# Patient Record
Sex: Male | Born: 1935
Health system: Southern US, Community
[De-identification: ages and names within clinical notes are randomized; demographics above are authoritative.]

## PROBLEM LIST (undated history)

## (undated) DIAGNOSIS — Z950 Presence of cardiac pacemaker: Secondary | ICD-10-CM

## (undated) DIAGNOSIS — E162 Hypoglycemia, unspecified: Secondary | ICD-10-CM

## (undated) DIAGNOSIS — N183 Chronic kidney disease, stage 3 (moderate): Secondary | ICD-10-CM

## (undated) DIAGNOSIS — D649 Anemia, unspecified: Secondary | ICD-10-CM

## (undated) DIAGNOSIS — K922 Gastrointestinal hemorrhage, unspecified: Secondary | ICD-10-CM

## (undated) DIAGNOSIS — I480 Paroxysmal atrial fibrillation: Secondary | ICD-10-CM

## (undated) DIAGNOSIS — C801 Malignant (primary) neoplasm, unspecified: Secondary | ICD-10-CM

## (undated) DIAGNOSIS — K219 Gastro-esophageal reflux disease without esophagitis: Secondary | ICD-10-CM

## (undated) DIAGNOSIS — C4490 Unspecified malignant neoplasm of skin, unspecified: Secondary | ICD-10-CM

## (undated) DIAGNOSIS — E538 Deficiency of other specified B group vitamins: Secondary | ICD-10-CM

## (undated) DIAGNOSIS — R2689 Other abnormalities of gait and mobility: Secondary | ICD-10-CM

## (undated) DIAGNOSIS — E039 Hypothyroidism, unspecified: Secondary | ICD-10-CM

## (undated) DIAGNOSIS — E785 Hyperlipidemia, unspecified: Secondary | ICD-10-CM

## (undated) DIAGNOSIS — Z8546 Personal history of malignant neoplasm of prostate: Secondary | ICD-10-CM

## (undated) DIAGNOSIS — K627 Radiation proctitis: Secondary | ICD-10-CM

## (undated) DIAGNOSIS — I499 Cardiac arrhythmia, unspecified: Secondary | ICD-10-CM

## (undated) DIAGNOSIS — K279 Peptic ulcer, site unspecified, unspecified as acute or chronic, without hemorrhage or perforation: Secondary | ICD-10-CM

## (undated) DIAGNOSIS — K5730 Diverticulosis of large intestine without perforation or abscess without bleeding: Secondary | ICD-10-CM

## (undated) DIAGNOSIS — H409 Unspecified glaucoma: Secondary | ICD-10-CM

## (undated) DIAGNOSIS — M47812 Spondylosis without myelopathy or radiculopathy, cervical region: Secondary | ICD-10-CM

## (undated) DIAGNOSIS — K52 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: Secondary | ICD-10-CM

## (undated) DIAGNOSIS — Z95810 Presence of automatic (implantable) cardiac defibrillator: Secondary | ICD-10-CM

## (undated) DIAGNOSIS — R600 Localized edema: Secondary | ICD-10-CM

## (undated) DIAGNOSIS — I495 Sick sinus syndrome: Secondary | ICD-10-CM

## (undated) DIAGNOSIS — I10 Essential (primary) hypertension: Secondary | ICD-10-CM

## (undated) DIAGNOSIS — I700 Atherosclerosis of aorta: Secondary | ICD-10-CM

## (undated) DIAGNOSIS — S72001A Fracture of unspecified part of neck of right femur, initial encounter for closed fracture: Secondary | ICD-10-CM

## (undated) HISTORY — PX: OTHER SURGICAL HISTORY: SHX169

## (undated) HISTORY — PX: CATARACT EXTRACTION: SUR2

## (undated) HISTORY — DX: Hyperlipidemia, unspecified: E78.5

## (undated) HISTORY — PX: SKIN CANCER DESTRUCTION: SHX778

## (undated) HISTORY — DX: Radiation proctitis: K62.7

## (undated) HISTORY — DX: Gastro-esophageal reflux disease without esophagitis: K21.9

## (undated) HISTORY — PX: CARDIAC CATHETERIZATION: SHX172

## (undated) HISTORY — DX: Deficiency of other specified B group vitamins: E53.8

## (undated) HISTORY — DX: Diverticulosis of large intestine without perforation or abscess without bleeding: K57.30

## (undated) HISTORY — DX: Presence of cardiac pacemaker: Z95.0

## (undated) HISTORY — PX: TOOTH EXTRACTION: SUR596

## (undated) HISTORY — DX: 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: K52.0

## (undated) HISTORY — DX: Hypothyroidism, unspecified: E03.9

## (undated) HISTORY — PX: CARDIAC ELECTROPHYSIOLOGY MAPPING AND ABLATION: SHX1292

## (undated) HISTORY — DX: Chronic kidney disease, stage 3 (moderate): N18.3

## (undated) HISTORY — PX: HERNIA REPAIR: SHX51

## (undated) HISTORY — PX: PROSTATE SURGERY: SHX751

## (undated) HISTORY — DX: Essential (primary) hypertension: I10

## (undated) HISTORY — DX: Spondylosis without myelopathy or radiculopathy, cervical region: M47.812

## (undated) HISTORY — DX: Peptic ulcer, site unspecified, unspecified as acute or chronic, without hemorrhage or perforation: K27.9

## (undated) HISTORY — DX: Gastrointestinal hemorrhage, unspecified: K92.2

## (undated) HISTORY — DX: Personal history of malignant neoplasm of prostate: Z85.46

## (undated) HISTORY — PX: NOSE SURGERY: SHX723

## (undated) HISTORY — DX: Sick sinus syndrome: I49.5

## (undated) HISTORY — DX: Unspecified glaucoma: H40.9

## (undated) HISTORY — DX: Fracture of unspecified part of neck of right femur, initial encounter for closed fracture: S72.001A

## (undated) HISTORY — DX: Paroxysmal atrial fibrillation: I48.0

---

## 1973-03-30 DIAGNOSIS — K279 Peptic ulcer, site unspecified, unspecified as acute or chronic, without hemorrhage or perforation: Secondary | ICD-10-CM

## 1973-03-30 HISTORY — DX: Peptic ulcer, site unspecified, unspecified as acute or chronic, without hemorrhage or perforation: K27.9

## 1999-06-21 ENCOUNTER — Encounter: Payer: Self-pay | Admitting: Emergency Medicine

## 1999-06-21 ENCOUNTER — Inpatient Hospital Stay (HOSPITAL_COMMUNITY): Admission: EM | Admit: 1999-06-21 | Discharge: 1999-06-23 | Payer: Self-pay | Admitting: *Deleted

## 1999-06-26 ENCOUNTER — Ambulatory Visit (HOSPITAL_COMMUNITY): Admission: RE | Admit: 1999-06-26 | Discharge: 1999-06-27 | Payer: Self-pay | Admitting: Psychiatry

## 1999-11-28 ENCOUNTER — Encounter: Admission: RE | Admit: 1999-11-28 | Discharge: 1999-11-28 | Payer: Self-pay | Admitting: Surgery

## 1999-11-28 ENCOUNTER — Encounter: Payer: Self-pay | Admitting: Surgery

## 1999-12-02 ENCOUNTER — Ambulatory Visit (HOSPITAL_BASED_OUTPATIENT_CLINIC_OR_DEPARTMENT_OTHER): Admission: RE | Admit: 1999-12-02 | Discharge: 1999-12-03 | Payer: Self-pay | Admitting: Surgery

## 1999-12-02 ENCOUNTER — Encounter (INDEPENDENT_AMBULATORY_CARE_PROVIDER_SITE_OTHER): Payer: Self-pay | Admitting: Specialist

## 2000-12-28 DIAGNOSIS — C61 Malignant neoplasm of prostate: Secondary | ICD-10-CM

## 2000-12-28 DIAGNOSIS — Z8546 Personal history of malignant neoplasm of prostate: Secondary | ICD-10-CM

## 2000-12-28 HISTORY — DX: Malignant neoplasm of prostate: C61

## 2000-12-28 HISTORY — DX: Personal history of malignant neoplasm of prostate: Z85.46

## 2001-07-06 ENCOUNTER — Ambulatory Visit (HOSPITAL_COMMUNITY): Admission: RE | Admit: 2001-07-06 | Discharge: 2001-07-07 | Payer: Self-pay | Admitting: Internal Medicine

## 2001-08-31 ENCOUNTER — Ambulatory Visit (HOSPITAL_COMMUNITY): Admission: RE | Admit: 2001-08-31 | Discharge: 2001-08-31 | Payer: Self-pay | Admitting: Gastroenterology

## 2001-08-31 ENCOUNTER — Encounter: Payer: Self-pay | Admitting: Gastroenterology

## 2002-04-17 ENCOUNTER — Encounter: Admission: RE | Admit: 2002-04-17 | Discharge: 2002-04-17 | Payer: Self-pay | Admitting: Surgery

## 2002-04-17 ENCOUNTER — Encounter: Payer: Self-pay | Admitting: Surgery

## 2002-04-18 ENCOUNTER — Ambulatory Visit (HOSPITAL_BASED_OUTPATIENT_CLINIC_OR_DEPARTMENT_OTHER): Admission: RE | Admit: 2002-04-18 | Discharge: 2002-04-19 | Payer: Self-pay | Admitting: Surgery

## 2002-05-17 ENCOUNTER — Encounter: Payer: Self-pay | Admitting: Surgery

## 2002-05-17 ENCOUNTER — Encounter: Admission: RE | Admit: 2002-05-17 | Discharge: 2002-05-17 | Payer: Self-pay | Admitting: Surgery

## 2002-06-13 ENCOUNTER — Encounter: Payer: Self-pay | Admitting: Internal Medicine

## 2002-06-13 ENCOUNTER — Inpatient Hospital Stay (HOSPITAL_COMMUNITY): Admission: EM | Admit: 2002-06-13 | Discharge: 2002-06-21 | Payer: Self-pay | Admitting: Emergency Medicine

## 2003-12-29 ENCOUNTER — Ambulatory Visit: Payer: Self-pay | Admitting: Radiation Oncology

## 2004-01-17 ENCOUNTER — Encounter (INDEPENDENT_AMBULATORY_CARE_PROVIDER_SITE_OTHER): Payer: Self-pay | Admitting: Specialist

## 2004-01-17 ENCOUNTER — Ambulatory Visit (HOSPITAL_COMMUNITY): Admission: RE | Admit: 2004-01-17 | Discharge: 2004-01-17 | Payer: Self-pay | Admitting: Surgery

## 2004-01-17 ENCOUNTER — Ambulatory Visit (HOSPITAL_BASED_OUTPATIENT_CLINIC_OR_DEPARTMENT_OTHER): Admission: RE | Admit: 2004-01-17 | Discharge: 2004-01-17 | Payer: Self-pay | Admitting: Surgery

## 2004-01-17 ENCOUNTER — Encounter: Admission: RE | Admit: 2004-01-17 | Discharge: 2004-01-17 | Payer: Self-pay | Admitting: Surgery

## 2004-02-04 ENCOUNTER — Ambulatory Visit: Payer: Self-pay | Admitting: Cardiology

## 2004-03-04 ENCOUNTER — Ambulatory Visit: Payer: Self-pay | Admitting: Cardiology

## 2004-03-25 ENCOUNTER — Ambulatory Visit: Payer: Self-pay | Admitting: Internal Medicine

## 2004-04-08 ENCOUNTER — Ambulatory Visit: Payer: Self-pay | Admitting: Internal Medicine

## 2004-04-22 ENCOUNTER — Ambulatory Visit: Payer: Self-pay | Admitting: Cardiology

## 2004-05-20 ENCOUNTER — Ambulatory Visit: Payer: Self-pay | Admitting: *Deleted

## 2004-06-17 ENCOUNTER — Ambulatory Visit: Payer: Self-pay | Admitting: Internal Medicine

## 2004-07-15 ENCOUNTER — Ambulatory Visit: Payer: Self-pay | Admitting: Cardiology

## 2004-08-05 ENCOUNTER — Encounter: Admission: RE | Admit: 2004-08-05 | Discharge: 2004-08-05 | Payer: Self-pay | Admitting: Surgery

## 2004-08-05 ENCOUNTER — Ambulatory Visit: Payer: Self-pay | Admitting: Cardiology

## 2004-09-02 ENCOUNTER — Ambulatory Visit: Payer: Self-pay | Admitting: *Deleted

## 2004-09-29 ENCOUNTER — Observation Stay (HOSPITAL_COMMUNITY): Admission: RE | Admit: 2004-09-29 | Discharge: 2004-09-30 | Payer: Self-pay | Admitting: Surgery

## 2004-09-29 ENCOUNTER — Encounter (INDEPENDENT_AMBULATORY_CARE_PROVIDER_SITE_OTHER): Payer: Self-pay | Admitting: *Deleted

## 2004-10-10 ENCOUNTER — Ambulatory Visit: Payer: Self-pay | Admitting: Cardiology

## 2004-10-20 ENCOUNTER — Ambulatory Visit: Payer: Self-pay | Admitting: Cardiology

## 2004-11-03 ENCOUNTER — Ambulatory Visit: Payer: Self-pay | Admitting: Cardiology

## 2004-11-17 ENCOUNTER — Ambulatory Visit: Payer: Self-pay | Admitting: Cardiology

## 2004-11-18 ENCOUNTER — Encounter: Admission: RE | Admit: 2004-11-18 | Discharge: 2004-11-18 | Payer: Self-pay | Admitting: Family Medicine

## 2004-12-08 ENCOUNTER — Ambulatory Visit: Payer: Self-pay | Admitting: Internal Medicine

## 2004-12-12 ENCOUNTER — Ambulatory Visit: Payer: Self-pay | Admitting: Radiation Oncology

## 2004-12-28 ENCOUNTER — Ambulatory Visit: Payer: Self-pay | Admitting: Radiation Oncology

## 2004-12-29 ENCOUNTER — Ambulatory Visit: Payer: Self-pay | Admitting: Cardiology

## 2005-01-19 ENCOUNTER — Ambulatory Visit: Payer: Self-pay | Admitting: Internal Medicine

## 2005-02-02 ENCOUNTER — Ambulatory Visit: Payer: Self-pay | Admitting: Internal Medicine

## 2005-02-05 ENCOUNTER — Ambulatory Visit: Payer: Self-pay | Admitting: Gastroenterology

## 2005-02-16 ENCOUNTER — Ambulatory Visit: Payer: Self-pay | Admitting: Cardiology

## 2005-03-02 ENCOUNTER — Ambulatory Visit: Payer: Self-pay | Admitting: Cardiology

## 2005-03-25 ENCOUNTER — Ambulatory Visit: Payer: Self-pay | Admitting: *Deleted

## 2005-04-22 ENCOUNTER — Ambulatory Visit: Payer: Self-pay | Admitting: Cardiology

## 2005-05-06 ENCOUNTER — Ambulatory Visit: Payer: Self-pay | Admitting: Internal Medicine

## 2005-05-20 ENCOUNTER — Ambulatory Visit: Payer: Self-pay | Admitting: Cardiology

## 2005-06-17 ENCOUNTER — Ambulatory Visit: Payer: Self-pay | Admitting: Cardiology

## 2005-07-15 ENCOUNTER — Ambulatory Visit: Payer: Self-pay | Admitting: *Deleted

## 2005-07-28 ENCOUNTER — Encounter: Admission: RE | Admit: 2005-07-28 | Discharge: 2005-07-28 | Payer: Self-pay | Admitting: Surgery

## 2005-07-28 DIAGNOSIS — Z950 Presence of cardiac pacemaker: Secondary | ICD-10-CM

## 2005-07-28 HISTORY — PX: INSERT / REPLACE / REMOVE PACEMAKER: SUR710

## 2005-07-28 HISTORY — DX: Presence of cardiac pacemaker: Z95.0

## 2005-07-29 ENCOUNTER — Encounter (INDEPENDENT_AMBULATORY_CARE_PROVIDER_SITE_OTHER): Payer: Self-pay | Admitting: *Deleted

## 2005-07-29 ENCOUNTER — Ambulatory Visit (HOSPITAL_BASED_OUTPATIENT_CLINIC_OR_DEPARTMENT_OTHER): Admission: RE | Admit: 2005-07-29 | Discharge: 2005-07-29 | Payer: Self-pay | Admitting: Surgery

## 2005-08-05 ENCOUNTER — Ambulatory Visit: Payer: Self-pay | Admitting: Cardiology

## 2005-08-13 ENCOUNTER — Ambulatory Visit: Payer: Self-pay | Admitting: Cardiology

## 2005-08-19 ENCOUNTER — Inpatient Hospital Stay (HOSPITAL_COMMUNITY): Admission: EM | Admit: 2005-08-19 | Discharge: 2005-08-22 | Payer: Self-pay | Admitting: Emergency Medicine

## 2005-08-19 ENCOUNTER — Ambulatory Visit: Payer: Self-pay | Admitting: Cardiology

## 2005-08-20 ENCOUNTER — Encounter: Payer: Self-pay | Admitting: Internal Medicine

## 2005-09-01 ENCOUNTER — Ambulatory Visit: Payer: Self-pay | Admitting: Internal Medicine

## 2005-09-09 ENCOUNTER — Ambulatory Visit: Payer: Self-pay

## 2005-09-09 ENCOUNTER — Ambulatory Visit: Payer: Self-pay | Admitting: Cardiology

## 2005-09-23 ENCOUNTER — Ambulatory Visit: Payer: Self-pay | Admitting: Internal Medicine

## 2005-10-05 ENCOUNTER — Ambulatory Visit: Payer: Self-pay | Admitting: Cardiology

## 2005-10-26 ENCOUNTER — Ambulatory Visit: Payer: Self-pay | Admitting: Cardiovascular Disease

## 2005-11-02 ENCOUNTER — Ambulatory Visit: Payer: Self-pay | Admitting: Cardiology

## 2005-11-23 ENCOUNTER — Ambulatory Visit: Payer: Self-pay | Admitting: Cardiology

## 2005-12-11 ENCOUNTER — Ambulatory Visit: Payer: Self-pay | Admitting: Radiation Oncology

## 2005-12-21 ENCOUNTER — Ambulatory Visit: Payer: Self-pay | Admitting: Cardiology

## 2005-12-28 ENCOUNTER — Ambulatory Visit: Payer: Self-pay | Admitting: Radiation Oncology

## 2006-01-18 ENCOUNTER — Ambulatory Visit: Payer: Self-pay | Admitting: Cardiovascular Disease

## 2006-02-15 ENCOUNTER — Ambulatory Visit: Payer: Self-pay | Admitting: Cardiology

## 2006-03-15 ENCOUNTER — Ambulatory Visit: Payer: Self-pay | Admitting: Cardiology

## 2006-04-12 ENCOUNTER — Ambulatory Visit: Payer: Self-pay | Admitting: Cardiology

## 2006-05-10 ENCOUNTER — Ambulatory Visit: Payer: Self-pay | Admitting: Internal Medicine

## 2006-05-10 ENCOUNTER — Ambulatory Visit: Payer: Self-pay | Admitting: Cardiology

## 2006-06-07 ENCOUNTER — Ambulatory Visit: Payer: Self-pay | Admitting: Cardiology

## 2006-06-08 ENCOUNTER — Ambulatory Visit: Payer: Self-pay | Admitting: Gastroenterology

## 2006-07-05 ENCOUNTER — Ambulatory Visit: Payer: Self-pay | Admitting: Cardiology

## 2006-08-03 ENCOUNTER — Ambulatory Visit: Payer: Self-pay | Admitting: Cardiology

## 2006-08-31 ENCOUNTER — Ambulatory Visit: Payer: Self-pay | Admitting: Cardiology

## 2006-09-30 ENCOUNTER — Ambulatory Visit: Payer: Self-pay | Admitting: Internal Medicine

## 2006-11-04 ENCOUNTER — Ambulatory Visit: Payer: Self-pay | Admitting: Cardiovascular Disease

## 2006-11-04 ENCOUNTER — Ambulatory Visit: Payer: Self-pay | Admitting: Internal Medicine

## 2006-12-02 ENCOUNTER — Ambulatory Visit: Payer: Self-pay | Admitting: Cardiology

## 2006-12-16 ENCOUNTER — Ambulatory Visit: Payer: Self-pay | Admitting: Radiation Oncology

## 2006-12-29 ENCOUNTER — Ambulatory Visit: Payer: Self-pay | Admitting: Radiation Oncology

## 2006-12-30 ENCOUNTER — Ambulatory Visit: Payer: Self-pay | Admitting: Cardiology

## 2007-01-27 ENCOUNTER — Ambulatory Visit: Payer: Self-pay | Admitting: Internal Medicine

## 2007-02-16 ENCOUNTER — Ambulatory Visit: Payer: Self-pay

## 2007-03-16 ENCOUNTER — Ambulatory Visit: Payer: Self-pay | Admitting: Internal Medicine

## 2007-03-29 ENCOUNTER — Ambulatory Visit: Payer: Self-pay | Admitting: Cardiology

## 2007-04-08 ENCOUNTER — Ambulatory Visit: Payer: Self-pay | Admitting: Cardiology

## 2007-04-22 ENCOUNTER — Ambulatory Visit: Payer: Self-pay | Admitting: Internal Medicine

## 2007-05-23 ENCOUNTER — Ambulatory Visit: Payer: Self-pay | Admitting: Internal Medicine

## 2007-06-20 ENCOUNTER — Ambulatory Visit: Payer: Self-pay | Admitting: Cardiology

## 2007-07-07 ENCOUNTER — Ambulatory Visit: Payer: Self-pay | Admitting: Gastroenterology

## 2007-07-07 LAB — CONVERTED CEMR LAB
ALT: 27 units/L (ref 0–53)
AST: 28 units/L (ref 0–37)
Basophils Absolute: 0 10*3/uL (ref 0.0–0.1)
Basophils Relative: 0.1 % (ref 0.0–1.0)
Bilirubin, Direct: 0.2 mg/dL (ref 0.0–0.3)
CO2: 33 meq/L — ABNORMAL HIGH (ref 19–32)
Eosinophils Absolute: 0.2 10*3/uL (ref 0.0–0.7)
Folate: 8.3 ng/mL
Glucose, Bld: 76 mg/dL (ref 70–99)
Lymphocytes Relative: 19.9 % (ref 12.0–46.0)
MCV: 100.5 fL — ABNORMAL HIGH (ref 78.0–100.0)
Monocytes Absolute: 0.6 10*3/uL (ref 0.1–1.0)
Monocytes Relative: 11.1 % (ref 3.0–12.0)
Neutro Abs: 3.8 10*3/uL (ref 1.4–7.7)
Potassium: 4.8 meq/L (ref 3.5–5.1)
RDW: 13.2 % (ref 11.5–14.6)
Saturation Ratios: 25.6 % (ref 20.0–50.0)
Sodium: 142 meq/L (ref 135–145)
Total Protein: 6.5 g/dL (ref 6.0–8.3)
Vitamin B-12: 216 pg/mL (ref 211–911)

## 2007-07-18 ENCOUNTER — Ambulatory Visit: Payer: Self-pay | Admitting: Cardiology

## 2007-08-02 ENCOUNTER — Ambulatory Visit: Payer: Self-pay

## 2007-08-17 ENCOUNTER — Ambulatory Visit: Payer: Self-pay | Admitting: Cardiology

## 2007-09-19 ENCOUNTER — Ambulatory Visit: Payer: Self-pay | Admitting: Cardiology

## 2007-09-29 ENCOUNTER — Emergency Department: Payer: Self-pay | Admitting: Emergency Medicine

## 2007-10-20 ENCOUNTER — Ambulatory Visit: Payer: Self-pay | Admitting: Cardiology

## 2007-10-28 ENCOUNTER — Ambulatory Visit: Payer: Self-pay | Admitting: Internal Medicine

## 2007-10-29 DIAGNOSIS — C44101 Unspecified malignant neoplasm of skin of unspecified eyelid, including canthus: Secondary | ICD-10-CM

## 2007-10-29 HISTORY — DX: Unspecified malignant neoplasm of skin of unspecified eyelid, including canthus: C44.101

## 2007-11-04 ENCOUNTER — Ambulatory Visit: Payer: Self-pay | Admitting: Internal Medicine

## 2007-11-16 ENCOUNTER — Encounter: Payer: Self-pay | Admitting: Family Medicine

## 2007-11-18 ENCOUNTER — Ambulatory Visit: Payer: Self-pay | Admitting: Cardiovascular Disease

## 2007-11-21 ENCOUNTER — Ambulatory Visit: Payer: Self-pay | Admitting: Cardiology

## 2007-12-01 ENCOUNTER — Ambulatory Visit: Payer: Self-pay | Admitting: Family Medicine

## 2007-12-01 DIAGNOSIS — K469 Unspecified abdominal hernia without obstruction or gangrene: Secondary | ICD-10-CM | POA: Insufficient documentation

## 2007-12-01 DIAGNOSIS — E039 Hypothyroidism, unspecified: Secondary | ICD-10-CM | POA: Insufficient documentation

## 2007-12-06 LAB — CONVERTED CEMR LAB
BUN: 20 mg/dL (ref 6–23)
Chloride: 107 meq/L (ref 96–112)
GFR calc Af Amer: 75 mL/min
GFR calc non Af Amer: 62 mL/min
Glucose, Bld: 83 mg/dL (ref 70–99)
Potassium: 5.5 meq/L — ABNORMAL HIGH (ref 3.5–5.1)

## 2007-12-08 ENCOUNTER — Ambulatory Visit: Payer: Self-pay | Admitting: Family Medicine

## 2007-12-08 LAB — CONVERTED CEMR LAB
BUN: 13 mg/dL (ref 6–23)
Chloride: 107 meq/L (ref 96–112)
Creatinine, Ser: 1.2 mg/dL (ref 0.4–1.5)
GFR calc non Af Amer: 62 mL/min
Glucose, Bld: 94 mg/dL (ref 70–99)

## 2007-12-09 ENCOUNTER — Ambulatory Visit: Payer: Self-pay | Admitting: Cardiology

## 2007-12-30 ENCOUNTER — Ambulatory Visit: Payer: Self-pay | Admitting: Cardiovascular Disease

## 2008-01-06 ENCOUNTER — Ambulatory Visit: Payer: Self-pay | Admitting: Cardiology

## 2008-02-03 ENCOUNTER — Ambulatory Visit: Payer: Self-pay | Admitting: Cardiology

## 2008-02-06 ENCOUNTER — Ambulatory Visit: Payer: Self-pay | Admitting: Family Medicine

## 2008-02-06 DIAGNOSIS — I480 Paroxysmal atrial fibrillation: Secondary | ICD-10-CM | POA: Insufficient documentation

## 2008-02-06 DIAGNOSIS — I4891 Unspecified atrial fibrillation: Secondary | ICD-10-CM | POA: Insufficient documentation

## 2008-02-06 DIAGNOSIS — E785 Hyperlipidemia, unspecified: Secondary | ICD-10-CM | POA: Insufficient documentation

## 2008-02-06 LAB — CONVERTED CEMR LAB
LDL Cholesterol: 60 mg/dL (ref 0–99)
Triglycerides: 41 mg/dL (ref 0–149)

## 2008-03-02 ENCOUNTER — Ambulatory Visit: Payer: Self-pay | Admitting: Cardiology

## 2008-03-29 ENCOUNTER — Ambulatory Visit: Payer: Self-pay | Admitting: Cardiology

## 2008-04-02 ENCOUNTER — Ambulatory Visit: Payer: Self-pay | Admitting: Internal Medicine

## 2008-04-12 ENCOUNTER — Ambulatory Visit: Payer: Self-pay | Admitting: Internal Medicine

## 2008-05-09 ENCOUNTER — Ambulatory Visit: Payer: Self-pay | Admitting: Family Medicine

## 2008-05-09 DIAGNOSIS — I10 Essential (primary) hypertension: Secondary | ICD-10-CM | POA: Insufficient documentation

## 2008-05-10 ENCOUNTER — Ambulatory Visit: Payer: Self-pay | Admitting: Internal Medicine

## 2008-05-10 ENCOUNTER — Encounter (INDEPENDENT_AMBULATORY_CARE_PROVIDER_SITE_OTHER): Payer: Self-pay | Admitting: *Deleted

## 2008-05-10 LAB — CONVERTED CEMR LAB
ALT: 25 units/L (ref 0–53)
AST: 31 units/L (ref 0–37)
Alkaline Phosphatase: 103 units/L (ref 39–117)
Basophils Relative: 0.1 % (ref 0.0–3.0)
Bilirubin, Direct: 0.1 mg/dL (ref 0.0–0.3)
Calcium: 8.8 mg/dL (ref 8.4–10.5)
Chloride: 111 meq/L (ref 96–112)
Creatinine, Ser: 1.2 mg/dL (ref 0.4–1.5)
GFR calc Af Amer: 74 mL/min
Glucose, Bld: 98 mg/dL (ref 70–99)
Hemoglobin: 12.8 g/dL — ABNORMAL LOW (ref 13.0–17.0)
Lymphocytes Relative: 19.2 % (ref 12.0–46.0)
Monocytes Absolute: 0.6 10*3/uL (ref 0.1–1.0)
Neutro Abs: 4.1 10*3/uL (ref 1.4–7.7)
Neutrophils Relative %: 68.1 % (ref 43.0–77.0)
Platelets: 209 10*3/uL (ref 150–400)
Potassium: 4.6 meq/L (ref 3.5–5.1)
RBC: 3.77 M/uL — ABNORMAL LOW (ref 4.22–5.81)
Sodium: 143 meq/L (ref 135–145)

## 2008-06-06 ENCOUNTER — Ambulatory Visit: Payer: Self-pay | Admitting: Internal Medicine

## 2008-06-14 ENCOUNTER — Telehealth: Payer: Self-pay | Admitting: Family Medicine

## 2008-06-15 ENCOUNTER — Ambulatory Visit: Payer: Self-pay | Admitting: Cardiology

## 2008-06-18 ENCOUNTER — Encounter: Payer: Self-pay | Admitting: Family Medicine

## 2008-07-13 ENCOUNTER — Ambulatory Visit: Payer: Self-pay | Admitting: Internal Medicine

## 2008-07-13 ENCOUNTER — Encounter (INDEPENDENT_AMBULATORY_CARE_PROVIDER_SITE_OTHER): Payer: Self-pay | Admitting: *Deleted

## 2008-07-18 DIAGNOSIS — K52 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: Secondary | ICD-10-CM | POA: Insufficient documentation

## 2008-07-18 DIAGNOSIS — K5730 Diverticulosis of large intestine without perforation or abscess without bleeding: Secondary | ICD-10-CM | POA: Insufficient documentation

## 2008-07-18 DIAGNOSIS — K279 Peptic ulcer, site unspecified, unspecified as acute or chronic, without hemorrhage or perforation: Secondary | ICD-10-CM | POA: Insufficient documentation

## 2008-07-18 DIAGNOSIS — C61 Malignant neoplasm of prostate: Secondary | ICD-10-CM | POA: Insufficient documentation

## 2008-07-19 ENCOUNTER — Ambulatory Visit: Payer: Self-pay | Admitting: Gastroenterology

## 2008-07-19 DIAGNOSIS — E538 Deficiency of other specified B group vitamins: Secondary | ICD-10-CM | POA: Insufficient documentation

## 2008-07-20 ENCOUNTER — Encounter: Payer: Self-pay | Admitting: Gastroenterology

## 2008-07-20 LAB — CONVERTED CEMR LAB
Glucose, Bld: 92 mg/dL (ref 70–99)
Hgb A1c MFr Bld: 6.2 % (ref 4.6–6.5)

## 2008-07-27 ENCOUNTER — Ambulatory Visit: Payer: Self-pay | Admitting: Gastroenterology

## 2008-08-03 ENCOUNTER — Ambulatory Visit: Payer: Self-pay | Admitting: Gastroenterology

## 2008-08-06 ENCOUNTER — Encounter: Payer: Self-pay | Admitting: Gastroenterology

## 2008-08-07 DIAGNOSIS — Z950 Presence of cardiac pacemaker: Secondary | ICD-10-CM | POA: Insufficient documentation

## 2008-08-07 DIAGNOSIS — K219 Gastro-esophageal reflux disease without esophagitis: Secondary | ICD-10-CM | POA: Insufficient documentation

## 2008-08-07 DIAGNOSIS — H409 Unspecified glaucoma: Secondary | ICD-10-CM | POA: Insufficient documentation

## 2008-08-08 ENCOUNTER — Ambulatory Visit: Payer: Self-pay | Admitting: Cardiology

## 2008-08-30 ENCOUNTER — Ambulatory Visit: Payer: Self-pay | Admitting: Internal Medicine

## 2008-09-10 ENCOUNTER — Ambulatory Visit: Payer: Self-pay | Admitting: Family Medicine

## 2008-09-10 LAB — CONVERTED CEMR LAB
HDL goal, serum: 40 mg/dL
LDL Goal: 160 mg/dL

## 2008-09-11 LAB — CONVERTED CEMR LAB
Albumin: 3.5 g/dL (ref 3.5–5.2)
Basophils Absolute: 0 10*3/uL (ref 0.0–0.1)
CO2: 30 meq/L (ref 19–32)
Calcium: 8.8 mg/dL (ref 8.4–10.5)
Creatinine, Ser: 1.3 mg/dL (ref 0.4–1.5)
Eosinophils Relative: 2.1 % (ref 0.0–5.0)
Folate: 10.6 ng/mL
Free T4: 1.2 ng/dL (ref 0.6–1.6)
GFR calc non Af Amer: 55.97 mL/min (ref >60)
Glucose, Bld: 93 mg/dL (ref 70–99)
Hemoglobin: 12.3 g/dL — ABNORMAL LOW (ref 13.0–17.0)
MCV: 97.4 fL (ref 78.0–100.0)
Potassium: 4.7 meq/L (ref 3.5–5.1)
RBC: 3.66 M/uL — ABNORMAL LOW (ref 4.22–5.81)
RDW: 12.8 % (ref 11.5–14.6)
T4, Total: 8.4 ug/dL (ref 5.0–12.5)
TSH: 1.41 microintl units/mL (ref 0.35–5.50)
Total Bilirubin: 1.3 mg/dL — ABNORMAL HIGH (ref 0.3–1.2)
Total CHOL/HDL Ratio: 2
Total Protein: 6.6 g/dL (ref 6.0–8.3)
Triglycerides: 54 mg/dL (ref 0.0–149.0)
Vitamin B-12: 337 pg/mL (ref 211–911)
WBC: 7.1 10*3/uL (ref 4.5–10.5)

## 2008-09-12 ENCOUNTER — Encounter (INDEPENDENT_AMBULATORY_CARE_PROVIDER_SITE_OTHER): Payer: Self-pay | Admitting: *Deleted

## 2008-09-20 ENCOUNTER — Ambulatory Visit: Payer: Self-pay | Admitting: Cardiovascular Disease

## 2008-10-08 ENCOUNTER — Encounter: Payer: Self-pay | Admitting: Internal Medicine

## 2008-10-08 ENCOUNTER — Ambulatory Visit: Payer: Self-pay | Admitting: Internal Medicine

## 2008-10-11 ENCOUNTER — Ambulatory Visit: Payer: Self-pay | Admitting: Cardiology

## 2008-10-11 ENCOUNTER — Ambulatory Visit: Payer: Self-pay | Admitting: Family Medicine

## 2008-11-08 ENCOUNTER — Ambulatory Visit: Payer: Self-pay | Admitting: Cardiovascular Disease

## 2008-11-12 ENCOUNTER — Ambulatory Visit: Payer: Self-pay | Admitting: Family Medicine

## 2008-11-12 ENCOUNTER — Encounter: Payer: Self-pay | Admitting: *Deleted

## 2008-12-05 ENCOUNTER — Ambulatory Visit: Payer: Self-pay | Admitting: Cardiology

## 2008-12-05 LAB — CONVERTED CEMR LAB: POC INR: 2.7

## 2008-12-07 LAB — CONVERTED CEMR LAB: INR: 2.7 — ABNORMAL HIGH (ref 0.0–1.5)

## 2008-12-13 ENCOUNTER — Ambulatory Visit: Payer: Self-pay | Admitting: Family Medicine

## 2009-01-02 ENCOUNTER — Ambulatory Visit: Payer: Self-pay | Admitting: Cardiovascular Disease

## 2009-01-15 ENCOUNTER — Ambulatory Visit: Payer: Self-pay | Admitting: Family Medicine

## 2009-01-17 ENCOUNTER — Ambulatory Visit: Payer: Self-pay | Admitting: Internal Medicine

## 2009-01-17 LAB — CONVERTED CEMR LAB: POC INR: 2.9

## 2009-02-14 ENCOUNTER — Ambulatory Visit: Payer: Self-pay | Admitting: Cardiology

## 2009-02-15 ENCOUNTER — Ambulatory Visit: Payer: Self-pay | Admitting: Family Medicine

## 2009-03-05 ENCOUNTER — Ambulatory Visit: Payer: Self-pay | Admitting: Family Medicine

## 2009-03-06 LAB — CONVERTED CEMR LAB
CO2: 30 meq/L (ref 19–32)
Calcium: 9.2 mg/dL (ref 8.4–10.5)
Chloride: 110 meq/L (ref 96–112)
Eosinophils Relative: 3 % (ref 0.0–5.0)
Free T4: 1.2 ng/dL (ref 0.6–1.6)
GFR calc non Af Amer: 55.91 mL/min (ref >60)
HCT: 39.2 % (ref 39.0–52.0)
HDL: 74.1 mg/dL (ref >39.00)
Hemoglobin: 13.1 g/dL (ref 13.0–17.0)
LDL Cholesterol: 80 mg/dL (ref 0–99)
Lymphocytes Relative: 22.6 % (ref 12.0–46.0)
Lymphs Abs: 1.2 10*3/uL (ref 0.7–4.0)
MCHC: 33.5 g/dL (ref 30.0–36.0)
Monocytes Relative: 10.2 % (ref 3.0–12.0)
Potassium: 4.5 meq/L (ref 3.5–5.1)
RBC: 3.84 M/uL — ABNORMAL LOW (ref 4.22–5.81)
RDW: 13 % (ref 11.5–14.6)
TSH: 2.33 microintl units/mL (ref 0.35–5.50)
Total Bilirubin: 1 mg/dL (ref 0.3–1.2)
Total CHOL/HDL Ratio: 2
Triglycerides: 50 mg/dL (ref 0.0–149.0)
VLDL: 10 mg/dL (ref 0.0–40.0)
Vitamin B-12: 439 pg/mL (ref 211–911)
WBC: 5.5 10*3/uL (ref 4.5–10.5)

## 2009-03-13 ENCOUNTER — Ambulatory Visit: Payer: Self-pay | Admitting: Family Medicine

## 2009-03-18 ENCOUNTER — Ambulatory Visit: Payer: Self-pay | Admitting: Cardiovascular Disease

## 2009-03-27 ENCOUNTER — Ambulatory Visit: Payer: Self-pay | Admitting: Family Medicine

## 2009-04-08 ENCOUNTER — Ambulatory Visit: Payer: Self-pay | Admitting: Internal Medicine

## 2009-04-30 ENCOUNTER — Ambulatory Visit: Payer: Self-pay | Admitting: Family Medicine

## 2009-05-06 ENCOUNTER — Ambulatory Visit: Payer: Self-pay | Admitting: Internal Medicine

## 2009-05-06 LAB — CONVERTED CEMR LAB: POC INR: 3

## 2009-05-31 ENCOUNTER — Ambulatory Visit: Payer: Self-pay | Admitting: Family Medicine

## 2009-06-03 ENCOUNTER — Ambulatory Visit: Payer: Self-pay | Admitting: Cardiovascular Disease

## 2009-06-18 ENCOUNTER — Encounter: Payer: Self-pay | Admitting: Family Medicine

## 2009-06-18 ENCOUNTER — Telehealth (INDEPENDENT_AMBULATORY_CARE_PROVIDER_SITE_OTHER): Payer: Self-pay | Admitting: *Deleted

## 2009-07-01 ENCOUNTER — Encounter: Payer: Self-pay | Admitting: Cardiovascular Disease

## 2009-07-01 ENCOUNTER — Ambulatory Visit: Payer: Self-pay | Admitting: Cardiovascular Disease

## 2009-07-01 LAB — CONVERTED CEMR LAB: POC INR: 4.2

## 2009-07-02 ENCOUNTER — Ambulatory Visit: Payer: Self-pay | Admitting: Family Medicine

## 2009-07-16 ENCOUNTER — Ambulatory Visit: Payer: Self-pay | Admitting: Internal Medicine

## 2009-08-01 ENCOUNTER — Ambulatory Visit: Payer: Self-pay | Admitting: Family Medicine

## 2009-08-13 ENCOUNTER — Ambulatory Visit: Payer: Self-pay | Admitting: Cardiovascular Disease

## 2009-09-04 ENCOUNTER — Ambulatory Visit: Payer: Self-pay | Admitting: Family Medicine

## 2009-09-05 LAB — CONVERTED CEMR LAB
BUN: 18 mg/dL (ref 6–23)
Basophils Relative: 0.4 % (ref 0.0–3.0)
CO2: 28 meq/L (ref 19–32)
Creatinine, Ser: 1.2 mg/dL (ref 0.4–1.5)
Eosinophils Absolute: 0.2 10*3/uL (ref 0.0–0.7)
Eosinophils Relative: 3.5 % (ref 0.0–5.0)
HCT: 36.8 % — ABNORMAL LOW (ref 39.0–52.0)
Hemoglobin: 12.6 g/dL — ABNORMAL LOW (ref 13.0–17.0)
Lymphocytes Relative: 21.6 % (ref 12.0–46.0)
Lymphs Abs: 1.1 10*3/uL (ref 0.7–4.0)
MCHC: 34.1 g/dL (ref 30.0–36.0)
Monocytes Relative: 9.6 % (ref 3.0–12.0)
Potassium: 4 meq/L (ref 3.5–5.1)
RBC: 3.71 M/uL — ABNORMAL LOW (ref 4.22–5.81)
Sodium: 142 meq/L (ref 135–145)
TSH: 1.47 microintl units/mL (ref 0.35–5.50)

## 2009-09-10 ENCOUNTER — Ambulatory Visit: Payer: Self-pay | Admitting: Cardiovascular Disease

## 2009-09-10 LAB — CONVERTED CEMR LAB: POC INR: 2.1

## 2009-09-16 ENCOUNTER — Ambulatory Visit: Payer: Self-pay | Admitting: Family Medicine

## 2009-09-17 ENCOUNTER — Encounter: Payer: Self-pay | Admitting: Family Medicine

## 2009-09-17 ENCOUNTER — Ambulatory Visit: Payer: Self-pay | Admitting: Family Medicine

## 2009-10-04 ENCOUNTER — Ambulatory Visit: Payer: Self-pay | Admitting: Internal Medicine

## 2009-10-08 ENCOUNTER — Ambulatory Visit: Payer: Self-pay | Admitting: Family Medicine

## 2009-10-08 ENCOUNTER — Ambulatory Visit: Payer: Self-pay | Admitting: Cardiovascular Disease

## 2009-10-16 ENCOUNTER — Ambulatory Visit: Payer: Self-pay | Admitting: Cardiovascular Disease

## 2009-10-30 ENCOUNTER — Ambulatory Visit: Payer: Self-pay | Admitting: Cardiovascular Disease

## 2009-11-08 ENCOUNTER — Ambulatory Visit: Payer: Self-pay | Admitting: Family Medicine

## 2009-12-04 ENCOUNTER — Ambulatory Visit: Payer: Self-pay | Admitting: Cardiovascular Disease

## 2009-12-10 ENCOUNTER — Ambulatory Visit: Payer: Self-pay | Admitting: Family Medicine

## 2009-12-10 ENCOUNTER — Encounter (INDEPENDENT_AMBULATORY_CARE_PROVIDER_SITE_OTHER): Payer: Self-pay | Admitting: *Deleted

## 2010-01-01 ENCOUNTER — Ambulatory Visit: Payer: Self-pay | Admitting: Cardiovascular Disease

## 2010-01-10 ENCOUNTER — Ambulatory Visit: Payer: Self-pay | Admitting: Family Medicine

## 2010-01-16 ENCOUNTER — Encounter: Payer: Self-pay | Admitting: Family Medicine

## 2010-01-22 ENCOUNTER — Ambulatory Visit: Payer: Self-pay | Admitting: Cardiovascular Disease

## 2010-01-22 LAB — CONVERTED CEMR LAB: POC INR: 2.2

## 2010-01-24 ENCOUNTER — Encounter (INDEPENDENT_AMBULATORY_CARE_PROVIDER_SITE_OTHER): Payer: Self-pay | Admitting: *Deleted

## 2010-02-11 ENCOUNTER — Ambulatory Visit: Payer: Self-pay | Admitting: Family Medicine

## 2010-02-19 ENCOUNTER — Ambulatory Visit: Payer: Self-pay | Admitting: Cardiovascular Disease

## 2010-03-18 ENCOUNTER — Ambulatory Visit: Payer: Self-pay | Admitting: Family Medicine

## 2010-03-19 ENCOUNTER — Ambulatory Visit: Payer: Self-pay | Admitting: Cardiovascular Disease

## 2010-03-19 ENCOUNTER — Encounter: Payer: Self-pay | Admitting: Family Medicine

## 2010-03-19 ENCOUNTER — Ambulatory Visit: Payer: Self-pay | Admitting: Family Medicine

## 2010-03-19 LAB — CONVERTED CEMR LAB: POC INR: 2.5

## 2010-03-20 ENCOUNTER — Encounter: Payer: Self-pay | Admitting: Family Medicine

## 2010-03-20 LAB — CONVERTED CEMR LAB
ALT: 22 units/L (ref 0–53)
BUN: 23 mg/dL (ref 6–23)
Bilirubin, Direct: 0.2 mg/dL (ref 0.0–0.3)
CO2: 22 meq/L (ref 19–32)
Chloride: 108 meq/L (ref 96–112)
Eosinophils Absolute: 0.2 10*3/uL (ref 0.0–0.7)
Free T4: 1.29 ng/dL (ref 0.80–1.80)
Glucose, Bld: 94 mg/dL (ref 70–99)
Indirect Bilirubin: 0.6 mg/dL (ref 0.0–0.9)
LDL Cholesterol: 69 mg/dL (ref 0–99)
Lymphocytes Relative: 24 % (ref 12–46)
Lymphs Abs: 1.4 10*3/uL (ref 0.7–4.0)
MCV: 100.5 fL — ABNORMAL HIGH (ref 78.0–100.0)
Monocytes Relative: 10 % (ref 3–12)
Neutro Abs: 3.8 10*3/uL (ref 1.7–7.7)
Neutrophils Relative %: 63 % (ref 43–77)
Platelets: 237 10*3/uL (ref 150–400)
Potassium: 4.5 meq/L (ref 3.5–5.3)
RBC: 3.67 M/uL — ABNORMAL LOW (ref 4.22–5.81)
Sodium: 139 meq/L (ref 135–145)
TSH: 2.531 microintl units/mL (ref 0.350–4.500)
Total CHOL/HDL Ratio: 2.1
VLDL: 10 mg/dL (ref 0–40)
Vitamin B-12: >2000 pg/mL — ABNORMAL HIGH (ref 211–911)
WBC: 6.1 10*3/uL (ref 4.0–10.5)

## 2010-04-04 ENCOUNTER — Ambulatory Visit
Admission: RE | Admit: 2010-04-04 | Discharge: 2010-04-04 | Payer: Self-pay | Source: Home / Self Care | Attending: Internal Medicine | Admitting: Internal Medicine

## 2010-04-09 ENCOUNTER — Encounter: Payer: Self-pay | Admitting: Family Medicine

## 2010-04-16 ENCOUNTER — Ambulatory Visit: Admission: RE | Admit: 2010-04-16 | Discharge: 2010-04-16 | Payer: Self-pay | Source: Home / Self Care

## 2010-04-17 DIAGNOSIS — D509 Iron deficiency anemia, unspecified: Secondary | ICD-10-CM | POA: Insufficient documentation

## 2010-04-17 LAB — CONVERTED CEMR LAB
Ferritin: 128 ng/mL (ref 22–322)
Iron: 67 ug/dL (ref 42–165)
Transferrin: 255 mg/dL (ref 212–360)
UIBC: 231 ug/dL

## 2010-04-18 ENCOUNTER — Ambulatory Visit
Admission: RE | Admit: 2010-04-18 | Discharge: 2010-04-18 | Payer: Self-pay | Source: Home / Self Care | Attending: Family Medicine | Admitting: Family Medicine

## 2010-04-27 LAB — CONVERTED CEMR LAB: POC INR: 2.7

## 2010-04-29 NOTE — Miscellaneous (Signed)
Summary: dx correction   Clinical Lists Changes  Problems: Changed problem from PACEMAKER (ICD-V45.Marland Kitchen01) to PACEMAKER, PERMANENT (ICD-V45.01)  changed the incorrect dx code to correct dx code

## 2010-04-29 NOTE — Assessment & Plan Note (Signed)
Summary: [name] [name] B12/RBH   Nurse Visit   Allergies: 1)  ! Penicillin  Medication Administration  Injection # 1:    Medication: Vit B12 1000 mcg    Diagnosis: B12 DEFICIENCY (ICD-266.2)    Route: IM    Site: L deltoid    Exp Date: 03/30/2011    Lot #: [id number]    Mfr: [place]    Patient tolerated injection without complications    Given by: Christena Deem CMA ([place]) (September 04, 2009 8:53 AM)  Orders Added: 1)  Admin of Therapeutic Inj  intramuscular or subcutaneous [96372] 2)  Vit B12 1000 mcg [J3420]   Medication Administration  Injection # 1:    Medication: Vit B12 1000 mcg    Diagnosis: B12 DEFICIENCY (ICD-266.2)    Route: IM    Site: L deltoid    Exp Date: 03/30/2011    Lot #: [id number]    Mfr: [place]    Patient tolerated injection without complications    Given by: Christena Deem CMA ([place]) (September 04, 2009 8:53 AM)  Orders Added: 1)  Admin of Therapeutic Inj  intramuscular or subcutaneous [96372] 2)  Vit B12 1000 mcg [id number]

## 2010-04-29 NOTE — Medication Information (Signed)
Summary: CCR/AMD   Anticoagulant Therapy  Managed by: Alinda Deem, PharmD, BCPS, CPP Referring MD: Irish Lack MD PCP: Cecilio Asper Supervising MD: Caryl Comes MD, Remo Lipps Indication 1: Atrial Fibrillation (ICD-427.31) Indication 2: Atrial Flutter (ICD-427.32) Lab Used: [facility] Site: [place] INR POC 3.2 INR RANGE 2.0-3.0  Dietary changes: no    Health status changes: no    Bleeding/hemorrhagic complications: no    Recent/future hospitalizations: no    Any changes in medication regimen? no    Recent/future dental: no  Any missed doses?: no       Is patient compliant with meds? yes       Allergies: 1)  ! Penicillin  Anticoagulation Management History:      The patient is taking warfarin and comes in today for a routine follow up visit.  Positive risk factors for bleeding include an age of 75 years or older.  Negative risk factors for bleeding include no history of CVA/TIA.  The bleeding index is 'intermediate risk'.  Positive CHADS2 values include History of HTN and Age > 54 years old.  Negative CHADS2 values include History of Diabetes and Prior Stroke/CVA/TIA.  The start date was 04/12/2003.  His last INR was 2.7.  Anticoagulation responsible provider: Caryl Comes MD, Remo Lipps.  INR POC: 3.2.    Anticoagulation Management Assessment/Plan:      The patient's current anticoagulation dose is Warfarin sodium 4 mg tabs: take 1/2 a tablet every day.  The target INR is 2 - 3.  The next INR is due 07/01/2009.  Anticoagulation instructions were given to patient.  Results were reviewed/authorized by Alinda Deem, PharmD, BCPS, CPP.  He was notified by Gabriel Cirri, RN, BSN.         Prior Anticoagulation Instructions: The patient is to continue with the same dose of coumadin.  This dosage includes: coumadin 2 mg daily  Current Anticoagulation Instructions: no coumadin today then resume coumadin 2 mg daily

## 2010-04-29 NOTE — Medication Information (Signed)
Summary: CCR/NEE  Anticoagulant Therapy  Managed by: Freddrick March, RN, BSN Referring MD: Irish Lack MD PCP: Cecilio Asper Supervising MD: Rockey Situ Indication 1: Atrial Fibrillation (ICD-427.31) Indication 2: Atrial Flutter (ICD-427.32) Lab Used: [facility]  Site:  INR POC 2.2 INR RANGE 2.0-3.0  Dietary changes: no    Health status changes: no    Bleeding/hemorrhagic complications: no    Recent/future hospitalizations: no    Any changes in medication regimen? no    Recent/future dental: no  Any missed doses?: no       Is patient compliant with meds? yes       Allergies: 1)  ! Penicillin  Anticoagulation Management History:      The patient is taking warfarin and comes in today for a routine follow up visit.  Positive risk factors for bleeding include an age of 75 years or older.  Negative risk factors for bleeding include no history of CVA/TIA.  The bleeding index is 'intermediate risk'.  Positive CHADS2 values include History of HTN and Age > 59 years old.  Negative CHADS2 values include History of Diabetes and Prior Stroke/CVA/TIA.  The start date was 04/12/2003.  His last INR was 2.7.  Anticoagulation responsible provider: Gollan.  INR POC: 2.2.  Cuvette Lot#: [id number].  Exp: 11/2010.    Anticoagulation Management Assessment/Plan:      The patient's current anticoagulation dose is Warfarin sodium 4 mg tabs: take 1/2 a tablet every day.  The target INR is 2 - 3.  The next INR is due 10/30/2009.  Anticoagulation instructions were given to patient.  Results were reviewed/authorized by Freddrick March, RN, BSN.  He was notified by Freddrick March RN.         Prior Anticoagulation Instructions: INR 3.7  Do not take coumadin today. Start taking 1 tab daily except for 1/2 tab on Tuesday, Thursday and Saturday. Recheck in 10 days. The patient is to stop coumadin.    Current Anticoagulation Instructions: INR 2.2  Continue on  same dosage 1 tablet daily except 1/2 tablet on Tuesdays, Thursdays, and Saturdays.  Recheck in 2 weeks.

## 2010-04-29 NOTE — Assessment & Plan Note (Signed)
Summary: B/12/COPLAND/JRR   Nurse Visit   Allergies: 1)  ! Penicillin  Medication Administration  Injection # 1:    Medication: Vit B12 1000 mcg    Diagnosis: B12 DEFICIENCY (ICD-266.2)    Route: IM    Site: R deltoid    Exp Date: 08/29/2011    Lot #: [id number]    Mfr: [place]    Patient tolerated injection without complications    Given by: Emelia Salisbury LPN (November 15, 624THL 9:35 AM)  Orders Added: 1)  Vit B12 1000 mcg [J3420] 2)  Admin of Therapeutic Inj  intramuscular or subcutaneous [96372]   Medication Administration  Injection # 1:    Medication: Vit B12 1000 mcg    Diagnosis: B12 DEFICIENCY (ICD-266.2)    Route: IM    Site: R deltoid    Exp Date: 08/29/2011    Lot #: [id number]    Mfr: [employer]    Patient tolerated injection without complications    Given by: Emelia Salisbury LPN (November 15, 624THL 9:35 AM)  Orders Added: 1)  Vit B12 1000 mcg [J3420] 2)  Admin of Therapeutic Inj  intramuscular or subcutaneous [id number]

## 2010-04-29 NOTE — Assessment & Plan Note (Signed)
Summary: B-12/TOWER   Nurse Visit   Allergies: 1)  ! Penicillin  Medication Administration  Injection # 1:    Medication: Vit B12 1000 mcg    Diagnosis: B12 DEFICIENCY (ICD-266.2)    Route: IM    Site: R deltoid    Exp Date: 02/28/2011    Lot #: [id number]    Mfr: [place]    Patient tolerated injection without complications    Given by: Emelia Salisbury LPN (September 13, 624THL 9:17 AM)  Orders Added: 1)  Vit B12 1000 mcg [J3420] 2)  Admin of Therapeutic Inj  intramuscular or subcutaneous [[id number]]   Medication Administration  Injection # 1:    Medication: Vit B12 1000 mcg    Diagnosis: B12 DEFICIENCY (ICD-266.2)    Route: IM    Site: R deltoid    Exp Date: 02/28/2011    Lot #: [id number]    Mfr: [employer]    Patient tolerated injection without complications    Given by: Emelia Salisbury LPN (September 13, 624THL 9:17 AM)  Orders Added: 1)  Vit B12 1000 mcg [J3420] 2)  Admin of Therapeutic Inj  intramuscular or subcutaneous [id number]

## 2010-04-29 NOTE — Assessment & Plan Note (Signed)
Summary: B12 SHOT/DLO   Nurse Visit   Allergies: 1)  ! Penicillin  Medication Administration  Injection # 1:    Medication: Vit B12 1000 mcg    Diagnosis: B12 DEFICIENCY (ICD-266.2)    Route: IM    Site: R deltoid    Exp Date: 12/29/2010    Lot #: [id number]    Mfr: [employer]    Patient tolerated injection without complications    Given by: Sherrian Divers CMA ([place]) (April 30, 2009 10:11 AM)  Orders Added: 1)  Vit B12 1000 mcg [J3420] 2)  Admin of Therapeutic Inj  intramuscular or subcutaneous [id number]

## 2010-04-29 NOTE — Medication Information (Signed)
Summary: [facility]   Anticoagulant Therapy  Managed by: Alinda Deem, PharmD, BCPS, CPP Referring MD: Irish Lack MD PCP: Cecilio Asper Supervising MD: Rockey Situ Indication 1: Atrial Fibrillation (ICD-427.31) Indication 2: Atrial Flutter (ICD-427.32) Lab Used: [facility] Site: [place] INR POC 4.2 INR RANGE 2.0-3.0  Dietary changes: no    Health status changes: no    Bleeding/hemorrhagic complications: no    Recent/future hospitalizations: no    Any changes in medication regimen? no    Recent/future dental: no  Any missed doses?: no       Is patient compliant with meds? yes       Allergies: 1)  ! Penicillin  Anticoagulation Management History:      Positive risk factors for bleeding include an age of 75 years or older.  Negative risk factors for bleeding include no history of CVA/TIA.  The bleeding index is 'intermediate risk'.  Positive CHADS2 values include History of HTN and Age > 75 years old.  Negative CHADS2 values include History of Diabetes and Prior Stroke/CVA/TIA.  The start date was 04/12/2003.  His last INR was 2.7.  Anticoagulation responsible provider: gollan.  INR POC: 4.2.    Anticoagulation Management Assessment/Plan:      The patient's current anticoagulation dose is Warfarin sodium 4 mg tabs: take 1/2 a tablet every day.  The target INR is 2 - 3.  The next INR is due 07/15/2009.  Anticoagulation instructions were given to patient.  Results were reviewed/authorized by Alinda Deem, PharmD, BCPS, CPP.  He was notified by Gabriel Cirri, RN, BSN.         Prior Anticoagulation Instructions: no coumadin today then resume coumadin 2 mg daily  Current Anticoagulation Instructions: no coumadin today then coumadin 2 mg daily with 1 mg on Tues  and Thurs

## 2010-04-29 NOTE — Assessment & Plan Note (Signed)
Summary: F1Y/CCR/AMD      Allergies Added:   Visit Type:  Follow-up Referring Provider:  n/a Primary Provider:  Frederico Hamman Copeland,MD  CC:  edema occasionally, no cp, and no sob.  History of Present Illness: [name] [name] returns today for followup of atrial fibrillation and bradycardia s/p PPM.  The patient has done quite well in the past year.  He denies c/p, sob, or peripheral edema.  He admits to dietary indiscretion with sodium but has not had much trouble with CHF.  No symptomatic atrial fibrillation. No syncope.  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 1 Each Day 30 Minutes Before Meal 2)  Warfarin Sodium 4 Mg Tabs (Warfarin Sodium) .... Take 1/2 A Tablet Every Day 3)  Amiodarone Hcl 200 Mg Tabs (Amiodarone Hcl) .... Take One Tablet By Mouth Every Day Except Sat and Sun A Half Tablet 4)  Synthroid 100 Mcg Tabs (Levothyroxine Sodium) .... Take A Half Tablet Daily 5)  Fosamax 70 Mg Tabs (Alendronate Sodium) .... Take One Tablet Weekly 6)  Demadex 20 Mg Tabs (Torsemide) .... Take One Tablet Daily 7)  Ferrous Sulfate 325 (65 Fe) Mg Tabs (Ferrous  Sulfate) .... Take One Tablet Daily 8)  Dorzolamide-Timolol 2-0.5 % Soln (Dorzolamide-Timolol) .Marland Kitchen.. 1 Gtt in Both Eyes Two Times A Day 9)  Centrum Silver  Tabs (Multiple Vitamins-Minerals) .Marland Kitchen.. 1 Tablet By Mouth Once Daily 10)  Benefiber  Powd (Wheat Dextrin) .Marland Kitchen.. 1 Tbs As Needed 11)  Amlodipine Besylate 2.5 Mg Tabs (Amlodipine Besylate) .Marland Kitchen.. 1 By Mouth Daily 12)  Pravastatin Sodium 40 Mg  Tabs (Pravastatin Sodium) .... One By Mouth At Bedtime  Allergies (verified): 1)  ! 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 Phillip Heal PAROXYSMAL ATRIAL FIBRILLATION (ICD-427.31) PACEMAKER (ICD-V45.Marland Kitchen01) HYPERLIPIDEMIA (ICD-272.4) COUMADIN THERAPY (ICD-V58.61) HYPERTENSION, ESSENTIAL NOS (ICD-401.9) GERD (ICD-530.81) GLAUCOMA (ICD-365.9) B12 DEFICIENCY (ICD-266.2) PEPTIC ULCER DISEASE (ICD-533.90) PROSTATE CANCER, HX OF (ICD-V10.46) -- 5 weeks radiation (10/2000), radiation seeds (12/2000) - Dr. Bernardo Heater DIVERTICULOSIS, COLON (ICD-562.10) [name] ABD CAV W/O MENTION OBST/GANGREN (ICD-553.9) HYPOGLYCEMIA, UNSPECIFIED (ICD-251.2) HYPOTHYROIDISM (ICD-244.9)   Cardiology - Dr. Cristopher Peru, [place] GI - Dr. Verl Blalock, [place] Opthalmology - Dr. Clemetine Marker, [place] [facility], Dr. John Giovanni Dermatology - Dr. Phillip Heal  Dental - Dr. Eugenie Birks General Surgery - Dr. Hassell Done [facility]) [facility]  Past Surgical History: Last updated: 07/19/2008 Pacemaker, 07/2005 - Dr. Cristopher Peru, [place] Inguinal hernia  repair - 11/2001 (req. Wound-Vac), drainage post surgery with mesh, 09/2004, 09/2007 - mesh removal Laparascopic hernia repair - 1994 Sebaceous cyst removal - 10/2007  - Dr.  Clemetine Marker, L eye Cardiac ablation - 2001, 2003 Cataract Surgery - 1998 (left) Unilateral inguinal hernia repair - 11/1999 TEE and cardiac catheter ablation - 05/1999, 06/2001 Colonoscopy and Endoscopy - 08/2001, for rectal bleeding Basel cell carcinomas removed from left side of face   Review of Systems  The patient denies chest pain, syncope, dyspnea on exertion, and peripheral edema.    Vital Signs:  Patient profile:   77 year old male Height:      71.5 inches Weight:      175.50 pounds Pulse rate:   66 / minute Pulse rhythm:   regular BP sitting:   128 / 70  (left arm) Cuff size:   regular  Vitals Entered By: Philemon Kingdom (April 08, 2009 10:32 AM)  Physical Exam  General:  Well developed, well nourished, no acute distress.healthy appearing.   Head:  Normocephalic and atraumatic.no abnormalities observed and no abnormalities palpated.   Eyes:  vision grossly intact.   Mouth:  pharynx pink and moist, no erythema, and no exudates.   Neck:  No deformities, masses, or tenderness noted. Chest Wall:  Well healed PPM incsion. Lungs:  Clear throughout to auscultation.no intercostal retractions, no accessory muscle use, and normal breath sounds.   Heart:  Regular rate and rhythm; no murmurs, rubs,  or bruits. Abdomen:  Bowel sounds positive,abdomen soft and non-tender without masses, organomegaly   there is an external area of drainage in the L LQ with minimal drainage, no redness. no pain   Msk:  Symmetrical with no gross deformities. Normal posture. Extremities:  no c/c/e Neurologic:  alert & oriented X3 and gait normal.     EKG  Procedure date:  04/08/2009  Findings:      Normal sinus rhythm with rate of: 66. Atrium is paced.    PPM Specifications Following MD:  Cristopher Peru, MD     PPM Vendor:  [employer]     PPM Model Number:  [id number]     PPM Serial Number:  [id number] PPM DOI:  08/21/2005      Lead 1    Location: [facility]     DOI: 08/21/2005     Model #: [id number]      Serial #: [id number]     Status: active Lead 2    Location: RV     DOI: 08/21/2005     Model #: [id number]     Serial #: [id number]     Status: active   Indications:  SICK SINUS SYNDROME    PPM Follow Up Remote Check?  No Battery Voltage:  2.99 V     Pacer Dependent:  No       PPM Device Measurements Atrium  Amplitude: 1.2 mV, Impedance: 416 ohms, Threshold: 1.0 V at 0.4 msec Right Ventricle  Amplitude: 4.4 mV, Impedance: 520 ohms, Threshold: 1.0 V at 0.4 msec  Episodes MS Episodes:  63     Percent Mode Switch:  0.8%     Coumadin:  Yes Ventricular High Rate:  0     Atrial Pacing:  99.1%     Ventricular Pacing:  0.5%  Parameters Mode:  DDDR+     Lower Rate Limit:  60     Upper Rate Limit:  130 Paced AV Delay:  300     Sensed AV Delay:  300 Next Cardiology Appt  Due:  09/27/2009 Tech Comments:  No parameter changes.  Device function normal.  V-rates controlled during A-fib, the longes episode was 5 hours, + coumadin.  ROV 6 months in the [place] clinic. Alma Friendly, LPN  January 10, 624THL 10:40 AM  MD Comments:  Agree with above.  Impression & Recommendations:  Problem # 1:  PAROXYSMAL ATRIAL FIBRILLATION (ICD-427.31) He has mostly maintained NSR since his last PPM check.  He will contrinue his current meds. His updated medication list for this problem includes:    Warfarin Sodium 4 Mg Tabs (Warfarin sodium) .Marland Kitchen... Take 1/2 a tablet every day    Amiodarone Hcl 200 Mg Tabs (Amiodarone hcl) .Marland Kitchen... Take one tablet by mouth every day except sat and sun a half tablet  Problem # 2:  PACEMAKER (ICD-V45.Marland Kitchen01) His device is working normally today.  Will followup in several months.  Problem # 3:  COUMADIN THERAPY (ICD-V58.61) He has been therapeutic.  Check level today.

## 2010-04-29 NOTE — Miscellaneous (Signed)
  Medications Added VITAMIN B-12 1000 MCG/15ML LIQD (CYANOCOBALAMIN) one injection monthly       Clinical Lists Changes  Medications: Added new medication of VITAMIN B-12 1000 MCG/15ML LIQD (CYANOCOBALAMIN) one injection monthly     Prior Medications: PROTONIX 40 MG  TBEC (PANTOPRAZOLE SODIUM) 1 each day 30 minutes before meal WARFARIN SODIUM 4 MG TABS (WARFARIN SODIUM) take 1/2 a tablet every day AMIODARONE HCL 200 MG TABS (AMIODARONE HCL) take one tablet by mouth every day except Sat and Sun a half tablet SYNTHROID 100 MCG TABS (LEVOTHYROXINE SODIUM) take a half tablet daily FOSAMAX 70 MG TABS (ALENDRONATE SODIUM) take one tablet weekly DEMADEX 20 MG TABS (TORSEMIDE) take one tablet daily FERROUS SULFATE 325 (65 FE) MG TABS (FERROUS SULFATE) take one tablet daily DORZOLAMIDE-TIMOLOL 2-0.5 % SOLN (DORZOLAMIDE-TIMOLOL) 1 gtt in both eyes two times a day CENTRUM SILVER  TABS (MULTIPLE VITAMINS-MINERALS) 1 tablet by mouth once daily BENEFIBER  POWD (WHEAT DEXTRIN) 1 tbs as needed AMLODIPINE BESYLATE 2.5 MG TABS (AMLODIPINE BESYLATE) 1 by mouth daily PRAVASTATIN SODIUM 40 MG  TABS (PRAVASTATIN SODIUM) one by mouth at bedtime Current Allergies: ! PENICILLIN

## 2010-04-29 NOTE — Medication Information (Signed)
Summary: CCR/AMD  Anticoagulant Therapy  Managed by: Freddrick March, RN, BSN Referring MD: Irish Lack MD PCP: Cecilio Asper Supervising MD: Rockey Situ Indication 1: Atrial Fibrillation (ICD-427.31) Indication 2: Atrial Flutter (ICD-427.32) Lab Used: [facility] Site: [place] INR POC 2.5 INR RANGE 2.0-3.0           Allergies: 1)  ! Penicillin  Anticoagulation Management History:      The patient is taking warfarin and comes in today for a routine follow up visit.  Positive risk factors for bleeding include an age of 75 years or older.  Negative risk factors for bleeding include no history of CVA/TIA.  The bleeding index is 'intermediate risk'.  Positive CHADS2 values include History of HTN and Age > 70 years old.  Negative CHADS2 values include History of Diabetes and Prior Stroke/CVA/TIA.  The start date was 04/12/2003.  His last INR was 2.7.  Anticoagulation responsible provider: Agustine Rossitto.  INR POC: 2.5.  Cuvette Lot#: [id number].  Exp: 10/2010.    Anticoagulation Management Assessment/Plan:      The patient's current anticoagulation dose is Warfarin sodium 4 mg tabs: take 1/2 a tablet every day.  The target INR is 2 - 3.  The next INR is due 09/10/2009.  Anticoagulation instructions were given to patient.  Results were reviewed/authorized by Freddrick March, RN, BSN.  He was notified by Freddrick March RN.         Prior Anticoagulation Instructions: The patient is to continue with the same dose of coumadin.  This dosage includes: coumadin 2 mg daily with 1 mg on Tues and Thurs  Current Anticoagulation Instructions: INR 2.5  Continue on same dosage 2mg  daily except 1mg  on Tuesdays and Thursdays.  Recheck in 4 weeks.

## 2010-04-29 NOTE — Assessment & Plan Note (Signed)
Summary: 65 M F/U DLO R/S FROM 09/11/09   Vital Signs:  Patient profile:   77 year old male Height:      71.5 inches Weight:      168.2 pounds BMI:     23.22 Temp:     97.7 degrees F oral Pulse rate:   66 / minute Pulse rhythm:   regular BP sitting:   140 / 82  (left arm) Cuff size:   regular  Vitals Entered By: Zenda Alpers CMA Deborra Medina) (September 16, 2009 8:10 AM)  History of Present Illness: Chief complaint 6 month follow up - feels well  wanted to look at hernia.  also has some areas under L axillary elevated and palpable  no pain no rash  PAROXYSMAL ATRIAL FIBRILLATION, on amiodarone and coumadin  HYPERTENSION, slightly elev today, usually stable, normal  PROSTATE CANCER, HX OF - sees Dr. Dolores Frame  HYPOTHYROIDISM, f.u labs normal, reviewed TSH    Clinical Review Panels:  Immunizations   Last Tetanus Booster:  given (03/31/2003)   Last Flu Vaccine:  Fluvax 3+ (01/15/2009)   Last H1N1 Vaccine 1:  H1N1 vaccine G code (05/09/2008)   Last Pneumovax:  given (03/30/2005)   Last Zoster Vaccine:  Zostavax (02/06/2008)  CBC   WBC:  5.2 (09/04/2009)   RBC:  3.71 (09/04/2009)   Hgb:  12.6 (09/04/2009)   Hct:  36.8 (09/04/2009)   Platelets:  198.0 (09/04/2009)   MCV  99.4 (09/04/2009)   MCHC  34.1 (09/04/2009)   RDW  14.2 (09/04/2009)   PMN:  64.9 (09/04/2009)   Lymphs:  21.6 (09/04/2009)   Monos:  9.6 (09/04/2009)   Eosinophils:  3.5 (09/04/2009)   Basophil:  0.4 (09/04/2009)  Complete Metabolic Panel   Glucose:  98 (09/04/2009)   Sodium:  142 (09/04/2009)   Potassium:  4.0 (09/04/2009)   Chloride:  111 (09/04/2009)   CO2:  28 (09/04/2009)   BUN:  18 (09/04/2009)   Creatinine:  1.2 (09/04/2009)   Albumin:  3.8 (03/05/2009)   Total Protein:  7.0 (03/05/2009)   Calcium:  8.8 (09/04/2009)   Total Bili:  1.0 (03/05/2009)   Alk Phos:  107 (03/05/2009)   SGPT (ALT):  26 (03/05/2009)   SGOT (AST):  31 (03/05/2009)   Allergies: 1)  ! Penicillin  Past  History:  Past medical, surgical, family and social histories (including risk factors) reviewed, and no changes noted (except as noted below).  Past Medical History: Reviewed history from 09/10/2008 and no changes required. 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 Phillip Heal PAROXYSMAL ATRIAL FIBRILLATION (ICD-427.31) PACEMAKER (ICD-V45.Marland Kitchen01) HYPERLIPIDEMIA (ICD-272.4) COUMADIN THERAPY (ICD-V58.61) HYPERTENSION, ESSENTIAL NOS (ICD-401.9) GERD (ICD-530.81) GLAUCOMA (ICD-365.9) B12 DEFICIENCY (ICD-266.2) PEPTIC ULCER DISEASE (ICD-533.90) PROSTATE CANCER, HX OF (ICD-V10.46) -- 5 weeks radiation (10/2000), radiation seeds (12/2000) - Dr. Bernardo Heater DIVERTICULOSIS, COLON (ICD-562.10) [place] ABD CAV W/O MENTION OBST/GANGREN (ICD-553.9) HYPOGLYCEMIA, UNSPECIFIED (ICD-251.2) HYPOTHYROIDISM (ICD-244.9)   Cardiology - Dr. Cristopher Peru, [place] GI - Dr. Verl Blalock, [place] [facility] - Dr. Clemetine Marker, [place] [facility], Dr. John Giovanni Dermatology - Dr. Phillip Heal  Dental - Dr. Eugenie Birks General Surgery - Dr. Hassell Done [facility]) [facility]  Past Surgical History: Reviewed history from 07/19/2008 and no changes required. Pacemaker, 07/2005 - Dr. Cristopher Peru, [place] Inguinal  hernia repair - 11/2001 (req. Wound-Vac), drainage post surgery with mesh, 09/2004, 09/2007 - mesh removal Laparascopic hernia repair - 1994 Sebaceous cyst removal - 10/2007  - Dr. Clemetine Marker, L eye Cardiac ablation - 2001, 2003 Cataract Surgery - 07-06-96 (left) Unilateral inguinal hernia repair - 11/1999 TEE and cardiac catheter ablation - Jul 07, 1999, 06/2001 Colonoscopy and Endoscopy - 08/2001, for rectal bleeding Basel cell carcinomas removed from left side of face   Family  History: Reviewed history from 07/19/2008 and no changes required. Mother - breast CA Father - bone CA other relatives - DM, alcoholism, CAD, dementia No FH of Colon Cancer:  Social History: Reviewed history from 12/01/2007 and no changes required. Lives alone Retired Widower, wife died in 07-Jul-1998 Never Smoked Alcohol use-no Drug use-no Regular exercise-yes, walker Former Optometrist, Market researcher  Review of Systems  The patient denies anorexia, fever, and abdominal pain.    Physical Exam  General:  Well developed, well nourished, no acute distress.healthy appearing.   Head:  Normocephalic and atraumatic.no abnormalities observed and no abnormalities palpated.   Ears:  no external deformities.   Nose:  no external deformity.   Neck:  No deformities, masses, or tenderness noted. Chest Wall:  L anterior chest wall with prominent vascular and area in L axilla of concern and palpable and compressible Breasts:  No mass Lungs:  Clear throughout to auscultation.no intercostal retractions, no accessory muscle use, and normal breath sounds.   Heart:  Regular rate and rhythm; no murmurs, rubs,  or bruits. Abdomen:  Bowel sounds positive,abdomen soft and non-tender without masses, organomegaly   there is an external area of drainage in the L LQ with minimal drainage, no redness. no pain   Extremities:  no c/c/e Neurologic:  alert & oriented X3 and gait normal.   Psych:  Cognition and judgment appear intact. Alert and cooperative with normal attention span and concentration. No apparent delusions, illusions, hallucinations   Impression & Recommendations:  Problem # 1:  MASS, LEFT AXILLA (ICD-782.2) Assessment New Obtain u/s to check and eval, doppler area to see if vascular vs. lymphatic or other tissue  Orders: Radiology Referral (Radiology)  Problem # 2:  PAROXYSMAL ATRIAL FIBRILLATION (ICD-427.31) Assessment: Unchanged  His updated medication list for this problem  includes:    Warfarin Sodium 4 Mg Tabs (Warfarin sodium) .Marland Kitchen... Take 1/2 a tablet every day    Amiodarone Hcl 200 Mg Tabs (Amiodarone hcl) .Marland Kitchen... Take one tablet by mouth every day except sat and sun a half tablet    Amlodipine Besylate 2.5 Mg Tabs (Amlodipine besylate) .Marland Kitchen... 1 by mouth daily  Problem # 3:  HYPERTENSION, ESSENTIAL NOS (ICD-401.9)  His updated medication list for this problem includes:    Demadex 20 Mg Tabs (Torsemide) .Marland Kitchen... Take one tablet daily    Amlodipine Besylate 2.5 Mg Tabs (Amlodipine besylate) .Marland Kitchen... 1 by mouth daily  Problem # 4:  HYPOTHYROIDISM (ICD-244.9)  His updated medication list for this problem includes:    Synthroid 100 Mcg Tabs (Levothyroxine sodium) .Marland Kitchen... Take a half tablet daily  Complete Medication List: 1)  Protonix 40 Mg Tbec (Pantoprazole sodium) .Marland Kitchen.. 1 each day 30 minutes before meal 2)  Warfarin Sodium 4 Mg Tabs (Warfarin sodium) .... Take 1/2 a tablet every day 3)  Amiodarone Hcl 200 Mg Tabs (Amiodarone hcl) .... Take one tablet by mouth every day except sat and sun a half tablet 4)  Synthroid 100 Mcg Tabs (Levothyroxine sodium) .... Take a half tablet daily 5)  Fosamax 70  Mg Tabs (Alendronate sodium) .... Take one tablet weekly 6)  Demadex 20 Mg Tabs (Torsemide) .... Take one tablet daily 7)  Ferrous Sulfate 325 (65 Fe) Mg Tabs (Ferrous sulfate) .... Take one tablet daily 8)  Dorzolamide-timolol 2-0.5 % Soln (Dorzolamide-timolol) .Marland Kitchen.. 1 gtt in both eyes two times a day 9)  Centrum Silver Tabs (Multiple vitamins-minerals) .Marland Kitchen.. 1 tablet by mouth once daily 10)  Benefiber Powd (Wheat dextrin) .Marland Kitchen.. 1 tbs as needed 11)  Amlodipine Besylate 2.5 Mg Tabs (Amlodipine besylate) .Marland Kitchen.. 1 by mouth daily 12)  Pravastatin Sodium 40 Mg Tabs (Pravastatin sodium) .... One by mouth at bedtime  Patient Instructions: 1)  Referral Appointment Information 2)  Day/Date: 3)  Time: 4)  Place/MD: 5)  Address: 6)  Phone/Fax: 7)  Patient given appointment  information. Information/Orders faxed/mailed.  8)  f/u 6 months  Current Allergies (reviewed today): ! PENICILLIN

## 2010-04-29 NOTE — Assessment & Plan Note (Signed)
Summary: B/12/COPLAND/JRR   Nurse Visit   Allergies: 1)  ! Penicillin  Medication Administration  Injection # 1:    Medication: Vit B12 1000 mcg    Diagnosis: B12 DEFICIENCY (ICD-266.2)    Route: IM    Site: L deltoid    Exp Date: 06/29/2011    Lot #: [id number]    Mfr: American Regent    Patient tolerated injection without complications    Given by: Sherrian Divers CMA Deborra Medina) (January 10, 2010 2:06 PM)  Orders Added: 1)  Vit B12 1000 mcg [J3420] 2)  Admin of Therapeutic Inj  intramuscular or subcutaneous [id number]

## 2010-04-29 NOTE — Assessment & Plan Note (Signed)
Summary: Jenny Omdahl B12/RBH   Nurse Visit   Allergies: 1)  ! Penicillin  Medication Administration  Injection # 1:    Medication: Vit B12 1000 mcg    Diagnosis: B12 DEFICIENCY (ICD-266.2)    Route: IM    Site: L deltoid    Exp Date: 01/29/2011    Lot #: [id number]    Mfr: American Regent    Patient tolerated injection without complications    Given by: Sherrian Divers CMA ([place]) (May 31, 2009 10:01 AM)  Orders Added: 1)  Vit B12 1000 mcg [J3420] 2)  Admin of Therapeutic Inj  intramuscular or subcutaneous [id number]

## 2010-04-29 NOTE — Medication Information (Signed)
Summary: rov/ewj  Anticoagulant Therapy  Managed by: Freddrick March, RN, BSN Referring MD: Irish Lack MD PCP: Cecilio Asper Supervising MD: Rockey Situ Indication 1: Atrial Fibrillation (ICD-427.31) Indication 2: Atrial Flutter (ICD-427.32) Lab Used: [facility] Site: [place] INR POC 2.0 INR RANGE 2.0-3.0  Dietary changes: no    Health status changes: no    Bleeding/hemorrhagic complications: no    Recent/future hospitalizations: no    Any changes in medication regimen? no    Recent/future dental: no  Any missed doses?: no       Is patient compliant with meds? yes       Allergies: 1)  ! Penicillin  Anticoagulation Management History:      The patient is taking warfarin and comes in today for a routine follow up visit.  Positive risk factors for bleeding include an age of 75 years or older.  Negative risk factors for bleeding include no history of CVA/TIA.  The bleeding index is 'intermediate risk'.  Positive CHADS2 values include History of HTN and Age > 36 years old.  Negative CHADS2 values include History of Diabetes and Prior Stroke/CVA/TIA.  The start date was 04/12/2003.  His last INR was 2.7.  Anticoagulation responsible provider: Gollan.  INR POC: 2.0.  Cuvette Lot#: [id number].  Exp: 12/2010.    Anticoagulation Management Assessment/Plan:      The patient's current anticoagulation dose is Warfarin sodium 4 mg tabs: take 1/2 a tablet every day.  The target INR is 2 - 3.  The next INR is due 01/01/2010.  Anticoagulation instructions were given to patient.  Results were reviewed/authorized by Freddrick March, RN, BSN.  He was notified by Freddrick March RN.         Prior Anticoagulation Instructions: INR 2.2  Continue on same dosage 1 tablet daily except 1/2 tablet on Tuesdays, Thursdays, and Saturdays.  Recheck in 4 weeks.    Current Anticoagulation Instructions: INR 2.0  Take 1 tablet today and tomorrow, then resume same  dosage 1 tablet daily except 1/2 tablet on Tuesdays, Thursdays, and Saturdays.  Recheck in 4 weeks.

## 2010-04-29 NOTE — Medication Information (Signed)
Summary: CCR/GLC   Anticoagulant Therapy  Managed by: Freddrick March, RN, BSN Referring MD: Irish Lack MD PCP: Cecilio Asper Supervising MD: Rockey Situ Indication 1: Atrial Fibrillation (ICD-427.31) Indication 2: Atrial Flutter (ICD-427.32) Lab Used: [facility]  Site: [place] INR POC 3.7 INR RANGE 2.0-3.0  Dietary changes: no    Health status changes: no     Recent/future hospitalizations: no    Any changes in medication regimen? no     Any missed doses?: no       Is patient compliant with meds? yes       Allergies: 1)  ! Penicillin  Anticoagulation Management History:      The patient is taking warfarin and comes in today for a routine follow up visit.  Positive risk factors for bleeding include an age of 67 years or older.  Negative risk factors for bleeding include no history of CVA/TIA.  The bleeding index is 'intermediate risk'.  Positive CHADS2 values include History of HTN and Age > 80 years old.  Negative CHADS2 values include History of Diabetes and Prior Stroke/CVA/TIA.  The start date was 04/12/2003.  His last INR was 2.7.  Anticoagulation responsible provider: Mujtaba Bollig.  INR POC: 3.7.  Cuvette Lot#: [id number].  Exp: 11/2010.    Anticoagulation Management Assessment/Plan:      The patient's current anticoagulation dose is Warfarin sodium 4 mg tabs: take 1/2 a tablet every day.  The target INR is 2 - 3.  The next INR is due 10/16/2009.  Anticoagulation instructions were given to patient.  Results were reviewed/authorized by Freddrick March, RN, BSN.  He was notified by Cordelia Pen, RN.         Prior Anticoagulation Instructions: INR 2.1  Continue on same dosage 2mg  daily except 1 mg on Tuesdays and Thursdays.  Recheck in 4 weeks.    Current Anticoagulation Instructions: INR 3.7  Do not take coumadin today. Start taking 1 tab daily except for 1/2 tab on Tuesday, Thursday and Saturday. Recheck in 10 days. The patient is  to stop coumadin.

## 2010-04-29 NOTE — Medication Information (Signed)
Summary: CCR/AMD   Anticoagulant Therapy  Managed by: Alinda Deem, PharmD, BCPS, CPP Referring MD: Irish Lack MD PCP: Cecilio Asper Supervising MD: Haroldine Laws MD, Quillian Quince Indication 1: Atrial Fibrillation (ICD-427.31) Indication 2: Atrial Flutter (ICD-427.32) Lab Used: [facility] Site: [place] INR POC 2.5 INR RANGE 2.0-3.0  Dietary changes: no    Health status changes: no    Bleeding/hemorrhagic complications: no    Recent/future hospitalizations: no    Any changes in medication regimen? no    Recent/future dental: no  Any missed doses?: no       Is patient compliant with meds? yes       Allergies: 1)  ! Penicillin  Anticoagulation Management History:      The patient is taking warfarin and comes in today for a routine follow up visit.  Positive risk factors for bleeding include an age of 75 years or older.  Negative risk factors for bleeding include no history of CVA/TIA.  The bleeding index is 'intermediate risk'.  Positive CHADS2 values include History of HTN and Age > 11 years old.  Negative CHADS2 values include History of Diabetes and Prior Stroke/CVA/TIA.  The start date was 04/12/2003.  His last INR was 2.7.  Anticoagulation responsible provider: Shirrell Solinger MD, Quillian Quince.  INR POC: 2.5.    Anticoagulation Management Assessment/Plan:      The patient's current anticoagulation dose is Warfarin sodium 4 mg tabs: take 1/2 a tablet every day.  The target INR is 2 - 3.  The next INR is due 08/13/2009.  Anticoagulation instructions were given to patient.  Results were reviewed/authorized by Alinda Deem, PharmD, BCPS, CPP.  He was notified by Gabriel Cirri, RN, BSN.         Prior Anticoagulation Instructions: no coumadin today then coumadin 2 mg daily with 1 mg on Tues  and Thurs  Current Anticoagulation Instructions: The patient is to continue with the same dose of coumadin.  This dosage includes: coumadin 2 mg daily with  1 mg on Tues and Thurs

## 2010-04-29 NOTE — Progress Notes (Signed)
Summary: fosamax  Phone Note Refill Request Call back at Home Phone [phone] Message from:  Patient on June 18, 2009 4:24 PM  Refills Requested: Medication #1:  FOSAMAX 70 MG TABS take one tablet weekly Needs 90 day script sent to [facility].   Initial call taken by: Lacretia Nicks,  June 18, 2009 4:24 PM Caller: Patient    Prescriptions: FOSAMAX 70 MG TABS (ALENDRONATE SODIUM) take one tablet weekly  #12 x 3   Entered and Authorized by:   Zenda Alpers CMA ([place])   Signed by:   Zenda Alpers CMA ([place]) on 06/18/2009   Method used:   Faxed to ...       [facility] (mail-order)             ,          Ph: [phone]       Fax: [phone]   [id number] FOSAMAX 70 MG TABS (ALENDRONATE SODIUM) take one tablet weekly  #4 x 11   Entered and Authorized by:   Zenda Alpers CMA ([place])   Signed by:   Zenda Alpers CMA ([place]) on 06/18/2009   Method used:   Faxed to ...       [facility] (mail-order)             ,          Ph: [phone]       Fax: [phone]   RxID:   [id number]

## 2010-04-29 NOTE — Letter (Signed)
Summary: [facility]   Imported By: Edmonia James 06/21/2009 11:02:49  _____________________________________________________________________  External Attachment:    Type:   Image     Comment:   External Document

## 2010-04-29 NOTE — Medication Information (Signed)
Summary: CCR/AMD   Anticoagulant Therapy  Managed by: Alinda Deem, PharmD, BCPS, CPP Referring MD: Irish Lack MD PCP: Cecilio Asper Supervising MD: Lovena Le MD, Carleene Overlie Indication 1: Atrial Fibrillation (ICD-427.31) Indication 2: Atrial Flutter (ICD-427.32) Lab Used: [facility] Site: [place] INR POC 3.0 INR RANGE 2.0-3.0  Dietary changes: no    Health status changes: yes       Details: pt had hypoglycemia event on Sat and fell- hit head.  Skin tears to face.  Bruised eye, bruised wrist  Bleeding/hemorrhagic complications: no    Recent/future hospitalizations: no    Any changes in medication regimen? no    Recent/future dental: no  Any missed doses?: no       Is patient compliant with meds? yes       Allergies: 1)  ! Penicillin  Anticoagulation Management History:      The patient is taking warfarin and comes in today for a routine follow up visit.  Positive risk factors for bleeding include an age of 75 years or older.  Negative risk factors for bleeding include no history of CVA/TIA.  The bleeding index is 'intermediate risk'.  Positive CHADS2 values include History of HTN and Age > 75 years old.  Negative CHADS2 values include History of Diabetes and Prior Stroke/CVA/TIA.  The start date was 04/12/2003.  His last INR was 2.7.  Anticoagulation responsible provider: Lovena Le MD, Carleene Overlie.  INR POC: 3.0.    Anticoagulation Management Assessment/Plan:      The patient's current anticoagulation dose is Warfarin sodium 4 mg tabs: take 1/2 a tablet every day.  The target INR is 2 - 3.  The next INR is due 06/03/2009.  Anticoagulation instructions were given to patient.  Results were reviewed/authorized by Alinda Deem, PharmD, BCPS, CPP.  He was notified by Gabriel Cirri, RN, BSN.         Prior Anticoagulation Instructions: The patient is to continue with the same dose of coumadin.  This dosage includes: 2 mg daily  Current  Anticoagulation Instructions: The patient is to continue with the same dose of coumadin.  This dosage includes: coumadin 2 mg daily

## 2010-04-29 NOTE — Cardiovascular Report (Signed)
Summary: Office Visit   Office Visit   Imported By: Sallee Provencal 10/08/2009 12:17:18  _____________________________________________________________________  External Attachment:    Type:   Image     Comment:   External Document

## 2010-04-29 NOTE — Medication Information (Signed)
Summary: CCR  Anticoagulant Therapy  Managed by: Freddrick March, RN, BSN Referring MD: Irish Lack MD PCP: Cecilio Asper Supervising MD: Rockey Situ Indication 1: Atrial Fibrillation (ICD-427.31) Indication 2: Atrial Flutter (ICD-427.32) Lab Used: [facility] Site: [place] INR POC 2.1 INR RANGE 2.0-3.0  Dietary changes: no     Bleeding/hemorrhagic complications: no     Any changes in medication regimen? no     Any missed doses?: no       Is patient compliant with meds? yes       Allergies: 1)  ! Penicillin  Anticoagulation Management History:      The patient is taking warfarin and comes in today for a routine follow up visit.  Positive risk factors for bleeding include an age of 75 years or older.  Negative risk factors for bleeding include no history of CVA/TIA.  The bleeding index is 'intermediate risk'.  Positive CHADS2 values include History of HTN and Age > 6 years old.  Negative CHADS2 values include History of Diabetes and Prior Stroke/CVA/TIA.  The start date was 04/12/2003.  His last INR was 2.7.  Anticoagulation responsible provider: Goll[facility].  INR POC: 2.1.  Cuvette Lot#: [id number].  Exp: 11/2010.    Anticoagulation Management Assessment/Plan:      The patient's current anticoagulation dose is Warfarin sodium 4 mg tabs: take 1/2 a tablet every day.  The target INR is 2 - 3.  The next INR is due 10/08/2009.  Anticoagulation instructions were given to patient.  Results were reviewed/authorized by Freddrick March, RN, BSN.  He was notified by Freddrick March RN.         Prior Anticoagulation Instructions: INR 2.5  Continue on same dosage 2mg  daily except 1mg  on Tuesdays and Thursdays.  Recheck in 4 weeks.    Current Anticoagulation Instructions: INR 2.1  Continue on same dosage 2mg  daily except 1 mg on Tuesdays and Thursdays.  Recheck in 4 weeks.

## 2010-04-29 NOTE — Assessment & Plan Note (Signed)
Summary: [name] [name] B12/RBH   Nurse Visit   Allergies: 1)  ! Penicillin  Medication Administration  Injection # 1:    Medication: Vit B12 1000 mcg    Diagnosis: B12 DEFICIENCY (ICD-266.2)    Route: IM    Site: R deltoid    Exp Date: 11/29/2010    Lot #: [id number]    Mfr: American Regent    Patient tolerated injection without complications    Given by: Zenda Alpers CMA ([place]) (July 02, 2009 9:57 AM)  Orders Added: 1)  Vit B12 1000 mcg [J3420] 2)  Admin of Therapeutic Inj  intramuscular or subcutaneous [id number]

## 2010-04-29 NOTE — Miscellaneous (Signed)
Summary: flu vaccine   Clinical Lists Changes  Observations: Added new observation of FLU VAX: Historical at [facility] (01/13/2010 16:17)      Immunization History:  Influenza Immunization History:    Influenza:  historical at [facility] (01/13/2010)

## 2010-04-29 NOTE — Medication Information (Signed)
Summary: rov/ewj  Anticoagulant Therapy  Managed by: Freddrick March, RN, BSN Referring MD: Irish Lack MD PCP: Cecilio Asper Supervising MD: Rockey Situ Indication 1: Atrial Fibrillation (ICD-427.31) Indication 2: Atrial Flutter (ICD-427.32) Lab Used: [facility] Site: [place] INR POC 2.2 INR RANGE 2.0-3.0  Dietary changes: no    Health status changes: no    Bleeding/hemorrhagic complications: no    Recent/future hospitalizations: no    Any changes in medication regimen? no    Recent/future dental: no  Any missed doses?: no       Is patient compliant with meds? yes       Allergies: 1)  ! Penicillin  Anticoagulation Management History:      The patient is taking warfarin and comes in today for a routine follow up visit.  Positive risk factors for bleeding include an age of 75 years or older.  Negative risk factors for bleeding include no history of CVA/TIA.  The bleeding index is 'intermediate risk'.  Positive CHADS2 values include History of HTN and Age > 91 years old.  Negative CHADS2 values include History of Diabetes and Prior Stroke/CVA/TIA.  The start date was 04/12/2003.  His last INR was 2.7.  Anticoagulation responsible provider: Gollan.  INR POC: 2.2.  Cuvette Lot#: [id number].  Exp: 01/2011.    Anticoagulation Management Assessment/Plan:      The patient's current anticoagulation dose is Warfarin sodium 4 mg tabs: take 1/2 a tablet every day.  The target INR is 2 - 3.  The next INR is due 02/19/2010.  Anticoagulation instructions were given to patient.  Results were reviewed/authorized by Freddrick March, RN, BSN.  He was notified by Freddrick March RN.         Prior Anticoagulation Instructions: INR 1.7  Take 1.5 tablets today, then start taking 1 tablet daily except 1/2 tablet on Tuesdays and Saturdays.  Recheck in 3 weeks.    Current Anticoagulation Instructions: INR 2.2  Continue on same dosage 1 tablet daily except 1/2  tablet on Tuesdays and Saturdays.  Recheck in 4 weeks.

## 2010-04-29 NOTE — Assessment & Plan Note (Signed)
Summary: B-12   Nurse Visit   Allergies: 1)  ! Penicillin  Medication Administration  Injection # 1:    Medication: Vit B12 1000 mcg    Diagnosis: B12 DEFICIENCY (ICD-266.2)    Route: IM    Site: L deltoid    Exp Date: 11/29/2010    Lot #: [id number]    Mfr: [employer] Regent    Patient tolerated injection without complications    Given by: Zenda Alpers CMA ([place]) (Aug 01, 2009 9:52 AM)  Orders Added: 1)  Vit B12 1000 mcg [J3420] 2)  Admin of Therapeutic Inj  intramuscular or subcutaneous [id number]

## 2010-04-29 NOTE — Medication Information (Signed)
Summary: rov/ewj  Anticoagulant Therapy  Managed by: Freddrick March, RN, BSN Referring MD: Irish Lack MD PCP: Cecilio Asper Supervising MD: Rockey Situ Indication 1: Atrial Fibrillation (ICD-427.31) Indication 2: Atrial Flutter (ICD-427.32) Lab Used: [facility] Site: [place] INR RANGE 2.0-3.0  Dietary changes: yes       Details: Incr intake of turnip greens x 2 days.  Health status changes: no    Bleeding/hemorrhagic complications: no    Recent/future hospitalizations: no    Any changes in medication regimen? no    Recent/future dental: no  Any missed doses?: no       Is patient compliant with meds? yes       Allergies: 1)  ! Penicillin  Anticoagulation Management History:      The patient is taking warfarin and comes in today for a routine follow up visit.  Positive risk factors for bleeding include an age of 75 years or older.  Negative risk factors for bleeding include no history of CVA/TIA.  The bleeding index is 'intermediate risk'.  Positive CHADS2 values include History of HTN and Age > 43 years old.  Negative CHADS2 values include History of Diabetes and Prior Stroke/CVA/TIA.  The start date was 04/12/2003.  His last INR was 2.7.  Anticoagulation responsible provider: Gollan.  Cuvette Lot#: [id number].  Exp: 01/2011.    Anticoagulation Management Assessment/Plan:      The patient's current anticoagulation dose is Warfarin sodium 4 mg tabs: take 1/2 a tablet every day.  The target INR is 2 - 3.  The next INR is due 01/22/2010.  Anticoagulation instructions were given to patient.  Results were reviewed/authorized by Freddrick March, RN, BSN.  He was notified by Freddrick March RN.         Prior Anticoagulation Instructions: INR 2.0  Take 1 tablet today and tomorrow, then resume same dosage 1 tablet daily except 1/2 tablet on Tuesdays, Thursdays, and Saturdays.  Recheck in 4 weeks.    Current Anticoagulation Instructions: INR  1.7  Take 1.5 tablets today, then start taking 1 tablet daily except 1/2 tablet on Tuesdays and Saturdays.  Recheck in 3 weeks.

## 2010-04-29 NOTE — Medication Information (Signed)
Summary: rov/ewj  Anticoagulant Therapy  Managed by: Tula Nakayama, RN, BSN Referring MD: Irish Lack MD PCP: Cecilio Asper Supervising MD: Rockey Situ Indication 1: Atrial Fibrillation (ICD-427.31) Indication 2: Atrial Flutter (ICD-427.32) Lab Used: [facility] Site: [place] INR POC 2.6 INR RANGE 2.0-3.0  Dietary changes: no    Health status changes: no    Bleeding/hemorrhagic complications: no    Recent/future hospitalizations: no    Any changes in medication regimen? no    Recent/future dental: no  Any missed doses?: no       Is patient compliant with meds? yes       Allergies: 1)  ! Penicillin  Anticoagulation Management History:      The patient is taking warfarin and comes in today for a routine follow up visit.  Positive risk factors for bleeding include an age of 54 years or older.  Negative risk factors for bleeding include no history of CVA/TIA.  The bleeding index is 'intermediate risk'.  Positive CHADS2 values include History of HTN and Age > 39 years old.  Negative CHADS2 values include History of Diabetes and Prior Stroke/CVA/TIA.  The start date was 04/12/2003.  His last INR was 2.7.  Anticoagulation responsible provider: Gollan.  INR POC: 2.6.  Cuvette Lot#: [id number].  Exp: 02/2011.    Anticoagulation Management Assessment/Plan:      The patient's current anticoagulation dose is Warfarin sodium 4 mg tabs: take 1/2 a tablet every day.  The target INR is 2 - 3.  The next INR is due 03/19/2010.  Anticoagulation instructions were given to patient.  Results were reviewed/authorized by Tula Nakayama, RN, BSN.  He was notified by Tula Nakayama, RN, BSN.         Prior Anticoagulation Instructions: INR 2.2  Continue on same dosage 1 tablet daily except 1/2 tablet on Tuesdays and Saturdays.  Recheck in 4 weeks.    Current Anticoagulation Instructions: INR 2.6 Continue 2mg  everyday except 1mg s on Tuesdays and Saturdays.  Recheck in 4 weeks.

## 2010-04-29 NOTE — Assessment & Plan Note (Signed)
Summary: B-12   Nurse Visit   Allergies: 1)  ! Penicillin  Medication Administration  Injection # 1:    Medication: Vit B12 1000 mcg    Diagnosis: B12 DEFICIENCY (ICD-266.2)    Route: IM    Site: L deltoid    Exp Date: 02/28/2011    Lot #: [id number]    Mfr: American Regent    Patient tolerated injection without complications    Given by: Ozzie Hoyle LPN (August 12, 624THL 9:09 AM)  Orders Added: 1)  Vit B12 1000 mcg [J3420] 2)  Admin of Therapeutic Inj  intramuscular or subcutaneous [id number]

## 2010-04-29 NOTE — Medication Information (Signed)
Summary: Coumadin Clinic   Anticoagulant Therapy  Managed by: Alinda Deem, PharmD, BCPS, CPP Referring MD: Irish Lack MD PCP: Cecilio Asper Supervising MD: Lovena Le MD, Carleene Overlie Indication 1: Atrial Fibrillation (ICD-427.31) Indication 2: Atrial Flutter (ICD-427.32) Lab Used: [facility]--[place] Site: [place] INR POC 2.7  Dietary changes: no    Health status changes: no    Bleeding/hemorrhagic complications: no    Recent/future hospitalizations: no    Any changes in medication regimen? no    Recent/future dental: no  Any missed doses?: no       Is patient compliant with meds? yes       Allergies: 1)  ! Penicillin  Anticoagulation Management History:      The patient is taking warfarin and comes in today for a routine follow up visit.  Positive risk factors for bleeding include an age of 75 years or older.  Negative risk factors for bleeding include no history of CVA/TIA.  The bleeding index is 'intermediate risk'.  Positive CHADS2 values include History of HTN and Age > 73 years old.  Negative CHADS2 values include History of Diabetes and Prior Stroke/CVA/TIA.  The start date was 04/12/2003.  His last INR was 2.7.  Anticoagulation responsible provider: Lovena Le MD, Carleene Overlie.  INR POC: 2.7.    Anticoagulation Management Assessment/Plan:      The patient's current anticoagulation dose is Warfarin sodium 4 mg tabs: take 1/2 a tablet every day.  The target INR is 2 - 3.  The next INR is due 05/06/2009.  Anticoagulation instructions were given to patient.  Results were reviewed/authorized by Alinda Deem, PharmD, BCPS, CPP.  He was notified by Gabriel Cirri, RN, BSN.         Prior Anticoagulation Instructions: Hold today then resume dosage 2mg  everyday Pay attention to the greens..  Current Anticoagulation Instructions: The patient is to continue with the same dose of coumadin.  This dosage includes: 2 mg daily

## 2010-04-29 NOTE — Assessment & Plan Note (Signed)
Summary: B12 SHOT / [place]   Nurse Visit   Allergies: 1)  ! Penicillin  Medication Administration  Injection # 1:    Medication: Vit B12 1000 mcg    Diagnosis: B12 DEFICIENCY (ICD-266.2)    Route: IM    Site: R deltoid    Exp Date: 01/29/2011    Lot #: [id number]    Mfr: American Regent    Patient tolerated injection without complications    Given by: Zenda Alpers CMA ([place]) (October 08, 2009 8:44 AM)  Orders Added: 1)  Vit B12 1000 mcg [J3420] 2)  Admin of Therapeutic Inj  intramuscular or subcutaneous [id number]

## 2010-04-29 NOTE — Procedures (Signed)
Summary: PACER/AMD      Allergies Added:   Current Medications (verified): 1)  Protonix 40 Mg  Tbec (Pantoprazole Sodium) .Marland Kitchen.. 1 Each Day 30 Minutes Before Meal 2)  Warfarin Sodium 4 Mg Tabs (Warfarin Sodium) .... Take 1/2 A Tablet Every Day 3)  Amiodarone Hcl 200 Mg Tabs (Amiodarone Hcl) .... Take One Tablet By Mouth Every Day Except Sat and Sun A Half Tablet 4)  Synthroid 100 Mcg Tabs (Levothyroxine Sodium) .... Take A Half Tablet Daily 5)  Fosamax 70 Mg Tabs (Alendronate Sodium) .... Take One Tablet Weekly 6)  Demadex 20 Mg Tabs (Torsemide) .... Take One Tablet Daily 7)  Ferrous Sulfate 325 (65 Fe) Mg Tabs (Ferrous Sulfate) .... Take One Tablet Daily 8)  Dorzolamide-Timolol 2-0.5 % Soln (Dorzolamide-Timolol) .Marland Kitchen.. 1 Gtt in Both Eyes Two Times A Day 9)  Centrum Silver  Tabs (Multiple Vitamins-Minerals) .Marland Kitchen.. 1 Tablet By Mouth Once Daily 10)  Benefiber  Powd (Wheat Dextrin) .Marland Kitchen.. 1 Tbs As Needed 11)  Amlodipine Besylate 2.5 Mg Tabs (Amlodipine Besylate) .Marland Kitchen.. 1 By Mouth Daily 12)  Pravastatin Sodium 40 Mg  Tabs (Pravastatin Sodium) .... One By Mouth At Bedtime  Allergies (verified): 1)  ! Penicillin   PPM Specifications Following MD:  Cristopher Peru, MD     PPM Vendor:  Medtronic     PPM Model Number:  [id number]     PPM Serial Number:  [id number] PPM DOI:  08/21/2005      Lead 1    Location: RA     DOI: 08/21/2005     Model #: [id number]     Serial #: [id number]     Status: active Lead 2    Location: RV     DOI: 08/21/2005     Model #: [id number]     Serial #: [id number]     Status: active  Magnet Response Rate:  BOL 85 ERI 65  Indications:  SICK SINUS SYNDROME    PPM Follow Up Remote Check?  No Battery Voltage:  3.0 V     Pacer Dependent:  Yes       PPM Device Measurements Atrium  Amplitude: 1.1 mV, Impedance: 408 ohms, Threshold: 0.5 V at 0.4 msec Right Ventricle  Amplitude: 5.1 mV, Impedance: 640 ohms, Threshold: 1.0 V at 0.4 msec  Episodes Percent Mode Switch:  2.5%     Coumadin:   Yes Ventricular High Rate:  0     Atrial Pacing:  97.6%     Ventricular Pacing:  1.1%  Parameters Mode:  DDDR+     Lower Rate Limit:  60     Upper Rate Limit:  130 Paced AV Delay:  300     Sensed AV Delay:  300 Next Cardiology Appt Due:  03/30/2010 Tech Comments:  Atrial sensitivity reprogrammed 0.41mV.  Longest A-fib 1.15hours, + coumadin, Ventricular rates controlled.  ROV 6 months with Dr. Lovena Le in [place]. Alma Friendly, LPN  July  8, [id number] [id number] AM

## 2010-04-29 NOTE — Medication Information (Signed)
Summary: rov/ewj  Anticoagulant Therapy  Managed by: Freddrick March, RN, BSN Referring MD: Irish Lack MD PCP: Cecilio Asper Supervising MD: Rockey Situ Indication 1: Atrial Fibrillation (ICD-427.31) Indication 2: Atrial Flutter (ICD-427.32) Lab Used: [facility]--[place] Site: [place] INR POC 2.2 INR RANGE 2.0-3.0  Dietary changes: no    Health status changes: no    Bleeding/hemorrhagic complications: no    Recent/future hospitalizations: no    Any changes in medication regimen? no    Recent/future dental: no  Any missed doses?: no       Is patient compliant with meds? yes       Allergies: 1)  ! Penicillin  Anticoagulation Management History:      The patient is taking warfarin and comes in today for a routine follow up visit.  Positive risk factors for bleeding include an age of 75 years or older.  Negative risk factors for bleeding include no history of CVA/TIA.  The bleeding index is 'intermediate risk'.  Positive CHADS2 values include History of HTN and Age > 23 years old.  Negative CHADS2 values include History of Diabetes and Prior Stroke/CVA/TIA.  The start date was 04/12/2003.  His last INR was 2.7.  Anticoagulation responsible provider: Gollan.  INR POC: 2.2.  Cuvette Lot#: [id number].  Exp: 08/2010.    Anticoagulation Management Assessment/Plan:      The patient's current anticoagulation dose is Warfarin sodium 4 mg tabs: take 1/2 a tablet every day.  The target INR is 2 - 3.  The next INR is due 11/27/2009.  Anticoagulation instructions were given to patient.  Results were reviewed/authorized by Freddrick March, RN, BSN.  He was notified by Freddrick March RN.         Prior Anticoagulation Instructions: INR 2.2  Continue on same dosage 1 tablet daily except 1/2 tablet on Tuesdays, Thursdays, and Saturdays.  Recheck in 2 weeks.    Current Anticoagulation Instructions: INR 2.2  Continue on same dosage 1 tablet daily except 1/2  tablet on Tuesdays, Thursdays, and Saturdays.  Recheck in 4 weeks.

## 2010-05-01 NOTE — Assessment & Plan Note (Signed)
Summary: F1Y/AMD      Allergies Added:   Visit Type:  Follow-up Referring Provider:  n/a Primary Provider:  Frederico Hamman Copeland,MD  CC:  c/o blood sugar dropping. denies chest pain, SOB, and and palpitations..  History of Present Illness: Mr. Prinkey returns today for followup of atrial fibrillation and bradycardia s/p PPM.  The patient has done quite well in the past year.  He denies c/p, sob, or peripheral edema.  He admits to dietary indiscretion with sodium but has not had much trouble with CHF.  No symptomatic atrial fibrillation. No syncope. He has had some problems with low blood sugar.  Current Medications (verified): 1)  Protonix 40 Mg  Tbec (Pantoprazole Sodium) .Marland Kitchen.. 1 Each Day 30 Minutes Before Meal 2)  Warfarin Sodium 4 Mg Tabs (Warfarin Sodium) .... Take 1/2 A Tablet Every Day 3)  Amiodarone Hcl 200 Mg Tabs (Amiodarone Hcl) .... Take One Tablet By Mouth Every Day Except Sat and Sun A Half Tablet 4)  Synthroid 100 Mcg Tabs (Levothyroxine Sodium) .... Take A Half Tablet Daily 5)  Fosamax 70 Mg Tabs (Alendronate Sodium) .... Take One Tablet Weekly 6)  Demadex 20 Mg Tabs (Torsemide) .... Take One Tablet Daily 7)  Ferrous Sulfate 325 (65 Fe) Mg Tabs (Ferrous Sulfate) .... Take One Tablet Daily 8)  Dorzolamide-Timolol 2-0.5 % Soln (Dorzolamide-Timolol) .Marland Kitchen.. 1 Gtt in Both Eyes Two Times A Day 9)  Centrum Silver  Tabs (Multiple Vitamins-Minerals) .Marland Kitchen.. 1 Tablet By Mouth Once Daily 10)  Benefiber  Powd (Wheat Dextrin) .Marland Kitchen.. 1 Tbs As Needed 11)  Amlodipine Besylate 2.5 Mg Tabs (Amlodipine Besylate) .Marland Kitchen.. 1 By Mouth Daily 12)  Pravastatin Sodium 40 Mg  Tabs (Pravastatin Sodium) .... One By Mouth At Bedtime 13)  Vitamin B-12 1000 Mcg/17ml Liqd (Cyanocobalamin) .... One Injection Monthly  Allergies (verified): 1)  ! 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 Phillip Heal PAROXYSMAL ATRIAL FIBRILLATION (ICD-427.31) PACEMAKER (ICD-V45.Marland Kitchen01) HYPERLIPIDEMIA (ICD-272.4) COUMADIN THERAPY (ICD-V58.61) HYPERTENSION, ESSENTIAL NOS (ICD-401.9) GERD (ICD-530.81) GLAUCOMA (ICD-365.9) B12 DEFICIENCY (ICD-266.2) PEPTIC ULCER DISEASE (ICD-533.90) PROSTATE CANCER, HX OF (ICD-V10.46) -- 5 weeks radiation (10/2000), radiation seeds (12/2000) - Dr. Bernardo Heater DIVERTICULOSIS, COLON (ICD-562.10) Zeba ABD CAV W/O MENTION OBST/GANGREN (ICD-553.9) HYPOGLYCEMIA, UNSPECIFIED (ICD-251.2) HYPOTHYROIDISM (ICD-244.9)  Past Surgical History: Last updated: 07/19/2008 Pacemaker, 07/2005 - Dr. Cristopher Peru, [place] Inguinal hernia repair - 11/2001 (req. Wound-Vac), drainage post surgery with mesh, 09/2004, 09/2007 - mesh removal Laparascopic hernia repair - 1994 Sebaceous cyst removal - 10/2007  - Dr. Clemetine Marker, L eye Cardiac ablation - 2001, 2003 Cataract Surgery - 1998 (left) Unilateral inguinal hernia repair - 11/1999 TEE and cardiac catheter ablation - 05/1999, 06/2001 Colonoscopy and Endoscopy - 08/2001, for rectal bleeding Basel cell carcinomas removed from left side of face   Risk Factors: Exercise: yes (12/01/2007)  Risk Factors: Smoking Status: never (12/01/2007)  Review of Systems  The patient denies chest pain, syncope, dyspnea on exertion, and peripheral edema.    Vital Signs:  Patient profile:   77 year old male Height:      71.5 inches Weight:      164.50 pounds BMI:     22.71 Pulse rate:   64 / minute BP sitting:  104 / 62  (left arm) Cuff size:   regular  Vitals Entered By: Rodman Comp CMA (April 04, 2010 10:22 AM)   Physical Exam  General:  Well developed, well nourished, no acute distress.healthy appearing.   Head:  Normocephalic and atraumatic.no abnormalities observed and no  abnormalities palpated.   Eyes:  vision grossly intact.   Mouth:  pharynx pink and moist, no erythema, and no exudates.   Neck:  No deformities, masses, or tenderness noted. Chest Wall:  well healed PPM incision. Lungs:  Normal respiratory effort, chest expands symmetrically. Lungs are clear to auscultation, no crackles or wheezes. Heart:  Regular rate and rhythm; no murmurs, rubs,  or bruits. Abdomen:  Bowel sounds positive,abdomen soft and non-tender without masses, organomegaly   there is an external area of drainage in the L LQ with minimal drainage, no redness. no pain   Msk:  Symmetrical with no gross deformities. Normal posture. Extremities:  No clubbing, cyanosis, edema, or deformity noted with normal full range of motion of all joints.   Neurologic:  alert & oriented X3 and gait normal.     PPM Specifications Following MD:  Cristopher Peru, MD     PPM Vendor:  Medtronic     PPM Model Number:  [id number]     PPM Serial Number:  [id number] PPM DOI:  08/21/2005      Lead 1    Location: RA     DOI: 08/21/2005     Model #: [id number]     Serial #: [id number]     Status: active Lead 2    Location: RV     DOI: 08/21/2005     Model #: [id number]     Serial #: [id number]     Status: active  Magnet Response Rate:  BOL 85 ERI 65  Indications:  SICK SINUS SYNDROME    PPM Follow Up Remote Check?  No Battery Voltage:  3.00 V     Pacer Dependent:  Yes       PPM Device Measurements Atrium  Amplitude: 1.2 mV, Impedance: 440 ohms, Threshold: 1.0 V at 0.4 msec Right Ventricle  Amplitude: 5.1 mV, Impedance: 600 ohms, Threshold: 1.0 V at 0.4 msec  Episodes MS Episodes:  389     Percent Mode Switch:  3.1%     Coumadin:  Yes Atrial Pacing:  96.7%     Ventricular Pacing:  1.2%  Parameters Mode:  DDDR+     Lower Rate Limit:  60     Upper Rate Limit:  130 Paced AV Delay:  300     Sensed AV Delay:  300 Next Cardiology Appt Due:  09/28/2010 Tech Comments:  No parameter changes. Device function normal.    Ventricular rates controlled during A-fib, + coumadin.  No Carelink @ this time.  ROV 6 months [place] [facility]. Alma Friendly, LPN  January  6, [id number] 10:37 AM  MD Comments:  agree with above.  Impression & Recommendations:  Problem # 1:  PAROXYSMAL ATRIAL FIBRILLATION (ICD-427.31) His ventricular rate is controlled. Continue meds as below.  He is maintaining NSR.  His updated medication list for this problem includes:    Warfarin Sodium 4 Mg Tabs (Warfarin sodium) .Marland Kitchen... Take 1/2 a tablet every day    Amiodarone Hcl 200 Mg Tabs (Amiodarone hcl) .Marland Kitchen... Take one tablet by mouth every day except sat and sun a half tablet  Problem # 2:  PACEMAKER, PERMANENT (ICD-V45.01) His device is working normally. Will follow.

## 2010-05-01 NOTE — Assessment & Plan Note (Signed)
Summary: B-12 INJ/CLE   Nurse Visit   Allergies: 1)  ! Penicillin  Medication Administration  Injection # 1:    Medication: Vit B12 1000 mcg    Diagnosis: IRON DEFICIENCY (ICD-280.9)    Route: IM    Site: L deltoid    Exp Date: 12/29/2011    Lot #: [id number]    Mfr: [place]    Patient tolerated injection without complications    Given by: Sherrian Divers CMA ([place]) (April 18, 2010 8:25 AM)  Orders Added: 1)  Vit B12 1000 mcg [J3420] 2)  Admin of Therapeutic Inj  intramuscular or subcutaneous [id number]

## 2010-05-01 NOTE — Assessment & Plan Note (Signed)
Summary: [facility] FOLLOW UP / LFW   Vital Signs:  Patient profile:   77 year old male Height:      71.5 inches Weight:      171.8 pounds BMI:     23.71 Temp:     97.6 degrees F oral Pulse rate:   66 / minute Pulse rhythm:   regular BP sitting:   120 / 80  (left arm) Cuff size:   regular  Vitals Entered By: Zenda Alpers CMA Deborra Medina) (March 18, 2010 10:55 AM)  History of Present Illness: Chief complaint 6 month follow up hypertension  f/u, HTN, thyroid, lipids, AF. all stable and doing well.  s/p prostate CA, neglible PSA. Seeing Dr. Bernardo Heater.  Has a girlfriend, eats out a lot. Used to go dancing once a week, but not doing that as much.  When her feet are not bothering her   09/16/2009 OV: wanted to look at hernia. also has some areas under L axillary elevated and palpable  no pain no rash  PAROXYSMAL ATRIAL FIBRILLATION, on amiodarone and coumadin  HYPERTENSION, slightly elev today, usually stable, normal  PROSTATE CANCER, HX OF - sees Dr. Dolores Frame  HYPOTHYROIDISM, f.u labs normal, reviewed TSH    Current Problems (verified): 1)  Paroxysmal Atrial Fibrillation  (ICD-427.31) 2)  Pacemaker, Permanent  (ICD-V45.01) 3)  Hyperlipidemia  (ICD-272.4) 4)  Coumadin Therapy  (ICD-V58.61) 5)  Hypertension, Essential Nos  (ICD-401.9) 6)  Gerd  (ICD-530.81) 7)  Glaucoma  (ICD-365.9) 8)  B12 Deficiency  (ICD-266.2) 9)  Peptic Ulcer Disease  (ICD-533.90) 10)  Prostate Cancer, Hx of  (ICD-V10.46) 11)  Radiation Proctitis  (ICD-558.1) 12)  Diverticulosis, Colon  (ICD-562.10) 13)  Encounter For Long-term Use of Other Medications  (ICD-V58.69) 14)  Hern Uns Site Abd Cav w/o Mention Obst/gangren  (ICD-553.9) 15)  Hypothyroidism  (ICD-244.9)  Allergies: 1)  ! Penicillin  Past History:  Past medical, surgical, family and social histories (including risk factors) reviewed, and no changes noted (except as noted below).  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 Phillip Heal PAROXYSMAL ATRIAL FIBRILLATION (ICD-427.31) PACEMAKER (ICD-V45.Marland Kitchen01) HYPERLIPIDEMIA (ICD-272.4) COUMADIN THERAPY (ICD-V58.61) HYPERTENSION, ESSENTIAL NOS (ICD-401.9) GERD (ICD-530.81) GLAUCOMA (ICD-365.9) B12 DEFICIENCY (ICD-266.2) PEPTIC ULCER DISEASE (ICD-533.90) PROSTATE CANCER, HX OF (ICD-V10.46) -- 5 weeks radiation (10/2000), radiation seeds (12/2000) - Dr. Bernardo Heater DIVERTICULOSIS, COLON (ICD-562.10) [place] ABD CAV W/O MENTION OBST/GANGREN (ICD-553.9) HYPOGLYCEMIA, UNSPECIFIED (ICD-251.2) HYPOTHYROIDISM (ICD-244.9)  Past Surgical History: Reviewed history from 07/19/2008 and no changes required. Pacemaker, 07/2005 - Dr. Cristopher Peru, [place] Inguinal hernia repair - 11/2001 (req. Wound-Vac), drainage post surgery with mesh, 09/2004, 09/2007 - mesh removal Laparascopic hernia repair - 1994 Sebaceous cyst removal - 10/2007  - Dr. Clemetine Marker, L eye Cardiac ablation - 2001, 2003 Cataract Surgery - 1998 (left) Unilateral inguinal hernia repair - 11/1999 TEE and cardiac catheter ablation - 05/1999, 06/2001 Colonoscopy and Endoscopy - 08/2001, for rectal bleeding Basel cell carcinomas removed from left side of face   Past History:  Care Management: Cardiology: Dr. Lovena Le, EP, [place] Gastroenterology: Dr. Sharlett Iles Ophthalmology: Dr. Tobe Sos Urology: Dr. Bernardo Heater Dermatology: Dr. Phillip Heal Dental: Dr. Eugenie Birks Surgery: Dr. Hassell Done ([place])  Family History: Reviewed history from 07/19/2008 and no changes required. Mother - breast CA Father - bone CA  other relatives - DM, alcoholism, CAD, dementia No FH of Colon Cancer:  Social History: Reviewed history from 12/01/2007 and no changes required. Lives alone Retired Widower, wife died in  07-05-98 Never Smoked Alcohol use-no Drug use-no Regular exercise-yes, walker Former Optometrist, Market researcher  Review of Systems       REVIEW OF SYSTEMS GEN: No acute illnesses, no fever, chills, sweats. CV: No chest pain or SOB GI: No noted N or V Otherwise, pertinent positives and negatives are noted in the HPI.  persistent abd drainage s/p hernia repair difficulties.  Physical Exam  General:  Well developed, well nourished, no acute distress.healthy appearing.   Head:  Normocephalic and atraumatic.no abnormalities observed and no abnormalities palpated.   Ears:  no external deformities.   Nose:  no external deformity.   Lungs:  Normal respiratory effort, chest expands symmetrically. Lungs are clear to auscultation, no crackles or wheezes. Heart:  Regular rate and rhythm; no murmurs, rubs,  or bruits. Extremities:  No clubbing, cyanosis, edema, or deformity noted with normal full range of motion of all joints.   Neurologic:  alert & oriented X3 and gait normal.   Cervical Nodes:  No lymphadenopathy noted Psych:  Cognition and judgment appear intact. Alert and cooperative with normal attention span and concentration. No apparent delusions, illusions, hallucinations   Impression & Recommendations:  Problem # 1:  HYPERTENSION, ESSENTIAL NOS (ICD-401.9) Assessment Improved  His updated medication list for this problem includes:    Demadex 20 Mg Tabs (Torsemide) .Marland Kitchen... Take one tablet daily    Amlodipine Besylate 2.5 Mg Tabs (Amlodipine besylate) .Marland Kitchen... 1 by mouth daily  BP today: 120/80 Prior BP: 140/82 (09/16/2009)  Prior 10 Yr Risk Heart Disease: 7 % (09/10/2008)  Labs Reviewed: K+: 4.0 (09/04/2009) Creat: : 1.2 (09/04/2009)   Chol: 164 (03/05/2009)   HDL: 74.10 (03/05/2009)   LDL: 80 (03/05/2009)   TG: 50.0 (03/05/2009)  Problem # 2:  PAROXYSMAL ATRIAL FIBRILLATION (ICD-427.31) Assessment: Unchanged  His updated medication list for this problem includes:     Warfarin Sodium 4 Mg Tabs (Warfarin sodium) .Marland Kitchen... Take 1/2 a tablet every day    Amiodarone Hcl 200 Mg Tabs (Amiodarone hcl) .Marland Kitchen... Take one tablet by mouth every day except sat and sun a half tablet    Amlodipine Besylate 2.5 Mg Tabs (Amlodipine besylate) .Marland Kitchen... 1 by mouth daily  Reviewed the following: PT: 28.3 (12/05/2008)   INR: 2.7 (12/05/2008) Coumadin Dose (weekly): 12 mg (02/19/2010) Prior Coumadin Dose (weekly): 12 mg (02/19/2010) Next Protime: 03/19/2010 (dated on 02/19/2010)  Problem # 3:  HYPERLIPIDEMIA (ICD-272.4) Assessment: Unchanged  His updated medication list for this problem includes:    Pravastatin Sodium 40 Mg Tabs (Pravastatin sodium) ..... One by mouth at bedtime  Labs Reviewed: SGOT: 31 (03/05/2009)   SGPT: 26 (03/05/2009)  Lipid Goals: Chol Goal: 200 (09/10/2008)   HDL Goal: 40 (09/10/2008)   LDL Goal: 160 (09/10/2008)   TG Goal: 150 (09/10/2008)  Prior 10 Yr Risk Heart Disease: 7 % (09/10/2008)   HDL:74.10 (03/05/2009), 56.00 (09/10/2008)  LDL:80 (03/05/2009), 58 (09/10/2008)  Chol:164 (03/05/2009), 125 (09/10/2008)  Trig:50.0 (03/05/2009), 54.0 (09/10/2008)  Orders: Prescription Created Electronically [phone])  Problem # 4:  B12 DEFICIENCY (ICD-266.2) Assessment: Unchanged  Orders: Vit B12 1000 mcg (J3420)  Problem # 5:  PROSTATE CANCER, HX OF (ICD-V10.46)  Problem # 6:  HYPOTHYROIDISM (ICD-244.9) check levels at blood draw in AM at [facility]  His updated medication list for this problem includes:    Synthroid  100 Mcg Tabs (Levothyroxine sodium) .Marland Kitchen... Take a half tablet daily  Complete Medication List: 1)  Protonix 40 Mg Tbec (Pantoprazole sodium) .Marland Kitchen.. 1 each day 30 minutes before meal 2)  Warfarin Sodium 4 Mg Tabs (Warfarin sodium) .... Take 1/2 a tablet every day 3)  Amiodarone Hcl 200 Mg Tabs (Amiodarone hcl) .... Take one tablet by mouth every day except sat and sun a half tablet 4)  Synthroid 100 Mcg Tabs (Levothyroxine sodium) ....  Take a half tablet daily 5)  Fosamax 70 Mg Tabs (Alendronate sodium) .... Take one tablet weekly 6)  Demadex 20 Mg Tabs (Torsemide) .... Take one tablet daily 7)  Ferrous Sulfate 325 (65 Fe) Mg Tabs (Ferrous sulfate) .... Take one tablet daily 8)  Dorzolamide-timolol 2-0.5 % Soln (Dorzolamide-timolol) .Marland Kitchen.. 1 gtt in both eyes two times a day 9)  Centrum Silver Tabs (Multiple vitamins-minerals) .Marland Kitchen.. 1 tablet by mouth once daily 10)  Benefiber Powd (Wheat dextrin) .Marland Kitchen.. 1 tbs as needed 11)  Amlodipine Besylate 2.5 Mg Tabs (Amlodipine besylate) .Marland Kitchen.. 1 by mouth daily 12)  Pravastatin Sodium 40 Mg Tabs (Pravastatin sodium) .... One by mouth at bedtime 13)  Vitamin B-12 1000 Mcg/30ml Liqd (Cyanocobalamin) .... One injection monthly  Other Orders: Admin of Therapeutic Inj  intramuscular or subcutaneous [id number])  Patient Instructions: 1)  f/u for Labs this week: 2)  FLP: 272.4 3)  TSH, Free T4, Free T3: 244.9 4)  BMP, HFP, CBC with diff: v58.69 5)  b12 level: 266.2 6)  Follow-up with me in 6 months (Medicare wellness visit, 30 minutes) Prescriptions: DEMADEX 20 MG TABS (TORSEMIDE) take one tablet daily  #90 x 3   Entered and Authorized by:   Owens Loffler MD   Signed by:   Owens Loffler MD on 03/18/2010   Method used:   Electronically to        [facility] (retail)             ,          Ph: [phone]       Fax: [phone]   RxIDES:[id number] AMLODIPINE BESYLATE 2.5 MG TABS (AMLODIPINE BESYLATE) 1 by mouth daily  #90 x 3   Entered and Authorized by:   Owens Loffler MD   Signed by:   Owens Loffler MD on 03/18/2010   Method used:   Electronically to        [facility] (retail)             ,          Ph: [phone]       Fax: [phone]   RxIDDA:[id number] PRAVASTATIN SODIUM 40 MG  TABS (PRAVASTATIN SODIUM) one by mouth at bedtime  #90 x 3   Entered and Authorized by:   Owens Loffler MD   Signed by:   Owens Loffler MD on 03/18/2010   Method used:    Electronically to        [facility] (retail)             ,          Ph: [phone]       Fax: [phone]   RxIDKU:[id number] WARFARIN SODIUM 4 MG TABS (WARFARIN SODIUM) take 1/2 a tablet every day  #45 x 3   Entered and Authorized by:   Owens Loffler MD   Signed by:   Owens Loffler MD on 03/18/2010   Method used:   Electronically  to        [facility] (retail)             ,          Ph: [phone]       Fax: [phone]   RxID:   [id number]    Medication Administration  Injection # 1:    Medication: Vit B12 1000 mcg    Diagnosis: B12 DEFICIENCY (ICD-266.2)    Route: IM    Site: L deltoid    Exp Date: 09/28/2011    Lot #: [id number]    Mfr: [place]    Patient tolerated injection without complications    Given by: Zenda Alpers CMA Deborra Medina) (March 18, 2010 10:58 AM)  Orders Added: 1)  Vit B12 1000 mcg [J3420] 2)  Admin of Therapeutic Inj  intramuscular or subcutaneous [[id number]] 3)  Prescription Created Electronically [[id number]] 4)  Est. Patient Level IV [id number]    Current Allergies (reviewed today): ! PENICILLIN

## 2010-05-01 NOTE — Medication Information (Signed)
Summary: rov/tm  Anticoagulant Therapy  Managed by: Freddrick March, RN, BSN Referring MD: Irish Lack MD PCP: Cecilio Asper Supervising MD: Rockey Situ Indication 1: Atrial Fibrillation (ICD-427.31) Indication 2: Atrial Flutter (ICD-427.32) Lab Used: [facility] Site: [place] INR POC 2.5 INR RANGE 2.0-3.0  Dietary changes: no    Health status changes: no    Bleeding/hemorrhagic complications: no    Recent/future hospitalizations: no    Any changes in medication regimen? no    Recent/future dental: no  Any missed doses?: no       Is patient compliant with meds? yes       Allergies: 1)  ! Penicillin  Anticoagulation Management History:      The patient is taking warfarin and comes in today for a routine follow up visit.  Positive risk factors for bleeding include an age of 75 years or older.  Negative risk factors for bleeding include no history of CVA/TIA.  The bleeding index is 'intermediate risk'.  Positive CHADS2 values include History of HTN and Age > 75 years old.  Negative CHADS2 values include History of Diabetes and Prior Stroke/CVA/TIA.  The start date was 04/12/2003.  His last INR was 2.7.  Anticoagulation responsible provider: [facility].  INR POC: 2.5.  Cuvette Lot#: [id number].  Exp: 02/2011.    Anticoagulation Management Assessment/Plan:      The patient's current anticoagulation dose is Warfarin sodium 4 mg tabs: take 1/2 a tablet every day.  The target INR is 2 - 3.  The next INR is due 04/16/2010.  Anticoagulation instructions were given to patient.  Results were reviewed/authorized by Freddrick March, RN, BSN.  He was notified by Freddrick March RN.         Prior Anticoagulation Instructions: INR 2.6 Continue 2mg  everyday except 1mg s on Tuesdays and Saturdays. Recheck in 4 weeks.   Current Anticoagulation Instructions: INR 2.5  Continue on same dosage 1 tablet daily except 1/2 tablet on Tuesdays and Saturdays.  Recheck in  4 weeks.

## 2010-05-01 NOTE — Medication Information (Signed)
Summary: rov/ewj  Anticoagulant Therapy  Managed by: Tula Nakayama, RN, BSN Referring MD: Irish Lack MD PCP: Cecilio Asper Supervising MD: Rockey Situ Indication 1: Atrial Fibrillation (ICD-427.31) Indication 2: Atrial Flutter (ICD-427.32) Lab Used: [facility] Site: [place] INR POC 2.6 INR RANGE 2.0-3.0  Dietary changes: no    Health status changes: no    Bleeding/hemorrhagic complications: no    Recent/future hospitalizations: no    Any changes in medication regimen? no    Recent/future dental: no  Any missed doses?: no       Is patient compliant with meds? yes       Allergies: 1)  ! Penicillin  Anticoagulation Management History:      The patient is taking warfarin and comes in today for a routine follow up visit.  Positive risk factors for bleeding include an age of 75 years or older.  Negative risk factors for bleeding include no history of CVA/TIA.  The bleeding index is 'intermediate risk'.  Positive CHADS2 values include History of HTN and Age > 75 years old.  Negative CHADS2 values include History of Diabetes and Prior Stroke/CVA/TIA.  The start date was 04/12/2003.  His last INR was 2.7.  Anticoagulation responsible provider: Gila Lauf.  INR POC: 2.6.  Cuvette Lot#: [id number].  Exp: 05/2011.    Anticoagulation Management Assessment/Plan:      The patient's current anticoagulation dose is Warfarin sodium 4 mg tabs: take 1/2 a tablet every day.  The target INR is 2 - 3.  The next INR is due 05/14/2010.  Anticoagulation instructions were given to patient.  Results were reviewed/authorized by Tula Nakayama, RN, BSN.  He was notified by Tula Nakayama, RN, BSN.         Prior Anticoagulation Instructions: INR 2.5  Continue on same dosage 1 tablet daily except 1/2 tablet on Tuesdays and Saturdays.  Recheck in 4 weeks.   Current Anticoagulation Instructions: INR 2.6 Continue 2mg  everyday except 1mg s on Tuesdays and Saturdays.  Recheck in 4 weeks.

## 2010-05-14 ENCOUNTER — Encounter (INDEPENDENT_AMBULATORY_CARE_PROVIDER_SITE_OTHER): Payer: Medicare Other

## 2010-05-14 ENCOUNTER — Encounter: Payer: Self-pay | Admitting: Cardiovascular Disease

## 2010-05-14 DIAGNOSIS — Z7901 Long term (current) use of anticoagulants: Secondary | ICD-10-CM

## 2010-05-14 DIAGNOSIS — I4892 Unspecified atrial flutter: Secondary | ICD-10-CM

## 2010-05-14 DIAGNOSIS — I4891 Unspecified atrial fibrillation: Secondary | ICD-10-CM

## 2010-05-20 ENCOUNTER — Ambulatory Visit (INDEPENDENT_AMBULATORY_CARE_PROVIDER_SITE_OTHER): Payer: Medicare Other

## 2010-05-20 ENCOUNTER — Encounter: Payer: Self-pay | Admitting: Family Medicine

## 2010-05-20 DIAGNOSIS — E538 Deficiency of other specified B group vitamins: Secondary | ICD-10-CM

## 2010-05-21 NOTE — Medication Information (Signed)
Summary: ROV/TM/AMD  Anticoagulant Therapy  Managed by: Tula Nakayama, RN, BSN Referring MD: Irish Lack MD PCP: Cecilio Asper Supervising MD: Rockey Situ Indication 1: Atrial Fibrillation (ICD-427.31) Indication 2: Atrial Flutter (ICD-427.32) Lab Used: [facility]  Site: [place] INR POC 2.9 INR RANGE 2.0-3.0  Dietary changes: no    Health status changes: no    Bleeding/hemorrhagic complications: no    Recent/future hospitalizations: no    Any changes in medication regimen? no    Recent/future dental: no  Any missed doses?: no       Is patient compliant with meds? yes       Allergies: 1)  ! Penicillin  Anticoagulation Management History:      The patient is taking warfarin and comes in today for a routine follow up visit.  Positive risk factors for bleeding include an age of 75 years or older.  Negative risk factors for bleeding include no history of CVA/TIA.  The bleeding index is 'intermediate risk'.  Positive CHADS2 values include History of HTN and Age > 75 years old.  Negative CHADS2 values include History of Diabetes and Prior Stroke/CVA/TIA.  The start date was 04/12/2003.  His last INR was 2.7.  Anticoagulation responsible provider: [facility].  INR POC: 2.9.  Cuvette Lot#: [id number].  Exp: 03/2011.    Anticoagulation Management Assessment/Plan:      The patient's current anticoagulation dose is Warfarin sodium 4 mg tabs: take 1/2 a tablet every day.  The target INR is 2 - 3.  The next INR is due 06/11/2010.  Anticoagulation instructions were given to patient.  Results were reviewed/authorized by Tula Nakayama, RN, BSN.  He was notified by Tula Nakayama, RN, BSN.         Prior Anticoagulation Instructions: INR 2.6 Continue 2mg  everyday except 1mg s on Tuesdays and Saturdays. Recheck in 4 weeks.   Current Anticoagulation Instructions: INR 2.9 Continuue 2mg s everyday except 1mg s on Tuesdays and Saturdays. Recheck in 4 weeks.

## 2010-05-27 NOTE — Assessment & Plan Note (Signed)
Summary: B-12//[name] [name]//JRT   Nurse Visit   Allergies: 1)  ! Penicillin  Medication Administration  Injection # 1:    Medication: Vit B12 1000 mcg    Diagnosis: B12 DEFICIENCY (ICD-266.2)    Route: IM    Site: R deltoid    Exp Date: 01/28/2012    Lot #: [id number]    Mfr: [place]    Comments: Per Dr. Lorelei Pont    Patient tolerated injection without complications    Given by: Maudie Mercury Dance CMA Deborra Medina) (May 20, 2010 8:39 AM)  Orders Added: 1)  Admin of Therapeutic Inj  intramuscular or subcutaneous [[id number]] 2)  Vit B12 1000 mcg [id number]

## 2010-06-11 ENCOUNTER — Encounter: Payer: Self-pay | Admitting: Cardiovascular Disease

## 2010-06-11 ENCOUNTER — Encounter (INDEPENDENT_AMBULATORY_CARE_PROVIDER_SITE_OTHER): Payer: Medicare Other

## 2010-06-11 DIAGNOSIS — Z7901 Long term (current) use of anticoagulants: Secondary | ICD-10-CM

## 2010-06-11 DIAGNOSIS — I4892 Unspecified atrial flutter: Secondary | ICD-10-CM

## 2010-06-11 DIAGNOSIS — I4891 Unspecified atrial fibrillation: Secondary | ICD-10-CM

## 2010-06-16 ENCOUNTER — Ambulatory Visit (INDEPENDENT_AMBULATORY_CARE_PROVIDER_SITE_OTHER): Payer: Medicare Other

## 2010-06-16 ENCOUNTER — Encounter: Payer: Self-pay | Admitting: Family Medicine

## 2010-06-16 DIAGNOSIS — E538 Deficiency of other specified B group vitamins: Secondary | ICD-10-CM

## 2010-06-17 NOTE — Medication Information (Signed)
Summary: rov/tm  Anticoagulant Therapy  Managed by: Freddrick March, RN, BSN Referring MD: Irish Lack MD PCP: Cecilio Asper Supervising MD: Rockey Situ Indication 1: Atrial Fibrillation (ICD-427.31) Indication 2: Atrial Flutter (ICD-427.32) Lab Used: [facility] Site: [place] INR POC 1.8 INR RANGE 2.0-3.0  Dietary changes: yes       Details: Incr in vit K foods in the past week.    Health status changes: no    Bleeding/hemorrhagic complications: no    Recent/future hospitalizations: no    Any changes in medication regimen? no    Recent/future dental: yes     Details: dental cleaning coming up.  Any missed doses?: no       Is patient compliant with meds? yes       Allergies: 1)  ! Penicillin  Anticoagulation Management History:      The patient is taking warfarin and comes in today for a routine follow up visit.  Positive risk factors for bleeding include an age of 20 years or older.  Negative risk factors for bleeding include no history of CVA/TIA.  The bleeding index is 'intermediate risk'.  Positive CHADS2 values include History of HTN and Age > 40 years old.  Negative CHADS2 values include History of Diabetes and Prior Stroke/CVA/TIA.  The start date was 04/12/2003.  His last INR was 2.7.  Anticoagulation responsible provider: Zennie Ayars.  INR POC: 1.8.  Cuvette Lot#: [id number].  Exp: 03/2011.    Anticoagulation Management Assessment/Plan:      The patient's current anticoagulation dose is Warfarin sodium 4 mg tabs: take 1/2 a tablet every day.  The target INR is 2 - 3.  The next INR is due 07/09/2010.  Anticoagulation instructions were given to patient.  Results were reviewed/authorized by Freddrick March, RN, BSN.  He was notified by Freddrick March RN.         Prior Anticoagulation Instructions: INR 2.9 Continuue 2mg s everyday except 1mg s on Tuesdays and Saturdays. Recheck in 4 weeks.   Current Anticoagulation Instructions: INR  1.8  Take 1.5 tablets today, then resume same dosage 1 tablet daily except 1/2 tablet on Tuesdays and Saturdays.  Recheck in 4 weeks.

## 2010-06-18 ENCOUNTER — Other Ambulatory Visit: Payer: Self-pay | Admitting: *Deleted

## 2010-06-18 MED ORDER — LEVOTHYROXINE SODIUM 100 MCG PO TABS: 100.0000 ug | ORAL_TABLET | Freq: Every day | ORAL | Status: DC

## 2010-06-18 MED ORDER — AMIODARONE HCL 100 MG PO TABS: ORAL_TABLET | ORAL | Status: DC

## 2010-06-18 MED ORDER — PANTOPRAZOLE SODIUM 40 MG PO TBEC: 40.0000 mg | DELAYED_RELEASE_TABLET | Freq: Every day | ORAL | Status: DC

## 2010-06-18 MED ORDER — ALENDRONATE SODIUM 70 MG PO TABS: 70.0000 mg | ORAL_TABLET | ORAL | Status: DC

## 2010-06-20 ENCOUNTER — Ambulatory Visit: Payer: Medicare Other

## 2010-06-20 ENCOUNTER — Telehealth: Payer: Self-pay | Admitting: *Deleted

## 2010-06-20 NOTE — Telephone Encounter (Signed)
I would like to defer this to Dr. Lovena Le. Typically our cardiologists manage amiodarone, etc, and I would always defer to their judgement with antiarrhythmics.  Will send this message to Dr. Lovena Le.

## 2010-06-20 NOTE — Telephone Encounter (Signed)
Received faxed needing clarification on two Rx's.  Patient is either taking both medications or going back and forth between the two.  Paceron 100mg , take one tablet daily except for Saturday and Sunday.  Amiodarone 200mg , take one tablet every day except take 1/2 tablet on Saturday and Sunday.  Please advise.  Fax in your IN box.

## 2010-06-24 ENCOUNTER — Telehealth: Payer: Self-pay | Admitting: Emergency Medicine

## 2010-06-24 ENCOUNTER — Encounter: Payer: Self-pay | Admitting: Internal Medicine

## 2010-06-24 DIAGNOSIS — Z7901 Long term (current) use of anticoagulants: Secondary | ICD-10-CM

## 2010-06-24 DIAGNOSIS — I4891 Unspecified atrial fibrillation: Secondary | ICD-10-CM

## 2010-06-24 NOTE — Telephone Encounter (Signed)
Ok

## 2010-06-24 NOTE — Telephone Encounter (Signed)
[facility] pharmacy called stating that [place] changed the patients Amiodarone to 100mg  1 tablet daily except for Saturday and Sunday. The dosage that we prescribed was for 200mg  1 tablet daily except for 1/2 tablet Saturday and Sunday. [facility] wanted to know if it is ok that the patient's amiodarone dosage has changed. [place] office wanted to [place] to make sure this was okay. Please advise, Thanks Clarise Cruz.

## 2010-06-25 NOTE — Telephone Encounter (Signed)
Called [facility] and verified script/sab

## 2010-06-26 NOTE — Assessment & Plan Note (Signed)
Summary: b-12 copalnd jrt   Nurse Visit   Allergies: 1)  ! Penicillin  Medication Administration  Injection # 1:    Medication: Vit B12 1000 mcg    Diagnosis: B12 DEFICIENCY (ICD-266.2)    Route: IM    Site: L deltoid    Exp Date: 12/29/2011    Lot #: [id number]    Mfr: [place]    Patient tolerated injection without complications    Given by: Ozzie Hoyle LPN (March 19, [id number] [id number] AM)  Orders Added: 1)  Vit B12 1000 mcg [J3420] 2)  Admin of Therapeutic Inj  intramuscular or subcutaneous [id number]

## 2010-07-09 ENCOUNTER — Ambulatory Visit (INDEPENDENT_AMBULATORY_CARE_PROVIDER_SITE_OTHER): Payer: Medicare Other | Admitting: Emergency Medicine

## 2010-07-09 DIAGNOSIS — Z7901 Long term (current) use of anticoagulants: Secondary | ICD-10-CM

## 2010-07-09 DIAGNOSIS — I4891 Unspecified atrial fibrillation: Secondary | ICD-10-CM

## 2010-07-17 ENCOUNTER — Ambulatory Visit (INDEPENDENT_AMBULATORY_CARE_PROVIDER_SITE_OTHER): Payer: Medicare Other | Admitting: Family Medicine

## 2010-07-17 DIAGNOSIS — E538 Deficiency of other specified B group vitamins: Secondary | ICD-10-CM

## 2010-07-17 MED ORDER — CYANOCOBALAMIN 1000 MCG/ML IJ SOLN
1000.0000 ug | Freq: Once | INTRAMUSCULAR | Status: AC
  Administered 2010-07-17: 1000 ug via INTRAMUSCULAR

## 2010-07-17 NOTE — Progress Notes (Signed)
  Subjective:    Patient ID: [name] [name], male    DOB: [birth year], 77 y.o.   MRN: [id number]  HPI b12 shot   Review of Systems     Objective:   Physical Exam        Assessment & Plan:

## 2010-08-06 ENCOUNTER — Ambulatory Visit (INDEPENDENT_AMBULATORY_CARE_PROVIDER_SITE_OTHER): Payer: Medicare Other | Admitting: Emergency Medicine

## 2010-08-06 DIAGNOSIS — Z7901 Long term (current) use of anticoagulants: Secondary | ICD-10-CM

## 2010-08-06 DIAGNOSIS — I4891 Unspecified atrial fibrillation: Secondary | ICD-10-CM

## 2010-08-06 LAB — POCT INR: INR: 1.8

## 2010-08-12 ENCOUNTER — Encounter: Payer: Self-pay | Admitting: Emergency Medicine

## 2010-08-12 NOTE — Assessment & Plan Note (Signed)
[facility]                         ELECTROPHYSIOLOGY OFFICE NOTE   [name] [name]                      MRN:          [id number]  DATE:11/04/2006                            DOB:          [birth year]    [name] returns today for followup.  He is a very pleasant 77 year old  man with a history of symptomatic bradycardia and paroxysmal AFib who is  status post permanent pacemaker implantation.  He returns today for  followup and is doing well.  He denies chest pain or shortness of breath  and has felt no palpitations.   PHYSICAL EXAMINATION:  GENERAL:  He is pleasant, very well-appearing,  elderly man in no distress.  VITAL SIGNS:  His blood pressure was 100/61; the pulse was 72 and  regular; the respirations were 18; the weight was 173 pounds.  NECK:  Revealed no jugular venous distention.  LUNGS:  Clear bilaterally to auscultation.  No wheezes, rales, or  rhonchi were present.  CARDIOVASCULAR:  Revealed a regular rate and rhythm with normal S1 and  S2.  EXTREMITIES:  Demonstrated no edema.   MEDICATIONS:  1. Warfarin as directed.  2. Amiodarone 200 a day.  3. Protonix 40 a day.  4. Synthroid 0.5 mg daily.  5. Caduet.  6. Fosamax.   Interrogation of his defibrillator demonstrates a Medtronic InRhythm  with P waves of 1 and R waves of 6.  The impedance was 512 in the  atrium, 592 in the ventricle.  The threshold of volt 0.4 both in the  atrium and the ventricle.  Battery voltage was 3.02 volts.   IMPRESSION:  1. Symptomatic bradycardia.  2. Paroxysmal atrial fibrillation.  3. Status post pacemaker insertion.  4. Chronic Coumadin therapy.  5. Chronic amiodarone therapy.   DISCUSSION:  Overall, [name] [name] is stable and his pacemaker is working  normally.  We will plan to see him back in our [facility] in 6  months and he will follow up with me in 1 year.     [name] [name]. [name] Le, MD  Electronically Signed    GWT/MedQ  DD:  11/04/2006  DT: 11/04/2006  Job #: [id number]   cc:   [name] [name]

## 2010-08-12 NOTE — Progress Notes (Signed)
[facility] OFFICE [facility]   JAVONNE, BORKE                      MRN:          [id number]  DATE:04/22/2007                            DOB:          1923-01-07    Mr. Graeff returns today for followup.  He is a very pleasant elderly  male with a history of symptomatic bradycardia and paroxysmal AFib, who  is status post pacemaker implantation back in May 2007.  He returns  today for followup.  Overall, in the last year, he has done well.  He  denies hospitalization.  He has no chest pain or shortness of breath.  He denies palpitations.  He has very mild peripheral edema.  He  continues to walk 3 times per week, and is otherwise quite active.   PHYSICAL EXAM:  He is a pleasant well-appearing elderly man in no acute  distress.  The blood pressure today was 100/66, the pulse was 68 and regular,  respirations were 18, the weight was 175 pounds.  NECK:  Revealed no jugular venous distention.  LUNGS:  Clear bilaterally to auscultation.  No wheezes, rales or rhonchi  are present.  CARDIOVASCULAR EXAM:  Revealed a regular rate and rhythm with normal S1  and S2.  There are no murmurs, rubs or gallops that I could appreciate.  EXTREMITIES:  Demonstrated trace peripheral edema in the right leg, 1+  peripheral edema in the left leg.   MEDICATIONS:  1. Warfarin 1 mg daily.  2. Amiodarone 200 mg a day.  3. Protonix 40 a day.  4. Synthroid 0.5 a day.  5. Caduet 2.5/10 a day.  6. Multiple vitamins.   Interrogation of his pacemaker demonstrates a Medtronic in rhythm with P  and R-waves of 2 and 5 respectively.  The impedance 472 in A, 750 in the  B.  Threshold is a volt of 0.4 both in the right atrium and in the right  ventricle.  The battery voltage was 3 volts.  He is 99% A-paced.  There  were no mode-switching episodes indicative that there is no AFib.   IMPRESSION:  1. Paroxysmal atrial fibrillation.  2. Bradycardia.  3. Status post pacemaker insertion.   DISCUSSION:  Overall, Mr. Benezra is stable.  His pacemaker is working  normally.  I have asked that he decrease his amiodarone to 100 mg on the  weekends and 200 mg during the day.  He will follow up with [place] in a  year.     Champ Mungo. Lovena Le, MD  Electronically Signed    GWT/MedQ  DD: 04/22/2007  DT: 04/23/2007  Job #: [id number]   cc:   Lorelee Market

## 2010-08-12 NOTE — Progress Notes (Signed)
[facility]   MARCOANTONIO, GESSLER                      MRN:          [id number]  DATE:04/02/2008                            DOB:          1923-01-23    [name] returns today for followup.  He is a very pleasant 77 year old  male with a history of paroxysmal AFib as well as symptomatic  bradycardia.  He is status post permanent pacemaker insertion.  He  returns today for followup.  The patient denies chest pain or shortness  of breath.  He continues to be quite active walking on a regular basis  and keeping up in his house.   MEDICATIONS:  1. Warfarin 1 mg as directed.  2. Protonix 40 a day.  3. Synthroid 0.5 mg daily.  4. Caduet 2.5/10 daily.  5. Ferrous sulfate.  6. Metamucil.  7. Amiodarone 200 a day, Monday through Friday and 100 a day, Saturday      and Sunday.  8. He takes Lasix p.r.n.   PHYSICAL EXAMINATION:  GENERAL:  He is a pleasant, well-appearing  elderly man in no acute distress.  VITAL SIGNS:  The blood pressure was 126/62, the pulse was 65 and  regular, respirations were 18, weight was 183 pounds.  NECK:  No jugular venous distention.  LUNGS:  Clear bilaterally to auscultation.  No wheezes, rales, or  rhonchi are present.  There is no increased work of breathing.  CARDIOVASCULAR:  Regular rate and rhythm.  Normal S1 and S2.  I did not  appreciate any murmurs today.  ABDOMEN:  Soft, nontender.  EXTREMITIES:  Trace peripheral edema on the left, none on the right.   His EKG demonstrates atrial pacing with ventricular sensing.   Interrogation of his pacemaker demonstrates a Medtronic end rhythm.  P-  waves were 1 mV, the R-waves were almost 5 mV, the impedance 408 in the  atrium, 648 in the ventricle.  The threshold was 1 V at 0.4 in the A ,  0.5 at 0.4 in the V.  He was 99% A-paced.  He had 16-mode switches, the  longest of which was approximately 5 hours.   IMPRESSION:  1. Paroxysmal atrial fibrillation.  2. Symptomatic bradycardia.  3. Status post pacemaker insertion.  4. Chronic Coumadin therapy.   DISCUSSION:  Mr. Kilkenny is stable.  His pacemaker is working normally.  His heart is maintaining sinus rhythm greater than 99.5% of the time.  I  will plan to see the patient back in the office for pacemaker followup  in 1 year or sooner should he have additional problems.     Champ Mungo. Lovena Le, MD  Electronically Signed    GWT/MedQ  DD: 04/02/2008  DT: 04/03/2008  Job #: [id number]   cc:   Lorelee Market

## 2010-08-15 NOTE — Op Note (Signed)
NAME:  [name] [name], [name] [name] NO.:  [id number]   MEDICAL RECORD NO.:  [id number]                   PATIENT TYPE:  INP   LOCATION:  3705                                 FACILITY:  [facility]   PHYSICIAN:  Delfin Edis, M.D. [facility]               DATE OF BIRTH:  12-22-1922   DATE OF PROCEDURE:  DATE OF DISCHARGE:                                 OPERATIVE REPORT   PROCEDURE:  Colonoscopy.   INDICATIONS:  This 77 year old gentleman was admitted with a large volume  lower GI bleed, passing maroon stools. He presented with a syncopal episode,  hypotension and a hemoglobin of 9 gm. He has required 3 units of packed  cells. The bleeding initially stopped for 24 to 48 hours, but he had a  rebleed this morning.   Two months ago the patient underwent repair  of recurrent left inguinal  hernia by Dr. Eden Lathe with findings of infection within the site of the mesh.  The etiology of the infection was not clear. The patient had a history of  radiation previously for prostatic cancer. He also already had 1  diverticular bleed in the past. Because of the bleeding, left lower quadrant  abdominal pain as well as ongoing induration in the left inguinal groin, we  have discussed with Dr. Hassell Done examination of the left colon to rule out  fistulization or ulceration of  the colon.   ENDOSCOPE:  Fujinon single-channel endoscope.   SEDATION:  Versed 4 mg IV, fentanyl 40 micrograms IV.   FINDINGS:  The Fujinon single-channel endoscope was passed into the  patient's rectum to the sigmoid colon. The patient was  monitored by pulse  oximetry and his O2 saturations were normal, but his blood pressure dropped  to 98 systolic. His pulse was up to 100. His prep was poor because his colon  was full of blood.   There was a rectal stricture that was known from previous examination. It  had a small ulceration but did not show any stigmata of bleeding. The rectal  ampulla itself was normal and the  colon appeared normal up to the level of  20 cm from the rectum.   At about 20 cm there were multiple diverticula with tortuous lumen with  multiple diverticula, some of them rather large-mouthed. There was almost  bright red blood or quite fresh blood coating the mucosa of the sigmoid  colon. As the colonoscope was advanced through the descending colon to the  splenic flexure, the blood in the diverticula was darker and darker until in  the transverse colon and the hepatic flexure was almost black. There was no  actively bleeding site noted. There were no polyps or ulcerations,  especially in the vicinity of the inguinal hernia. The bowel showed only  severe diverticular process without obvious inflammatory changes.   The colonoscope reached about mid ascending colon where it was apparent that  the blood was dark and old, probably from reflux from the left colon.  Therefore the colonoscope was then retracted. It was again noted that in the  left colon and sigmoid colon the blood was much brighter, but despite  irrigation of  the lumen we could not identify any actively bleeding  lesions.   IMPRESSION:  1. Severe diverticulosis of the left colon with active bleeding most likely     from the left colon.  2. Rectal stricture, not bleeding.    PLAN:  1. Continue on bowel rest and clear liquids.  2. Transfuse as needed to maintain hemoglobin of 8.5 gm.  3. Check bleeding time.                                               Delfin Edis, M.D. [facility]    DB/MEDQ  D:  06/15/2002  T:  06/16/2002  Job:  [id number]   cc:   Isabel Caprice Hassell Done, M.D.  [id number], [address]  [place] [address]  Fax: [phone]

## 2010-08-15 NOTE — Assessment & Plan Note (Signed)
[facility]                         GASTROENTEROLOGY OFFICE NOTE   HUSAYN, CAPPELLO                      MRN:          [id number]  DATE:06/08/2006                            DOB:          1923-03-11    [name] [name] has chronic diverticulosis with a previous GI hemorrhage.  He  also had radiation proctitis related to treatment for his prostate  cancer and is on chronic iron therapy.  He has a history of previous  peptic ulcer disease and is on chronic Protonix 40 mg a day because of  his need for chronic anticoagulation.  He recently had a pacemaker  placed by Dr. Percival Spanish.   He is on multiple medications, listed and reviewed in his chart.  He  denies any GI complaints at this time except for drainage of a right  inguinal hernia after repair with mesh insertion in September 2003 by  Dr. Hassell Done.  The patient originally had blood work done this past year  which showed a normal CBC and stable iron levels.  His last colonoscopy  and endoscopic exams were in 2003.   PHYSICAL EXAMINATION:  GENERAL:  He is a healthy-appearing white male  appearing his stated age, in no acute distress.  CHEST:  Clear.  CARDIAC:  He appears to be in a regular rhythm without significant  murmurs, gallops or rubs.  ABDOMEN:  I cannot appreciate hepatosplenomegaly, abdominal masses or  tenderness.  Bowel sounds are normal.  EXTREMITIES:  The upper extremities are unremarkable.  NEUROLOGIC:  Mental status is clear.   RECOMMENDATIONS:  I have renewed [name] [name] Protonix and I have asked  him to follow a high-fiber diet as best as possible.  He obviously has  some intermittent blood loss from his radiation proctitis and is  continuing low-dose iron therapy with p.r.n. blood count follow-ups as  needed.  I see no need for repeat endoscopic exams at this time.     Loralee Pacas. Sharlett Iles, MD, [facility], [place]  Electronically Signed    DRP/MedQ  DD: 06/08/2006  DT: 06/10/2006   Job #: [id number]   cc:   Lorelee Market

## 2010-08-15 NOTE — Op Note (Signed)
[name], [name]               ACCOUNT NO.:  [id number]   MEDICAL RECORD NO.:  [id number]          PATIENT TYPE:  INP   LOCATION:  2022                         FACILITY:  [facility]   PHYSICIAN:  Minus Breeding, M.D.   DATE OF BIRTH:  May 04, 1922   DATE OF PROCEDURE:  08/21/2005  DATE OF DISCHARGE:  08/22/2005                                 OPERATIVE REPORT   OPERATIVE PROCEDURE:  Permanent dual-chamber pacemaker.   PREOPERATIVE DIAGNOSES:  Sick sinus syndrome, tachy-brady syndrome, syncope.   POSTOPERATIVE DIAGNOSES:  Sick sinus syndrome, tachy-brady, syncope.   PROCEDURE NOTE:  Appropriate informed consent was obtained.  The patient was  brought to the EP lab.  His left subclavian area was prepped in a sterile  fashion.  Lidocaine was utilized for subcutaneous anesthesia.  Following  this, a subcutaneous incision was made and a pocket was created down to the  prepectoral fascia using blunt trauma.  Appropriate hemostasis was obtained  using electrocautery and compression.  Attention was then turned to venous  access.  Under fluoroscopy guidance and using contrast manography, an  introducer needle was inserted via the modified Seldinger technique using  one puncture.  Guide wire was inserted through the needle and followed under  fluoroscopy.  I repeated this procedure for a second retained wire.  I  utilized safe hemostatic sheets.  Attention was then turned to placing the  ventricular lead in the RV apex under fluoroscopic guidance.  The lead was a  Medtronic 5076 52-cm, serial [id number].  The lead impedance was 1192 ohms.  Threshold was 0.8 volts at 0.5 milliseconds.  The current was 0.9 milliamps.  The R wave was 7.9 millivolts.  There was no diaphragmatic stimulation at 10  volts.  This was an active fixation lead which was screwed into the apex  under fluoroscopy.  The lead was then sutured to the prepectoral fascia  using a sewing sleeve and a silk suture.  Attention was  then turned to the  atrial lead.  This was a Medtronic active fixation 5076 45-cm, serial  [id number].  The impedance was 584 ohms.  Threshold was 0.8 volts at 0.5  milliamps.  The current was 1.6.  The P wave measured 1.9.  Again, there was  no diaphragmatic stimulation at 10 volts.  The lead was then sewn to the  prepectoral fascia with the sewing sheath and a silk popoff suture.  The  pocket was then copiously irrigated with gentamicin solution.  It was  visually inspected to assure hemostasis.  A Medtronic Enrhythm [id number],  serial [id number] was inserted into the pocket after assuring appropriate  secure connection.  The pocket was then again irrigated.  The pocket was  closed using 2-0 Vicryl in a vertical mattress followed by the same suture  in a horizontal mattress.  The third layer of closure utilized 4-0 Vicryl  for subcutaneous running horizontal stitch.   The patient tolerated the procedure well.  There were no apparent  complications.  The device was interrogated again after the procedure and  was found to be in good position with excellent readings.  The patient was  returned to his room.           ______________________________  Minus Breeding, M.D.     JH/MEDQ  D:  08/21/2005  T:  08/23/2005  Job:  [id number]   cc:   Lorelee Market  Fax: [phone]   Champ Mungo. Lovena Le, M.D.  [address] [place]  [place] [address]

## 2010-08-15 NOTE — Discharge Summary (Signed)
[place]  Patient:    DAL, FLAUGH Visit Number: [id number] MRN: [id number]          Service Type: CAT Location: 2000 2036 01 Attending Physician:  Cristopher Peru Dictated by:   Forest Becker, N.P. Admit Date:  07/06/2001 Discharge Date: 07/07/2001   CC:         Champ Mungo. Lovena Le, M.D. [facility]  Wynona Dove, [place]  Dr. Brunetta Genera; [facility]   Discharge Summary  PRIMARY DIAGNOSIS:  Atrial flutter.  HISTORY OF PRESENT ILLNESS:  This is a 77 year old gentleman with past medical history of atrial flutter, hyperlipidemia, and prostate cancer, who was status ablation two years ago and now presents with recurrent palpitations.  He was scheduled for a TEE and radiofrequency catheter ablation of his atrial flutter.  He underwent TEE which showed normal LV function.  No clot was seen. He then underwent radiofrequency catheter ablation of his atrial flutter.  The patient had inducible atrial fibrillation.  He tolerated the procedure well and had no immediate postoperative complications, and was discharged to home the following day in stable condition.  He was discharged to home on the following medications.  DISCHARGE MEDICATIONS: 1. Coumadin 5 mg tonight and tomorrow, then 2.5 mg nightly. 2. Amiodarone 200 mg daily. 3. Synthroid 50 mcg daily. 4. Flomax 0.4 mg nightly. 5. Pravachol 10 mg nightly. 6. Timoptic one drop, both eyes nightly. 7. Coated aspirin 81 mg daily for six weeks. 8. Hydrocortisone suppository 25 mg b.i.d.  INSTRUCTIONS: 1. He was not to do any heavy lifting or strenuous activity for four days    and no driving for two. 2. Low fat, low cholesterol, low salt diet. 3. He was allowed to shower. 4. He is to call if any lumps develop in his groin, or any drainage. 5. A PT-INR was scheduled at the [facility] Coumadin clinic on Monday, April    14 at 9:30 a.m. and at that time, he was also to have PFTs and LFTs    drawn. 6. He was to  follow with myself, Forest Becker, at [facility] in six weeks and    the office will call with that appointment. 7. A pulmonary function test is scheduled for April 15 at 12 noon to    assess the patients baseline with new amiodarone start. 8. Please note that the patient will need a follow-up appointment at some    point with Dr. Lovena Le and that will be scheduled at his follow-up    appointment in six weeks. Dictated by:   Forest Becker, N.P. Attending Physician:  Cristopher Peru DD:  07/07/01 TD:  07/08/01 Job: [id number]

## 2010-08-15 NOTE — Discharge Summary (Signed)
NAMEDEMOND, TAGUCHI               ACCOUNT NO.:  [id number]   MEDICAL RECORD NO.:  [id number]          PATIENT TYPE:  INP   LOCATION:  2022                         FACILITY:  [facility]   PHYSICIAN:  Glori Bickers, M.D. LHCDATE OF BIRTH:  06/13/1922   DATE OF ADMISSION:  08/19/2005  DATE OF DISCHARGE:  08/22/2005                                 DISCHARGE SUMMARY   PROCEDURES:  Insertion of dual-lead Medtronic permanent pacemaker.   PRIMARY DIAGNOSIS:  Syncope.   SECONDARY DIAGNOSES:  1.  Paroxysmal atrial fibrillation with rapid ventricular response.  2.  History of atrial flutter status post ablation in 2001 and 1003  3.  Hyperlipidemia.  4.  Remote history of syncope as well.  5.  Chronic anticoagulation with Coumadin.  6.  Mild mitral regurgitation by echocardiogram.  7.  Prostate cancer status post radiation seed implants.  8.  Remote history of peptic ulcer disease.  9.  History of repeat inguinal hernia repair most recently in Jul 29, 2005,      secondary to chronic infection.   Time at discharge: 27 minutes.   HOSPITAL COURSE:  [name] is an 77 year old male with a history of  paroxysmal atrial fibrillation.  He had a syncopal episode on the day of  admission and came to the emergency room.  He had sinus rhythm as well as  paroxysmal atrial fibrillation.  He was admitted for further evaluation and  treatment.   He had post termination pauses of greater than 3 seconds when he was  converting from atrial fibrillation to sinus rhythm.  Dr. Lovena Le evaluated  him and felt that a permanent pacemaker was indicated.  This was scheduled  for Aug 21, 2005.   The permanent pacemaker was inserted on May 25.  Postprocedure chest x-ray  shows no pneumothorax and no acute disease.  His MVP mode was programmed on  an atrial sensitivity, was reprogrammed to 0.45 to eliminate intermittent  __________ overseeing.  Other values were within normal limits.   On Aug 22, 2005, Mr. Sheeks  was evaluated by Dr. Haroldine Laws.  He was atrial  pacing and stable.  He was discharged with outpatient followup arranged.   DISCHARGE INSTRUCTIONS:  1.  His activity level is to be per the discharge instruction sheet.  2.  He is to call our office for any problems with the wound.  3.  He is to follow up at the pacer clinic on June 13 at 9:00 a.m. and see      Dr. Percival Spanish in 3 months.  4.  He is to follow up with Dr. Lovena Le as needed.  5.  He is to follow up with Dr. Brunetta Genera as scheduled.   DISCHARGE MEDICATIONS:  1.  Caduet daily.  2.  Synthroid 0.05 mg daily.  3.  Protonix 40 mg daily.  4.  Coumadin 2 mg daily except for 4 mg on Tuesday and Saturday.  5.  Doxycycline 100 mg a day.  6.  Iron 325 mg daily.  7.  Timoptic eye drops at bedtime.      Rosaria Ferries, P.A. [facility]  Glori Bickers, M.D. [facility]  Electronically Signed    RB/MEDQ  D:  08/22/2005  T:  08/23/2005  Job:  [id number]   cc:   Dr. Brunetta Genera

## 2010-08-15 NOTE — Op Note (Signed)
NAMEFERDIE, [name]               ACCOUNT NO.:  [id number]   MEDICAL RECORD NO.:  [id number]          PATIENT TYPE:  INP   LOCATION:  NA                           FACILITY:  [facility]   PHYSICIAN:  Isabel Caprice. Hassell Done, MD  DATE OF BIRTH:  January 24, 1923   DATE OF PROCEDURE:  09/29/2004  DATE OF DISCHARGE:                                 OPERATIVE REPORT   PROCEDURE:  Laparoscopy and open removal of mesh from left lower quadrant.   SURGEON:  Isabel Caprice. Hassell Done, MD.   ASSISTANTEdsel Petrin. Dalbert Batman, M.D.   ANESTHESIA:  General tracheal.   DESCRIPTION OF PROCEDURE:  [name] [name] underwent a unilateral left  inguinal hernia in September 2001. In June 2002, he had a prostate  ultrasound and biopsy, followed by 5 weeks of radiation for prostate cancer  in August 2002. He then had seeds placed in October 2002. He underwent a TEE  and a heart catheter ablation for atrial flutter in April 2003, followed by  endoscopy and colonoscopy for rectal bleeding, radiation proctitis and  diverticulosis in June 2003. In September 2003, he was found to have an  abscess of his left lower quadrant and underwent drainage of abscess and  removal of some infected mesh at this point. He had a 4-unit rectal bleeding  from diverticulosis in March 2004 and has had subsequent the removal of more  mesh in October 2005, but despite this, he has continued to have an infected  sinus in his left lower quadrant. He is brought back this time to look in to  make sure he does not have a colocutaneous connection or fistula versus just  an infected draining sinus.   After general anesthesia was administered, access was gained to the abdomen  into the left upper quadrant using the OptiView 0-degree 10-11 mm port. Once  entered, the abdomen was insufflated and then I surveyed the abdomen  carefully. He has had previous gastric surgery in the midline. In his right  lower quadrant, I could see where the hernia defect was and it  appeared to  probably be recurrence of the floor weakness. However, I did not see any  evidence of sigmoid colon stuck up to that area.   I then deflated and then went anteriorly and cut down about a 2-inch opening  removing the external opening of the little sinus. I carried this down deep  and it basically went down to where the mesh was anchored to the pubis below  and medially. Then Dr. Dalbert Batman exposed this nicely and I used curved Mayo  scissors to cut out all the visible mesh  and suture that I could. Nothing seemed to remain. I irrigated it with  saline. I packed this with 1/2-inch Iodoform gauze, did staple the lateral  margin of the skin. Dressings were applied. The patient was taken to the  recovery room in satisfactory condition.       MBM/MEDQ  D:  09/29/2004  T:  09/29/2004  Job:  [id number]   cc:   Lorelee Market  [address]  [employer]  [place] [address]  Fax: [phone]

## 2010-08-15 NOTE — Discharge Summary (Signed)
NAME:  [name] [name], [name] [name]                         ACCOUNT NO.:  [id number]   MEDICAL RECORD NO.:  [id number]                   PATIENT TYPE:  INP   LOCATION:  3705                                 FACILITY:  [facility]   PHYSICIAN:  Malcolm T. Fuller Plan, M.D. [facility]          DATE OF BIRTH:  Aug 13, 1922   DATE OF ADMISSION:  06/13/2002  DATE OF DISCHARGE:  06/21/2002                                 DISCHARGE SUMMARY   ADMISSION DIAGNOSES:  1. Acute painless lower gastrointestinal bleed consistent with diverticular     hemorrhage, rule out bleed secondary to radiation proctitis, although     this is a less likely diagnosis.  Rule out complication of left inguinal     hernia abscess resulting in fistulization, again much less likely.  2. Syncope secondary to hypovolemia associated with gastrointestinal bleed.  3. Acute anemia secondary to gastrointestinal bleed.  4. Know diverticulosis.  5. Remote history of peptic ulcer disease with gastrointestinal bleed in     1975, status post Billroth I.  6. Status post multiple left inguinal hernia repairs.  One done in 1994,     another about a year ago.  7. History of abscessed left inguinal hernia repair drained in February     2004, by Dr. Kaylyn Lim.  8. Status post ablation of atrial fibrillation/flutter in April 2003.  His     cardiologist is Dr. Cristopher Peru.  9. History of cataracts and glaucoma.  10.      Allergy to penicillin which has caused rash.  11.      Hypothyroidism.   DISCHARGE DIAGNOSES:  1. Acute diverticular bleed, resolved.  2. Anemia secondary to gastrointestinal bleed, status post transfusion with     packed red blood cells during this admission.  3. History of rectal stricture, stable.  4. Status post multiple left total inguinal hernia repair with latest in     123456, and complication of abscess drained in February 2004.  5. Remote history of peptic ulcer disease in 1970s.   CONSULTATIONS:  None.   PROCEDURE:  Colonoscopy by  Dr. Delfin Edis on June 15, 2002, revealing  severe diverticulosis of the left colon with active bleeding felt most  likely from the left colon.  There was some rectal stricturing which was not  bleeding.   HISTORY OF PRESENT ILLNESS:  The patient is a 77 year old, white male known  to Dr. Verl Blalock and Dr. Cristopher Peru with primary care physician Dr.  Iven Finn at [facility].  The patient has the remote history of  gastrointestinal bleed as described above.  He has endoscopy and colonoscopy  by Dr. Verl Blalock in approximately June 2003, at which time he was  having a low-grade gastrointestinal bleed that did not require  hospitalization.  The colonoscopy of June 2003, shows extensive diverticular  changes in the transverse to sigmoid colon as well as radiation proctitis in  the rectum.  Upper  endoscopy at the same time showed a normal study with the  exception of a Billroth I anatomy.   The patient presented to the emergency room on March 16, following abrupt  onset of multiple blood bowel movements leading to a probable syncopal  episode.  He denied abdominal pain, nausea, vomiting, chest pain or anginal  symptoms and following the brief syncope was mentating clearly.  He was not  using any NSAIDs, but does take a 325 mg aspirin for cardiovascular  prophylaxis.   The patient was evaluated in the emergency room by Dr. Delfin Edis and  admitted for supportive care and management.   LABORATORY DATA AND X-RAY FINDINGS:  Initial hemoglobin was 9.9, the low was  9.3 and it was 9.6 at discharge, hematocrit low was 28.6 on the day of  discharge, MCV 87 with platelets 164,000.  PT 13.5, INR 1.0, PTT 25.  IV  bleeding time was 3.5 minutes.  Sodium 141, potassium 4.4, BUN 19,  creatinine 1.1, glucose elevated at 161 and 111 on two different specimens.  Total bilirubin 0.9, Alk phos 110, AST 21, ALT 14, albumin low at 2.7.  Urinalysis was negative.   HOSPITAL COURSE:  Problem 1.   GASTROINTESTINAL BLEED:  For the first couple  of days, he continued to have hematochezia.  On hospital day #3, he  underwent colonoscopy with findings noted above.  On hospital day #4 and #5,  he had brown appearing stools.  We advanced the diet from a clear liquid  diet to a low residue diet.  On hospital day #6, he had recurrent bloody  bowel movements of mild amount.  The following three days, he either did not  have bowel movements or he had brown bowel movements.  By the time of his  discharge on March 24, he had not had any rebleeding for over 72 hours.  The  patient was hemodynamically stable during his hospitalization.   Problem 2.  ANEMIA:  Two units of packed red blood cells were ordered to be  transfused on the day he was admitted, March 16.  Receipts for these  transfusions are in the permanent medical record.  Another two units of  blood were ordered to be transfused on March 18, and review of the ICU  nursing flow sheets show that these were transfused.  However, the receipts  for these two units of blood are not in the permanent medical record.  At no  point, did his hemoglobin drop below 9.3.   Problem 3.  RECTAL STRICTURE:  The patient had prior history of radiation  proctitis on colonoscopy.  When Dr. Olevia Perches repeated the colonoscopy this  admission, she did notice a nonbleeding rectal stricture.  The patient has  not complained of constipation related to this.   Problem 4.  HISTORY OF ATRIAL FIBRILLATION AND FLUTTER, STATUS POST  ABLATION:  Cardiovascular wise, the patient remained in normal sinus rhythm  with heart rates in the 50s-70s range.   CONDITION ON DISCHARGE:  Stable and improved.   FOLLOW UP:  He was to call Dr. Sharlett Iles for a follow-up appointment as  needed if he had any GI problems and specifically he was to call if he had  any recurrent bleeding.  The patient preferred not to have a standing  appointment made with Dr. Sharlett Iles.  DISCHARGE  MEDICATIONS:  1. Metamucil 1 tsp daily as needed for constipation.  2. Iron sulfate one p.o. q.d.  3. Aspirin 81 mg, restart this lower  dose on April 7.  4. Synthroid 0.05 mcg p.o. q.d.  5. Protonix 40 mg p.o. q.d.  6.     Timolol eye drops one drop to each eye at bedtime.  7. Pravachol 10 mg p.o. q.d.  8. Flomax 0.4 mg p.o. q.d.     Gerre Couch, P.A. [facility]                    Malcolm T. Fuller Plan, M.D. [facility]    SG/MEDQ  D:  06/30/2002  T:  07/03/2002  Job:  [id number]   cc:   Iven Finn, M.D.  [place], [employer]

## 2010-08-15 NOTE — Assessment & Plan Note (Signed)
[facility]                              CARDIOLOGY OFFICE NOTE   MAJID, BOEHL                      MRN:          [id number]  DATE:11/02/2005                            DOB:          01-18-1923    PRIMARY CARE PHYSICIAN:  Lorelee Market, M.D.   REASON FOR EVALUATION:  Evaluate patient with atrial fibrillation/flutter  and syncope status post pacemaker placement.   HISTORY OF PRESENT ILLNESS:  The patient returns for followup after having a  pacemaker placed in May of this year.  He has done quite well.  He has had  no further syncopal episodes.  He has had a couple of dizzy spells.  He  reports two of these.  They last for about 15 minutes.  He goes to rest and  they go away.  He does not feel his heart racing or skipping at those  moments.  His pacemaker interrogation demonstrates some very short-lived  episodes for atrial fibrillation that would not be lasting long enough to  correlate with these symptoms.  He otherwise has been doing quite well.  He  has  been active.  He denies any chest discomfort, neck discomfort, arm  discomfort, activity-induced nausea, vomiting or excessive diaphoresis.  He  has had no PND or orthopnea.   PAST MEDICAL HISTORY:  1.  Atrial flutter status post ablation.  2.  Atrial fibrillation on chronic Coumadin therapy.  3.  Hypertension.  4.  Peptic ulcer disease status post gastric surgery in 1975.  5.  Hernia repair.  6.  Cataract surgery.  7.  Benign prostatic hypertrophy.  8.  Dyslipidemia.  9.  Treated hypothyroidism.   ALLERGIES:  PENICILLIN.   MEDICATIONS:  1.  Synthroid 0.25 mg a day.  2.  Protonix 40 mg a day.  3.  Coumadin.  4.  Caduet 5 one-half tablet daily.  5.  Iron.  6.  Metamucil.  7.  Multivitamin.  8.  Timolol.  9.  Amiodarone 200 mg b.i.d.  10. Actonel.   REVIEW OF SYSTEMS:  As stated in the HPI, otherwise negative for other  review of systems.   PHYSICAL EXAMINATION:   GENERAL:  The patient is in no distress.  VITAL SIGNS:  Blood pressure 130/78, heart rate 70 and regular, weight 176  pounds.  HEENT:  Eyes are unremarkable, pupils equal, round and reactive to light,  fundi not visualized.  NECK:  No jugular venous distention, wave form within normal limits, carotid  upstroke brisk and symmetrical, no bruits or thyromegaly.  LYMPHATICS:  No cervical, axillary, inguinal adenopathy.  LUNGS:  Clear to auscultation bilaterally.  BACK: No costovertebral angle tenderness.  CHEST:  Well-healed left upper pacemaker pocket.  HEART:  PMI displaced which is sustained, S1 and S2 within normal limits, no  S3, no S4, no murmurs.  ABDOMEN:  Flat.  Positive bowel sounds.  Normal frequency, pitch, no bruits,  no rebound, guarding.  No midline pulsatile masses or hepatomegaly or  splenomegaly.  SKIN:  No rash, no jaundice.  EXTREMITIES:  There are 2+ pulses on the right, no  edema, no cyanosis, no  clubbing.  NEUROLOGIC:  Oriented to person, place and time.  Cranial nerves II through  XII are grossly intact.  Motor is grossly intact.   Status post pacemaker placement.  The patient has a dual chamber end-rhythm  pacemaker.  The atrial amplitude is 1.9 with an impedence of 464 and  threshold of 1 at 0.4 milliseconds.  The right ventricular amplitude is 6.4  with impedence of 616 at a threshold of 0.5 at 0.4 milliseconds.  The output  was adjusted to 2/2.5 per protocol.  He does have a DDDR/AAAR mode.  AV  delay is 300 milliseconds.  He was noted to have very infrequent atrial  fibrillation.  He is atrial pacing over 99% of the time with ventricular  sensing.   ASSESSMENT AND PLAN:  Tachybrady syndrome with syncope.  The patient has had  no further syncope.  He has some mild dizzy spells with the last one being  about a month ago.  These do not correlate with anything that we can pick up  on the pacemaker evaluation.  He has normal pacemaker function.  He is   maintaining therapy with Coumadin in our Coumadin clinic.  At this point no  further cardiographic testing is suggested.  He will see Dr. Lovena Le again in  May for his next followup of fib/flutter.  I will also ask Dr. Lovena Le to  follow the pacemaker at that appointment.                               Minus Breeding, MD, [facility]    JH/MedQ  DD:  11/02/2005  DT:  11/02/2005  Job #:  [id number]   cc:   Lorelee Market

## 2010-08-15 NOTE — Procedures (Signed)
[place]  Patient:    [name] [name], [name] [name]                      MRN: [id number] Proc. Date: 06/26/99 Adm. Date:  [id number] Attending:  Cristopher Peru CC:         Dr. Bernita Buffy, [place], [facility]                           Procedure Report  PROCEDURE PERFORMED:  Electrophysiologic study and RF catheter ablation of atrial flutter.  INDICATION:  Recurrent atrial flutter.  HISTORY OF PRESENT ILLNESS: The patient is a 77 year old man who recently presented to the hospital with increasing dyspnea and was subsequently found to be in atrial flutter.  He was subsequently discharged home with Lovenox and was returned today for electrophysiologic study and RF catheter ablation.  It should be noted that the patient desired not to take life time Coumadin therapy and chronic rate control for his atrial flutter which was typically 2:1.  II:  PROCEDURE:  After informed consent was obtained, the patient was taken to he diagnostic EP lab in a fasting state.  After the usual preparation and raping, intravenous Fentanyl and Midazolam were given for conscious sedation.  A 6 French Hexapolar catheter was inserted percutaneously into the right jugular vein and advanced through coronary sinus. A 5 French quadripolar catheter was inserted percutaneously into the right femoral vein and advanced to the His bundle region. A 7 French 20 halo catheter was inserted percutaneously in the right femoral vein and advanced to the right atrium.  A 7 French quadripolar ablation catheter was  inserted percutaneously in the right femoral vein and advanced to the right atrium. After measurement of the basic intervals, mapping was carried out which demonstrated typical counter clockwise atrial flutter.  The flutter cycle length was 230 msec and it appeared dependent on the tricuspid annulus for  re-entry. he ablation catheter was subsequently maneuvered into the usual atrial flutter isthmus and a total of two RF energy applications were delivered which resultant in termination of the atrial flutter and initial bidirectional block in the usual atrial flutter isthmus.  The patient was observed for approximately ten minutes and additional pacing was carried out from the coronary sinus which demonstrated the return of the atrial flutter isthmus conduction.  A total of four additional RF  energy applications were then delivered during pacing from the coronary sinus and this in fact demonstrated the creation of bidirectional block in the atrial flutter isthmus.  The patient was observed for approximately 35 minutes and continued to have block in the atrial flutter isthmus with coronary sinus pacing.  At this point, the catheters were removed.  Hemostasis was observed and the patient was  returned to the room in good condition.  III: COMPLICATIONS:  None.  IV:  RESULTS: A. BASELINE 12 LEAD EKG:  The baseline 12 lead EKG demonstrates atrial flutter    with variable AV conduction. B. BASELINE INTERVALS:  The atrial flutter cycle length was 230 msec.  The    HV interval was 49 msec.  The QRS duration was 80 msec.  The QT interval    was 330 msec.  Following creation of block and restoration of sinus rhythm  sinus node cycle length was 1060 msec with an AH interval of 83 msec and an    HV interval of 46 msec. C. PROGRAMMED ATRIAL STIMULATION:  Programmed atrial stimulation was carried out    from the coronary sinus at a basic dry cycle length of 500 msec.   The S1 and    S2 interval was step-wise decreased down to 290 msec where there is a marked    AH jump.  Following this at S1-S2 interval of 500-280.  The ERP of the AV    node was observed. D. RAPID ATRIAL PACING:  Rapid atrial pacing was carried out from the coronary    sinus at a pacing cycle length of 490 msec and  step-wise decreased down to    390 msec where AV Wenckebach was observed.  During rapid atrial pacing the    PR interval remained less than the R interval. E. ARRHYTHMIAS OBSERVED:    1. Atrial flutter initiation present at the time of EP study. Duration       sustained.  Termination RF energy application. F. MAPPING:  Mapping of the patients atrial flutter demonstrated typical isthmus    dependent atrial flutter which rotated counter clockwise around the    tricuspid valve isthmus when reviewed in the LAO projection. G. RF ENERGY APPLICATION:  A total of six RF energy applications were delivered    to the usual atrial flutter isthmus.  During the second RF energy application    the patients atrial flutter terminated and normal sinus rhythm was restored.    Initially there was bidirectional block in the atrial flutter isthmus. However,    isthmus conduction recurred and an additional 4 RF energy applications were    delivered which resulted in the creation of bidirectional block in the atrial    flutter isthmus.  V:  CONCLUSION:  This study demonstrates evidence of dual AV physiology but no AVNRT.  In addition, it demonstrates successful RF catheter ablation of typical  counter clockwise atrial flutter with a total of six RF energy applications delivered to the atrial flutter isthmus which resulted in termination of atrial  flutter, restoration of sinus rhythm, and the creation of bidirectional block in the atrial flutter isthmus.  There were no immediate procedure complications. DD:  06/26/99 TD:  06/27/99 Job: [id number]

## 2010-08-15 NOTE — Op Note (Signed)
NAMEJAQUA, MEAR               ACCOUNT NO.:  [id number]   MEDICAL RECORD NO.:  [id number]          PATIENT TYPE:  AMB   LOCATION:  [place]                          FACILITY:  [facility]   PHYSICIAN:  Isabel Caprice. Hassell Done, MD  DATE OF BIRTH:  10-May-1922   DATE OF PROCEDURE:  07/29/2005  DATE OF DISCHARGE:                                 OPERATIVE REPORT   CHIEF COMPLAINT:  Nonhealing left inguinal wound.   HISTORY:  Mr. Stogsdill is an 77 year old gentleman who has had chronic  infection of his left inguinal region from some mesh placed several years  ago.  This was not infected and had healed and then the patient got seed  implants and external beam radiation for prostate cancer and then this area  got infected and festered ever since.  He is brought in today for  debridement.   SURGEON:  Isabel Caprice. Hassell Done, MD   ANESTHESIA:  Local with MAC.   DESCRIPTION OF PROCEDURE:  Mr. Patton Koss was clipped, prepped with  Betadine and draped sterilely, and infiltrated with lidocaine.  I then  excised about a 3-cm ellipse of tissue and carried this down deep, cutting  out some woody-hard granulation tissue and anything that looked infected.  I  did not encounter any overt mesh.  Electrocautery was used to control  bleeding and then was packed with half-inch Iodoform gauze.   The patient will go home with Percocet for pain and arrangements are being  made for [facility] to come out and apply a wound V.A.C. to this  wound.      Isabel Caprice Hassell Done, MD  Electronically Signed     MBM/MEDQ  D:  07/29/2005  T:  07/30/2005  Job:  [id number]

## 2010-08-15 NOTE — Discharge Summary (Signed)
[place]  Patient:    [name] [name], [name] [name]                      MRN: [id number] Adm. Date:  [id number] Disc. Date: [id number] Attending:  Lily Lovings Dictator:   Erlene Quan, P.A.C. [facility] CC:         Leone Brand. Lovena Le, M.D. [facility]                  Referring Physician Discharge [place]  DISCHARGE DIAGNOSES: 1. Atrial flutter with increased ventricular response at admission. 2. Treated hypothyroidism. 3. Hyperlipidemia. 4. Benign prostatic hypertrophy.  HISTORY OF PRESENT ILLNESS:  Patient is a 77 year old male followed by Dr. Bernita Buffy and seen by Dr. Chancy Milroy in [place].  There is no prior history of MI.  Three weeks ago, prior to cataract surgery, he was found to be in rapid atrial flutter, although he was fairly asymptomatic.  He has been seeing Dr. Chancy Milroy off and on. is medications have been adjusted, and he has a tendency towards bradytachycardia.  Apparently, procainamide had been recently started as an outpatient.  He came to our emergency room June 21, 1999, essentially for a second opinion.  He was seen by Dr. Stanford Breed on admission.  The patient says he had an exercise Cardiolite and an echocardiogram in [place], and these were within normal limits.  PAST MEDICAL HISTORY:  Remarkable for peptic ulcer disease, he had surgery in 1975. He has had prior hernia repair, cataract surgery, and has BPH and hyperlipidemia and treated hypothyroidism.  ALLERGIES:  PENICILLIN.  HOSPITAL COURSE:  He was admitted to telemetry and started on heparin and Lanoxin was added to help with rate control.  Beta blocker was added on March 25, and then later discontinued as he did have some slow rates.  He was seen in consult by Dr. Caryl Comes June 23, 1999, and is to have an outpatient EP study and hopefully an ablation later this week.  DISCHARGE MEDICATIONS: 1. Hytrin 5 mg a day. 2. Covera 180 mg a day. 3. Synthroid 0.05 mg a day. 4. Pravachol  10 mg a day. 5. Lanoxin 0.125 mg a day. 6. Lovenox 80 mg subcu q.12h.  LABORATORY:  TSH is 1.55.  White count 6.2, hemoglobin 13.6, hematocrit 41.2, platelet count 234.  INR on admission 1.2.  Sodium 135, potassium 3.9, BUN 16, creatinine 1.4. CK-MB and troponin are negative.  Chest x-ray shows mild cardiomyopathy, no active disease.  At discharge he is in atrial flutter with ventricular rate in the 60s.  DISPOSITION:  Patient is discharged in stable condition and will be readmitted later this week for elective EP study and ablation. DD:  06/23/99 TD:  06/23/99 Job: [id number]

## 2010-08-15 NOTE — Assessment & Plan Note (Signed)
[facility]                         ELECTROPHYSIOLOGY OFFICE NOTE   REVON, SUTTER                      MRN:          [id number]  DATE:05/10/2006                            DOB:          11-20-1922    Mr. Diab returns today for followup.  He is a very pleasant elderly man  with sinus bradycardia who underwent permanent pacemaker insertion back  in 2007.  The patient prior to that had a history of atrial flutter and  underwent ablation.  The patient has no specific complaints today except  that he does occasionally have spells where he gets real dizzy and  lightheaded but without any clear-cut syncope.  These spells are  typically lasting only a few minutes.  He denies palpitations.  He  denies chest pain or shortness of breath.   EXAMINATION:  GENERAL:  He is a pleasant, elderly man in no distress.  VITAL SIGNS:  Blood pressure 118/78, the pulse 67 and regular, the  respirations were 18, the weight was 146 pounds.  NECK:  Revealed no jugular venous distention.  LUNGS:  Clear bilaterally to auscultation.  CARDIOVASCULAR:  Revealed a regular rate and rhythm with normal S1 and  S2.  EXTREMITIES:  Demonstrated no cyanosis, clubbing or edema.   Medications include Synthroid, Protonix, Coumadin, Caduet, iron  supplementation, Metamucil, amiodarone 200 twice daily, and Ensure Plus.   EKG demonstrates atrial pacing with appropriate ventricular sensing.   Interrogation of his pacemaker demonstrates a Medtronic EnRhythm with P  waves of 1 millivolt and R waves of between 5 and 6 millivolts, the  threshold 1 volt at 0.5 in the atrium and 0.5 at 0.5 in the ventricle.  The pacing lead impedance was 528 in the atrium, 608 in the ventricle.  Review of his histograms demonstrated no episodes of atrial fibrillation  since July 2007.  His activity counters were stable.  His histograms  were fairly flat from a perspective of atrial rate but he is  asymptomatic with this.   IMPRESSION:  1. Symptomatic bradycardia.  2. History of paroxysmal atrial fibrillation.  3. Chronic Coumadin therapy.  4. Status post pacemaker.   DISCUSSION:  Overall, Mr. Kuper is stable and his pacemaker is working  normally.  We will have him back to see [place] in 1 year.  At that time, we  will consider decreasing his dose of amiodarone.     Champ Mungo. Lovena Le, MD  Electronically Signed    GWT/MedQ  DD: 05/10/2006  DT: 05/10/2006  Job #: [id number]   cc:   Talmage Nap, MD, [facility]

## 2010-08-15 NOTE — Consult Note (Signed)
[place]. [facility]  Patient:    [name] [name], [name] [name]                      MRN: [id number] Proc. Date: 06/23/99 Adm. Date:  [id number] Disc. Date: [id number] Attending:  Lily Lovings [id number] CC:         Electrophysiology Laboratory             Dr. Brunetta Genera             Dr. Charolotte Capuchin, [place]                          Consultation Report  REASON FOR CONSULTATION: Thank you very much for asking me to see [name] in electrophysiology consultation for recurrent atrial flutter.  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  This initially presented when he went for preoperative evaluation for cataract surgery and he was found to be tachycardic.  He was referred to Dr. Brunetta Genera, who ultimately had him seen by Dr. Humphrey Rolls.  He underwent an echocardiogram which was apparently normal and was treated with a variety of medications including Coumadin, Lanoxin, Verapamil, and subsequently Procanbid.  Notwithstanding these medications he has continued to have clinical recurrences, and with one of these recurrences presented himself to [facility] on Saturday, and he converted back into sinus rhythm yesterday.  He also underwent a stress test, the results of which are unavailable.  The patient denies exercise intolerance but with exercise has a chest discomfort.  His thromboembolic risk factors are his age, negative for hypertension (although he  does Hytrin), diabetes, prior TIA, or heart failure.  His cardiac risk factors re negative for smoking and he does have hypercholesterolemia, in addition to the above.  PAST MEDICAL HISTORY: Hypercholesterolemia and he also has treated hypothyroidism and  BPH.  PAST SURGICAL HISTORY:  Notable for ulcer surgery.  He has had hernia surgery, cataract surgery.  ADMISSION MEDICATIONS:  1. Procan 1 g b.i.d.  2. Synthroid.  3. Pravachol.  4. Hytrin.  5. Covera.  ALLERGIES: PENICILLIN.  SOCIAL HISTORY:  He is a widower.  He does not smoke or drink.  FAMILY HISTORY:  His brother had AVR and his other brother had alcohol disease.  REVIEW OF SYSTEMS: Noncontributory.  PHYSICAL EXAMINATION:  GENERAL:  He is an elderly Caucasian male in no acute distress.  VITAL SIGNS:  His blood pressure is 106/74.  His pulse this morning is 66.  NECK:  His neck veins are flat.  His carotids are brisk and full bilaterally without bruits.  BACK:  His back is without kyphosis or scoliosis.  LUNGS:  His lungs are clear.  HEART:  His heart sounds are irregular and an S4 is present.  There are no murmurs or gallops.  ABDOMEN:  The abdomen was soft with active bowel sounds.  EXTREMITIES:  Femoral pulses were 2+.  Distal pulses were intact.  There was no  clubbing, cyanosis, or edema.  NEUROLOGIC:  Grossly normal.  LABORATORY DATA:  Electrocardiogram obtained in atrial flutter demonstrates typical appearing atrial flutter with an atrial cycle length of 230 msec.  His ventricular rate is 2:1 at 250 to 270 msec.  There is no  evidence of a prior myocardial infarction.  Following conversion to sinus rhythm his heart rate was 56.  IMPRESSION:  1. Paroxysmal atrial flutter.  2. Apparently normal echocardiogram and Cardiolite.  3. History of syncope 2-1/2 years ago.  4. Orthostatic light headedness.  5. Thromboembolic risk factors notable for age (equivocal for at the high end),     negative for hypertension, negative for diabetes, negative for transient     ischemic attack, and cardiac risk factors are positive for cholesterol, plus     see above.  [name] [name] has recurrent atrial flutter which is quite symptomatic.  Treatment options were  reviewed with the patient today including anticoagulation plus the  equivocal nature in a male patient who is age 32, the proarrhythmic issues of procainamide as well as the potential benefits and risks of RF catheter ablation. We discussed the potential one in 1000 risk of death, one in 250 risk of cardiac perforation, one in 100 risk of iatrogenic heart block requiring pacemaker implantation, as well as the possibility of vascular injury.  He understands these risks and notwithstanding would like to proceed with RF catheter ablation.  Based on the above therefore we will:  1. Review the schedule and find a time he can electively submitted for RF     catheter ablation.  2. Will maintain him on anticoagulation, either Coumadin or Lovenox, in the     interim.  3. Use A-V nodal blocking agents.  We will need to obtain the results of his stress test and his ultrasound. DD:  06/23/99 TD:  06/23/99 Job: [id number]

## 2010-08-15 NOTE — H&P (Signed)
NAME:  [name] [name], [name] [name] NO.:  [id number]   MEDICAL RECORD NO.:  [id number]                   PATIENT TYPE:  INP   LOCATION:  [id number]                                 FACILITY:  [facility]   PHYSICIAN:  Delfin Edis, M.D. [facility]               DATE OF BIRTH:  1923/02/23   DATE OF ADMISSION:  06/13/2002  DATE OF DISCHARGE:                                HISTORY & PHYSICAL   CHIEF COMPLAINT:  Bleeding.   HISTORY:  The patient is a very nice 77 year old white male, primary patient  of Dr. Doreene Nest in [place], known to Dr. Verl Blalock and Dr. Cristopher Peru.  He is admitted at this time with large volume hematochezia which  started about 3 o'clock this morning.  He had abrupt onset with four bloody  bowel movements associated with weakness and a probable syncopal episode at  which time he hit his head on a wooden chair.  He said this occurred on his  way to the phone to call his daughter.  He denies any associated abdominal  pain or cramping, no nausea or vomiting, no chest pain or anginal symptoms,  no shortness of breath.  He was transported to the ER.  Hemoglobin with  fingerstick was approximately 9, blood pressure was 89 systolic on arrival,  he is hydrated currently and labs have returned with a hemoglobin of 10,  blood pressure is up to 127/70.  He has had no stools in the emergency room  and is alert and oriented.  He does have a history of a GI bleed remotely in  1975 with an ulcer and had a low-grade GI bleed which did not require  hospitalization in June 2003.  He subsequently underwent upper endoscopy and  colonoscopy by Dr. Verl Blalock, was placed on Protonix and was told that  he also had diverticulosis.  He is currently on aspirin 325 mg per day for  prophylaxis, no NSAIDs.  He says he has been feeling well recently, had  noted no recent melena or hematochezia, appetite has been fine, weight has  been stable.   CURRENT MEDICATIONS:  Synthroid 0.05  once daily, Timolol eye drops one drop  each eye at bedtime, Pravachol 10 mg once daily, Flomax 0.4 mg once daily  and Protonix 40 once daily, aspirin 325 once daily.   ALLERGIES:  PENICILLIN which causes a rash.   PAST MEDICAL HISTORY:  1. Diverticulosis, GERD, remote history of peptic ulcer disease with bleed     in 1975.  2. History of prostate cancer with radiation seed implants in 2002 and he     has had several left inguinal hernia repairs, one done remotely in 1994,     one done approximately a year ago and then had an abscess in the left     inguinal hernia in February 2004 which was drained by Dr. Hassell Done.  3. He also  has a history of atrial fibrillation/flutter and had an ablation     procedure in April 2003.  4. History of cataracts and glaucoma.   SOCIAL HISTORY:  The patient is widowed, self-sufficient, no tobacco, no  ETOH, and lives alone.  His daughter accompanies him today.   FAMILY HISTORY:  Negative for GI disease.  His father is deceased with bone  cancer many years ago.   REVIEW OF SYSTEMS:  Reviewed and negative in its entirety other than listed  above.  He specifically denies any headache since his fall.  He does have an  abrasion on his right forehead, denies any other extremity or joint injury  with his fall at home this morning.   PHYSICAL EXAMINATION:  GENERAL:  Well-developed elderly male in no acute  distress, very pleasant, alert and oriented x3.  VITAL SIGNS:  Blood pressure 127/70, pulse in the 80s, respirations 16.  He  is afebrile.  HEENT:  Nontraumatic, normocephalic.  EOMI.  PERLA.  Sclerae are anicteric.  Neck is supple and without nodes.  There is no JVD.  He does have an  abrasion approximately 6-7 cm on the right forehead.  LUNGS:  Clear to A&P.  CARDIOVASCULAR:  Regular rate and rhythm with S1 and S2.  No murmur, rub, or  gallop.  ABDOMEN:  Soft, has healed incisional scars.  Bowel sounds are active.  He  is nontender.  There is no  palpable mass or hepatosplenomegaly.  RECTAL:  Somewhat stenosed rectum, unable to pass little finger, there is  frank blood on the glove.  EXTREMITIES:  Without cyanosis, clubbing, or edema.  NEUROLOGICAL:  Grossly nonfocal.   LABORATORIES:  Remainder of labs are pending at the time of admission.  Admission hemoglobin 10, hematocrit of 30, coagulopathies within normal  limits.   IMPRESSION:  1. The patient is a 77 year old white male with acute lower gastrointestinal     bleed consistent with diverticular hemorrhage, rule out bleed secondary     to radiation proctitis though feel less likely, rule out complication of     left inguinal hernia abscess with fistulization again most likely.  2. Syncope secondary to #1 with hypovolemia.  3. Anemia acute secondary to #1.  4. History of prostate cancer status post radiation seed implants.  5. History of left inguinal hernia repair with abscess February 2004 status     post drainage.  6. Remote history of peptic ulcer disease 1975 with surgical repair.  7. History of atrial fibrillation/flutter status post ablation 2003.  8. Cataracts and glaucoma.  9. Hypothyroidism.   PLAN:  The patient is admitted to the service of Dr. Delfin Edis for IV  fluid hydration, serial H&H's, will transfuse 2 units today and then as  needed, will be placed at bowel rest, we will hold his aspirin, review his  office records and hopefully will not require repeat colonoscopy this  admission but course will depend on persistence of bleeding.     Nicoletta Ba, P.A.-C. [facility]                Delfin Edis, M.D. [facility]    AE/MEDQ  D:  06/13/2002  T:  06/13/2002  Job:  [id number]   cc:   Dr. Hassell Done   Dr. Meredith Staggers, [place]

## 2010-08-15 NOTE — Op Note (Signed)
[name], [name]               ACCOUNT NO.:  [id number]   MEDICAL RECORD NO.:  [id number]          PATIENT TYPE:  AMB   LOCATION:  [place]                          FACILITY:  [facility]   PHYSICIAN:  Isabel Caprice. Hassell Done, M.D.DATE OF BIRTH:  Nov 06, 1922   DATE OF PROCEDURE:  DATE OF DISCHARGE:                                 OPERATIVE REPORT   PREOPERATIVE DIAGNOSIS:  Infected sinus tract, left inguinal region.   POSTOPERATIVE DIAGNOSIS:  Infected sinus tract, left inguinal region.   PROCEDURE:  Exploration and debridement of left inguinal sinus tract, wound  cultures, and wound packing.   SURGEON:  Isabel Caprice. Hassell Done, M.D.   ANESTHESIA:  General.   DESCRIPTION OF PROCEDURE:  [name] [name] is a 77 year old gentleman who  takes Synthroid, Protonix, Caduet, Warfarin, timolol, for multiple chronic  medical problems but basically is a very active man.  He has a PENICILLIN  allergy.  He underwent hernia repair in 1994 and in 2001.  He presented  about 3 years out with drainage of infection from his left inguinal region,  with a history of prior radiation and seed implant for prostate cancer in  2002.  He has been on Coumadin for atrial fibrillation since the early 2000s  as well.  He also has a history of diverticulosis, diverticulitis, and  radiation proctitis.  He has had this area drained once before, in 2004, but  came back when this thing festered, and we elected to go ahead and re-  explore it.   The area was prepped with Betadine and draped sterilely.  First, I cut out  an ellipse of tissue along the incision line, in the obviously draining  sinus.  I inserted a probe and began cutting this down gradually, excising a  lot of fat necrosis.  I did not cut across any frank mesh, and I went down  and found that a tract was draining some brackish-looking fluid that I  cultured for aerobes and anaerobes, and then I followed that tract down into  a linear area.  It seemed to go medial, and I  pretty much found its lateral  extent.  I debrided that with sharp dissection, both with a knife and with  scissors, and to the best of my knowledge, saw no further tracts where it  was going.  I irrigated with saline.  I tried to debride as gently but as  thoroughly as I could, without trying to get out and jeopardize the obvious  important vascular structures in this region.  I did not come across  anything that looked like colon, but I was  certainly looking for it.  In the end, I did not have any further flashes of  purulent material, and I elected to pack this with 1/4-inch iodoform gauze.  He is going to be kept in the overnight observation unit, and arrangements  will be made for [place] to go out and re-pack this every  day.       MBM/MEDQ  D:  01/17/2004  T:  01/17/2004  Job:  [id number]   cc:  [place]  [address]  [place]  [place] [address]  Fax: [phone]

## 2010-08-15 NOTE — Op Note (Signed)
   NAME:  [name] [name], [name] [name]                         ACCOUNT NO.:  [id number]   MEDICAL RECORD NO.:  [id number]                   PATIENT TYPE:  AMB   LOCATION:  [place]                                  FACILITY:  [facility]   PHYSICIAN:  Isabel Caprice. Hassell Done, M.D.             DATE OF BIRTH:  April 25, 1922   DATE OF PROCEDURE:  04/18/2002  DATE OF DISCHARGE:                                 OPERATIVE REPORT   PREOPERATIVE DIAGNOSIS:  Recurrent left inguinal hernia.   POSTOPERATIVE DIAGNOSIS:  Left inguinal abscess.   PROCEDURE:  1. Drainage of abscess.  2. Removal of mesh and left inguinal hernia repair.   SURGEON:  Isabel Caprice. Hassell Done, M.D.   ANESTHESIA:  General.   DESCRIPTION OF PROCEDURE:  [name] was taken to room 3 at [facility]  and placed asleep.  The inguinal region was prepped with Betadine and draped  sterilely.  We recreated his old oblique incision, went down and dissected  down beneath the fascia and once beneath the fascia, encountered pus.  Culture and Gram-stain were obtained of this.  Gram-stain was delayed but  ultimately revealed a scant number of Gram-negative rods.  This purulence  was non-foul smelling.  I went ahead and opened this and found a  multiloculated collection, mainly laterally.  This, however, did extend down  into where he had had previous mesh placed.  I removed the mesh that seemed  to be exposed to this purulent cavity.  The wound was irrigated with saline.  I then placed some quarter-inch iodophor packing down in this and then  closed the medial aspect with 4-0 Vicryl and with nylon sutures.  The  patient had been given Cipro intravenously because he does have an allergy  to penicillin.  He did get Ancef preoperatively but will be kept on Cipro  pending cultures and sensitivities.  The patient will be admitted for  overnight observation.                                               Isabel Caprice Hassell Done, M.D.    MBM/MEDQ  D:  04/18/2002  T:   04/18/2002  Job:  [id number]   cc:   Dr. Jennings Books   Dr. Nicki Reaper [facility]

## 2010-08-15 NOTE — Op Note (Signed)
[place]. [facility]  Patient:    [name] [name], [name] [name]                      MRN: [id number] Proc. Date: 12/02/99 Adm. Date:  [id number] Attending:  Joya San CC:         Dr. Lorelee Market                           Operative Report  CCS# [phone]  PREOPERATIVE DIAGNOSIS:  Left inguinal hernia.  POSTOPERATIVE DIAGNOSIS:  Left indirect inguinal hernia.  OPERATION PERFORMED:  SURGEON:  Matthew B. Hassell Done, M.D.  ASSISTANT:  ANESTHESIA:  MAC.  DESCRIPTION OF PROCEDURE:  Mr. Gillin was taken to operating room 4 and given intravenous sedation.  The left inguinal region was clipped and then prepped with Betadine and draped sterilely.  I did a regional block with 0.5% Marcaine and lidocaine mixture.  An oblique incision was made and carried down to the external oblique.  These were incised along the fibers and elevated.  The ilioinguinal nerve branch was preserved and unencumbered.  The patient had a rather large hernia that appeared to be a prominent indirect inguinal hernia with a sac coming about two inches down along with the cord structures.  I easily recognized the sac and opened it, put my finger in and could feel the sigmoid colon and reduced that.  I freed the sliding component of this and then when I had the neck, which was about an inch in diameter, isolated, I oversewed this with a horizontal running 2-0 silk and then twisted it and then tied this down and then removed the excess sac and allowed this to return up into the abdomen.  This seemed to reduce things completely.  No other areas were palpable.  I did not see mesh in the inguinal canal from his prior laparoscopic hernia repair.  It was unclear whether this was done transabdominally or preperitoneally, but I did not see any mesh.  With the hernia sac ligated and retracted.  I then cut a piece of mesh to fit the floor and sutured this to the inguinal ligament and above to the  internal oblique, then I sutured it to itself with three horizontal mattress sutures of 2-0 Prolene.  I calibrated the orifice to allow for an adequate amount of room for the cord structures.  The external oblique was then closed with running 2-0 Vicryl.  4-0 Vicryl was used in the subcutaneous tissue.  The incision was closed with 4 and 5-0 Vicryl subcutaneously and subcuticularly with benzoin and Steri-Strips on the skin.  The patient seemed to tolerate the procedure well and was taken to the recovery room in satisfactory condition. DD:  12/02/99 TD:  12/03/99 Job: [id number]

## 2010-08-15 NOTE — H&P (Signed)
[name], [name] NO.:  [id number]   MEDICAL RECORD NO.:  [id number]          PATIENT TYPE:  INP   LOCATION:  6531                         FACILITY:  [facility]   PHYSICIAN:  Jacqulyn Ducking, M.D. LHCDATE OF BIRTH:  1922-04-04   DATE OF ADMISSION:  08/19/2005  DATE OF DISCHARGE:                                HISTORY & PHYSICAL   PRIMARY CARDIOLOGIST:  Dr. Cristopher Peru   REASON FOR ADMISSION:  [name] [name] is an 77 year old male, with history of  recurrent atrial flutter status post two previous radiofrequency catheter  ablation procedures by Dr. Cristopher Peru, who now presents to the emergency  room status post a syncopal episode.  Admission electrocardiogram notable  for paroxysmal atrial fibrillation with rapid ventricular response.   The patient was in his usual state of health since last seen in the office  by Dr. Cristopher Peru in February of this year.  He has occasional  palpitations but has not been experiencing any recent presyncope, nor any  exertional dyspnea or chest discomfort.  Earlier this morning the patient  was attended to by a [place] and had removal of a wound vac  which had been put in place since he underwent a redo repair of a left  inguinal hernia on May2, 2007 by Dr. Johnathan Hausen.  While he was standing  at the kitchen sink about to pour himself a drink, he suddenly felt woozy  but had no other prodromal symptoms of chest pain, tachy palpitations,  dyspnea, nausea or diaphoresis. The last thing he remembers was standing at  the sink and then regained consciousness while lying on the floor.  He  thinks he was only out for a few seconds.  The episode was unwitnessed but  he was attended to shortly afterwards by his close friend and then taken to  the emergency room.   The patient did suffer some injury to the right eye but not to the head.  A  CT scan of the head and skull was negative and with no evidence of fracture.   Initial  electrocardiogram showed both normal sinus rhythm with subsequent  development of paroxysmal atrial fibrillation.  This was then verified by a  second EKG with rates in the 130 range.  The patient was treated with a 20  mg bolus of IV diltiazem and then placed on a 5 mg/hour infusion.   Shortly following our evaluation, the patient was noted to have  spontaneously converted to normal sinus rhythm which was verified by  electrocardiogram.   ALLERGIES:  PENICILLIN.   HOME MEDICATIONS:  1.  Synthroid 0.05 per mg daily.  2.  Protonix 40 mg daily.  3.  Iron sulfate 325 degrees daily.  4.  Coumadin 2 mg daily except 4 mg on Tuesday and Saturday.  5.  Caduet 1/2 tablet daily.  6.  Timoptic eye solution 1 drop OU at h.s.  7.  Doxycycline 100 mg daily.   PAST MEDICAL HISTORY:  1.  Recurrent atrial flutter.      1.  Status post RF ablation 2001 and 2003,  by Dr. Cristopher Peru.      2.  Inducible atrial fibrillation during ablation of procedure in 2003          for which the patient was placed on amiodarone by Dr. Lovena Le.  2.  History of syncope presumably secondary to #1.  3.  Chronic Coumadin.  4.  Normal left ventricular function.      1.  Echocardiogram possibly 2003.  5.  History of mild mitral regurgitation.  6.  Hyperlipidemia.  7.  Prostate cancer, 2003 treated with radiation seed implants.  8.  History of remote bleeding ulcer.  9.  Status post redo left inguinal hernia repair May2, 2007 secondary to      nonhealing chronic infection.   SOCIAL HISTORY:  The patient lives alone in [place].  He has two  children.  He is a retired of Environmental health practitioner.  He has never smoked tobacco  and denies alcohol use.  He reports of being quite active and does not drink  caffeinated beverages.   FAMILY HISTORY:  Both parents deceased, neither of whom have known heart  disease.  Brother age 68, history of prosthetic valve.   REVIEW OF SYSTEMS:  As noted per HPI, no recent fevers, chills,  productive  cough.  No overt bleeding.  Remaining systems negative.   PHYSICAL EXAMINATION:  VITAL SIGNS:  Blood pressure 140/76 on admission,  orthostatic blood pressures were unrevealing. Pulse initially 150, currently  in the 60s.  The patient is afebrile.  SAO2 93% on room air.  GENERAL:  77 year old male, sitting upright, no apparent distress.  HEENT:  Semicircular right suborbital ecchymosis; no evident head trauma.  LUNGS:  Clear to auscultation all fields.  NECK:  Palpable carotid pulses without bruits; no JVD.  LUNGS:  Clear to auscultation all fields.  HEART:  Irregularly irregular (S1, S2)  no murmurs.  ABDOMEN:  Soft, nontender with intact bowel sounds.  EXTREMITIES:  Palpable femoral and distal pulses with no significant pedal  edema.  NEURO: Alert and oriented x3 with no focal deficits.   LABORATORY DATA:  Hemoglobin 15.6, hematocrit 46, WBC 11 thousand, platelets  212,000.  Sodium 136, potassium 3.8, BUN 25, glucose 83, INR 1.9.   IMPRESSION:  1.  Paroxysmal atrial fibrillation with rapid ventricular response.      1.  Treated with IV diltiazem with subsequent conversion to normal sinus          rhythm.  2.  Status post syncope.      1.  Status post a syncopal episode.  3.  History of recurrent atrial flutter.      1.  Status post RF ablation in 2001 and 2003.      2.  History of inducible atrial fibrillation.  4.  Normal ventricular function.  5.  Chronic Coumadin.  6.  Status post redo left inguinal hernia repair.      1.  May2, 2007.   PLAN:  The patient will be admitted for continued close monitoring and  further evaluation.  It is not clear whether the syncopal episode was due to  bradycardia, presumably following conversion from atrial fibrillation, or to  tachycardia.  In either case, elimination of atrial fibrillation would be  desirable.  The patient has previously been treated with amiodarone which was subsequently discontinued for unclear reasons.  We  will be inclined to resume treatments with an antiarrhythmic but will  need to review office records to determine why amiodarone was discontinued  in the  first place.  At the present time, we would defer proceeding with a  permanent pacemaker.  We will, however, request consultation from Dr. Virl Axe for further recommendations.   In the interim, the patient will be continued on current medication regimen,  which includes Coumadin, and we will just add low-dose diltiazem at 30 mg x4  daily for continued rate control.  In addition to checking electrolytes, we  will also cycle cardiac markers and check a TSH level as well.  We will also  schedule a 2D echocardiogram for the morning for reassessment of left  ventricular function.   Of note, we will also notify Dr. Nicholes Stairs office of the patient's  hospitalization.  He was scheduled to see him in the office tomorrow for  post-hospital follow.      Gene Serpe, P.A. [facility]      Jacqulyn Ducking, M.D. [facility]  Electronically Signed    GS/MEDQ  D:  08/19/2005  T:  08/19/2005  Job:  [id number]   cc:   Lorelee Market  Fax: [phone]

## 2010-08-15 NOTE — Discharge Summary (Signed)
[place]. [facility]  Patient:    ESPER, TRUELOCK                      MRN: [id number] Adm. Date:  [id number] Disc. Date: 06/27/99 Attending:  Cristopher Peru Dictator:   Sharyl Nimrod, P.A.C. CC:         Leone Brand. Lovena Le, M.D. [facility]             Dr. Bernita Buffy.                  Referring Physician Discharge [place]:  09-Dec-1922  HISTORY OF PRESENT ILLNESS:  Mr. Thoma was recently admitted with atrial flutter associated with shortness of breath.  He is admitted on this time for TEE and possible ablation.  HOSPITAL COURSE:  TEE was performed on March 29 by Dr. Harrington Challenger.  Did not show any ass or thrombus.  Mild MR, mild TR, trace AI, normal LV and RV function, normal thoracic aorta.  On March 29, Dr. Lovena Le performed EP study and RS ablation of atrial flutter without complications.  He was begun on Coumadin on the afternoon of March 29.  By March 30, catheterization sites were intact and it was felt he could be discharged home.  DISCHARGE DIAGNOSIS:  Atrial flutter, status post ablation.  DISPOSITION:  Discharged home.  MEDICATIONS: 1. Coumadin 5 mg q.d.  Anticipated duration of this drug is six weeks. 2. Lovenox 80 mg subcu q.12h.  He was asked to resume his Hytrin, Synthroid, Pravachol, and Covera.  He was asked not to take his Lanoxin.  ACTIVITY:  Avoid lifting, driving, sexual activity, or heavy exertion for two days.  DIET:  Maintain low salt/fat/cholesterol diet.  FOLLOW-UP:  If he had any difficulty with his catheterization site, he was asked to call immediately.  He will have a PT/INR drawn with Dr. Bernita Buffy next Wednesday or Thursday, and will see Dr. Lovena Le in his office on May 8 at 2:45 p.m. DD:  06/27/99 TD:  06/27/99 Job: [id number]

## 2010-08-15 NOTE — Procedures (Signed)
[place]. [facility]  Patient:    [name] [name], [name] [name] Visit Number: [id number] MRN: [id number]          Service Type: CAT Location: 2000 2036 01 Attending Physician:  Cristopher Peru Dictated by:   Champ Mungo. Lovena Le, M.D. [facility] Proc. Date: 07/06/01 Admit Date:  07/06/2001 Discharge Date: 07/07/2001   CC:         Timmothy Sours, M.D., [facility]   Procedure Report  PROCEDURE PERFORMED: Invasive electrophysiology study with RF catheter ablation of atrial flutter.  I:  INTRODUCTION: The patient is a very pleasant 77 year old man with a history of atrial flutter documented two years ago. He underwent electrophysiology study and catheter ablation at that time without complication. He was in his usual state of health until approximately one month ago when he developed recurrent atrial flutter with associated palpitations. He was referred for additional evaluation. He now presents for TEE followed by catheter ablation.  II:  DESCRIPTION OF PROCEDURE: After informed consent was obtained and the patient was demonstrated to have no evidence of left atrial appendage thrombus, he was taken to the EP lab in a fasting state.  After the usual preparation and draping, intravenous fentanyl and midazolam was given for sedation. A 6 French hexapolar catheter was inserted percutaneously in the right jugular vein and advanced to the coronary sinus. A 7 French 20 polar Halo catheter was inserted percutaneously in the right femoral vein and advanced to the right atrium. A 6 [place] Quadripolar catheter was inserted percutaneously in the right femoral vein and advanced to the His bundle region. After measuring the basic intervals, rapid atrial pacing was carried out from the coronary sinuses and stepwise decreased down to 380 msec where an AV Wenckebach was observed. Next, programed atrial stimulation was carried out a basic dry cycle length to 500 msec. The S1, S2 interval was  stepwise decreased from 440 msec down to 250 msec where atrial refractions were observed. During initial rapid atrial pacing and appropriate atrial stimulation, there was no inducible atrial flutter or SVT. Next, rapid atrial pacing was again carried out at a cycle length of 290 msec and stepwise decreased down to 250 msec where the patient developed typical atrial flutter. It should be noted that prior to initiation of flutter, the patient had evidence of recurrent atrial flutter, isthmus conduction. With the initiation of atrial flutter, the cycle length of the flutter was 240 msec. It was sustained but terminated spontaneously. Additional attempts to re-induce the atrial flutter resulted in the initiation of atrial fibrillation. Ibutilide 1 mg was given and the atrial fibrillation regularized in what appeared to be a left atrial flutter intermittently with atrial fibrillation. Subsequently, 200 joules of synchronized of DC energy was delivered after the patient was more deeply sedated restoring sinus rhythm. Rapid atrial pacing was then carried out from the coronary sinuses and the patients cycle length was 600 msec. Two RF energy applications were initially delivered resulting in isthmus block. Four bonus RF energy applications were subsequently delivered and the patient was observed for approximately 45 minutes, at which time there was no evidence of any atrial flutter isthmus conduction. The catheter was then removed. Hemostasis was assured and the patient was returned to his room in satisfactory condition.  III: COMPLICATIONS: There were no immediate procedure complications.  IV: RESULTS:     a. Baseline ECG: The baseline ECG demonstrates normal sinus rhythm.        Previous ECGs demonstrated atrial flutter with 2:1 AV conduction.  b. Baseline intervals: The sinus node cycle length was 1085 msec,        the P-R interval 176 msec, the QR restoration 114 msec, The A-H         interval 79 msec, and the H-V interval 53 msec.     c. Rapid atrial pacing: Rapid atrial pacing demonstrated an AV Wenckebach        cycle length of 380 msec. Additional ______ from 290 msec down to        250 msec resulted in the initiation of atrial flutter. Additional        rapid atrial pacing results in atrial fibrillation.     d. Programed atrial stimulation: Programed atrial stimulation was carried       out from the coronary sinus and the patients dry cycle length of 500        msec. The S1, S2 interval stepwise decreased down to 250 msec where        the atrial refractoriness was observed.     e. Arrhythmias observed:        1. Atrial flutter initiation: Rapid atrial pacing. Duration sustained.           Termination spontaneous. Cycle length 242 msec.        2. Atrial fibrillation initiation: Rapid atrial pacing. Duration           sustained. Termination DC cardioversion.     f. Mapping: Mapping of the patients atrial flutter demonstrated typical        counterclockwise tricuspid annular reentrant conduction.     g. RF energy application: Three initial RF energy applications were        delivered resulting in the creation of isthmus block. Three bonus        RF energy applications were also delivered and the patient was        observed for 45 minutes without evidence of any residual isthmus        conduction.  V:  CONCLUSION: This study demonstrates successful electrophysiology study and RF catheter ablation of recurrent atrial flutter in a patient with recurrent isthmus conduction. The patient, in fact, had inducible atrial fibrillation of questionable clinical significance. Because of this, he will be started on low-dose amiodarone. Dictated by:   Champ Mungo. Lovena Le, M.D. [place] Attending Physician:  Cristopher Peru DD:  07/06/01 TD:  07/07/01 Job: [id number]

## 2010-08-15 NOTE — Discharge Summary (Signed)
NAMEPHILBERT, BAR               ACCOUNT NO.:  [id number]   MEDICAL RECORD NO.:  [id number]          PATIENT TYPE:  INP   LOCATION:  2022                         FACILITY:  [facility]   PHYSICIAN:  Glori Bickers, M.D. LHCDATE OF BIRTH:  June 17, 1922   DATE OF ADMISSION:  08/19/2005  DATE OF DISCHARGE:  08/22/2005                                 DISCHARGE SUMMARY   PROCEDURES:  Insertion of [employer]onic dual lead permanent pacemaker.   PRIMARY DIAGNOSES:  Paroxysmal atrial fibrillation with rapid ventricular  response and post termination pauses.   SECONDARY DIAGNOSES:  1.  Atrial flutter status post ablation in 2001 and 2003.  2.  Hyperlipidemia  3.  History of left inguinal hernia repair, nonhealing secondary to chronic      infection  4.  Remote history of bleeding ulcers.  5.  Prostate cancer status post radiation seed therapy.  6.  Allergy or intolerance to PENICILLIN.  7.  Anticoagulation with Coumadin  8.  Status post redo left inguinal hernia repair in May 2007  9.  History of syncope.   Time of discharge: 26 minutes.   HOSPITAL COURSE:  Mr. Hedrick is an 77 year old male with known arrhythmia  history.  He is followed by Dr. Lovena Le.  After his redo hernia repair, he  was being followed by home health.  He stood up and then seen in the office.   DICTATION ENDS HERE      Rosaria Ferries, P.A. [facility]      Glori Bickers, M.D. [facility]  Electronically Signed    RB/MEDQ  D:  08/22/2005  T:  08/23/2005  Job:  [id number]

## 2010-08-19 ENCOUNTER — Ambulatory Visit (INDEPENDENT_AMBULATORY_CARE_PROVIDER_SITE_OTHER): Payer: Medicare Other | Admitting: Family Medicine

## 2010-08-19 DIAGNOSIS — E538 Deficiency of other specified B group vitamins: Secondary | ICD-10-CM

## 2010-08-19 MED ORDER — CYANOCOBALAMIN 1000 MCG/ML IJ SOLN
1000.0000 ug | Freq: Once | INTRAMUSCULAR | Status: AC
  Administered 2010-08-19: 1000 ug via INTRAMUSCULAR

## 2010-08-19 NOTE — Progress Notes (Signed)
B12 injection given today during nurse visit. 

## 2010-09-03 ENCOUNTER — Ambulatory Visit (INDEPENDENT_AMBULATORY_CARE_PROVIDER_SITE_OTHER): Payer: Medicare Other | Admitting: Emergency Medicine

## 2010-09-03 DIAGNOSIS — Z7901 Long term (current) use of anticoagulants: Secondary | ICD-10-CM

## 2010-09-03 DIAGNOSIS — I4891 Unspecified atrial fibrillation: Secondary | ICD-10-CM

## 2010-09-03 LAB — POCT INR: INR: 1.7

## 2010-09-08 ENCOUNTER — Other Ambulatory Visit: Payer: Self-pay | Admitting: *Deleted

## 2010-09-09 ENCOUNTER — Other Ambulatory Visit: Payer: Self-pay | Admitting: *Deleted

## 2010-09-09 MED ORDER — PANTOPRAZOLE SODIUM 40 MG PO TBEC: 40.0000 mg | DELAYED_RELEASE_TABLET | Freq: Every day | ORAL | Status: DC

## 2010-09-09 NOTE — Telephone Encounter (Signed)
Opened in error

## 2010-09-15 ENCOUNTER — Other Ambulatory Visit: Payer: Self-pay | Admitting: *Deleted

## 2010-09-15 MED ORDER — PANTOPRAZOLE SODIUM 40 MG PO TBEC: 40.0000 mg | DELAYED_RELEASE_TABLET | Freq: Every day | ORAL | Status: DC

## 2010-09-15 MED ORDER — AMIODARONE HCL 100 MG PO TABS: ORAL_TABLET | ORAL | Status: DC

## 2010-09-15 MED ORDER — LEVOTHYROXINE SODIUM 100 MCG PO TABS: 100.0000 ug | ORAL_TABLET | Freq: Every day | ORAL | Status: DC

## 2010-09-18 ENCOUNTER — Encounter: Payer: Self-pay | Admitting: Cardiovascular Disease

## 2010-09-19 ENCOUNTER — Ambulatory Visit (INDEPENDENT_AMBULATORY_CARE_PROVIDER_SITE_OTHER): Payer: Medicare Other | Admitting: Family Medicine

## 2010-09-19 ENCOUNTER — Other Ambulatory Visit (INDEPENDENT_AMBULATORY_CARE_PROVIDER_SITE_OTHER): Payer: Medicare Other

## 2010-09-19 DIAGNOSIS — K219 Gastro-esophageal reflux disease without esophagitis: Secondary | ICD-10-CM

## 2010-09-19 DIAGNOSIS — E7800 Pure hypercholesterolemia, unspecified: Secondary | ICD-10-CM

## 2010-09-19 DIAGNOSIS — I10 Essential (primary) hypertension: Secondary | ICD-10-CM

## 2010-09-19 DIAGNOSIS — E039 Hypothyroidism, unspecified: Secondary | ICD-10-CM

## 2010-09-19 DIAGNOSIS — E538 Deficiency of other specified B group vitamins: Secondary | ICD-10-CM

## 2010-09-19 DIAGNOSIS — K5730 Diverticulosis of large intestine without perforation or abscess without bleeding: Secondary | ICD-10-CM

## 2010-09-19 LAB — CBC WITH DIFFERENTIAL/PLATELET
Basophils Absolute: 0 10*3/uL (ref 0.0–0.1)
Eosinophils Absolute: 0.2 10*3/uL (ref 0.0–0.7)
Hemoglobin: 11.7 g/dL — ABNORMAL LOW (ref 13.0–17.0)
Lymphocytes Relative: 19.2 % (ref 12.0–46.0)
MCHC: 33.2 g/dL (ref 30.0–36.0)
Monocytes Relative: 8.2 % (ref 3.0–12.0)
Neutrophils Relative %: 68.9 % (ref 43.0–77.0)
RDW: 13.9 % (ref 11.5–14.6)

## 2010-09-19 LAB — HEPATIC FUNCTION PANEL
AST: 34 U/L (ref 0–37)
Alkaline Phosphatase: 82 U/L (ref 39–117)
Bilirubin, Direct: 0.2 mg/dL (ref 0.0–0.3)
Total Bilirubin: 1 mg/dL (ref 0.3–1.2)

## 2010-09-19 LAB — BASIC METABOLIC PANEL
GFR: 62.9 mL/min (ref >60.00)
Potassium: 4.4 mEq/L (ref 3.5–5.1)
Sodium: 142 mEq/L (ref 135–145)

## 2010-09-19 LAB — T4, FREE: Free T4: 1.14 ng/dL (ref 0.60–1.60)

## 2010-09-19 LAB — LIPID PANEL
Cholesterol: 153 mg/dL (ref 0–200)
HDL: 66.2 mg/dL (ref >39.00)
Triglycerides: 50 mg/dL (ref 0.0–149.0)
VLDL: 10 mg/dL (ref 0.0–40.0)

## 2010-09-19 LAB — T3, FREE: T3, Free: 2.2 pg/mL — ABNORMAL LOW (ref 2.3–4.2)

## 2010-09-19 MED ORDER — CYANOCOBALAMIN 1000 MCG/ML IJ SOLN
1000.0000 ug | Freq: Once | INTRAMUSCULAR | Status: AC
  Administered 2010-09-19: 1000 ug via INTRAMUSCULAR

## 2010-09-22 NOTE — Progress Notes (Signed)
  Subjective:    Patient ID: [name] [name], male    DOB: Feb 05, 1923, 77 y.o.   MRN: [id number]  HPI  Here for B12 injection.  Review of Systems     Objective:   Physical Exam        Assessment & Plan:

## 2010-09-24 ENCOUNTER — Encounter: Payer: Self-pay | Admitting: Family Medicine

## 2010-09-24 ENCOUNTER — Ambulatory Visit (INDEPENDENT_AMBULATORY_CARE_PROVIDER_SITE_OTHER): Payer: Medicare Other | Admitting: Emergency Medicine

## 2010-09-24 ENCOUNTER — Ambulatory Visit (INDEPENDENT_AMBULATORY_CARE_PROVIDER_SITE_OTHER): Payer: Medicare Other | Admitting: Family Medicine

## 2010-09-24 VITALS — BP 110/72 | HR 66 | Temp 97.8°F | Ht 71.5 in | Wt 164.1 lb

## 2010-09-24 DIAGNOSIS — E538 Deficiency of other specified B group vitamins: Secondary | ICD-10-CM

## 2010-09-24 DIAGNOSIS — Z7901 Long term (current) use of anticoagulants: Secondary | ICD-10-CM

## 2010-09-24 DIAGNOSIS — Z0000 Encounter for general adult medical examination without abnormal findings: Secondary | ICD-10-CM

## 2010-09-24 DIAGNOSIS — E039 Hypothyroidism, unspecified: Secondary | ICD-10-CM

## 2010-09-24 DIAGNOSIS — I10 Essential (primary) hypertension: Secondary | ICD-10-CM

## 2010-09-24 DIAGNOSIS — I4891 Unspecified atrial fibrillation: Secondary | ICD-10-CM

## 2010-09-24 DIAGNOSIS — E785 Hyperlipidemia, unspecified: Secondary | ICD-10-CM

## 2010-09-24 LAB — POCT INR: INR: 1.9

## 2010-09-24 MED ORDER — AMIODARONE HCL 200 MG PO TABS: ORAL_TABLET | ORAL | Status: DC

## 2010-09-24 NOTE — Patient Instructions (Signed)
LOOK AT YOUR AMIODARONE BOTTLE AND MAKE SURE THAT IT SAYS 200 MG ON IT. IF IT SAYS 100 MG, THEN YOU NEED TO BE TAKING 2 TABLETS A DAY UNTIL YOU FILL YOUR NEW PRESCRIPTION.  PLEASE TAKE 1/2 A TABLET A DAY on Saturday and Sunday   F/u with me in 6 months

## 2010-09-24 NOTE — Progress Notes (Signed)
[name] [name], a 77 y.o. male presents today in the office for the following:  Medicare wellness and f/u multiple medical problems:  I have personally reviewed the Medicare Annual Wellness questionnaire and have noted 1. The patient's medical and social history 2. Their use of alcohol, tobacco or illicit drugs 3. Their current medications and supplements 4. The patient's functional ability including ADL's, fall risks, home safety risks and hearing or visual             impairment. 5. Diet and physical activities 6. Evidence for depression or mood disorders  The patients weight, height, BMI and visual acuity have been recorded in the chart I have made referrals, counseling and provided education to the patient based review of the above and I have provided the pt with a written personalized care plan for preventive services.  I have provided the patient with a copy of your personalized plan for preventive services. Instructed to take the time to review along with their updated medication list.  Health Maintenance Summary Reviewed and updated, unless pt declines services.  Tobacco History Reviewed. Alcohol: No concerns, no excessive use Exercise Habits: Some activity, rec at least 30 mins 5 times a week STD concerns: no risk or activity to increase risk Drug Use: None  Health Maintenance  Topic Date Due  . Influenza Vaccine  12/29/2010  . Colonoscopy  08/29/2011  . Tetanus/tdap  03/30/2013  . Pneumococcal Polysaccharide Vaccine Age 5 And Over  Completed  . Zostavax  Completed    Labs reviewed with the patient.   F/u medical problems:  HTN: Tolerating all medications without side effects Stable and at goal No CP, no sob. No HA.  BP Readings from Last 3 Encounters:  09/24/10 110/72  04/04/10 104/62  03/18/10 [id number]    Basic Metabolic Panel:    Component Value Date/Time   NA 142 09/19/2010 0808   K 4.4 09/19/2010 0808   CL 112 09/19/2010 0808   CO2 27 09/19/2010 0808   BUN  15 09/19/2010 0808   CREATININE 1.2 09/19/2010 0808   GLUCOSE 86 09/19/2010 0808   CALCIUM 8.5 09/19/2010 0808    2.  Lipids: Doing well, stable. Tolerating meds fine with no SE. Panel reviewed with patient.  Lipids:    Component Value Date/Time   CHOL 153 09/19/2010 0808   TRIG 50.0 09/19/2010 0808   HDL 66.20 09/19/2010 0808   VLDL 10.0 09/19/2010 0808   CHOLHDL 2 09/19/2010 0808    Lab Results  Component Value Date   ALT 29 09/19/2010   AST 34 09/19/2010   ALKPHOS 82 09/19/2010   BILITOT 1.0 09/19/2010   3. Atrial fibrillation. The patient is on amiodarone, as well as Coumadin, and he has a pacemaker. Compliant with Coumadin. Asymptomatic. Of note, the patient is currently taking a lower dose of amiodarone, 100 mg p.o. Daily, and he had been on 200 mg daily with the exception Saturday and Sunday for some time. It appears like there was an error during the refill process around the time of  The computer conversion in EMR. All this was discussed with Mr. Maharaj. No adverse outcome.  4. B12, taking inj Lab Results  Component Value Date   VITAMINB12 423 09/19/2010   5. Hypothyroid:  Thyroid: No symptoms. Labs reviewed. Denies cold / heat intolerance, dry skin, hair loss. No goiter.  Lab Results  Component Value Date   TSH 2.37 09/19/2010   General: Denies fever, chills, sweats. No significant weight loss.  Eyes: Denies blurring,significant itching ENT: Denies earache, sore throat, and hoarseness. Cardiovascular: Denies chest pains, palpitations, dyspnea on exertion Respiratory: Denies cough, dyspnea at rest,wheeezing Breast: no concerns about lumps GI: Denies nausea, vomiting, diarrhea, constipation, change in bowel habits, abdominal pain, melena, hematochezia. HERNIA SITE HAS HEALED OVER GU: Denies urinary flow / outflow problems. No STD concerns. Musculoskeletal: Denies back pain, joint pain Derm: Denies rash, itching Neuro: Denies  paresthesias, frequent falls, frequent  headaches Psych: Denies depression, anxiety Endocrine: Denies cold intolerance, heat intolerance, polydipsia Heme: Denies enlarged lymph nodes Allergy: No hayfever   Physical Exam  Blood pressure 110/72, pulse 66, temperature 97.8 F (36.6 C), temperature source Oral, height 5' 11.5" (1.816 m), weight 164 lb 1.9 oz (74.444 kg), SpO2 99.00%.  PE: GEN: well developed, well nourished, no acute distress Eyes: conjunctiva and lids normal, PERRLA, EOMI ENT: TM clear, nares clear, oral exam WNL Neck: supple, no lymphadenopathy, no thyromegaly, no JVD Pulm: clear to auscultation and percussion, respiratory effort normal CV: regular rate and rhythm, S1-S2, no murmur, rub or gallop, no bruits, peripheral pulses normal and symmetric, no cyanosis, clubbing, edema or varicosities Chest: no scars, masses, no gynecomastia   GI: soft, non-tender; no hepatosplenomegaly, masses; active bowel sounds all quadrants GU: no hernia Lymph: no cervical, axillary or inguinal adenopathy MSK: gait normal, muscle tone and strength WNL, no joint swelling, effusions, discoloration, crepitus  SKIN: clear, good turgor, color WNL, no rashes, lesions, or ulcerations Neuro: normal mental status, normal strength, sensation, and motion Psych: alert; oriented to person, place and time, normally interactive and not anxious or depressed in appearance.  Assessment and Plan: 1.  Medicare wellness: The patient's preventative maintenance and recommended screening tests for an annual wellness exam were reviewed in full today. Brought up to date unless services declined.  Counselled on the importance of diet, exercise, and its role in overall health and mortality. The patient's FH and SH was reviewed, including their home life, tobacco status, and drug and alcohol status. 2. HTN, stable, cont meds 3. Lipids: stable, cont meds 4. B12 def: doing well, cont inj 5 Hypothyroid: Stable, cont current dosing 5. Parox A. Fib: adjusted  Amiodarone dosing to his former dose of 200 mg M-F, 100 mg on Sat and Sun

## 2010-09-27 ENCOUNTER — Encounter: Payer: Self-pay | Admitting: Family Medicine

## 2010-10-15 ENCOUNTER — Ambulatory Visit (INDEPENDENT_AMBULATORY_CARE_PROVIDER_SITE_OTHER): Payer: Medicare Other | Admitting: Emergency Medicine

## 2010-10-15 DIAGNOSIS — I4891 Unspecified atrial fibrillation: Secondary | ICD-10-CM

## 2010-10-15 DIAGNOSIS — Z7901 Long term (current) use of anticoagulants: Secondary | ICD-10-CM

## 2010-10-22 ENCOUNTER — Ambulatory Visit (INDEPENDENT_AMBULATORY_CARE_PROVIDER_SITE_OTHER): Payer: Medicare Other | Admitting: *Deleted

## 2010-10-22 DIAGNOSIS — I495 Sick sinus syndrome: Secondary | ICD-10-CM

## 2010-10-22 NOTE — Progress Notes (Signed)
Pacer check/[facility] [place]

## 2010-11-12 ENCOUNTER — Ambulatory Visit (INDEPENDENT_AMBULATORY_CARE_PROVIDER_SITE_OTHER): Payer: Medicare Other | Admitting: Emergency Medicine

## 2010-11-12 DIAGNOSIS — Z7901 Long term (current) use of anticoagulants: Secondary | ICD-10-CM

## 2010-11-12 DIAGNOSIS — I4891 Unspecified atrial fibrillation: Secondary | ICD-10-CM

## 2010-11-21 ENCOUNTER — Other Ambulatory Visit: Payer: Self-pay | Admitting: *Deleted

## 2010-11-21 MED ORDER — AMIODARONE HCL 200 MG PO TABS: ORAL_TABLET | ORAL | Status: DC

## 2010-12-10 ENCOUNTER — Ambulatory Visit (INDEPENDENT_AMBULATORY_CARE_PROVIDER_SITE_OTHER): Payer: Medicare Other | Admitting: *Deleted

## 2010-12-10 ENCOUNTER — Encounter: Payer: Self-pay | Admitting: *Deleted

## 2010-12-10 ENCOUNTER — Ambulatory Visit (INDEPENDENT_AMBULATORY_CARE_PROVIDER_SITE_OTHER): Payer: Medicare Other | Admitting: Emergency Medicine

## 2010-12-10 DIAGNOSIS — E538 Deficiency of other specified B group vitamins: Secondary | ICD-10-CM

## 2010-12-10 DIAGNOSIS — I4891 Unspecified atrial fibrillation: Secondary | ICD-10-CM

## 2010-12-10 DIAGNOSIS — Z7901 Long term (current) use of anticoagulants: Secondary | ICD-10-CM

## 2010-12-10 LAB — POCT INR: INR: 2.4

## 2010-12-10 MED ORDER — CYANOCOBALAMIN 1000 MCG/ML IJ SOLN
1000.0000 ug | Freq: Once | INTRAMUSCULAR | Status: AC
  Administered 2010-12-10: 1000 ug via INTRAMUSCULAR

## 2010-12-10 NOTE — Progress Notes (Signed)
  Subjective:    Patient ID: [name] [name], male    DOB: 1923/02/23, 77 y.o.   MRN: [id number]  HPI    Review of Systems     Objective:   Physical Exam        Assessment & Plan:  Patient received b12 at nurse visit today

## 2010-12-11 ENCOUNTER — Ambulatory Visit (INDEPENDENT_AMBULATORY_CARE_PROVIDER_SITE_OTHER): Payer: Medicare Other | Admitting: Gastroenterology

## 2010-12-11 ENCOUNTER — Encounter: Payer: Self-pay | Admitting: Gastroenterology

## 2010-12-11 VITALS — BP 110/60 | HR 68 | Ht 71.0 in | Wt 164.0 lb

## 2010-12-11 DIAGNOSIS — E538 Deficiency of other specified B group vitamins: Secondary | ICD-10-CM

## 2010-12-11 DIAGNOSIS — K5730 Diverticulosis of large intestine without perforation or abscess without bleeding: Secondary | ICD-10-CM

## 2010-12-11 DIAGNOSIS — K219 Gastro-esophageal reflux disease without esophagitis: Secondary | ICD-10-CM

## 2010-12-11 MED ORDER — PANTOPRAZOLE SODIUM 40 MG PO TBEC: 40.0000 mg | DELAYED_RELEASE_TABLET | Freq: Every day | ORAL | Status: DC

## 2010-12-11 NOTE — Progress Notes (Signed)
This is a very pleasant 77 year old Caucasian male with a past history of lower GI bleed from diverticulosis, also mild radiation proctitis. He currently is asymptomatic and denies any GI issues. He does take Protonix 40 mg a day for chronic GERD. He has regular bowel movements and denies melena, hematochezia, or abdominal pain. He is chronically anticoagulated for his cardiovascular issues, and currently denies cardiovascular or pulmonary complaints. He does take B12 injections per a history of B12 deficiency. He follows a regular diet denies anorexia or weight loss or any hepatobiliary complaints. He is followed closely by primary care and cardiology appear  Current Medications, Allergies, Past Medical History, Past Surgical History, Family History and Social History were reviewed in [employer] record.  Pertinent Review of Systems Negative   Physical Exam: Awake alert no acute distress. He appears his stated age. I cannot appreciate stigmata of chronic liver disease or thyromegaly. His chest is clear and he is a regular rhythm without murmurs gallops or rubs. There is no hepatosplenomegaly, abdominal masses or tenderness. Bowel sounds are normal. There is no peripheral edema, phlebitis, or swollen joints. Mental status is normal. Assessment and Plan: Continue Protonix for chronic GERD. The patient does not need followup endoscopy or colonoscopy at this time. He is to continue a high-fiber diet and daily Benefiber as tolerated. Have also asked him to continue his B12 replacement therapy and other medications as listed and reviewed in his record. We'll see him on a when necessary basis as needed. Encounter Diagnoses  Name Primary?  . Esophageal reflux Yes  . Diverticulosis of colon (without mention of hemorrhage)   . B12 deficiency    Please copy her primary care physician, referring physician, and pertinent subspecialists.

## 2010-12-11 NOTE — Patient Instructions (Signed)
Your prescription(s) have been sent to you pharmacy for 90 days.

## 2011-01-06 ENCOUNTER — Other Ambulatory Visit: Payer: Self-pay | Admitting: Family Medicine

## 2011-01-06 DIAGNOSIS — E538 Deficiency of other specified B group vitamins: Secondary | ICD-10-CM

## 2011-01-07 ENCOUNTER — Ambulatory Visit (INDEPENDENT_AMBULATORY_CARE_PROVIDER_SITE_OTHER): Payer: Medicare Other | Admitting: Emergency Medicine

## 2011-01-07 DIAGNOSIS — Z7901 Long term (current) use of anticoagulants: Secondary | ICD-10-CM

## 2011-01-07 DIAGNOSIS — I4891 Unspecified atrial fibrillation: Secondary | ICD-10-CM

## 2011-01-09 ENCOUNTER — Ambulatory Visit (INDEPENDENT_AMBULATORY_CARE_PROVIDER_SITE_OTHER): Payer: Medicare Other | Admitting: *Deleted

## 2011-01-09 DIAGNOSIS — E538 Deficiency of other specified B group vitamins: Secondary | ICD-10-CM

## 2011-01-09 MED ORDER — CYANOCOBALAMIN 1000 MCG/ML IJ SOLN
1000.0000 ug | Freq: Once | INTRAMUSCULAR | Status: AC
  Administered 2011-01-09: 1000 ug via INTRAMUSCULAR

## 2011-01-28 ENCOUNTER — Encounter: Payer: Self-pay | Admitting: Family Medicine

## 2011-01-28 ENCOUNTER — Ambulatory Visit (INDEPENDENT_AMBULATORY_CARE_PROVIDER_SITE_OTHER): Payer: Medicare Other | Admitting: Family Medicine

## 2011-01-28 VITALS — BP 120/60 | HR 67 | Temp 97.7°F | Ht 71.0 in | Wt 166.0 lb

## 2011-01-28 DIAGNOSIS — M25519 Pain in unspecified shoulder: Secondary | ICD-10-CM

## 2011-01-28 DIAGNOSIS — M542 Cervicalgia: Secondary | ICD-10-CM

## 2011-01-28 MED ORDER — TRAMADOL HCL 50 MG PO TABS: 50.0000 mg | ORAL_TABLET | Freq: Four times a day (QID) | ORAL | Status: AC | PRN

## 2011-01-28 MED ORDER — CELECOXIB 200 MG PO CAPS: 200.0000 mg | ORAL_CAPSULE | Freq: Every day | ORAL | Status: AC

## 2011-01-28 NOTE — Progress Notes (Signed)
  Subjective:    Patient ID: [name] [name], male    DOB: 1922/06/28, 77 y.o.   MRN: [id number]  HPI  [name] [name], a 77 y.o. male presents today in the office for the following:    Pleasant elderly gentleman with before 5 day history of left-sided posterior neck pain, chronically on Coumadin and amiodarone for atrial fibrillation.  Got a cold last Thursday or Friday. Started to take some robitussin. Then in some lower right back pain, started to get a crick and now into his lower left neck. Now with moving to the left, will hurt him a good bit. The night before last. Slept in the chair. Yesterday took some alleve.  Slept better last. Night.  He has had to sleep on the couch or in a chair the last 2 nights. He is feeling a little bit better today. He did use a heating pad yesterday.  No numbness or tingling or change in strength  The PMH, PSH, Social History, Family History, Medications, and allergies have been reviewed in [facility], and have been updated if relevant.   Review of Systems REVIEW OF SYSTEMS  GEN: No fevers, chills. Nontoxic. Primarily MSK c/o today. MSK: Detailed in the HPI GI: tolerating PO intake without difficulty Neuro: No numbness, parasthesias, or tingling associated. Otherwise the pertinent positives of the ROS are noted above.      Objective:   Physical Exam   Physical Exam  Blood pressure 120/60, pulse 67, temperature 97.7 F (36.5 C), temperature source Oral, height 5\' 11"  (1.803 m), weight 166 lb (75.297 kg), SpO2 98.00%.  GEN: Well-developed,well-nourished,in no acute distress; alert,appropriate and cooperative throughout examination HEENT: Normocephalic and atraumatic without obvious abnormalities. Ears, externally no deformities PULM: Breathing comfortably in no respiratory distress EXT: No clubbing, cyanosis, or edema PSYCH: Normally interactive. Cooperative during the interview. Pleasant. Friendly and conversant. Not anxious or depressed appearing.  Normal, full affect.  CERVICAL SPINE EXAM Range of motion: Flexion, extension, lateral bending, and rotation: Notably limited, particularly with lateral bending to the right as well is right-sided rotation. Primary pain is in the left scalene musculature. Nontender on the spinous processes. Overall restriction of motion is at least greater than 50% in all planes of motion, and approaching 60% at the worst. Pain with terminal motion: y Spinous Processes: NT SCM: NT Upper paracervical muscles: above, mild Upper traps: NT C5-T1 intact, sensation and motor       Assessment & Plan:   1. Neck pain  celecoxib (CELEBREX) 200 MG capsule, traMADol (ULTRAM) 50 MG tablet  2. Shoulder pain  celecoxib (CELEBREX) 200 MG capsule, traMADol (ULTRAM) 50 MG tablet    Proceed conservatively, heat, range of motion, neck range of motion and stretching reviewed with the patient. Given he is on Coumadin, place him on Celebrex for a short time only. He is due to call in about 10 days if he is not improving, and at that time I'll have him see physical therapy

## 2011-02-04 ENCOUNTER — Ambulatory Visit (INDEPENDENT_AMBULATORY_CARE_PROVIDER_SITE_OTHER): Payer: Medicare Other | Admitting: Emergency Medicine

## 2011-02-04 DIAGNOSIS — Z7901 Long term (current) use of anticoagulants: Secondary | ICD-10-CM

## 2011-02-04 DIAGNOSIS — I4891 Unspecified atrial fibrillation: Secondary | ICD-10-CM

## 2011-02-10 ENCOUNTER — Ambulatory Visit (INDEPENDENT_AMBULATORY_CARE_PROVIDER_SITE_OTHER): Payer: Medicare Other | Admitting: *Deleted

## 2011-02-10 DIAGNOSIS — E538 Deficiency of other specified B group vitamins: Secondary | ICD-10-CM

## 2011-02-10 DIAGNOSIS — Z23 Encounter for immunization: Secondary | ICD-10-CM

## 2011-02-10 MED ORDER — CYANOCOBALAMIN 1000 MCG/ML IJ SOLN
1000.0000 ug | Freq: Once | INTRAMUSCULAR | Status: AC
  Administered 2011-02-10: 1000 ug via INTRAMUSCULAR

## 2011-02-10 NOTE — Progress Notes (Signed)
B12 injection and flu shot given during nurse visit today.

## 2011-02-25 ENCOUNTER — Ambulatory Visit (INDEPENDENT_AMBULATORY_CARE_PROVIDER_SITE_OTHER): Payer: Medicare Other | Admitting: Emergency Medicine

## 2011-02-25 DIAGNOSIS — I4891 Unspecified atrial fibrillation: Secondary | ICD-10-CM

## 2011-02-25 DIAGNOSIS — Z7901 Long term (current) use of anticoagulants: Secondary | ICD-10-CM

## 2011-02-25 LAB — POCT INR: INR: 2.4

## 2011-03-13 ENCOUNTER — Telehealth: Payer: Self-pay | Admitting: Internal Medicine

## 2011-03-13 ENCOUNTER — Telehealth: Payer: Self-pay | Admitting: *Deleted

## 2011-03-13 NOTE — Telephone Encounter (Signed)
Pt needs dental on Monday and needs to stop plavix and coumadin. Is pt ok to do this? appt is at 9:45 on Monday. Ok to lm on machine

## 2011-03-13 NOTE — Telephone Encounter (Signed)
Discussed with Dr Lovena Le.  Patient okay to hold Coumadin for root canal and resume when okay with dentist.

## 2011-03-13 NOTE — Telephone Encounter (Signed)
Pt notified of msg below

## 2011-03-13 NOTE — Telephone Encounter (Signed)
See other phone note

## 2011-03-13 NOTE — Telephone Encounter (Signed)
I do not see where pt is on Plavix, no stent hx. Pt takes warfarin, last INR 11/28 2.4; pt last seen by Dr. Lovena Le 04/04/10, next f/u recall for 03/2011. Hx PPM and 2 catheter ablations. On warfarin for paroxysmal a fib, in NSR last time we saw pt in 1/12. He has been coming routinely for coumadin visits since. Is pt clear to hold warfarin starting now for root canal on Monday 12/17, thanks.

## 2011-03-19 ENCOUNTER — Other Ambulatory Visit (INDEPENDENT_AMBULATORY_CARE_PROVIDER_SITE_OTHER): Payer: Medicare Other

## 2011-03-19 DIAGNOSIS — E538 Deficiency of other specified B group vitamins: Secondary | ICD-10-CM

## 2011-03-19 LAB — VITAMIN B12: Vitamin B-12: 432 pg/mL (ref 211–911)

## 2011-03-26 ENCOUNTER — Ambulatory Visit: Payer: Medicare Other | Admitting: Family Medicine

## 2011-04-01 ENCOUNTER — Encounter: Payer: Self-pay | Admitting: Family Medicine

## 2011-04-01 ENCOUNTER — Ambulatory Visit (INDEPENDENT_AMBULATORY_CARE_PROVIDER_SITE_OTHER): Payer: Medicare Other | Admitting: Emergency Medicine

## 2011-04-01 ENCOUNTER — Ambulatory Visit (INDEPENDENT_AMBULATORY_CARE_PROVIDER_SITE_OTHER): Payer: Medicare Other | Admitting: Family Medicine

## 2011-04-01 VITALS — BP 120/72 | HR 75 | Temp 97.8°F | Ht 71.75 in | Wt 166.8 lb

## 2011-04-01 DIAGNOSIS — E785 Hyperlipidemia, unspecified: Secondary | ICD-10-CM

## 2011-04-01 DIAGNOSIS — Z7901 Long term (current) use of anticoagulants: Secondary | ICD-10-CM | POA: Diagnosis not present

## 2011-04-01 DIAGNOSIS — J069 Acute upper respiratory infection, unspecified: Secondary | ICD-10-CM

## 2011-04-01 DIAGNOSIS — I4891 Unspecified atrial fibrillation: Secondary | ICD-10-CM | POA: Diagnosis not present

## 2011-04-01 DIAGNOSIS — E538 Deficiency of other specified B group vitamins: Secondary | ICD-10-CM

## 2011-04-01 DIAGNOSIS — E039 Hypothyroidism, unspecified: Secondary | ICD-10-CM | POA: Diagnosis not present

## 2011-04-01 LAB — POCT INR: INR: 3.1

## 2011-04-01 MED ORDER — CYANOCOBALAMIN 1000 MCG/ML IJ SOLN
1000.0000 ug | Freq: Once | INTRAMUSCULAR | Status: AC
  Administered 2011-04-01: 1000 ug via INTRAMUSCULAR

## 2011-04-01 NOTE — Progress Notes (Signed)
  Patient Name: [name] [name] Date of Birth: 06-14-1922 Age: 78 y.o. Medical Record Number: [id number] Gender: male Date of Encounter: 04/01/2011  History of Present Illness:  [name] [name] is a 78 y.o. very pleasant male patient who presents with the following:  Cold - in chest, no fever. Some ST. No smoking history. Some congestion and coughing  B12 has been stable with q monthly inj Lab Results  Component Value Date   [id number] 03/19/2011   Thyroid: No symptoms. Labs reviewed. Denies cold / heat intolerance, dry skin, hair loss. No goiter.  Lab Results  Component Value Date   TSH 2.37 09/19/2010   Lipids: Doing well, stable. Tolerating meds fine with no SE. Panel reviewed with patient.  Lipids:    Component Value Date/Time   CHOL 153 09/19/2010 0808   TRIG 50.0 09/19/2010 0808   HDL 66.20 09/19/2010 0808   VLDL 10.0 09/19/2010 0808   CHOLHDL 2 09/19/2010 0808    Lab Results  Component Value Date   ALT 29 09/19/2010   AST 34 09/19/2010   ALKPHOS 82 09/19/2010   BILITOT 1.0 09/19/2010    Past Medical History, Surgical History, Social History, Family History, Problem List, Medications, and Allergies have been reviewed and updated if relevant.  Review of Systems:  GEN: current URI GI: No n/v/d, eating normally Pulm: No SOB Interactive and getting along well at home.  Otherwise, ROS is as per the HPI.   Physical Examination: Filed Vitals:   04/01/11 0947  BP: 120/72  Pulse: 75  Temp: 97.8 F (36.6 C)  TempSrc: Oral  Height: 5' 11.75" (1.822 m)  Weight: 166 lb 12.8 oz (75.66 kg)  SpO2: 98%    Body mass index is 22.78 kg/(m^2).   GEN: WDWN, NAD, Non-toxic, A & O x 3 HEENT: Atraumatic, Normocephalic. Neck supple. No masses, No LAD. TM clear, oropharynx clear Ears and Nose: No external deformity. CV: RRR, No M/G/R. No JVD. No thrill. No extra heart sounds. PULM: CTA B, no wheezes, crackles, rhonchi. No retractions. No resp. distress. No accessory  muscle use. EXTR: No c/c/e NEURO Normal gait.  PSYCH: Normally interactive. Conversant. Not depressed or anxious appearing.  Calm demeanor.    Assessment and Plan: 1. B12 deficiency  cyanocobalamin ((VITAMIN B-12)) injection 1,000 mcg  2. URI (upper respiratory infection)    3. HYPOTHYROIDISM    4. HYPERLIPIDEMIA       URI I discussed upper respiratory tract infections with the patient and explained viral infections in general. Thyroid, lipids, and everything else has been stable for some time.

## 2011-04-01 NOTE — Patient Instructions (Signed)
F/u 6 months for medicare wellness

## 2011-04-02 ENCOUNTER — Other Ambulatory Visit: Payer: Self-pay | Admitting: Internal Medicine

## 2011-04-02 MED ORDER — PRAVASTATIN SODIUM 40 MG PO TABS: 40.0000 mg | ORAL_TABLET | Freq: Every day | ORAL | Status: DC

## 2011-04-02 MED ORDER — WARFARIN SODIUM 4 MG PO TABS: 4.0000 mg | ORAL_TABLET | ORAL | Status: DC

## 2011-04-02 MED ORDER — AMLODIPINE BESYLATE 2.5 MG PO TABS: 2.5000 mg | ORAL_TABLET | Freq: Every day | ORAL | Status: DC

## 2011-04-02 NOTE — Telephone Encounter (Signed)
Faxed rx's to pharmacy

## 2011-04-21 ENCOUNTER — Encounter: Payer: Self-pay | Admitting: Internal Medicine

## 2011-04-21 ENCOUNTER — Ambulatory Visit (INDEPENDENT_AMBULATORY_CARE_PROVIDER_SITE_OTHER): Payer: Medicare Other | Admitting: Internal Medicine

## 2011-04-21 DIAGNOSIS — I495 Sick sinus syndrome: Secondary | ICD-10-CM | POA: Diagnosis not present

## 2011-04-21 DIAGNOSIS — Z950 Presence of cardiac pacemaker: Secondary | ICD-10-CM

## 2011-04-21 DIAGNOSIS — I4891 Unspecified atrial fibrillation: Secondary | ICD-10-CM | POA: Diagnosis not present

## 2011-04-21 LAB — PACEMAKER DEVICE OBSERVATION
AL AMPLITUDE: 1.423 mv
AL THRESHOLD: 0.5 V
RV LEAD IMPEDENCE PM: 496 Ohm
RV LEAD THRESHOLD: 1 V

## 2011-04-21 NOTE — Assessment & Plan Note (Signed)
Pacemaker interrogation demonstrates that he is atrial pacing 99% of the time.

## 2011-04-21 NOTE — Assessment & Plan Note (Signed)
His pacemaker is working normally. We'll plan to recheck in several months.

## 2011-04-21 NOTE — Patient Instructions (Signed)
Follow up with Dr. Lovena Le in 6 months.

## 2011-04-21 NOTE — Progress Notes (Signed)
HPI [name] [name] returns today for followup. He is a very pleasant 78 year old man with symptomatic bradycardia, paroxysmal atrial fibrillation, chronic amiodarone therapy, and hypoglycemia. The patient's only complaint today is that he occasionally veers off in one direction or the other and doesn't quite know why. He denies chest pain, shortness of breath, or syncope. He has minimal palpitation. Allergies  Allergen Reactions  . Penicillins      Current Outpatient Prescriptions  Medication Sig Dispense Refill  . alendronate (FOSAMAX) 70 MG tablet Take 1 tablet (70 mg total) by mouth every 7 (seven) days. Take with a full glass of water on an empty stomach.  12 tablet  3  . amiodarone (PACERONE) 200 MG tablet 1 tablet po daily except Sat. And Sunday, where 1/2 tab po is taken  78 tablet  3  . amLODipine (NORVASC) 2.5 MG tablet Take 1 tablet (2.5 mg total) by mouth daily.  90 tablet  3  . Cyanocobalamin (VITAMIN B-12 IJ) Inject 1,000 mcg/mL as directed every 30 (thirty) days.        . dorzolamide-timolol (COSOPT) 22.3-6.8 MG/ML ophthalmic solution Place 1 drop into both eyes 2 (two) times daily.        . ferrous sulfate 325 (65 FE) MG EC tablet Take 325 mg by mouth daily with breakfast.        . levothyroxine (SYNTHROID, LEVOTHROID) 100 MCG tablet Take 1 tablet (100 mcg total) by mouth daily.  90 tablet  3  . Multiple Vitamin (MULTIVITAMIN) tablet Take 1 tablet by mouth daily.        . pantoprazole (PROTONIX) 40 MG tablet Take 1 tablet (40 mg total) by mouth daily.  90 tablet  3  . pravastatin (PRAVACHOL) 40 MG tablet Take 1 tablet (40 mg total) by mouth daily.  90 tablet  3  . torsemide (DEMADEX) 20 MG tablet Take 20 mg by mouth as needed.       . warfarin (COUMADIN) 4 MG tablet Take 1 tablet (4 mg total) by mouth as directed.  90 tablet  3  . Wheat Dextrin (BENEFIBER DRINK MIX PO) Take by mouth as directed.           Past Medical History  Diagnosis Date  . GI hemorrhage   . Radiation  proctitis   . 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 Paroxysmal atrial fibrillation     s/p failed ablation  . Cardiac pacemaker in situ 07/2005    symptomatic bradycardia  . Hyperlipidemia   . Hypertension   . GERD (gastroesophageal reflux disease)   . Unspecified glaucoma   . Vitamin B12 deficiency   . Peptic ulcer, unspecified site, unspecified as acute or chronic, without mention of hemorrhage, perforation, or obstruction 1975  . Personal history of malignant neoplasm of prostate 12/2000    5 weeks radiation, radiation seeds  . Diverticulosis of colon (without mention of hemorrhage)   . Unspecified hypothyroidism   . 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 Sinoatrial node dysfunction     ROS:   All systems reviewed and negative except as noted in the HPI.   Past Surgical History  Procedure Date  . Hernia repair 1994, 2001, 2003    s/p drainage and complications, 123456, [id number] mesh removal  . Insert / replace / remove pacemaker 07/2005    symptomatic bradycardia  . Cardiac electrophysiology mapping and ablation 2001, 2003    failure  . Cataract extraction   .  Cardiac catheterization      Family History  Problem Relation Age of Onset  . Breast cancer Mother   . Bone cancer Father   . Alcohol abuse    . Coronary artery disease    . Dementia    . Diabetes       History   Social History  . Marital Status: Widowed    Spouse Name: N/A    Number of Children: N/A  . Years of Education: N/A   Occupational History  . Not on file.   Social History Main Topics  . Smoking status: Never Smoker   . Smokeless tobacco: Never Used  . Alcohol Use: No  . Drug Use: No  . Sexually Active:    Other Topics Concern  . Not on file   Social History Narrative  . No narrative on file     BP 122/64  Pulse 62  Ht 5\' 11"  (1.803 m)  Wt 75.206 kg (165 lb 12.8 oz)  BMI 23.12 kg/m2  SpO2 96%  Physical Exam:  elderly but Well appearing,  man,  NAD HEENT: Unremarkable Neck:  No JVD, no thyromegally Lymphatics:  No adenopathy Back:  No CVA tenderness Lungs:  Clear with no wheezes, rales, or rhonchi. Well-healed pacemaker incision. HEART:  Regular rate rhythm, no murmurs, no rubs, no clicks Abd:  soft, positive bowel sounds, no organomegally, no rebound, no guarding Ext:  2 plus pulses, no edema, no cyanosis, no clubbing Skin:  No rashes no nodules Neuro:  CN II through XII intact, motor grossly intact  DEVICE  Normal device function.  See PaceArt for details.   Assess/Plan:

## 2011-04-21 NOTE — Assessment & Plan Note (Signed)
He is maintaining sinus rhythm. He will continue amiodarone therapy.

## 2011-04-29 ENCOUNTER — Ambulatory Visit (INDEPENDENT_AMBULATORY_CARE_PROVIDER_SITE_OTHER): Payer: Medicare Other | Admitting: Emergency Medicine

## 2011-04-29 DIAGNOSIS — Z7901 Long term (current) use of anticoagulants: Secondary | ICD-10-CM | POA: Diagnosis not present

## 2011-04-29 DIAGNOSIS — I4891 Unspecified atrial fibrillation: Secondary | ICD-10-CM

## 2011-05-07 ENCOUNTER — Ambulatory Visit (INDEPENDENT_AMBULATORY_CARE_PROVIDER_SITE_OTHER): Payer: Medicare Other

## 2011-05-07 DIAGNOSIS — E538 Deficiency of other specified B group vitamins: Secondary | ICD-10-CM

## 2011-05-07 MED ORDER — CYANOCOBALAMIN 1000 MCG/ML IJ SOLN
1000.0000 ug | Freq: Once | INTRAMUSCULAR | Status: AC
  Administered 2011-05-07: 1000 ug via INTRAMUSCULAR

## 2011-05-27 ENCOUNTER — Ambulatory Visit (INDEPENDENT_AMBULATORY_CARE_PROVIDER_SITE_OTHER): Payer: Medicare Other

## 2011-05-27 DIAGNOSIS — Z7901 Long term (current) use of anticoagulants: Secondary | ICD-10-CM

## 2011-05-27 DIAGNOSIS — I4891 Unspecified atrial fibrillation: Secondary | ICD-10-CM

## 2011-06-05 ENCOUNTER — Ambulatory Visit (INDEPENDENT_AMBULATORY_CARE_PROVIDER_SITE_OTHER): Payer: Medicare Other

## 2011-06-05 DIAGNOSIS — E538 Deficiency of other specified B group vitamins: Secondary | ICD-10-CM

## 2011-06-05 MED ORDER — CYANOCOBALAMIN 1000 MCG/ML IJ SOLN
1000.0000 ug | Freq: Once | INTRAMUSCULAR | Status: AC
  Administered 2011-06-05: 1000 ug via INTRAMUSCULAR

## 2011-06-15 ENCOUNTER — Other Ambulatory Visit: Payer: Self-pay | Admitting: *Deleted

## 2011-06-15 MED ORDER — ALENDRONATE SODIUM 70 MG PO TABS: 70.0000 mg | ORAL_TABLET | ORAL | Status: DC

## 2011-07-07 ENCOUNTER — Ambulatory Visit (INDEPENDENT_AMBULATORY_CARE_PROVIDER_SITE_OTHER): Payer: Medicare Other | Admitting: *Deleted

## 2011-07-07 DIAGNOSIS — E538 Deficiency of other specified B group vitamins: Secondary | ICD-10-CM | POA: Diagnosis not present

## 2011-07-07 DIAGNOSIS — Z8546 Personal history of malignant neoplasm of prostate: Secondary | ICD-10-CM | POA: Diagnosis not present

## 2011-07-07 DIAGNOSIS — C61 Malignant neoplasm of prostate: Secondary | ICD-10-CM | POA: Diagnosis not present

## 2011-07-07 DIAGNOSIS — N390 Urinary tract infection, site not specified: Secondary | ICD-10-CM | POA: Diagnosis not present

## 2011-07-07 MED ORDER — CYANOCOBALAMIN 1000 MCG/ML IJ SOLN
1000.0000 ug | Freq: Once | INTRAMUSCULAR | Status: AC
  Administered 2011-07-07: 1000 ug via INTRAMUSCULAR

## 2011-07-08 ENCOUNTER — Ambulatory Visit (INDEPENDENT_AMBULATORY_CARE_PROVIDER_SITE_OTHER): Payer: Medicare Other

## 2011-07-08 DIAGNOSIS — Z7901 Long term (current) use of anticoagulants: Secondary | ICD-10-CM

## 2011-07-08 DIAGNOSIS — I4891 Unspecified atrial fibrillation: Secondary | ICD-10-CM

## 2011-07-08 LAB — POCT INR: INR: 1.5

## 2011-07-27 DIAGNOSIS — H4010X0 Unspecified open-angle glaucoma, stage unspecified: Secondary | ICD-10-CM | POA: Diagnosis not present

## 2011-07-29 ENCOUNTER — Ambulatory Visit (INDEPENDENT_AMBULATORY_CARE_PROVIDER_SITE_OTHER): Payer: Medicare Other

## 2011-07-29 DIAGNOSIS — Z7901 Long term (current) use of anticoagulants: Secondary | ICD-10-CM | POA: Diagnosis not present

## 2011-07-29 DIAGNOSIS — I4891 Unspecified atrial fibrillation: Secondary | ICD-10-CM | POA: Diagnosis not present

## 2011-07-29 LAB — POCT INR: INR: 1.9

## 2011-08-06 ENCOUNTER — Ambulatory Visit (INDEPENDENT_AMBULATORY_CARE_PROVIDER_SITE_OTHER): Payer: Medicare Other | Admitting: *Deleted

## 2011-08-06 DIAGNOSIS — E538 Deficiency of other specified B group vitamins: Secondary | ICD-10-CM

## 2011-08-06 MED ORDER — CYANOCOBALAMIN 1000 MCG/ML IJ SOLN
1000.0000 ug | Freq: Once | INTRAMUSCULAR | Status: AC
  Administered 2011-08-06: 1000 ug via INTRAMUSCULAR

## 2011-08-20 DIAGNOSIS — N390 Urinary tract infection, site not specified: Secondary | ICD-10-CM | POA: Diagnosis not present

## 2011-08-26 ENCOUNTER — Ambulatory Visit (INDEPENDENT_AMBULATORY_CARE_PROVIDER_SITE_OTHER): Payer: Medicare Other

## 2011-08-26 DIAGNOSIS — Z7901 Long term (current) use of anticoagulants: Secondary | ICD-10-CM | POA: Diagnosis not present

## 2011-08-26 DIAGNOSIS — I4891 Unspecified atrial fibrillation: Secondary | ICD-10-CM

## 2011-08-26 LAB — POCT INR: INR: 2.6

## 2011-08-31 DIAGNOSIS — R3129 Other microscopic hematuria: Secondary | ICD-10-CM | POA: Diagnosis not present

## 2011-09-10 ENCOUNTER — Ambulatory Visit (INDEPENDENT_AMBULATORY_CARE_PROVIDER_SITE_OTHER): Payer: Medicare Other

## 2011-09-10 DIAGNOSIS — E538 Deficiency of other specified B group vitamins: Secondary | ICD-10-CM | POA: Diagnosis not present

## 2011-09-10 MED ORDER — CYANOCOBALAMIN 1000 MCG/ML IJ SOLN
1000.0000 ug | Freq: Once | INTRAMUSCULAR | Status: AC
  Administered 2011-09-10: 1000 ug via INTRAMUSCULAR

## 2011-09-23 ENCOUNTER — Other Ambulatory Visit (INDEPENDENT_AMBULATORY_CARE_PROVIDER_SITE_OTHER): Payer: Medicare Other

## 2011-09-23 DIAGNOSIS — E039 Hypothyroidism, unspecified: Secondary | ICD-10-CM | POA: Diagnosis not present

## 2011-09-23 DIAGNOSIS — Z79899 Other long term (current) drug therapy: Secondary | ICD-10-CM | POA: Diagnosis not present

## 2011-09-23 DIAGNOSIS — E785 Hyperlipidemia, unspecified: Secondary | ICD-10-CM | POA: Diagnosis not present

## 2011-09-23 LAB — BASIC METABOLIC PANEL
CO2: 26 mEq/L (ref 19–32)
Calcium: 8.9 mg/dL (ref 8.4–10.5)
Creatinine, Ser: 1.2 mg/dL (ref 0.4–1.5)
Glucose, Bld: 84 mg/dL (ref 70–99)

## 2011-09-23 LAB — CBC WITH DIFFERENTIAL/PLATELET
Basophils Relative: 0.7 % (ref 0.0–3.0)
Eosinophils Relative: 3.7 % (ref 0.0–5.0)
HCT: 36.3 % — ABNORMAL LOW (ref 39.0–52.0)
Hemoglobin: 11.9 g/dL — ABNORMAL LOW (ref 13.0–17.0)
Lymphs Abs: 1 10*3/uL (ref 0.7–4.0)
Monocytes Relative: 9.9 % (ref 3.0–12.0)
Neutro Abs: 3.4 10*3/uL (ref 1.4–7.7)
WBC: 5.2 10*3/uL (ref 4.5–10.5)

## 2011-09-23 LAB — LIPID PANEL
Total CHOL/HDL Ratio: 2
Triglycerides: 72 mg/dL (ref 0.0–149.0)

## 2011-09-23 LAB — HEPATIC FUNCTION PANEL
Albumin: 3.7 g/dL (ref 3.5–5.2)
Total Protein: 6.6 g/dL (ref 6.0–8.3)

## 2011-09-30 ENCOUNTER — Encounter: Payer: Medicare Other | Admitting: Family Medicine

## 2011-10-07 ENCOUNTER — Ambulatory Visit (INDEPENDENT_AMBULATORY_CARE_PROVIDER_SITE_OTHER): Payer: Medicare Other | Admitting: Family Medicine

## 2011-10-07 ENCOUNTER — Encounter: Payer: Self-pay | Admitting: Family Medicine

## 2011-10-07 VITALS — BP 120/62 | HR 73 | Temp 97.8°F | Ht 71.0 in | Wt 159.0 lb

## 2011-10-07 DIAGNOSIS — Z0000 Encounter for general adult medical examination without abnormal findings: Secondary | ICD-10-CM

## 2011-10-07 DIAGNOSIS — Z1211 Encounter for screening for malignant neoplasm of colon: Secondary | ICD-10-CM

## 2011-10-07 DIAGNOSIS — D649 Anemia, unspecified: Secondary | ICD-10-CM

## 2011-10-07 NOTE — Addendum Note (Signed)
Addended by: Ellamae Sia on: 10/07/2011 12:30 PM   Modules accepted: Orders

## 2011-10-07 NOTE — Progress Notes (Signed)
Therapist, music at [facility] [place] [address] Phone: [phone] Fax: [phone]  Date:  10/07/2011   Name:  [name] [name]   DOB:  1922/05/06   MRN:  [id number]  PCP:  Owens Loffler, MD    Chief Complaint: Annual Exam   History of Present Illness:  [name] [name] is a 78 y.o. very pleasant male patient who presents with the following:  Medicare wellness  Preventative Health Maintenance Visit:  Health Maintenance Summary Reviewed and updated, unless pt declines services.  Tobacco History Reviewed. Alcohol: No concerns, no excessive use Exercise Habits: some walking and yardwork STD concerns: no risk  Drug Use: None  Health Maintenance  Topic Date Due  . Colonoscopy  08/29/2011  . Influenza Vaccine  12/29/2011  . Tetanus/tdap  03/30/2013  . Pneumococcal Polysaccharide Vaccine Age 70 And Over  Completed  . Zostavax  Completed    Labs reviewed with the patient.   Lipids:    Component Value Date/Time   CHOL 150 09/23/2011 0841   TRIG 72.0 09/23/2011 0841   HDL 69.20 09/23/2011 0841   VLDL 14.4 09/23/2011 0841   CHOLHDL 2 09/23/2011 0841    CBC:    Component Value Date/Time   WBC 5.2 09/23/2011 0841   HGB 11.9* 09/23/2011 0841   HCT 36.3* 09/23/2011 0841   PLT 189.0 09/23/2011 0841   MCV 99.9 09/23/2011 0841   NEUTROABS 3.4 09/23/2011 0841   LYMPHSABS 1.0 09/23/2011 0841   MONOABS 0.5 09/23/2011 0841   EOSABS 0.2 09/23/2011 0841   BASOSABS 0.0 09/23/2011 [id number]    Basic Metabolic Panel:    Component Value Date/Time   NA 140 09/23/2011 0841   K 4.1 09/23/2011 0841   CL 108 09/23/2011 0841   CO2 26 09/23/2011 0841   BUN 17 09/23/2011 0841   CREATININE 1.2 09/23/2011 0841   GLUCOSE 84 09/23/2011 0841   CALCIUM 8.9 09/23/2011 0841    Lab Results  Component Value Date   ALT 23 09/23/2011   AST 31 09/23/2011   ALKPHOS 77 09/23/2011   BILITOT 1.0 09/23/2011     Patient Active Problem List  Diagnosis  . HYPOTHYROIDISM  . B12 DEFICIENCY    . HYPERLIPIDEMIA  . GLAUCOMA  . HYPERTENSION, ESSENTIAL NOS  . PAROXYSMAL ATRIAL FIBRILLATION  . GERD  . PEPTIC ULCER DISEASE  . HERN UNS SITE ABD CAV W/O MENTION OBST/GANGREN  . RADIATION PROCTITIS  . DIVERTICULOSIS, COLON  . PROSTATE CANCER, HX OF  . PACEMAKER, PERMANENT  . IRON DEFICIENCY  . Encounter for long-term (current) use of anticoagulants  . Sinoatrial node dysfunction   Past Medical History  Diagnosis Date  . GI hemorrhage   . Radiation proctitis   . 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 Paroxysmal atrial fibrillation     s/p failed ablation  . Cardiac pacemaker in situ 07/2005    symptomatic bradycardia  . Hyperlipidemia   . Hypertension   . GERD (gastroesophageal reflux disease)   . Unspecified glaucoma   . Vitamin B12 deficiency   . Peptic ulcer, unspecified site, unspecified as acute or chronic, without mention of hemorrhage, perforation, or obstruction 1975  . Personal history of malignant neoplasm of prostate 12/2000    5 weeks radiation, radiation seeds  . Diverticulosis of colon (without mention of hemorrhage)   . Unspecified hypothyroidism   . 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 Sinoatrial node dysfunction  Past Surgical History  Procedure Date  . Hernia repair 1994, 2001, 2003    s/p drainage and complications, 123456, [id number] mesh removal  . Insert / replace / remove pacemaker 07/2005    symptomatic bradycardia  . Cardiac electrophysiology mapping and ablation 2001, 2003    failure  . Cataract extraction   . Cardiac catheterization    History  Substance Use Topics  . Smoking status: Never Smoker   . Smokeless tobacco: Never Used  . Alcohol Use: No   Family History  Problem Relation Age of Onset  . Breast cancer Mother   . Bone cancer Father   . Alcohol abuse    . Coronary artery disease    . Dementia    . Diabetes     Allergies  Allergen Reactions  . Penicillins     Medication list has been  reviewed and updated.  Current Outpatient Prescriptions on File Prior to Visit  Medication Sig Dispense Refill  . alendronate (FOSAMAX) 70 MG tablet Take 1 tablet (70 mg total) by mouth every 7 (seven) days. Take with a full glass of water on an empty stomach.  12 tablet  3  . amiodarone (PACERONE) 200 MG tablet 1 tablet po daily except Sat. And Sunday, where 1/2 tab po is taken  78 tablet  3  . amLODipine (NORVASC) 2.5 MG tablet Take 1 tablet (2.5 mg total) by mouth daily.  90 tablet  3  . Cyanocobalamin (VITAMIN B-12 IJ) Inject 1,000 mcg/mL as directed every 30 (thirty) days.        . dorzolamide-timolol (COSOPT) 22.3-6.8 MG/ML ophthalmic solution Place 1 drop into both eyes 2 (two) times daily.        . ferrous sulfate 325 (65 FE) MG EC tablet Take 325 mg by mouth daily with breakfast.        . Multiple Vitamin (MULTIVITAMIN) tablet Take 1 tablet by mouth daily.        . pantoprazole (PROTONIX) 40 MG tablet Take 1 tablet (40 mg total) by mouth daily.  90 tablet  3  . pravastatin (PRAVACHOL) 40 MG tablet Take 1 tablet (40 mg total) by mouth daily.  90 tablet  3  . torsemide (DEMADEX) 20 MG tablet Take 20 mg by mouth as needed.       . warfarin (COUMADIN) 4 MG tablet Take 1 tablet (4 mg total) by mouth as directed.  90 tablet  3  . Wheat Dextrin (BENEFIBER DRINK MIX PO) Take by mouth as directed.        Marland Kitchen levothyroxine (SYNTHROID, LEVOTHROID) 100 MCG tablet Take 1 tablet (100 mcg total) by mouth daily.  90 tablet  3    Review of Systems:   General: Denies fever, chills, sweats. No significant weight loss. Eyes: Denies blurring,significant itching ENT: Denies earache, sore throat, and hoarseness.  Cardiovascular: Denies chest pains, palpitations, dyspnea on exertion,  Respiratory: Denies cough, dyspnea at rest,wheeezing Breast: no concerns about lumps GI: Denies nausea, vomiting, diarrhea, constipation, change in bowel habits, abdominal pain, melena, hematochezia GU: recent cystoscopy  was normal from micro hematuria. Hernia scar much improved, no drainage Musculoskeletal: Denies back pain, joint pain Derm: Denies rash, itching Neuro: Denies  paresthesias, frequent falls, frequent headaches Psych: Denies depression, anxiety Endocrine: Denies cold intolerance, heat intolerance, polydipsia Heme: Denies enlarged lymph nodes Allergy: No hayfever   Physical Examination: Filed Vitals:   10/07/11 0815  BP: 120/62  Pulse: 73  Temp: 97.8 F (36.6 C)   Filed Vitals:  10/07/11 0815  Height: 5\' 11"  (1.803 m)  Weight: 159 lb (72.122 kg)   Body mass index is 22.18 kg/(m^2). Ideal Body Weight: Weight in (lb) to have BMI = 25: 178.9   GEN: well developed, well nourished, no acute distress Eyes: conjunctiva and lids normal, PERRLA, EOMI ENT: TM clear, nares clear, oral exam WNL Neck: supple, no lymphadenopathy, no thyromegaly, no JVD Pulm: clear to auscultation and percussion, respiratory effort normal CV: regular rate and rhythm, S1-S2, no murmur, rub or gallop, no bruits, peripheral pulses normal and symmetric, no cyanosis, clubbing, edema or varicosities GI: soft, non-tender; no hepatosplenomegaly, masses; active bowel sounds all quadrants GU: defer gu exam, sees urol Lymph: no cervical, axillary or inguinal adenopathy MSK: gait normal, muscle tone and strength WNL, no joint swelling, effusions, discoloration, crepitus  SKIN: clear, good turgor, color WNL, no rashes, lesions, or ulcerations Neuro: normal mental status, normal strength, sensation, and motion Psych: alert; oriented to person, place and time, normally interactive and not anxious or depressed in appearance.  Assessment and Plan: 1. Routine general medical examination at a health care facility    2. Anemia  Fecal occult blood, imunochemical  3. Special screening for malignant neoplasm of colon  Fecal occult blood, imunochemical    I have personally reviewed the Medicare Annual Wellness questionnaire and  have noted 1. The patient's medical and social history 2. Their use of alcohol, tobacco or illicit drugs 3. Their current medications and supplements 4. The patient's functional ability including ADL's, fall risks, home safety risks and hearing or visual             impairment. 5. Diet and physical activities 6. Evidence for depression or mood disorders  The patients weight, height, BMI and visual acuity have been recorded in the chart I have made referrals, counseling and provided education to the patient based review of the above and I have provided the pt with a written personalized care plan for preventive services.  I have provided the patient with a copy of your personalized plan for preventive services. Instructed to take the time to review along with their updated medication list.   Anemia, will check IFOB  Owens Loffler, MD

## 2011-10-12 ENCOUNTER — Other Ambulatory Visit: Payer: Medicare Other

## 2011-10-12 DIAGNOSIS — Z1211 Encounter for screening for malignant neoplasm of colon: Secondary | ICD-10-CM

## 2011-10-12 LAB — FECAL OCCULT BLOOD, IMMUNOCHEMICAL: Fecal Occult Bld: NEGATIVE

## 2011-10-13 ENCOUNTER — Encounter: Payer: Self-pay | Admitting: *Deleted

## 2011-10-13 ENCOUNTER — Ambulatory Visit (INDEPENDENT_AMBULATORY_CARE_PROVIDER_SITE_OTHER): Payer: Medicare Other

## 2011-10-13 DIAGNOSIS — E538 Deficiency of other specified B group vitamins: Secondary | ICD-10-CM | POA: Diagnosis not present

## 2011-10-13 MED ORDER — CYANOCOBALAMIN 1000 MCG/ML IJ SOLN
1000.0000 ug | Freq: Once | INTRAMUSCULAR | Status: AC
  Administered 2011-10-13: 1000 ug via INTRAMUSCULAR

## 2011-10-14 ENCOUNTER — Ambulatory Visit (INDEPENDENT_AMBULATORY_CARE_PROVIDER_SITE_OTHER): Payer: Medicare Other

## 2011-10-14 DIAGNOSIS — Z7901 Long term (current) use of anticoagulants: Secondary | ICD-10-CM | POA: Diagnosis not present

## 2011-10-14 DIAGNOSIS — I4891 Unspecified atrial fibrillation: Secondary | ICD-10-CM

## 2011-10-14 LAB — POCT INR: INR: 2.8

## 2011-11-11 ENCOUNTER — Ambulatory Visit (INDEPENDENT_AMBULATORY_CARE_PROVIDER_SITE_OTHER): Payer: Medicare Other

## 2011-11-11 DIAGNOSIS — Z7901 Long term (current) use of anticoagulants: Secondary | ICD-10-CM

## 2011-11-11 DIAGNOSIS — I4891 Unspecified atrial fibrillation: Secondary | ICD-10-CM

## 2011-11-11 LAB — POCT INR: INR: 2.8

## 2011-11-12 ENCOUNTER — Other Ambulatory Visit: Payer: Self-pay

## 2011-11-12 NOTE — Telephone Encounter (Signed)
He has been on it for a long time and a long time cardiac patient. I am going to forward to Dr. Lovena Le, his cardiologist, who I believe usually fills his amiodarone.

## 2011-11-12 NOTE — Telephone Encounter (Signed)
Pt request refill Amiodarone to [facility]. A very high med warning appeared when refilling Amiodarone; Amiodarone may increase anticoagulant effect; bleeding may occur. Please advise.

## 2011-11-16 ENCOUNTER — Ambulatory Visit (INDEPENDENT_AMBULATORY_CARE_PROVIDER_SITE_OTHER): Payer: Medicare Other | Admitting: *Deleted

## 2011-11-16 ENCOUNTER — Encounter: Payer: Self-pay | Admitting: Internal Medicine

## 2011-11-16 DIAGNOSIS — I495 Sick sinus syndrome: Secondary | ICD-10-CM

## 2011-11-16 LAB — PACEMAKER DEVICE OBSERVATION
AL AMPLITUDE: 1.5954 mv
AL IMPEDENCE PM: 408 Ohm
BATTERY VOLTAGE: 2.97 V
RV LEAD AMPLITUDE: 3.047 mv
RV LEAD IMPEDENCE PM: 480 Ohm
VENTRICULAR PACING PM: 0.09

## 2011-11-16 NOTE — Progress Notes (Signed)
PPM check 

## 2011-11-17 ENCOUNTER — Ambulatory Visit (INDEPENDENT_AMBULATORY_CARE_PROVIDER_SITE_OTHER): Payer: Medicare Other

## 2011-11-17 DIAGNOSIS — E538 Deficiency of other specified B group vitamins: Secondary | ICD-10-CM

## 2011-11-17 MED ORDER — CYANOCOBALAMIN 1000 MCG/ML IJ SOLN
1000.0000 ug | Freq: Once | INTRAMUSCULAR | Status: AC
  Administered 2011-11-17: 1000 ug via INTRAMUSCULAR

## 2011-11-23 ENCOUNTER — Other Ambulatory Visit: Payer: Self-pay | Admitting: *Deleted

## 2011-11-23 MED ORDER — AMIODARONE HCL 200 MG PO TABS: 200.0000 mg | ORAL_TABLET | Freq: Every day | ORAL | Status: DC

## 2011-12-16 DIAGNOSIS — L570 Actinic keratosis: Secondary | ICD-10-CM | POA: Diagnosis not present

## 2011-12-16 DIAGNOSIS — Z872 Personal history of diseases of the skin and subcutaneous tissue: Secondary | ICD-10-CM | POA: Diagnosis not present

## 2011-12-16 DIAGNOSIS — Z85828 Personal history of other malignant neoplasm of skin: Secondary | ICD-10-CM | POA: Diagnosis not present

## 2011-12-18 ENCOUNTER — Ambulatory Visit (INDEPENDENT_AMBULATORY_CARE_PROVIDER_SITE_OTHER): Payer: Medicare Other

## 2011-12-18 DIAGNOSIS — E538 Deficiency of other specified B group vitamins: Secondary | ICD-10-CM | POA: Diagnosis not present

## 2011-12-18 MED ORDER — CYANOCOBALAMIN 1000 MCG/ML IJ SOLN
1000.0000 ug | Freq: Once | INTRAMUSCULAR | Status: AC
  Administered 2011-12-18: 1000 ug via INTRAMUSCULAR

## 2011-12-23 ENCOUNTER — Ambulatory Visit (INDEPENDENT_AMBULATORY_CARE_PROVIDER_SITE_OTHER): Payer: Medicare Other

## 2011-12-23 DIAGNOSIS — I4891 Unspecified atrial fibrillation: Secondary | ICD-10-CM

## 2011-12-23 DIAGNOSIS — Z7901 Long term (current) use of anticoagulants: Secondary | ICD-10-CM | POA: Diagnosis not present

## 2011-12-28 NOTE — Telephone Encounter (Signed)
Dr Lovena Le has this been taken care of?

## 2012-01-10 MED ORDER — AMIODARONE HCL 200 MG PO TABS: ORAL_TABLET | ORAL | Status: DC

## 2012-01-19 ENCOUNTER — Ambulatory Visit (INDEPENDENT_AMBULATORY_CARE_PROVIDER_SITE_OTHER): Payer: Medicare Other | Admitting: *Deleted

## 2012-01-19 DIAGNOSIS — E538 Deficiency of other specified B group vitamins: Secondary | ICD-10-CM

## 2012-01-19 MED ORDER — CYANOCOBALAMIN 1000 MCG/ML IJ SOLN
1000.0000 ug | Freq: Once | INTRAMUSCULAR | Status: AC
  Administered 2012-01-19: 1000 ug via INTRAMUSCULAR

## 2012-01-27 DIAGNOSIS — H4010X0 Unspecified open-angle glaucoma, stage unspecified: Secondary | ICD-10-CM | POA: Diagnosis not present

## 2012-01-29 ENCOUNTER — Other Ambulatory Visit: Payer: Self-pay

## 2012-01-29 MED ORDER — LEVOTHYROXINE SODIUM 100 MCG PO TABS: 100.0000 ug | ORAL_TABLET | Freq: Every day | ORAL | Status: DC

## 2012-01-29 NOTE — Telephone Encounter (Signed)
Pt request refill levothyroxine 100 mcg to [facility]. Pt notified done.

## 2012-02-03 ENCOUNTER — Ambulatory Visit (INDEPENDENT_AMBULATORY_CARE_PROVIDER_SITE_OTHER): Payer: Medicare Other

## 2012-02-03 DIAGNOSIS — Z7901 Long term (current) use of anticoagulants: Secondary | ICD-10-CM

## 2012-02-03 DIAGNOSIS — I4891 Unspecified atrial fibrillation: Secondary | ICD-10-CM

## 2012-02-19 ENCOUNTER — Ambulatory Visit (INDEPENDENT_AMBULATORY_CARE_PROVIDER_SITE_OTHER): Payer: Medicare Other | Admitting: *Deleted

## 2012-02-19 ENCOUNTER — Telehealth: Payer: Self-pay

## 2012-02-19 DIAGNOSIS — E538 Deficiency of other specified B group vitamins: Secondary | ICD-10-CM | POA: Diagnosis not present

## 2012-02-19 MED ORDER — CYANOCOBALAMIN 1000 MCG/ML IJ SOLN
1000.0000 ug | Freq: Once | INTRAMUSCULAR | Status: AC
  Administered 2012-02-19: 1000 ug via INTRAMUSCULAR

## 2012-02-19 NOTE — Telephone Encounter (Signed)
Pt wants to know when Dr [name] [name] wants to see pt again for f/u and will pt needs lab appt also.Please advise. Pt last seen 10/07/11 medicare wellness exam.

## 2012-02-19 NOTE — Telephone Encounter (Signed)
Mid January or so fine

## 2012-02-22 NOTE — Telephone Encounter (Signed)
Patient advised and transferred up front to schedule appt

## 2012-03-16 ENCOUNTER — Ambulatory Visit (INDEPENDENT_AMBULATORY_CARE_PROVIDER_SITE_OTHER): Payer: Medicare Other

## 2012-03-16 DIAGNOSIS — I4891 Unspecified atrial fibrillation: Secondary | ICD-10-CM

## 2012-03-16 DIAGNOSIS — Z7901 Long term (current) use of anticoagulants: Secondary | ICD-10-CM

## 2012-03-16 LAB — POCT INR: INR: 2.9

## 2012-03-22 ENCOUNTER — Ambulatory Visit: Payer: Medicare Other

## 2012-03-24 ENCOUNTER — Ambulatory Visit (INDEPENDENT_AMBULATORY_CARE_PROVIDER_SITE_OTHER): Payer: Medicare Other | Admitting: *Deleted

## 2012-03-24 DIAGNOSIS — E538 Deficiency of other specified B group vitamins: Secondary | ICD-10-CM | POA: Diagnosis not present

## 2012-03-24 MED ORDER — CYANOCOBALAMIN 1000 MCG/ML IJ SOLN
1000.0000 ug | Freq: Once | INTRAMUSCULAR | Status: AC
  Administered 2012-03-24: 1000 ug via INTRAMUSCULAR

## 2012-04-06 ENCOUNTER — Other Ambulatory Visit (INDEPENDENT_AMBULATORY_CARE_PROVIDER_SITE_OTHER): Payer: Medicare Other

## 2012-04-06 ENCOUNTER — Other Ambulatory Visit: Payer: Self-pay

## 2012-04-06 ENCOUNTER — Other Ambulatory Visit: Payer: Medicare Other

## 2012-04-06 DIAGNOSIS — Z79899 Other long term (current) drug therapy: Secondary | ICD-10-CM | POA: Diagnosis not present

## 2012-04-06 DIAGNOSIS — E039 Hypothyroidism, unspecified: Secondary | ICD-10-CM

## 2012-04-06 DIAGNOSIS — E785 Hyperlipidemia, unspecified: Secondary | ICD-10-CM

## 2012-04-06 LAB — HEPATIC FUNCTION PANEL
Alkaline Phosphatase: 103 U/L (ref 39–117)
Bilirubin, Direct: 0.1 mg/dL (ref 0.0–0.3)
Total Protein: 7 g/dL (ref 6.0–8.3)

## 2012-04-06 LAB — BASIC METABOLIC PANEL
CO2: 28 mEq/L (ref 19–32)
Calcium: 9 mg/dL (ref 8.4–10.5)
Creatinine, Ser: 1.2 mg/dL (ref 0.4–1.5)
GFR: 58.61 mL/min — ABNORMAL LOW (ref >60.00)
Sodium: 141 mEq/L (ref 135–145)

## 2012-04-06 LAB — CBC WITH DIFFERENTIAL/PLATELET
Basophils Absolute: 0 10*3/uL (ref 0.0–0.1)
Eosinophils Absolute: 0.2 10*3/uL (ref 0.0–0.7)
HCT: 39.1 % (ref 39.0–52.0)
Hemoglobin: 12.9 g/dL — ABNORMAL LOW (ref 13.0–17.0)
Lymphs Abs: 1.3 10*3/uL (ref 0.7–4.0)
MCHC: 33.1 g/dL (ref 30.0–36.0)
MCV: 98.4 fl (ref 78.0–100.0)
Monocytes Absolute: 0.5 10*3/uL (ref 0.1–1.0)
Monocytes Relative: 7.5 % (ref 3.0–12.0)
Neutro Abs: 4.2 10*3/uL (ref 1.4–7.7)
Platelets: 230 10*3/uL (ref 150.0–400.0)
RDW: 14 % (ref 11.5–14.6)

## 2012-04-06 LAB — LIPID PANEL
Cholesterol: 149 mg/dL (ref 0–200)
HDL: 73.7 mg/dL (ref >39.00)
Total CHOL/HDL Ratio: 2
Triglycerides: 51 mg/dL (ref 0.0–149.0)

## 2012-04-06 MED ORDER — AMLODIPINE BESYLATE 2.5 MG PO TABS: 2.5000 mg | ORAL_TABLET | Freq: Every day | ORAL | Status: DC

## 2012-04-06 MED ORDER — PRAVASTATIN SODIUM 40 MG PO TABS: 40.0000 mg | ORAL_TABLET | Freq: Every day | ORAL | Status: DC

## 2012-04-06 NOTE — Telephone Encounter (Signed)
Pt left note requesting refill pravastatin and amlodipine to [facility]; pt notified refilled # 90 on each.

## 2012-04-13 ENCOUNTER — Ambulatory Visit (INDEPENDENT_AMBULATORY_CARE_PROVIDER_SITE_OTHER): Payer: Medicare Other | Admitting: Family Medicine

## 2012-04-13 ENCOUNTER — Encounter: Payer: Self-pay | Admitting: Family Medicine

## 2012-04-13 VITALS — BP 120/82 | HR 87 | Temp 97.4°F | Ht 71.0 in | Wt 166.0 lb

## 2012-04-13 DIAGNOSIS — E785 Hyperlipidemia, unspecified: Secondary | ICD-10-CM

## 2012-04-13 DIAGNOSIS — E039 Hypothyroidism, unspecified: Secondary | ICD-10-CM | POA: Diagnosis not present

## 2012-04-13 DIAGNOSIS — R222 Localized swelling, mass and lump, trunk: Secondary | ICD-10-CM | POA: Diagnosis not present

## 2012-04-13 DIAGNOSIS — I10 Essential (primary) hypertension: Secondary | ICD-10-CM

## 2012-04-13 MED ORDER — PANTOPRAZOLE SODIUM 40 MG PO TBEC: 40.0000 mg | DELAYED_RELEASE_TABLET | Freq: Every day | ORAL | Status: DC

## 2012-04-13 MED ORDER — TORSEMIDE 20 MG PO TABS: 20.0000 mg | ORAL_TABLET | ORAL | Status: DC | PRN

## 2012-04-13 NOTE — Progress Notes (Signed)
Therapist, music at [facility] [place] [address] Phone: [phone] Fax: [phone]  Date:  04/13/2012   Name:  [name] [name]   DOB:  [birth year]   MRN:  [id number] Gender: male Age: 79 y.o.  PCP:  Owens Loffler, MD  Evaluating MD: Owens Loffler, MD   Chief Complaint: Follow-up   History of Present Illness:  [name] [name] is a 79 y.o. pleasant patient who presents with the following:  6 month f/u:  Thyroid looks good BP normal  Thyroid: No symptoms. Labs reviewed. Denies cold / heat intolerance, dry skin, hair loss. No goiter.  Lab Results  Component Value Date   TSH 3.60 04/06/2012   HTN: Tolerating all medications without side effects Stable and at goal No CP, no sob. No HA.  BP Readings from Last 3 Encounters:  04/13/12 120/82  10/07/11 120/62  04/21/11 123456    Basic Metabolic Panel:    Component Value Date/Time   NA 141 04/06/2012 0906   K 4.5 04/06/2012 0906   CL 111 04/06/2012 0906   CO2 28 04/06/2012 0906   BUN 17 04/06/2012 0906   CREATININE 1.2 04/06/2012 0906   GLUCOSE 107* 04/06/2012 0906   CALCIUM 9.0 04/06/2012 0906   Lipids: Doing well, stable. Tolerating meds fine with no SE. Panel reviewed with patient.  Lipids:    Component Value Date/Time   CHOL 149 04/06/2012 0906   TRIG 51.0 04/06/2012 0906   HDL 73.70 04/06/2012 0906   VLDL 10.2 04/06/2012 0906   CHOLHDL 2 04/06/2012 0906    Lab Results  Component Value Date   ALT 27 04/06/2012   AST 31 04/06/2012   ALKPHOS 103 04/06/2012   BILITOT 0.9 04/06/2012   Area of L anterior chest wall has enlarged. Nontender to palpation. No history of smoking. Mother with a history of breast cancer.   08/2009 showed [place] increased color doppler flow, evidence of increased blood flow but no cyst  Patient Active Problem List  Diagnosis  . HYPOTHYROIDISM  . B12 DEFICIENCY  . HYPERLIPIDEMIA  . GLAUCOMA  . HYPERTENSION, ESSENTIAL NOS  . PAROXYSMAL ATRIAL FIBRILLATION  . GERD  . PEPTIC  ULCER DISEASE  . HERN UNS SITE ABD CAV W/O MENTION OBST/GANGREN  . RADIATION PROCTITIS  . DIVERTICULOSIS, COLON  . PROSTATE CANCER, HX OF  . PACEMAKER, PERMANENT  . IRON DEFICIENCY  . Encounter for long-term (current) use of anticoagulants  . Sinoatrial node dysfunction    Past Medical History  Diagnosis Date  . GI hemorrhage   . Radiation proctitis   . 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 Paroxysmal atrial fibrillation     s/p failed ablation  . Cardiac pacemaker in situ 07/2005    symptomatic bradycardia  . Hyperlipidemia   . Hypertension   . GERD (gastroesophageal reflux disease)   . Unspecified glaucoma(365.9)   . Vitamin B12 deficiency   . Peptic ulcer, unspecified site, unspecified as acute or chronic, without mention of hemorrhage, perforation, or obstruction 1975  . Personal history of malignant neoplasm of prostate 12/2000    5 weeks radiation, radiation seeds  . Diverticulosis of colon (without mention of hemorrhage)   . Unspecified hypothyroidism   . 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 Sinoatrial node dysfunction     Past Surgical History  Procedure Date  . Hernia repair 1994, 2001, 2003    s/p drainage and complications, 123456, [id number] mesh  removal  . Insert / replace / remove pacemaker 07/2005    symptomatic bradycardia  . Cardiac electrophysiology mapping and ablation 2001, 2003    failure  . Cataract extraction   . Cardiac catheterization     History  Substance Use Topics  . Smoking status: Never Smoker   . Smokeless tobacco: Never Used  . Alcohol Use: No    Family History  Problem Relation Age of Onset  . Breast cancer Mother   . Bone cancer Father   . Alcohol abuse    . Coronary artery disease    . Dementia    . Diabetes      Allergies  Allergen Reactions  . Penicillins     Medication list has been reviewed and updated.  Outpatient Prescriptions Prior to Visit  Medication Sig Dispense Refill  .  alendronate (FOSAMAX) 70 MG tablet Take 1 tablet (70 mg total) by mouth every 7 (seven) days. Take with a full glass of water on an empty stomach.  12 tablet  3  . amiodarone (PACERONE) 200 MG tablet 1 tablet po daily except Sat. And Sunday, where 1/2 tab po is taken  78 tablet  3  . amLODipine (NORVASC) 2.5 MG tablet Take 1 tablet (2.5 mg total) by mouth daily.  90 tablet  0  . Cyanocobalamin (VITAMIN B-12 IJ) Inject 1,000 mcg/mL as directed every 30 (thirty) days.        . dorzolamide-timolol (COSOPT) 22.3-6.8 MG/ML ophthalmic solution Place 1 drop into both eyes 2 (two) times daily.        . ferrous sulfate 325 (65 FE) MG EC tablet Take 325 mg by mouth daily with breakfast.        . levothyroxine (SYNTHROID, LEVOTHROID) 100 MCG tablet Take 1 tablet (100 mcg total) by mouth daily.  90 tablet  2  . Multiple Vitamin (MULTIVITAMIN) tablet Take 1 tablet by mouth daily.        . pravastatin (PRAVACHOL) 40 MG tablet Take 1 tablet (40 mg total) by mouth daily.  90 tablet  0  . warfarin (COUMADIN) 4 MG tablet Take 1 tablet (4 mg total) by mouth as directed.  90 tablet  3  . Wheat Dextrin (BENEFIBER DRINK MIX PO) Take by mouth as directed.        . [DISCONTINUED] pantoprazole (PROTONIX) 40 MG tablet Take 1 tablet (40 mg total) by mouth daily.  90 tablet  3  . [DISCONTINUED] torsemide (DEMADEX) 20 MG tablet Take 20 mg by mouth as needed.       . [DISCONTINUED] amiodarone (PACERONE) 200 MG tablet Take 1 tablet (200 mg total) by mouth daily. Saturday and Sunday take 1/2 tablet by mouth  90 tablet  3   Last reviewed on 04/13/2012  8:12 AM by Cathlean Cower, CMA  Review of Systems:   GEN: No acute illnesses, no fevers, chills. GI: No n/v/d, eating normally Pulm: No SOB Interactive and getting along well at home.  Otherwise, ROS is as per the HPI.   Physical Examination: BP 120/82  Pulse 87  Temp 97.4 F (36.3 C) (Oral)  Ht 5\' 11"  (1.803 m)  Wt 166 lb (75.297 kg)  BMI 23.15 kg/m2  Ideal Body  Weight: Weight in (lb) to have BMI = 25: 178.9    GEN: WDWN, NAD, Non-toxic, A & O x 3 HEENT: Atraumatic, Normocephalic. Neck supple. No masses, No LAD. Ears and Nose: No external deformity. CV: RRR, No M/G/R. No JVD. No thrill. No  extra heart sounds. PULM: CTA B, no wheezes, crackles, rhonchi. No retractions. No resp. distress. No accessory muscle use. EXTR: No c/c/e NEURO Normal gait.  PSYCH: Normally interactive. Conversant. Not depressed or anxious appearing.  Calm demeanor.    Assessment and Plan:  1. Chest mass  CT Chest W Contrast  2. HYPERTENSION, ESSENTIAL NOS    3. HYPERLIPIDEMIA    4. HYPOTHYROIDISM     HTN, Lipids, thyroid are all stable, keep on same medication  > 2 year history of enlarging chest wall mass with a relatively normal [place], but increased color flow suggestive of increased blood flow. No pain, FH of breast cancer, no smoking history. CT with contrast to rule out potential neoplastic disease and bony mets to ribs.  Orders Today:  Orders Placed This Encounter  Procedures  . CT Chest W Contrast    Standing Status: Future     Number of Occurrences:      Standing Expiration Date: 06/11/2013    Order Specific Question:  Reason for exam:    Answer:  mass, left chest, scan with contrast, rule out potential neoplastic disease    Order Specific Question:  Preferred imaging location?    Answer:  External    Updated Medication List: (Includes new medications, updates to list, dose adjustments) Meds ordered this encounter  Medications  . clindamycin (CLEOCIN) 300 MG capsule    Sig: Take by mouth. 2 tablets by mouth 1 hour prior to dental procedure  . torsemide (DEMADEX) 20 MG tablet    Sig: Take 1 tablet (20 mg total) by mouth as needed.    Dispense:  90 tablet    Refill:  3  . pantoprazole (PROTONIX) 40 MG tablet    Sig: Take 1 tablet (40 mg total) by mouth daily.    Dispense:  90 tablet    Refill:  3    Medications Discontinued: Medications Discontinued  During This Encounter  Medication Reason  . amiodarone (PACERONE) 200 MG tablet Error  . torsemide (DEMADEX) 20 MG tablet Reorder  . pantoprazole (PROTONIX) 40 MG tablet Reorder     Owens Loffler, MD

## 2012-04-13 NOTE — Patient Instructions (Addendum)
REFERRAL: GO THE THE FRONT ROOM AT THE ENTRANCE OF OUR CLINIC, NEAR CHECK IN. ASK FOR MARION. SHE WILL HELP YOU SET UP YOUR REFERRAL. DATE: TIME:   F/u 6 months

## 2012-04-19 ENCOUNTER — Ambulatory Visit: Payer: Self-pay | Admitting: Family Medicine

## 2012-04-19 ENCOUNTER — Encounter: Payer: Self-pay | Admitting: Family Medicine

## 2012-04-19 DIAGNOSIS — R222 Localized swelling, mass and lump, trunk: Secondary | ICD-10-CM | POA: Diagnosis not present

## 2012-04-26 ENCOUNTER — Ambulatory Visit (INDEPENDENT_AMBULATORY_CARE_PROVIDER_SITE_OTHER): Payer: Medicare Other | Admitting: Internal Medicine

## 2012-04-26 ENCOUNTER — Ambulatory Visit (INDEPENDENT_AMBULATORY_CARE_PROVIDER_SITE_OTHER): Payer: Medicare Other

## 2012-04-26 ENCOUNTER — Encounter: Payer: Self-pay | Admitting: Internal Medicine

## 2012-04-26 VITALS — BP 92/60 | HR 64 | Ht 71.0 in | Wt 164.8 lb

## 2012-04-26 DIAGNOSIS — I10 Essential (primary) hypertension: Secondary | ICD-10-CM | POA: Diagnosis not present

## 2012-04-26 DIAGNOSIS — I4891 Unspecified atrial fibrillation: Secondary | ICD-10-CM

## 2012-04-26 DIAGNOSIS — Z950 Presence of cardiac pacemaker: Secondary | ICD-10-CM

## 2012-04-26 DIAGNOSIS — Z7901 Long term (current) use of anticoagulants: Secondary | ICD-10-CM | POA: Diagnosis not present

## 2012-04-26 LAB — PACEMAKER DEVICE OBSERVATION
AL AMPLITUDE: 1.5954 mv
AL IMPEDENCE PM: 408 Ohm
AL THRESHOLD: 0.5 V
BATTERY VOLTAGE: 2.96 V
RV LEAD AMPLITUDE: 4.7398 mv

## 2012-04-26 LAB — POCT INR: INR: 4.3

## 2012-04-26 NOTE — Patient Instructions (Addendum)
Your physician wants you to follow-up in: 6 months with device clinic and 1 year with Dr. Lovena Le. You will receive a reminder letter in the mail two months in advance. If you don't receive a letter, please call our office to schedule the follow-up appointment.  Your physician has recommended you make the following change in your medication -stop amlodipine

## 2012-04-26 NOTE — Assessment & Plan Note (Signed)
He is maintaining sinus rhythm very nicely. He will continue his low dose of amiodarone, and his anticoagulation.

## 2012-04-26 NOTE — Progress Notes (Signed)
HPI [name] returns today for followup. He is a very pleasant 79 year old man with a history of atrial fibrillation, symptomatic bradycardia, status post permanent pacemaker insertion, hypertension, and most recently, symptoms of orthostasis. He denies chest pain, shortness of breath, but does have mild peripheral edema. Allergies  Allergen Reactions  . Penicillins      Current Outpatient Prescriptions  Medication Sig Dispense Refill  . alendronate (FOSAMAX) 70 MG tablet Take 1 tablet (70 mg total) by mouth every 7 (seven) days. Take with a full glass of water on an empty stomach.  12 tablet  3  . amiodarone (PACERONE) 200 MG tablet 1 tablet po daily except Sat. And Sunday, where 1/2 tab po is taken  78 tablet  3  . clindamycin (CLEOCIN) 300 MG capsule Take by mouth. 2 tablets by mouth 1 hour prior to dental procedure      . Cyanocobalamin (VITAMIN B-12 IJ) Inject 1,000 mcg/mL as directed every 30 (thirty) days.        . dorzolamide-timolol (COSOPT) 22.3-6.8 MG/ML ophthalmic solution Place 1 drop into both eyes 2 (two) times daily.        . ferrous sulfate 325 (65 FE) MG EC tablet Take 325 mg by mouth daily with breakfast.        . levothyroxine (SYNTHROID, LEVOTHROID) 100 MCG tablet Take 1 tablet (100 mcg total) by mouth daily.  90 tablet  2  . Multiple Vitamin (MULTIVITAMIN) tablet Take 1 tablet by mouth daily.        . pantoprazole (PROTONIX) 40 MG tablet Take 1 tablet (40 mg total) by mouth daily.  90 tablet  3  . pravastatin (PRAVACHOL) 40 MG tablet Take 1 tablet (40 mg total) by mouth daily.  90 tablet  0  . torsemide (DEMADEX) 20 MG tablet Take 1 tablet (20 mg total) by mouth as needed.  90 tablet  3  . warfarin (COUMADIN) 4 MG tablet Take 1 tablet (4 mg total) by mouth as directed.  90 tablet  3  . Wheat Dextrin (BENEFIBER DRINK MIX PO) Take by mouth as directed.           Past Medical History  Diagnosis Date  . GI hemorrhage   . Radiation proctitis   . 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 Paroxysmal atrial fibrillation     s/p failed ablation  . Cardiac pacemaker in situ 07/2005    symptomatic bradycardia  . Hyperlipidemia   . Hypertension   . GERD (gastroesophageal reflux disease)   . Unspecified glaucoma(365.9)   . Vitamin B12 deficiency   . Peptic ulcer, unspecified site, unspecified as acute or chronic, without mention of hemorrhage, perforation, or obstruction 1975  . Personal history of malignant neoplasm of prostate 12/2000    5 weeks radiation, radiation seeds  . Diverticulosis of colon (without mention of hemorrhage)   . Unspecified hypothyroidism   . 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 Sinoatrial node dysfunction     ROS:   All systems reviewed and negative except as noted in the HPI.   Past Surgical History  Procedure Date  . Hernia repair 1994, 2001, 2003    s/p drainage and complications, 123456, [id number] mesh removal  . Insert / replace / remove pacemaker 07/2005    symptomatic bradycardia  . Cardiac electrophysiology mapping and ablation 2001, 2003    failure  . Cataract extraction   . Cardiac catheterization      Family  History  Problem Relation Age of Onset  . Breast cancer Mother   . Bone cancer Father   . Alcohol abuse    . Coronary artery disease    . Dementia    . Diabetes       History   Social History  . Marital Status: Widowed    Spouse Name: N/A    Number of Children: N/A  . Years of Education: N/A   Occupational History  . Not on file.   Social History Main Topics  . Smoking status: Never Smoker   . Smokeless tobacco: Never Used  . Alcohol Use: No  . Drug Use: No  . Sexually Active:    Other Topics Concern  . Not on file   Social History Narrative  . No narrative on file     BP 92/60  Pulse 64  Ht 5\' 11"  (1.803 m)  Wt 164 lb 12 oz (74.73 kg)  BMI 22.98 kg/m2  Physical Exam:  Well appearing elderly man, NAD HEENT: Unremarkable Neck:  7 cm JVD, no  thyromegally Lungs:  Clear with no wheezes, rales, or rhonchi. HEART:  Regular rate rhythm, no murmurs, no rubs, no clicks Abd:  soft, positive bowel sounds, no organomegally, no rebound, no guarding Ext:  2 plus pulses, trace peripheral edema, no cyanosis, no clubbing Skin:  No rashes no nodules Neuro:  CN II through XII intact, motor grossly intact  EKG sinus rhythm with atrial pacing  DEVICE  Normal device function.  See PaceArt for details.   Assess/Plan:

## 2012-04-26 NOTE — Assessment & Plan Note (Signed)
His blood pressure is actually low today. With his symptoms of orthostasis, I've asked the patient to stop taking Norvasc altogether.

## 2012-04-26 NOTE — Assessment & Plan Note (Signed)
His Medtronic dual-chamber pacemaker appears to be working normally. We'll plan to recheck in several months.

## 2012-04-28 ENCOUNTER — Ambulatory Visit (INDEPENDENT_AMBULATORY_CARE_PROVIDER_SITE_OTHER): Payer: Medicare Other | Admitting: *Deleted

## 2012-04-28 DIAGNOSIS — E538 Deficiency of other specified B group vitamins: Secondary | ICD-10-CM | POA: Diagnosis not present

## 2012-04-28 MED ORDER — CYANOCOBALAMIN 1000 MCG/ML IJ SOLN
1000.0000 ug | Freq: Once | INTRAMUSCULAR | Status: AC
  Administered 2012-04-28: 1000 ug via INTRAMUSCULAR

## 2012-05-04 ENCOUNTER — Ambulatory Visit (INDEPENDENT_AMBULATORY_CARE_PROVIDER_SITE_OTHER): Payer: Medicare Other

## 2012-05-04 DIAGNOSIS — Z7901 Long term (current) use of anticoagulants: Secondary | ICD-10-CM

## 2012-05-04 DIAGNOSIS — I4891 Unspecified atrial fibrillation: Secondary | ICD-10-CM | POA: Diagnosis not present

## 2012-05-18 ENCOUNTER — Ambulatory Visit (INDEPENDENT_AMBULATORY_CARE_PROVIDER_SITE_OTHER): Payer: Medicare Other

## 2012-05-18 DIAGNOSIS — I4891 Unspecified atrial fibrillation: Secondary | ICD-10-CM | POA: Diagnosis not present

## 2012-05-18 DIAGNOSIS — Z7901 Long term (current) use of anticoagulants: Secondary | ICD-10-CM

## 2012-05-30 ENCOUNTER — Ambulatory Visit: Payer: Medicare Other | Admitting: Family Medicine

## 2012-05-31 ENCOUNTER — Ambulatory Visit (INDEPENDENT_AMBULATORY_CARE_PROVIDER_SITE_OTHER): Payer: Medicare Other | Admitting: *Deleted

## 2012-05-31 DIAGNOSIS — E538 Deficiency of other specified B group vitamins: Secondary | ICD-10-CM

## 2012-05-31 MED ORDER — CYANOCOBALAMIN 1000 MCG/ML IJ SOLN
1000.0000 ug | Freq: Once | INTRAMUSCULAR | Status: AC
  Administered 2012-05-31: 1000 ug via INTRAMUSCULAR

## 2012-06-08 ENCOUNTER — Ambulatory Visit (INDEPENDENT_AMBULATORY_CARE_PROVIDER_SITE_OTHER): Payer: Medicare Other

## 2012-06-08 DIAGNOSIS — I4891 Unspecified atrial fibrillation: Secondary | ICD-10-CM | POA: Diagnosis not present

## 2012-06-08 DIAGNOSIS — Z7901 Long term (current) use of anticoagulants: Secondary | ICD-10-CM

## 2012-06-08 LAB — POCT INR: INR: 2

## 2012-06-10 ENCOUNTER — Other Ambulatory Visit: Payer: Self-pay | Admitting: *Deleted

## 2012-06-10 MED ORDER — ALENDRONATE SODIUM 70 MG PO TABS: 70.0000 mg | ORAL_TABLET | ORAL | Status: DC

## 2012-07-01 ENCOUNTER — Ambulatory Visit (INDEPENDENT_AMBULATORY_CARE_PROVIDER_SITE_OTHER): Payer: Medicare Other | Admitting: *Deleted

## 2012-07-01 DIAGNOSIS — E538 Deficiency of other specified B group vitamins: Secondary | ICD-10-CM | POA: Diagnosis not present

## 2012-07-01 MED ORDER — CYANOCOBALAMIN 1000 MCG/ML IJ SOLN
1000.0000 ug | Freq: Once | INTRAMUSCULAR | Status: AC
  Administered 2012-07-01: 1000 ug via INTRAMUSCULAR

## 2012-07-06 ENCOUNTER — Ambulatory Visit (INDEPENDENT_AMBULATORY_CARE_PROVIDER_SITE_OTHER): Payer: Medicare Other

## 2012-07-06 DIAGNOSIS — Z7901 Long term (current) use of anticoagulants: Secondary | ICD-10-CM

## 2012-07-06 DIAGNOSIS — I4891 Unspecified atrial fibrillation: Secondary | ICD-10-CM | POA: Diagnosis not present

## 2012-07-06 LAB — POCT INR: INR: 1.9

## 2012-07-19 ENCOUNTER — Telehealth: Payer: Self-pay

## 2012-07-19 MED ORDER — PRAVASTATIN SODIUM 40 MG PO TABS: 40.0000 mg | ORAL_TABLET | Freq: Every day | ORAL | Status: DC

## 2012-07-19 NOTE — Telephone Encounter (Signed)
Pt left note requesting refill pravastatin to [facility]; left v/m for pt to call back that refill was done.

## 2012-07-19 NOTE — Telephone Encounter (Signed)
Pt notified refill done

## 2012-07-22 DIAGNOSIS — R3129 Other microscopic hematuria: Secondary | ICD-10-CM | POA: Diagnosis not present

## 2012-07-22 DIAGNOSIS — C61 Malignant neoplasm of prostate: Secondary | ICD-10-CM | POA: Diagnosis not present

## 2012-07-25 DIAGNOSIS — N281 Cyst of kidney, acquired: Secondary | ICD-10-CM | POA: Diagnosis not present

## 2012-07-25 DIAGNOSIS — Q619 Cystic kidney disease, unspecified: Secondary | ICD-10-CM | POA: Diagnosis not present

## 2012-07-25 DIAGNOSIS — R3129 Other microscopic hematuria: Secondary | ICD-10-CM | POA: Diagnosis not present

## 2012-07-27 DIAGNOSIS — H4010X0 Unspecified open-angle glaucoma, stage unspecified: Secondary | ICD-10-CM | POA: Diagnosis not present

## 2012-08-01 ENCOUNTER — Ambulatory Visit: Payer: Medicare Other | Admitting: Family Medicine

## 2012-08-03 ENCOUNTER — Ambulatory Visit (INDEPENDENT_AMBULATORY_CARE_PROVIDER_SITE_OTHER): Payer: Medicare Other

## 2012-08-03 DIAGNOSIS — Z7901 Long term (current) use of anticoagulants: Secondary | ICD-10-CM

## 2012-08-03 DIAGNOSIS — I4891 Unspecified atrial fibrillation: Secondary | ICD-10-CM

## 2012-08-24 ENCOUNTER — Ambulatory Visit (INDEPENDENT_AMBULATORY_CARE_PROVIDER_SITE_OTHER): Payer: Medicare Other

## 2012-08-24 DIAGNOSIS — Z7901 Long term (current) use of anticoagulants: Secondary | ICD-10-CM

## 2012-08-24 DIAGNOSIS — I4891 Unspecified atrial fibrillation: Secondary | ICD-10-CM

## 2012-08-24 LAB — POCT INR: INR: 2.9

## 2012-09-21 ENCOUNTER — Ambulatory Visit (INDEPENDENT_AMBULATORY_CARE_PROVIDER_SITE_OTHER): Payer: Medicare Other

## 2012-09-21 DIAGNOSIS — Z7901 Long term (current) use of anticoagulants: Secondary | ICD-10-CM | POA: Diagnosis not present

## 2012-09-21 DIAGNOSIS — I4891 Unspecified atrial fibrillation: Secondary | ICD-10-CM | POA: Diagnosis not present

## 2012-09-21 LAB — POCT INR: INR: 2.7

## 2012-09-23 ENCOUNTER — Ambulatory Visit (INDEPENDENT_AMBULATORY_CARE_PROVIDER_SITE_OTHER): Payer: Medicare Other | Admitting: *Deleted

## 2012-09-23 DIAGNOSIS — E538 Deficiency of other specified B group vitamins: Secondary | ICD-10-CM

## 2012-09-23 MED ORDER — CYANOCOBALAMIN 1000 MCG/ML IJ SOLN
1000.0000 ug | Freq: Once | INTRAMUSCULAR | Status: AC
  Administered 2012-09-23: 1000 ug via INTRAMUSCULAR

## 2012-10-17 ENCOUNTER — Ambulatory Visit (INDEPENDENT_AMBULATORY_CARE_PROVIDER_SITE_OTHER): Payer: Medicare Other

## 2012-10-17 ENCOUNTER — Ambulatory Visit (INDEPENDENT_AMBULATORY_CARE_PROVIDER_SITE_OTHER): Payer: Medicare Other | Admitting: *Deleted

## 2012-10-17 DIAGNOSIS — Z7901 Long term (current) use of anticoagulants: Secondary | ICD-10-CM | POA: Diagnosis not present

## 2012-10-17 DIAGNOSIS — I4891 Unspecified atrial fibrillation: Secondary | ICD-10-CM | POA: Diagnosis not present

## 2012-10-17 DIAGNOSIS — I495 Sick sinus syndrome: Secondary | ICD-10-CM | POA: Diagnosis not present

## 2012-10-17 LAB — PACEMAKER DEVICE OBSERVATION
AL IMPEDENCE PM: 456 Ohm
AL THRESHOLD: 1 V
ATRIAL PACING PM: 99.9
RV LEAD IMPEDENCE PM: 672 Ohm
RV LEAD THRESHOLD: 1 V
VENTRICULAR PACING PM: 0.17

## 2012-10-17 LAB — POCT INR: INR: 2.8

## 2012-10-17 NOTE — Progress Notes (Signed)
PPM check in office. 

## 2012-10-25 ENCOUNTER — Ambulatory Visit (INDEPENDENT_AMBULATORY_CARE_PROVIDER_SITE_OTHER): Payer: Medicare Other | Admitting: *Deleted

## 2012-10-25 DIAGNOSIS — E538 Deficiency of other specified B group vitamins: Secondary | ICD-10-CM

## 2012-10-25 MED ORDER — AMIODARONE HCL 200 MG PO TABS: 200.0000 mg | ORAL_TABLET | Freq: Every day | ORAL | Status: DC

## 2012-10-25 MED ORDER — CYANOCOBALAMIN 1000 MCG/ML IJ SOLN
1000.0000 ug | Freq: Once | INTRAMUSCULAR | Status: AC
  Administered 2012-10-25: 1000 ug via INTRAMUSCULAR

## 2012-11-08 ENCOUNTER — Other Ambulatory Visit (INDEPENDENT_AMBULATORY_CARE_PROVIDER_SITE_OTHER): Payer: Medicare Other

## 2012-11-08 ENCOUNTER — Telehealth: Payer: Self-pay | Admitting: Family Medicine

## 2012-11-08 DIAGNOSIS — I10 Essential (primary) hypertension: Secondary | ICD-10-CM

## 2012-11-08 DIAGNOSIS — E785 Hyperlipidemia, unspecified: Secondary | ICD-10-CM

## 2012-11-08 LAB — COMPREHENSIVE METABOLIC PANEL
AST: 27 U/L (ref 0–37)
Albumin: 3.5 g/dL (ref 3.5–5.2)
Alkaline Phosphatase: 82 U/L (ref 39–117)
BUN: 16 mg/dL (ref 6–23)
Calcium: 9 mg/dL (ref 8.4–10.5)
Chloride: 107 mEq/L (ref 96–112)
Glucose, Bld: 86 mg/dL (ref 70–99)
Potassium: 4.7 mEq/L (ref 3.5–5.1)
Sodium: 139 mEq/L (ref 135–145)
Total Protein: 6.3 g/dL (ref 6.0–8.3)

## 2012-11-08 LAB — LIPID PANEL
LDL Cholesterol: 65 mg/dL (ref 0–99)
Total CHOL/HDL Ratio: 2

## 2012-11-08 NOTE — Telephone Encounter (Signed)
Message copied by Jinny Sanders on Tue Nov 08, 2012  8:29 AM ------      Message from: Ellamae Sia      Created: Mon Nov 07, 2012  2:21 PM      Regarding: lab orders for Tuesday, 8.12.14       Labs for a 6 month checkup, Dr Lillie Fragmin pt. ------

## 2012-11-14 ENCOUNTER — Encounter: Payer: Self-pay | Admitting: Family Medicine

## 2012-11-14 ENCOUNTER — Ambulatory Visit (INDEPENDENT_AMBULATORY_CARE_PROVIDER_SITE_OTHER): Payer: Medicare Other | Admitting: Family Medicine

## 2012-11-14 VITALS — BP 114/70 | HR 66 | Temp 98.4°F | Ht 71.0 in | Wt 161.5 lb

## 2012-11-14 DIAGNOSIS — E785 Hyperlipidemia, unspecified: Secondary | ICD-10-CM

## 2012-11-14 DIAGNOSIS — E039 Hypothyroidism, unspecified: Secondary | ICD-10-CM | POA: Diagnosis not present

## 2012-11-14 DIAGNOSIS — I10 Essential (primary) hypertension: Secondary | ICD-10-CM | POA: Diagnosis not present

## 2012-11-14 DIAGNOSIS — K219 Gastro-esophageal reflux disease without esophagitis: Secondary | ICD-10-CM

## 2012-11-14 NOTE — Progress Notes (Signed)
Therapist, music at [facility] [place] [address] Phone: [phone] Fax: [phone]  Date:  11/14/2012   Name:  [name] [name]   DOB:  [birth year]   MRN:  [id number] Gender: male Age: 79 y.o.  Primary Physician:  Owens Loffler, MD  Evaluating MD: Owens Loffler, MD   Chief Complaint: Follow-up   History of Present Illness:  [name] [name] is a 79 y.o. pleasant patient who presents with the following:  F/u HTN:  Stable at this point on current medications. No side effects.  Lipids: Currently at goal. He is on Pravachol.  Thyroid: TSH was normal and stable 6 months ago, and he is currently on 100 mcg of Synthroid.  Sees Dr. Salome Holmes Dr. Phillip Heal in [place].   Results for orders placed in visit on 11/08/12  COMPREHENSIVE METABOLIC PANEL      Result Value Range   Sodium 139  135 - 145 mEq/L   Potassium 4.7  3.5 - 5.1 mEq/L   Chloride 107  96 - 112 mEq/L   CO2 27  19 - 32 mEq/L   Glucose, Bld 86  70 - 99 mg/dL   BUN 16  6 - 23 mg/dL   Creatinine, Ser 1.3  0.4 - 1.5 mg/dL   Total Bilirubin 1.1  0.3 - 1.2 mg/dL   Alkaline Phosphatase 82  39 - 117 U/L   AST 27  0 - 37 U/L   ALT 20  0 - 53 U/L   Total Protein 6.3  6.0 - 8.3 g/dL   Albumin 3.5  3.5 - 5.2 g/dL   Calcium 9.0  8.4 - 10.5 mg/dL   GFR 56.42 (*) >60.00 mL/min  LIPID PANEL      Result Value Range   Cholesterol 141  0 - 200 mg/dL   Triglycerides 57.0  0.0 - 149.0 mg/dL   HDL 64.90  >39.00 mg/dL   VLDL 11.4  0.0 - 40.0 mg/dL   LDL Cholesterol 65  0 - 99 mg/dL   Total CHOL/HDL Ratio 2       Patient Active Problem List   Diagnosis Date Noted  . Sinoatrial node dysfunction   . Encounter for long-term (current) use of anticoagulants 06/24/2010  . IRON DEFICIENCY 04/17/2010  . GLAUCOMA 08/07/2008  . GERD 08/07/2008  . PACEMAKER, PERMANENT 08/07/2008  . B12 DEFICIENCY 07/19/2008  . PEPTIC ULCER DISEASE 07/18/2008  . RADIATION PROCTITIS 07/18/2008  . DIVERTICULOSIS, COLON  07/18/2008  . PROSTATE CANCER, HX OF 07/18/2008  . HYPERTENSION, ESSENTIAL NOS 05/09/2008  . HYPERLIPIDEMIA 02/06/2008  . PAROXYSMAL ATRIAL FIBRILLATION 02/06/2008  . HYPOTHYROIDISM 12/01/2007  . HERN UNS SITE ABD CAV W/O MENTION OBST/GANGREN 12/01/2007    Past Medical History  Diagnosis Date  . GI hemorrhage   . Radiation proctitis   . 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 Paroxysmal atrial fibrillation     s/p failed ablation  . Cardiac pacemaker in situ 07/2005    symptomatic bradycardia  . Hyperlipidemia   . Hypertension   . GERD (gastroesophageal reflux disease)   . Unspecified glaucoma(365.9)   . Vitamin B12 deficiency   . Peptic ulcer, unspecified site, unspecified as acute or chronic, without mention of hemorrhage, perforation, or obstruction 1975  . Personal history of malignant neoplasm of prostate 12/2000    5 weeks radiation, radiation seeds  . Diverticulosis of colon (without mention of hemorrhage)   . Unspecified hypothyroidism   .  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 Sinoatrial node dysfunction     Past Surgical History  Procedure Laterality Date  . Hernia repair  1994, 2001, 2003    s/p drainage and complications, 123456, [id number] mesh removal  . Insert / replace / remove pacemaker  07/2005    symptomatic bradycardia  . Cardiac electrophysiology mapping and ablation  2001, 2003    failure  . Cataract extraction    . Cardiac catheterization      History   Social History  . Marital Status: Widowed    Spouse Name: N/A    Number of Children: N/A  . Years of Education: N/A   Occupational History  . Not on file.   Social History Main Topics  . Smoking status: Never Smoker   . Smokeless tobacco: Never Used  . Alcohol Use: No  . Drug Use: No  . Sexual Activity: Not on file   Other Topics Concern  . Not on file   Social History Narrative  . No narrative on file    Family History  Problem Relation Age of Onset    . Breast cancer Mother   . Bone cancer Father   . Alcohol abuse    . Coronary artery disease    . Dementia    . Diabetes      Allergies  Allergen Reactions  . Penicillins     Medication list has been reviewed and updated.  Outpatient Prescriptions Prior to Visit  Medication Sig Dispense Refill  . alendronate (FOSAMAX) 70 MG tablet Take 1 tablet (70 mg total) by mouth every 7 (seven) days. Take with a full glass of water on an empty stomach.  12 tablet  3  . amiodarone (PACERONE) 200 MG tablet Take 1 tablet (200 mg total) by mouth daily. Take 1/2 tab on Sat & Sun  78 tablet  3  . clindamycin (CLEOCIN) 300 MG capsule Take by mouth. 2 tablets by mouth 1 hour prior to dental procedure      . Cyanocobalamin (VITAMIN B-12 IJ) Inject 1,000 mcg/mL as directed every 30 (thirty) days.        . dorzolamide-timolol (COSOPT) 22.3-6.8 MG/ML ophthalmic solution Place 1 drop into both eyes 2 (two) times daily.        . ferrous sulfate 325 (65 FE) MG EC tablet Take 325 mg by mouth daily with breakfast.        . levothyroxine (SYNTHROID, LEVOTHROID) 100 MCG tablet Take 1 tablet (100 mcg total) by mouth daily.  90 tablet  2  . Multiple Vitamin (MULTIVITAMIN) tablet Take 1 tablet by mouth daily.        . pantoprazole (PROTONIX) 40 MG tablet Take 1 tablet (40 mg total) by mouth daily.  90 tablet  3  . torsemide (DEMADEX) 20 MG tablet Take 1 tablet (20 mg total) by mouth as needed.  90 tablet  3  . warfarin (COUMADIN) 4 MG tablet Take 1 tablet (4 mg total) by mouth as directed.  90 tablet  3  . Wheat Dextrin (BENEFIBER DRINK MIX PO) Take by mouth as directed.        . pravastatin (PRAVACHOL) 40 MG tablet Take 1 tablet (40 mg total) by mouth daily.  90 tablet  1   No facility-administered medications prior to visit.    Review of Systems:   GEN: No acute illnesses, no fevers, chills. GI: No n/v/d, eating normally Pulm: No SOB Interactive and getting along well at home.  Otherwise, ROS is as per  the HPI.   Physical Examination: BP 114/70  Pulse 66  Temp(Src) 98.4 F (36.9 C) (Oral)  Ht 5\' 11"  (1.803 m)  Wt 161 lb 8 oz (73.256 kg)  BMI 22.53 kg/m2  Ideal Body Weight: Weight in (lb) to have BMI = 25: 178.9   GEN: WDWN, NAD, Non-toxic, A & O x 3 HEENT: Atraumatic, Normocephalic. Neck supple. No masses, No LAD. Ears and Nose: No external deformity. CV: RRR, No M/G/R. No JVD. No thrill. No extra heart sounds. PULM: CTA B, no wheezes, crackles, rhonchi. No retractions. No resp. distress. No accessory muscle use. EXTR: No c/c/e NEURO Normal gait.  PSYCH: Normally interactive. Conversant. Not depressed or anxious appearing.  Calm demeanor.    Assessment and Plan:  HYPERLIPIDEMIA  HYPOTHYROIDISM  HYPERTENSION, ESSENTIAL NOS  GERD  All of the above are stable. Continue current regimen for lipids. He is also on amiodarone and Coumadin. Continue with Synthroid. Continue Protonix.  Orders Today:  No orders of the defined types were placed in this encounter.    Updated Medication List: (Includes new medications, updates to list, dose adjustments) No orders of the defined types were placed in this encounter.    Medications Discontinued: There are no discontinued medications.    Signed, [name]. Fernie Grimm, MD 11/14/2012 11:56 AM

## 2012-11-14 NOTE — Patient Instructions (Addendum)
F/u 1 year for medicare CPX

## 2012-11-29 ENCOUNTER — Ambulatory Visit: Payer: Medicare Other

## 2012-11-29 ENCOUNTER — Encounter: Payer: Self-pay | Admitting: Internal Medicine

## 2012-11-30 ENCOUNTER — Ambulatory Visit (INDEPENDENT_AMBULATORY_CARE_PROVIDER_SITE_OTHER): Payer: Medicare Other

## 2012-11-30 ENCOUNTER — Ambulatory Visit (INDEPENDENT_AMBULATORY_CARE_PROVIDER_SITE_OTHER): Payer: Medicare Other | Admitting: *Deleted

## 2012-11-30 DIAGNOSIS — Z7901 Long term (current) use of anticoagulants: Secondary | ICD-10-CM

## 2012-11-30 DIAGNOSIS — E538 Deficiency of other specified B group vitamins: Secondary | ICD-10-CM

## 2012-11-30 DIAGNOSIS — I4891 Unspecified atrial fibrillation: Secondary | ICD-10-CM | POA: Diagnosis not present

## 2012-11-30 MED ORDER — CYANOCOBALAMIN 1000 MCG/ML IJ SOLN
1000.0000 ug | Freq: Once | INTRAMUSCULAR | Status: AC
  Administered 2012-11-30: 1000 ug via INTRAMUSCULAR

## 2012-12-12 ENCOUNTER — Ambulatory Visit (INDEPENDENT_AMBULATORY_CARE_PROVIDER_SITE_OTHER): Payer: Medicare Other | Admitting: *Deleted

## 2012-12-12 DIAGNOSIS — Z7901 Long term (current) use of anticoagulants: Secondary | ICD-10-CM | POA: Diagnosis not present

## 2012-12-12 DIAGNOSIS — I4891 Unspecified atrial fibrillation: Secondary | ICD-10-CM | POA: Diagnosis not present

## 2012-12-12 LAB — POCT INR: INR: 3.6

## 2012-12-20 DIAGNOSIS — Z23 Encounter for immunization: Secondary | ICD-10-CM | POA: Diagnosis not present

## 2012-12-26 ENCOUNTER — Telehealth: Payer: Self-pay

## 2012-12-26 ENCOUNTER — Ambulatory Visit (INDEPENDENT_AMBULATORY_CARE_PROVIDER_SITE_OTHER): Payer: Medicare Other

## 2012-12-26 ENCOUNTER — Other Ambulatory Visit: Payer: Self-pay

## 2012-12-26 DIAGNOSIS — Z5181 Encounter for therapeutic drug level monitoring: Secondary | ICD-10-CM | POA: Diagnosis not present

## 2012-12-26 DIAGNOSIS — I4891 Unspecified atrial fibrillation: Secondary | ICD-10-CM

## 2012-12-26 DIAGNOSIS — Z7901 Long term (current) use of anticoagulants: Secondary | ICD-10-CM

## 2012-12-26 LAB — PROTIME-INR
INR: 2.7 — AB (ref <=1.1)
INR: 2.7 — ABNORMAL HIGH (ref 0.8–1.2)
Prothrombin Time: 28.1 s — ABNORMAL HIGH (ref 9.1–12.0)

## 2012-12-26 LAB — POCT INR: INR: 2.7

## 2012-12-26 NOTE — Telephone Encounter (Signed)
Calling regarding pt INR. Pt has procedure tomorrow morning . Please call

## 2012-12-26 NOTE — Telephone Encounter (Signed)
Left message informing pt that I faxed lab results of PT/INR to [facility], Attn: Izora Gala or Janett Billow at [phone].

## 2012-12-27 ENCOUNTER — Other Ambulatory Visit (INDEPENDENT_AMBULATORY_CARE_PROVIDER_SITE_OTHER): Payer: BC Managed Care – PPO

## 2012-12-27 DIAGNOSIS — Z7901 Long term (current) use of anticoagulants: Secondary | ICD-10-CM | POA: Diagnosis not present

## 2012-12-27 DIAGNOSIS — I4891 Unspecified atrial fibrillation: Secondary | ICD-10-CM | POA: Diagnosis not present

## 2013-01-03 ENCOUNTER — Ambulatory Visit (INDEPENDENT_AMBULATORY_CARE_PROVIDER_SITE_OTHER): Payer: Medicare Other | Admitting: *Deleted

## 2013-01-03 DIAGNOSIS — E538 Deficiency of other specified B group vitamins: Secondary | ICD-10-CM | POA: Diagnosis not present

## 2013-01-03 MED ORDER — CYANOCOBALAMIN 1000 MCG/ML IJ SOLN
1000.0000 ug | Freq: Once | INTRAMUSCULAR | Status: AC
  Administered 2013-01-03: 1000 ug via INTRAMUSCULAR

## 2013-01-11 ENCOUNTER — Ambulatory Visit (INDEPENDENT_AMBULATORY_CARE_PROVIDER_SITE_OTHER): Payer: Medicare Other | Admitting: General Practice

## 2013-01-11 DIAGNOSIS — Z7901 Long term (current) use of anticoagulants: Secondary | ICD-10-CM | POA: Diagnosis not present

## 2013-01-11 DIAGNOSIS — I4891 Unspecified atrial fibrillation: Secondary | ICD-10-CM | POA: Diagnosis not present

## 2013-01-11 LAB — POCT INR: INR: 3

## 2013-01-24 DIAGNOSIS — Z872 Personal history of diseases of the skin and subcutaneous tissue: Secondary | ICD-10-CM | POA: Diagnosis not present

## 2013-01-24 DIAGNOSIS — D0430 Carcinoma in situ of skin of unspecified part of face: Secondary | ICD-10-CM | POA: Diagnosis not present

## 2013-01-24 DIAGNOSIS — L570 Actinic keratosis: Secondary | ICD-10-CM | POA: Diagnosis not present

## 2013-01-24 DIAGNOSIS — Z85828 Personal history of other malignant neoplasm of skin: Secondary | ICD-10-CM | POA: Diagnosis not present

## 2013-01-24 DIAGNOSIS — D0439 Carcinoma in situ of skin of other parts of face: Secondary | ICD-10-CM | POA: Diagnosis not present

## 2013-01-24 DIAGNOSIS — Z1283 Encounter for screening for malignant neoplasm of skin: Secondary | ICD-10-CM | POA: Diagnosis not present

## 2013-01-24 DIAGNOSIS — C44319 Basal cell carcinoma of skin of other parts of face: Secondary | ICD-10-CM | POA: Diagnosis not present

## 2013-01-24 DIAGNOSIS — D485 Neoplasm of uncertain behavior of skin: Secondary | ICD-10-CM | POA: Diagnosis not present

## 2013-01-27 ENCOUNTER — Telehealth: Payer: Self-pay

## 2013-01-27 MED ORDER — WARFARIN SODIUM 4 MG PO TABS: 4.0000 mg | ORAL_TABLET | ORAL | Status: DC

## 2013-01-27 MED ORDER — PRAVASTATIN SODIUM 40 MG PO TABS: 40.0000 mg | ORAL_TABLET | Freq: Every day | ORAL | Status: DC

## 2013-01-27 NOTE — Telephone Encounter (Signed)
Pt left note requesting refill pravastatin to [facility]; refill done. Pt request warfarin 4 mg to [facility] but has not had refill done since 04/02/2011 and left message for pt to cb.

## 2013-01-27 NOTE — Telephone Encounter (Signed)
Await PCPs recommendations.

## 2013-01-27 NOTE — Telephone Encounter (Signed)
i refilled 

## 2013-01-27 NOTE — Telephone Encounter (Signed)
This is what he takes, he takes 1/2 tab usually, so would make sense. He has great f/u with coumadin clinic.

## 2013-01-30 NOTE — Telephone Encounter (Signed)
Pt said he got message to cb about refill; advised pt pravastatin and warfarin sent to [facility]. Pt voiced understanding and was appreciative.

## 2013-01-31 DIAGNOSIS — H4010X0 Unspecified open-angle glaucoma, stage unspecified: Secondary | ICD-10-CM | POA: Diagnosis not present

## 2013-02-03 ENCOUNTER — Ambulatory Visit (INDEPENDENT_AMBULATORY_CARE_PROVIDER_SITE_OTHER): Payer: Medicare Other

## 2013-02-03 DIAGNOSIS — E538 Deficiency of other specified B group vitamins: Secondary | ICD-10-CM

## 2013-02-03 MED ORDER — CYANOCOBALAMIN 1000 MCG/ML IJ SOLN
1000.0000 ug | Freq: Once | INTRAMUSCULAR | Status: AC
  Administered 2013-02-03: 1000 ug via INTRAMUSCULAR

## 2013-02-08 ENCOUNTER — Ambulatory Visit (INDEPENDENT_AMBULATORY_CARE_PROVIDER_SITE_OTHER): Payer: Medicare Other | Admitting: *Deleted

## 2013-02-08 DIAGNOSIS — Z7901 Long term (current) use of anticoagulants: Secondary | ICD-10-CM | POA: Diagnosis not present

## 2013-02-08 DIAGNOSIS — I4891 Unspecified atrial fibrillation: Secondary | ICD-10-CM | POA: Diagnosis not present

## 2013-02-22 ENCOUNTER — Ambulatory Visit (INDEPENDENT_AMBULATORY_CARE_PROVIDER_SITE_OTHER): Payer: Medicare Other | Admitting: General Practice

## 2013-02-22 DIAGNOSIS — Z7901 Long term (current) use of anticoagulants: Secondary | ICD-10-CM | POA: Diagnosis not present

## 2013-02-22 DIAGNOSIS — I4891 Unspecified atrial fibrillation: Secondary | ICD-10-CM

## 2013-03-09 ENCOUNTER — Ambulatory Visit (INDEPENDENT_AMBULATORY_CARE_PROVIDER_SITE_OTHER): Payer: Medicare Other | Admitting: *Deleted

## 2013-03-09 DIAGNOSIS — E538 Deficiency of other specified B group vitamins: Secondary | ICD-10-CM | POA: Diagnosis not present

## 2013-03-09 MED ORDER — CYANOCOBALAMIN 1000 MCG/ML IJ SOLN
1000.0000 ug | Freq: Once | INTRAMUSCULAR | Status: AC
  Administered 2013-03-09: 1000 ug via INTRAMUSCULAR

## 2013-03-13 DIAGNOSIS — L570 Actinic keratosis: Secondary | ICD-10-CM | POA: Diagnosis not present

## 2013-03-13 DIAGNOSIS — C44320 Squamous cell carcinoma of skin of unspecified parts of face: Secondary | ICD-10-CM | POA: Diagnosis not present

## 2013-03-13 DIAGNOSIS — C4441 Basal cell carcinoma of skin of scalp and neck: Secondary | ICD-10-CM | POA: Diagnosis not present

## 2013-03-13 DIAGNOSIS — C4491 Basal cell carcinoma of skin, unspecified: Secondary | ICD-10-CM | POA: Diagnosis not present

## 2013-03-16 ENCOUNTER — Ambulatory Visit (INDEPENDENT_AMBULATORY_CARE_PROVIDER_SITE_OTHER): Payer: Medicare Other | Admitting: Pharmacist

## 2013-03-16 DIAGNOSIS — I4891 Unspecified atrial fibrillation: Secondary | ICD-10-CM | POA: Diagnosis not present

## 2013-03-16 DIAGNOSIS — Z7901 Long term (current) use of anticoagulants: Secondary | ICD-10-CM

## 2013-04-11 ENCOUNTER — Ambulatory Visit (INDEPENDENT_AMBULATORY_CARE_PROVIDER_SITE_OTHER): Payer: Medicare Other | Admitting: *Deleted

## 2013-04-11 DIAGNOSIS — E538 Deficiency of other specified B group vitamins: Secondary | ICD-10-CM | POA: Diagnosis not present

## 2013-04-11 MED ORDER — CYANOCOBALAMIN 1000 MCG/ML IJ SOLN
1000.0000 ug | Freq: Once | INTRAMUSCULAR | Status: AC
  Administered 2013-04-11: 1000 ug via INTRAMUSCULAR

## 2013-04-18 ENCOUNTER — Ambulatory Visit (INDEPENDENT_AMBULATORY_CARE_PROVIDER_SITE_OTHER): Payer: Medicare Other | Admitting: Pharmacist

## 2013-04-18 ENCOUNTER — Encounter: Payer: Self-pay | Admitting: Internal Medicine

## 2013-04-18 ENCOUNTER — Ambulatory Visit (INDEPENDENT_AMBULATORY_CARE_PROVIDER_SITE_OTHER): Payer: Medicare Other | Admitting: Internal Medicine

## 2013-04-18 VITALS — BP 118/71 | HR 65 | Ht 71.0 in | Wt 160.5 lb

## 2013-04-18 DIAGNOSIS — I4891 Unspecified atrial fibrillation: Secondary | ICD-10-CM

## 2013-04-18 DIAGNOSIS — Z7901 Long term (current) use of anticoagulants: Secondary | ICD-10-CM

## 2013-04-18 DIAGNOSIS — I495 Sick sinus syndrome: Secondary | ICD-10-CM

## 2013-04-18 DIAGNOSIS — Z950 Presence of cardiac pacemaker: Secondary | ICD-10-CM | POA: Diagnosis not present

## 2013-04-18 LAB — MDC_IDC_ENUM_SESS_TYPE_INCLINIC
Brady Statistic AP VP Percent: 0.11 %
Brady Statistic AP VS Percent: 99.68 %
Brady Statistic AS VS Percent: 0.22 %
Brady Statistic RA Percent Paced: 99.78 %
Brady Statistic RV Percent Paced: 0.11 %
Lead Channel Impedance Value: 392 Ohm
Lead Channel Impedance Value: 608 Ohm
Lead Channel Pacing Threshold Amplitude: 1.5 V
Lead Channel Pacing Threshold Pulse Width: 0.4 ms
Lead Channel Sensing Intrinsic Amplitude: 1.5523
Lead Channel Setting Pacing Amplitude: 2 V
Lead Channel Setting Pacing Pulse Width: 0.4 ms
Lead Channel Setting Sensing Sensitivity: 0.9 mV
MDC IDC MSMT BATTERY VOLTAGE: 2.95 V
MDC IDC MSMT LEADCHNL RA PACING THRESHOLD AMPLITUDE: 1 V
MDC IDC MSMT LEADCHNL RA PACING THRESHOLD PULSEWIDTH: 0.4 ms
MDC IDC MSMT LEADCHNL RV SENSING INTR AMPL: 3.047
MDC IDC SESS DTM: 20150120101116
MDC IDC SET LEADCHNL RV PACING AMPLITUDE: 2.5 V
MDC IDC SET ZONE DETECTION INTERVAL: 350 ms
MDC IDC STAT BRADY AS VP PERCENT: 0 %
Zone Setting Detection Interval: 400 ms

## 2013-04-18 LAB — POCT INR: INR: 2.7

## 2013-04-18 MED ORDER — AMIODARONE HCL 200 MG PO TABS: 100.0000 mg | ORAL_TABLET | Freq: Every day | ORAL | Status: DC

## 2013-04-18 NOTE — Progress Notes (Signed)
Patient Care Team: Owens Loffler, MD as PCP - General   HPI  [name] [name] is a 80 y.o. male Seen in followup for pacemaker implanted for tachybradycardia syndrome the context of paroxysmal atrial fibrillation prior failed ablation. He takes amiodarone.His hypertension and last year was noted to have orthostasis. Last TSH is 2012 and liver function tests were 1/14  The patient denies chest pain, shortness of breath, nocturnal dyspnea, orthopnea or peripheral edema.  There have been no palpitations, lightheadedness or syncope.    Past Medical History  Diagnosis Date  . GI hemorrhage   . Radiation proctitis   . 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 Paroxysmal atrial fibrillation     s/p failed ablation  . Cardiac pacemaker in situ 07/2005    symptomatic bradycardia  . Hyperlipidemia   . Hypertension   . GERD (gastroesophageal reflux disease)   . Unspecified glaucoma   . Vitamin B12 deficiency   . Peptic ulcer, unspecified site, unspecified as acute or chronic, without mention of hemorrhage, perforation, or obstruction 1975  . Personal history of malignant neoplasm of prostate 12/2000    5 weeks radiation, radiation seeds  . Diverticulosis of colon (without mention of hemorrhage)   . Unspecified hypothyroidism   . 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 Sinoatrial node dysfunction     Past Surgical History  Procedure Laterality Date  . Hernia repair  1994, 2001, 2003    s/p drainage and complications, 123456, [id number] mesh removal  . Insert / replace / remove pacemaker  07/2005    symptomatic bradycardia  . Cardiac electrophysiology mapping and ablation  2001, 2003    failure  . Cataract extraction    . Cardiac catheterization    . Tooth extraction    . Nose surgery      cancer removed     Current Outpatient Prescriptions  Medication Sig Dispense Refill  . alendronate (FOSAMAX) 70 MG tablet Take 1 tablet (70 mg total) by mouth  every 7 (seven) days. Take with a full glass of water on an empty stomach.  12 tablet  3  . amiodarone (PACERONE) 200 MG tablet Take 1 tablet (200 mg total) by mouth daily. Take 1/2 tab on Sat & Sun  78 tablet  3  . clindamycin (CLEOCIN) 300 MG capsule Take by mouth. 2 tablets by mouth 1 hour prior to dental procedure      . Cyanocobalamin (VITAMIN B-12 IJ) Inject 1,000 mcg/mL as directed every 30 (thirty) days.        . dorzolamide-timolol (COSOPT) 22.3-6.8 MG/ML ophthalmic solution Place 1 drop into both eyes 2 (two) times daily.        . ferrous sulfate 325 (65 FE) MG EC tablet Take 325 mg by mouth daily with breakfast.        . levothyroxine (SYNTHROID, LEVOTHROID) 100 MCG tablet Take 1 tablet (100 mcg total) by mouth daily.  90 tablet  2  . Multiple Vitamin (MULTIVITAMIN) tablet Take 1 tablet by mouth daily.        . pantoprazole (PROTONIX) 40 MG tablet Take 1 tablet (40 mg total) by mouth daily.  90 tablet  3  . pravastatin (PRAVACHOL) 40 MG tablet Take 1 tablet (40 mg total) by mouth daily.  90 tablet  1  . torsemide (DEMADEX) 20 MG tablet Take 1 tablet (20 mg total) by mouth as needed.  90 tablet  3  .  warfarin (COUMADIN) 4 MG tablet Take 1 tablet (4 mg total) by mouth as directed.  90 tablet  3  . Wheat Dextrin (BENEFIBER DRINK MIX PO) Take by mouth as directed.         No current facility-administered medications for this visit.    Allergies  Allergen Reactions  . Penicillins     Review of Systems negative except from HPI and PMH  Physical Exam BP 118/71  Pulse 65  Ht 5\' 11"  (1.803 m)  Wt 160 lb 8 oz (72.802 kg)  BMI 22.40 kg/m2 Well developed and nourished in no acute distress HENT normal Neck supple with JVP-flat Clear The patient's device was interrogated.  The information was reviewed. No changes were made in the programming.    Regular rate and rhythm,s1 diminished Abd-soft with active BS No Clubbing cyanosis edema Skin-warm and dry A & Oriented  Grossly normal  sensory and motor function     Assessment and  Plan  Atrial Fibrillation  Sinus node dysfunction  Pacemaker--MDT  Pt is stalble on anticoagulation and amiodarone.   iwll check labs today and decrease amio >>100 mg daily

## 2013-04-18 NOTE — Patient Instructions (Addendum)
Your physician has recommended you make the following change in your medication:  Amiodarone 100 mg daily    Your physician wants you to follow-up in: 6 months with Dr. Caryl Comes and [place] [facility].Marland Kitchen You will receive a reminder letter in the mail two months in advance. If you don't receive a letter, please call our office to schedule the follow-up appointment.  Your physician recommends that you have lab work today: TSH  Liver Panel

## 2013-04-19 LAB — HEPATIC FUNCTION PANEL
ALK PHOS: 102 IU/L (ref 39–117)
ALT: 21 IU/L (ref 0–44)
AST: 31 IU/L (ref 0–40)
Albumin: 3.7 g/dL (ref 3.5–4.7)
BILIRUBIN DIRECT: 0.13 mg/dL (ref 0.00–0.40)
BILIRUBIN TOTAL: 0.5 mg/dL (ref 0.0–1.2)
Total Protein: 5.8 g/dL — ABNORMAL LOW (ref 6.0–8.5)

## 2013-04-19 LAB — TSH: TSH: 3.56 u[IU]/mL (ref 0.450–4.500)

## 2013-05-16 ENCOUNTER — Ambulatory Visit: Payer: Medicare Other

## 2013-05-19 ENCOUNTER — Ambulatory Visit (INDEPENDENT_AMBULATORY_CARE_PROVIDER_SITE_OTHER): Payer: Medicare Other

## 2013-05-19 DIAGNOSIS — I4891 Unspecified atrial fibrillation: Secondary | ICD-10-CM

## 2013-05-19 DIAGNOSIS — Z7901 Long term (current) use of anticoagulants: Secondary | ICD-10-CM

## 2013-05-19 DIAGNOSIS — Z5181 Encounter for therapeutic drug level monitoring: Secondary | ICD-10-CM

## 2013-05-19 LAB — POCT INR: INR: 2.8

## 2013-05-24 ENCOUNTER — Other Ambulatory Visit: Payer: Self-pay

## 2013-05-24 MED ORDER — ALENDRONATE SODIUM 70 MG PO TABS: 70.0000 mg | ORAL_TABLET | ORAL | Status: DC

## 2013-05-24 NOTE — Telephone Encounter (Signed)
Pt left note requesting alendronate refill to [facility]. Done. Unable to reach pt by phone due to line remaining busy.

## 2013-05-30 ENCOUNTER — Ambulatory Visit (INDEPENDENT_AMBULATORY_CARE_PROVIDER_SITE_OTHER): Payer: Medicare Other

## 2013-05-30 DIAGNOSIS — E538 Deficiency of other specified B group vitamins: Secondary | ICD-10-CM | POA: Diagnosis not present

## 2013-05-30 MED ORDER — CYANOCOBALAMIN 1000 MCG/ML IJ SOLN
1000.0000 ug | Freq: Once | INTRAMUSCULAR | Status: AC
  Administered 2013-05-30: 1000 ug via INTRAMUSCULAR

## 2013-06-07 ENCOUNTER — Encounter: Payer: Self-pay | Admitting: Family Medicine

## 2013-06-07 ENCOUNTER — Ambulatory Visit (INDEPENDENT_AMBULATORY_CARE_PROVIDER_SITE_OTHER): Payer: Medicare Other | Admitting: Family Medicine

## 2013-06-07 ENCOUNTER — Ambulatory Visit (INDEPENDENT_AMBULATORY_CARE_PROVIDER_SITE_OTHER)
Admission: RE | Admit: 2013-06-07 | Discharge: 2013-06-07 | Disposition: A | Discharge status: Home / Self Care | Payer: Medicare Other | Source: Ambulatory Visit | Attending: Family Medicine | Admitting: Family Medicine

## 2013-06-07 VITALS — BP 104/62 | HR 66 | Temp 97.6°F | Ht 71.0 in | Wt 157.2 lb

## 2013-06-07 DIAGNOSIS — M47812 Spondylosis without myelopathy or radiculopathy, cervical region: Secondary | ICD-10-CM

## 2013-06-07 DIAGNOSIS — M542 Cervicalgia: Secondary | ICD-10-CM

## 2013-06-07 DIAGNOSIS — M5030 Other cervical disc degeneration, unspecified cervical region: Secondary | ICD-10-CM | POA: Diagnosis not present

## 2013-06-07 DIAGNOSIS — R0989 Other specified symptoms and signs involving the circulatory and respiratory systems: Secondary | ICD-10-CM | POA: Diagnosis not present

## 2013-06-07 DIAGNOSIS — B369 Superficial mycosis, unspecified: Secondary | ICD-10-CM

## 2013-06-07 MED ORDER — DICLOFENAC SODIUM 1 % TD GEL: 4.0000 g | Freq: Four times a day (QID) | TRANSDERMAL | Status: DC

## 2013-06-07 NOTE — Patient Instructions (Addendum)
OVER THE COUNTER CLOTRIMAZOLE OR LAMISIL CREAM   REFERRALS TO SPECIALISTS, SPECIAL TESTS (MRI, CT, ULTRASOUNDS)  GO THE WAITING ROOM AND TELL CHECK IN YOU NEED HELP WITH A REFERRAL. Either MARION or LINDA will help you set it up.  If it is between 1-2 PM they may be at lunch.  After 5 PM, they will likely be at home.  They will call you, so please make sure the office has your correct phone number.  Referrals sometimes can be done same day if urgent, but others can take 2 or 3 days to get an appointment. Starting in 2015, some of the new Medicare insurance plans and [place] offered on the Exchange take longer for referrals. They have added additional paperwork and steps.  MRI's and CT's can take up to a week for the test. (Emergencies like strokes take precedence. I will tell you if you have an emergency.)   Specialist appointment times vary a great deal, mostly on the specialist's schedule and if they have openings. -- Our office tries to get you in as fast as possible. -- Some specialists have very long wait times. (Example. Dermatology. Usually months) -- If you have a true emergency like new cancer, we work to get you in ASAP.

## 2013-06-07 NOTE — Progress Notes (Signed)
Pre visit review using our clinic review tool, if applicable. No additional management support is needed unless otherwise documented below in the visit note. 

## 2013-06-07 NOTE — Progress Notes (Signed)
Date:  06/07/2013   Name:  [name] [name]   DOB:  1922/10/16   MRN:  [id number] Gender: male Age: 80 y.o.  Primary Physician:  Owens Loffler, MD   Chief Complaint: Neck and Shoulder Soreness, Extremity Weakness and Skin Irritation   Subjective:   History of Present Illness:  [name] [name] is a 80 y.o. pleasant patient who presents with the following:  Pain and soreness in r shoulder and neck, has used heating pad, ointment. Taken the right aid Tylenol, and has used some Voltaren gel. Has helped some, the voltaren gel. Will get some relief from not dropping shoulder and raising head.   Bothers more with sitting still. No significant prior neck history, surgery, or injury.  On coumadin.   L leg, "Falling out from under him", leg does not feel right. Not in knee, or hip. No LBP.  Edema, not atypical for him. Intermittent over years. Corrects itself with torsemide.  Foot fungus. Has tried ABX cream   Patient Active Problem List   Diagnosis Date Noted  . DDD (degenerative disc disease), cervical, Severe 06/09/2013  . Neck arthritis, Severe, multi-level 06/09/2013  . Encounter for therapeutic drug monitoring 05/19/2013  . Sinoatrial node dysfunction   . Encounter for long-term (current) use of anticoagulants 06/24/2010  . IRON DEFICIENCY 04/17/2010  . GLAUCOMA 08/07/2008  . GERD 08/07/2008  . PACEMAKER, PERMANENT 08/07/2008  . B12 DEFICIENCY 07/19/2008  . PEPTIC ULCER DISEASE 07/18/2008  . RADIATION PROCTITIS 07/18/2008  . DIVERTICULOSIS, COLON 07/18/2008  . PROSTATE CANCER, HX OF 07/18/2008  . HYPERTENSION, ESSENTIAL NOS 05/09/2008  . HYPERLIPIDEMIA 02/06/2008  . PAROXYSMAL ATRIAL FIBRILLATION 02/06/2008  . HYPOTHYROIDISM 12/01/2007  . HERN UNS SITE ABD CAV W/O MENTION OBST/GANGREN 12/01/2007    Past Medical History  Diagnosis Date  . GI hemorrhage   . Radiation proctitis   . 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 Paroxysmal atrial  fibrillation     s/p failed ablation  . Cardiac pacemaker in situ 07/2005    symptomatic bradycardia  . Hyperlipidemia   . Hypertension   . GERD (gastroesophageal reflux disease)   . Unspecified glaucoma   . Vitamin B12 deficiency   . Peptic ulcer, unspecified site, unspecified as acute or chronic, without mention of hemorrhage, perforation, or obstruction 1975  . Personal history of malignant neoplasm of prostate 12/2000    5 weeks radiation, radiation seeds  . Diverticulosis of colon (without mention of hemorrhage)   . Unspecified hypothyroidism   . 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 Sinoatrial node dysfunction   . Neck arthritis, Severe, multi-level 06/09/2013    Past Surgical History  Procedure Laterality Date  . Hernia repair  1994, 2001, 2003    s/p drainage and complications, 123456, [id number] mesh removal  . Insert / replace / remove pacemaker  07/2005    symptomatic bradycardia  . Cardiac electrophysiology mapping and ablation  2001, 2003    failure  . Cataract extraction    . Cardiac catheterization    . Tooth extraction    . Nose surgery      cancer removed     History   Social History  . Marital Status: Widowed    Spouse Name: N/A    Number of Children: N/A  . Years of Education: N/A   Occupational History  . Not on file.   Social History Main Topics  . Smoking status: Never Smoker   .  Smokeless tobacco: Never Used  . Alcohol Use: No  . Drug Use: No  . Sexual Activity: Not on file   Other Topics Concern  . Not on file   Social History Narrative  . No narrative on file    Family History  Problem Relation Age of Onset  . Breast cancer Mother   . Bone cancer Father   . Alcohol abuse    . Coronary artery disease    . Dementia    . Diabetes      Allergies  Allergen Reactions  . Penicillins     Medication list has been reviewed and updated.  Review of Systems:   GEN: No acute illnesses, no fevers, chills. GI: No n/v/d,  eating normally Pulm: No SOB edema Interactive and getting along well at home.  Otherwise, ROS is as per the HPI.  Objective:   Physical Examination: BP 104/62  Pulse 66  Temp(Src) 97.6 F (36.4 C) (Oral)  Ht 5\' 11"  (1.803 m)  Wt 157 lb 4 oz (71.328 kg)  BMI 21.94 kg/m2  Ideal Body Weight: Weight in (lb) to have BMI = 25: 178.9   GEN: WDWN, NAD, Non-toxic, Alert & Oriented x 3 HEENT: Atraumatic, Normocephalic.  Ears and Nose: No external deformity. EXTR: 1+ LE edema. Relatively decreased DP and PT pulses on the L. NEURO: Normal gait.  PSYCH: Normally interactive. Conversant. Not depressed or anxious appearing.  Calm demeanor.  Skin: B foot dermatitis  CERVICAL SPINE EXAM Range of motion: Flexion, extension, lateral bending, and rotation: 15% loss of motion all directions Pain with terminal motion: yes Spinous Processes: NT SCM: NT Upper paracervical muscles: mild tenderness Upper traps: NT C5-T1 intact, sensation and motor   Shoulder: B Inspection: No muscle wasting or winging Ecchymosis/edema: neg  AC joint, scapula, clavicle: NT Cervical spine: NT, full ROM Spurling's: neg Abduction: full, 5/5 Flexion: full, 5/5 IR, full, lift-off: 5/5 ER at neutral: full, 5/5 AC crossover and compression: neg Neer: neg Hawkins: neg Drop Test: neg Empty Can: neg Supraspinatus insertion: NT Bicipital groove: NT Speed's: neg Yergason's: neg Sulcus sign: neg Scapular dyskinesis: none C5-T1 intact Sensation intact Grip 5/5   Laboratory and Imaging Data: Dg Cervical Spine Complete  06/07/2013   CLINICAL DATA Pain.  EXAM CERVICAL SPINE  4+ VIEWS  COMPARISON CTA neck 11/18/2004.  FINDINGS Soft tissue structures are unremarkable. Cardiac pacer noted. Biapical pleural thickening again noted consistent with scarring. Prominent angulation deformity cervical spine noted with prominent endplate osteophytes. Angulation deformity most likely secondary to severe degenerative change  at C5-C6. Similar findings noted on prior CT.  IMPRESSION 1. Severe degenerative changes cervical spine. Prominent loss of normal cervical lordosis is most likely secondary to severity of DJD. Degenerative change projected severe C5-C6. Similar findings noted on CT of 11/18/2004. 2. Biapical pleural thickening consistent with scarring. Similar finding noted on prior CT .  SIGNATURE  Electronically Signed   By: Marcello Moores  Register   On: 06/07/2013 13:01    Assessment & Plan:   Neck pain on right side - Plan: DG Cervical Spine Complete, Ambulatory referral to Physical Therapy  Decreased pulses in feet - Plan: Ankle brachial index  DDD (degenerative disc disease), cervical, Severe  Neck arthritis  Fungal dermatitis  Severe multilevel DDD and spondyloarthropathy in essentially entire c-spine.  Challenge with coumadin use.  Voltaren gel has provided some relief.  PT may help and worth trying.  With unusual sounding leg pain and what seems to be decreased pulses, check ABI's.  Lotrimin or lamisil otc  New Prescriptions   DICLOFENAC SODIUM (VOLTAREN) 1 % GEL    Apply 4 g topically 4 (four) times daily.   Orders Placed This Encounter  Procedures  . DG Cervical Spine Complete  . Ambulatory referral to Physical Therapy  . Ankle brachial index   Signed,  Delbert Vu T. Jordyne Poehlman, MD, [place] [facility] [place] [address] Phone: [phone] Fax: [phone]  Patient Instructions  OVER THE COUNTER CLOTRIMAZOLE OR LAMISIL CREAM   REFERRALS TO SPECIALISTS, SPECIAL TESTS (MRI, CT, ULTRASOUNDS)  GO THE WAITING ROOM AND TELL CHECK IN YOU NEED HELP WITH A REFERRAL. Either MARION or LINDA will help you set it up.  If it is between 1-2 PM they may be at lunch.  After 5 PM, they will likely be at home.  They will call you, so please make sure the office has your correct phone number.  Referrals sometimes can be done same day if urgent,  but others can take 2 or 3 days to get an appointment. Starting in 2015, some of the new Medicare insurance plans and [place] offered on the Exchange take longer for referrals. They have added additional paperwork and steps.  MRI's and CT's can take up to a week for the test. (Emergencies like strokes take precedence. I will tell you if you have an emergency.)   Specialist appointment times vary a great deal, mostly on the specialist's schedule and if they have openings. -- Our office tries to get you in as fast as possible. -- Some specialists have very long wait times. (Example. Dermatology. Usually months) -- If you have a true emergency like new cancer, we work to get you in ASAP.     Patient's Medications  New Prescriptions   DICLOFENAC SODIUM (VOLTAREN) 1 % GEL    Apply 4 g topically 4 (four) times daily.  Previous Medications   ALENDRONATE (FOSAMAX) 70 MG TABLET    Take 1 tablet (70 mg total) by mouth every 7 (seven) days. Take with a full glass of water on an empty stomach.   AMIODARONE (PACERONE) 200 MG TABLET    Take 0.5 tablets (100 mg total) by mouth daily.   CLINDAMYCIN (CLEOCIN) 300 MG CAPSULE    Take by mouth. 2 tablets by mouth 1 hour prior to dental procedure   CYANOCOBALAMIN (VITAMIN B-12 IJ)    Inject 1,000 mcg/mL as directed every 30 (thirty) days.     DORZOLAMIDE-TIMOLOL (COSOPT) 22.3-6.8 MG/ML OPHTHALMIC SOLUTION    Place 1 drop into both eyes 2 (two) times daily.     FERROUS SULFATE 325 (65 FE) MG EC TABLET    Take 325 mg by mouth daily with breakfast.     LEVOTHYROXINE (SYNTHROID, LEVOTHROID) 100 MCG TABLET    Take 1 tablet (100 mcg total) by mouth daily.   MULTIPLE VITAMIN (MULTIVITAMIN) TABLET    Take 1 tablet by mouth daily.     PANTOPRAZOLE (PROTONIX) 40 MG TABLET    Take 1 tablet (40 mg total) by mouth daily.   PRAVASTATIN (PRAVACHOL) 40 MG TABLET    Take 1 tablet (40 mg total) by mouth daily.   TORSEMIDE (DEMADEX) 20 MG TABLET    Take 1  tablet (20 mg total) by mouth as needed.   WARFARIN (COUMADIN) 4 MG TABLET    Take 1 tablet (4 mg total) by mouth as directed.   WHEAT DEXTRIN ([place]  PO)    Take by mouth as directed.    Modified Medications   No medications on file  Discontinued Medications   No medications on file

## 2013-06-08 ENCOUNTER — Telehealth: Payer: Self-pay | Admitting: *Deleted

## 2013-06-08 NOTE — Telephone Encounter (Signed)
Received prior authorization request for Voltaren Gel 1%.  Prior authorization initiated on [email or web address].  Will await outcome.

## 2013-06-09 ENCOUNTER — Encounter: Payer: Self-pay | Admitting: Family Medicine

## 2013-06-09 DIAGNOSIS — M5030 Other cervical disc degeneration, unspecified cervical region: Secondary | ICD-10-CM | POA: Insufficient documentation

## 2013-06-09 DIAGNOSIS — M47812 Spondylosis without myelopathy or radiculopathy, cervical region: Secondary | ICD-10-CM

## 2013-06-09 HISTORY — DX: Spondylosis without myelopathy or radiculopathy, cervical region: M47.812

## 2013-06-12 NOTE — Telephone Encounter (Signed)
Patient is on coumadin. All oral nsaids contraindicated.  This should not be denied.

## 2013-06-12 NOTE — Telephone Encounter (Signed)
Prior Authorization for Voltaren Gel 1%  Denied.  Must have documentation that a one-week trial of an NSAID was ineffective, that the member cannot tolerate this type of medication or that this medication is detrimental to the [employer] health.

## 2013-06-12 NOTE — Telephone Encounter (Signed)
Additional information sent in to [facility].

## 2013-06-14 ENCOUNTER — Ambulatory Visit (INDEPENDENT_AMBULATORY_CARE_PROVIDER_SITE_OTHER): Payer: Medicare Other

## 2013-06-14 DIAGNOSIS — I4891 Unspecified atrial fibrillation: Secondary | ICD-10-CM

## 2013-06-14 DIAGNOSIS — Z7901 Long term (current) use of anticoagulants: Secondary | ICD-10-CM | POA: Diagnosis not present

## 2013-06-14 LAB — POCT INR: INR: 2.8

## 2013-06-15 DIAGNOSIS — S139XXA Sprain of joints and ligaments of unspecified parts of neck, initial encounter: Secondary | ICD-10-CM | POA: Diagnosis not present

## 2013-06-15 DIAGNOSIS — M542 Cervicalgia: Secondary | ICD-10-CM | POA: Diagnosis not present

## 2013-06-20 DIAGNOSIS — M542 Cervicalgia: Secondary | ICD-10-CM | POA: Diagnosis not present

## 2013-06-20 DIAGNOSIS — S139XXA Sprain of joints and ligaments of unspecified parts of neck, initial encounter: Secondary | ICD-10-CM | POA: Diagnosis not present

## 2013-06-21 NOTE — Telephone Encounter (Signed)
After review of additional clinical information Voltaren Gel 1% is still denied by [employer].  Dr. Lorelei Pont notified.

## 2013-06-23 DIAGNOSIS — M542 Cervicalgia: Secondary | ICD-10-CM | POA: Diagnosis not present

## 2013-06-23 DIAGNOSIS — S139XXA Sprain of joints and ligaments of unspecified parts of neck, initial encounter: Secondary | ICD-10-CM | POA: Diagnosis not present

## 2013-06-27 DIAGNOSIS — M542 Cervicalgia: Secondary | ICD-10-CM | POA: Diagnosis not present

## 2013-06-27 DIAGNOSIS — S139XXA Sprain of joints and ligaments of unspecified parts of neck, initial encounter: Secondary | ICD-10-CM | POA: Diagnosis not present

## 2013-06-29 DIAGNOSIS — S139XXA Sprain of joints and ligaments of unspecified parts of neck, initial encounter: Secondary | ICD-10-CM | POA: Diagnosis not present

## 2013-06-29 DIAGNOSIS — M542 Cervicalgia: Secondary | ICD-10-CM | POA: Diagnosis not present

## 2013-07-04 ENCOUNTER — Ambulatory Visit (INDEPENDENT_AMBULATORY_CARE_PROVIDER_SITE_OTHER): Payer: Medicare Other

## 2013-07-04 DIAGNOSIS — E538 Deficiency of other specified B group vitamins: Secondary | ICD-10-CM | POA: Diagnosis not present

## 2013-07-04 DIAGNOSIS — M542 Cervicalgia: Secondary | ICD-10-CM | POA: Diagnosis not present

## 2013-07-04 DIAGNOSIS — S139XXA Sprain of joints and ligaments of unspecified parts of neck, initial encounter: Secondary | ICD-10-CM | POA: Diagnosis not present

## 2013-07-04 MED ORDER — CYANOCOBALAMIN 1000 MCG/ML IJ SOLN
1000.0000 ug | Freq: Once | INTRAMUSCULAR | Status: AC
  Administered 2013-07-04: 1000 ug via INTRAMUSCULAR

## 2013-07-11 DIAGNOSIS — S139XXA Sprain of joints and ligaments of unspecified parts of neck, initial encounter: Secondary | ICD-10-CM | POA: Diagnosis not present

## 2013-07-11 DIAGNOSIS — M542 Cervicalgia: Secondary | ICD-10-CM | POA: Diagnosis not present

## 2013-07-13 DIAGNOSIS — M542 Cervicalgia: Secondary | ICD-10-CM | POA: Diagnosis not present

## 2013-07-13 DIAGNOSIS — S139XXA Sprain of joints and ligaments of unspecified parts of neck, initial encounter: Secondary | ICD-10-CM | POA: Diagnosis not present

## 2013-07-19 ENCOUNTER — Ambulatory Visit (INDEPENDENT_AMBULATORY_CARE_PROVIDER_SITE_OTHER): Payer: Medicare Other

## 2013-07-19 DIAGNOSIS — Z7901 Long term (current) use of anticoagulants: Secondary | ICD-10-CM

## 2013-07-19 DIAGNOSIS — I4891 Unspecified atrial fibrillation: Secondary | ICD-10-CM | POA: Diagnosis not present

## 2013-07-19 LAB — POCT INR: INR: 2.1

## 2013-07-24 DIAGNOSIS — N281 Cyst of kidney, acquired: Secondary | ICD-10-CM | POA: Diagnosis not present

## 2013-07-24 DIAGNOSIS — Z8546 Personal history of malignant neoplasm of prostate: Secondary | ICD-10-CM | POA: Diagnosis not present

## 2013-07-24 DIAGNOSIS — C61 Malignant neoplasm of prostate: Secondary | ICD-10-CM | POA: Diagnosis not present

## 2013-07-31 ENCOUNTER — Other Ambulatory Visit: Payer: Self-pay

## 2013-07-31 MED ORDER — LEVOTHYROXINE SODIUM 100 MCG PO TABS: 100.0000 ug | ORAL_TABLET | Freq: Every day | ORAL | Status: DC

## 2013-07-31 MED ORDER — TORSEMIDE 20 MG PO TABS: 20.0000 mg | ORAL_TABLET | ORAL | Status: DC | PRN

## 2013-07-31 MED ORDER — PANTOPRAZOLE SODIUM 40 MG PO TBEC: 40.0000 mg | DELAYED_RELEASE_TABLET | Freq: Every day | ORAL | Status: DC

## 2013-07-31 MED ORDER — PRAVASTATIN SODIUM 40 MG PO TABS: 40.0000 mg | ORAL_TABLET | Freq: Every day | ORAL | Status: DC

## 2013-07-31 NOTE — Telephone Encounter (Signed)
Mr. Marcos notified prescriptions have been sent to [facility] as requested.

## 2013-07-31 NOTE — Telephone Encounter (Signed)
Pt last seen on 06/07/13 for neck pain; last TSH 04/06/2012. Is OK to refill?

## 2013-07-31 NOTE — Telephone Encounter (Signed)
The patient came in and was hoping to get new prescriptions written for the following medications: Synthroid  Pravastatin Pantoprazole Torsemide   All through [facility]   Pt callback  [phone]

## 2013-08-03 ENCOUNTER — Ambulatory Visit (INDEPENDENT_AMBULATORY_CARE_PROVIDER_SITE_OTHER): Payer: Medicare Other

## 2013-08-03 DIAGNOSIS — E538 Deficiency of other specified B group vitamins: Secondary | ICD-10-CM

## 2013-08-03 MED ORDER — CYANOCOBALAMIN 1000 MCG/ML IJ SOLN
1000.0000 ug | Freq: Once | INTRAMUSCULAR | Status: AC
  Administered 2013-08-03: 1000 ug via INTRAMUSCULAR

## 2013-08-09 DIAGNOSIS — H4010X0 Unspecified open-angle glaucoma, stage unspecified: Secondary | ICD-10-CM | POA: Diagnosis not present

## 2013-08-30 ENCOUNTER — Ambulatory Visit (INDEPENDENT_AMBULATORY_CARE_PROVIDER_SITE_OTHER): Payer: Medicare Other

## 2013-08-30 DIAGNOSIS — I4891 Unspecified atrial fibrillation: Secondary | ICD-10-CM

## 2013-08-30 DIAGNOSIS — Z7901 Long term (current) use of anticoagulants: Secondary | ICD-10-CM | POA: Diagnosis not present

## 2013-08-30 LAB — POCT INR: INR: 2.9

## 2013-09-06 ENCOUNTER — Ambulatory Visit (INDEPENDENT_AMBULATORY_CARE_PROVIDER_SITE_OTHER): Payer: Medicare Other

## 2013-09-06 DIAGNOSIS — E538 Deficiency of other specified B group vitamins: Secondary | ICD-10-CM

## 2013-09-06 MED ORDER — CYANOCOBALAMIN 1000 MCG/ML IJ SOLN
1000.0000 ug | Freq: Once | INTRAMUSCULAR | Status: AC
  Administered 2013-09-06: 1000 ug via INTRAMUSCULAR

## 2013-10-04 ENCOUNTER — Ambulatory Visit (INDEPENDENT_AMBULATORY_CARE_PROVIDER_SITE_OTHER): Payer: Medicare Other

## 2013-10-04 DIAGNOSIS — I4891 Unspecified atrial fibrillation: Secondary | ICD-10-CM | POA: Diagnosis not present

## 2013-10-04 DIAGNOSIS — Z7901 Long term (current) use of anticoagulants: Secondary | ICD-10-CM | POA: Diagnosis not present

## 2013-10-04 LAB — POCT INR: INR: 1.7

## 2013-10-06 ENCOUNTER — Ambulatory Visit (INDEPENDENT_AMBULATORY_CARE_PROVIDER_SITE_OTHER): Payer: Medicare Other

## 2013-10-06 DIAGNOSIS — E538 Deficiency of other specified B group vitamins: Secondary | ICD-10-CM

## 2013-10-06 MED ORDER — CYANOCOBALAMIN 1000 MCG/ML IJ SOLN
1000.0000 ug | Freq: Once | INTRAMUSCULAR | Status: AC
  Administered 2013-10-06: 1000 ug via INTRAMUSCULAR

## 2013-10-16 IMAGING — CT CT CHEST W/ CM
1 series · 16 of 33 positions shown, 20 images · IV contrast (agent unspecified)
Comparison: none

REASON FOR EXAM: chest mass
COMMENTS:

PROCEDURE:     KCT - KCT CHEST WITH CONTRAST  - April 19, 2012 [DATE]
RESULT:
TECHNIQUE: Chest CT is performed with 75 ml of [id number] iodinated
intravenous contrast with images reconstructed at 3.0 mm slice thickness in
the axial plane.
There is no previous CT for comparison.

[Series 2: chest w/ 3.0 i31f 2 · axial · 0.85mm/px · z∈[-541,-217]mm · 16 of 118 slices shown, 20 images]
[im 5/118  mediastinal]
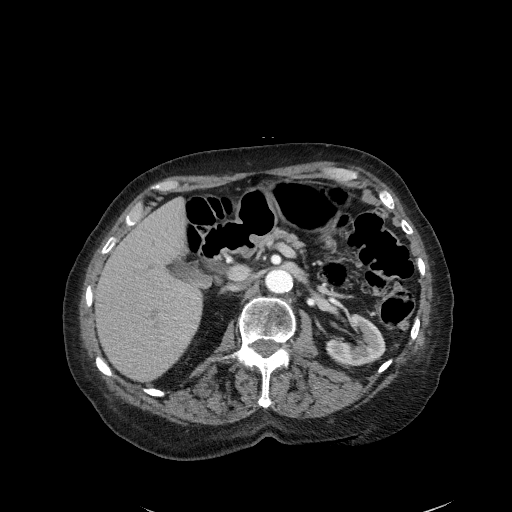
[im 5/118  lung]
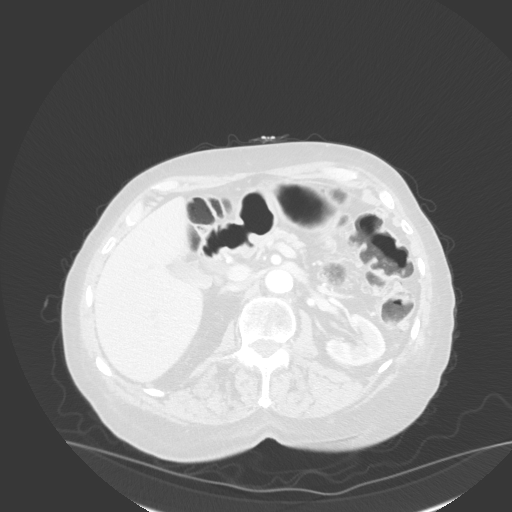
[im 14/118  lung]
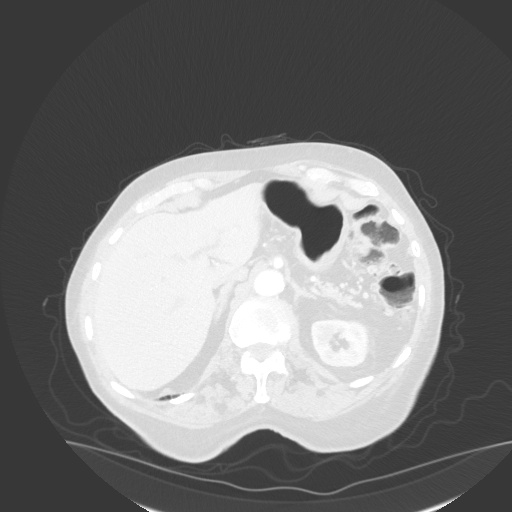
[im 22/118  lung]
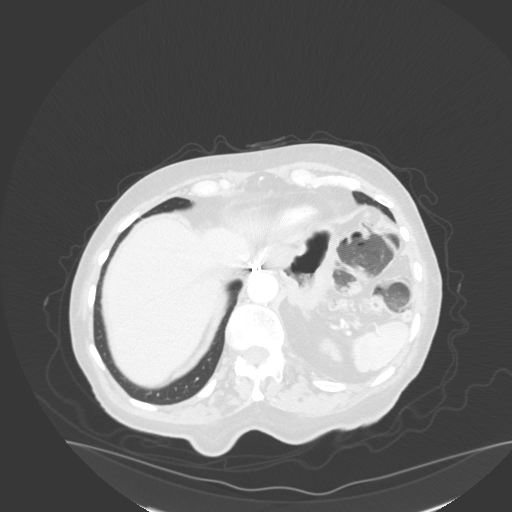
[im 27/118  lung]
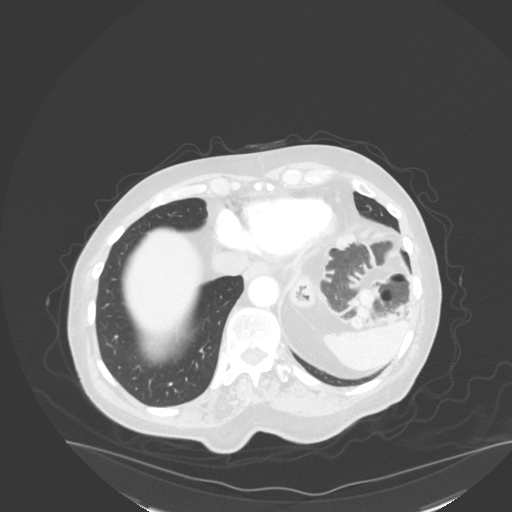
[im 35/118  mediastinal]
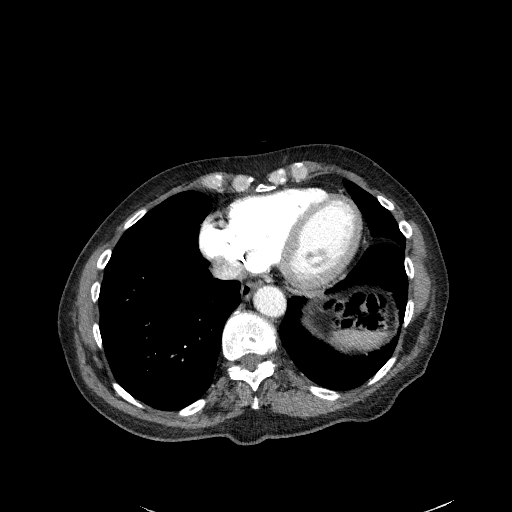
[im 35/118  lung]
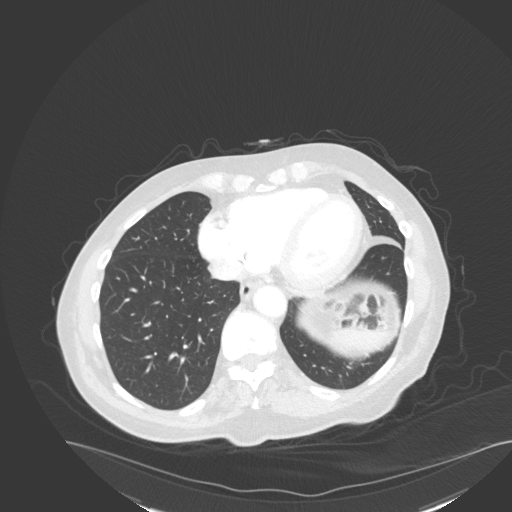
[im 44/118  lung]
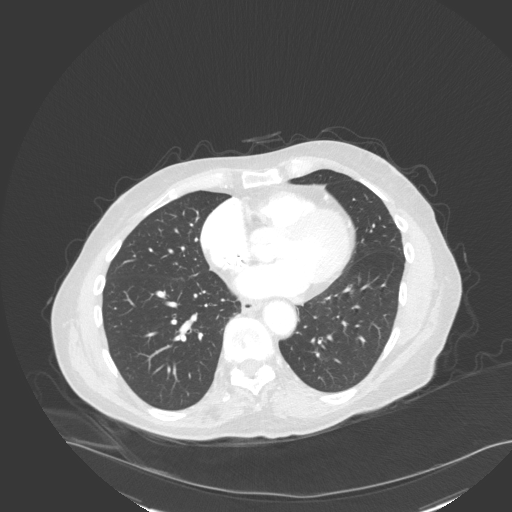
[im 48/118  lung]
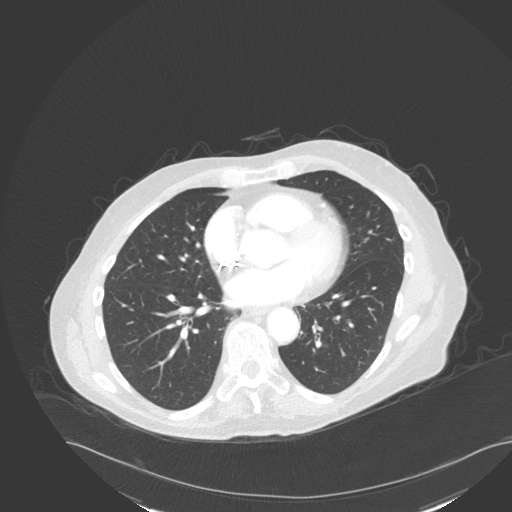
[im 57/118  lung]
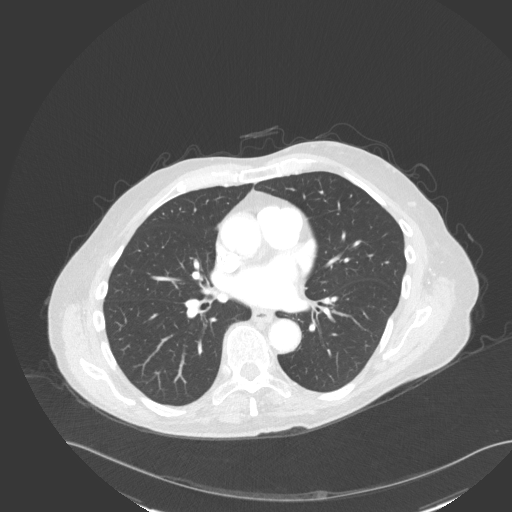
[im 62/118  mediastinal]
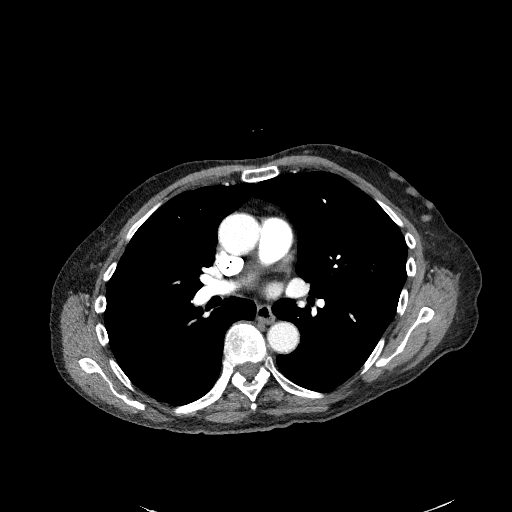
[im 62/118  lung]
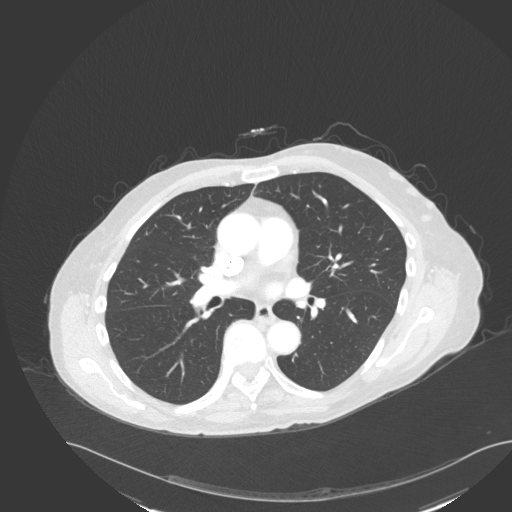
[im 70/118  lung]
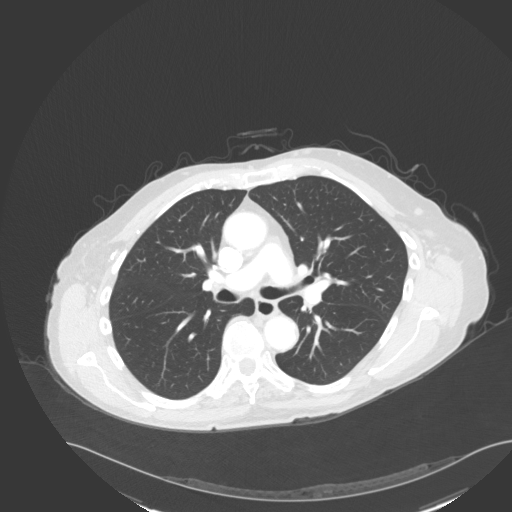
[im 74/118  lung]
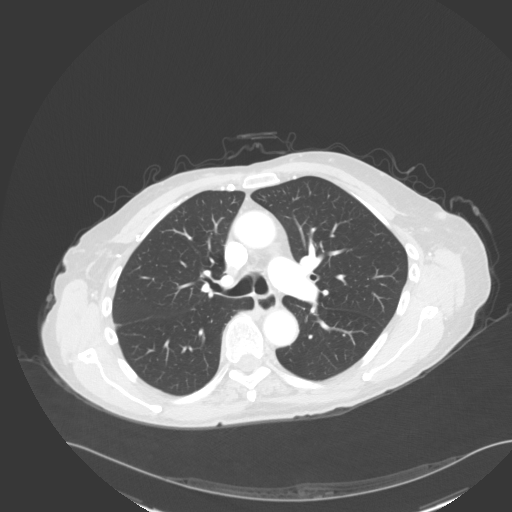
[im 83/118  lung]
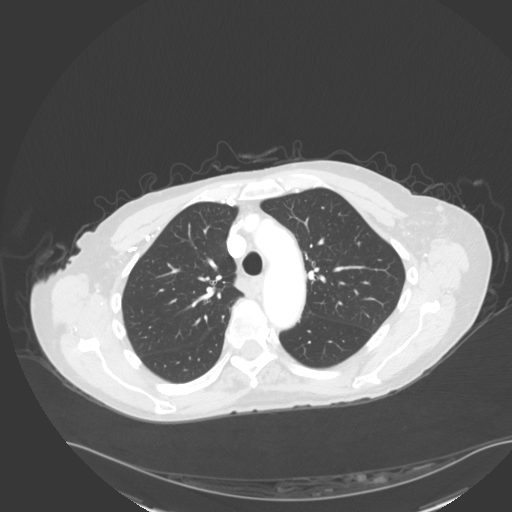
[im 92/118  mediastinal]
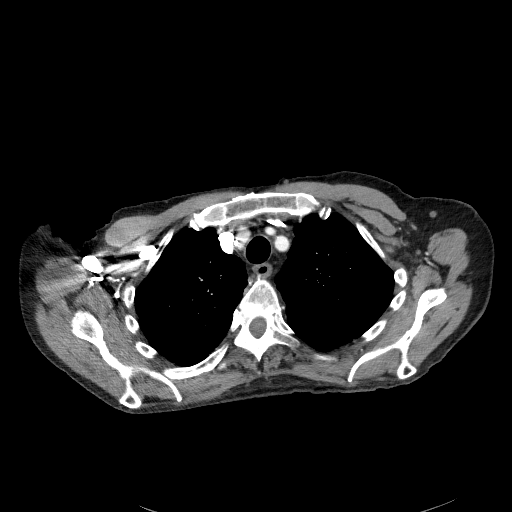
[im 92/118  lung]
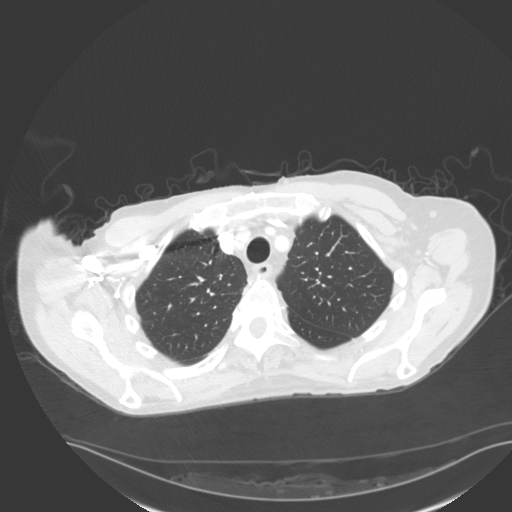
[im 96/118  lung]
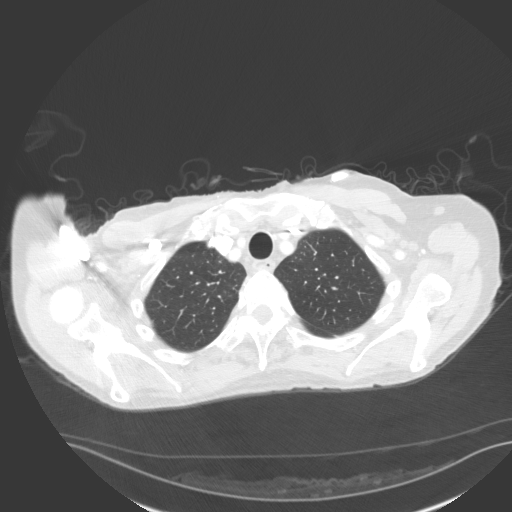
[im 105/118  lung]
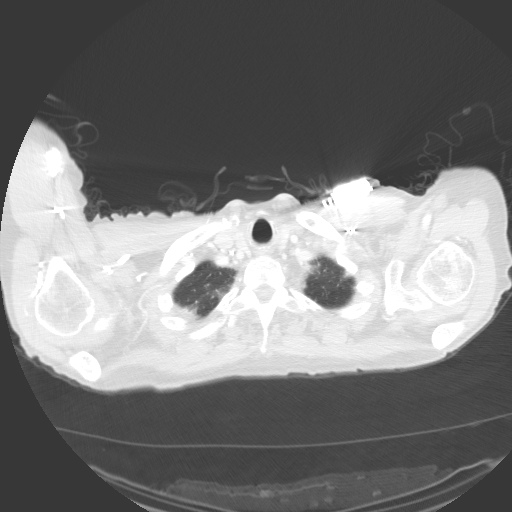
[im 113/118  lung]
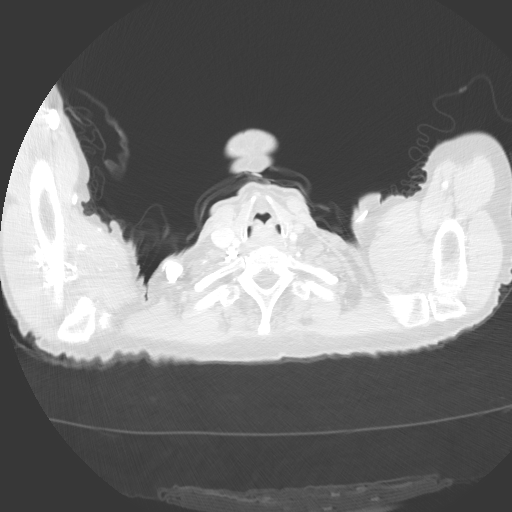

[16 of 33 positions shown; findings below may reference images not displayed]

FINDINGS: Lung window images show no focal pulmonary parenchymal mass,
effusion, pneumothorax or endobronchial lesion. The mediastinal window
images demonstrate no evidence of mediastinal or hilar mass or adenopathy.
There is a left sided pacemaker device present. There are multiple
collateral veins in the subcutaneous soft tissue over the anterior and
lateral left chest and axillary region anteriorly. No definite bony lesion
or mass is evident. No bony destruction is evident. The included upper
abdominal structures show some colonic diverticulosis and a small, left
renal cyst.
IMPRESSION: 1.     No identifiable chest mass or chest wall mass.
2.     Collateral venous structures are seen over the left che[REDACTED]

## 2013-10-24 DIAGNOSIS — Z4802 Encounter for removal of sutures: Secondary | ICD-10-CM | POA: Diagnosis not present

## 2013-10-24 DIAGNOSIS — L570 Actinic keratosis: Secondary | ICD-10-CM | POA: Diagnosis not present

## 2013-10-27 ENCOUNTER — Ambulatory Visit (INDEPENDENT_AMBULATORY_CARE_PROVIDER_SITE_OTHER): Payer: Medicare Other | Admitting: *Deleted

## 2013-10-27 DIAGNOSIS — I4891 Unspecified atrial fibrillation: Secondary | ICD-10-CM

## 2013-10-27 DIAGNOSIS — Z7901 Long term (current) use of anticoagulants: Secondary | ICD-10-CM

## 2013-10-27 LAB — MDC_IDC_ENUM_SESS_TYPE_INCLINIC
Brady Statistic AP VS Percent: 98.74 %
Brady Statistic AS VP Percent: 0 %
Brady Statistic AS VS Percent: 1.15 %
Brady Statistic RA Percent Paced: 98.85 %
Brady Statistic RV Percent Paced: 0.11 %
Date Time Interrogation Session: 20150731154302
Lead Channel Impedance Value: 392 Ohm
Lead Channel Pacing Threshold Amplitude: 1 V
Lead Channel Pacing Threshold Pulse Width: 0.4 ms
Lead Channel Pacing Threshold Pulse Width: 0.4 ms
Lead Channel Sensing Intrinsic Amplitude: 5.417
Lead Channel Setting Pacing Amplitude: 2 V
Lead Channel Setting Pacing Amplitude: 2.5 V
Lead Channel Setting Sensing Sensitivity: 0.9 mV
MDC IDC MSMT BATTERY VOLTAGE: 2.93 V
MDC IDC MSMT LEADCHNL RA SENSING INTR AMPL: 1.811
MDC IDC MSMT LEADCHNL RV IMPEDANCE VALUE: 552 Ohm
MDC IDC MSMT LEADCHNL RV PACING THRESHOLD AMPLITUDE: 1 V
MDC IDC SET LEADCHNL RV PACING PULSEWIDTH: 0.4 ms
MDC IDC STAT BRADY AP VP PERCENT: 0.11 %
Zone Setting Detection Interval: 350 ms
Zone Setting Detection Interval: 400 ms

## 2013-10-27 LAB — POCT INR: INR: 1.5

## 2013-10-27 NOTE — Progress Notes (Signed)
PPM check in office. 

## 2013-11-08 ENCOUNTER — Ambulatory Visit (INDEPENDENT_AMBULATORY_CARE_PROVIDER_SITE_OTHER): Payer: Medicare Other

## 2013-11-08 DIAGNOSIS — E538 Deficiency of other specified B group vitamins: Secondary | ICD-10-CM

## 2013-11-08 MED ORDER — CYANOCOBALAMIN 1000 MCG/ML IJ SOLN
1000.0000 ug | Freq: Once | INTRAMUSCULAR | Status: AC
  Administered 2013-11-08: 1000 ug via INTRAMUSCULAR

## 2013-11-22 ENCOUNTER — Ambulatory Visit (INDEPENDENT_AMBULATORY_CARE_PROVIDER_SITE_OTHER): Payer: Medicare Other

## 2013-11-22 DIAGNOSIS — Z7901 Long term (current) use of anticoagulants: Secondary | ICD-10-CM | POA: Diagnosis not present

## 2013-11-22 DIAGNOSIS — I4891 Unspecified atrial fibrillation: Secondary | ICD-10-CM

## 2013-11-22 LAB — POCT INR: INR: 2.1

## 2013-11-24 ENCOUNTER — Encounter: Payer: Self-pay | Admitting: Internal Medicine

## 2013-12-08 ENCOUNTER — Ambulatory Visit (INDEPENDENT_AMBULATORY_CARE_PROVIDER_SITE_OTHER): Payer: Medicare Other

## 2013-12-08 DIAGNOSIS — E538 Deficiency of other specified B group vitamins: Secondary | ICD-10-CM

## 2013-12-08 MED ORDER — CYANOCOBALAMIN 1000 MCG/ML IJ SOLN
1000.0000 ug | Freq: Once | INTRAMUSCULAR | Status: AC
  Administered 2013-12-08: 1000 ug via INTRAMUSCULAR

## 2013-12-12 ENCOUNTER — Other Ambulatory Visit (INDEPENDENT_AMBULATORY_CARE_PROVIDER_SITE_OTHER): Payer: Medicare Other

## 2013-12-12 DIAGNOSIS — E538 Deficiency of other specified B group vitamins: Secondary | ICD-10-CM

## 2013-12-12 DIAGNOSIS — I10 Essential (primary) hypertension: Secondary | ICD-10-CM | POA: Diagnosis not present

## 2013-12-12 DIAGNOSIS — E039 Hypothyroidism, unspecified: Secondary | ICD-10-CM | POA: Diagnosis not present

## 2013-12-12 DIAGNOSIS — E785 Hyperlipidemia, unspecified: Secondary | ICD-10-CM | POA: Diagnosis not present

## 2013-12-12 LAB — COMPREHENSIVE METABOLIC PANEL
ALBUMIN: 3.6 g/dL (ref 3.5–5.2)
ALK PHOS: 92 U/L (ref 39–117)
ALT: 29 U/L (ref 0–53)
AST: 42 U/L — AB (ref 0–37)
BUN: 21 mg/dL (ref 6–23)
CALCIUM: 9.1 mg/dL (ref 8.4–10.5)
CHLORIDE: 110 meq/L (ref 96–112)
CO2: 28 mEq/L (ref 19–32)
Creatinine, Ser: 1.2 mg/dL (ref 0.4–1.5)
GFR: 61.82 mL/min (ref >60.00)
Glucose, Bld: 92 mg/dL (ref 70–99)
Potassium: 4.3 mEq/L (ref 3.5–5.1)
Sodium: 141 mEq/L (ref 135–145)
Total Bilirubin: 0.9 mg/dL (ref 0.2–1.2)
Total Protein: 6.5 g/dL (ref 6.0–8.3)

## 2013-12-12 LAB — LIPID PANEL
CHOL/HDL RATIO: 2
Cholesterol: 140 mg/dL (ref 0–200)
HDL: 62.2 mg/dL (ref >39.00)
LDL CALC: 68 mg/dL (ref 0–99)
NONHDL: 77.8
TRIGLYCERIDES: 51 mg/dL (ref 0.0–149.0)
VLDL: 10.2 mg/dL (ref 0.0–40.0)

## 2013-12-12 LAB — VITAMIN B12: Vitamin B-12: 1128 pg/mL — ABNORMAL HIGH (ref 211–911)

## 2013-12-12 LAB — TSH: TSH: 5.11 u[IU]/mL — ABNORMAL HIGH (ref 0.35–4.50)

## 2013-12-13 ENCOUNTER — Ambulatory Visit (INDEPENDENT_AMBULATORY_CARE_PROVIDER_SITE_OTHER): Payer: Medicare Other

## 2013-12-13 DIAGNOSIS — I4891 Unspecified atrial fibrillation: Secondary | ICD-10-CM

## 2013-12-13 DIAGNOSIS — Z7901 Long term (current) use of anticoagulants: Secondary | ICD-10-CM

## 2013-12-13 LAB — CBC WITH DIFFERENTIAL/PLATELET
Basophils Absolute: 0 10*3/uL (ref 0.0–0.1)
Basophils Relative: 0.7 % (ref 0.0–3.0)
EOS ABS: 0.2 10*3/uL (ref 0.0–0.7)
Eosinophils Relative: 3.7 % (ref 0.0–5.0)
HEMATOCRIT: 37.1 % — AB (ref 39.0–52.0)
Hemoglobin: 12.3 g/dL — ABNORMAL LOW (ref 13.0–17.0)
Lymphocytes Relative: 24 % (ref 12.0–46.0)
Lymphs Abs: 1.3 10*3/uL (ref 0.7–4.0)
MCHC: 33.2 g/dL (ref 30.0–36.0)
MCV: 98.4 fl (ref 78.0–100.0)
MONO ABS: 0.4 10*3/uL (ref 0.1–1.0)
Monocytes Relative: 8 % (ref 3.0–12.0)
Neutro Abs: 3.4 10*3/uL (ref 1.4–7.7)
Neutrophils Relative %: 63.6 % (ref 43.0–77.0)
Platelets: 194 10*3/uL (ref 150.0–400.0)
RBC: 3.77 Mil/uL — ABNORMAL LOW (ref 4.22–5.81)
RDW: 14.7 % (ref 11.5–15.5)
WBC: 5.4 10*3/uL (ref 4.0–10.5)

## 2013-12-13 LAB — POCT INR: INR: 2.3

## 2013-12-14 ENCOUNTER — Encounter: Payer: Self-pay | Admitting: Family Medicine

## 2013-12-14 ENCOUNTER — Ambulatory Visit (INDEPENDENT_AMBULATORY_CARE_PROVIDER_SITE_OTHER): Payer: Medicare Other | Admitting: Family Medicine

## 2013-12-14 VITALS — BP 120/74 | HR 81 | Temp 98.1°F | Ht 69.0 in | Wt 150.8 lb

## 2013-12-14 DIAGNOSIS — Z0000 Encounter for general adult medical examination without abnormal findings: Secondary | ICD-10-CM | POA: Diagnosis not present

## 2013-12-14 DIAGNOSIS — Z23 Encounter for immunization: Secondary | ICD-10-CM

## 2013-12-14 DIAGNOSIS — I480 Paroxysmal atrial fibrillation: Secondary | ICD-10-CM

## 2013-12-14 DIAGNOSIS — E039 Hypothyroidism, unspecified: Secondary | ICD-10-CM | POA: Diagnosis not present

## 2013-12-14 DIAGNOSIS — E538 Deficiency of other specified B group vitamins: Secondary | ICD-10-CM

## 2013-12-14 DIAGNOSIS — M47812 Spondylosis without myelopathy or radiculopathy, cervical region: Secondary | ICD-10-CM | POA: Diagnosis not present

## 2013-12-14 DIAGNOSIS — I4891 Unspecified atrial fibrillation: Secondary | ICD-10-CM

## 2013-12-14 DIAGNOSIS — D508 Other iron deficiency anemias: Secondary | ICD-10-CM

## 2013-12-14 DIAGNOSIS — D509 Iron deficiency anemia, unspecified: Secondary | ICD-10-CM

## 2013-12-14 DIAGNOSIS — E785 Hyperlipidemia, unspecified: Secondary | ICD-10-CM

## 2013-12-14 DIAGNOSIS — I10 Essential (primary) hypertension: Secondary | ICD-10-CM

## 2013-12-14 NOTE — Progress Notes (Signed)
Pre visit review using our clinic review tool, if applicable. No additional management support is needed unless otherwise documented below in the visit note. 

## 2013-12-14 NOTE — Progress Notes (Signed)
Dr. Frederico Hamman T. Orli Degrave, MD, [facility] [place], [address] Phone: [phone] Fax: [phone]  12/14/2013  Patient: [name] [name], MRN: [id number], DOB: March 17, 1923, 80 y.o.  Primary Physician:  Owens Loffler, MD  Chief Complaint: Annual Exam  Subjective:   [name] [name] is a 80 y.o. pleasant patient who presents for a medicare wellness examination:  Preventative Health Maintenance Visit and f/u chronic med probs.  Health Maintenance Summary Reviewed and updated, unless pt declines services.  Tobacco History Reviewed. Alcohol: No concerns, no excessive use Exercise Habits: Some activity, rec at least 30 mins 5 times a week - limited some by his lady friend's activity level. STD concerns: no risk or activity to increase risk Drug Use: None Encouraged self-testicular check  Prevnar-13 Some trouble walking.   Neck doing really well.   At age 34, 81 - now down to 151 pounds.  Wt Readings from Last 3 Encounters:  12/14/13 150 lb 12 oz (68.38 kg)  06/07/13 157 lb 4 oz (71.328 kg)  04/18/13 160 lb 8 oz (72.802 kg)    Now 151.  No blood or black stool.   Flex sig in 2005.  Bleeds very easy. On Coumadin Chronically  Health Maintenance  Topic Date Due  . Tetanus/tdap  03/30/2013  . Influenza Vaccine  10/28/2013  . Pneumococcal Polysaccharide Vaccine Age 59 And Over  Completed  . Zostavax  Completed    Immunization History  Administered Date(s) Administered  . H1N1 05/09/2008  . Influenza Split 02/10/2011  . Influenza Whole 01/15/2009, 01/13/2010  . Influenza, High Dose Seasonal PF 12/21/2012  . Pneumococcal Conjugate-13 12/14/2013  . Pneumococcal Polysaccharide-23 03/31/2003, 03/30/2005  . Td 03/31/2003  . Zoster 02/06/2008   Thyroid: No symptoms. Labs reviewed. Denies cold / heat intolerance, dry skin, hair loss. No goiter.  Lab Results  Component Value Date   TSH 5.11* 12/12/2013      F/u anemia and b12. CBC:    Component Value Date/Time   WBC 5.4 12/12/2013 0820   HGB 12.3* 12/12/2013 0820   HCT 37.1* 12/12/2013 0820   PLT 194.0 12/12/2013 0820   MCV 98.4 12/12/2013 0820   NEUTROABS 3.4 12/12/2013 0820   LYMPHSABS 1.3 12/12/2013 0820   MONOABS 0.4 12/12/2013 0820   EOSABS 0.2 12/12/2013 0820   BASOSABS 0.0 12/12/2013 0820    Lab Results  Component Value Date   VITAMINB12 1128* 12/12/2013    HTN: Tolerating all medications without side effects Stable and at goal No CP, no sob. No HA.  BP Readings from Last 3 Encounters:  12/14/13 120/74  06/07/13 104/62  04/18/13 118/71   Lipids: Doing well, stable. Tolerating meds fine with no SE. Panel reviewed with patient.  Lipids:    Component Value Date/Time   CHOL 140 12/12/2013 0820   TRIG 51.0 12/12/2013 0820   HDL 62.20 12/12/2013 0820   VLDL 10.2 12/12/2013 0820   CHOLHDL 2 12/12/2013 0820    Lab Results  Component Value Date   ALT 29 12/12/2013   AST 42* 12/12/2013   ALKPHOS 92 12/12/2013   BILITOT 0.9 12/12/2013     Patient Active Problem List   Diagnosis Date Noted  . DDD (degenerative disc disease), cervical, Severe 06/09/2013  . Neck arthritis, Severe, multi-level 06/09/2013  . Sinoatrial node dysfunction   . Encounter for long-term (current) use of anticoagulants 06/24/2010  . IRON DEFICIENCY 04/17/2010  . GLAUCOMA 08/07/2008  . GERD 08/07/2008  .  PACEMAKER, PERMANENT 08/07/2008  . B12 DEFICIENCY 07/19/2008  . PEPTIC ULCER DISEASE 07/18/2008  . RADIATION PROCTITIS 07/18/2008  . DIVERTICULOSIS, COLON 07/18/2008  . Prostate cancer, in remission 07/18/2008  . HYPERTENSION, ESSENTIAL NOS 05/09/2008  . HYPERLIPIDEMIA 02/06/2008  . PAROXYSMAL ATRIAL FIBRILLATION 02/06/2008  . HYPOTHYROIDISM 12/01/2007  . HERN UNS SITE ABD CAV W/O MENTION OBST/GANGREN 12/01/2007   Past Medical History  Diagnosis Date  . GI hemorrhage   . Radiation proctitis   . Eye cancer 10/2007    sebaceous cell carcinoma, left  eye  . Paroxysmal atrial fibrillation     s/p failed ablation  . Cardiac pacemaker in situ 07/2005    symptomatic bradycardia  . Hyperlipidemia   . Hypertension   . GERD (gastroesophageal reflux disease)   . Unspecified glaucoma   . Vitamin B12 deficiency   . Peptic ulcer, unspecified site, unspecified as acute or chronic, without mention of hemorrhage, perforation, or obstruction 1975  . Personal history of malignant neoplasm of prostate 12/2000    5 weeks radiation, radiation seeds  . Diverticulosis of colon (without mention of hemorrhage)   . Unspecified hypothyroidism   . 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 Sinoatrial node dysfunction   . Neck arthritis, Severe, multi-level 06/09/2013   Past Surgical History  Procedure Laterality Date  . Hernia repair  1994, 2001, 2003    s/p drainage and complications, 11/8336, 05/5051 mesh removal  . Insert / replace / remove pacemaker  07/2005    symptomatic bradycardia  . Cardiac electrophysiology mapping and ablation  2001, 2003    failure  . Cataract extraction    . Cardiac catheterization    . Tooth extraction    . Nose surgery      cancer removed    History   Social History  . Marital Status: Widowed    Spouse Name: N/A    Number of Children: N/A  . Years of Education: N/A   Occupational History  . Not on file.   Social History Main Topics  . Smoking status: Never Smoker   . Smokeless tobacco: Never Used  . Alcohol Use: No  . Drug Use: No  . Sexual Activity: Not on file   Other Topics Concern  . Not on file   Social History Narrative  . No narrative on file   Family History  Problem Relation Age of Onset  . Breast cancer Mother   . Bone cancer Father   . Alcohol abuse    . Coronary artery disease    . Dementia    . Diabetes     Allergies  Allergen Reactions  . Penicillins     Medication list has been reviewed and updated.   General: Denies fever, chills, sweats. No significant weight  loss. Eyes: Denies blurring,significant itching ENT: Denies earache, sore throat, and hoarseness. Cardiovascular: Denies chest pains, palpitations, dyspnea on exertion Respiratory: Denies cough, dyspnea at rest,wheeezing Breast: no concerns about lumps GI: Denies nausea, vomiting, diarrhea, constipation, change in bowel habits, abdominal pain, melena, hematochezia GU: Denies penile discharge, ED, urinary flow / outflow problems. No STD concerns. Musculoskeletal: Denies back pain, joint pain Derm: Denies rash, itching Neuro: Denies  paresthesias, frequent falls, frequent headaches Psych: Denies depression, anxiety Endocrine: Denies cold intolerance, heat intolerance, polydipsia Heme: Denies enlarged lymph nodes Allergy: No hayfever  Objective:   BP 120/74  Pulse 81  Temp(Src) 98.1 F (36.7 C) (Oral)  Ht 5' 9"  (1.753 m)  Wt 150 lb 12  oz (68.38 kg)  BMI 22.25 kg/m2  The patient completed a fall screen and PHQ-2 and PHQ-9 if necessary, which is documented in the EHR. The CMA/LPN/RN who assisted the patient verbally completed with them and documented results in [place].   Hearing Screening   Method: Audiometry   125Hz  250Hz  500Hz  1000Hz  2000Hz  4000Hz  8000Hz   Right ear:   20 20 20  0   Left ear:   40 0 0 40   Vision Screening Comments: Wears Glasses-Seen by Dr[facility] T[facility] Sos at [facility] 07/2013.  GEN: well developed, well nourished, no acute distress Eyes: conjunctiva and lids normal, PERRLA, EOMI ENT: TM clear, nares clear, oral exam WNL Neck: supple, no lymphadenopathy, no thyromegaly, no JVD Pulm: clear to auscultation and percussion, respiratory effort normal CV: regular rate and rhythm, S1-S2, no murmur, rub or gallop, no bruits, peripheral pulses normal and symmetric, no cyanosis, clubbing, edema or varicosities GI: soft, non-tender; no hepatosplenomegaly, masses; active bowel sounds all quadrants GU: no hernia, testicular mass, penile discharge Lymph: no  cervical, axillary or inguinal adenopathy MSK: gait normal, muscle tone and strength WNL, no joint swelling, effusions, discoloration, crepitus  SKIN: clear, good turgor, color WNL, no rashes, lesions, or ulcerations Neuro: normal mental status, normal strength, sensation, and motion Psych: alert; oriented to person, place and time, normally interactive and not anxious or depressed in appearance.  All labs reviewed with patient.  Lipids:    Component Value Date/Time   CHOL 140 12/12/2013 0820   TRIG 51.0 12/12/2013 0820   HDL 62.20 12/12/2013 0820   VLDL 10.2 12/12/2013 0820   CHOLHDL 2 12/12/2013 0820   CBC: CBC Latest Ref Rng 12/12/2013 04/06/2012 09/23/2011  WBC 4.0 - 10.5 K/uL 5.4 6.2 5.2  Hemoglobin 13.0 - 17.0 g/dL 12.3(L) 12.9(L) 11.9(L)  Hematocrit 39.0 - 52.0 % 37.1(L) 39.1 36.3(L)  Platelets 150.0 - 400.0 K/uL 194.0 230.0 950.9    Basic Metabolic Panel:    Component Value Date/Time   NA 141 12/12/2013 0820   K 4.3 12/12/2013 0820   CL 110 12/12/2013 0820   CO2 28 12/12/2013 0820   BUN 21 12/12/2013 0820   CREATININE 1.2 12/12/2013 0820   GLUCOSE 92 12/12/2013 0820   CALCIUM 9.1 12/12/2013 0820   Hepatic Function Latest Ref Rng 12/12/2013 04/18/2013 11/08/2012  Total Protein 6.0 - 8.3 g/dL 6.5 5.8(L) 6.3  Albumin 3.5 - 5.2 g/dL 3.6 - 3.5  AST 0 - 37 U/L 42(H) 31 27  ALT 0 - 53 U/L 29 21 20   Alk Phosphatase 39 - 117 U/L 92 102 82  Total Bilirubin 0.2 - 1.2 mg/dL 0.9 0.5 1.1  Bilirubin, Direct 0.00 - 0.40 mg/dL - 0.13 -    Lab Results  Component Value Date   TSH 5.11* 12/12/2013   No results found for this basename: PSA    Assessment and Plan:   Routine general medical examination at a health care facility - Plan: Fecal occult blood, imunochemical  Other iron deficiency anemias - Plan: CANCELED: Fecal occult blood, imunochemical  Need for prophylactic vaccination against Streptococcus pneumoniae (pneumococcus) - Plan: Pneumococcal conjugate vaccine  13-valent  Paroxysmal atrial fibrillation  Neck arthritis, Severe, multi-level  IRON DEFICIENCY: check IFOB  HYPOTHYROIDISM: repeat TSH in 1 month  HYPERTENSION, ESSENTIAL NOS: stable  HYPERLIPIDEMIA: stable  B12 DEFICIENCY: stable  Health Maintenance Exam: The patient's preventative maintenance and recommended screening tests for an annual wellness exam were reviewed in full today. Brought up to date unless services  declined.  Counselled on the importance of diet, exercise, and its role in overall health and mortality. The patient's FH and SH was reviewed, including their home life, tobacco status, and drug and alcohol status.  I have personally reviewed the Medicare Annual Wellness questionnaire and have noted 1. The patient's medical and social history 2. Their use of alcohol, tobacco or illicit drugs 3. Their current medications and supplements 4. The patient's functional ability including ADL's, fall risks, home safety risks and hearing or visual             impairment. 5. Diet and physical activities 6. Evidence for depression or mood disorders  The patients weight, height, BMI and visual acuity have been recorded in the chart I have made referrals, counseling and provided education to the patient based review of the above and I have provided the pt with a written personalized care plan for preventive services.  I have provided the patient with a copy of your personalized plan for preventive services. Instructed to take the time to review along with their updated medication list.  Follow-up: No Follow-up on file. Or follow-up in 1 year for complete physical examination  New Prescriptions   No medications on file   Orders Placed This Encounter  Procedures  . Fecal occult blood, imunochemical  . Pneumococcal conjugate vaccine 13-valent    Signed,  Hoke Baer T. Keyosha Tiedt, MD   Patient's Medications  New Prescriptions   No medications on file  Previous  Medications   ALENDRONATE (FOSAMAX) 70 MG TABLET    Take 1 tablet (70 mg total) by mouth every 7 (seven) days. Take with a full glass of water on an empty stomach.   AMIODARONE (PACERONE) 200 MG TABLET    Take 0.5 tablets (100 mg total) by mouth daily.   CLINDAMYCIN (CLEOCIN) 300 MG CAPSULE    Take by mouth. 2 tablets by mouth 1 hour prior to dental procedure   CYANOCOBALAMIN (VITAMIN B-12 IJ)    Inject 1,000 mcg/mL as directed every 30 (thirty) days.     DORZOLAMIDE-TIMOLOL (COSOPT) 22.3-6.8 MG/ML OPHTHALMIC SOLUTION    Place 1 drop into both eyes 2 (two) times daily.     FERROUS SULFATE 325 (65 FE) MG EC TABLET    Take 325 mg by mouth daily with breakfast.     LEVOTHYROXINE (SYNTHROID, LEVOTHROID) 100 MCG TABLET    Take 1 tablet (100 mcg total) by mouth daily.   MULTIPLE VITAMIN (MULTIVITAMIN) TABLET    Take 1 tablet by mouth daily.     PANTOPRAZOLE (PROTONIX) 40 MG TABLET    Take 1 tablet (40 mg total) by mouth daily.   PRAVASTATIN (PRAVACHOL) 40 MG TABLET    Take 1 tablet (40 mg total) by mouth daily.   TORSEMIDE (DEMADEX) 20 MG TABLET    Take 1 tablet (20 mg total) by mouth as needed.   WHEAT DEXTRIN (BENEFIBER DRINK MIX PO)    Take by mouth as directed.    Modified Medications   Modified Medication Previous Medication   WARFARIN (COUMADIN) 4 MG TABLET warfarin (COUMADIN) 4 MG tablet      as directed by coumadin clinic    Take 1 tablet (4 mg total) by mouth as directed.  Discontinued Medications   No medications on file

## 2013-12-25 ENCOUNTER — Encounter: Payer: Self-pay | Admitting: *Deleted

## 2013-12-25 ENCOUNTER — Other Ambulatory Visit (INDEPENDENT_AMBULATORY_CARE_PROVIDER_SITE_OTHER): Payer: Medicare Other

## 2013-12-25 DIAGNOSIS — Z0000 Encounter for general adult medical examination without abnormal findings: Secondary | ICD-10-CM

## 2013-12-25 LAB — FECAL OCCULT BLOOD, IMMUNOCHEMICAL: Fecal Occult Bld: NEGATIVE

## 2014-01-08 DIAGNOSIS — Z23 Encounter for immunization: Secondary | ICD-10-CM | POA: Diagnosis not present

## 2014-01-10 ENCOUNTER — Ambulatory Visit (INDEPENDENT_AMBULATORY_CARE_PROVIDER_SITE_OTHER): Payer: Medicare Other

## 2014-01-10 DIAGNOSIS — E538 Deficiency of other specified B group vitamins: Secondary | ICD-10-CM

## 2014-01-10 DIAGNOSIS — Z7901 Long term (current) use of anticoagulants: Secondary | ICD-10-CM

## 2014-01-10 DIAGNOSIS — I4891 Unspecified atrial fibrillation: Secondary | ICD-10-CM | POA: Diagnosis not present

## 2014-01-10 LAB — POCT INR: INR: 2.8

## 2014-01-10 MED ORDER — CYANOCOBALAMIN 1000 MCG/ML IJ SOLN
1000.0000 ug | Freq: Once | INTRAMUSCULAR | Status: AC
  Administered 2014-01-10: 1000 ug via INTRAMUSCULAR

## 2014-01-31 DIAGNOSIS — H4011X2 Primary open-angle glaucoma, moderate stage: Secondary | ICD-10-CM | POA: Diagnosis not present

## 2014-02-01 ENCOUNTER — Other Ambulatory Visit: Payer: Self-pay

## 2014-02-01 MED ORDER — ALENDRONATE SODIUM 70 MG PO TABS: 70.0000 mg | ORAL_TABLET | ORAL | Status: DC

## 2014-02-01 NOTE — Telephone Encounter (Signed)
Pt left note requesting refill fosamax to [facility];left v/m advising pt refill done.

## 2014-02-14 ENCOUNTER — Ambulatory Visit (INDEPENDENT_AMBULATORY_CARE_PROVIDER_SITE_OTHER): Payer: Medicare Other

## 2014-02-14 DIAGNOSIS — I4891 Unspecified atrial fibrillation: Secondary | ICD-10-CM

## 2014-02-14 DIAGNOSIS — Z7901 Long term (current) use of anticoagulants: Secondary | ICD-10-CM

## 2014-02-14 LAB — POCT INR: INR: 2.1

## 2014-02-16 ENCOUNTER — Ambulatory Visit (INDEPENDENT_AMBULATORY_CARE_PROVIDER_SITE_OTHER): Payer: Medicare Other

## 2014-02-16 DIAGNOSIS — E538 Deficiency of other specified B group vitamins: Secondary | ICD-10-CM | POA: Diagnosis not present

## 2014-02-16 MED ORDER — CYANOCOBALAMIN 1000 MCG/ML IJ SOLN
1000.0000 ug | Freq: Once | INTRAMUSCULAR | Status: AC
  Administered 2014-02-16: 1000 ug via INTRAMUSCULAR

## 2014-03-21 ENCOUNTER — Ambulatory Visit (INDEPENDENT_AMBULATORY_CARE_PROVIDER_SITE_OTHER): Payer: Medicare Other

## 2014-03-21 DIAGNOSIS — E538 Deficiency of other specified B group vitamins: Secondary | ICD-10-CM | POA: Diagnosis not present

## 2014-03-21 MED ORDER — CYANOCOBALAMIN 1000 MCG/ML IJ SOLN
1000.0000 ug | Freq: Once | INTRAMUSCULAR | Status: AC
  Administered 2014-03-21: 1000 ug via INTRAMUSCULAR

## 2014-04-04 ENCOUNTER — Ambulatory Visit (INDEPENDENT_AMBULATORY_CARE_PROVIDER_SITE_OTHER): Payer: Medicare Other

## 2014-04-04 DIAGNOSIS — Z7901 Long term (current) use of anticoagulants: Secondary | ICD-10-CM | POA: Diagnosis not present

## 2014-04-04 DIAGNOSIS — I4891 Unspecified atrial fibrillation: Secondary | ICD-10-CM | POA: Diagnosis not present

## 2014-04-04 LAB — POCT INR: INR: 5.3

## 2014-04-12 ENCOUNTER — Other Ambulatory Visit: Payer: Self-pay | Admitting: Family Medicine

## 2014-04-12 MED ORDER — WARFARIN SODIUM 4 MG PO TABS: ORAL_TABLET | ORAL | Status: DC

## 2014-04-18 ENCOUNTER — Ambulatory Visit (INDEPENDENT_AMBULATORY_CARE_PROVIDER_SITE_OTHER): Payer: Medicare Other

## 2014-04-18 DIAGNOSIS — Z7901 Long term (current) use of anticoagulants: Secondary | ICD-10-CM | POA: Diagnosis not present

## 2014-04-18 DIAGNOSIS — I4891 Unspecified atrial fibrillation: Secondary | ICD-10-CM

## 2014-04-18 LAB — POCT INR: INR: 2.8

## 2014-04-24 ENCOUNTER — Ambulatory Visit (INDEPENDENT_AMBULATORY_CARE_PROVIDER_SITE_OTHER): Payer: Medicare Other | Admitting: Internal Medicine

## 2014-04-24 ENCOUNTER — Encounter: Payer: Self-pay | Admitting: Internal Medicine

## 2014-04-24 ENCOUNTER — Encounter (INDEPENDENT_AMBULATORY_CARE_PROVIDER_SITE_OTHER): Payer: Self-pay

## 2014-04-24 VITALS — BP 122/74 | HR 66 | Ht 69.0 in | Wt 149.0 lb

## 2014-04-24 DIAGNOSIS — E785 Hyperlipidemia, unspecified: Secondary | ICD-10-CM | POA: Diagnosis not present

## 2014-04-24 DIAGNOSIS — Z79899 Other long term (current) drug therapy: Secondary | ICD-10-CM

## 2014-04-24 DIAGNOSIS — E039 Hypothyroidism, unspecified: Secondary | ICD-10-CM

## 2014-04-24 DIAGNOSIS — I495 Sick sinus syndrome: Secondary | ICD-10-CM

## 2014-04-24 DIAGNOSIS — Z45018 Encounter for adjustment and management of other part of cardiac pacemaker: Secondary | ICD-10-CM | POA: Diagnosis not present

## 2014-04-24 DIAGNOSIS — I480 Paroxysmal atrial fibrillation: Secondary | ICD-10-CM | POA: Diagnosis not present

## 2014-04-24 DIAGNOSIS — I4891 Unspecified atrial fibrillation: Secondary | ICD-10-CM | POA: Diagnosis not present

## 2014-04-24 LAB — MDC_IDC_ENUM_SESS_TYPE_INCLINIC
Battery Voltage: 2.92 V
Brady Statistic AP VP Percent: 0.09 %
Brady Statistic AP VS Percent: 94.89 %
Brady Statistic AS VP Percent: 0.04 %
Brady Statistic AS VS Percent: 4.98 %
Date Time Interrogation Session: 20160126104015
Lead Channel Pacing Threshold Amplitude: 0.5 V
Lead Channel Pacing Threshold Amplitude: 1 V
Lead Channel Pacing Threshold Pulse Width: 0.4 ms
Lead Channel Sensing Intrinsic Amplitude: 1.7248
Lead Channel Sensing Intrinsic Amplitude: 3.3856
Lead Channel Setting Pacing Amplitude: 2 V
Lead Channel Setting Pacing Pulse Width: 0.4 ms
Lead Channel Setting Sensing Sensitivity: 0.9 mV
MDC IDC MSMT LEADCHNL RA IMPEDANCE VALUE: 400 Ohm
MDC IDC MSMT LEADCHNL RV IMPEDANCE VALUE: 416 Ohm
MDC IDC MSMT LEADCHNL RV PACING THRESHOLD PULSEWIDTH: 0.4 ms
MDC IDC SET LEADCHNL RV PACING AMPLITUDE: 2.5 V
MDC IDC SET ZONE DETECTION INTERVAL: 350 ms
MDC IDC SET ZONE DETECTION INTERVAL: 400 ms
MDC IDC STAT BRADY RA PERCENT PACED: 94.98 %
MDC IDC STAT BRADY RV PERCENT PACED: 0.13 %

## 2014-04-24 NOTE — Progress Notes (Signed)
Electrophysiology Office Note   Date:  04/24/2014   ID:  [name] [name], DOB Jun 08, 1922, MRN [id number]  PCP:  [name] Loffler, MD  Cardiologist:  Primary Electrophysiologist:  [name] Axe, MD    No chief complaint on file.    History of Present Illness: [name] [name] is a 81 y.o. male is seen today in followup for  pacemaker implanted for tachybradycardia syndrome in  the context of paroxysmal atrial fibrillation with prior failed ablation. He takes amiodarone.He has hypertension and last year was noted to have orthostasis.  Last TSH  5.11    liver function tests were borderline with AST 42 (<37)  9/15  The patient denies chest pain, shortness of breath, nocturnal dyspnea, orthopnea or peripheral edema. There have been no palpitations, lightheadedness or syncope.    Today, he denies  symptoms of palpitations, chest pain, shortness of breath, orthopnea, PND, lower extremity edema, claudication, dizziness, presyncope, syncope, bleeding, or neurologic sequela. The patient is tolerating medications without difficulties and is otherwise without complaint today.    Past Medical History  Diagnosis Date  . GI hemorrhage   . Radiation proctitis   . 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 Paroxysmal atrial fibrillation     s/p failed ablation  . Cardiac pacemaker in situ 07/2005    symptomatic bradycardia  . Hyperlipidemia   . Hypertension   . GERD (gastroesophageal reflux disease)   . Unspecified glaucoma   . Vitamin B12 deficiency   . Peptic ulcer, unspecified site, unspecified as acute or chronic, without mention of hemorrhage, perforation, or obstruction 1975  . Personal history of malignant neoplasm of prostate 12/2000    5 weeks radiation, radiation seeds  . Diverticulosis of colon (without mention of hemorrhage)   . Unspecified hypothyroidism   . 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 Sinoatrial node dysfunction   . Neck arthritis,  Severe, multi-level 06/09/2013   Past Surgical History  Procedure Laterality Date  . Hernia repair  1994, 2001, 2003    s/p drainage and complications, 123456, [id number] mesh removal  . Insert / replace / remove pacemaker  07/2005    symptomatic bradycardia  . Cardiac electrophysiology mapping and ablation  2001, 2003    failure  . Cataract extraction    . Cardiac catheterization    . Tooth extraction    . Nose surgery      cancer removed      Current Outpatient Prescriptions  Medication Sig Dispense Refill  . alendronate (FOSAMAX) 70 MG tablet Take 1 tablet (70 mg total) by mouth every 7 (seven) days. Take with a full glass of water on an empty stomach. 12 tablet 3  . amiodarone (PACERONE) 200 MG tablet Take 0.5 tablets (100 mg total) by mouth daily. 30 tablet 6  . clindamycin (CLEOCIN) 300 MG capsule Take by mouth. 2 tablets by mouth 1 hour prior to dental procedure    . Cyanocobalamin (VITAMIN B-12 IJ) Inject 1,000 mcg/mL as directed every 30 (thirty) days.      . dorzolamide-timolol (COSOPT) 22.3-6.8 MG/ML ophthalmic solution Place 1 drop into both eyes 2 (two) times daily.      . ferrous sulfate 325 (65 FE) MG EC tablet Take 325 mg by mouth daily with breakfast.      . levothyroxine (SYNTHROID, LEVOTHROID) 100 MCG tablet Take 1 tablet (100 mcg total) by mouth daily. 90 tablet 3  . Multiple Vitamin (MULTIVITAMIN) tablet Take 1 tablet  by mouth daily.      . pantoprazole (PROTONIX) 40 MG tablet Take 1 tablet (40 mg total) by mouth daily. 90 tablet 3  . pravastatin (PRAVACHOL) 40 MG tablet Take 1 tablet (40 mg total) by mouth daily. 90 tablet 3  . torsemide (DEMADEX) 20 MG tablet Take 1 tablet (20 mg total) by mouth as needed. 90 tablet 3  . warfarin (COUMADIN) 4 MG tablet Take as directed by coumadin clinic 50 tablet 1  . Wheat Dextrin (BENEFIBER DRINK MIX PO) Take by mouth as directed.       No current facility-administered medications for this visit.    Allergies:   Penicillins    Social History:  The patient  reports that he has never smoked. He has never used smokeless tobacco. He reports that he does not drink alcohol or use illicit drugs.   Family History:  The patient's    family history includes Alcohol abuse in an other family member; Bone cancer in his father; Breast cancer in his mother; Coronary artery disease in an other family member; Dementia in an other family member; Diabetes in an other family member.    ROS:  Please see the history of present illness and past medical histor .   All other systems are reviewed and negative.    PHYSICAL EXAM: VS:  BP 122/74 mmHg  Pulse 66  Ht 5\' 9"  (1.753 m)  Wt 149 lb (67.586 kg)  BMI 21.99 kg/m2 , BMI Body mass index is 21.99 kg/(m^2). GEN: Well nourished, well developed, in no acute distress HEENT: normal Neck:  JVD flat , carotid bruits, or masses Cardiac: REGULAR RATE and RHYTHM ; 2/6 murmurs, rubs, No S4  Back without kyphosis or CVAT Respiratory:  clear to auscultation bilaterally, normal work of breathing GI: soft, nontender, nondistended, + BS MS: no deformity or atrophy Extremities no clubbing cyanosis  edema Skin: warm and dry,  device pocket is well healed without teathering Neuro:  Strength and sensation are intact Psych: euthymic mood, full affect     Device interrogation is reviewed today in detail.  See PaceArt for details.   Recent Labs: 12/12/2013: ALT 29; BUN 21; Creatinine 1.2; Hemoglobin 12.3*; Platelets 194.0; Potassium 4.3; Sodium 141; TSH 5.11*    Lipid Panel     Component Value Date/Time   CHOL 140 12/12/2013 0820   TRIG 51.0 12/12/2013 0820   HDL 62.20 12/12/2013 0820   CHOLHDL 2 12/12/2013 0820   VLDL 10.2 12/12/2013 0820   LDLCALC 68 12/12/2013 0820     Wt Readings from Last 3 Encounters:  04/24/14 149 lb (67.586 kg)  12/14/13 150 lb 12 oz (68.38 kg)  06/07/13 157 lb 4 oz (71.328 kg)      Other studies Reviewed:    ASSESSMENT AND PLAN:  Atrial  fibrillation  Sinus node dysfunction   Pacemaker  The patient's device was interrogated.  The information was reviewed. No changes were made in the programming.        }     Orders Placed This Encounter  Procedures  . EKG 12-Lead     Disposition:   FU with me 1 year(s)  Signed, [name] Axe, MD  04/24/2014 10:35 AM     [facility] [address] [place] [address] [phone] (office) [phone] (fax)

## 2014-04-24 NOTE — Patient Instructions (Signed)
Your physician recommends that you have labs today:  TSH and Liver function   Your physician wants you to follow-up in: 6 months with Dr. Caryl Comes. You will receive a reminder letter in the mail two months in advance. If you don't receive a letter, please call our office to schedule the follow-up appointment.  Remote monitoring is used to monitor your Pacemaker of ICD from home. This monitoring reduces the number of office visits required to check your device to one time per year. It allows [place] to keep an eye on the functioning of your device to ensure it is working properly. You are scheduled for a device check from home on 07/24/14. You may send your transmission at any time that day. If you have a wireless device, the transmission will be sent automatically. After your physician reviews your transmission, you will receive a postcard with your next transmission date.

## 2014-04-25 ENCOUNTER — Ambulatory Visit (INDEPENDENT_AMBULATORY_CARE_PROVIDER_SITE_OTHER): Payer: Medicare Other

## 2014-04-25 DIAGNOSIS — E538 Deficiency of other specified B group vitamins: Secondary | ICD-10-CM | POA: Diagnosis not present

## 2014-04-25 LAB — HEPATIC FUNCTION PANEL
ALK PHOS: 103 IU/L (ref 39–117)
ALT: 29 IU/L (ref 0–44)
AST: 43 IU/L — AB (ref 0–40)
Albumin: 3.7 g/dL (ref 3.5–4.7)
Bilirubin, Direct: 0.23 mg/dL (ref 0.00–0.40)
Total Bilirubin: 0.7 mg/dL (ref 0.0–1.2)
Total Protein: 6.2 g/dL (ref 6.0–8.5)

## 2014-04-25 LAB — TSH: TSH: 3.61 u[IU]/mL (ref 0.450–4.500)

## 2014-04-25 MED ORDER — CYANOCOBALAMIN 1000 MCG/ML IJ SOLN
1000.0000 ug | Freq: Once | INTRAMUSCULAR | Status: AC
  Administered 2014-04-25: 1000 ug via INTRAMUSCULAR

## 2014-05-02 ENCOUNTER — Ambulatory Visit (INDEPENDENT_AMBULATORY_CARE_PROVIDER_SITE_OTHER): Payer: Medicare Other

## 2014-05-02 DIAGNOSIS — I4891 Unspecified atrial fibrillation: Secondary | ICD-10-CM | POA: Diagnosis not present

## 2014-05-02 DIAGNOSIS — Z7901 Long term (current) use of anticoagulants: Secondary | ICD-10-CM | POA: Diagnosis not present

## 2014-05-02 LAB — POCT INR: INR: 3.3

## 2014-05-23 ENCOUNTER — Ambulatory Visit (INDEPENDENT_AMBULATORY_CARE_PROVIDER_SITE_OTHER): Payer: Medicare Other

## 2014-05-23 DIAGNOSIS — Z7901 Long term (current) use of anticoagulants: Secondary | ICD-10-CM

## 2014-05-23 DIAGNOSIS — I4891 Unspecified atrial fibrillation: Secondary | ICD-10-CM

## 2014-05-23 LAB — POCT INR: INR: 2.4

## 2014-05-29 ENCOUNTER — Ambulatory Visit (INDEPENDENT_AMBULATORY_CARE_PROVIDER_SITE_OTHER): Payer: Medicare Other

## 2014-05-29 DIAGNOSIS — E538 Deficiency of other specified B group vitamins: Secondary | ICD-10-CM

## 2014-05-29 MED ORDER — CYANOCOBALAMIN 1000 MCG/ML IJ SOLN
1000.0000 ug | Freq: Once | INTRAMUSCULAR | Status: AC
  Administered 2014-05-29: 1000 ug via INTRAMUSCULAR

## 2014-06-06 DIAGNOSIS — H4011X2 Primary open-angle glaucoma, moderate stage: Secondary | ICD-10-CM | POA: Diagnosis not present

## 2014-06-19 ENCOUNTER — Other Ambulatory Visit: Payer: Self-pay | Admitting: *Deleted

## 2014-06-19 MED ORDER — AMIODARONE HCL 200 MG PO TABS: 100.0000 mg | ORAL_TABLET | Freq: Every day | ORAL | Status: DC

## 2014-06-19 NOTE — Telephone Encounter (Signed)
Ok to refill 30, 5 refills 

## 2014-06-19 NOTE — Telephone Encounter (Signed)
Patient came by the office and requested a refill on his Amiodarone. See drug warning with Warfarin Is it okay to refill medication?

## 2014-06-19 NOTE — Telephone Encounter (Signed)
He has an Fish farm manager. If Dr. Caryl Comes thinks it is fine, then it is ok.

## 2014-06-19 NOTE — Telephone Encounter (Signed)
Sent in #90 with 1 refill since it was going to mail order.

## 2014-06-20 ENCOUNTER — Ambulatory Visit (INDEPENDENT_AMBULATORY_CARE_PROVIDER_SITE_OTHER): Payer: Medicare Other

## 2014-06-20 DIAGNOSIS — I4891 Unspecified atrial fibrillation: Secondary | ICD-10-CM

## 2014-06-20 DIAGNOSIS — Z7901 Long term (current) use of anticoagulants: Secondary | ICD-10-CM | POA: Diagnosis not present

## 2014-06-20 LAB — POCT INR: INR: 1.7

## 2014-06-29 ENCOUNTER — Ambulatory Visit (INDEPENDENT_AMBULATORY_CARE_PROVIDER_SITE_OTHER): Payer: Medicare Other | Admitting: *Deleted

## 2014-06-29 DIAGNOSIS — E538 Deficiency of other specified B group vitamins: Secondary | ICD-10-CM

## 2014-06-29 MED ORDER — CYANOCOBALAMIN 1000 MCG/ML IJ SOLN
1000.0000 ug | Freq: Once | INTRAMUSCULAR | Status: AC
  Administered 2014-06-29: 1000 ug via INTRAMUSCULAR

## 2014-07-11 ENCOUNTER — Ambulatory Visit (INDEPENDENT_AMBULATORY_CARE_PROVIDER_SITE_OTHER): Payer: Medicare Other

## 2014-07-11 DIAGNOSIS — Z7901 Long term (current) use of anticoagulants: Secondary | ICD-10-CM

## 2014-07-11 DIAGNOSIS — I4891 Unspecified atrial fibrillation: Secondary | ICD-10-CM

## 2014-07-11 LAB — POCT INR: INR: 1.6

## 2014-07-24 ENCOUNTER — Ambulatory Visit (INDEPENDENT_AMBULATORY_CARE_PROVIDER_SITE_OTHER): Payer: Medicare Other | Admitting: *Deleted

## 2014-07-24 ENCOUNTER — Encounter: Payer: Self-pay | Admitting: Internal Medicine

## 2014-07-24 DIAGNOSIS — I495 Sick sinus syndrome: Secondary | ICD-10-CM | POA: Diagnosis not present

## 2014-07-24 NOTE — Progress Notes (Signed)
Remote pacemaker transmission.   

## 2014-07-25 ENCOUNTER — Ambulatory Visit (INDEPENDENT_AMBULATORY_CARE_PROVIDER_SITE_OTHER): Payer: Medicare Other | Admitting: *Deleted

## 2014-07-25 DIAGNOSIS — Z7901 Long term (current) use of anticoagulants: Secondary | ICD-10-CM | POA: Diagnosis not present

## 2014-07-25 DIAGNOSIS — I4891 Unspecified atrial fibrillation: Secondary | ICD-10-CM

## 2014-07-25 LAB — POCT INR: INR: 1.8

## 2014-07-26 LAB — MDC_IDC_ENUM_SESS_TYPE_REMOTE
Battery Voltage: 2.88 V
Brady Statistic AP VP Percent: 1.21 %
Brady Statistic AP VS Percent: 94.92 %
Brady Statistic AS VP Percent: 0.17 %
Brady Statistic RV Percent Paced: 1.38 %
Date Time Interrogation Session: 20160426122433
Lead Channel Impedance Value: 392 Ohm
Lead Channel Impedance Value: 552 Ohm
Lead Channel Sensing Intrinsic Amplitude: 2.199 mV
Lead Channel Sensing Intrinsic Amplitude: 3.047 mV
Lead Channel Setting Pacing Amplitude: 2 V
Lead Channel Setting Pacing Amplitude: 2.5 V
Lead Channel Setting Pacing Pulse Width: 0.4 ms
Lead Channel Setting Sensing Sensitivity: 0.9 mV
MDC IDC STAT BRADY AS VS PERCENT: 3.69 %
MDC IDC STAT BRADY RA PERCENT PACED: 96.13 %
Zone Setting Detection Interval: 350 ms
Zone Setting Detection Interval: 400 ms

## 2014-07-27 ENCOUNTER — Telehealth: Payer: Self-pay | Admitting: Family Medicine

## 2014-07-27 MED ORDER — PRAVASTATIN SODIUM 40 MG PO TABS: 40.0000 mg | ORAL_TABLET | Freq: Every day | ORAL | Status: DC

## 2014-07-27 NOTE — Telephone Encounter (Signed)
Patient need new prescription Pravastatin 40 mg, [facility]

## 2014-07-31 ENCOUNTER — Ambulatory Visit (INDEPENDENT_AMBULATORY_CARE_PROVIDER_SITE_OTHER): Payer: Medicare Other | Admitting: *Deleted

## 2014-07-31 DIAGNOSIS — E538 Deficiency of other specified B group vitamins: Secondary | ICD-10-CM | POA: Diagnosis not present

## 2014-07-31 MED ORDER — CYANOCOBALAMIN 1000 MCG/ML IJ SOLN
1000.0000 ug | Freq: Once | INTRAMUSCULAR | Status: AC
  Administered 2014-07-31: 1000 ug via INTRAMUSCULAR

## 2014-08-08 ENCOUNTER — Ambulatory Visit (INDEPENDENT_AMBULATORY_CARE_PROVIDER_SITE_OTHER): Payer: Medicare Other

## 2014-08-08 DIAGNOSIS — Z7901 Long term (current) use of anticoagulants: Secondary | ICD-10-CM

## 2014-08-08 DIAGNOSIS — I4891 Unspecified atrial fibrillation: Secondary | ICD-10-CM

## 2014-08-17 ENCOUNTER — Encounter: Payer: Self-pay | Admitting: Cardiology

## 2014-08-28 DIAGNOSIS — Z6824 Body mass index (BMI) 24.0-24.9, adult: Secondary | ICD-10-CM | POA: Diagnosis not present

## 2014-08-28 DIAGNOSIS — N390 Urinary tract infection, site not specified: Secondary | ICD-10-CM | POA: Diagnosis not present

## 2014-08-28 DIAGNOSIS — H409 Unspecified glaucoma: Secondary | ICD-10-CM | POA: Diagnosis not present

## 2014-08-28 DIAGNOSIS — Z79899 Other long term (current) drug therapy: Secondary | ICD-10-CM | POA: Diagnosis not present

## 2014-08-28 DIAGNOSIS — Z8546 Personal history of malignant neoplasm of prostate: Secondary | ICD-10-CM | POA: Diagnosis not present

## 2014-08-28 DIAGNOSIS — Z950 Presence of cardiac pacemaker: Secondary | ICD-10-CM | POA: Diagnosis not present

## 2014-08-28 DIAGNOSIS — E039 Hypothyroidism, unspecified: Secondary | ICD-10-CM | POA: Diagnosis not present

## 2014-08-28 DIAGNOSIS — Z923 Personal history of irradiation: Secondary | ICD-10-CM | POA: Diagnosis not present

## 2014-08-28 DIAGNOSIS — Z880 Allergy status to penicillin: Secondary | ICD-10-CM | POA: Diagnosis not present

## 2014-08-29 ENCOUNTER — Ambulatory Visit (INDEPENDENT_AMBULATORY_CARE_PROVIDER_SITE_OTHER): Payer: Medicare Other

## 2014-08-29 DIAGNOSIS — I4891 Unspecified atrial fibrillation: Secondary | ICD-10-CM

## 2014-08-29 DIAGNOSIS — Z7901 Long term (current) use of anticoagulants: Secondary | ICD-10-CM | POA: Diagnosis not present

## 2014-08-29 LAB — POCT INR: INR: 2.6

## 2014-08-31 ENCOUNTER — Ambulatory Visit (INDEPENDENT_AMBULATORY_CARE_PROVIDER_SITE_OTHER): Payer: Medicare Other

## 2014-08-31 DIAGNOSIS — E538 Deficiency of other specified B group vitamins: Secondary | ICD-10-CM

## 2014-08-31 MED ORDER — CYANOCOBALAMIN 1000 MCG/ML IJ SOLN
1000.0000 ug | Freq: Once | INTRAMUSCULAR | Status: AC
  Administered 2014-08-31: 1000 ug via INTRAMUSCULAR

## 2014-09-26 ENCOUNTER — Ambulatory Visit (INDEPENDENT_AMBULATORY_CARE_PROVIDER_SITE_OTHER): Payer: Medicare Other

## 2014-09-26 DIAGNOSIS — Z7901 Long term (current) use of anticoagulants: Secondary | ICD-10-CM

## 2014-09-26 DIAGNOSIS — I4891 Unspecified atrial fibrillation: Secondary | ICD-10-CM

## 2014-09-26 LAB — POCT INR: INR: 2.3

## 2014-10-03 ENCOUNTER — Ambulatory Visit (INDEPENDENT_AMBULATORY_CARE_PROVIDER_SITE_OTHER): Payer: Medicare Other

## 2014-10-03 DIAGNOSIS — E538 Deficiency of other specified B group vitamins: Secondary | ICD-10-CM | POA: Diagnosis not present

## 2014-10-03 MED ORDER — CYANOCOBALAMIN 1000 MCG/ML IJ SOLN
1000.0000 ug | Freq: Once | INTRAMUSCULAR | Status: AC
  Administered 2014-10-03: 1000 ug via INTRAMUSCULAR

## 2014-10-11 DIAGNOSIS — N390 Urinary tract infection, site not specified: Secondary | ICD-10-CM | POA: Diagnosis not present

## 2014-10-18 DIAGNOSIS — R312 Other microscopic hematuria: Secondary | ICD-10-CM | POA: Diagnosis not present

## 2014-10-18 DIAGNOSIS — Z8546 Personal history of malignant neoplasm of prostate: Secondary | ICD-10-CM | POA: Diagnosis not present

## 2014-10-18 DIAGNOSIS — N210 Calculus in bladder: Secondary | ICD-10-CM | POA: Diagnosis not present

## 2014-10-23 ENCOUNTER — Ambulatory Visit (INDEPENDENT_AMBULATORY_CARE_PROVIDER_SITE_OTHER): Payer: Medicare Other | Admitting: *Deleted

## 2014-10-23 DIAGNOSIS — I495 Sick sinus syndrome: Secondary | ICD-10-CM

## 2014-10-23 DIAGNOSIS — Z950 Presence of cardiac pacemaker: Secondary | ICD-10-CM | POA: Diagnosis not present

## 2014-10-24 DIAGNOSIS — Z85828 Personal history of other malignant neoplasm of skin: Secondary | ICD-10-CM | POA: Diagnosis not present

## 2014-10-24 DIAGNOSIS — Z1283 Encounter for screening for malignant neoplasm of skin: Secondary | ICD-10-CM | POA: Diagnosis not present

## 2014-10-24 DIAGNOSIS — Z872 Personal history of diseases of the skin and subcutaneous tissue: Secondary | ICD-10-CM | POA: Diagnosis not present

## 2014-10-24 DIAGNOSIS — L570 Actinic keratosis: Secondary | ICD-10-CM | POA: Diagnosis not present

## 2014-10-24 DIAGNOSIS — C44319 Basal cell carcinoma of skin of other parts of face: Secondary | ICD-10-CM | POA: Diagnosis not present

## 2014-10-24 DIAGNOSIS — C44219 Basal cell carcinoma of skin of left ear and external auricular canal: Secondary | ICD-10-CM | POA: Diagnosis not present

## 2014-10-24 DIAGNOSIS — Z08 Encounter for follow-up examination after completed treatment for malignant neoplasm: Secondary | ICD-10-CM | POA: Diagnosis not present

## 2014-10-24 DIAGNOSIS — D485 Neoplasm of uncertain behavior of skin: Secondary | ICD-10-CM | POA: Diagnosis not present

## 2014-10-26 DIAGNOSIS — K868 Other specified diseases of pancreas: Secondary | ICD-10-CM | POA: Diagnosis not present

## 2014-10-26 DIAGNOSIS — K5730 Diverticulosis of large intestine without perforation or abscess without bleeding: Secondary | ICD-10-CM | POA: Diagnosis not present

## 2014-10-26 DIAGNOSIS — N261 Atrophy of kidney (terminal): Secondary | ICD-10-CM | POA: Diagnosis not present

## 2014-10-26 DIAGNOSIS — N2889 Other specified disorders of kidney and ureter: Secondary | ICD-10-CM | POA: Diagnosis not present

## 2014-10-30 DIAGNOSIS — K219 Gastro-esophageal reflux disease without esophagitis: Secondary | ICD-10-CM | POA: Diagnosis not present

## 2014-10-30 DIAGNOSIS — I4891 Unspecified atrial fibrillation: Secondary | ICD-10-CM | POA: Diagnosis not present

## 2014-10-30 DIAGNOSIS — E039 Hypothyroidism, unspecified: Secondary | ICD-10-CM | POA: Diagnosis not present

## 2014-10-30 DIAGNOSIS — Z01818 Encounter for other preprocedural examination: Secondary | ICD-10-CM | POA: Diagnosis not present

## 2014-10-31 ENCOUNTER — Ambulatory Visit (INDEPENDENT_AMBULATORY_CARE_PROVIDER_SITE_OTHER): Payer: Medicare Other | Admitting: *Deleted

## 2014-10-31 DIAGNOSIS — I4891 Unspecified atrial fibrillation: Secondary | ICD-10-CM | POA: Diagnosis not present

## 2014-10-31 DIAGNOSIS — Z7901 Long term (current) use of anticoagulants: Secondary | ICD-10-CM

## 2014-10-31 LAB — POCT INR: INR: 2.1

## 2014-11-01 ENCOUNTER — Telehealth: Payer: Self-pay | Admitting: *Deleted

## 2014-11-01 NOTE — Telephone Encounter (Signed)
Forward to [employer]

## 2014-11-01 NOTE — Telephone Encounter (Signed)
Request for surgical clearance:  1. What type of surgery is being performed? Bladder stones removal and possibly biopsy   2. When is this surgery scheduled? 11/07/14  3. Are there any medications that need to be held prior to surgery and how long? Coumadin for about 3-5 day before   4. Name of physician performing surgery? Dr Darlys Gales   5. What is your office phone and fax number? Fax: [phone] [facility] [place]   6.

## 2014-11-01 NOTE — Telephone Encounter (Signed)
Pt called back, instructed him to take last Coumadin dose today for Urologic procedure on 11/07/14, he verbalized understanding. Instructed him to restart post procedure per Dr Theresia Bough instructions and/or call us to clarification. Call also if placed on any new meds.

## 2014-11-01 NOTE — Telephone Encounter (Signed)
Pt has a CHADS score of 2.  Per protocol, okay to hold Coumadin x 5 days prior to procedure.

## 2014-11-06 ENCOUNTER — Ambulatory Visit (INDEPENDENT_AMBULATORY_CARE_PROVIDER_SITE_OTHER): Payer: Medicare Other

## 2014-11-06 DIAGNOSIS — E538 Deficiency of other specified B group vitamins: Secondary | ICD-10-CM | POA: Diagnosis not present

## 2014-11-06 LAB — CUP PACEART REMOTE DEVICE CHECK
Brady Statistic AP VP Percent: 1.66 %
Brady Statistic AP VS Percent: 93.18 %
Brady Statistic AS VP Percent: 0.28 %
Brady Statistic AS VS Percent: 4.88 %
Brady Statistic RA Percent Paced: 94.84 %
Brady Statistic RV Percent Paced: 1.94 %
Date Time Interrogation Session: 20160726154751
Lead Channel Impedance Value: 376 Ohm
Lead Channel Impedance Value: 376 Ohm
Lead Channel Sensing Intrinsic Amplitude: 2.027 mV
Lead Channel Setting Pacing Amplitude: 2 V
Lead Channel Setting Pacing Pulse Width: 0.4 ms
Lead Channel Setting Sensing Sensitivity: 0.9 mV
MDC IDC MSMT BATTERY VOLTAGE: 2.89 V
MDC IDC SET LEADCHNL RV PACING AMPLITUDE: 2.5 V
MDC IDC SET ZONE DETECTION INTERVAL: 350 ms
Zone Setting Detection Interval: 400 ms

## 2014-11-06 MED ORDER — CYANOCOBALAMIN 1000 MCG/ML IJ SOLN
1000.0000 ug | Freq: Once | INTRAMUSCULAR | Status: AC
  Administered 2014-11-06: 1000 ug via INTRAMUSCULAR

## 2014-11-07 ENCOUNTER — Other Ambulatory Visit: Payer: Self-pay

## 2014-11-07 DIAGNOSIS — Z8711 Personal history of peptic ulcer disease: Secondary | ICD-10-CM | POA: Diagnosis not present

## 2014-11-07 DIAGNOSIS — Z950 Presence of cardiac pacemaker: Secondary | ICD-10-CM | POA: Diagnosis not present

## 2014-11-07 DIAGNOSIS — T191XXA Foreign body in bladder, initial encounter: Secondary | ICD-10-CM | POA: Diagnosis not present

## 2014-11-07 DIAGNOSIS — R312 Other microscopic hematuria: Secondary | ICD-10-CM | POA: Diagnosis not present

## 2014-11-07 DIAGNOSIS — N359 Urethral stricture, unspecified: Secondary | ICD-10-CM | POA: Diagnosis not present

## 2014-11-07 DIAGNOSIS — E039 Hypothyroidism, unspecified: Secondary | ICD-10-CM | POA: Diagnosis not present

## 2014-11-07 DIAGNOSIS — N210 Calculus in bladder: Secondary | ICD-10-CM | POA: Diagnosis not present

## 2014-11-07 DIAGNOSIS — Z7901 Long term (current) use of anticoagulants: Secondary | ICD-10-CM | POA: Diagnosis not present

## 2014-11-07 DIAGNOSIS — N3289 Other specified disorders of bladder: Secondary | ICD-10-CM | POA: Diagnosis not present

## 2014-11-07 DIAGNOSIS — I4891 Unspecified atrial fibrillation: Secondary | ICD-10-CM | POA: Diagnosis not present

## 2014-11-07 DIAGNOSIS — N400 Enlarged prostate without lower urinary tract symptoms: Secondary | ICD-10-CM | POA: Diagnosis not present

## 2014-11-07 DIAGNOSIS — N390 Urinary tract infection, site not specified: Secondary | ICD-10-CM | POA: Diagnosis not present

## 2014-11-07 DIAGNOSIS — H409 Unspecified glaucoma: Secondary | ICD-10-CM | POA: Diagnosis not present

## 2014-11-07 DIAGNOSIS — Z880 Allergy status to penicillin: Secondary | ICD-10-CM | POA: Diagnosis not present

## 2014-11-07 DIAGNOSIS — T8589XA Other specified complication of internal prosthetic devices, implants and grafts, not elsewhere classified, initial encounter: Secondary | ICD-10-CM | POA: Diagnosis not present

## 2014-11-07 DIAGNOSIS — Z8546 Personal history of malignant neoplasm of prostate: Secondary | ICD-10-CM | POA: Diagnosis not present

## 2014-11-07 DIAGNOSIS — K219 Gastro-esophageal reflux disease without esophagitis: Secondary | ICD-10-CM | POA: Diagnosis not present

## 2014-11-07 DIAGNOSIS — Z79899 Other long term (current) drug therapy: Secondary | ICD-10-CM | POA: Diagnosis not present

## 2014-11-07 HISTORY — PX: CYSTOURETHROSCOPY: SHX476

## 2014-11-07 MED ORDER — PANTOPRAZOLE SODIUM 40 MG PO TBEC: 40.0000 mg | DELAYED_RELEASE_TABLET | Freq: Every day | ORAL | Status: DC

## 2014-11-07 NOTE — Telephone Encounter (Signed)
Pt left note requesting refill pantoprazole; refill done per protocol and pt notified done.

## 2014-11-08 DIAGNOSIS — T191XXD Foreign body in bladder, subsequent encounter: Secondary | ICD-10-CM | POA: Diagnosis not present

## 2014-11-08 DIAGNOSIS — Z6820 Body mass index (BMI) 20.0-20.9, adult: Secondary | ICD-10-CM | POA: Diagnosis not present

## 2014-11-19 DIAGNOSIS — Z6820 Body mass index (BMI) 20.0-20.9, adult: Secondary | ICD-10-CM | POA: Diagnosis not present

## 2014-11-19 DIAGNOSIS — T191XXD Foreign body in bladder, subsequent encounter: Secondary | ICD-10-CM | POA: Diagnosis not present

## 2014-11-19 DIAGNOSIS — C44219 Basal cell carcinoma of skin of left ear and external auricular canal: Secondary | ICD-10-CM | POA: Diagnosis not present

## 2014-11-21 ENCOUNTER — Ambulatory Visit (INDEPENDENT_AMBULATORY_CARE_PROVIDER_SITE_OTHER): Payer: Medicare Other | Admitting: Pharmacist

## 2014-11-21 DIAGNOSIS — I4891 Unspecified atrial fibrillation: Secondary | ICD-10-CM | POA: Diagnosis not present

## 2014-11-21 DIAGNOSIS — Z7901 Long term (current) use of anticoagulants: Secondary | ICD-10-CM

## 2014-11-21 LAB — POCT INR: INR: 1.6

## 2014-11-26 DIAGNOSIS — C44219 Basal cell carcinoma of skin of left ear and external auricular canal: Secondary | ICD-10-CM | POA: Diagnosis not present

## 2014-11-29 ENCOUNTER — Encounter: Payer: Self-pay | Admitting: Cardiology

## 2014-12-10 DIAGNOSIS — C44319 Basal cell carcinoma of skin of other parts of face: Secondary | ICD-10-CM | POA: Diagnosis not present

## 2014-12-11 ENCOUNTER — Ambulatory Visit (INDEPENDENT_AMBULATORY_CARE_PROVIDER_SITE_OTHER): Payer: Medicare Other

## 2014-12-11 DIAGNOSIS — E538 Deficiency of other specified B group vitamins: Secondary | ICD-10-CM | POA: Diagnosis not present

## 2014-12-11 MED ORDER — CYANOCOBALAMIN 1000 MCG/ML IJ SOLN
1000.0000 ug | Freq: Once | INTRAMUSCULAR | Status: AC
  Administered 2014-12-11: 1000 ug via INTRAMUSCULAR

## 2014-12-12 ENCOUNTER — Ambulatory Visit (INDEPENDENT_AMBULATORY_CARE_PROVIDER_SITE_OTHER): Payer: Medicare Other

## 2014-12-12 ENCOUNTER — Encounter: Payer: Self-pay | Admitting: Internal Medicine

## 2014-12-12 DIAGNOSIS — I4891 Unspecified atrial fibrillation: Secondary | ICD-10-CM

## 2014-12-12 DIAGNOSIS — Z7901 Long term (current) use of anticoagulants: Secondary | ICD-10-CM | POA: Diagnosis not present

## 2014-12-12 LAB — POCT INR: INR: 2.3

## 2014-12-12 MED ORDER — WARFARIN SODIUM 4 MG PO TABS: ORAL_TABLET | ORAL | Status: DC

## 2015-01-09 ENCOUNTER — Ambulatory Visit (INDEPENDENT_AMBULATORY_CARE_PROVIDER_SITE_OTHER): Payer: Medicare Other

## 2015-01-09 DIAGNOSIS — I4891 Unspecified atrial fibrillation: Secondary | ICD-10-CM | POA: Diagnosis not present

## 2015-01-09 DIAGNOSIS — Z7901 Long term (current) use of anticoagulants: Secondary | ICD-10-CM | POA: Diagnosis not present

## 2015-01-09 LAB — POCT INR: INR: 3.2

## 2015-01-11 ENCOUNTER — Other Ambulatory Visit: Payer: Self-pay

## 2015-01-11 MED ORDER — ALENDRONATE SODIUM 70 MG PO TABS: 70.0000 mg | ORAL_TABLET | ORAL | Status: DC

## 2015-01-11 NOTE — Telephone Encounter (Signed)
Pt left note requesting rx for alendronate to [facility]. Pt last annual 12/14/13;no future appt scheduled. Per DPR left v/m advising pt refilled # 12 and pt needs to cb and schedule appt prior to running out of med.

## 2015-01-15 ENCOUNTER — Ambulatory Visit (INDEPENDENT_AMBULATORY_CARE_PROVIDER_SITE_OTHER): Payer: Medicare Other | Admitting: *Deleted

## 2015-01-15 DIAGNOSIS — E538 Deficiency of other specified B group vitamins: Secondary | ICD-10-CM

## 2015-01-15 MED ORDER — CYANOCOBALAMIN 1000 MCG/ML IJ SOLN
1000.0000 ug | Freq: Once | INTRAMUSCULAR | Status: AC
  Administered 2015-01-15: 1000 ug via INTRAMUSCULAR

## 2015-01-21 ENCOUNTER — Other Ambulatory Visit (INDEPENDENT_AMBULATORY_CARE_PROVIDER_SITE_OTHER): Payer: Medicare Other

## 2015-01-21 DIAGNOSIS — Z79899 Other long term (current) drug therapy: Secondary | ICD-10-CM

## 2015-01-21 DIAGNOSIS — E538 Deficiency of other specified B group vitamins: Secondary | ICD-10-CM | POA: Diagnosis not present

## 2015-01-21 DIAGNOSIS — E039 Hypothyroidism, unspecified: Secondary | ICD-10-CM

## 2015-01-21 DIAGNOSIS — D509 Iron deficiency anemia, unspecified: Secondary | ICD-10-CM

## 2015-01-21 DIAGNOSIS — E785 Hyperlipidemia, unspecified: Secondary | ICD-10-CM

## 2015-01-21 DIAGNOSIS — E559 Vitamin D deficiency, unspecified: Secondary | ICD-10-CM

## 2015-01-21 LAB — BASIC METABOLIC PANEL
BUN: 21 mg/dL (ref 6–23)
CHLORIDE: 104 meq/L (ref 96–112)
CO2: 33 mEq/L — ABNORMAL HIGH (ref 19–32)
Calcium: 9.5 mg/dL (ref 8.4–10.5)
Creatinine, Ser: 1.34 mg/dL (ref 0.40–1.50)
GFR: 53.25 mL/min — AB (ref >60.00)
GLUCOSE: 102 mg/dL — AB (ref 70–99)
POTASSIUM: 4.3 meq/L (ref 3.5–5.1)
SODIUM: 143 meq/L (ref 135–145)

## 2015-01-21 LAB — T3, FREE: T3, Free: 2.3 pg/mL (ref 2.3–4.2)

## 2015-01-21 LAB — TSH: TSH: 8.77 u[IU]/mL — AB (ref 0.35–4.50)

## 2015-01-21 LAB — CBC WITH DIFFERENTIAL/PLATELET
BASOS PCT: 1.1 % (ref 0.0–3.0)
Basophils Absolute: 0.1 10*3/uL (ref 0.0–0.1)
EOS PCT: 3.2 % (ref 0.0–5.0)
Eosinophils Absolute: 0.2 10*3/uL (ref 0.0–0.7)
HEMATOCRIT: 38.8 % — AB (ref 39.0–52.0)
Hemoglobin: 12.7 g/dL — ABNORMAL LOW (ref 13.0–17.0)
Lymphocytes Relative: 21.5 % (ref 12.0–46.0)
Lymphs Abs: 1.2 10*3/uL (ref 0.7–4.0)
MCHC: 32.6 g/dL (ref 30.0–36.0)
MCV: 98 fl (ref 78.0–100.0)
MONOS PCT: 9.7 % (ref 3.0–12.0)
Monocytes Absolute: 0.5 10*3/uL (ref 0.1–1.0)
Neutro Abs: 3.6 10*3/uL (ref 1.4–7.7)
Neutrophils Relative %: 64.5 % (ref 43.0–77.0)
PLATELETS: 212 10*3/uL (ref 150.0–400.0)
RBC: 3.96 Mil/uL — AB (ref 4.22–5.81)
RDW: 14.1 % (ref 11.5–15.5)
WBC: 5.5 10*3/uL (ref 4.0–10.5)

## 2015-01-21 LAB — HEPATIC FUNCTION PANEL
ALT: 28 U/L (ref 0–53)
AST: 33 U/L (ref 0–37)
Albumin: 3.7 g/dL (ref 3.5–5.2)
Alkaline Phosphatase: 99 U/L (ref 39–117)
BILIRUBIN DIRECT: 0.2 mg/dL (ref 0.0–0.3)
BILIRUBIN TOTAL: 1 mg/dL (ref 0.2–1.2)
TOTAL PROTEIN: 6.4 g/dL (ref 6.0–8.3)

## 2015-01-21 LAB — LIPID PANEL
CHOL/HDL RATIO: 2
CHOLESTEROL: 151 mg/dL (ref 0–200)
HDL: 65.6 mg/dL (ref >39.00)
LDL CALC: 72 mg/dL (ref 0–99)
NonHDL: 85.8
Triglycerides: 71 mg/dL (ref 0.0–149.0)
VLDL: 14.2 mg/dL (ref 0.0–40.0)

## 2015-01-21 LAB — FERRITIN: Ferritin: 148.1 ng/mL (ref 22.0–322.0)

## 2015-01-21 LAB — VITAMIN B12: VITAMIN B 12: 718 pg/mL (ref 211–911)

## 2015-01-21 LAB — VITAMIN D 25 HYDROXY (VIT D DEFICIENCY, FRACTURES): VITD: 37.88 ng/mL (ref 30.00–100.00)

## 2015-01-21 LAB — T4, FREE: Free T4: 0.89 ng/dL (ref 0.60–1.60)

## 2015-01-22 ENCOUNTER — Ambulatory Visit (INDEPENDENT_AMBULATORY_CARE_PROVIDER_SITE_OTHER): Payer: Medicare Other | Admitting: *Deleted

## 2015-01-22 DIAGNOSIS — I495 Sick sinus syndrome: Secondary | ICD-10-CM

## 2015-01-23 NOTE — Progress Notes (Signed)
Remote pacemaker transmission.   

## 2015-01-24 ENCOUNTER — Ambulatory Visit (INDEPENDENT_AMBULATORY_CARE_PROVIDER_SITE_OTHER): Payer: Medicare Other | Admitting: Family Medicine

## 2015-01-24 ENCOUNTER — Encounter: Payer: Self-pay | Admitting: Family Medicine

## 2015-01-24 VITALS — BP 130/82 | HR 69 | Temp 97.5°F | Ht 69.25 in | Wt 146.2 lb

## 2015-01-24 DIAGNOSIS — Z0000 Encounter for general adult medical examination without abnormal findings: Secondary | ICD-10-CM

## 2015-01-24 NOTE — Progress Notes (Signed)
Pre visit review using our clinic review tool, if applicable. No additional management support is needed unless otherwise documented below in the visit note. 

## 2015-01-24 NOTE — Progress Notes (Signed)
Dr. Frederico Hamman T. Wallice Granville, MD, [facility] [place], [address] Phone: [phone] Fax: [phone]  01/24/2015  Patient: [name] [name], MRN: [id number], DOB: January 17, 1923, 81 y.o.  Primary Physician:  Owens Loffler, MD   Chief Complaint  Patient presents with  . Annual Exam    Medicare Wellness   Subjective:   [name] [name] is a 81 y.o. pleasant patient who presents for a medicare wellness examination:  Preventative Health Maintenance Visit:  Health Maintenance Summary Reviewed and updated, unless pt declines services.  Tobacco History Reviewed. Alcohol: No concerns, no excessive use Exercise Habits: as much as he based on age, rec at least 30 mins 5 times a week STD concerns: no risk or activity to increase risk Drug Use: None Encouraged self-testicular check  Health Maintenance  Topic Date Due  . TETANUS/TDAP  03/30/2013  . INFLUENZA VACCINE  10/29/2014  . ZOSTAVAX  Completed  . PNA vac Low Risk Adult  Completed    Immunization History  Administered Date(s) Administered  . H1N1 05/09/2008  . Influenza Split 02/10/2011  . Influenza Whole 01/15/2009, 01/13/2010  . Influenza, High Dose Seasonal PF 12/21/2012  . Influenza,inj,Quad PF,36+ Mos 01/08/2014, 01/25/2015  . Pneumococcal Conjugate-13 12/14/2013  . Pneumococcal Polysaccharide-23 03/31/2003, 03/30/2005  . Td 03/31/2003  . Zoster 02/06/2008    Patient Active Problem List   Diagnosis Date Noted  . PACEMAKER, PERMANENT 08/07/2008    Priority: Medium  . PAROXYSMAL ATRIAL FIBRILLATION 02/06/2008    Priority: Medium  . DDD (degenerative disc disease), cervical, Severe 06/09/2013  . Neck arthritis, Severe, multi-level 06/09/2013  . Sinoatrial node dysfunction ([name])   . Long term (current) use of anticoagulants 06/24/2010  . IRON DEFICIENCY 04/17/2010  . GLAUCOMA 08/07/2008  . GERD 08/07/2008  . Vitamin B 12 deficiency 07/19/2008  . PEPTIC  ULCER DISEASE 07/18/2008  . RADIATION PROCTITIS 07/18/2008  . DIVERTICULOSIS, COLON 07/18/2008  . Prostate cancer, in remission 07/18/2008  . HYPERTENSION, ESSENTIAL NOS 05/09/2008  . Hyperlipidemia 02/06/2008  . HYPOTHYROIDISM 12/01/2007  . HERN UNS SITE ABD CAV W/O MENTION OBST/GANGREN 12/01/2007   Past Medical History  Diagnosis Date  . GI hemorrhage   . Radiation proctitis   . 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 Paroxysmal atrial fibrillation ([name])     s/p failed ablation  . Cardiac pacemaker in situ 07/2005    symptomatic bradycardia  . Hyperlipidemia   . Hypertension   . GERD (gastroesophageal reflux disease)   . Unspecified glaucoma   . Vitamin B12 deficiency   . Peptic ulcer, unspecified site, unspecified as acute or chronic, without mention of hemorrhage, perforation, or obstruction 1975  . Personal history of malignant neoplasm of prostate 12/2000    5 weeks radiation, radiation seeds  . Diverticulosis of colon (without mention of hemorrhage)   . Unspecified hypothyroidism   . 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 Sinoatrial node dysfunction ([name])   . Neck arthritis, Severe, multi-level 06/09/2013   Past Surgical History  Procedure Laterality Date  . Hernia repair  1994, 2001, 2003    s/p drainage and complications, 05/4194, 05/2295 mesh removal  . Insert / replace / remove pacemaker  07/2005    symptomatic bradycardia  . Cardiac electrophysiology mapping and ablation  2001, 2003    failure  . Cataract extraction    . Cardiac catheterization    . Tooth extraction    .  Nose surgery      cancer removed    Social History   Social History  . Marital Status: Widowed    Spouse Name: N/A  . Number of Children: N/A  . Years of Education: N/A   Occupational History  . Not on file.   Social History Main Topics  . Smoking status: Never Smoker   . Smokeless tobacco: Never Used  . Alcohol Use: No  . Drug Use: No  . Sexual  Activity: Not on file   Other Topics Concern  . Not on file   Social History Narrative   Family History  Problem Relation Age of Onset  . Breast cancer Mother   . Bone cancer Father   . Alcohol abuse    . Coronary artery disease    . Dementia    . Diabetes     Allergies  Allergen Reactions  . Penicillins    Medication list has been reviewed and updated.  General: Denies fever, chills, sweats. No significant weight loss. Eyes: Denies blurring,significant itching ENT: Denies earache, sore throat, and hoarseness. Cardiovascular: Denies chest pains, palpitations, dyspnea on exertion Respiratory: Denies cough, dyspnea at rest,wheeezing Breast: no concerns about lumps GI: Denies nausea, vomiting, diarrhea, constipation, change in bowel habits, abdominal pain, melena, hematochezia GU: Denies penile discharge, ED, urinary flow / outflow problems. No STD concerns. Musculoskeletal: Denies back pain, joint pain Derm: Denies rash, itching Neuro: Denies  paresthesias, frequent falls, frequent headaches Psych: Denies depression, anxiety Endocrine: Denies cold intolerance, heat intolerance, polydipsia Heme: Denies enlarged lymph nodes Allergy: No hayfever  Objective:   BP 130/82 mmHg  Pulse 69  Temp(Src) 97.5 F (36.4 C) (Oral)  Ht 5' 9.25" (1.759 m)  Wt 146 lb 4 oz (66.339 kg)  BMI 21.44 kg/m2  The patient completed a fall screen and PHQ-2 and PHQ-9 if necessary, which is documented in the EHR. The CMA/LPN/RN who assisted the patient verbally completed with them and documented results in [facility].   Hearing Screening   Method: Audiometry   125Hz  250Hz  500Hz  1000Hz  2000Hz  4000Hz  8000Hz   Right ear:   40 0 40 0   Left ear:   0 0 0 0   Vision Screening Comments: Wears Glasses-Eye Exam at [facility] 07/2014  GEN: well developed, well nourished, no acute distress. Some weight loss over time. Eyes: conjunctiva and lids normal, PERRLA, EOMI ENT: TM clear,  nares clear, oral exam WNL Neck: supple, no lymphadenopathy, no thyromegaly, no JVD Pulm: clear to auscultation and percussion, respiratory effort normal CV: regular rate and rhythm, S1-S2, no murmur, rub or gallop, no bruits, peripheral pulses normal and symmetric, no cyanosis, clubbing, edema or varicosities GI: soft, non-tender; no hepatosplenomegaly, masses; active bowel sounds all quadrants GU: no hernia, testicular mass, penile discharge Lymph: no cervical, axillary or inguinal adenopathy MSK: gait normal, muscle tone and strength WNL, no joint swelling, effusions, discoloration, crepitus  SKIN: clear, good turgor, color WNL, no rashes, lesions, or ulcerations Neuro: normal mental status, normal strength, sensation, and motion Psych: alert; oriented to person, place and time, normally interactive and not anxious or depressed in appearance.  All labs reviewed with patient.  Lipids:    Component Value Date/Time   CHOL 151 01/21/2015 0824   TRIG 71.0 01/21/2015 0824   HDL 65.60 01/21/2015 0824   VLDL 14.2 01/21/2015 0824   CHOLHDL 2 01/21/2015 0824   CBC: CBC Latest Ref Rng 01/21/2015 12/12/2013 04/06/2012  WBC 4.0 - 10.5 K/uL 5.5  5.4 6.2  Hemoglobin 13.0 - 17.0 g/dL 12.7(L) 12.3(L) 12.9(L)  Hematocrit 39.0 - 52.0 % 38.8(L) 37.1(L) 39.1  Platelets 150.0 - 400.0 K/uL 212.0 194.0 037.0    Basic Metabolic Panel:    Component Value Date/Time   NA 143 01/21/2015 0824   K 4.3 01/21/2015 0824   CL 104 01/21/2015 0824   CO2 33* 01/21/2015 0824   BUN 21 01/21/2015 0824   CREATININE 1.34 01/21/2015 0824   GLUCOSE 102* 01/21/2015 0824   CALCIUM 9.5 01/21/2015 0824   Hepatic Function Latest Ref Rng 01/21/2015 04/24/2014 12/12/2013  Total Protein 6.0 - 8.3 g/dL 6.4 6.2 6.5  Albumin 3.5 - 5.2 g/dL 3.7 3.7 3.6  AST 0 - 37 U/L 33 43(H) 42(H)  ALT 0 - 53 U/L 28 29 29   Alk Phosphatase 39 - 117 U/L 99 103 92  Total Bilirubin 0.2 - 1.2 mg/dL 1.0 0.7 0.9  Bilirubin, Direct 0.0 - 0.3 mg/dL  0.2 0.23 -   Lab Results  Component Value Date   TSH 8.77* 01/21/2015   No results found for: PSA  Assessment and Plan:   Routine general medical examination at a health care facility  Health Maintenance Exam: The patient's preventative maintenance and recommended screening tests for an annual wellness exam were reviewed in full today. Brought up to date unless services declined.  Counselled on the importance of diet, exercise, and its role in overall health and mortality. The patient's FH and SH was reviewed, including their home life, tobacco status, and drug and alcohol status.  I have personally reviewed the Medicare Annual Wellness questionnaire and have noted 1. The patient's medical and social history 2. Their use of alcohol, tobacco or illicit drugs 3. Their current medications and supplements 4. The patient's functional ability including ADL's, fall risks, home safety risks and hearing or visual             impairment. 5. Diet and physical activities 6. Evidence for depression or mood disorders 7. Reviewed Updated provider list, see scanned forms and [facility] Snapshot.   The patients weight, height, BMI and visual acuity have been recorded in the chart I have made referrals, counseling and provided education to the patient based review of the above and I have provided the pt with a written personalized care plan for preventive services.  I have provided the patient with a copy of your personalized plan for preventive services. Instructed to take the time to review along with their updated medication list.  TSH slightly high, but free T4 and Free T3 normal - asx, leave alone.  Follow-up: No Follow-up on file. Or follow-up in 1 year for complete physical examination  Signed,  Frederico Hamman T. Bradshaw Minihan, MD   Patient's Medications  New Prescriptions   No medications on file  Previous Medications   ALENDRONATE (FOSAMAX) 70 MG TABLET    Take 1 tablet (70 mg total) by mouth every  7 (seven) days. Take with a full glass of water on an empty stomach.   AMIODARONE (PACERONE) 200 MG TABLET    Take 0.5 tablets (100 mg total) by mouth daily.   CLINDAMYCIN (CLEOCIN) 300 MG CAPSULE    Take by mouth. 2 tablets by mouth 1 hour prior to dental procedure   CYANOCOBALAMIN (VITAMIN B-12 IJ)    Inject 1,000 mcg/mL as directed every 30 (thirty) days.     DORZOLAMIDE-TIMOLOL (COSOPT) 22.3-6.8 MG/ML OPHTHALMIC SOLUTION    Place 1 drop into both eyes 2 (two) times daily.  FERROUS SULFATE 325 (65 FE) MG EC TABLET    Take 325 mg by mouth daily with breakfast.     LEVOTHYROXINE (SYNTHROID, LEVOTHROID) 100 MCG TABLET    Take 1 tablet (100 mcg total) by mouth daily.   MULTIPLE VITAMIN (MULTIVITAMIN) TABLET    Take 1 tablet by mouth daily.     PANTOPRAZOLE (PROTONIX) 40 MG TABLET    Take 1 tablet (40 mg total) by mouth daily.   PRAVASTATIN (PRAVACHOL) 40 MG TABLET    Take 1 tablet (40 mg total) by mouth daily.   TORSEMIDE (DEMADEX) 20 MG TABLET    Take 1 tablet (20 mg total) by mouth as needed.   WARFARIN (COUMADIN) 4 MG TABLET    Take as directed by coumadin clinic   WHEAT DEXTRIN (BENEFIBER DRINK MIX PO)    Take by mouth as directed.    Modified Medications   No medications on file  Discontinued Medications   No medications on file

## 2015-01-25 ENCOUNTER — Ambulatory Visit (INDEPENDENT_AMBULATORY_CARE_PROVIDER_SITE_OTHER): Payer: Medicare Other

## 2015-01-25 DIAGNOSIS — Z23 Encounter for immunization: Secondary | ICD-10-CM | POA: Diagnosis not present

## 2015-01-25 MED ORDER — LEVOTHYROXINE SODIUM 100 MCG PO TABS: 100.0000 ug | ORAL_TABLET | Freq: Every day | ORAL | Status: DC

## 2015-01-25 MED ORDER — PRAVASTATIN SODIUM 40 MG PO TABS: 20.0000 mg | ORAL_TABLET | Freq: Every day | ORAL | Status: DC

## 2015-01-29 LAB — CUP PACEART REMOTE DEVICE CHECK
Battery Voltage: 2.88 V
Brady Statistic AS VS Percent: 9.49 %
Brady Statistic RA Percent Paced: 90.37 %
Date Time Interrogation Session: 20161025143646
Implantable Lead Implant Date: 20070525
Implantable Lead Location: 753860
Implantable Lead Model: 5076
Lead Channel Impedance Value: 384 Ohm
Lead Channel Sensing Intrinsic Amplitude: 2.113 mV
Lead Channel Setting Pacing Pulse Width: 0.4 ms
Lead Channel Setting Sensing Sensitivity: 0.9 mV
MDC IDC LEAD IMPLANT DT: 20070525
MDC IDC LEAD LOCATION: 753859
MDC IDC MSMT LEADCHNL RA IMPEDANCE VALUE: 376 Ohm
MDC IDC MSMT LEADCHNL RV SENSING INTR AMPL: 2.37 mV
MDC IDC SET LEADCHNL RA PACING AMPLITUDE: 2 V
MDC IDC SET LEADCHNL RV PACING AMPLITUDE: 2.5 V
MDC IDC STAT BRADY AP VP PERCENT: 1.23 %
MDC IDC STAT BRADY AP VS PERCENT: 89.14 %
MDC IDC STAT BRADY AS VP PERCENT: 0.14 %
MDC IDC STAT BRADY RV PERCENT PACED: 1.38 %

## 2015-01-30 ENCOUNTER — Encounter: Payer: Self-pay | Admitting: Cardiology

## 2015-01-30 ENCOUNTER — Ambulatory Visit (INDEPENDENT_AMBULATORY_CARE_PROVIDER_SITE_OTHER): Payer: Medicare Other

## 2015-01-30 DIAGNOSIS — Z7901 Long term (current) use of anticoagulants: Secondary | ICD-10-CM

## 2015-01-30 DIAGNOSIS — I4891 Unspecified atrial fibrillation: Secondary | ICD-10-CM | POA: Diagnosis not present

## 2015-01-30 LAB — POCT INR: INR: 1.8

## 2015-02-19 ENCOUNTER — Ambulatory Visit (INDEPENDENT_AMBULATORY_CARE_PROVIDER_SITE_OTHER): Payer: Medicare Other

## 2015-02-19 DIAGNOSIS — E538 Deficiency of other specified B group vitamins: Secondary | ICD-10-CM | POA: Diagnosis not present

## 2015-02-19 MED ORDER — CYANOCOBALAMIN 1000 MCG/ML IJ SOLN
1000.0000 ug | Freq: Once | INTRAMUSCULAR | Status: AC
  Administered 2015-02-19: 1000 ug via INTRAMUSCULAR

## 2015-02-25 DIAGNOSIS — Z6820 Body mass index (BMI) 20.0-20.9, adult: Secondary | ICD-10-CM | POA: Diagnosis not present

## 2015-02-25 DIAGNOSIS — T191XXD Foreign body in bladder, subsequent encounter: Secondary | ICD-10-CM | POA: Diagnosis not present

## 2015-02-25 DIAGNOSIS — Z8546 Personal history of malignant neoplasm of prostate: Secondary | ICD-10-CM | POA: Diagnosis not present

## 2015-02-26 ENCOUNTER — Telehealth: Payer: Self-pay | Admitting: Cardiology

## 2015-02-26 ENCOUNTER — Ambulatory Visit (INDEPENDENT_AMBULATORY_CARE_PROVIDER_SITE_OTHER): Payer: Medicare Other | Admitting: *Deleted

## 2015-02-26 DIAGNOSIS — Z950 Presence of cardiac pacemaker: Secondary | ICD-10-CM

## 2015-02-26 NOTE — Telephone Encounter (Signed)
LMOVM reminding pt to send remote transmission.   

## 2015-02-27 ENCOUNTER — Ambulatory Visit (INDEPENDENT_AMBULATORY_CARE_PROVIDER_SITE_OTHER): Payer: Medicare Other | Admitting: *Deleted

## 2015-02-27 DIAGNOSIS — I4891 Unspecified atrial fibrillation: Secondary | ICD-10-CM

## 2015-02-27 DIAGNOSIS — Z7901 Long term (current) use of anticoagulants: Secondary | ICD-10-CM

## 2015-02-27 LAB — POCT INR: INR: 1.9

## 2015-02-28 NOTE — Progress Notes (Signed)
Remote pacemaker transmission.   

## 2015-03-06 DIAGNOSIS — H401132 Primary open-angle glaucoma, bilateral, moderate stage: Secondary | ICD-10-CM | POA: Diagnosis not present

## 2015-03-07 LAB — CUP PACEART REMOTE DEVICE CHECK
Battery Voltage: 2.86 V
Brady Statistic AP VP Percent: 1.34 %
Brady Statistic AS VS Percent: 8.69 %
Brady Statistic RA Percent Paced: 91.1 %
Brady Statistic RV Percent Paced: 1.55 %
Date Time Interrogation Session: 20161129205519
Implantable Lead Implant Date: 20070525
Implantable Lead Location: 753860
Implantable Lead Model: 5076
Lead Channel Setting Pacing Amplitude: 2 V
Lead Channel Setting Pacing Pulse Width: 0.4 ms
Lead Channel Setting Sensing Sensitivity: 0.9 mV
MDC IDC LEAD IMPLANT DT: 20070525
MDC IDC LEAD LOCATION: 753859
MDC IDC MSMT LEADCHNL RA IMPEDANCE VALUE: 368 Ohm
MDC IDC MSMT LEADCHNL RA SENSING INTR AMPL: 2.07 mV
MDC IDC MSMT LEADCHNL RV IMPEDANCE VALUE: 376 Ohm
MDC IDC MSMT LEADCHNL RV SENSING INTR AMPL: 1.693 mV
MDC IDC SET LEADCHNL RV PACING AMPLITUDE: 2.5 V
MDC IDC STAT BRADY AP VS PERCENT: 89.76 %
MDC IDC STAT BRADY AS VP PERCENT: 0.21 %

## 2015-03-08 ENCOUNTER — Encounter: Payer: Self-pay | Admitting: Cardiology

## 2015-03-13 ENCOUNTER — Other Ambulatory Visit: Payer: Self-pay | Admitting: *Deleted

## 2015-03-13 ENCOUNTER — Ambulatory Visit (INDEPENDENT_AMBULATORY_CARE_PROVIDER_SITE_OTHER): Payer: Medicare Other

## 2015-03-13 ENCOUNTER — Other Ambulatory Visit: Payer: Self-pay

## 2015-03-13 DIAGNOSIS — I4891 Unspecified atrial fibrillation: Secondary | ICD-10-CM

## 2015-03-13 DIAGNOSIS — Z7901 Long term (current) use of anticoagulants: Secondary | ICD-10-CM | POA: Diagnosis not present

## 2015-03-13 LAB — POCT INR: INR: 1.4

## 2015-03-13 MED ORDER — AMIODARONE HCL 200 MG PO TABS: 100.0000 mg | ORAL_TABLET | Freq: Every day | ORAL | Status: DC

## 2015-03-13 NOTE — Telephone Encounter (Signed)
Pt needs 90 day refill on Amiodarone sent to [facility]. Thanks

## 2015-03-20 ENCOUNTER — Ambulatory Visit (INDEPENDENT_AMBULATORY_CARE_PROVIDER_SITE_OTHER): Payer: Medicare Other

## 2015-03-20 DIAGNOSIS — I4891 Unspecified atrial fibrillation: Secondary | ICD-10-CM

## 2015-03-20 DIAGNOSIS — Z7901 Long term (current) use of anticoagulants: Secondary | ICD-10-CM | POA: Diagnosis not present

## 2015-03-20 LAB — POCT INR: INR: 2.6

## 2015-03-26 ENCOUNTER — Ambulatory Visit (INDEPENDENT_AMBULATORY_CARE_PROVIDER_SITE_OTHER): Payer: Medicare Other

## 2015-03-26 DIAGNOSIS — E538 Deficiency of other specified B group vitamins: Secondary | ICD-10-CM

## 2015-03-26 MED ORDER — CYANOCOBALAMIN 1000 MCG/ML IJ SOLN
1000.0000 ug | Freq: Once | INTRAMUSCULAR | Status: AC
  Administered 2015-03-26: 1000 ug via INTRAMUSCULAR

## 2015-03-28 ENCOUNTER — Encounter: Payer: Self-pay | Admitting: *Deleted

## 2015-03-29 ENCOUNTER — Ambulatory Visit (INDEPENDENT_AMBULATORY_CARE_PROVIDER_SITE_OTHER): Payer: Medicare Other | Admitting: *Deleted

## 2015-03-29 DIAGNOSIS — Z950 Presence of cardiac pacemaker: Secondary | ICD-10-CM

## 2015-03-29 NOTE — Progress Notes (Signed)
Remote pacemaker transmission.   

## 2015-04-03 ENCOUNTER — Ambulatory Visit (INDEPENDENT_AMBULATORY_CARE_PROVIDER_SITE_OTHER): Payer: Medicare Other

## 2015-04-03 DIAGNOSIS — I4891 Unspecified atrial fibrillation: Secondary | ICD-10-CM

## 2015-04-03 DIAGNOSIS — Z7901 Long term (current) use of anticoagulants: Secondary | ICD-10-CM | POA: Diagnosis not present

## 2015-04-03 LAB — POCT INR: INR: 2.8

## 2015-04-12 ENCOUNTER — Other Ambulatory Visit: Payer: Self-pay

## 2015-04-12 MED ORDER — ALENDRONATE SODIUM 70 MG PO TABS: 70.0000 mg | ORAL_TABLET | ORAL | Status: DC

## 2015-04-12 NOTE — Telephone Encounter (Signed)
Pt left note requesting alendronate to [facility]; left detailed message refill done.

## 2015-04-16 LAB — CUP PACEART REMOTE DEVICE CHECK
Battery Voltage: 2.86 V
Brady Statistic AS VS Percent: 5.09 %
Implantable Lead Implant Date: 20070525
Implantable Lead Location: 753860
Implantable Lead Model: 5076
Lead Channel Impedance Value: 392 Ohm
Lead Channel Sensing Intrinsic Amplitude: 2.501 mV
MDC IDC LEAD IMPLANT DT: 20070525
MDC IDC LEAD LOCATION: 753859
MDC IDC MSMT LEADCHNL RA IMPEDANCE VALUE: 376 Ohm
MDC IDC MSMT LEADCHNL RV SENSING INTR AMPL: 2.37 mV
MDC IDC SESS DTM: 20161230212122
MDC IDC SET LEADCHNL RA PACING AMPLITUDE: 2 V
MDC IDC SET LEADCHNL RV PACING AMPLITUDE: 2.5 V
MDC IDC SET LEADCHNL RV PACING PULSEWIDTH: 0.4 ms
MDC IDC SET LEADCHNL RV SENSING SENSITIVITY: 0.9 mV
MDC IDC STAT BRADY AP VP PERCENT: 1.51 %
MDC IDC STAT BRADY AP VS PERCENT: 93.11 %
MDC IDC STAT BRADY AS VP PERCENT: 0.29 %
MDC IDC STAT BRADY RA PERCENT PACED: 94.62 %
MDC IDC STAT BRADY RV PERCENT PACED: 1.8 %

## 2015-04-17 ENCOUNTER — Encounter: Payer: Self-pay | Admitting: Cardiology

## 2015-04-30 ENCOUNTER — Ambulatory Visit (INDEPENDENT_AMBULATORY_CARE_PROVIDER_SITE_OTHER): Payer: Medicare Other | Admitting: *Deleted

## 2015-04-30 ENCOUNTER — Ambulatory Visit (INDEPENDENT_AMBULATORY_CARE_PROVIDER_SITE_OTHER): Payer: Medicare Other | Admitting: Internal Medicine

## 2015-04-30 ENCOUNTER — Encounter: Payer: Self-pay | Admitting: Internal Medicine

## 2015-04-30 ENCOUNTER — Ambulatory Visit (INDEPENDENT_AMBULATORY_CARE_PROVIDER_SITE_OTHER): Payer: Medicare Other

## 2015-04-30 VITALS — BP 140/86 | HR 62 | Ht 70.0 in | Wt 150.0 lb

## 2015-04-30 DIAGNOSIS — I495 Sick sinus syndrome: Secondary | ICD-10-CM | POA: Diagnosis not present

## 2015-04-30 DIAGNOSIS — Z950 Presence of cardiac pacemaker: Secondary | ICD-10-CM | POA: Diagnosis not present

## 2015-04-30 DIAGNOSIS — E538 Deficiency of other specified B group vitamins: Secondary | ICD-10-CM

## 2015-04-30 DIAGNOSIS — Z7901 Long term (current) use of anticoagulants: Secondary | ICD-10-CM

## 2015-04-30 DIAGNOSIS — I4891 Unspecified atrial fibrillation: Secondary | ICD-10-CM

## 2015-04-30 DIAGNOSIS — I480 Paroxysmal atrial fibrillation: Secondary | ICD-10-CM

## 2015-04-30 LAB — POCT INR: INR: 3.7

## 2015-04-30 MED ORDER — CYANOCOBALAMIN 1000 MCG/ML IJ SOLN
1000.0000 ug | Freq: Once | INTRAMUSCULAR | Status: AC
  Administered 2015-04-30: 1000 ug via INTRAMUSCULAR

## 2015-04-30 NOTE — Progress Notes (Signed)
Electrophysiology Office Note   Date:  04/30/2015   ID:  [name] [name], DOB November 02, 1922, MRN [id number]  PCP:  [name] Loffler, MD  Cardiologist:  Primary Electrophysiologist:  [name] Axe, MD    Chief Complaint  Patient presents with  . other    Device check as well as follow up. Pt. thinks he may have shingles! Meds reviewed by the patient verbally.      History of Present Illness: [name] [name] is a 82 y.o. male is seen today in followup for  pacemaker implanted for tachybradycardia syndrome in  the context of paroxysmal atrial fibrillation with prior failed ablation. He takes amiodarone.He has hypertension and last year was noted to have orthostasis.  His last TSH in the fall was 8.77. Free T3 and free to 4 word the lower limits of normal.   The patient denies chest pain, shortness of breath, nocturnal dyspnea, orthopnea or peripheral edema. There have been no palpitations, lightheadedness or syncope.  He does complain of cold.   Patient denies symptoms of GI intolerance, sun sensitivity, neurological symptoms attributable to amiodarone.     Past Medical History  Diagnosis Date  . GI hemorrhage   . Radiation proctitis   . 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 Paroxysmal atrial fibrillation ([name])     s/p failed ablation  . Cardiac pacemaker in situ 07/2005    symptomatic bradycardia  . Hyperlipidemia   . Hypertension   . GERD (gastroesophageal reflux disease)   . Unspecified glaucoma   . Vitamin B12 deficiency   . Peptic ulcer, unspecified site, unspecified as acute or chronic, without mention of hemorrhage, perforation, or obstruction 1975  . Personal history of malignant neoplasm of prostate 12/2000    5 weeks radiation, radiation seeds  . Diverticulosis of colon (without mention of hemorrhage)   . Unspecified hypothyroidism   . 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 Sinoatrial node dysfunction ([name])   . Neck arthritis,  Severe, multi-level 06/09/2013   Past Surgical History  Procedure Laterality Date  . Hernia repair  1994, 2001, 2003    s/p drainage and complications, 123456, [id number] mesh removal  . Insert / replace / remove pacemaker  07/2005    symptomatic bradycardia  . Cardiac electrophysiology mapping and ablation  2001, 2003    failure  . Cataract extraction    . Cardiac catheterization    . Tooth extraction    . Nose surgery      cancer removed      Current Outpatient Prescriptions  Medication Sig Dispense Refill  . alendronate (FOSAMAX) 70 MG tablet Take 1 tablet (70 mg total) by mouth every 7 (seven) days. Take with a full glass of water on an empty stomach. 12 tablet 1  . amiodarone (PACERONE) 200 MG tablet Take 0.5 tablets (100 mg total) by mouth daily. 135 tablet 3  . clindamycin (CLEOCIN) 300 MG capsule Take by mouth. 2 tablets by mouth 1 hour prior to dental procedure    . Cyanocobalamin (VITAMIN B-12 IJ) Inject 1,000 mcg/mL as directed every 30 (thirty) days.      . dorzolamide-timolol (COSOPT) 22.3-6.8 MG/ML ophthalmic solution Place 1 drop into both eyes 2 (two) times daily.      . ferrous sulfate 325 (65 FE) MG EC tablet Take 325 mg by mouth daily with breakfast.      . levothyroxine (SYNTHROID, LEVOTHROID) 100 MCG tablet Take 1 tablet (100 mcg total) by  mouth daily. 90 tablet 3  . Multiple Vitamin (MULTIVITAMIN) tablet Take 1 tablet by mouth daily.      . pantoprazole (PROTONIX) 40 MG tablet Take 1 tablet (40 mg total) by mouth daily. 90 tablet 1  . pravastatin (PRAVACHOL) 40 MG tablet Take 0.5 tablets (20 mg total) by mouth daily. 90 tablet 3  . torsemide (DEMADEX) 20 MG tablet Take 1 tablet (20 mg total) by mouth as needed. 90 tablet 3  . warfarin (COUMADIN) 4 MG tablet Take as directed by coumadin clinic 90 tablet 1  . Wheat Dextrin (BENEFIBER DRINK MIX PO) Take by mouth as directed.       No current facility-administered medications for this visit.    Allergies:    Penicillins   Social History:  The patient  reports that he has never smoked. He has never used smokeless tobacco. He reports that he does not drink alcohol or use illicit drugs.   Family History:  The patient's    family history includes Bone cancer in his father; Breast cancer in his mother.    ROS:  Please see the history of present illness and past medical histor .   All other systems are reviewed and negative.    PHYSICAL EXAM: VS:  BP 140/86 mmHg  Pulse 62  Ht 5\' 10"  (1.778 m)  Wt 150 lb (68.04 kg)  BMI 21.52 kg/m2 , BMI Body mass index is 21.52 kg/(m^2). GEN: Well nourished, well developed, in no acute distress HEENT: normal Neck:  JVD flat , carotid bruits, or masses Cardiac: REGULAR RATE and RHYTHM ; 2/6 murmurs, rubs, No S4  Back without kyphosis or CVAT Respiratory:  clear to auscultation bilaterally, normal work of breathing GI: soft, nontender, nondistended, + BS MS: no deformity or atrophy Extremities no clubbing cyanosis  edema Skin: warm and dry,  device pocket is well healed without teathering Neuro:  Strength and sensation are intact Psych: euthymic mood, full affect     Device interrogation is reviewed today in detail.  See PaceArt for details.   Recent Labs: 01/21/2015: ALT 28; BUN 21; Creatinine, Ser 1.34; Hemoglobin 12.7*; Platelets 212.0; Potassium 4.3; Sodium 143; TSH 8.77*    Lipid Panel     Component Value Date/Time   CHOL 151 01/21/2015 0824   TRIG 71.0 01/21/2015 0824   HDL 65.60 01/21/2015 0824   CHOLHDL 2 01/21/2015 0824   VLDL 14.2 01/21/2015 0824   LDLCALC 72 01/21/2015 0824     Wt Readings from Last 3 Encounters:  04/30/15 150 lb (68.04 kg)  01/24/15 146 lb 4 oz (66.339 kg)  04/24/14 149 lb (67.586 kg)      Other studies Reviewed:    ASSESSMENT AND PLAN:  Atrial fibrillation  -= paroxysmal  Sinus node dysfunction   Pacemaker  [employer]  Hypothyroidism--  Amiodarone therapy    His TSH was elevated. T3 and T4 were  both at the lower limits of normal. Recheck today. I suspect that he is somewhat hypothyroid. It is feeling of coldness could be either related to this or his anticoagulation. We discussed the physiology of TSH elevation  He is having infrequent recurrent atrial fibrillation. We will continue his amiodarone. Surveillance laboratories had demonstrated an elevation in his TSH as noted, transaminases were normal. Current symptoms are not suggestive of amiodarone toxicity.  We discussed the use of the NOACs compared to Coumadin. We briefly reviewed the data of at least comparability in stroke prevention, bleeding and outcome. We discussed some of the  new once wherein somewhat associated with decreased ischemic stroke risk, one to be taken daily, and has been shown to be comparable and bleeding risk to aspirin.  We also discussed bleeding associated with warfarin as well as NOACs and a wall bleeding as a complication of all these drugs intracranial bleeding is more frequently associated with warfarin then the NOACs and a GI bleeding is more commonly associated with the latter  No bleeding issues   We spent more than 50% of our >25 min visit in face to face counseling regarding the above        Orders Placed This Encounter  Procedures  . EKG 12-Lead     Disposition:   FU with me 6 months Signed, [name] Axe, MD  04/30/2015 11:14 AM     [facility] [address] [place] [address] [phone] (office) [phone] (fax)

## 2015-04-30 NOTE — Patient Instructions (Addendum)
Medication Instructions: - Your physician recommends that you continue on your current medications as directed. Please refer to the Current Medication list given to you today.  Labwork: - Your physician recommends that you have lab work today: TSH/ free T3/ free T4  Procedures/Testing: - none  Follow-Up: - Remote monitoring is used to monitor your Pacemaker of ICD from home. This monitoring reduces the number of office visits required to check your device to one time per year. It allows [place] to keep an eye on the functioning of your device to ensure it is working properly. You are scheduled for a device check from home on 05/28/15. You may send your transmission at any time that day. If you have a wireless device, the transmission will be sent automatically. After your physician reviews your transmission, you will receive a postcard with your next transmission date.  - Your physician wants you to follow-up in: 6 months with Dr. Caryl Comes. You will receive a reminder letter in the mail two months in advance. If you don't receive a letter, please call our office to schedule the follow-up appointment.  Any Additional Special Instructions Will Be Listed Below (If Applicable).     If you need a refill on your cardiac medications before your next appointment, please call your pharmacy.

## 2015-05-01 LAB — T3, FREE: T3 FREE: 1.6 pg/mL — AB (ref 2.0–4.4)

## 2015-05-01 LAB — T4, FREE: FREE T4: 1.19 ng/dL (ref 0.82–1.77)

## 2015-05-01 LAB — TSH: TSH: 5.82 u[IU]/mL — ABNORMAL HIGH (ref 0.450–4.500)

## 2015-05-03 ENCOUNTER — Telehealth: Payer: Self-pay | Admitting: *Deleted

## 2015-05-03 NOTE — Telephone Encounter (Signed)
Please call and schedule follow up on thyroid labs with Dr. Lorelei Pont sometime in the next couple of months.

## 2015-05-03 NOTE — Telephone Encounter (Signed)
-----   Message from Owens Loffler, MD sent at 05/02/2015 10:09 AM EST ----- He can f/u with me in a couple of months - no need now

## 2015-05-04 LAB — CUP PACEART INCLINIC DEVICE CHECK
Brady Statistic AP VS Percent: 90.1 %
Brady Statistic AS VS Percent: 8.4 %
Date Time Interrogation Session: 20170204101255
Implantable Lead Location: 753860
Implantable Lead Model: 5076
Implantable Lead Model: 5076
Lead Channel Pacing Threshold Pulse Width: 0.4 ms
Lead Channel Sensing Intrinsic Amplitude: 1.3 mV
Lead Channel Sensing Intrinsic Amplitude: 3 mV
MDC IDC LEAD IMPLANT DT: 20070525
MDC IDC LEAD IMPLANT DT: 20070525
MDC IDC LEAD LOCATION: 753859
MDC IDC MSMT LEADCHNL RA PACING THRESHOLD AMPLITUDE: 1 V
MDC IDC MSMT LEADCHNL RA PACING THRESHOLD PULSEWIDTH: 0.4 ms
MDC IDC MSMT LEADCHNL RV PACING THRESHOLD AMPLITUDE: 1 V
MDC IDC STAT BRADY AP VP PERCENT: 1 %
MDC IDC STAT BRADY AS VP PERCENT: 0.2 %

## 2015-05-06 NOTE — Telephone Encounter (Signed)
Labs 3/3 appointment 3/6/ Pt aware

## 2015-05-16 ENCOUNTER — Encounter: Payer: Self-pay | Admitting: Internal Medicine

## 2015-05-28 ENCOUNTER — Ambulatory Visit (INDEPENDENT_AMBULATORY_CARE_PROVIDER_SITE_OTHER): Payer: Medicare Other | Admitting: *Deleted

## 2015-05-28 DIAGNOSIS — Z950 Presence of cardiac pacemaker: Secondary | ICD-10-CM

## 2015-05-28 NOTE — Progress Notes (Signed)
Remote pacemaker transmission.   

## 2015-05-29 ENCOUNTER — Ambulatory Visit (INDEPENDENT_AMBULATORY_CARE_PROVIDER_SITE_OTHER): Payer: Medicare Other

## 2015-05-29 DIAGNOSIS — I480 Paroxysmal atrial fibrillation: Secondary | ICD-10-CM

## 2015-05-29 DIAGNOSIS — I4891 Unspecified atrial fibrillation: Secondary | ICD-10-CM

## 2015-05-29 DIAGNOSIS — Z7901 Long term (current) use of anticoagulants: Secondary | ICD-10-CM

## 2015-05-29 LAB — POCT INR: INR: 2.8

## 2015-05-31 ENCOUNTER — Ambulatory Visit (INDEPENDENT_AMBULATORY_CARE_PROVIDER_SITE_OTHER): Payer: Medicare Other

## 2015-05-31 ENCOUNTER — Other Ambulatory Visit (INDEPENDENT_AMBULATORY_CARE_PROVIDER_SITE_OTHER): Payer: Medicare Other

## 2015-05-31 DIAGNOSIS — E039 Hypothyroidism, unspecified: Secondary | ICD-10-CM | POA: Diagnosis not present

## 2015-05-31 DIAGNOSIS — E538 Deficiency of other specified B group vitamins: Secondary | ICD-10-CM | POA: Diagnosis not present

## 2015-05-31 LAB — TSH: TSH: 4.92 u[IU]/mL — ABNORMAL HIGH (ref 0.35–4.50)

## 2015-05-31 LAB — T3, FREE: T3 FREE: 2.1 pg/mL — AB (ref 2.3–4.2)

## 2015-05-31 LAB — T4, FREE: FREE T4: 0.89 ng/dL (ref 0.60–1.60)

## 2015-05-31 MED ORDER — CYANOCOBALAMIN 1000 MCG/ML IJ SOLN
1000.0000 ug | Freq: Once | INTRAMUSCULAR | Status: AC
  Administered 2015-05-31: 1000 ug via INTRAMUSCULAR

## 2015-06-03 ENCOUNTER — Encounter: Payer: Self-pay | Admitting: Family Medicine

## 2015-06-03 ENCOUNTER — Other Ambulatory Visit: Payer: Self-pay | Admitting: *Deleted

## 2015-06-03 ENCOUNTER — Ambulatory Visit (INDEPENDENT_AMBULATORY_CARE_PROVIDER_SITE_OTHER): Payer: Medicare Other | Admitting: Family Medicine

## 2015-06-03 VITALS — BP 146/84 | HR 92 | Temp 98.3°F | Ht 70.0 in | Wt 151.8 lb

## 2015-06-03 DIAGNOSIS — E038 Other specified hypothyroidism: Secondary | ICD-10-CM

## 2015-06-03 DIAGNOSIS — L309 Dermatitis, unspecified: Secondary | ICD-10-CM

## 2015-06-03 MED ORDER — LEVOTHYROXINE SODIUM 112 MCG PO TABS: 112.0000 ug | ORAL_TABLET | Freq: Every day | ORAL | Status: DC

## 2015-06-03 MED ORDER — TORSEMIDE 20 MG PO TABS: 20.0000 mg | ORAL_TABLET | ORAL | Status: DC | PRN

## 2015-06-03 MED ORDER — TORSEMIDE 20 MG PO TABS: 20.0000 mg | ORAL_TABLET | Freq: Every day | ORAL | Status: DC | PRN

## 2015-06-03 MED ORDER — PANTOPRAZOLE SODIUM 40 MG PO TBEC: 40.0000 mg | DELAYED_RELEASE_TABLET | Freq: Every day | ORAL | Status: DC

## 2015-06-03 MED ORDER — FLUOCINONIDE 0.05 % EX OINT: 1.0000 "application " | TOPICAL_OINTMENT | Freq: Two times a day (BID) | CUTANEOUS | Status: DC

## 2015-06-03 NOTE — Progress Notes (Signed)
Dr. Frederico Hamman T. Montzerrat Brunell, MD, [facility] [place], [address] Phone: [phone] Fax: [phone]  06/03/2015  Patient: [name] [name], MRN: [id number], DOB: 1922/05/13, 82 y.o.  Primary Physician:  Owens Loffler, MD   Chief Complaint  Patient presents with  . Follow-up    Thyroid Labs   Subjective:   [name] [name] is a 82 y.o. very pleasant male patient who presents with the following:  F/u thyroid:   Spot on his right side - been there for about a month or so. Itching a little.  Thyroid: No symptoms. Labs reviewed. Denies cold / heat intolerance, dry skin, hair loss. No goiter. Taking all meds  Lab Results  Component Value Date   TSH 4.92* 05/31/2015    Results for orders placed or performed in visit on 05/31/15  TSH  Result Value Ref Range   TSH 4.92 (H) 0.35 - 4.50 uIU/mL  T3, free  Result Value Ref Range   T3, Free 2.1 (L) 2.3 - 4.2 pg/mL  T4, free  Result Value Ref Range   Free T4 0.89 0.60 - 1.60 ng/dL     Past Medical History, Surgical History, Social History, Family History, Problem List, Medications, and Allergies have been reviewed and updated if relevant.  Patient Active Problem List   Diagnosis Date Noted  . PACEMAKER, PERMANENT 08/07/2008    Priority: Medium  . PAROXYSMAL ATRIAL FIBRILLATION 02/06/2008    Priority: Medium  . DDD (degenerative disc disease), cervical, Severe 06/09/2013  . Neck arthritis, Severe, multi-level 06/09/2013  . Sinoatrial node dysfunction ([name])   . Long term (current) use of anticoagulants 06/24/2010  . IRON DEFICIENCY 04/17/2010  . GLAUCOMA 08/07/2008  . GERD 08/07/2008  . Vitamin B 12 deficiency 07/19/2008  . PEPTIC ULCER DISEASE 07/18/2008  . RADIATION PROCTITIS 07/18/2008  . DIVERTICULOSIS, COLON 07/18/2008  . Prostate cancer, in remission 07/18/2008  . HYPERTENSION, ESSENTIAL NOS 05/09/2008  . Hyperlipidemia 02/06/2008  . Hypothyroidism  12/01/2007  . HERN UNS SITE ABD CAV W/O MENTION OBST/GANGREN 12/01/2007    Past Medical History  Diagnosis Date  . GI hemorrhage   . Radiation proctitis   . 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 Paroxysmal atrial fibrillation ([name])     s/p failed ablation  . Cardiac pacemaker in situ 07/2005    symptomatic bradycardia  . Hyperlipidemia   . Hypertension   . GERD (gastroesophageal reflux disease)   . Unspecified glaucoma   . Vitamin B12 deficiency   . Peptic ulcer, unspecified site, unspecified as acute or chronic, without mention of hemorrhage, perforation, or obstruction 1975  . Personal history of malignant neoplasm of prostate 12/2000    5 weeks radiation, radiation seeds  . Diverticulosis of colon (without mention of hemorrhage)   . Unspecified hypothyroidism   . 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 Sinoatrial node dysfunction ([name])   . Neck arthritis, Severe, multi-level 06/09/2013    Past Surgical History  Procedure Laterality Date  . Hernia repair  1994, 2001, 2003    s/p drainage and complications, 123456, [id number] mesh removal  . Insert / replace / remove pacemaker  07/2005    symptomatic bradycardia  . Cardiac electrophysiology mapping and ablation  2001, 2003    failure  . Cataract extraction    . Cardiac catheterization    . Tooth extraction    . Nose surgery  cancer removed     Social History   Social History  . Marital Status: Widowed    Spouse Name: N/A  . Number of Children: N/A  . Years of Education: N/A   Occupational History  . Not on file.   Social History Main Topics  . Smoking status: Never Smoker   . Smokeless tobacco: Never Used  . Alcohol Use: No  . Drug Use: No  . Sexual Activity: Not on file   Other Topics Concern  . Not on file   Social History Narrative    Family History  Problem Relation Age of Onset  . Breast cancer Mother   . Bone cancer Father   . Alcohol abuse    . Coronary artery  disease    . Dementia    . Diabetes      Allergies  Allergen Reactions  . Penicillins     Medication list reviewed and updated in full in [place].   GEN: No acute illnesses, no fevers, chills. GI: No n/v/d, eating normally Pulm: No SOB Interactive and getting along well at home.  Otherwise, ROS is as per the HPI.  Objective:   BP 146/84 mmHg  Pulse 92  Temp(Src) 98.3 F (36.8 C) (Oral)  Ht 5\' 10"  (1.778 m)  Wt 151 lb 12 oz (68.833 kg)  BMI 21.77 kg/m2  GEN: WDWN, NAD, Non-toxic, A & O x 3 HEENT: Atraumatic, Normocephalic. Neck supple. No masses, No LAD. Ears and Nose: No external deformity. CV: RRR, No M/G/R. No JVD. No thrill. No extra heart sounds. PULM: CTA B, no wheezes, crackles, rhonchi. No retractions. No resp. distress. No accessory muscle use. EXTR: No c/c/e NEURO Normal gait.  PSYCH: Normally interactive. Conversant. Not depressed or anxious appearing.  Calm demeanor.   Scattered slight scale on R thorax  Laboratory and Imaging Data:  Assessment and Plan:   Other specified hypothyroidism  Dermatitis  Increase synthroid to 112 mcg daily  Lidex trial skin  Follow-up: Return for lab appointment - same day as B12 shot.  New Prescriptions   FLUOCINONIDE OINTMENT (LIDEX) 0.05 %    Apply 1 application topically 2 (two) times daily.   Modified Medications   Modified Medication Previous Medication   LEVOTHYROXINE (SYNTHROID, LEVOTHROID) 112 MCG TABLET levothyroxine (SYNTHROID, LEVOTHROID) 100 MCG tablet      Take 1 tablet (112 mcg total) by mouth daily.    Take 1 tablet (100 mcg total) by mouth daily.   PANTOPRAZOLE (PROTONIX) 40 MG TABLET pantoprazole (PROTONIX) 40 MG tablet      Take 1 tablet (40 mg total) by mouth daily.    Take 1 tablet (40 mg total) by mouth daily.   TORSEMIDE (DEMADEX) 20 MG TABLET torsemide (DEMADEX) 20 MG tablet      Take 1 tablet (20 mg total) by mouth as needed.    Take 1 tablet (20 mg total) by mouth as needed.    No orders of the defined types were placed in this encounter.    Signed,  [name]. Siarra Gilkerson, MD   Patient's Medications  New Prescriptions   FLUOCINONIDE OINTMENT (LIDEX) 0.05 %    Apply 1 application topically 2 (two) times daily.  Previous Medications   ALENDRONATE (FOSAMAX) 70 MG TABLET    Take 1 tablet (70 mg total) by mouth every 7 (seven) days. Take with a full glass of water on an empty stomach.   AMIODARONE (PACERONE) 200 MG TABLET    Take 0.5 tablets (100  mg total) by mouth daily.   CLINDAMYCIN (CLEOCIN) 300 MG CAPSULE    Take by mouth. 2 tablets by mouth 1 hour prior to dental procedure   CYANOCOBALAMIN (VITAMIN B-12 IJ)    Inject 1,000 mcg/mL as directed every 30 (thirty) days.     DORZOLAMIDE-TIMOLOL (COSOPT) 22.3-6.8 MG/ML OPHTHALMIC SOLUTION    Place 1 drop into both eyes 2 (two) times daily.     FERROUS SULFATE 325 (65 FE) MG EC TABLET    Take 325 mg by mouth daily with breakfast.     MULTIPLE VITAMIN (MULTIVITAMIN) TABLET    Take 1 tablet by mouth daily.     PRAVASTATIN (PRAVACHOL) 40 MG TABLET    Take 0.5 tablets (20 mg total) by mouth daily.   WARFARIN (COUMADIN) 4 MG TABLET    Take as directed by coumadin clinic   WHEAT DEXTRIN (BENEFIBER DRINK MIX PO)    Take by mouth as directed.    Modified Medications   Modified Medication Previous Medication   LEVOTHYROXINE (SYNTHROID, LEVOTHROID) 112 MCG TABLET levothyroxine (SYNTHROID, LEVOTHROID) 100 MCG tablet      Take 1 tablet (112 mcg total) by mouth daily.    Take 1 tablet (100 mcg total) by mouth daily.   PANTOPRAZOLE (PROTONIX) 40 MG TABLET pantoprazole (PROTONIX) 40 MG tablet      Take 1 tablet (40 mg total) by mouth daily.    Take 1 tablet (40 mg total) by mouth daily.   TORSEMIDE (DEMADEX) 20 MG TABLET torsemide (DEMADEX) 20 MG tablet      Take 1 tablet (20 mg total) by mouth as needed.    Take 1 tablet (20 mg total) by mouth as needed.  Discontinued Medications   No medications on file

## 2015-06-03 NOTE — Progress Notes (Signed)
Pre visit review using our clinic review tool, if applicable. No additional management support is needed unless otherwise documented below in the visit note. 

## 2015-06-17 DIAGNOSIS — L570 Actinic keratosis: Secondary | ICD-10-CM | POA: Diagnosis not present

## 2015-06-17 DIAGNOSIS — Z85828 Personal history of other malignant neoplasm of skin: Secondary | ICD-10-CM | POA: Diagnosis not present

## 2015-06-17 DIAGNOSIS — Z872 Personal history of diseases of the skin and subcutaneous tissue: Secondary | ICD-10-CM | POA: Diagnosis not present

## 2015-06-17 DIAGNOSIS — Z1283 Encounter for screening for malignant neoplasm of skin: Secondary | ICD-10-CM | POA: Diagnosis not present

## 2015-06-17 DIAGNOSIS — Z08 Encounter for follow-up examination after completed treatment for malignant neoplasm: Secondary | ICD-10-CM | POA: Diagnosis not present

## 2015-06-17 DIAGNOSIS — L853 Xerosis cutis: Secondary | ICD-10-CM | POA: Diagnosis not present

## 2015-06-26 ENCOUNTER — Ambulatory Visit (INDEPENDENT_AMBULATORY_CARE_PROVIDER_SITE_OTHER): Payer: Medicare Other

## 2015-06-26 DIAGNOSIS — Z7901 Long term (current) use of anticoagulants: Secondary | ICD-10-CM | POA: Diagnosis not present

## 2015-06-26 DIAGNOSIS — I4891 Unspecified atrial fibrillation: Secondary | ICD-10-CM

## 2015-06-26 LAB — POCT INR: INR: 4.1

## 2015-06-28 ENCOUNTER — Ambulatory Visit (INDEPENDENT_AMBULATORY_CARE_PROVIDER_SITE_OTHER): Payer: Medicare Other | Admitting: *Deleted

## 2015-06-28 ENCOUNTER — Encounter: Payer: Self-pay | Admitting: Cardiology

## 2015-06-28 DIAGNOSIS — Z950 Presence of cardiac pacemaker: Secondary | ICD-10-CM

## 2015-06-28 LAB — CUP PACEART REMOTE DEVICE CHECK
Brady Statistic AP VS Percent: 84.07 %
Brady Statistic AS VP Percent: 0.7 %
Brady Statistic AS VS Percent: 14.05 %
Brady Statistic RV Percent Paced: 1.88 %
Implantable Lead Implant Date: 20070525
Implantable Lead Implant Date: 20070525
Implantable Lead Location: 753859
Implantable Lead Model: 5076
Implantable Lead Model: 5076
Lead Channel Impedance Value: 368 Ohm
Lead Channel Sensing Intrinsic Amplitude: 2.242 mV
Lead Channel Sensing Intrinsic Amplitude: 2.37 mV
Lead Channel Setting Pacing Amplitude: 2 V
Lead Channel Setting Pacing Amplitude: 2.5 V
MDC IDC LEAD LOCATION: 753860
MDC IDC MSMT BATTERY VOLTAGE: 2.85 V
MDC IDC MSMT LEADCHNL RV IMPEDANCE VALUE: 384 Ohm
MDC IDC SESS DTM: 20170228164125
MDC IDC SET LEADCHNL RV PACING PULSEWIDTH: 0.4 ms
MDC IDC SET LEADCHNL RV SENSING SENSITIVITY: 0.9 mV
MDC IDC STAT BRADY AP VP PERCENT: 1.18 %
MDC IDC STAT BRADY RA PERCENT PACED: 85.25 %

## 2015-06-28 NOTE — Progress Notes (Signed)
Remote pacemaker transmission.   

## 2015-07-02 ENCOUNTER — Ambulatory Visit (INDEPENDENT_AMBULATORY_CARE_PROVIDER_SITE_OTHER): Payer: Medicare Other

## 2015-07-02 ENCOUNTER — Other Ambulatory Visit (INDEPENDENT_AMBULATORY_CARE_PROVIDER_SITE_OTHER): Payer: Medicare Other

## 2015-07-02 DIAGNOSIS — E039 Hypothyroidism, unspecified: Secondary | ICD-10-CM | POA: Diagnosis not present

## 2015-07-02 DIAGNOSIS — E538 Deficiency of other specified B group vitamins: Secondary | ICD-10-CM | POA: Diagnosis not present

## 2015-07-02 LAB — T3, FREE: T3, Free: 2.2 pg/mL — ABNORMAL LOW (ref 2.3–4.2)

## 2015-07-02 LAB — T4, FREE: Free T4: 1.47 ng/dL (ref 0.60–1.60)

## 2015-07-02 LAB — TSH: TSH: 1.25 u[IU]/mL (ref 0.35–4.50)

## 2015-07-02 MED ORDER — CYANOCOBALAMIN 1000 MCG/ML IJ SOLN
1000.0000 ug | Freq: Once | INTRAMUSCULAR | Status: AC
  Administered 2015-07-02: 1000 ug via INTRAMUSCULAR

## 2015-07-10 ENCOUNTER — Ambulatory Visit (INDEPENDENT_AMBULATORY_CARE_PROVIDER_SITE_OTHER): Payer: Medicare Other

## 2015-07-10 DIAGNOSIS — I4891 Unspecified atrial fibrillation: Secondary | ICD-10-CM

## 2015-07-10 DIAGNOSIS — Z7901 Long term (current) use of anticoagulants: Secondary | ICD-10-CM | POA: Diagnosis not present

## 2015-07-10 LAB — POCT INR: INR: 2.6

## 2015-07-16 DIAGNOSIS — T191XXD Foreign body in bladder, subsequent encounter: Secondary | ICD-10-CM | POA: Diagnosis not present

## 2015-07-16 DIAGNOSIS — Z8546 Personal history of malignant neoplasm of prostate: Secondary | ICD-10-CM | POA: Diagnosis not present

## 2015-07-16 DIAGNOSIS — Z6820 Body mass index (BMI) 20.0-20.9, adult: Secondary | ICD-10-CM | POA: Diagnosis not present

## 2015-07-26 LAB — CUP PACEART REMOTE DEVICE CHECK
Brady Statistic AP VP Percent: 1.36 %
Brady Statistic AP VS Percent: 86.95 %
Brady Statistic AS VP Percent: 0.24 %
Brady Statistic AS VS Percent: 11.44 %
Brady Statistic RA Percent Paced: 88.31 %
Implantable Lead Implant Date: 20070525
Implantable Lead Implant Date: 20070525
Implantable Lead Location: 753859
Implantable Lead Model: 5076
Implantable Lead Model: 5076
Lead Channel Impedance Value: 352 Ohm
Lead Channel Impedance Value: 376 Ohm
Lead Channel Sensing Intrinsic Amplitude: 2.031 mV
Lead Channel Sensing Intrinsic Amplitude: 2.156 mV
Lead Channel Setting Pacing Amplitude: 2 V
Lead Channel Setting Pacing Amplitude: 2.5 V
Lead Channel Setting Sensing Sensitivity: 0.9 mV
MDC IDC LEAD LOCATION: 753860
MDC IDC MSMT BATTERY VOLTAGE: 2.83 V
MDC IDC SESS DTM: 20170331124133
MDC IDC SET LEADCHNL RV PACING PULSEWIDTH: 0.4 ms
MDC IDC STAT BRADY RV PERCENT PACED: 1.6 %

## 2015-07-29 ENCOUNTER — Ambulatory Visit (INDEPENDENT_AMBULATORY_CARE_PROVIDER_SITE_OTHER): Payer: Medicare Other | Admitting: *Deleted

## 2015-07-29 DIAGNOSIS — Z950 Presence of cardiac pacemaker: Secondary | ICD-10-CM

## 2015-07-29 NOTE — Progress Notes (Signed)
Remote pacemaker transmission.   

## 2015-07-30 ENCOUNTER — Encounter: Payer: Self-pay | Admitting: Cardiology

## 2015-07-31 ENCOUNTER — Ambulatory Visit (INDEPENDENT_AMBULATORY_CARE_PROVIDER_SITE_OTHER): Payer: Medicare Other

## 2015-07-31 DIAGNOSIS — Z7901 Long term (current) use of anticoagulants: Secondary | ICD-10-CM

## 2015-07-31 DIAGNOSIS — I4891 Unspecified atrial fibrillation: Secondary | ICD-10-CM

## 2015-07-31 LAB — POCT INR: INR: 5

## 2015-08-05 ENCOUNTER — Ambulatory Visit: Payer: Medicare Other | Admitting: Family Medicine

## 2015-08-06 ENCOUNTER — Ambulatory Visit (INDEPENDENT_AMBULATORY_CARE_PROVIDER_SITE_OTHER): Payer: Medicare Other | Admitting: *Deleted

## 2015-08-06 DIAGNOSIS — E538 Deficiency of other specified B group vitamins: Secondary | ICD-10-CM | POA: Diagnosis not present

## 2015-08-06 MED ORDER — CYANOCOBALAMIN 1000 MCG/ML IJ SOLN
1000.0000 ug | Freq: Once | INTRAMUSCULAR | Status: AC
  Administered 2015-08-06: 1000 ug via INTRAMUSCULAR

## 2015-08-07 ENCOUNTER — Ambulatory Visit (INDEPENDENT_AMBULATORY_CARE_PROVIDER_SITE_OTHER): Payer: Medicare Other

## 2015-08-07 DIAGNOSIS — Z7901 Long term (current) use of anticoagulants: Secondary | ICD-10-CM

## 2015-08-07 DIAGNOSIS — I4891 Unspecified atrial fibrillation: Secondary | ICD-10-CM

## 2015-08-07 LAB — POCT INR: INR: 3.4

## 2015-08-23 ENCOUNTER — Ambulatory Visit (INDEPENDENT_AMBULATORY_CARE_PROVIDER_SITE_OTHER): Payer: Medicare Other

## 2015-08-23 DIAGNOSIS — I4891 Unspecified atrial fibrillation: Secondary | ICD-10-CM | POA: Diagnosis not present

## 2015-08-23 DIAGNOSIS — Z7901 Long term (current) use of anticoagulants: Secondary | ICD-10-CM | POA: Diagnosis not present

## 2015-08-23 LAB — POCT INR: INR: 3

## 2015-08-29 ENCOUNTER — Ambulatory Visit (INDEPENDENT_AMBULATORY_CARE_PROVIDER_SITE_OTHER): Payer: Medicare Other | Admitting: *Deleted

## 2015-08-29 DIAGNOSIS — I495 Sick sinus syndrome: Secondary | ICD-10-CM

## 2015-08-30 NOTE — Progress Notes (Signed)
Remote pacemaker transmission.   

## 2015-09-04 ENCOUNTER — Ambulatory Visit (INDEPENDENT_AMBULATORY_CARE_PROVIDER_SITE_OTHER): Payer: Medicare Other

## 2015-09-04 DIAGNOSIS — I4891 Unspecified atrial fibrillation: Secondary | ICD-10-CM | POA: Diagnosis not present

## 2015-09-04 DIAGNOSIS — Z7901 Long term (current) use of anticoagulants: Secondary | ICD-10-CM | POA: Diagnosis not present

## 2015-09-04 DIAGNOSIS — I480 Paroxysmal atrial fibrillation: Secondary | ICD-10-CM | POA: Diagnosis not present

## 2015-09-04 LAB — POCT INR: INR: 2.1

## 2015-09-05 ENCOUNTER — Ambulatory Visit: Payer: Medicare Other

## 2015-09-06 ENCOUNTER — Ambulatory Visit (INDEPENDENT_AMBULATORY_CARE_PROVIDER_SITE_OTHER): Payer: Medicare Other | Admitting: *Deleted

## 2015-09-06 ENCOUNTER — Encounter: Payer: Self-pay | Admitting: Cardiology

## 2015-09-06 DIAGNOSIS — E538 Deficiency of other specified B group vitamins: Secondary | ICD-10-CM

## 2015-09-06 LAB — CUP PACEART REMOTE DEVICE CHECK
Brady Statistic AP VP Percent: 1.3 %
Brady Statistic AP VS Percent: 85.91 %
Brady Statistic AS VP Percent: 0.67 %
Brady Statistic AS VS Percent: 12.12 %
Date Time Interrogation Session: 20170501130015
Implantable Lead Implant Date: 20070525
Implantable Lead Implant Date: 20070525
Implantable Lead Location: 753859
Implantable Lead Model: 5076
Lead Channel Impedance Value: 368 Ohm
Lead Channel Sensing Intrinsic Amplitude: 2.07 mV
Lead Channel Setting Pacing Amplitude: 2 V
Lead Channel Setting Pacing Amplitude: 2.5 V
Lead Channel Setting Pacing Pulse Width: 0.4 ms
MDC IDC LEAD LOCATION: 753860
MDC IDC MSMT BATTERY VOLTAGE: 2.82 V
MDC IDC MSMT LEADCHNL RV IMPEDANCE VALUE: 384 Ohm
MDC IDC SET LEADCHNL RV SENSING SENSITIVITY: 0.9 mV
MDC IDC STAT BRADY RA PERCENT PACED: 87.21 %
MDC IDC STAT BRADY RV PERCENT PACED: 1.97 %

## 2015-09-06 MED ORDER — CYANOCOBALAMIN 1000 MCG/ML IJ SOLN
1000.0000 ug | Freq: Once | INTRAMUSCULAR | Status: AC
  Administered 2015-09-06: 1000 ug via INTRAMUSCULAR

## 2015-09-12 LAB — CUP PACEART REMOTE DEVICE CHECK
Battery Voltage: 2.85 V
Brady Statistic AP VS Percent: 94.59 %
Brady Statistic AS VS Percent: 3.86 %
Implantable Lead Implant Date: 20070525
Implantable Lead Location: 753860
Implantable Lead Model: 5076
Lead Channel Impedance Value: 392 Ohm
Lead Channel Sensing Intrinsic Amplitude: 1.466 mV
Lead Channel Sensing Intrinsic Amplitude: 1.693 mV
MDC IDC LEAD IMPLANT DT: 20070525
MDC IDC LEAD LOCATION: 753859
MDC IDC MSMT LEADCHNL RA IMPEDANCE VALUE: 400 Ohm
MDC IDC SESS DTM: 20170601123630
MDC IDC SET LEADCHNL RA PACING AMPLITUDE: 2 V
MDC IDC SET LEADCHNL RV PACING AMPLITUDE: 2.5 V
MDC IDC SET LEADCHNL RV PACING PULSEWIDTH: 0.4 ms
MDC IDC SET LEADCHNL RV SENSING SENSITIVITY: 0.9 mV
MDC IDC STAT BRADY AP VP PERCENT: 1.26 %
MDC IDC STAT BRADY AS VP PERCENT: 0.29 %
MDC IDC STAT BRADY RA PERCENT PACED: 95.85 %
MDC IDC STAT BRADY RV PERCENT PACED: 1.55 %

## 2015-09-17 DIAGNOSIS — H401132 Primary open-angle glaucoma, bilateral, moderate stage: Secondary | ICD-10-CM | POA: Diagnosis not present

## 2015-09-18 ENCOUNTER — Encounter: Payer: Self-pay | Admitting: Cardiology

## 2015-09-25 ENCOUNTER — Ambulatory Visit (INDEPENDENT_AMBULATORY_CARE_PROVIDER_SITE_OTHER): Payer: Medicare Other | Admitting: *Deleted

## 2015-09-25 DIAGNOSIS — I4891 Unspecified atrial fibrillation: Secondary | ICD-10-CM | POA: Diagnosis not present

## 2015-09-25 DIAGNOSIS — Z7901 Long term (current) use of anticoagulants: Secondary | ICD-10-CM

## 2015-09-25 LAB — POCT INR: INR: 2.3

## 2015-09-26 ENCOUNTER — Other Ambulatory Visit: Payer: Self-pay

## 2015-09-26 MED ORDER — ALENDRONATE SODIUM 70 MG PO TABS: 70.0000 mg | ORAL_TABLET | ORAL | Status: DC

## 2015-09-26 NOTE — Telephone Encounter (Signed)
Pt left note requesting refill alendronate to [facility]; last annual 01/24/15. Refill done per protocol and pt notified done

## 2015-10-04 ENCOUNTER — Ambulatory Visit: Payer: Medicare Other

## 2015-10-08 ENCOUNTER — Ambulatory Visit (INDEPENDENT_AMBULATORY_CARE_PROVIDER_SITE_OTHER): Payer: Medicare Other

## 2015-10-08 ENCOUNTER — Ambulatory Visit (INDEPENDENT_AMBULATORY_CARE_PROVIDER_SITE_OTHER): Payer: Medicare Other | Admitting: Internal Medicine

## 2015-10-08 ENCOUNTER — Encounter: Payer: Self-pay | Admitting: Internal Medicine

## 2015-10-08 VITALS — BP 118/78 | HR 72 | Ht 69.0 in | Wt 145.2 lb

## 2015-10-08 DIAGNOSIS — E538 Deficiency of other specified B group vitamins: Secondary | ICD-10-CM

## 2015-10-08 DIAGNOSIS — I480 Paroxysmal atrial fibrillation: Secondary | ICD-10-CM

## 2015-10-08 DIAGNOSIS — I495 Sick sinus syndrome: Secondary | ICD-10-CM

## 2015-10-08 DIAGNOSIS — Z950 Presence of cardiac pacemaker: Secondary | ICD-10-CM

## 2015-10-08 MED ORDER — CYANOCOBALAMIN 1000 MCG/ML IJ SOLN
1000.0000 ug | Freq: Once | INTRAMUSCULAR | Status: AC
  Administered 2015-10-08: 1000 ug via INTRAMUSCULAR

## 2015-10-08 NOTE — Patient Instructions (Signed)
Medication Instructions: - Your physician recommends that you continue on your current medications as directed. Please refer to the Current Medication list given to you today.  Labwork: - Your physician recommends that you have lab work today: liver/ TSH  Procedures/Testing: - none  Follow-Up: - Remote monitoring is used to monitor your Pacemaker of ICD from home. This monitoring reduces the number of office visits required to check your device to one time per year. It allows [place] to keep an eye on the functioning of your device to ensure it is working properly. You are scheduled for a device check from home on 01/07/16. You may send your transmission at any time that day. If you have a wireless device, the transmission will be sent automatically. After your physician reviews your transmission, you will receive a postcard with your next transmission date.  - Your physician wants you to follow-up in: 1 year with Dr. Caryl Comes. You will receive a reminder letter in the mail two months in advance. If you don't receive a letter, please call our office to schedule the follow-up appointment.  Any Additional Special Instructions Will Be Listed Below (If Applicable).     If you need a refill on your cardiac medications before your next appointment, please call your pharmacy.

## 2015-10-08 NOTE — Progress Notes (Signed)
Electrophysiology Office Note   Date:  10/08/2015   ID:  SHANNAN EMANUEL, DOB 01/15/1923, MRN [id number]  PCP:  Owens Loffler, MD  Cardiologist:  Primary Electrophysiologist:  Virl Axe, MD    Chief Complaint  Patient presents with  . other    6 month f/u. Meds reviewed verbally with pt.     History of Present Illness: [name] [name] is a 82 y.o. male is seen today in followup for  pacemaker implanted for tachybradycardia syndrome in  the context of paroxysmal atrial fibrillation with prior failed ablation. He takes amiodarone.  He has a history of orthostasis in the context of hypertension. No recent problems with dizziness.  Denies chest pain shortness of breath  His last TSH in the fall was 8.77. Free T3 and free to 4 word the lower limits of normal.  10/16 TSH 8.77 SGOT 33  3/17 TSH 4.92 4/17 TSH 1.25 SGOT      .   Patient denies symptoms of GI intolerance, sun sensitivity, neurological symptoms attributable to amiodarone.     Past Medical History  Diagnosis Date  . GI hemorrhage   . Radiation proctitis   . 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 Paroxysmal atrial fibrillation ([name])     s/p failed ablation  . Cardiac pacemaker in situ 07/2005    symptomatic bradycardia  . Hyperlipidemia   . Hypertension   . GERD (gastroesophageal reflux disease)   . Unspecified glaucoma   . Vitamin B12 deficiency   . Peptic ulcer, unspecified site, unspecified as acute or chronic, without mention of hemorrhage, perforation, or obstruction 1975  . Personal history of malignant neoplasm of prostate 12/2000    5 weeks radiation, radiation seeds  . Diverticulosis of colon (without mention of hemorrhage)   . Unspecified hypothyroidism   . 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 Sinoatrial node dysfunction ([name])   . Neck arthritis, Severe, multi-level 06/09/2013   Past Surgical History  Procedure Laterality Date  . Hernia repair  1994,  2001, 2003    s/p drainage and complications, 123456, [id number] mesh removal  . Insert / replace / remove pacemaker  07/2005    symptomatic bradycardia  . Cardiac electrophysiology mapping and ablation  2001, 2003    failure  . Cataract extraction    . Cardiac catheterization    . Tooth extraction    . Nose surgery      cancer removed      Current Outpatient Prescriptions  Medication Sig Dispense Refill  . alendronate (FOSAMAX) 70 MG tablet Take 1 tablet (70 mg total) by mouth every 7 (seven) days. Take with a full glass of water on an empty stomach. 12 tablet 1  . amiodarone (PACERONE) 200 MG tablet Take 0.5 tablets (100 mg total) by mouth daily. 135 tablet 3  . clindamycin (CLEOCIN) 300 MG capsule Take by mouth. 2 tablets by mouth 1 hour prior to dental procedure    . Cyanocobalamin (VITAMIN B-12 IJ) Inject 1,000 mcg/mL as directed every 30 (thirty) days.      . dorzolamide-timolol (COSOPT) 22.3-6.8 MG/ML ophthalmic solution Place 1 drop into both eyes 2 (two) times daily.      . ferrous sulfate 325 (65 FE) MG EC tablet Take 325 mg by mouth daily with breakfast.      . fluocinonide ointment (LIDEX) [id number] % Apply 1 application topically 2 (two) times daily. 60 g 1  . levothyroxine (SYNTHROID, LEVOTHROID)  112 MCG tablet Take 1 tablet (112 mcg total) by mouth daily. 90 tablet 3  . Multiple Vitamin (MULTIVITAMIN) tablet Take 1 tablet by mouth daily.      . pantoprazole (PROTONIX) 40 MG tablet Take 1 tablet (40 mg total) by mouth daily. 90 tablet 3  . pravastatin (PRAVACHOL) 40 MG tablet Take 0.5 tablets (20 mg total) by mouth daily. 90 tablet 3  . torsemide (DEMADEX) 20 MG tablet Take 1 tablet (20 mg total) by mouth daily as needed. 90 tablet 3  . warfarin (COUMADIN) 4 MG tablet Take as directed by coumadin clinic 90 tablet 1  . Wheat Dextrin (BENEFIBER DRINK MIX PO) Take by mouth as directed.       No current facility-administered medications for this visit.    Allergies:   Penicillins    Social History:  The patient  reports that he has never smoked. He has never used smokeless tobacco. He reports that he does not drink alcohol or use illicit drugs.   Family History:  The patient's    family history includes Bone cancer in his father; Breast cancer in his mother.    ROS:  Please see the history of present illness and past medical histor .   All other systems are reviewed and negative.    PHYSICAL EXAM: VS:  BP 118/78 mmHg  Pulse 72  Ht 5\' 9"  (1.753 m)  Wt 145 lb 4 oz (65.885 kg)  BMI 21.44 kg/m2 , BMI Body mass index is 21.44 kg/(m^2). GEN: Well nourished, well developed, in no acute distress HEENT: normal Neck:  JVD flat , carotid bruits, or masses Cardiac: REGULAR RATE and RHYTHM ; 2/6 murmurs, rubs, No S4  Back without kyphosis or CVAT Respiratory:  clear to auscultation bilaterally, normal work of breathing GI: soft, nontender, nondistended, + BS MS: no deformity or atrophy Extremities no clubbing cyanosis  edema Skin: warm and dry,  device pocket is well healed without teathering Neuro:  Strength and sensation are intact Psych: euthymic mood, full affect   ECG demonstrates atrial pacing at 72  Intervals 24/09/41    Device interrogation is reviewed today in detail.  See PaceArt for details.      Lipid Panel     Component Value Date/Time   CHOL 151 01/21/2015 0824   TRIG 71.0 01/21/2015 0824   HDL 65.60 01/21/2015 0824   CHOLHDL 2 01/21/2015 0824   VLDL 14.2 01/21/2015 0824   LDLCALC 72 01/21/2015 0824     Wt Readings from Last 3 Encounters:  10/08/15 145 lb 4 oz (65.885 kg)  06/03/15 151 lb 12 oz (68.833 kg)  04/30/15 150 lb (68.04 kg)      Other studies Reviewed: As above    ASSESSMENT AND PLAN:  Atrial fibrillation  = paroxysmal  Sinus node dysfunction   Pacemaker  Medtronic  The patient's device was interrogated.  The information was reviewed. No changes were made in the programming.     Hypothyroidism-- most recently  euthyroid  Amiodarone therapy       He is having infrequent recurrent atrial fibrillation. We will continue his amiodarone. Surveillance laboratories demonstrated hypothyroidism; his most recent delta TSH was quite steep. We will recheck his TSH today. We will also check LFTs.   No bleeding issues  We spent more than 50% of our >25 min visit in face to face counseling regarding the above        Orders Placed This Encounter  Procedures  . EKG 12-Lead  Disposition:   FU with me 6 months Signed, Virl Axe, MD  10/08/2015 11:05 AM     [address] [place] [address] [place] [address] [phone] (office) [phone] (fax)

## 2015-10-09 LAB — HEPATIC FUNCTION PANEL
ALBUMIN: 3.6 g/dL (ref 3.2–4.6)
ALT: 18 IU/L (ref 0–44)
AST: 25 IU/L (ref 0–40)
Alkaline Phosphatase: 110 IU/L (ref 39–117)
Bilirubin Total: 0.7 mg/dL (ref 0.0–1.2)
Bilirubin, Direct: 0.24 mg/dL (ref 0.00–0.40)
TOTAL PROTEIN: 6 g/dL (ref 6.0–8.5)

## 2015-10-09 LAB — TSH: TSH: 0.709 u[IU]/mL (ref 0.450–4.500)

## 2015-10-14 LAB — CUP PACEART INCLINIC DEVICE CHECK
Brady Statistic AP VP Percent: 1.37 %
Brady Statistic AP VS Percent: 88.65 %
Brady Statistic AS VP Percent: 0.58 %
Brady Statistic RA Percent Paced: 90.02 %
Brady Statistic RV Percent Paced: 1.95 %
Date Time Interrogation Session: 20170711152558
Implantable Lead Implant Date: 20070525
Implantable Lead Location: 753859
Implantable Lead Model: 5076
Lead Channel Impedance Value: 376 Ohm
Lead Channel Setting Pacing Amplitude: 2 V
Lead Channel Setting Pacing Amplitude: 2.5 V
Lead Channel Setting Pacing Pulse Width: 0.4 ms
MDC IDC LEAD IMPLANT DT: 20070525
MDC IDC LEAD LOCATION: 753860
MDC IDC MSMT BATTERY VOLTAGE: 2.83 V
MDC IDC MSMT LEADCHNL RA SENSING INTR AMPL: 1.423 mV
MDC IDC MSMT LEADCHNL RV IMPEDANCE VALUE: 392 Ohm
MDC IDC MSMT LEADCHNL RV SENSING INTR AMPL: 4.063 mV
MDC IDC SET LEADCHNL RV SENSING SENSITIVITY: 0.9 mV
MDC IDC STAT BRADY AS VS PERCENT: 9.4 %

## 2015-10-17 ENCOUNTER — Encounter: Payer: Self-pay | Admitting: Internal Medicine

## 2015-10-21 DIAGNOSIS — Z1283 Encounter for screening for malignant neoplasm of skin: Secondary | ICD-10-CM | POA: Diagnosis not present

## 2015-10-21 DIAGNOSIS — Z872 Personal history of diseases of the skin and subcutaneous tissue: Secondary | ICD-10-CM | POA: Diagnosis not present

## 2015-10-21 DIAGNOSIS — L570 Actinic keratosis: Secondary | ICD-10-CM | POA: Diagnosis not present

## 2015-10-21 DIAGNOSIS — Z85828 Personal history of other malignant neoplasm of skin: Secondary | ICD-10-CM | POA: Diagnosis not present

## 2015-10-21 DIAGNOSIS — Z08 Encounter for follow-up examination after completed treatment for malignant neoplasm: Secondary | ICD-10-CM | POA: Diagnosis not present

## 2015-10-23 ENCOUNTER — Ambulatory Visit (INDEPENDENT_AMBULATORY_CARE_PROVIDER_SITE_OTHER): Payer: Medicare Other

## 2015-10-23 DIAGNOSIS — Z7901 Long term (current) use of anticoagulants: Secondary | ICD-10-CM | POA: Diagnosis not present

## 2015-10-23 DIAGNOSIS — I4891 Unspecified atrial fibrillation: Secondary | ICD-10-CM

## 2015-10-23 LAB — POCT INR: INR: 2.1

## 2015-11-12 ENCOUNTER — Ambulatory Visit (INDEPENDENT_AMBULATORY_CARE_PROVIDER_SITE_OTHER): Payer: Medicare Other

## 2015-11-12 DIAGNOSIS — E538 Deficiency of other specified B group vitamins: Secondary | ICD-10-CM | POA: Diagnosis not present

## 2015-11-12 MED ORDER — CYANOCOBALAMIN 1000 MCG/ML IJ SOLN
1000.0000 ug | Freq: Once | INTRAMUSCULAR | Status: AC
  Administered 2015-11-12: 1000 ug via INTRAMUSCULAR

## 2015-12-04 ENCOUNTER — Ambulatory Visit (INDEPENDENT_AMBULATORY_CARE_PROVIDER_SITE_OTHER): Payer: Medicare Other

## 2015-12-04 DIAGNOSIS — Z7901 Long term (current) use of anticoagulants: Secondary | ICD-10-CM

## 2015-12-04 DIAGNOSIS — I4891 Unspecified atrial fibrillation: Secondary | ICD-10-CM | POA: Diagnosis not present

## 2015-12-04 LAB — POCT INR: INR: 3.3

## 2015-12-13 ENCOUNTER — Telehealth: Payer: Self-pay

## 2015-12-13 MED ORDER — TORSEMIDE 20 MG PO TABS: 20.0000 mg | ORAL_TABLET | Freq: Every day | ORAL | 1 refills | Status: DC | PRN

## 2015-12-13 NOTE — Telephone Encounter (Signed)
Patient places a written request to send in a new R/x to his mail order pharmacy: [facility] for his Torsemide 20mg  1 po qd prn.  Last filled on 06/03/15 by Dr. Lorelei Pont for #90, 3rf to local pharmacy.    Torsemide 20mg  1 po qd prn #90 with 1 refill sent in today to [facility] for patient.    Notified patient that his medication R/X has been sent in.

## 2015-12-16 NOTE — Telephone Encounter (Signed)
Refill was sent in on 12/13/2015 by Leticia Penna.

## 2015-12-16 NOTE — Telephone Encounter (Signed)
Please assist

## 2015-12-17 ENCOUNTER — Ambulatory Visit (INDEPENDENT_AMBULATORY_CARE_PROVIDER_SITE_OTHER): Payer: Medicare Other

## 2015-12-17 DIAGNOSIS — E538 Deficiency of other specified B group vitamins: Secondary | ICD-10-CM

## 2015-12-17 MED ORDER — CYANOCOBALAMIN 1000 MCG/ML IJ SOLN
1000.0000 ug | Freq: Once | INTRAMUSCULAR | Status: AC
  Administered 2015-12-17: 1000 ug via INTRAMUSCULAR

## 2015-12-17 MED ORDER — CYANOCOBALAMIN 1000 MCG/ML IJ SOLN: 1000.0000 ug | Freq: Once | INTRAMUSCULAR | 0 refills | Status: DC

## 2015-12-23 ENCOUNTER — Other Ambulatory Visit: Payer: Self-pay | Admitting: Family Medicine

## 2015-12-23 DIAGNOSIS — E538 Deficiency of other specified B group vitamins: Secondary | ICD-10-CM

## 2015-12-23 DIAGNOSIS — Z79899 Other long term (current) drug therapy: Secondary | ICD-10-CM

## 2015-12-23 DIAGNOSIS — E559 Vitamin D deficiency, unspecified: Secondary | ICD-10-CM

## 2015-12-23 DIAGNOSIS — D509 Iron deficiency anemia, unspecified: Secondary | ICD-10-CM

## 2015-12-23 DIAGNOSIS — E785 Hyperlipidemia, unspecified: Secondary | ICD-10-CM

## 2015-12-25 ENCOUNTER — Other Ambulatory Visit (INDEPENDENT_AMBULATORY_CARE_PROVIDER_SITE_OTHER): Payer: Medicare Other

## 2015-12-25 DIAGNOSIS — E559 Vitamin D deficiency, unspecified: Secondary | ICD-10-CM | POA: Diagnosis not present

## 2015-12-25 DIAGNOSIS — E785 Hyperlipidemia, unspecified: Secondary | ICD-10-CM | POA: Diagnosis not present

## 2015-12-25 DIAGNOSIS — D509 Iron deficiency anemia, unspecified: Secondary | ICD-10-CM | POA: Diagnosis not present

## 2015-12-25 DIAGNOSIS — Z79899 Other long term (current) drug therapy: Secondary | ICD-10-CM | POA: Diagnosis not present

## 2015-12-25 DIAGNOSIS — E538 Deficiency of other specified B group vitamins: Secondary | ICD-10-CM

## 2015-12-25 LAB — CBC WITH DIFFERENTIAL/PLATELET
Basophils Absolute: 0 10*3/uL (ref 0.0–0.1)
Basophils Relative: 0.6 % (ref 0.0–3.0)
EOS ABS: 0.2 10*3/uL (ref 0.0–0.7)
Eosinophils Relative: 2.9 % (ref 0.0–5.0)
HEMATOCRIT: 36.4 % — AB (ref 39.0–52.0)
HEMOGLOBIN: 12.2 g/dL — AB (ref 13.0–17.0)
LYMPHS ABS: 1.3 10*3/uL (ref 0.7–4.0)
Lymphocytes Relative: 25 % (ref 12.0–46.0)
MCHC: 33.4 g/dL (ref 30.0–36.0)
MCV: 96.7 fl (ref 78.0–100.0)
MONO ABS: 0.5 10*3/uL (ref 0.1–1.0)
Monocytes Relative: 9.6 % (ref 3.0–12.0)
Neutro Abs: 3.2 10*3/uL (ref 1.4–7.7)
Neutrophils Relative %: 61.9 % (ref 43.0–77.0)
PLATELETS: 192 10*3/uL (ref 150.0–400.0)
RBC: 3.77 Mil/uL — ABNORMAL LOW (ref 4.22–5.81)
RDW: 14.4 % (ref 11.5–15.5)
WBC: 5.2 10*3/uL (ref 4.0–10.5)

## 2015-12-25 LAB — BASIC METABOLIC PANEL
BUN: 16 mg/dL (ref 6–23)
CALCIUM: 8.6 mg/dL (ref 8.4–10.5)
CO2: 30 mEq/L (ref 19–32)
Chloride: 109 mEq/L (ref 96–112)
Creatinine, Ser: 1.13 mg/dL (ref 0.40–1.50)
GFR: 64.69 mL/min (ref >60.00)
GLUCOSE: 88 mg/dL (ref 70–99)
Potassium: 4.3 mEq/L (ref 3.5–5.1)
SODIUM: 142 meq/L (ref 135–145)

## 2015-12-25 LAB — LIPID PANEL
CHOLESTEROL: 133 mg/dL (ref 0–200)
HDL: 63.7 mg/dL (ref >39.00)
LDL Cholesterol: 57 mg/dL (ref 0–99)
NonHDL: 68.88
Total CHOL/HDL Ratio: 2
Triglycerides: 60 mg/dL (ref 0.0–149.0)
VLDL: 12 mg/dL (ref 0.0–40.0)

## 2015-12-25 LAB — HEPATIC FUNCTION PANEL
ALT: 19 U/L (ref 0–53)
AST: 26 U/L (ref 0–37)
Albumin: 3.5 g/dL (ref 3.5–5.2)
Alkaline Phosphatase: 97 U/L (ref 39–117)
BILIRUBIN DIRECT: 0.1 mg/dL (ref 0.0–0.3)
BILIRUBIN TOTAL: 0.8 mg/dL (ref 0.2–1.2)
Total Protein: 6.5 g/dL (ref 6.0–8.3)

## 2015-12-25 LAB — VITAMIN B12: Vitamin B-12: 617 pg/mL (ref 211–911)

## 2015-12-25 LAB — VITAMIN D 25 HYDROXY (VIT D DEFICIENCY, FRACTURES): VITD: 33.08 ng/mL (ref 30.00–100.00)

## 2015-12-25 LAB — FERRITIN: Ferritin: 131 ng/mL (ref 22.0–322.0)

## 2015-12-30 ENCOUNTER — Ambulatory Visit (INDEPENDENT_AMBULATORY_CARE_PROVIDER_SITE_OTHER): Payer: Medicare Other | Admitting: Family Medicine

## 2015-12-30 ENCOUNTER — Encounter: Payer: Self-pay | Admitting: Family Medicine

## 2015-12-30 VITALS — BP 109/66 | HR 65 | Temp 97.4°F | Ht 69.0 in | Wt 142.8 lb

## 2015-12-30 DIAGNOSIS — E038 Other specified hypothyroidism: Secondary | ICD-10-CM | POA: Diagnosis not present

## 2015-12-30 DIAGNOSIS — R42 Dizziness and giddiness: Secondary | ICD-10-CM | POA: Diagnosis not present

## 2015-12-30 DIAGNOSIS — E782 Mixed hyperlipidemia: Secondary | ICD-10-CM | POA: Diagnosis not present

## 2015-12-30 DIAGNOSIS — H832X9 Labyrinthine dysfunction, unspecified ear: Secondary | ICD-10-CM

## 2015-12-30 DIAGNOSIS — I10 Essential (primary) hypertension: Secondary | ICD-10-CM

## 2015-12-30 DIAGNOSIS — E538 Deficiency of other specified B group vitamins: Secondary | ICD-10-CM

## 2015-12-30 NOTE — Patient Instructions (Signed)

## 2015-12-30 NOTE — Progress Notes (Signed)
Dr. Frederico Hamman T. Aida Lemaire, MD, [facility] [place], [address] Phone: [phone] Fax: [phone]  12/30/2015  Patient: [name] [name], MRN: [id number], DOB: [birth year], 82 y.o.  Primary Physician:  Owens Loffler, MD   Chief Complaint  Patient presents with  . Follow-up    6 month   Subjective:   [name] [name] is a 82 y.o. very pleasant male patient who presents with the following:  F/u: Some trouble walking a straight line - vision is going ok  Goes out for dinner most days and sometimes for breakfast.  Eats in for lunch.   Problem walking and cannot walk a staight line He does feel like he is getting dizzy sometimes, and, notably when he gets out of bed, and also when he is turning over sometimes.  HTN: Tolerating all medications without side effects Stable and at goal No CP, no sob. No HA.  BP Readings from Last 3 Encounters:  12/30/15 109/66  10/08/15 118/78  06/03/15 (!) 671/24    Basic Metabolic Panel:    Component Value Date/Time   NA 142 12/25/2015 0833   K 4.3 12/25/2015 0833   CL 109 12/25/2015 0833   CO2 30 12/25/2015 0833   BUN 16 12/25/2015 0833   CREATININE 1.13 12/25/2015 0833   GLUCOSE 88 12/25/2015 0833   CALCIUM 8.6 12/25/2015 0833    Thyroid: No symptoms. Labs reviewed. Denies cold / heat intolerance, dry skin, hair loss. No goiter.  Lab Results  Component Value Date   TSH 0.709 10/08/2015     Past Medical History, Surgical History, Social History, Family History, Problem List, Medications, and Allergies have been reviewed and updated if relevant.  Patient Active Problem List   Diagnosis Date Noted  . PACEMAKER, PERMANENT 08/07/2008    Priority: Medium  . PAROXYSMAL ATRIAL FIBRILLATION 02/06/2008    Priority: Medium  . DDD (degenerative disc disease), cervical, Severe 06/09/2013  . Neck arthritis, Severe, multi-level 06/09/2013  . Sinoatrial node dysfunction ([name])     . Long term (current) use of anticoagulants 06/24/2010  . IRON DEFICIENCY 04/17/2010  . GLAUCOMA 08/07/2008  . GERD 08/07/2008  . Vitamin B 12 deficiency 07/19/2008  . PEPTIC ULCER DISEASE 07/18/2008  . RADIATION PROCTITIS 07/18/2008  . DIVERTICULOSIS, COLON 07/18/2008  . Prostate cancer, in remission 07/18/2008  . HYPERTENSION, ESSENTIAL NOS 05/09/2008  . Hyperlipidemia 02/06/2008  . Hypothyroidism 12/01/2007  . HERN UNS SITE ABD CAV W/O MENTION OBST/GANGREN 12/01/2007    Past Medical History:  Diagnosis Date  . Cardiac pacemaker in situ 07/2005   symptomatic bradycardia  . Diverticulosis of colon (without mention of hemorrhage)   . 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 GERD (gastroesophageal reflux disease)   . GI hemorrhage   . Hyperlipidemia   . Hypertension   . Neck arthritis, Severe, multi-level 06/09/2013  . Paroxysmal atrial fibrillation ([name])    s/p failed ablation  . Peptic ulcer, unspecified site, unspecified as acute or chronic, without mention of hemorrhage, perforation, or obstruction 1975  . Personal history of malignant neoplasm of prostate 12/2000   5 weeks radiation, radiation seeds  . Radiation proctitis   . Sinoatrial node dysfunction ([name])   . Unspecified glaucoma(365.9)   . Unspecified hypothyroidism   . Vitamin B12 deficiency     Past Surgical History:  Procedure Laterality Date  . CARDIAC CATHETERIZATION    .  CARDIAC ELECTROPHYSIOLOGY [place] AND ABLATION  2001, 2003   failure  . CATARACT EXTRACTION    . HERNIA REPAIR  1994, 2001, 2003   s/p drainage and complications, 03/8561, 03/4968 mesh removal  . INSERT / REPLACE / REMOVE PACEMAKER  07/2005   symptomatic bradycardia  . NOSE SURGERY     cancer removed   . TOOTH EXTRACTION      Social History   Social History  . Marital status: Widowed    Spouse name: N/A  . Number of children: N/A  . Years of education: N/A    Occupational History  . Not on file.   Social History Main Topics  . Smoking status: Never Smoker  . Smokeless tobacco: Never Used  . Alcohol use No  . Drug use: No  . Sexual activity: Not on file   Other Topics Concern  . Not on file   Social History Narrative  . No narrative on file    Family History  Problem Relation Age of Onset  . Breast cancer Mother   . Bone cancer Father   . Alcohol abuse    . Coronary artery disease    . Dementia    . Diabetes      Allergies  Allergen Reactions  . Penicillins     Medication list reviewed and updated in full in [place].   GEN: No acute illnesses, no fevers, chills. GI: No n/v/d, eating normally Pulm: No SOB Interactive and getting along well at home.  Otherwise, ROS is as per the HPI.  Objective:   BP 109/66   Pulse 65   Temp 97.4 F (36.3 C) (Oral)   Ht 5\' 9"  (1.753 m)   Wt 142 lb 12 oz (64.8 kg)   BMI 21.08 kg/m   GEN: WDWN, NAD, Non-toxic, A & O x 3 HEENT: Atraumatic, Normocephalic. Neck supple. No masses, No LAD. Ears and Nose: No external deformity. CV: RRR, No M/G/R. No JVD. No thrill. No extra heart sounds. PULM: CTA B, no wheezes, crackles, rhonchi. No retractions. No resp. distress. No accessory muscle use. EXTR: No c/c/e NEURO Normal gait.  PSYCH: Normally interactive. Conversant. Not depressed or anxious appearing.  Calm demeanor.   Neuro: CN 2-12 grossly intact. PERRLA. EOMI. Sensation intact throughout. Str 5/5 all extremities. DTR 2+. No clonus. A and o x 4. Romberg neg. Finger nose neg. Difficulty walking heel to toe.    Laboratory and Imaging Data: Results for orders placed or performed in visit on 12/25/15  Lipid panel  Result Value Ref Range   Cholesterol 133 0 - 200 mg/dL   Triglycerides 60.0 0.0 - 149.0 mg/dL   HDL 63.70 >39.00 mg/dL   VLDL 12.0 0.0 - 40.0 mg/dL   LDL Cholesterol 57 0 - 99 mg/dL   Total CHOL/HDL Ratio 2    NonHDL 68.88   Hepatic function panel   Result Value Ref Range   Total Bilirubin 0.8 0.2 - 1.2 mg/dL   Bilirubin, Direct 0.1 0.0 - 0.3 mg/dL   Alkaline Phosphatase 97 39 - 117 U/L   AST 26 0 - 37 U/L   ALT 19 0 - 53 U/L   Total Protein 6.5 6.0 - 8.3 g/dL   Albumin 3.5 3.5 - 5.2 g/dL  Basic metabolic panel  Result Value Ref Range   Sodium 142 135 - 145 mEq/L   Potassium 4.3 3.5 - 5.1 mEq/L   Chloride 109 96 - 112 mEq/L   CO2 30 19 - 32  mEq/L   Glucose, Bld 88 70 - 99 mg/dL   BUN 16 6 - 23 mg/dL   Creatinine, Ser 1.13 0.40 - 1.50 mg/dL   Calcium 8.6 8.4 - 10.5 mg/dL   GFR 64.69 >60.00 mL/min  CBC with Differential/Platelet  Result Value Ref Range   WBC 5.2 4.0 - 10.5 K/uL   RBC 3.77 (L) 4.22 - 5.81 Mil/uL   Hemoglobin 12.2 (L) 13.0 - 17.0 g/dL   HCT 36.4 (L) 39.0 - 52.0 %   MCV 96.7 78.0 - 100.0 fl   MCHC 33.4 30.0 - 36.0 g/dL   RDW 14.4 11.5 - 15.5 %   Platelets 192.0 150.0 - 400.0 K/uL   Neutrophils Relative % 61.9 43.0 - 77.0 %   Lymphocytes Relative 25.0 12.0 - 46.0 %   Monocytes Relative 9.6 3.0 - 12.0 %   Eosinophils Relative 2.9 0.0 - 5.0 %   Basophils Relative 0.6 0.0 - 3.0 %   Neutro Abs 3.2 1.4 - 7.7 K/uL   Lymphs Abs 1.3 0.7 - 4.0 K/uL   Monocytes Absolute 0.5 0.1 - 1.0 K/uL   Eosinophils Absolute 0.2 0.0 - 0.7 K/uL   Basophils Absolute 0.0 0.0 - 0.1 K/uL  Vitamin B12  Result Value Ref Range   Vitamin B-12 617 211 - 911 pg/mL  VITAMIN D 25 Hydroxy (Vit-D Deficiency, Fractures)  Result Value Ref Range   VITD 33.08 30.00 - 100.00 ng/mL  Ferritin  Result Value Ref Range   Ferritin 131.0 22.0 - 322.0 ng/mL     Assessment and Plan:   Dizziness and giddiness - Plan: Ambulatory referral to ENT  Balance problem due to vestibular dysfunction, unspecified laterality - Plan: Ambulatory referral to ENT  Mixed hyperlipidemia  Other specified hypothyroidism  Essential hypertension  Vitamin B 12 deficiency   With history of examination, mostly suspicious for inner ear dysfunction and vestibular  issue causing balance problem.  Cholesterol and hypertension are stable. B12 is stable. Thyroid has been stable.  Follow-up: 6 months for Medicare Wellness  Orders Placed This Encounter  Procedures  . Ambulatory referral to ENT    Signed,  Frederico Hamman T. Stephanine Reas, MD   Patient's Medications  New Prescriptions   No medications on file  Previous Medications   ALENDRONATE (FOSAMAX) 70 MG TABLET    Take 1 tablet (70 mg total) by mouth every 7 (seven) days. Take with a full glass of water on an empty stomach.   AMIODARONE (PACERONE) 200 MG TABLET    Take 0.5 tablets (100 mg total) by mouth daily.   CLINDAMYCIN (CLEOCIN) 300 MG CAPSULE    Take by mouth. 2 tablets by mouth 1 hour prior to dental procedure   CYANOCOBALAMIN (VITAMIN B-12 IJ)    Inject 1,000 mcg/mL as directed every 30 (thirty) days.     DORZOLAMIDE-TIMOLOL (COSOPT) 22.3-6.8 MG/ML OPHTHALMIC SOLUTION    Place 1 drop into both eyes 2 (two) times daily.     FERROUS SULFATE 325 (65 FE) MG EC TABLET    Take 325 mg by mouth daily with breakfast.     FLUOCINONIDE OINTMENT (LIDEX) 0.05 %    Apply 1 application topically 2 (two) times daily.   LEVOTHYROXINE (SYNTHROID, LEVOTHROID) 112 MCG TABLET    Take 1 tablet (112 mcg total) by mouth daily.   MULTIPLE VITAMIN (MULTIVITAMIN) TABLET    Take 1 tablet by mouth daily.     PANTOPRAZOLE (PROTONIX) 40 MG TABLET    Take 1 tablet (40 mg total) by mouth  daily.   PRAVASTATIN (PRAVACHOL) 40 MG TABLET    Take 0.5 tablets (20 mg total) by mouth daily.   TORSEMIDE (DEMADEX) 20 MG TABLET    Take 1 tablet (20 mg total) by mouth daily as needed.   WARFARIN (COUMADIN) 4 MG TABLET    Take as directed by coumadin clinic   WHEAT DEXTRIN (BENEFIBER DRINK MIX PO)    Take by mouth as directed.    Modified Medications   No medications on file  Discontinued Medications   No medications on file

## 2015-12-30 NOTE — Progress Notes (Signed)
Pre visit review using our clinic review tool, if applicable. No additional management support is needed unless otherwise documented below in the visit note. 

## 2016-01-01 ENCOUNTER — Ambulatory Visit (INDEPENDENT_AMBULATORY_CARE_PROVIDER_SITE_OTHER): Payer: Medicare Other

## 2016-01-01 DIAGNOSIS — I4891 Unspecified atrial fibrillation: Secondary | ICD-10-CM | POA: Diagnosis not present

## 2016-01-01 DIAGNOSIS — Z7901 Long term (current) use of anticoagulants: Secondary | ICD-10-CM | POA: Diagnosis not present

## 2016-01-01 LAB — POCT INR: INR: 2.8

## 2016-01-02 DIAGNOSIS — Z23 Encounter for immunization: Secondary | ICD-10-CM | POA: Diagnosis not present

## 2016-01-07 ENCOUNTER — Telehealth: Payer: Self-pay | Admitting: Cardiology

## 2016-01-07 ENCOUNTER — Telehealth: Payer: Self-pay

## 2016-01-07 ENCOUNTER — Ambulatory Visit (INDEPENDENT_AMBULATORY_CARE_PROVIDER_SITE_OTHER): Payer: Medicare Other | Admitting: *Deleted

## 2016-01-07 DIAGNOSIS — I495 Sick sinus syndrome: Secondary | ICD-10-CM

## 2016-01-07 NOTE — Telephone Encounter (Signed)
Called pt to let him know that his pacemaker had reached ERI and that he would need an appointment with Dr. Caryl Comes to discuss generator change. Pt agreeable to see Dr. Caryl Comes 01/28/16 at 9:45 in [place] office.

## 2016-01-07 NOTE — Telephone Encounter (Signed)
Spoke with pt and reminded pt of remote transmission that is due today. Pt verbalized understanding. He is already aware of ERI and has appt w/ MD on 01-28-2016.

## 2016-01-07 NOTE — Progress Notes (Signed)
Remote pacemaker transmission.   

## 2016-01-08 ENCOUNTER — Encounter: Payer: Self-pay | Admitting: Cardiology

## 2016-01-10 DIAGNOSIS — R42 Dizziness and giddiness: Secondary | ICD-10-CM | POA: Diagnosis not present

## 2016-01-10 DIAGNOSIS — H6123 Impacted cerumen, bilateral: Secondary | ICD-10-CM | POA: Diagnosis not present

## 2016-01-21 ENCOUNTER — Ambulatory Visit (INDEPENDENT_AMBULATORY_CARE_PROVIDER_SITE_OTHER): Payer: Medicare Other

## 2016-01-21 DIAGNOSIS — E538 Deficiency of other specified B group vitamins: Secondary | ICD-10-CM | POA: Diagnosis not present

## 2016-01-21 MED ORDER — CYANOCOBALAMIN 1000 MCG/ML IJ SOLN
1000.0000 ug | Freq: Once | INTRAMUSCULAR | Status: AC
  Administered 2016-01-21: 1000 ug via INTRAMUSCULAR

## 2016-01-28 ENCOUNTER — Ambulatory Visit (INDEPENDENT_AMBULATORY_CARE_PROVIDER_SITE_OTHER): Payer: Medicare Other | Admitting: Internal Medicine

## 2016-01-28 ENCOUNTER — Encounter: Payer: Self-pay | Admitting: Internal Medicine

## 2016-01-28 VITALS — BP 134/76 | HR 65 | Ht 70.0 in | Wt 150.5 lb

## 2016-01-28 DIAGNOSIS — I480 Paroxysmal atrial fibrillation: Secondary | ICD-10-CM

## 2016-01-28 DIAGNOSIS — I495 Sick sinus syndrome: Secondary | ICD-10-CM | POA: Diagnosis not present

## 2016-01-28 DIAGNOSIS — Z950 Presence of cardiac pacemaker: Secondary | ICD-10-CM | POA: Diagnosis not present

## 2016-01-28 DIAGNOSIS — Z79899 Other long term (current) drug therapy: Secondary | ICD-10-CM | POA: Diagnosis not present

## 2016-01-28 LAB — CUP PACEART INCLINIC DEVICE CHECK
Brady Statistic AP VP Percent: 1.31 %
Brady Statistic AP VS Percent: 65.49 %
Brady Statistic AS VP Percent: 27.74 %
Brady Statistic AS VS Percent: 5.46 %
Date Time Interrogation Session: 20171031144327
Implantable Lead Implant Date: 20070525
Implantable Lead Location: 753860
Implantable Pulse Generator Implant Date: 20070525
Lead Channel Impedance Value: 384 Ohm
Lead Channel Pacing Threshold Amplitude: 1 V
Lead Channel Pacing Threshold Amplitude: 1 V
Lead Channel Pacing Threshold Pulse Width: 0.4 ms
Lead Channel Sensing Intrinsic Amplitude: 2.708 mV
Lead Channel Setting Sensing Sensitivity: 0.9 mV
MDC IDC LEAD IMPLANT DT: 20070525
MDC IDC LEAD LOCATION: 753859
MDC IDC MSMT BATTERY VOLTAGE: 2.78 V
MDC IDC MSMT LEADCHNL RA IMPEDANCE VALUE: 376 Ohm
MDC IDC MSMT LEADCHNL RA SENSING INTR AMPL: 3.579 mV
MDC IDC MSMT LEADCHNL RV PACING THRESHOLD PULSEWIDTH: 0.4 ms
MDC IDC SET LEADCHNL RA PACING AMPLITUDE: 2 V
MDC IDC SET LEADCHNL RV PACING AMPLITUDE: 2.5 V
MDC IDC SET LEADCHNL RV PACING PULSEWIDTH: 0.4 ms
MDC IDC STAT BRADY RA PERCENT PACED: 65.42 %
MDC IDC STAT BRADY RV PERCENT PACED: 28.79 %

## 2016-01-28 MED ORDER — LEVOTHYROXINE SODIUM 100 MCG PO TABS: 100.0000 ug | ORAL_TABLET | Freq: Every day | ORAL | 3 refills | Status: DC

## 2016-01-28 NOTE — Patient Instructions (Addendum)
Medication Instructions: - Your physician has recommended you make the following change in your medication:  1) Decrease synthroid (levothyroxine) to 100 mcg once daily 2) Take demadex (torsemide) 20 mg one tablet every other day until your battery change out on 11/27.  Labwork: - Your physician recommends that you return for lab work: the week of 11/20/17[facility] hospital lab- for pre-procedure lab work  Procedures/Testing: - Your physician has recommended that you have a pacemaker generator (battery).  Nira Conn will mail you a detailed letter of instructions for procedure date on Monday 02/24/16.  Follow-Up: - Your physician recommends that you schedule a follow-up appointment in: about 14 days (from 02/24/16) for a wound check with the device clinic.  Any Additional Special Instructions Will Be Listed Below (If Applicable).     If you need a refill on your cardiac medications before your next appointment, please call your pharmacy.

## 2016-01-28 NOTE — Progress Notes (Signed)
Electrophysiology Office Note   Date:  01/28/2016   ID:  [name] [name], DOB 09/15/1922, MRN [id number]  PCP:  Owens Loffler, MD  Cardiologist:  Primary Electrophysiologist:  Virl Axe, MD    Chief Complaint  Patient presents with  . other    Device check follow up. Patient states hes is doing well. Reviewed medications with patient verbally.     History of Present Illness: [name] [name] is a 82 y.o. male is seen today in followup for  pacemaker implanted for tachybradycardia syndrome in  the context of paroxysmal atrial fibrillation with prior failed ablation. He takes amiodarone. He has had mostly sinus ( 97%) from interrogation 7/17 He has a history of orthostasis in the context of hypertension. No recent problems with dizziness.  Denies chest pain shortness of breath  He has noted no change in functional status since reverting to ERI 10/10  His last TSH in the fall was 8.77. Free T3 and free to 4 word the lower limits of normal.  10/16 TSH 8.77 SGOT 33   Chart review synthroid increased 100>>112 3/17 TSH 4.92 4/17 TSH 1.25 SGOT  7/17 TSH 0.71    .   Patient denies symptoms of GI intolerance, sun sensitivity, neurological symptoms attributable to amiodarone.     Past Medical History:  Diagnosis Date  . Cardiac pacemaker in situ 07/2005   symptomatic bradycardia  . Diverticulosis of colon (without mention of hemorrhage)   . 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 GERD (gastroesophageal reflux disease)   . GI hemorrhage   . Hyperlipidemia   . Hypertension   . Neck arthritis, Severe, multi-level 06/09/2013  . Paroxysmal atrial fibrillation ([name])    s/p failed ablation  . Peptic ulcer, unspecified site, unspecified as acute or chronic, without mention of hemorrhage, perforation, or obstruction 1975  . Personal history of malignant neoplasm of prostate 12/2000   5 weeks radiation, radiation  seeds  . Radiation proctitis   . Sinoatrial node dysfunction ([name])   . Unspecified glaucoma(365.9)   . Unspecified hypothyroidism   . Vitamin B12 deficiency    Past Surgical History:  Procedure Laterality Date  . CARDIAC CATHETERIZATION    . CARDIAC ELECTROPHYSIOLOGY [place] AND ABLATION  2001, 2003   failure  . CATARACT EXTRACTION    . HERNIA REPAIR  1994, 2001, 2003   s/p drainage and complications, 06/6268, 05/5007 mesh removal  . INSERT / REPLACE / REMOVE PACEMAKER  07/2005   symptomatic bradycardia  . NOSE SURGERY     cancer removed   . TOOTH EXTRACTION       Current Outpatient Prescriptions  Medication Sig Dispense Refill  . alendronate (FOSAMAX) 70 MG tablet Take 1 tablet (70 mg total) by mouth every 7 (seven) days. Take with a full glass of water on an empty stomach. 12 tablet 1  . amiodarone (PACERONE) 200 MG tablet Take 0.5 tablets (100 mg total) by mouth daily. 135 tablet 3  . clindamycin (CLEOCIN) 300 MG capsule Take by mouth. 2 tablets by mouth 1 hour prior to dental procedure    . Cyanocobalamin (VITAMIN B-12 IJ) Inject 1,000 mcg/mL as directed every 30 (thirty) days.      . dorzolamide-timolol (COSOPT) 22.3-6.8 MG/ML ophthalmic solution Place 1 drop into both eyes 2 (two) times daily.      . ferrous sulfate 325 (65 FE) MG EC tablet Take 325 mg by mouth  daily with breakfast.      . fluocinonide ointment (LIDEX) 6.01 % Apply 1 application topically 2 (two) times daily. (Patient taking differently: Apply 1 application topically as needed. ) 60 g 1  . levothyroxine (SYNTHROID, LEVOTHROID) 112 MCG tablet Take 1 tablet (112 mcg total) by mouth daily. 90 tablet 3  . Multiple Vitamin (MULTIVITAMIN) tablet Take 1 tablet by mouth daily.      . pantoprazole (PROTONIX) 40 MG tablet Take 1 tablet (40 mg total) by mouth daily. 90 tablet 3  . pravastatin (PRAVACHOL) 40 MG tablet Take 0.5 tablets (20 mg total) by mouth daily. 90 tablet 3  . torsemide (DEMADEX) 20 MG tablet Take 1  tablet (20 mg total) by mouth daily as needed. 90 tablet 1  . warfarin (COUMADIN) 4 MG tablet Take as directed by coumadin clinic 90 tablet 1  . Wheat Dextrin (BENEFIBER DRINK MIX PO) Take by mouth as directed.       No current facility-administered medications for this visit.     Allergies:   Penicillins   Social History:  The patient  reports that he has never smoked. He has never used smokeless tobacco. He reports that he does not drink alcohol or use drugs.   Family History:  The patient's    family history includes Bone cancer in his father; Breast cancer in his mother.    ROS:  Please see the history of present illness and past medical histor .   All other systems are reviewed and negative.    PHYSICAL EXAM: VS:  BP 134/76 (BP Location: Left Arm, Patient Position: Sitting, Cuff Size: Normal)   Pulse 65   Ht 5\' 10"  (1.778 m)   Wt 150 lb 8 oz (68.3 kg)   BMI 21.59 kg/m  , BMI Body mass index is 21.59 kg/m. GEN: Well nourished, well developed, in no acute distress  HEENT: normal  Neck:  JVD flat , carotid bruits, or masses Cardiac: REGULAR RATE and RHYTHM ; 2/6 murmurs, rubs, No S4  Back without kyphosis or CVAT Respiratory:  clear to auscultation bilaterally, normal work of breathing GI: soft, nontender, nondistended, + BS MS: no deformity or atrophy  Extremities no clubbing cyanosis  edema Skin: warm and dry,  device pocket is well healed without teathering Neuro:  Strength and sensation are intact Psych: euthymic mood, full affect   ECG demonstrates atrial pacing at 72  Intervals 24/09/41    Device interrogation is reviewed today in detail.  See PaceArt for details.      Lipid Panel     Component Value Date/Time   CHOL 133 12/25/2015 0833   TRIG 60.0 12/25/2015 0833   HDL 63.70 12/25/2015 0833   CHOLHDL 2 12/25/2015 0833   VLDL 12.0 12/25/2015 0833   LDLCALC 57 12/25/2015 0833     Wt Readings from Last 3 Encounters:  01/28/16 150 lb 8 oz (68.3 kg)    12/30/15 142 lb 12 oz (64.8 kg)  10/08/15 145 lb 4 oz (65.9 kg)      Other studies Reviewed: As above    ASSESSMENT AND PLAN:  Atrial fibrillation  = paroxysmal  Sinus node dysfunction   Pacemaker  [employer]onic  The patient's device was interrogated.  The information was reviewed. His device has reached ERI. It was reprogrammed back to dual-chamber pacing mode  Hypothyroidism-- most recently euthyroid however decreasing TSH  Amiodarone therapy   His device has reached ERI.  We have reviewed the benefits and risks of generator  replacement.  These include but are not limited to lead fracture and infection.  The patient understands, agrees and is willing to proceed.    His recent delta TSH remains quite steep. We will decrease his Synthroid from 112--100.  We will check transaminases with his preoperative laboratories;  Tolerating amio  No signif Afib  No bleeding issues        Orders Placed This Encounter  Procedures  . EKG 12-Lead       Signed, Virl Axe, MD  01/28/2016 9:44 AM     [facility] [address] [place] [id number] [phone] (office) [phone] (fax)

## 2016-01-29 ENCOUNTER — Ambulatory Visit (INDEPENDENT_AMBULATORY_CARE_PROVIDER_SITE_OTHER): Payer: Medicare Other

## 2016-01-29 DIAGNOSIS — I4891 Unspecified atrial fibrillation: Secondary | ICD-10-CM

## 2016-01-29 DIAGNOSIS — Z7901 Long term (current) use of anticoagulants: Secondary | ICD-10-CM

## 2016-01-29 LAB — POCT INR: INR: 3.5

## 2016-01-31 DIAGNOSIS — H903 Sensorineural hearing loss, bilateral: Secondary | ICD-10-CM | POA: Diagnosis not present

## 2016-01-31 DIAGNOSIS — R42 Dizziness and giddiness: Secondary | ICD-10-CM | POA: Diagnosis not present

## 2016-02-07 ENCOUNTER — Other Ambulatory Visit: Payer: Self-pay | Admitting: *Deleted

## 2016-02-07 ENCOUNTER — Encounter: Payer: Self-pay | Admitting: *Deleted

## 2016-02-07 DIAGNOSIS — I495 Sick sinus syndrome: Secondary | ICD-10-CM

## 2016-02-07 DIAGNOSIS — Z01812 Encounter for preprocedural laboratory examination: Secondary | ICD-10-CM

## 2016-02-12 ENCOUNTER — Telehealth: Payer: Self-pay | Admitting: Internal Medicine

## 2016-02-12 ENCOUNTER — Ambulatory Visit (INDEPENDENT_AMBULATORY_CARE_PROVIDER_SITE_OTHER): Payer: Medicare Other

## 2016-02-12 DIAGNOSIS — Z7901 Long term (current) use of anticoagulants: Secondary | ICD-10-CM

## 2016-02-12 DIAGNOSIS — I4891 Unspecified atrial fibrillation: Secondary | ICD-10-CM

## 2016-02-12 LAB — POCT INR: INR: 2

## 2016-02-12 MED ORDER — WARFARIN SODIUM 4 MG PO TABS: ORAL_TABLET | ORAL | 1 refills | Status: DC

## 2016-02-12 MED ORDER — AMIODARONE HCL 200 MG PO TABS: 100.0000 mg | ORAL_TABLET | Freq: Every day | ORAL | 3 refills | Status: DC

## 2016-02-12 NOTE — Telephone Encounter (Signed)
Patient hasn't received his letter for the device surgery. Please call patient.

## 2016-02-13 NOTE — Telephone Encounter (Signed)
I placed the patient's letter in the mail box on 02/07/16. I attempted to call him - I left a message I will try him back later this afternoon.

## 2016-02-14 DIAGNOSIS — R42 Dizziness and giddiness: Secondary | ICD-10-CM | POA: Diagnosis not present

## 2016-02-14 DIAGNOSIS — H903 Sensorineural hearing loss, bilateral: Secondary | ICD-10-CM | POA: Diagnosis not present

## 2016-02-14 NOTE — Telephone Encounter (Signed)
I called and spoke with the patient. He states he did receive his letter the afternoon he left the message for me. He did not any questions. I clarify for him where he needed to go to check in. He voiced understanding.

## 2016-02-17 ENCOUNTER — Other Ambulatory Visit
Admission: RE | Admit: 2016-02-17 | Discharge: 2016-02-17 | Disposition: A | Discharge status: Home / Self Care | Payer: Medicare Other | Source: Ambulatory Visit | Attending: Internal Medicine | Admitting: Internal Medicine

## 2016-02-17 DIAGNOSIS — I495 Sick sinus syndrome: Secondary | ICD-10-CM | POA: Diagnosis not present

## 2016-02-17 DIAGNOSIS — Z01812 Encounter for preprocedural laboratory examination: Secondary | ICD-10-CM | POA: Diagnosis not present

## 2016-02-17 LAB — CBC WITH DIFFERENTIAL/PLATELET
Basophils Absolute: 0.1 10*3/uL (ref 0–0.1)
Basophils Relative: 1 %
EOS ABS: 0.1 10*3/uL (ref 0–0.7)
EOS PCT: 2 %
HCT: 37.9 % — ABNORMAL LOW (ref 40.0–52.0)
Hemoglobin: 12.8 g/dL — ABNORMAL LOW (ref 13.0–18.0)
LYMPHS ABS: 1.5 10*3/uL (ref 1.0–3.6)
Lymphocytes Relative: 26 %
MCH: 33.9 pg (ref 26.0–34.0)
MCHC: 33.7 g/dL (ref 32.0–36.0)
MCV: 100.7 fL — ABNORMAL HIGH (ref 80.0–100.0)
Monocytes Absolute: 0.4 10*3/uL (ref 0.2–1.0)
Monocytes Relative: 7 %
Neutro Abs: 3.8 10*3/uL (ref 1.4–6.5)
Neutrophils Relative %: 64 %
PLATELETS: 201 10*3/uL (ref 150–440)
RBC: 3.76 MIL/uL — AB (ref 4.40–5.90)
RDW: 13.5 % (ref 11.5–14.5)
WBC: 5.9 10*3/uL (ref 3.8–10.6)

## 2016-02-17 LAB — PROTIME-INR
INR: 2.53
PROTHROMBIN TIME: 27.7 s — AB (ref 11.4–15.2)

## 2016-02-19 DIAGNOSIS — R2681 Unsteadiness on feet: Secondary | ICD-10-CM | POA: Diagnosis not present

## 2016-02-24 ENCOUNTER — Encounter (HOSPITAL_COMMUNITY)
Admission: RE | Disposition: A | Discharge status: Home / Self Care | Payer: Self-pay | Source: Ambulatory Visit | Attending: Internal Medicine

## 2016-02-24 ENCOUNTER — Ambulatory Visit (HOSPITAL_COMMUNITY)
Admission: RE | Admit: 2016-02-24 | Discharge: 2016-02-24 | Disposition: A | Discharge status: Home / Self Care | Payer: Medicare Other | Source: Ambulatory Visit | Attending: Internal Medicine | Admitting: Internal Medicine

## 2016-02-24 DIAGNOSIS — Z950 Presence of cardiac pacemaker: Secondary | ICD-10-CM | POA: Diagnosis not present

## 2016-02-24 DIAGNOSIS — I495 Sick sinus syndrome: Secondary | ICD-10-CM | POA: Diagnosis present

## 2016-02-24 DIAGNOSIS — E785 Hyperlipidemia, unspecified: Secondary | ICD-10-CM | POA: Diagnosis not present

## 2016-02-24 DIAGNOSIS — Z7901 Long term (current) use of anticoagulants: Secondary | ICD-10-CM | POA: Insufficient documentation

## 2016-02-24 DIAGNOSIS — Z880 Allergy status to penicillin: Secondary | ICD-10-CM | POA: Insufficient documentation

## 2016-02-24 DIAGNOSIS — E039 Hypothyroidism, unspecified: Secondary | ICD-10-CM | POA: Diagnosis not present

## 2016-02-24 DIAGNOSIS — Z8546 Personal history of malignant neoplasm of prostate: Secondary | ICD-10-CM | POA: Insufficient documentation

## 2016-02-24 DIAGNOSIS — I480 Paroxysmal atrial fibrillation: Secondary | ICD-10-CM

## 2016-02-24 DIAGNOSIS — I10 Essential (primary) hypertension: Secondary | ICD-10-CM | POA: Diagnosis not present

## 2016-02-24 DIAGNOSIS — K219 Gastro-esophageal reflux disease without esophagitis: Secondary | ICD-10-CM | POA: Insufficient documentation

## 2016-02-24 DIAGNOSIS — Z79899 Other long term (current) drug therapy: Secondary | ICD-10-CM | POA: Insufficient documentation

## 2016-02-24 HISTORY — PX: EP IMPLANTABLE DEVICE: SHX172B

## 2016-02-24 LAB — BASIC METABOLIC PANEL
Anion gap: 6 (ref 5–15)
BUN: 18 mg/dL (ref 6–20)
CHLORIDE: 108 mmol/L (ref 101–111)
CO2: 27 mmol/L (ref 22–32)
Calcium: 9.2 mg/dL (ref 8.9–10.3)
Creatinine, Ser: 1.27 mg/dL — ABNORMAL HIGH (ref 0.61–1.24)
GFR calc non Af Amer: 48 mL/min — ABNORMAL LOW (ref >60)
GFR, EST AFRICAN AMERICAN: 56 mL/min — AB (ref >60)
Glucose, Bld: 98 mg/dL (ref 65–99)
POTASSIUM: 4 mmol/L (ref 3.5–5.1)
SODIUM: 141 mmol/L (ref 135–145)

## 2016-02-24 LAB — PROTIME-INR
INR: 2.7
PROTHROMBIN TIME: 29.2 s — AB (ref 11.4–15.2)

## 2016-02-24 LAB — SURGICAL PCR SCREEN
MRSA, PCR: NEGATIVE
Staphylococcus aureus: NEGATIVE

## 2016-02-24 SURGERY — PPM/BIV PPM GENERATOR CHANGEOUT

## 2016-02-24 MED ORDER — ONDANSETRON HCL 4 MG/2ML IJ SOLN: 4.0000 mg | Freq: Four times a day (QID) | INTRAMUSCULAR | Status: DC | PRN

## 2016-02-24 MED ORDER — VANCOMYCIN HCL IN DEXTROSE 1-5 GM/200ML-% IV SOLN
INTRAVENOUS | Status: AC
  Filled 2016-02-24: qty 200

## 2016-02-24 MED ORDER — MUPIROCIN 2 % EX OINT
1.0000 "application " | TOPICAL_OINTMENT | Freq: Once | CUTANEOUS | Status: AC
  Administered 2016-02-24: 1 via TOPICAL

## 2016-02-24 MED ORDER — SODIUM CHLORIDE 0.9 % IV SOLN
INTRAVENOUS | Status: DC
  Administered 2016-02-24: 08:00:00 via INTRAVENOUS

## 2016-02-24 MED ORDER — MIDAZOLAM HCL 5 MG/5ML IJ SOLN
INTRAMUSCULAR | Status: DC | PRN
  Administered 2016-02-24: 1 mg via INTRAVENOUS

## 2016-02-24 MED ORDER — MUPIROCIN 2 % EX OINT
TOPICAL_OINTMENT | CUTANEOUS | Status: AC
  Administered 2016-02-24: 1 via TOPICAL
  Filled 2016-02-24: qty 22

## 2016-02-24 MED ORDER — SODIUM CHLORIDE 0.9 % IR SOLN
80.0000 mg | Status: AC
  Administered 2016-02-24: 80 mg

## 2016-02-24 MED ORDER — LIDOCAINE HCL (PF) 1 % IJ SOLN
INTRAMUSCULAR | Status: AC
  Filled 2016-02-24: qty 60

## 2016-02-24 MED ORDER — MIDAZOLAM HCL 5 MG/5ML IJ SOLN
INTRAMUSCULAR | Status: AC
  Filled 2016-02-24: qty 5

## 2016-02-24 MED ORDER — LIDOCAINE HCL (PF) 1 % IJ SOLN
INTRAMUSCULAR | Status: DC | PRN
  Administered 2016-02-24: 34 mL via INTRADERMAL

## 2016-02-24 MED ORDER — ACETAMINOPHEN 325 MG PO TABS
325.0000 mg | ORAL_TABLET | ORAL | Status: DC | PRN
  Filled 2016-02-24: qty 2

## 2016-02-24 MED ORDER — VANCOMYCIN HCL IN DEXTROSE 1-5 GM/200ML-% IV SOLN
1000.0000 mg | INTRAVENOUS | Status: AC
  Administered 2016-02-24: 1000 mg via INTRAVENOUS

## 2016-02-24 MED ORDER — CHLORHEXIDINE GLUCONATE 4 % EX LIQD: 60.0000 mL | Freq: Once | CUTANEOUS | Status: DC

## 2016-02-24 MED ORDER — SODIUM CHLORIDE 0.9 % IV SOLN: INTRAVENOUS | Status: AC

## 2016-02-24 MED ORDER — SODIUM CHLORIDE 0.9 % IR SOLN
Status: AC
  Filled 2016-02-24: qty 2

## 2016-02-24 SURGICAL SUPPLY — 5 items
CABLE SURGICAL S-101-97-12 (CABLE) ×2 IMPLANT
HEMOSTAT SURGICEL 2X4 FIBR (HEMOSTASIS) ×2 IMPLANT
PAD DEFIB LIFELINK (PAD) ×2 IMPLANT
PPM ADVISA MRI DR A2DR01 (Pacemaker) ×2 IMPLANT
TRAY PACEMAKER INSERTION (PACKS) ×2 IMPLANT

## 2016-02-24 NOTE — Discharge Instructions (Signed)
Pacemaker Battery Change, Care After °Refer to this sheet in the next few weeks. These instructions provide you with information on caring for yourself after your procedure. Your health care provider may also give you more specific instructions. Your treatment has been planned according to current medical practices, but problems sometimes occur. Call your health care provider if you have any problems or questions after your procedure. °WHAT TO EXPECT AFTER THE PROCEDURE °After your procedure, it is typical to have the following sensations: °· Soreness at the pacemaker site. °HOME CARE INSTRUCTIONS  °· Keep the incision clean and dry. °· Unless advised otherwise, you may shower beginning 48 hours after your procedure. °· For the first week after the replacement, avoid stretching motions that pull at the incision site, and avoid heavy exercise with the arm that is on the same side as the incision. °· Take medicines only as directed by your health care provider. °· Keep all follow-up visits as directed by your health care provider. °SEEK MEDICAL CARE IF:  °· You have pain at the incision site that is not relieved by over-the-counter or prescription medicine. °· There is drainage or pus from the incision site. °· There is swelling larger than a lime at the incision site. °· You develop red streaking that extends above or below the incision site. °· You feel brief, intermittent palpitations, light-headedness, or any symptoms that you feel might be related to your heart. °SEEK IMMEDIATE MEDICAL CARE IF:  °· You experience chest pain that is different than the pain at the pacemaker site. °· You experience shortness of breath. °· You have palpitations or irregular heartbeat. °· You have light-headedness that does not go away quickly. °· You faint. °· You have pain that gets worse and is not relieved by medicine. °This information is not intended to replace advice given to you by your health care provider. Make sure you  discuss any questions you have with your health care provider. °Document Released: 01/04/2013 Document Revised: 04/06/2014 Document Reviewed: 01/04/2013 °Elsevier Interactive Patient Education © 2017 [employer] ° °

## 2016-02-24 NOTE — Interval H&P Note (Signed)
History and Physical Interval Note:  02/24/2016 11:32 AM  [name] [name]  has presented today for surgery, with the diagnosis of eri  The various methods of treatment have been discussed with the patient and family. After consideration of risks, benefits and other options for treatment, the patient has consented to  Procedure(s): PPM Generator Changeout (N/A) as a surgical intervention .  The patient's history has been reviewed, patient examined, no change in status, stable for surgery.  I have reviewed the patient's chart and labs.  Questions were answered to the patient's satisfaction.     Virl Axe

## 2016-02-24 NOTE — H&P (View-Only) (Signed)
Electrophysiology Office Note   Date:  01/28/2016   ID:  [name] [name], DOB [birth year], MRN [id number]  PCP:  [name] Loffler, MD  Cardiologist:  Primary Electrophysiologist:  [name] Axe, MD    Chief Complaint  Patient presents with  . other    Device check follow up. Patient states hes is doing well. Reviewed medications with patient verbally.     History of Present Illness: [name] [name] is a 82 y.o. male is seen today in followup for  pacemaker implanted for tachybradycardia syndrome in  the context of paroxysmal atrial fibrillation with prior failed ablation. He takes amiodarone. He has had mostly sinus ( 97%) from interrogation 7/17 He has a history of orthostasis in the context of hypertension. No recent problems with dizziness.  Denies chest pain shortness of breath  He has noted no change in functional status since reverting to ERI 10/10  His last TSH in the fall was 8.77. Free T3 and free to 4 word the lower limits of normal.  10/16 TSH 8.77 SGOT 33   Chart review synthroid increased 100>>112 3/17 TSH 4.92 4/17 TSH 1.25 SGOT  7/17 TSH 0.71    .   Patient denies symptoms of GI intolerance, sun sensitivity, neurological symptoms attributable to amiodarone.     Past Medical History:  Diagnosis Date  . Cardiac pacemaker in situ 07/2005   symptomatic bradycardia  . Diverticulosis of colon (without mention of hemorrhage)   . 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 GERD (gastroesophageal reflux disease)   . GI hemorrhage   . Hyperlipidemia   . Hypertension   . Neck arthritis, Severe, multi-level 06/09/2013  . Paroxysmal atrial fibrillation ([name])    s/p failed ablation  . Peptic ulcer, unspecified site, unspecified as acute or chronic, without mention of hemorrhage, perforation, or obstruction 1975  . Personal history of malignant neoplasm of prostate 12/2000   5 weeks radiation, radiation  seeds  . Radiation proctitis   . Sinoatrial node dysfunction ([name])   . Unspecified glaucoma(365.9)   . Unspecified hypothyroidism   . Vitamin B12 deficiency    Past Surgical History:  Procedure Laterality Date  . CARDIAC CATHETERIZATION    . CARDIAC ELECTROPHYSIOLOGY [place] AND ABLATION  2001, 2003   failure  . CATARACT EXTRACTION    . HERNIA REPAIR  1994, 2001, 2003   s/p drainage and complications, 05/6627, 06/7652 mesh removal  . INSERT / REPLACE / REMOVE PACEMAKER  07/2005   symptomatic bradycardia  . NOSE SURGERY     cancer removed   . TOOTH EXTRACTION       Current Outpatient Prescriptions  Medication Sig Dispense Refill  . alendronate (FOSAMAX) 70 MG tablet Take 1 tablet (70 mg total) by mouth every 7 (seven) days. Take with a full glass of water on an empty stomach. 12 tablet 1  . amiodarone (PACERONE) 200 MG tablet Take 0.5 tablets (100 mg total) by mouth daily. 135 tablet 3  . clindamycin (CLEOCIN) 300 MG capsule Take by mouth. 2 tablets by mouth 1 hour prior to dental procedure    . Cyanocobalamin (VITAMIN B-12 IJ) Inject 1,000 mcg/mL as directed every 30 (thirty) days.      . dorzolamide-timolol (COSOPT) 22.3-6.8 MG/ML ophthalmic solution Place 1 drop into both eyes 2 (two) times daily.      . ferrous sulfate 325 (65 FE) MG EC tablet Take 325 mg by mouth  daily with breakfast.      . fluocinonide ointment (LIDEX) 6.01 % Apply 1 application topically 2 (two) times daily. (Patient taking differently: Apply 1 application topically as needed. ) 60 g 1  . levothyroxine (SYNTHROID, LEVOTHROID) 112 MCG tablet Take 1 tablet (112 mcg total) by mouth daily. 90 tablet 3  . Multiple Vitamin (MULTIVITAMIN) tablet Take 1 tablet by mouth daily.      . pantoprazole (PROTONIX) 40 MG tablet Take 1 tablet (40 mg total) by mouth daily. 90 tablet 3  . pravastatin (PRAVACHOL) 40 MG tablet Take 0.5 tablets (20 mg total) by mouth daily. 90 tablet 3  . torsemide (DEMADEX) 20 MG tablet Take 1  tablet (20 mg total) by mouth daily as needed. 90 tablet 1  . warfarin (COUMADIN) 4 MG tablet Take as directed by coumadin clinic 90 tablet 1  . Wheat Dextrin (BENEFIBER DRINK MIX PO) Take by mouth as directed.       No current facility-administered medications for this visit.     Allergies:   Penicillins   Social History:  The patient  reports that he has never smoked. He has never used smokeless tobacco. He reports that he does not drink alcohol or use drugs.   Family History:  The patient's    family history includes Bone cancer in his father; Breast cancer in his mother.    ROS:  Please see the history of present illness and past medical histor .   All other systems are reviewed and negative.    PHYSICAL EXAM: VS:  BP 134/76 (BP Location: Left Arm, Patient Position: Sitting, Cuff Size: Normal)   Pulse 65   Ht 5\' 10"  (1.778 m)   Wt 150 lb 8 oz (68.3 kg)   BMI 21.59 kg/m  , BMI Body mass index is 21.59 kg/m. GEN: Well nourished, well developed, in no acute distress  HEENT: normal  Neck:  JVD flat , carotid bruits, or masses Cardiac: REGULAR RATE and RHYTHM ; 2/6 murmurs, rubs, No S4  Back without kyphosis or CVAT Respiratory:  clear to auscultation bilaterally, normal work of breathing GI: soft, nontender, nondistended, + BS MS: no deformity or atrophy  Extremities no clubbing cyanosis  edema Skin: warm and dry,  device pocket is well healed without teathering Neuro:  Strength and sensation are intact Psych: euthymic mood, full affect   ECG demonstrates atrial pacing at 72  Intervals 24/09/41    Device interrogation is reviewed today in detail.  See PaceArt for details.      Lipid Panel     Component Value Date/Time   CHOL 133 12/25/2015 0833   TRIG 60.0 12/25/2015 0833   HDL 63.70 12/25/2015 0833   CHOLHDL 2 12/25/2015 0833   VLDL 12.0 12/25/2015 0833   LDLCALC 57 12/25/2015 0833     Wt Readings from Last 3 Encounters:  01/28/16 150 lb 8 oz (68.3 kg)    12/30/15 142 lb 12 oz (64.8 kg)  10/08/15 145 lb 4 oz (65.9 kg)      Other studies Reviewed: As above    ASSESSMENT AND PLAN:  Atrial fibrillation  = paroxysmal  Sinus node dysfunction   Pacemaker  Medtronic  The patient's device was interrogated.  The information was reviewed. His device has reached ERI. It was reprogrammed back to dual-chamber pacing mode  Hypothyroidism-- most recently euthyroid however decreasing TSH  Amiodarone therapy   His device has reached ERI.  We have reviewed the benefits and risks of generator  replacement.  These include but are not limited to lead fracture and infection.  The patient understands, agrees and is willing to proceed.    His recent delta TSH remains quite steep. We will decrease his Synthroid from 112--100.  We will check transaminases with his preoperative laboratories;  Tolerating amio  No signif Afib  No bleeding issues        Orders Placed This Encounter  Procedures  . EKG 12-Lead       Signed, [name] Axe, MD  01/28/2016 9:44 AM     [address] [address] [place] [address] [phone] (office) [phone] (fax)

## 2016-02-25 ENCOUNTER — Encounter (HOSPITAL_COMMUNITY): Payer: Self-pay | Admitting: Internal Medicine

## 2016-02-25 ENCOUNTER — Ambulatory Visit (INDEPENDENT_AMBULATORY_CARE_PROVIDER_SITE_OTHER): Payer: Medicare Other | Admitting: *Deleted

## 2016-02-25 DIAGNOSIS — E538 Deficiency of other specified B group vitamins: Secondary | ICD-10-CM | POA: Diagnosis not present

## 2016-02-25 DIAGNOSIS — R2681 Unsteadiness on feet: Secondary | ICD-10-CM | POA: Diagnosis not present

## 2016-02-25 MED ORDER — CYANOCOBALAMIN 1000 MCG/ML IJ SOLN
1000.0000 ug | Freq: Once | INTRAMUSCULAR | Status: AC
  Administered 2016-02-25: 1000 ug via INTRAMUSCULAR

## 2016-02-25 MED FILL — Vancomycin HCl-Dextrose IV Soln 1 GM/200ML-5%: INTRAVENOUS | Qty: 200 | Status: AC

## 2016-02-25 MED FILL — Sodium Chloride Irrigation Soln 0.9%: Qty: 500 | Status: AC

## 2016-02-25 MED FILL — Gentamicin Sulfate Inj 40 MG/ML: INTRAMUSCULAR | Qty: 2 | Status: AC

## 2016-02-27 ENCOUNTER — Encounter: Payer: Self-pay | Admitting: Internal Medicine

## 2016-02-28 DIAGNOSIS — R2681 Unsteadiness on feet: Secondary | ICD-10-CM | POA: Diagnosis not present

## 2016-03-04 ENCOUNTER — Ambulatory Visit (INDEPENDENT_AMBULATORY_CARE_PROVIDER_SITE_OTHER): Payer: Medicare Other

## 2016-03-04 DIAGNOSIS — I4891 Unspecified atrial fibrillation: Secondary | ICD-10-CM | POA: Diagnosis not present

## 2016-03-04 DIAGNOSIS — Z7901 Long term (current) use of anticoagulants: Secondary | ICD-10-CM

## 2016-03-04 LAB — POCT INR: INR: 2.9

## 2016-03-06 DIAGNOSIS — R2681 Unsteadiness on feet: Secondary | ICD-10-CM | POA: Diagnosis not present

## 2016-03-09 DIAGNOSIS — R2681 Unsteadiness on feet: Secondary | ICD-10-CM | POA: Diagnosis not present

## 2016-03-10 ENCOUNTER — Ambulatory Visit (INDEPENDENT_AMBULATORY_CARE_PROVIDER_SITE_OTHER): Payer: Medicare Other | Admitting: *Deleted

## 2016-03-10 DIAGNOSIS — H401132 Primary open-angle glaucoma, bilateral, moderate stage: Secondary | ICD-10-CM | POA: Diagnosis not present

## 2016-03-10 DIAGNOSIS — I495 Sick sinus syndrome: Secondary | ICD-10-CM

## 2016-03-10 DIAGNOSIS — Z950 Presence of cardiac pacemaker: Secondary | ICD-10-CM | POA: Diagnosis not present

## 2016-03-10 LAB — CUP PACEART INCLINIC DEVICE CHECK
Battery Remaining Longevity: 130 mo
Brady Statistic AP VS Percent: 89.32 %
Brady Statistic AS VS Percent: 9.42 %
Implantable Lead Implant Date: 20070525
Implantable Lead Location: 753859
Implantable Lead Model: 5076
Lead Channel Impedance Value: 399 Ohm
Lead Channel Impedance Value: 418 Ohm
Lead Channel Pacing Threshold Amplitude: 0.75 V
Lead Channel Sensing Intrinsic Amplitude: 2.625 mV
Lead Channel Setting Pacing Amplitude: 2 V
Lead Channel Setting Pacing Amplitude: 2.5 V
Lead Channel Setting Pacing Pulse Width: 0.4 ms
MDC IDC LEAD IMPLANT DT: 20070525
MDC IDC LEAD LOCATION: 753860
MDC IDC MSMT BATTERY VOLTAGE: 3.12 V
MDC IDC MSMT LEADCHNL RA PACING THRESHOLD AMPLITUDE: 0.75 V
MDC IDC MSMT LEADCHNL RA PACING THRESHOLD PULSEWIDTH: 0.4 ms
MDC IDC MSMT LEADCHNL RA SENSING INTR AMPL: 2.375 mV
MDC IDC MSMT LEADCHNL RV IMPEDANCE VALUE: 361 Ohm
MDC IDC MSMT LEADCHNL RV IMPEDANCE VALUE: 437 Ohm
MDC IDC MSMT LEADCHNL RV PACING THRESHOLD PULSEWIDTH: 0.4 ms
MDC IDC PG IMPLANT DT: 20171127
MDC IDC SESS DTM: 20171212145027
MDC IDC SET LEADCHNL RV SENSING SENSITIVITY: 0.6 mV
MDC IDC STAT BRADY AP VP PERCENT: 0.33 %
MDC IDC STAT BRADY AS VP PERCENT: 0.94 %
MDC IDC STAT BRADY RA PERCENT PACED: 87.59 %
MDC IDC STAT BRADY RV PERCENT PACED: 1.27 %

## 2016-03-10 NOTE — Progress Notes (Signed)
Wound check appointment. Dermabond removed. Wound without redness or edema. Pinhole opening noted along medial incision, no stitch noted, antibiotic ointment and bandaid applied. Pt to replace ointment and bandaid tomorrow after bathing, then leave open to air. Pt agrees to call [facility] if he notes stitch or signs/symptoms of infection. Incision edges approximated, wound well healed. Normal device function. Thresholds, sensing, and impedances consistent with implant measurements. Device reprogrammed to chronic outputs--RA at 2.0V, RV at 2.5V, acute phase completed. Histogram distribution appropriate for patient and level of activity. 105 mode switches (7.2% burden), AF +amiodarone and warfarin. No high ventricular rates noted. Patient educated about wound care, arm mobility, lifting restrictions. ROV with SK/B on 06/02/16.

## 2016-03-11 ENCOUNTER — Telehealth: Payer: Self-pay | Admitting: Family Medicine

## 2016-03-11 ENCOUNTER — Other Ambulatory Visit (INDEPENDENT_AMBULATORY_CARE_PROVIDER_SITE_OTHER): Payer: Medicare Other

## 2016-03-11 DIAGNOSIS — D508 Other iron deficiency anemias: Secondary | ICD-10-CM | POA: Diagnosis not present

## 2016-03-11 DIAGNOSIS — Z79899 Other long term (current) drug therapy: Secondary | ICD-10-CM

## 2016-03-11 DIAGNOSIS — E538 Deficiency of other specified B group vitamins: Secondary | ICD-10-CM | POA: Diagnosis not present

## 2016-03-11 DIAGNOSIS — E559 Vitamin D deficiency, unspecified: Secondary | ICD-10-CM

## 2016-03-11 LAB — BASIC METABOLIC PANEL
BUN: 27 mg/dL — ABNORMAL HIGH (ref 6–23)
CALCIUM: 9.1 mg/dL (ref 8.4–10.5)
CO2: 29 mEq/L (ref 19–32)
CREATININE: 1.23 mg/dL (ref 0.40–1.50)
Chloride: 108 mEq/L (ref 96–112)
GFR: 58.63 mL/min — AB (ref >60.00)
Glucose, Bld: 109 mg/dL — ABNORMAL HIGH (ref 70–99)
Potassium: 4.4 mEq/L (ref 3.5–5.1)
SODIUM: 142 meq/L (ref 135–145)

## 2016-03-11 LAB — CBC WITH DIFFERENTIAL/PLATELET
BASOS ABS: 0 10*3/uL (ref 0.0–0.1)
Basophils Relative: 0.5 % (ref 0.0–3.0)
EOS ABS: 0.2 10*3/uL (ref 0.0–0.7)
Eosinophils Relative: 3.4 % (ref 0.0–5.0)
HEMATOCRIT: 37.3 % — AB (ref 39.0–52.0)
HEMOGLOBIN: 12.4 g/dL — AB (ref 13.0–17.0)
LYMPHS PCT: 24.6 % (ref 12.0–46.0)
Lymphs Abs: 1.3 10*3/uL (ref 0.7–4.0)
MCHC: 33.1 g/dL (ref 30.0–36.0)
MCV: 98.5 fl (ref 78.0–100.0)
Monocytes Absolute: 0.5 10*3/uL (ref 0.1–1.0)
Monocytes Relative: 8.4 % (ref 3.0–12.0)
NEUTROS ABS: 3.4 10*3/uL (ref 1.4–7.7)
Neutrophils Relative %: 63.1 % (ref 43.0–77.0)
PLATELETS: 195 10*3/uL (ref 150.0–400.0)
RBC: 3.79 Mil/uL — AB (ref 4.22–5.81)
RDW: 13.7 % (ref 11.5–15.5)
WBC: 5.4 10*3/uL (ref 4.0–10.5)

## 2016-03-11 LAB — VITAMIN B12: VITAMIN B 12: 400 pg/mL (ref 211–911)

## 2016-03-11 LAB — HEPATIC FUNCTION PANEL
ALBUMIN: 3.9 g/dL (ref 3.5–5.2)
ALK PHOS: 115 U/L (ref 39–117)
ALT: 28 U/L (ref 0–53)
AST: 35 U/L (ref 0–37)
Bilirubin, Direct: 0.2 mg/dL (ref 0.0–0.3)
TOTAL PROTEIN: 6.4 g/dL (ref 6.0–8.3)
Total Bilirubin: 0.8 mg/dL (ref 0.2–1.2)

## 2016-03-11 LAB — FERRITIN: FERRITIN: 142.8 ng/mL (ref 22.0–322.0)

## 2016-03-11 LAB — IBC PANEL
IRON: 116 ug/dL (ref 42–165)
SATURATION RATIOS: 35.3 % (ref 20.0–50.0)
Transferrin: 235 mg/dL (ref 212.0–360.0)

## 2016-03-11 LAB — VITAMIN D 25 HYDROXY (VIT D DEFICIENCY, FRACTURES): VITD: 33.17 ng/mL (ref 30.00–100.00)

## 2016-03-11 MED ORDER — ALENDRONATE SODIUM 70 MG PO TABS: 70.0000 mg | ORAL_TABLET | ORAL | 3 refills | Status: DC

## 2016-03-11 NOTE — Telephone Encounter (Signed)
Pt me in today for labs and needed to get a refill on alendronate tab 70mg  Take 1 by mouth every 7days with a full glass of water on empty stomach  Needs to be sent to [facility]

## 2016-03-13 DIAGNOSIS — R2681 Unsteadiness on feet: Secondary | ICD-10-CM | POA: Diagnosis not present

## 2016-03-16 DIAGNOSIS — R2681 Unsteadiness on feet: Secondary | ICD-10-CM | POA: Diagnosis not present

## 2016-03-17 DIAGNOSIS — H401132 Primary open-angle glaucoma, bilateral, moderate stage: Secondary | ICD-10-CM | POA: Diagnosis not present

## 2016-03-18 ENCOUNTER — Ambulatory Visit (INDEPENDENT_AMBULATORY_CARE_PROVIDER_SITE_OTHER): Payer: Medicare Other | Admitting: Family Medicine

## 2016-03-18 ENCOUNTER — Encounter: Payer: Self-pay | Admitting: Family Medicine

## 2016-03-18 VITALS — BP 130/68 | HR 86 | Temp 97.6°F | Ht 68.75 in | Wt 149.8 lb

## 2016-03-18 DIAGNOSIS — Z0000 Encounter for general adult medical examination without abnormal findings: Secondary | ICD-10-CM

## 2016-03-18 MED ORDER — PRAVASTATIN SODIUM 40 MG PO TABS: 20.0000 mg | ORAL_TABLET | Freq: Every day | ORAL | 3 refills | Status: DC

## 2016-03-18 NOTE — Progress Notes (Signed)
Pre visit review using our clinic review tool, if applicable. No additional management support is needed unless otherwise documented below in the visit note. 

## 2016-03-18 NOTE — Progress Notes (Signed)
Dr. Frederico Hamman T. Vanita Cannell, MD, [facility] [place], [address] Phone: [phone] Fax: [phone]  03/18/2016  Patient: [name] [name], MRN: [id number], DOB: Mar 01, 1923, 82 y.o.  Primary Physician:  Owens Loffler, MD   Chief Complaint  Patient presents with  . Medicare Wellness   Subjective:   [name] [name] is a 82 y.o. pleasant patient who presents for a medicare wellness examination:  Preventative Health Maintenance Visit:  Health Maintenance Summary Reviewed and updated, unless pt declines services.  PACEMAKER JUST CHANGED OUT.   Tobacco History Reviewed. Alcohol: No concerns, no excessive use Exercise Habits: WALLKS SOME, rec at least 30 mins 5 times a week STD concerns: no risk or activity to increase risk Drug Use: None Encouraged self-testicular check  Health Maintenance  Topic Date Due  . TETANUS/TDAP  03/30/2013  . INFLUENZA VACCINE  06/27/2016 (Originally 10/29/2015)  . ZOSTAVAX  Completed  . PNA vac Low Risk Adult  Completed    Immunization History  Administered Date(s) Administered  . H1N1 05/09/2008  . Influenza Split 02/10/2011  . Influenza Whole 01/15/2009, 01/13/2010  . Influenza, High Dose Seasonal PF 12/21/2012  . Influenza,inj,Quad PF,36+ Mos 01/08/2014, 01/25/2015  . Pneumococcal Conjugate-13 12/14/2013  . Pneumococcal Polysaccharide-23 03/31/2003, 03/30/2005  . Td 03/31/2003  . Zoster 02/06/2008    Patient Active Problem List   Diagnosis Date Noted  . PACEMAKER, PERMANENT 08/07/2008    Priority: Medium  . PAROXYSMAL ATRIAL FIBRILLATION 02/06/2008    Priority: Medium  . DDD (degenerative disc disease), cervical, Severe 06/09/2013  . Neck arthritis, Severe, multi-level 06/09/2013  . Sinoatrial node dysfunction ([name])   . Long term (current) use of anticoagulants 06/24/2010  . IRON DEFICIENCY 04/17/2010  . GLAUCOMA 08/07/2008  . GERD 08/07/2008  . Vitamin B 12  deficiency 07/19/2008  . PEPTIC ULCER DISEASE 07/18/2008  . RADIATION PROCTITIS 07/18/2008  . DIVERTICULOSIS, COLON 07/18/2008  . Prostate cancer, in remission 07/18/2008  . Essential hypertension 05/09/2008  . Hyperlipidemia 02/06/2008  . Hypothyroidism 12/01/2007  . HERN UNS SITE ABD CAV W/O MENTION OBST/GANGREN 12/01/2007   Past Medical History:  Diagnosis Date  . Cardiac pacemaker in situ 07/2005   symptomatic bradycardia  . Diverticulosis of colon (without mention of hemorrhage)   . 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 GERD (gastroesophageal reflux disease)   . GI hemorrhage   . Hyperlipidemia   . Hypertension   . Neck arthritis, Severe, multi-level 06/09/2013  . Paroxysmal atrial fibrillation ([name])    s/p failed ablation  . Peptic ulcer, unspecified site, unspecified as acute or chronic, without mention of hemorrhage, perforation, or obstruction 1975  . Personal history of malignant neoplasm of prostate 12/2000   5 weeks radiation, radiation seeds  . Radiation proctitis   . Sinoatrial node dysfunction ([name])   . Unspecified glaucoma(365.9)   . Unspecified hypothyroidism   . Vitamin B12 deficiency    Past Surgical History:  Procedure Laterality Date  . CARDIAC CATHETERIZATION    . CARDIAC ELECTROPHYSIOLOGY [place] AND ABLATION  2001, 2003   failure  . CATARACT EXTRACTION    . EP IMPLANTABLE DEVICE N/A 02/24/2016   Procedure: PPM Generator Changeout;  Surgeon: Deboraha Sprang, MD;  Location: [facility];  Service: Cardiovascular;  Laterality: N/A;  . [place], 2001, 2003   s/p drainage and complications, 04/3298, 09/6224 mesh removal  .  INSERT / REPLACE / REMOVE PACEMAKER  07/2005   symptomatic bradycardia  . NOSE SURGERY     cancer removed   . TOOTH EXTRACTION     Social History   Social History  . Marital status: Widowed    Spouse name: N/A  . Number of children: N/A  . Years  of education: N/A   Occupational History  . Not on file.   Social History Main Topics  . Smoking status: Never Smoker  . Smokeless tobacco: Never Used  . Alcohol use No  . Drug use: No  . Sexual activity: Not on file   Other Topics Concern  . Not on file   Social History Narrative  . No narrative on file   Family History  Problem Relation Age of Onset  . Breast cancer Mother   . Bone cancer Father   . Alcohol abuse    . Coronary artery disease    . Dementia    . Diabetes     Allergies  Allergen Reactions  . Penicillins Rash    Has patient had a PCN reaction causing immediate rash, facial/tongue/throat swelling, SOB or lightheadedness with hypotension: {no Has patient had a PCN reaction causing severe rash involving mucus membranes or skin necrosis: {no Has patient had a PCN reaction that required hospitalization {no Has patient had a PCN reaction occurring within the last 10 years: {no If all of the above answers are "NO", then may proceed with Cephalosporin use.    Medication list has been reviewed and updated.   General: Denies fever, chills, sweats. No significant weight loss. Eyes: Denies blurring,significant itching ENT: Denies earache, sore throat, and hoarseness. Cardiovascular: JUST HAD PACEMAKER CHANGED OUT. Respiratory: Denies cough, dyspnea at rest,wheeezing Breast: no concerns about lumps GI: Denies nausea, vomiting, diarrhea, constipation, change in bowel habits, abdominal pain, melena, hematochezia GU: Denies penile discharge, ED, urinary flow / outflow problems. No STD concerns. Musculoskeletal: Denies back pain, joint pain Derm: Denies rash, itching Neuro: Denies  paresthesias, frequent falls, frequent headaches Psych: Denies depression, anxiety Endocrine: Denies cold intolerance, heat intolerance, polydipsia Heme: Denies enlarged lymph nodes Allergy: No hayfever  Objective:   BP 130/68   Pulse 86   Temp 97.6 F (36.4 C) (Oral)   Ht 5'  8.75" (1.746 m)   Wt 149 lb 12 oz (67.9 kg)   BMI 22.28 kg/m   The patient completed a fall screen and PHQ-2 and PHQ-9 if necessary, which is documented in the EHR. The CMA/LPN/RN who assisted the patient verbally completed with them and documented results in [place].   Hearing Screening   Method: Audiometry   _0  _1  _2  _3  _4  _5  _6  _7  _8   Right ear:   40 40 0  0    Left ear:   40 0 0  0    Vision Screening Comments: Wears Glasses - Eye Exam with Dr. Kerman Passey [facility] 03/17/2016  GEN: well developed, well nourished, no acute distress Eyes: conjunctiva and lids normal, PERRLA, EOMI ENT: TM clear, nares clear, oral exam WNL Neck: supple, no lymphadenopathy, no thyromegaly, no JVD Pulm: clear to auscultation and percussion, respiratory effort normal CV: regular rate and rhythm, S1-S2, no murmur, rub or gallop, no bruits, peripheral pulses normal and symmetric, no cyanosis, clubbing, edema or varicosities GI: soft, non-tender; no hepatosplenomegaly, masses; active bowel sounds all quadrants GU: no hernia, testicular mass, penile discharge Lymph: no cervical, axillary or inguinal adenopathy MSK: gait normal, muscle tone  and strength WNL, no joint swelling, effusions, discoloration, crepitus  SKIN: clear, good turgor, color WNL, no rashes, lesions, or ulcerations Neuro: normal mental status, normal strength, sensation, and motion Psych: alert; oriented to person, place and time, normally interactive and not anxious or depressed in appearance.  All labs reviewed with patient.  Lipids:    Component Value Date/Time   CHOL 133 12/25/2015 0833   TRIG 60.0 12/25/2015 0833   HDL 63.70 12/25/2015 0833   VLDL 12.0 12/25/2015 0833   CHOLHDL 2 12/25/2015 0833   CBC: CBC Latest Ref Rng & Units 03/11/2016 02/17/2016 12/25/2015  WBC 4.0 - 10.5 K/uL 5.4 5.9 5.2  Hemoglobin 13.0 - 17.0 g/dL 12.4(L) 12.8(L) 12.2(L)  Hematocrit 39.0 - 52.0  % 37.3(L) 37.9(L) 36.4(L)  Platelets 150.0 - 400.0 K/uL 195.0 201 250.0    Basic Metabolic Panel:    Component Value Date/Time   NA 142 03/11/2016 1448   K 4.4 03/11/2016 1448   CL 108 03/11/2016 1448   CO2 29 03/11/2016 1448   BUN 27 (H) 03/11/2016 1448   CREATININE 1.23 03/11/2016 1448   GLUCOSE 109 (H) 03/11/2016 1448   CALCIUM 9.1 03/11/2016 1448   Hepatic Function Latest Ref Rng & Units 03/11/2016 12/25/2015 10/08/2015  Total Protein 6.0 - 8.3 g/dL 6.4 6.5 6.0  Albumin 3.5 - 5.2 g/dL 3.9 3.5 3.6  AST 0 - 37 U/L 35 26 25  ALT 0 - 53 U/L _0 Alk Phosphatase 39 - 117 U/L 115 97 110  Total Bilirubin 0.2 - 1.2 mg/dL 0.8 0.8 0.7  Bilirubin, Direct 0.0 - 0.3 mg/dL 0.2 0.1 0.24    Lab Results  Component Value Date   TSH 0.709 10/08/2015   No results found for: PSA  Assessment and Plan:   Healthcare maintenance  Doing great overall at 89.  Health Maintenance Exam: The patient's preventative maintenance and recommended screening tests for an annual wellness exam were reviewed in full today. Brought up to date unless services declined.  Counselled on the importance of diet, exercise, and its role in overall health and mortality. The patient's FH and SH was reviewed, including their home life, tobacco status, and drug and alcohol status.  Follow-up in 1 year for physical exam or additional follow-up below.  I have personally reviewed the Medicare Annual Wellness questionnaire and have noted 1. The patient's medical and social history 2. Their use of alcohol, tobacco or illicit drugs 3. Their current medications and supplements 4. The patient's functional ability including ADL's, fall risks, home safety risks and hearing or visual             impairment. 5. Diet and physical activities 6. Evidence for depression or mood disorders 7. Reviewed Updated provider list, see scanned forms and [facility] Snapshot.   The patients weight, height, BMI and visual acuity have been  recorded in the chart I have made referrals, counseling and provided education to the patient based review of the above and I have provided the pt with a written personalized care plan for preventive services.  I have provided the patient with a copy of your personalized plan for preventive services. Instructed to take the time to review along with their updated medication list.  Follow-up: No Follow-up on file. Or follow-up in 1 year if not noted.  Meds ordered this encounter  Medications  . pravastatin (PRAVACHOL) 40 MG tablet    Sig: Take 0.5 tablets (20 mg total) by mouth daily.    Dispense:  90 tablet    Refill:  3   Medications Discontinued During This Encounter  Medication Reason  . pravastatin (PRAVACHOL) 40 MG tablet Reorder   No orders of the defined types were placed in this encounter.   Signed,  [name]. Jerelle Virden, MD   Allergies as of 03/18/2016      Reactions   Penicillins Rash   Has patient had a PCN reaction causing immediate rash, facial/tongue/throat swelling, SOB or lightheadedness with hypotension: {no Has patient had a PCN reaction causing severe rash involving mucus membranes or skin necrosis: {no Has patient had a PCN reaction that required hospitalization {no Has patient had a PCN reaction occurring within the last 10 years: {no If all of the above answers are "NO", then may proceed with Cephalosporin use.      Medication List       Accurate as of 03/18/16 11:06 AM. Always use your most recent med list.          alendronate 70 MG tablet Commonly known as:  FOSAMAX Take 1 tablet (70 mg total) by mouth every 7 (seven) days. Take with a full glass of water on an empty stomach.   amiodarone 200 MG tablet Commonly known as:  PACERONE Take 0.5 tablets (100 mg total) by mouth daily.   BENEFIBER DRINK MIX PO Take by mouth as directed.   clindamycin 300 MG capsule Commonly known as:  CLEOCIN Take 600 mg by mouth daily as needed. 2 tablets by  mouth 1 hour prior to dental procedure   dorzolamide-timolol 22.3-6.8 MG/ML ophthalmic solution Commonly known as:  COSOPT Place 1 drop into both eyes 2 (two) times daily.   ferrous sulfate 325 (65 FE) MG EC tablet Take 325 mg by mouth daily with breakfast.   fluocinonide ointment 0.05 % Commonly known as:  LIDEX Apply 1 application topically 2 (two) times daily.   levothyroxine 100 MCG tablet Commonly known as:  SYNTHROID, LEVOTHROID Take 1 tablet (100 mcg total) by mouth daily before breakfast.   multivitamin tablet Take 1 tablet by mouth daily.   pantoprazole 40 MG tablet Commonly known as:  PROTONIX Take 1 tablet (40 mg total) by mouth daily.   pravastatin 40 MG tablet Commonly known as:  PRAVACHOL Take 0.5 tablets (20 mg total) by mouth daily.   torsemide 20 MG tablet Commonly known as:  DEMADEX Take 1 tablet (20 mg total) by mouth daily as needed.   VITAMIN B-12 IJ Inject 1,000 mcg/mL as directed every 30 (thirty) days.   warfarin 4 MG tablet Commonly known as:  COUMADIN Take as directed by coumadin clinic

## 2016-03-20 DIAGNOSIS — R2681 Unsteadiness on feet: Secondary | ICD-10-CM | POA: Diagnosis not present

## 2016-03-27 DIAGNOSIS — R2681 Unsteadiness on feet: Secondary | ICD-10-CM | POA: Diagnosis not present

## 2016-03-31 DIAGNOSIS — R2681 Unsteadiness on feet: Secondary | ICD-10-CM | POA: Diagnosis not present

## 2016-04-01 ENCOUNTER — Ambulatory Visit (INDEPENDENT_AMBULATORY_CARE_PROVIDER_SITE_OTHER): Payer: Medicare Other

## 2016-04-01 DIAGNOSIS — E538 Deficiency of other specified B group vitamins: Secondary | ICD-10-CM | POA: Diagnosis not present

## 2016-04-01 DIAGNOSIS — I4891 Unspecified atrial fibrillation: Secondary | ICD-10-CM | POA: Diagnosis not present

## 2016-04-01 DIAGNOSIS — Z7901 Long term (current) use of anticoagulants: Secondary | ICD-10-CM

## 2016-04-01 LAB — POCT INR: INR: 2.2

## 2016-04-01 MED ORDER — CYANOCOBALAMIN 1000 MCG/ML IJ SOLN
1000.0000 ug | Freq: Once | INTRAMUSCULAR | Status: AC
  Administered 2016-04-01: 1000 ug via INTRAMUSCULAR

## 2016-04-03 DIAGNOSIS — R2681 Unsteadiness on feet: Secondary | ICD-10-CM | POA: Diagnosis not present

## 2016-04-07 DIAGNOSIS — R2681 Unsteadiness on feet: Secondary | ICD-10-CM | POA: Diagnosis not present

## 2016-04-09 ENCOUNTER — Ambulatory Visit: Payer: Medicare Other | Admitting: *Deleted

## 2016-04-09 DIAGNOSIS — Z950 Presence of cardiac pacemaker: Secondary | ICD-10-CM

## 2016-04-09 NOTE — Patient Instructions (Signed)
Leave bandaid on for 24 hours (noon on Friday 1/12).  Change bandaid and re-apply neosporin daily for three days (Friday, Saturday and Sunday)  Call [phone] if area does not heal or if you notice signs and symptoms of infection - increased redness, swelling or drainage.

## 2016-04-10 DIAGNOSIS — R2681 Unsteadiness on feet: Secondary | ICD-10-CM | POA: Diagnosis not present

## 2016-04-10 NOTE — Progress Notes (Signed)
Patient c/o scant bloody drainage on his undershirt when he changes in the evening. Upon assessment, incision was approximated with a stitch noted on the left medial portion of the incision. Stitch clipped, antibiotic ointment applied with bandaid. Patient instructed to leave bandaid on for 24hr and then change daily with re-application of antibiotic ointment for 3 days. Patient verbalized understanding and will call device clinic if he notices any s/s of infection or that the area is not healing. Device clinic number given to patient.

## 2016-04-13 DIAGNOSIS — R2681 Unsteadiness on feet: Secondary | ICD-10-CM | POA: Diagnosis not present

## 2016-04-17 DIAGNOSIS — R2681 Unsteadiness on feet: Secondary | ICD-10-CM | POA: Diagnosis not present

## 2016-04-21 DIAGNOSIS — R2681 Unsteadiness on feet: Secondary | ICD-10-CM | POA: Diagnosis not present

## 2016-04-24 DIAGNOSIS — R2681 Unsteadiness on feet: Secondary | ICD-10-CM | POA: Diagnosis not present

## 2016-04-27 DIAGNOSIS — R2681 Unsteadiness on feet: Secondary | ICD-10-CM | POA: Diagnosis not present

## 2016-05-01 DIAGNOSIS — R2681 Unsteadiness on feet: Secondary | ICD-10-CM | POA: Diagnosis not present

## 2016-05-05 ENCOUNTER — Ambulatory Visit (INDEPENDENT_AMBULATORY_CARE_PROVIDER_SITE_OTHER): Payer: Medicare Other

## 2016-05-05 DIAGNOSIS — E538 Deficiency of other specified B group vitamins: Secondary | ICD-10-CM

## 2016-05-05 MED ORDER — CYANOCOBALAMIN 1000 MCG/ML IJ SOLN
1000.0000 ug | Freq: Once | INTRAMUSCULAR | Status: AC
  Administered 2016-05-05: 1000 ug via INTRAMUSCULAR

## 2016-05-06 DIAGNOSIS — R2681 Unsteadiness on feet: Secondary | ICD-10-CM | POA: Diagnosis not present

## 2016-05-08 DIAGNOSIS — R2681 Unsteadiness on feet: Secondary | ICD-10-CM | POA: Diagnosis not present

## 2016-05-11 DIAGNOSIS — R2681 Unsteadiness on feet: Secondary | ICD-10-CM | POA: Diagnosis not present

## 2016-05-13 ENCOUNTER — Ambulatory Visit (INDEPENDENT_AMBULATORY_CARE_PROVIDER_SITE_OTHER): Payer: Medicare Other

## 2016-05-13 DIAGNOSIS — I4891 Unspecified atrial fibrillation: Secondary | ICD-10-CM | POA: Diagnosis not present

## 2016-05-13 DIAGNOSIS — Z7901 Long term (current) use of anticoagulants: Secondary | ICD-10-CM | POA: Diagnosis not present

## 2016-05-13 LAB — POCT INR: INR: 1.7

## 2016-05-15 DIAGNOSIS — R2681 Unsteadiness on feet: Secondary | ICD-10-CM | POA: Diagnosis not present

## 2016-05-18 DIAGNOSIS — R2681 Unsteadiness on feet: Secondary | ICD-10-CM | POA: Diagnosis not present

## 2016-05-22 DIAGNOSIS — R2681 Unsteadiness on feet: Secondary | ICD-10-CM | POA: Diagnosis not present

## 2016-05-25 DIAGNOSIS — R2681 Unsteadiness on feet: Secondary | ICD-10-CM | POA: Diagnosis not present

## 2016-05-29 DIAGNOSIS — R2681 Unsteadiness on feet: Secondary | ICD-10-CM | POA: Diagnosis not present

## 2016-06-02 ENCOUNTER — Ambulatory Visit (INDEPENDENT_AMBULATORY_CARE_PROVIDER_SITE_OTHER): Payer: Medicare Other

## 2016-06-02 ENCOUNTER — Encounter: Payer: Medicare Other | Admitting: Internal Medicine

## 2016-06-02 DIAGNOSIS — I4891 Unspecified atrial fibrillation: Secondary | ICD-10-CM | POA: Diagnosis not present

## 2016-06-02 DIAGNOSIS — Z7901 Long term (current) use of anticoagulants: Secondary | ICD-10-CM | POA: Diagnosis not present

## 2016-06-02 LAB — POCT INR: INR: 2.5

## 2016-06-04 DIAGNOSIS — R2681 Unsteadiness on feet: Secondary | ICD-10-CM | POA: Diagnosis not present

## 2016-06-10 ENCOUNTER — Ambulatory Visit (INDEPENDENT_AMBULATORY_CARE_PROVIDER_SITE_OTHER): Payer: Medicare Other

## 2016-06-10 DIAGNOSIS — E538 Deficiency of other specified B group vitamins: Secondary | ICD-10-CM

## 2016-06-10 MED ORDER — CYANOCOBALAMIN 1000 MCG/ML IJ SOLN
1000.0000 ug | Freq: Once | INTRAMUSCULAR | Status: AC
Start: 2016-06-10 — End: 2016-06-10
  Administered 2016-06-10: 1000 ug via INTRAMUSCULAR

## 2016-06-11 DIAGNOSIS — R2681 Unsteadiness on feet: Secondary | ICD-10-CM | POA: Diagnosis not present

## 2016-06-24 ENCOUNTER — Ambulatory Visit (INDEPENDENT_AMBULATORY_CARE_PROVIDER_SITE_OTHER): Payer: Medicare Other

## 2016-06-24 DIAGNOSIS — Z7901 Long term (current) use of anticoagulants: Secondary | ICD-10-CM | POA: Diagnosis not present

## 2016-06-24 DIAGNOSIS — I4891 Unspecified atrial fibrillation: Secondary | ICD-10-CM | POA: Diagnosis not present

## 2016-06-24 DIAGNOSIS — I480 Paroxysmal atrial fibrillation: Secondary | ICD-10-CM

## 2016-06-24 LAB — POCT INR: INR: 2.5

## 2016-07-14 ENCOUNTER — Ambulatory Visit (INDEPENDENT_AMBULATORY_CARE_PROVIDER_SITE_OTHER): Payer: Medicare Other | Admitting: *Deleted

## 2016-07-14 DIAGNOSIS — E538 Deficiency of other specified B group vitamins: Secondary | ICD-10-CM

## 2016-07-14 MED ORDER — CYANOCOBALAMIN 1000 MCG/ML IJ SOLN
1000.0000 ug | Freq: Once | INTRAMUSCULAR | Status: AC
  Administered 2016-07-14: 1000 ug via INTRAMUSCULAR

## 2016-07-15 ENCOUNTER — Other Ambulatory Visit: Payer: Self-pay

## 2016-07-15 MED ORDER — PANTOPRAZOLE SODIUM 40 MG PO TBEC: 40.0000 mg | DELAYED_RELEASE_TABLET | Freq: Every day | ORAL | 2 refills | Status: DC

## 2016-07-15 NOTE — Telephone Encounter (Signed)
Pt left note requesting refill pantoprazole to [facility] [address]. Last annual 03/18/16. Pt notified refill done per protocol and pt will ck with pharmacy.

## 2016-07-21 ENCOUNTER — Ambulatory Visit (INDEPENDENT_AMBULATORY_CARE_PROVIDER_SITE_OTHER): Payer: Self-pay

## 2016-07-21 ENCOUNTER — Encounter: Payer: Self-pay | Admitting: Internal Medicine

## 2016-07-21 ENCOUNTER — Ambulatory Visit (INDEPENDENT_AMBULATORY_CARE_PROVIDER_SITE_OTHER): Payer: Medicare Other | Admitting: Internal Medicine

## 2016-07-21 VITALS — BP 122/76 | HR 61 | Ht 70.0 in | Wt 151.0 lb

## 2016-07-21 DIAGNOSIS — I4891 Unspecified atrial fibrillation: Secondary | ICD-10-CM

## 2016-07-21 DIAGNOSIS — Z79899 Other long term (current) drug therapy: Secondary | ICD-10-CM | POA: Diagnosis not present

## 2016-07-21 DIAGNOSIS — Z7901 Long term (current) use of anticoagulants: Secondary | ICD-10-CM | POA: Diagnosis not present

## 2016-07-21 DIAGNOSIS — I495 Sick sinus syndrome: Secondary | ICD-10-CM

## 2016-07-21 DIAGNOSIS — I480 Paroxysmal atrial fibrillation: Secondary | ICD-10-CM

## 2016-07-21 DIAGNOSIS — Z950 Presence of cardiac pacemaker: Secondary | ICD-10-CM | POA: Diagnosis not present

## 2016-07-21 LAB — CUP PACEART INCLINIC DEVICE CHECK
Battery Remaining Longevity: 119 mo
Battery Voltage: 3.04 V
Brady Statistic AP VS Percent: 90.1 %
Brady Statistic AS VS Percent: 7.88 %
Date Time Interrogation Session: 20180424143424
Implantable Lead Implant Date: 20070525
Implantable Lead Location: 753859
Implantable Lead Location: 753860
Implantable Lead Model: 5076
Implantable Pulse Generator Implant Date: 20171127
Lead Channel Impedance Value: 418 Ohm
Lead Channel Pacing Threshold Amplitude: 0.625 V
Lead Channel Sensing Intrinsic Amplitude: 1.625 mV
Lead Channel Sensing Intrinsic Amplitude: 1.75 mV
Lead Channel Sensing Intrinsic Amplitude: 2.25 mV
Lead Channel Setting Pacing Amplitude: 2.5 V
Lead Channel Setting Sensing Sensitivity: 0.6 mV
MDC IDC LEAD IMPLANT DT: 20070525
MDC IDC MSMT LEADCHNL RA IMPEDANCE VALUE: 342 Ohm
MDC IDC MSMT LEADCHNL RA IMPEDANCE VALUE: 380 Ohm
MDC IDC MSMT LEADCHNL RA PACING THRESHOLD PULSEWIDTH: 0.4 ms
MDC IDC MSMT LEADCHNL RV IMPEDANCE VALUE: 361 Ohm
MDC IDC MSMT LEADCHNL RV PACING THRESHOLD AMPLITUDE: 0.75 V
MDC IDC MSMT LEADCHNL RV PACING THRESHOLD PULSEWIDTH: 0.4 ms
MDC IDC MSMT LEADCHNL RV SENSING INTR AMPL: 2 mV
MDC IDC SET LEADCHNL RA PACING AMPLITUDE: 2 V
MDC IDC SET LEADCHNL RV PACING PULSEWIDTH: 0.4 ms
MDC IDC STAT BRADY AP VP PERCENT: 0.53 %
MDC IDC STAT BRADY AS VP PERCENT: 1.49 %
MDC IDC STAT BRADY RA PERCENT PACED: 88.86 %
MDC IDC STAT BRADY RV PERCENT PACED: 2.06 %

## 2016-07-21 LAB — POCT INR: INR: 2.3

## 2016-07-21 NOTE — Progress Notes (Signed)
Electrophysiology Office Note    Date:  07/21/2016   ID:  [name] [name], DOB 30-Jan-1923, MRN [id number]  PCP:  [name] Loffler, MD  Cardiologist:  Primary Electrophysiologist:  [name] Axe, MD    Chief Complaint  Patient presents with  . other    PPM 91 day f/u post-gen change//ECM. Pt states he is doing well. Reviewed meds with pt verbally.     History of Present Illness: [name] [name] is a 83 y.o. male is seen today in followup for  pacemaker implanted for tachybradycardia syndrome in  the context of paroxysmal atrial fibrillation with prior failed ablation. He takes amiodarone. He has had mostly sinus ( 97%) from interrogation 7/17  He underwent device generator replacement 11/17  He has a history of orthostasis in the context of hypertension. No  recent problems with dizziness.  Denies chest pain shortness of breath     Hi  10/16 TSH 8.77 SGOT 33   Chart review synthroid increased 100>>112 3/17 TSH 4.92 4/17 TSH 1.25 SGOT 7/17 TSH 0.71    .   Patient denies symptoms of GI intolerance, sun sensitivity, neurological symptoms or cough attributable to amiodarone.  His wife has been hospitalized recently and is now in rehab    Past Medical History:  Diagnosis Date  . Cardiac pacemaker in situ 07/2005   symptomatic bradycardia  . Diverticulosis of colon (without mention of hemorrhage)   . 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 GERD (gastroesophageal reflux disease)   . GI hemorrhage   . Hyperlipidemia   . Hypertension   . Neck arthritis, Severe, multi-level 06/09/2013  . Paroxysmal atrial fibrillation ([name])    s/p failed ablation  . Peptic ulcer, unspecified site, unspecified as acute or chronic, without mention of hemorrhage, perforation, or obstruction 1975  . Personal history of malignant neoplasm of prostate 12/2000   5 weeks radiation, radiation seeds  . Radiation proctitis   .  Sinoatrial node dysfunction ([name])   . Unspecified glaucoma(365.9)   . Unspecified hypothyroidism   . Vitamin B12 deficiency    Past Surgical History:  Procedure Laterality Date  . CARDIAC CATHETERIZATION    . CARDIAC ELECTROPHYSIOLOGY [place] AND ABLATION  2001, 2003   failure  . CATARACT EXTRACTION    . EP IMPLANTABLE DEVICE N/A 02/24/2016   Procedure: PPM Generator Changeout;  Surgeon: [name] Sprang, MD;  Location: [facility];  Service: Cardiovascular;  Laterality: N/A;  . [place], 2001, 2003   s/p drainage and complications, 05/2200, 07/4268 mesh removal  . INSERT / REPLACE / REMOVE PACEMAKER  07/2005   symptomatic bradycardia  . NOSE SURGERY     cancer removed   . TOOTH EXTRACTION       Current Outpatient Prescriptions  Medication Sig Dispense Refill  . alendronate (FOSAMAX) 70 MG tablet Take 1 tablet (70 mg total) by mouth every 7 (seven) days. Take with a full glass of water on an empty stomach. 12 tablet 3  . amiodarone (PACERONE) 200 MG tablet Take 0.5 tablets (100 mg total) by mouth daily. 135 tablet 3  . clindamycin (CLEOCIN) 300 MG capsule Take 600 mg by mouth daily as needed. 2 tablets by mouth 1 hour prior to dental procedure     . Cyanocobalamin (VITAMIN B-12 IJ) Inject 1,000 mcg/mL as directed every 30 (thirty) days.      . dorzolamide-timolol (COSOPT) 22.3-6.8 MG/ML  ophthalmic solution Place 1 drop into both eyes 2 (two) times daily.      . ferrous sulfate 325 (65 FE) MG EC tablet Take 325 mg by mouth daily with breakfast.      . fluocinonide ointment (LIDEX) 2.22 % Apply 1 application topically 2 (two) times daily. (Patient taking differently: Apply 1 application topically as needed. ) 60 g 1  . levothyroxine (SYNTHROID, LEVOTHROID) 100 MCG tablet Take 1 tablet (100 mcg total) by mouth daily before breakfast. 90 tablet 3  . Multiple Vitamin (MULTIVITAMIN) tablet Take 1 tablet by mouth daily.      . pantoprazole (PROTONIX) 40 MG tablet Take 1 tablet  (40 mg total) by mouth daily. 90 tablet 2  . pravastatin (PRAVACHOL) 40 MG tablet Take 0.5 tablets (20 mg total) by mouth daily. 90 tablet 3  . torsemide (DEMADEX) 20 MG tablet Take 1 tablet (20 mg total) by mouth daily as needed. 90 tablet 1  . warfarin (COUMADIN) 4 MG tablet Take as directed by coumadin clinic (Patient taking differently: Take 4 mg by mouth. 1mg  on Sundays, Tuesdays, Thursdays, and Friday and 2mg  on Monday, Wednesday, and Saturdays.) 90 tablet 1  . Wheat Dextrin (BENEFIBER DRINK MIX PO) Take by mouth as directed.       No current facility-administered medications for this visit.     Allergies:   Penicillins   Social History:  The patient  reports that he has never smoked. He has never used smokeless tobacco. He reports that he does not drink alcohol or use drugs.   Family History:  The patient's    family history includes Bone cancer in his father; Breast cancer in his mother.    ROS:  Please see the history of present illness and past medical histor .   All other systems are reviewed and negative.    PHYSICAL EXAM: VS:  BP 122/76 (BP Location: Left Arm, Patient Position: Sitting, Cuff Size: Normal)   Pulse 61   Ht 5\' 10"  (1.778 m)   Wt 151 lb (68.5 kg)   BMI 21.67 kg/m  , BMI Body mass index is 21.67 kg/m. GEN: Well nourished, well developed, in no acute distress  HEENT: normal  Neck:  JVD flat , carotid bruits, or masses Cardiac: REGULAR RATE and RHYTHM ; 2/6 murmurs, rubs, No S4  Back without kyphosis or CVAT Device pocket well healed; without hematoma or erythema.  There is no tethering  Respiratory:  clear to auscultation bilaterally, normal work of breathing GI: soft, nontender, nondistended, + BS MS: no deformity or atrophy  Extremities no clubbing cyanosis  2+ edema Skin: warm and dry,  Neuro:  Strength and sensation are intact Psych: euthymic mood, full affect   ECG Personally reviewed  demonstrates atrial pacing at 61 Intervals 25/09/42 LAE       Device interrogation is reviewed today in detail.  See PaceArt for details.      Lipid Panel     Component Value Date/Time   CHOL 133 12/25/2015 0833   TRIG 60.0 12/25/2015 0833   HDL 63.70 12/25/2015 0833   CHOLHDL 2 12/25/2015 0833   VLDL 12.0 12/25/2015 0833   LDLCALC 57 12/25/2015 0833     Wt Readings from Last 3 Encounters:  07/21/16 151 lb (68.5 kg)  03/18/16 149 lb 12 oz (67.9 kg)  02/24/16 147 lb (66.7 kg)      Other studies Reviewed: As above    ASSESSMENT AND PLAN:  Atrial fibrillation - paroxysmal  Sinus node dysfunction   Pacemaker  Medtronic  The patient's device was interrogated.  The information was reviewed. No changes  Hypothyroidism-   Amiodarone therapy  Edema-chronic   We will need to check TSH  Not since 7/17   Check also LFTs  No signif Afib  No bleeding issues  Pts edema present x years--all his shoes fit his swollen feet so will leave alone       Orders Placed This Encounter  Procedures  . Hepatic function panel  . TSH  . EKG 12-Lead       Signed, [name] Axe, MD  07/21/2016 11:01 AM     [facility] [address] [place] [address] [phone] (office) [phone] (fax)

## 2016-07-21 NOTE — Patient Instructions (Signed)
Medication Instructions: - Your physician recommends that you continue on your current medications as directed. Please refer to the Current Medication list given to you today.  Labwork: - Your physician recommends that you have lab work today: Liver/ TSH  Procedures/Testing: - none ordered  Follow-Up: - Remote monitoring is used to monitor your Pacemaker of ICD from home. This monitoring reduces the number of office visits required to check your device to one time per year. It allows [place] to keep an eye on the functioning of your device to ensure it is working properly. You are scheduled for a device check from home on 10/20/16. You may send your transmission at any time that day. If you have a wireless device, the transmission will be sent automatically. After your physician reviews your transmission, you will receive a postcard with your next transmission date.  - Your physician wants you to follow-up in: 6 months with Dr. Caryl Comes. You will receive a reminder letter in the mail two months in advance. If you don't receive a letter, please call our office to schedule the follow-up appointment.   Any Additional Special Instructions Will Be Listed Below (If Applicable).     If you need a refill on your cardiac medications before your next appointment, please call your pharmacy.

## 2016-07-22 DIAGNOSIS — Z1809 Other retained radioactive fragments: Secondary | ICD-10-CM | POA: Diagnosis not present

## 2016-07-22 DIAGNOSIS — R140 Abdominal distension (gaseous): Secondary | ICD-10-CM | POA: Diagnosis not present

## 2016-07-22 DIAGNOSIS — T191XXA Foreign body in bladder, initial encounter: Secondary | ICD-10-CM | POA: Diagnosis not present

## 2016-07-22 DIAGNOSIS — Z6821 Body mass index (BMI) 21.0-21.9, adult: Secondary | ICD-10-CM | POA: Diagnosis not present

## 2016-07-22 DIAGNOSIS — Z8546 Personal history of malignant neoplasm of prostate: Secondary | ICD-10-CM | POA: Diagnosis not present

## 2016-07-22 DIAGNOSIS — K3189 Other diseases of stomach and duodenum: Secondary | ICD-10-CM | POA: Diagnosis not present

## 2016-07-22 DIAGNOSIS — Z950 Presence of cardiac pacemaker: Secondary | ICD-10-CM | POA: Diagnosis not present

## 2016-07-22 DIAGNOSIS — I998 Other disorder of circulatory system: Secondary | ICD-10-CM | POA: Diagnosis not present

## 2016-07-22 DIAGNOSIS — T191XXD Foreign body in bladder, subsequent encounter: Secondary | ICD-10-CM | POA: Diagnosis not present

## 2016-07-23 LAB — HEPATIC FUNCTION PANEL
ALT: 22 IU/L (ref 0–44)
AST: 33 IU/L (ref 0–40)
Albumin: 3.6 g/dL (ref 3.2–4.6)
Alkaline Phosphatase: 107 IU/L (ref 39–117)
Bilirubin Total: 0.8 mg/dL (ref 0.0–1.2)
Bilirubin, Direct: 0.26 mg/dL (ref 0.00–0.40)
TOTAL PROTEIN: 6.5 g/dL (ref 6.0–8.5)

## 2016-07-23 LAB — TSH: TSH: 0.382 u[IU]/mL — ABNORMAL LOW (ref 0.450–4.500)

## 2016-07-30 ENCOUNTER — Telehealth: Payer: Self-pay

## 2016-07-30 NOTE — Telephone Encounter (Signed)
Pt is aware of normal liver function panel and abnormality in TSH. Patient agreed to have recheck scheduled for Friday 10/30/16 in [place]. I told him someone would be calling to remind him of appointment once it is scheduled as I do not know how to schedule for the [facility]. He is agreeable to plan

## 2016-08-04 ENCOUNTER — Other Ambulatory Visit: Payer: Self-pay | Admitting: *Deleted

## 2016-08-04 DIAGNOSIS — R7989 Other specified abnormal findings of blood chemistry: Secondary | ICD-10-CM

## 2016-08-18 ENCOUNTER — Ambulatory Visit (INDEPENDENT_AMBULATORY_CARE_PROVIDER_SITE_OTHER): Payer: Medicare Other | Admitting: *Deleted

## 2016-08-18 DIAGNOSIS — E538 Deficiency of other specified B group vitamins: Secondary | ICD-10-CM | POA: Diagnosis not present

## 2016-08-18 MED ORDER — CYANOCOBALAMIN 1000 MCG/ML IJ SOLN
1000.0000 ug | Freq: Once | INTRAMUSCULAR | Status: AC
Start: 2016-08-18 — End: 2016-08-18
  Administered 2016-08-18: 1000 ug via INTRAMUSCULAR

## 2016-08-19 ENCOUNTER — Ambulatory Visit (INDEPENDENT_AMBULATORY_CARE_PROVIDER_SITE_OTHER): Payer: Medicare Other

## 2016-08-19 DIAGNOSIS — Z7901 Long term (current) use of anticoagulants: Secondary | ICD-10-CM

## 2016-08-19 DIAGNOSIS — I480 Paroxysmal atrial fibrillation: Secondary | ICD-10-CM | POA: Diagnosis not present

## 2016-08-19 DIAGNOSIS — I4891 Unspecified atrial fibrillation: Secondary | ICD-10-CM | POA: Diagnosis not present

## 2016-08-19 LAB — POCT INR: INR: 3.9

## 2016-09-02 ENCOUNTER — Ambulatory Visit (INDEPENDENT_AMBULATORY_CARE_PROVIDER_SITE_OTHER): Payer: Medicare Other | Admitting: *Deleted

## 2016-09-02 DIAGNOSIS — I4891 Unspecified atrial fibrillation: Secondary | ICD-10-CM

## 2016-09-02 DIAGNOSIS — Z7901 Long term (current) use of anticoagulants: Secondary | ICD-10-CM | POA: Diagnosis not present

## 2016-09-02 LAB — POCT INR: INR: 1.6

## 2016-09-03 ENCOUNTER — Other Ambulatory Visit: Payer: Self-pay | Admitting: Family Medicine

## 2016-09-03 DIAGNOSIS — Z79899 Other long term (current) drug therapy: Secondary | ICD-10-CM

## 2016-09-03 DIAGNOSIS — E538 Deficiency of other specified B group vitamins: Secondary | ICD-10-CM

## 2016-09-03 DIAGNOSIS — E039 Hypothyroidism, unspecified: Secondary | ICD-10-CM

## 2016-09-03 DIAGNOSIS — E559 Vitamin D deficiency, unspecified: Secondary | ICD-10-CM

## 2016-09-03 DIAGNOSIS — Z862 Personal history of diseases of the blood and blood-forming organs and certain disorders involving the immune mechanism: Secondary | ICD-10-CM

## 2016-09-03 DIAGNOSIS — E7800 Pure hypercholesterolemia, unspecified: Secondary | ICD-10-CM

## 2016-09-11 ENCOUNTER — Other Ambulatory Visit (INDEPENDENT_AMBULATORY_CARE_PROVIDER_SITE_OTHER): Payer: Medicare Other

## 2016-09-11 DIAGNOSIS — Z862 Personal history of diseases of the blood and blood-forming organs and certain disorders involving the immune mechanism: Secondary | ICD-10-CM

## 2016-09-11 DIAGNOSIS — E7800 Pure hypercholesterolemia, unspecified: Secondary | ICD-10-CM | POA: Diagnosis not present

## 2016-09-11 DIAGNOSIS — Z79899 Other long term (current) drug therapy: Secondary | ICD-10-CM | POA: Diagnosis not present

## 2016-09-11 DIAGNOSIS — E039 Hypothyroidism, unspecified: Secondary | ICD-10-CM

## 2016-09-11 DIAGNOSIS — E538 Deficiency of other specified B group vitamins: Secondary | ICD-10-CM

## 2016-09-11 DIAGNOSIS — E559 Vitamin D deficiency, unspecified: Secondary | ICD-10-CM | POA: Diagnosis not present

## 2016-09-11 LAB — CBC WITH DIFFERENTIAL/PLATELET
BASOS ABS: 0.1 10*3/uL (ref 0.0–0.1)
Basophils Relative: 1 % (ref 0.0–3.0)
EOS ABS: 0.2 10*3/uL (ref 0.0–0.7)
Eosinophils Relative: 3.2 % (ref 0.0–5.0)
HCT: 39.7 % (ref 39.0–52.0)
Hemoglobin: 12.9 g/dL — ABNORMAL LOW (ref 13.0–17.0)
LYMPHS ABS: 1.7 10*3/uL (ref 0.7–4.0)
Lymphocytes Relative: 28.9 % (ref 12.0–46.0)
MCHC: 32.5 g/dL (ref 30.0–36.0)
MCV: 97.2 fl (ref 78.0–100.0)
MONO ABS: 0.6 10*3/uL (ref 0.1–1.0)
Monocytes Relative: 10.6 % (ref 3.0–12.0)
NEUTROS ABS: 3.4 10*3/uL (ref 1.4–7.7)
Neutrophils Relative %: 56.3 % (ref 43.0–77.0)
PLATELETS: 243 10*3/uL (ref 150.0–400.0)
RBC: 4.08 Mil/uL — AB (ref 4.22–5.81)
RDW: 13.2 % (ref 11.5–15.5)
WBC: 6 10*3/uL (ref 4.0–10.5)

## 2016-09-11 LAB — LIPID PANEL
Cholesterol: 144 mg/dL (ref 0–200)
HDL: 56.4 mg/dL (ref >39.00)
LDL CALC: 73 mg/dL (ref 0–99)
NONHDL: 87.4
TRIGLYCERIDES: 71 mg/dL (ref 0.0–149.0)
Total CHOL/HDL Ratio: 3
VLDL: 14.2 mg/dL (ref 0.0–40.0)

## 2016-09-11 LAB — IBC PANEL
Iron: 73 ug/dL (ref 42–165)
Saturation Ratios: 24.6 % (ref 20.0–50.0)
TRANSFERRIN: 212 mg/dL (ref 212.0–360.0)

## 2016-09-11 LAB — BASIC METABOLIC PANEL
BUN: 23 mg/dL (ref 6–23)
CALCIUM: 9.5 mg/dL (ref 8.4–10.5)
CO2: 31 meq/L (ref 19–32)
CREATININE: 1.39 mg/dL (ref 0.40–1.50)
Chloride: 105 mEq/L (ref 96–112)
GFR: 50.86 mL/min — AB (ref >60.00)
GLUCOSE: 102 mg/dL — AB (ref 70–99)
Potassium: 4 mEq/L (ref 3.5–5.1)
Sodium: 141 mEq/L (ref 135–145)

## 2016-09-11 LAB — HEPATIC FUNCTION PANEL
ALK PHOS: 100 U/L (ref 39–117)
ALT: 19 U/L (ref 0–53)
AST: 25 U/L (ref 0–37)
Albumin: 3.8 g/dL (ref 3.5–5.2)
BILIRUBIN TOTAL: 1 mg/dL (ref 0.2–1.2)
Bilirubin, Direct: 0.2 mg/dL (ref 0.0–0.3)
Total Protein: 6.5 g/dL (ref 6.0–8.3)

## 2016-09-11 LAB — FERRITIN: Ferritin: 133.2 ng/mL (ref 22.0–322.0)

## 2016-09-11 LAB — T4, FREE: FREE T4: 1.19 ng/dL (ref 0.60–1.60)

## 2016-09-11 LAB — VITAMIN D 25 HYDROXY (VIT D DEFICIENCY, FRACTURES): VITD: 30.69 ng/mL (ref 30.00–100.00)

## 2016-09-11 LAB — VITAMIN B12: VITAMIN B 12: 624 pg/mL (ref 211–911)

## 2016-09-11 LAB — TSH: TSH: 0.66 u[IU]/mL (ref 0.35–4.50)

## 2016-09-11 LAB — T3, FREE: T3 FREE: 2.2 pg/mL — AB (ref 2.3–4.2)

## 2016-09-16 ENCOUNTER — Ambulatory Visit (INDEPENDENT_AMBULATORY_CARE_PROVIDER_SITE_OTHER): Payer: Medicare Other | Admitting: *Deleted

## 2016-09-16 ENCOUNTER — Ambulatory Visit (INDEPENDENT_AMBULATORY_CARE_PROVIDER_SITE_OTHER): Payer: Medicare Other | Admitting: Family Medicine

## 2016-09-16 ENCOUNTER — Encounter: Payer: Self-pay | Admitting: Family Medicine

## 2016-09-16 VITALS — BP 124/76 | HR 81 | Temp 97.8°F | Wt 153.0 lb

## 2016-09-16 DIAGNOSIS — E784 Other hyperlipidemia: Secondary | ICD-10-CM

## 2016-09-16 DIAGNOSIS — E038 Other specified hypothyroidism: Secondary | ICD-10-CM | POA: Diagnosis not present

## 2016-09-16 DIAGNOSIS — I480 Paroxysmal atrial fibrillation: Secondary | ICD-10-CM

## 2016-09-16 DIAGNOSIS — I10 Essential (primary) hypertension: Secondary | ICD-10-CM

## 2016-09-16 DIAGNOSIS — E538 Deficiency of other specified B group vitamins: Secondary | ICD-10-CM | POA: Diagnosis not present

## 2016-09-16 DIAGNOSIS — E7849 Other hyperlipidemia: Secondary | ICD-10-CM

## 2016-09-16 LAB — POCT INR: INR: 2.3

## 2016-09-16 NOTE — Progress Notes (Signed)
Dr. Frederico Hamman T. Shye Doty, MD, [facility] [place], [address] Phone: [phone] Fax: [phone]  09/16/2016  Patient: [name] [name], MRN: [id number], DOB: 1922/04/21, 83 y.o.  Primary Physician:  Owens Loffler, MD   Chief Complaint  Patient presents with  . Follow-up   Subjective:   [name] [name] is a 83 y.o. very pleasant male patient who presents with the following:  F/u: Pleasant 83 year old gentleman with A. fib, high blood pressure, glaucoma, hypothyroidism, and hyperlipidemia and a history of prostate cancer who is here today and generally doing quite well with the exception of some shoulder ache.  Thyroid: No symptoms. Labs reviewed. Denies cold / heat intolerance, dry skin, hair loss. No goiter.  Lab Results  Component Value Date   TSH 0.66 09/11/2016     Decreased appetite.   R shoulder is hurting and pain. Pain in abduction - not a great deal.   Bumped into door.   Past Medical History, Surgical History, Social History, Family History, Problem List, Medications, and Allergies have been reviewed and updated if relevant.  Patient Active Problem List   Diagnosis Date Noted  . PACEMAKER, PERMANENT 08/07/2008    Priority: Medium  . PAROXYSMAL ATRIAL FIBRILLATION 02/06/2008    Priority: Medium  . DDD (degenerative disc disease), cervical, Severe 06/09/2013  . Neck arthritis, Severe, multi-level 06/09/2013  . Sinoatrial node dysfunction ([name])   . Long term (current) use of anticoagulants 06/24/2010  . IRON DEFICIENCY 04/17/2010  . GLAUCOMA 08/07/2008  . GERD 08/07/2008  . Vitamin B 12 deficiency 07/19/2008  . PEPTIC ULCER DISEASE 07/18/2008  . RADIATION PROCTITIS 07/18/2008  . DIVERTICULOSIS, COLON 07/18/2008  . Prostate cancer, in remission 07/18/2008  . Essential hypertension 05/09/2008  . Hyperlipidemia 02/06/2008  . Hypothyroidism 12/01/2007  . HERN UNS SITE ABD CAV W/O MENTION  OBST/GANGREN 12/01/2007    Past Medical History:  Diagnosis Date  . Cardiac pacemaker in situ 07/2005   symptomatic bradycardia  . Diverticulosis of colon (without mention of hemorrhage)   . 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 GERD (gastroesophageal reflux disease)   . GI hemorrhage   . Hyperlipidemia   . Hypertension   . Neck arthritis, Severe, multi-level 06/09/2013  . Paroxysmal atrial fibrillation ([name])    s/p failed ablation  . Peptic ulcer, unspecified site, unspecified as acute or chronic, without mention of hemorrhage, perforation, or obstruction 1975  . Personal history of malignant neoplasm of prostate 12/2000   5 weeks radiation, radiation seeds  . Radiation proctitis   . Sinoatrial node dysfunction ([name])   . Unspecified glaucoma(365.9)   . Unspecified hypothyroidism   . Vitamin B12 deficiency     Past Surgical History:  Procedure Laterality Date  . CARDIAC CATHETERIZATION    . CARDIAC ELECTROPHYSIOLOGY [place] AND ABLATION  2001, 2003   failure  . CATARACT EXTRACTION    . EP IMPLANTABLE DEVICE N/A 02/24/2016   Procedure: PPM Generator Changeout;  Surgeon: Deboraha Sprang, MD;  Location: [facility];  Service: Cardiovascular;  Laterality: N/A;  . [place], 2001, 2003   s/p drainage and complications, 10/1827, 11/3714 mesh removal  . INSERT / REPLACE / REMOVE PACEMAKER  07/2005   symptomatic bradycardia  . NOSE SURGERY     cancer removed   . TOOTH EXTRACTION      Social History  Social History  . Marital status: Widowed    Spouse name: N/A  . Number of children: N/A  . Years of education: N/A   Occupational History  . Not on file.   Social History Main Topics  . Smoking status: Never Smoker  . Smokeless tobacco: Never Used  . Alcohol use No  . Drug use: No  . Sexual activity: Not on file   Other Topics Concern  . Not on file   Social History Narrative  . No  narrative on file    Family History  Problem Relation Age of Onset  . Breast cancer Mother   . Bone cancer Father   . Alcohol abuse Unknown   . Coronary artery disease Unknown   . Dementia Unknown   . Diabetes Unknown     Allergies  Allergen Reactions  . Penicillins Rash    Has patient had a PCN reaction causing immediate rash, facial/tongue/throat swelling, SOB or lightheadedness with hypotension: {no Has patient had a PCN reaction causing severe rash involving mucus membranes or skin necrosis: {no Has patient had a PCN reaction that required hospitalization {no Has patient had a PCN reaction occurring within the last 10 years: {no If all of the above answers are "NO", then may proceed with Cephalosporin use.    Medication list reviewed and updated in full in [place].   GEN: No acute illnesses, no fevers, chills. GI: No n/v/d, eating normally Pulm: No SOB Interactive and getting along well at home.  Otherwise, ROS is as per the HPI.  Objective:   Pulse 81   Temp 97.8 F (36.6 C) (Oral)   Wt 153 lb (69.4 kg)   SpO2 98%   BMI 21.95 kg/m   GEN: WDWN, NAD, Non-toxic, A & O x 3 HEENT: Atraumatic, Normocephalic. Neck supple. No masses, No LAD. Ears and Nose: No external deformity. CV: RRR, No M/G/R. No JVD. No thrill. No extra heart sounds. PULM: CTA B, no wheezes, crackles, rhonchi. No retractions. No resp. distress. No accessory muscle use. EXTR: No c/c/e NEURO Normal gait.  PSYCH: Normally interactive. Conversant. Not depressed or anxious appearing.  Calm demeanor.   Crepitus with motion of the right shoulder, and some pain with terminal abduction.  Laboratory and Imaging Data: Results for orders placed or performed in visit on 09/11/16  Lipid panel  Result Value Ref Range   Cholesterol 144 0 - 200 mg/dL   Triglycerides 71.0 0.0 - 149.0 mg/dL   HDL 56.40 >39.00 mg/dL   VLDL 14.2 0.0 - 40.0 mg/dL   LDL Cholesterol 73 0 - 99 mg/dL   Total CHOL/HDL  Ratio 3    NonHDL 87.40   Hepatic function panel  Result Value Ref Range   Total Bilirubin 1.0 0.2 - 1.2 mg/dL   Bilirubin, Direct 0.2 0.0 - 0.3 mg/dL   Alkaline Phosphatase 100 39 - 117 U/L   AST 25 0 - 37 U/L   ALT 19 0 - 53 U/L   Total Protein 6.5 6.0 - 8.3 g/dL   Albumin 3.8 3.5 - 5.2 g/dL  Basic metabolic panel  Result Value Ref Range   Sodium 141 135 - 145 mEq/L   Potassium 4.0 3.5 - 5.1 mEq/L   Chloride 105 96 - 112 mEq/L   CO2 31 19 - 32 mEq/L   Glucose, Bld 102 (H) 70 - 99 mg/dL   BUN 23 6 - 23 mg/dL   Creatinine, Ser 1.39 0.40 - 1.50 mg/dL   Calcium  9.5 8.4 - 10.5 mg/dL   GFR 50.86 (L) >60.00 mL/min  CBC with Differential/Platelet  Result Value Ref Range   WBC 6.0 4.0 - 10.5 K/uL   RBC 4.08 (L) 4.22 - 5.81 Mil/uL   Hemoglobin 12.9 (L) 13.0 - 17.0 g/dL   HCT 39.7 39.0 - 52.0 %   MCV 97.2 78.0 - 100.0 fl   MCHC 32.5 30.0 - 36.0 g/dL   RDW 13.2 11.5 - 15.5 %   Platelets 243.0 150.0 - 400.0 K/uL   Neutrophils Relative % 56.3 43.0 - 77.0 %   Lymphocytes Relative 28.9 12.0 - 46.0 %   Monocytes Relative 10.6 3.0 - 12.0 %   Eosinophils Relative 3.2 0.0 - 5.0 %   Basophils Relative 1.0 0.0 - 3.0 %   Neutro Abs 3.4 1.4 - 7.7 K/uL   Lymphs Abs 1.7 0.7 - 4.0 K/uL   Monocytes Absolute 0.6 0.1 - 1.0 K/uL   Eosinophils Absolute 0.2 0.0 - 0.7 K/uL   Basophils Absolute 0.1 0.0 - 0.1 K/uL  Vitamin B12  Result Value Ref Range   Vitamin B-12 624 211 - 911 pg/mL  VITAMIN D 25 Hydroxy (Vit-D Deficiency, Fractures)  Result Value Ref Range   VITD 30.69 30.00 - 100.00 ng/mL  T3, free  Result Value Ref Range   T3, Free 2.2 (L) 2.3 - 4.2 pg/mL  T4, free  Result Value Ref Range   Free T4 1.19 0.60 - 1.60 ng/dL  TSH  Result Value Ref Range   TSH 0.66 0.35 - 4.50 uIU/mL  IBC panel  Result Value Ref Range   Iron 73 42 - 165 ug/dL   Transferrin 212.0 212.0 - 360.0 mg/dL   Saturation Ratios 24.6 20.0 - 50.0 %  Ferritin  Result Value Ref Range   Ferritin 133.2 22.0 - 322.0  ng/mL     Assessment and Plan:   Other specified hypothyroidism  Paroxysmal atrial fibrillation ([name])  Other hyperlipidemia  Essential hypertension  Vitamin B 12 deficiency  At 49 he is doing very well, and I wouldn't make any significant changes at all. He completely agrees.  Follow-up: Return in about 6 months (around 03/18/2017) for Medicare CPX.   Signed,  [name]. Favio Moder, MD   Allergies as of 09/16/2016      Reactions   Penicillins Rash   Has patient had a PCN reaction causing immediate rash, facial/tongue/throat swelling, SOB or lightheadedness with hypotension: {no Has patient had a PCN reaction causing severe rash involving mucus membranes or skin necrosis: {no Has patient had a PCN reaction that required hospitalization {no Has patient had a PCN reaction occurring within the last 10 years: {no If all of the above answers are "NO", then may proceed with Cephalosporin use.      Medication List       Accurate as of 09/16/16  1:43 PM. Always use your most recent med list.          alendronate 70 MG tablet Commonly known as:  FOSAMAX Take 1 tablet (70 mg total) by mouth every 7 (seven) days. Take with a full glass of water on an empty stomach.   amiodarone 200 MG tablet Commonly known as:  PACERONE Take 0.5 tablets (100 mg total) by mouth daily.   BENEFIBER DRINK MIX PO Take by mouth as directed.   clindamycin 300 MG capsule Commonly known as:  CLEOCIN Take 600 mg by mouth daily as needed. 2 tablets by mouth 1 hour prior to dental procedure  dorzolamide-timolol 22.3-6.8 MG/ML ophthalmic solution Commonly known as:  COSOPT Place 1 drop into both eyes 2 (two) times daily.   ferrous sulfate 325 (65 FE) MG EC tablet Take 325 mg by mouth daily with breakfast.   fluocinonide ointment 0.05 % Commonly known as:  LIDEX Apply 1 application topically 2 (two) times daily.   levothyroxine 100 MCG tablet Commonly known as:  SYNTHROID, LEVOTHROID Take 1  tablet (100 mcg total) by mouth daily before breakfast.   multivitamin tablet Take 1 tablet by mouth daily.   pantoprazole 40 MG tablet Commonly known as:  PROTONIX Take 1 tablet (40 mg total) by mouth daily.   pravastatin 40 MG tablet Commonly known as:  PRAVACHOL Take 0.5 tablets (20 mg total) by mouth daily.   torsemide 20 MG tablet Commonly known as:  DEMADEX Take 1 tablet (20 mg total) by mouth daily as needed.   VITAMIN B-12 IJ Inject 1,000 mcg/mL as directed every 30 (thirty) days.   warfarin 4 MG tablet Commonly known as:  COUMADIN Take as directed by coumadin clinic

## 2016-09-23 DIAGNOSIS — H401132 Primary open-angle glaucoma, bilateral, moderate stage: Secondary | ICD-10-CM | POA: Diagnosis not present

## 2016-09-29 ENCOUNTER — Ambulatory Visit (INDEPENDENT_AMBULATORY_CARE_PROVIDER_SITE_OTHER): Payer: Medicare Other | Admitting: *Deleted

## 2016-09-29 DIAGNOSIS — E538 Deficiency of other specified B group vitamins: Secondary | ICD-10-CM | POA: Diagnosis not present

## 2016-09-29 MED ORDER — CYANOCOBALAMIN 1000 MCG/ML IJ SOLN
1000.0000 ug | Freq: Once | INTRAMUSCULAR | Status: AC
  Administered 2016-09-29: 1000 ug via INTRAMUSCULAR

## 2016-10-07 ENCOUNTER — Ambulatory Visit (INDEPENDENT_AMBULATORY_CARE_PROVIDER_SITE_OTHER): Payer: Medicare Other | Admitting: *Deleted

## 2016-10-07 DIAGNOSIS — I480 Paroxysmal atrial fibrillation: Secondary | ICD-10-CM

## 2016-10-07 LAB — POCT INR: INR: 1.9

## 2016-10-20 ENCOUNTER — Telehealth: Payer: Self-pay | Admitting: Cardiology

## 2016-10-20 ENCOUNTER — Ambulatory Visit (INDEPENDENT_AMBULATORY_CARE_PROVIDER_SITE_OTHER): Payer: Medicare Other | Admitting: *Deleted

## 2016-10-20 DIAGNOSIS — I495 Sick sinus syndrome: Secondary | ICD-10-CM | POA: Diagnosis not present

## 2016-10-20 NOTE — Telephone Encounter (Signed)
LMOVM reminding pt to send remote transmission.   

## 2016-10-23 ENCOUNTER — Encounter: Payer: Self-pay | Admitting: Cardiology

## 2016-10-23 NOTE — Progress Notes (Signed)
Remote pacemaker transmission.   

## 2016-10-27 DIAGNOSIS — L570 Actinic keratosis: Secondary | ICD-10-CM | POA: Diagnosis not present

## 2016-10-27 DIAGNOSIS — L821 Other seborrheic keratosis: Secondary | ICD-10-CM | POA: Diagnosis not present

## 2016-10-27 DIAGNOSIS — Z859 Personal history of malignant neoplasm, unspecified: Secondary | ICD-10-CM | POA: Diagnosis not present

## 2016-10-27 DIAGNOSIS — Z85828 Personal history of other malignant neoplasm of skin: Secondary | ICD-10-CM | POA: Diagnosis not present

## 2016-10-28 ENCOUNTER — Ambulatory Visit (INDEPENDENT_AMBULATORY_CARE_PROVIDER_SITE_OTHER): Payer: Medicare Other

## 2016-10-28 ENCOUNTER — Other Ambulatory Visit: Payer: Medicare Other

## 2016-10-28 DIAGNOSIS — R946 Abnormal results of thyroid function studies: Secondary | ICD-10-CM | POA: Diagnosis not present

## 2016-10-28 DIAGNOSIS — I480 Paroxysmal atrial fibrillation: Secondary | ICD-10-CM | POA: Diagnosis not present

## 2016-10-28 DIAGNOSIS — R7989 Other specified abnormal findings of blood chemistry: Secondary | ICD-10-CM

## 2016-10-28 LAB — POCT INR: INR: 2.1

## 2016-10-29 LAB — TSH: TSH: 0.437 u[IU]/mL — AB (ref 0.450–4.500)

## 2016-11-03 ENCOUNTER — Ambulatory Visit (INDEPENDENT_AMBULATORY_CARE_PROVIDER_SITE_OTHER): Payer: Medicare Other

## 2016-11-03 DIAGNOSIS — E538 Deficiency of other specified B group vitamins: Secondary | ICD-10-CM | POA: Diagnosis not present

## 2016-11-03 MED ORDER — CYANOCOBALAMIN 1000 MCG/ML IJ SOLN
1000.0000 ug | Freq: Once | INTRAMUSCULAR | Status: AC
  Administered 2016-11-03: 1000 ug via INTRAMUSCULAR

## 2016-11-04 DIAGNOSIS — Z6820 Body mass index (BMI) 20.0-20.9, adult: Secondary | ICD-10-CM | POA: Diagnosis not present

## 2016-11-04 DIAGNOSIS — R300 Dysuria: Secondary | ICD-10-CM | POA: Diagnosis not present

## 2016-11-04 DIAGNOSIS — R3911 Hesitancy of micturition: Secondary | ICD-10-CM | POA: Diagnosis not present

## 2016-11-06 ENCOUNTER — Telehealth: Payer: Self-pay | Admitting: Family Medicine

## 2016-11-06 ENCOUNTER — Emergency Department: Payer: Medicare Other

## 2016-11-06 ENCOUNTER — Emergency Department
Admission: EM | Admit: 2016-11-06 | Discharge: 2016-11-06 | Disposition: A | Discharge status: Home / Self Care | Payer: Medicare Other | Attending: Emergency Medicine | Admitting: Emergency Medicine

## 2016-11-06 ENCOUNTER — Encounter: Payer: Self-pay | Admitting: Emergency Medicine

## 2016-11-06 DIAGNOSIS — Z7901 Long term (current) use of anticoagulants: Secondary | ICD-10-CM | POA: Diagnosis not present

## 2016-11-06 DIAGNOSIS — R0989 Other specified symptoms and signs involving the circulatory and respiratory systems: Secondary | ICD-10-CM | POA: Diagnosis not present

## 2016-11-06 DIAGNOSIS — R600 Localized edema: Secondary | ICD-10-CM | POA: Diagnosis not present

## 2016-11-06 DIAGNOSIS — Z79899 Other long term (current) drug therapy: Secondary | ICD-10-CM | POA: Diagnosis not present

## 2016-11-06 DIAGNOSIS — E039 Hypothyroidism, unspecified: Secondary | ICD-10-CM | POA: Diagnosis not present

## 2016-11-06 DIAGNOSIS — I10 Essential (primary) hypertension: Secondary | ICD-10-CM | POA: Diagnosis not present

## 2016-11-06 DIAGNOSIS — R609 Edema, unspecified: Secondary | ICD-10-CM

## 2016-11-06 DIAGNOSIS — M79661 Pain in right lower leg: Secondary | ICD-10-CM | POA: Diagnosis not present

## 2016-11-06 DIAGNOSIS — N390 Urinary tract infection, site not specified: Secondary | ICD-10-CM

## 2016-11-06 DIAGNOSIS — M7989 Other specified soft tissue disorders: Secondary | ICD-10-CM | POA: Diagnosis not present

## 2016-11-06 DIAGNOSIS — M79604 Pain in right leg: Secondary | ICD-10-CM

## 2016-11-06 LAB — COMPREHENSIVE METABOLIC PANEL
ALBUMIN: 3.7 g/dL (ref 3.5–5.0)
ALK PHOS: 84 U/L (ref 38–126)
ALT: 18 U/L (ref 17–63)
ANION GAP: 3 — AB (ref 5–15)
AST: 31 U/L (ref 15–41)
BUN: 17 mg/dL (ref 6–20)
CALCIUM: 9.1 mg/dL (ref 8.9–10.3)
CO2: 28 mmol/L (ref 22–32)
Chloride: 110 mmol/L (ref 101–111)
Creatinine, Ser: 1.12 mg/dL (ref 0.61–1.24)
GFR calc Af Amer: >60 mL/min (ref >60)
GFR calc non Af Amer: 55 mL/min — ABNORMAL LOW (ref >60)
GLUCOSE: 110 mg/dL — AB (ref 65–99)
POTASSIUM: 3.7 mmol/L (ref 3.5–5.1)
Sodium: 141 mmol/L (ref 135–145)
Total Bilirubin: 1.1 mg/dL (ref 0.3–1.2)
Total Protein: 6.6 g/dL (ref 6.5–8.1)

## 2016-11-06 LAB — URINALYSIS, COMPLETE (UACMP) WITH MICROSCOPIC
BACTERIA UA: NONE SEEN
Bilirubin Urine: NEGATIVE
Glucose, UA: NEGATIVE mg/dL
Ketones, ur: NEGATIVE mg/dL
Nitrite: NEGATIVE
PROTEIN: 30 mg/dL — AB
Specific Gravity, Urine: 1.015 (ref 1.005–1.030)
pH: 5 (ref 5.0–8.0)

## 2016-11-06 LAB — CBC WITH DIFFERENTIAL/PLATELET
Basophils Absolute: 0 10*3/uL (ref 0–0.1)
Basophils Relative: 1 %
EOS PCT: 2 %
Eosinophils Absolute: 0.1 10*3/uL (ref 0–0.7)
HEMATOCRIT: 36.8 % — AB (ref 40.0–52.0)
Hemoglobin: 12.3 g/dL — ABNORMAL LOW (ref 13.0–18.0)
LYMPHS PCT: 25 %
Lymphs Abs: 1.1 10*3/uL (ref 1.0–3.6)
MCH: 33 pg (ref 26.0–34.0)
MCHC: 33.4 g/dL (ref 32.0–36.0)
MCV: 99 fL (ref 80.0–100.0)
MONO ABS: 0.4 10*3/uL (ref 0.2–1.0)
MONOS PCT: 9 %
NEUTROS ABS: 2.9 10*3/uL (ref 1.4–6.5)
Neutrophils Relative %: 63 %
PLATELETS: 197 10*3/uL (ref 150–440)
RBC: 3.72 MIL/uL — ABNORMAL LOW (ref 4.40–5.90)
RDW: 13.4 % (ref 11.5–14.5)
WBC: 4.5 10*3/uL (ref 3.8–10.6)

## 2016-11-06 LAB — LACTIC ACID, PLASMA: LACTIC ACID, VENOUS: 0.7 mmol/L (ref 0.5–1.9)

## 2016-11-06 LAB — PROTIME-INR
INR: 1.85
PROTHROMBIN TIME: 21.6 s — AB (ref 11.4–15.2)

## 2016-11-06 LAB — CK: CK TOTAL: 166 U/L (ref 49–397)

## 2016-11-06 MED ORDER — CEFDINIR 300 MG PO CAPS: 300.0000 mg | ORAL_CAPSULE | Freq: Two times a day (BID) | ORAL | 0 refills | Status: DC

## 2016-11-06 NOTE — ED Provider Notes (Signed)
[facility] Emergency Department Provider Note  ____________________________________________   First MD Initiated Contact with Patient 11/06/16 1527     (approximate)  I have reviewed the triage vital signs and the nursing notes.   HISTORY  Chief Complaint Leg Pain   HPI [name] [name] is a 83 y.o. male with a history of atrial fibrillation on Coumadin as well as chronic bilateral lower extremity edema was presenting to the emergency department today with right lower extremity pain and swelling. He says that the swelling to his bilateral lower extremities has been stable for years. However, he recently took himself off of his torsemide over the past 1-2 weeks because of the amount of medications he has been taking. He was also just started 2 days ago on cefuroxime for dysuria which has been experiencing over the past 2-3 weeks. He describes a burning sensation with urination. He says that he still has the sensation when he is urinating despite being on antibiotics now for 2 days. He denies any fevers or chills. Denies any injury and says that the pain to his right lower summary is worse with movement. He describes it as a 7-8 out of 10 aching pain that extends from his calf to his thigh posteriorly.   Past Medical History:  Diagnosis Date  . Cardiac pacemaker in situ 07/2005   symptomatic bradycardia  . Diverticulosis of colon (without mention of hemorrhage)   . 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 GERD (gastroesophageal reflux disease)   . GI hemorrhage   . Hyperlipidemia   . Hypertension   . Neck arthritis, Severe, multi-level 06/09/2013  . Paroxysmal atrial fibrillation ([name])    s/p failed ablation  . Peptic ulcer, unspecified site, unspecified as acute or chronic, without mention of hemorrhage, perforation, or obstruction 1975  . Personal history of malignant neoplasm of prostate  12/2000   5 weeks radiation, radiation seeds  . Radiation proctitis   . Sinoatrial node dysfunction ([name])   . Unspecified glaucoma(365.9)   . Unspecified hypothyroidism   . Vitamin B12 deficiency     Patient Active Problem List   Diagnosis Date Noted  . DDD (degenerative disc disease), cervical, Severe 06/09/2013  . Neck arthritis, Severe, multi-level 06/09/2013  . Sinoatrial node dysfunction ([name])   . Long term (current) use of anticoagulants 06/24/2010  . IRON DEFICIENCY 04/17/2010  . GLAUCOMA 08/07/2008  . GERD 08/07/2008  . PACEMAKER, PERMANENT 08/07/2008  . Vitamin B 12 deficiency 07/19/2008  . PEPTIC ULCER DISEASE 07/18/2008  . RADIATION PROCTITIS 07/18/2008  . DIVERTICULOSIS, COLON 07/18/2008  . Prostate cancer, in remission 07/18/2008  . Essential hypertension 05/09/2008  . Hyperlipidemia 02/06/2008  . PAROXYSMAL ATRIAL FIBRILLATION 02/06/2008  . Hypothyroidism 12/01/2007  . HERN UNS SITE ABD CAV W/O MENTION OBST/GANGREN 12/01/2007    Past Surgical History:  Procedure Laterality Date  . CARDIAC CATHETERIZATION    . CARDIAC ELECTROPHYSIOLOGY [place] AND ABLATION  2001, 2003   failure  . CATARACT EXTRACTION    . EP IMPLANTABLE DEVICE N/A 02/24/2016   Procedure: PPM Generator Changeout;  Surgeon: Deboraha Sprang, MD;  Location: [place] [facility];  Service: Cardiovascular;  Laterality: N/A;  . [place], 2001, 2003   s/p drainage and complications, 0/0762, 05/6331 mesh removal  . INSERT / REPLACE / REMOVE PACEMAKER  07/2005   symptomatic bradycardia  . NOSE SURGERY     cancer  removed   . TOOTH EXTRACTION      Prior to Admission medications   Medication Sig Start Date End Date Taking? Authorizing Provider  alendronate (FOSAMAX) 70 MG tablet Take 1 tablet (70 mg total) by mouth every 7 (seven) days. Take with a full glass of water on an empty stomach. 03/11/16 03/11/17  Copland, Frederico Hamman, MD  amiodarone (PACERONE) 200 MG tablet Take 0.5 tablets (100 mg  total) by mouth daily. 02/12/16   Deboraha Sprang, MD  clindamycin (CLEOCIN) 300 MG capsule Take 600 mg by mouth daily as needed. 2 tablets by mouth 1 hour prior to dental procedure     [provider]  Cyanocobalamin (VITAMIN B-12 IJ) Inject 1,000 mcg/mL as directed every 30 (thirty) days.      [provider]  dorzolamide-timolol (COSOPT) 22.3-6.8 MG/ML ophthalmic solution Place 1 drop into both eyes 2 (two) times daily.      [provider]  ferrous sulfate 325 (65 FE) MG EC tablet Take 325 mg by mouth daily with breakfast.      [provider]  fluocinonide ointment (LIDEX) 7.16 % Apply 1 application topically 2 (two) times daily. Patient taking differently: Apply 1 application topically as needed.  06/03/15   Copland, Frederico Hamman, MD  levothyroxine (SYNTHROID, LEVOTHROID) 100 MCG tablet Take 1 tablet (100 mcg total) by mouth daily before breakfast. 01/28/16   Deboraha Sprang, MD  Multiple Vitamin (MULTIVITAMIN) tablet Take 1 tablet by mouth daily.      [provider]  pantoprazole (PROTONIX) 40 MG tablet Take 1 tablet (40 mg total) by mouth daily. 07/15/16 12/16/17  Copland, Frederico Hamman, MD  pravastatin (PRAVACHOL) 40 MG tablet Take 0.5 tablets (20 mg total) by mouth daily. 03/18/16   Copland, Frederico Hamman, MD  torsemide (DEMADEX) 20 MG tablet Take 1 tablet (20 mg total) by mouth daily as needed. 12/13/15   Copland, Frederico Hamman, MD  warfarin (COUMADIN) 4 MG tablet Take as directed by coumadin clinic Patient taking differently: Take 4 mg by mouth. 1mg  on Sundays, Tuesdays, Thursdays, and Friday and 2mg  on Monday, Wednesday, and Saturdays. 02/12/16   Deboraha Sprang, MD  Wheat Dextrin (BENEFIBER DRINK MIX PO) Take by mouth as directed.      [provider]    Allergies Penicillins  Family History  Problem Relation Age of Onset  . Breast cancer Mother   . Bone cancer Father   . Alcohol abuse Unknown   . Coronary artery disease Unknown   . Dementia Unknown    . Diabetes Unknown     Social History Social History  Substance Use Topics  . Smoking status: Never Smoker  . Smokeless tobacco: Never Used  . Alcohol use No    Review of Systems  Constitutional: No fever/chills Eyes: No visual changes. ENT: No sore throat. Cardiovascular: Denies chest pain. Respiratory: Denies shortness of breath. Gastrointestinal: No abdominal pain.  No nausea, no vomiting.  No diarrhea.  No constipation. Genitourinary: Negative for dysuria. Musculoskeletal: Negative for back pain. Skin: Negative for rash. Neurological: Negative for headaches, focal weakness or numbness.   ____________________________________________   PHYSICAL EXAM:  VITAL SIGNS: ED Triage Vitals  Enc Vitals Group     BP 11/06/16 1237 (!) 145/84     Pulse Rate 11/06/16 1237 (!) 59     Resp 11/06/16 1237 16     Temp 11/06/16 1237 97.8 F (36.6 C)     Temp Source 11/06/16 1237 Oral     SpO2 11/06/16 1237 95 %  Weight 11/06/16 1236 143 lb (64.9 kg)     Height 11/06/16 1236 5\' 9"  (1.753 m)     Head Circumference --      Peak Flow --      Pain Score 11/06/16 1235 0     Pain Loc --      Pain Edu? --      Excl. in [place]? --     Constitutional: Alert and oriented. Well appearing and in no acute distress. Eyes: Conjunctivae are normal.  Head: Atraumatic. Nose: No congestion/rhinnorhea. Mouth/Throat: Mucous membranes are moist.  Neck: No stridor.   Cardiovascular: Normal rate, regular rhythm. Grossly normal heart sounds.   Respiratory: Normal respiratory effort.  No retractions. Lungs CTAB. Gastrointestinal: Soft and nontender. No distention.  Musculoskeletal: Bilateral lower extremity edema which is worse to the left lower extremity. Bilateral dorsalis pedis pulses are present and equal. Edema on the left is moderate and mild on the right and to the feet and ankles extending up to the mid calf bilaterally. Patient without any induration, tenderness, warmth or pus noted to the  bilateral lower extremities. No swelling to the bilateral thighs and without any tenderness or ropelike structures to the posterior bilateral thighs. Patient with 5 out of 5 strength bilaterally and sensation is intact to light touch. Genitourinary:  Patient is uncircumcised with an easily retractable foreskin without any irritation of the glans. No redness, no buildup under the foreskin. Discharge. No tenderness or swelling. Neurologic:  Normal speech and language. No gross focal neurologic deficits are appreciated. Skin:  Skin is warm, dry and intact. No rash noted. Psychiatric: Mood and affect are normal. Speech and behavior are normal.  ____________________________________________   LABS (all labs ordered are listed, but only abnormal results are displayed)  Labs Reviewed  COMPREHENSIVE METABOLIC PANEL - Abnormal; Notable for the following:       Result Value   Glucose, Bld 110 (*)    GFR calc non Af Amer 55 (*)    Anion gap 3 (*)    All other components within normal limits  CBC WITH DIFFERENTIAL/PLATELET - Abnormal; Notable for the following:    RBC 3.72 (*)    Hemoglobin 12.3 (*)    HCT 36.8 (*)    All other components within normal limits  URINALYSIS, COMPLETE (UACMP) WITH MICROSCOPIC - Abnormal; Notable for the following:    Color, Urine YELLOW (*)    APPearance CLEAR (*)    Hgb urine dipstick MODERATE (*)    Protein, ur 30 (*)    Leukocytes, UA LARGE (*)    Squamous Epithelial / LPF 0-5 (*)    All other components within normal limits  PROTIME-INR - Abnormal; Notable for the following:    Prothrombin Time 21.6 (*)    All other components within normal limits  URINE CULTURE  LACTIC ACID, PLASMA  CK   ____________________________________________  EKG   ____________________________________________  RADIOLOGY  No evidence of DVT on the lower summary ultrasounds. There is no acute cardio pulmonary finding on the chest  x-ray. ____________________________________________   PROCEDURES  Procedure(s) performed:   Procedures  Critical Care performed:   ____________________________________________   INITIAL IMPRESSION / ASSESSMENT AND PLAN / ED COURSE  Pertinent labs & imaging results that were available during my care of the patient were reviewed by me and considered in my medical decision making (see chart for details).  Patient with his most recent urine culture that was taken 2 days ago which is not showing any growth  despite his urinalysis which appears to be infected.    ----------------------------------------- 5:50 PM on 11/06/2016 ----------------------------------------- I advised the patient to restart his torsemide as his lower extremity pain may be related to fluid congestion since being off of this medication. It also may be related to musculoskeletal pain. However, there was no DVT found on the or sound and the patient appears to have good arterial flow to the bilateral lower extremities. Furthermore, his labs are reassuring with a normal white blood cell count. He is not tachycardic and he does not appear toxic. Unlikely to be septic. We will change his cefuroxime to cefdinir. A repeat urine culture was sent. He has a follow point with his primary care doctor this Monday. He is understanding to return to the emergency department really for any worsening concerning symptoms including fever or chills. He will also be following up with his primary care doctor regarding his subtherapeutic INR. Patient understanding the plan and willing to comply.   ____________________________________________   FINAL CLINICAL IMPRESSION(S) / ED DIAGNOSES  Peripheral edema. Lower extremity pain. Urinary tract infection.    NEW MEDICATIONS STARTED DURING THIS VISIT:  New Prescriptions   No medications on file     Note:  This document was prepared using Dragon voice recognition software and may  include unintentional dictation errors.     Orbie Pyo, MD 11/06/16 [phone]

## 2016-11-06 NOTE — Telephone Encounter (Signed)
Per chart review tab pt is at [facility] ED. 

## 2016-11-06 NOTE — Telephone Encounter (Signed)
Patient Name: [name] [name] DOB: 11-Jun-1922 Initial Comment Caller states he has issues with pain in legs, can hardly walk, seen earlier this week by Urologist, given medication, if swelling and pain not going away to be seen by PCP. Appt Monday at 10:15am, and sent to speak with a nurse. Above ankle to mid thigh on right leg, sometimes left. Nurse Assessment Nurse: Cherie Dark RN, Jarrett Soho Date/Time (Eastern Time): 11/06/2016 11:27:16 AM Confirm and document reason for call. If symptomatic, describe symptoms. ---Caller states he is having some issues with his legs, pain and swelling. Right leg is worse than left. pain is an 8 or 9 and swelling is between the feet and the knee and pain goes the whole leg. Feels like his legs might buckle. Saw a urologist earlier this week and he got cefuroxime from them and was told if not better to f/u with PCP. Has an appt on Monday at 10:15. Does the patient have any new or worsening symptoms? ---Yes Will a triage be completed? ---Yes Related visit to physician within the last 2 weeks? ---Yes Does the PT have any chronic conditions? (i.e. diabetes, asthma, etc.) ---Yes List chronic conditions. ---has a pacemaker, takes Warfarin, Is this a behavioral health or substance abuse call? ---No Guidelines Guideline Title Affirmed Question Affirmed Notes Leg Swelling and Edema [1] Thigh, calf, or ankle swelling AND [2] bilateral AND [3] 1 side is more swollen Final Disposition User See Physician within 4 Hours (or PCP triage) Cherie Dark, RN, Jarrett Soho Comments No appt available with PCP or at primary office. he did want to go to another office. Referrals [facility] - ED Disagree/Comply: Comply

## 2016-11-06 NOTE — ED Triage Notes (Addendum)
Current UTI this week and being treated with antibiotics  (Cefuroxime 500mg  BID).  At that time, patient also c/o right leg pain.  Was told if leg pain did improve, but pain persists.  Pain worse with weight bearing.   Also reports no improvement of urinary symptoms.  Started antibiotics on Wednesday evening.

## 2016-11-06 NOTE — ED Provider Notes (Deleted)
-----------------------------------------   4:26 PM on 11/06/2016 -----------------------------------------   Blood pressure (!) 145/84, pulse (!) 59, temperature 97.8 F (36.6 C), temperature source Oral, resp. rate 16, height 5\' 9"  (1.753 m), weight 64.9 kg (143 lb), SpO2 95 %.  The patient had no acute events since last update.  Calm and cooperative at this time.  Patient to be dispositioned to group home.    Orbie Pyo, MD 11/06/16 [phone]

## 2016-11-08 LAB — URINE CULTURE: Culture: NO GROWTH

## 2016-11-09 ENCOUNTER — Encounter: Payer: Self-pay | Admitting: Family Medicine

## 2016-11-09 ENCOUNTER — Ambulatory Visit (INDEPENDENT_AMBULATORY_CARE_PROVIDER_SITE_OTHER): Payer: Medicare Other | Admitting: Family Medicine

## 2016-11-09 ENCOUNTER — Telehealth: Payer: Self-pay

## 2016-11-09 ENCOUNTER — Telehealth: Payer: Self-pay | Admitting: Internal Medicine

## 2016-11-09 VITALS — BP 118/76 | HR 77 | Temp 97.7°F | Resp 16 | Ht 69.0 in | Wt 140.0 lb

## 2016-11-09 DIAGNOSIS — R7989 Other specified abnormal findings of blood chemistry: Secondary | ICD-10-CM

## 2016-11-09 DIAGNOSIS — M79605 Pain in left leg: Secondary | ICD-10-CM | POA: Diagnosis not present

## 2016-11-09 DIAGNOSIS — M79604 Pain in right leg: Secondary | ICD-10-CM | POA: Diagnosis not present

## 2016-11-09 DIAGNOSIS — R600 Localized edema: Secondary | ICD-10-CM | POA: Diagnosis not present

## 2016-11-09 NOTE — Telephone Encounter (Signed)
lmtcb for lab results

## 2016-11-09 NOTE — Telephone Encounter (Signed)
F/u message ° °Pt returning call about results. Please call back to discuss °

## 2016-11-09 NOTE — Progress Notes (Signed)
Dr. Frederico Hamman T. Coumba Kellison, MD, [facility] [place], [address] Phone: [phone] Fax: [phone]  11/09/2016  Patient: [name] [name], MRN: [id number], DOB: 1922/07/16, 83 y.o.  Primary Physician:  Owens Loffler, MD   Chief Complaint  Patient presents with  . Acute Visit    Pain in Both Legs   Subjective:   [name] [name] is a 83 y.o. very pleasant male patient who presents with the following:  B leg pain, some pain bilaterally.   Seen in ER 11/06/2016.  He was taken there by son after he had had some severe leg pain and alteration of his gait. He did stop taking his torsemide, and got a great deal of lower extremity edema.  Since then, his edema has improved, though he does still have some in the feet and lower aspects of the legs.  Past Medical History, Surgical History, Social History, Family History, Problem List, Medications, and Allergies have been reviewed and updated if relevant.  Patient Active Problem List   Diagnosis Date Noted  . PACEMAKER, PERMANENT 08/07/2008    Priority: Medium  . PAROXYSMAL ATRIAL FIBRILLATION 02/06/2008    Priority: Medium  . DDD (degenerative disc disease), cervical, Severe 06/09/2013  . Neck arthritis, Severe, multi-level 06/09/2013  . Sinoatrial node dysfunction ([name])   . Long term (current) use of anticoagulants 06/24/2010  . IRON DEFICIENCY 04/17/2010  . GLAUCOMA 08/07/2008  . GERD 08/07/2008  . Vitamin B 12 deficiency 07/19/2008  . PEPTIC ULCER DISEASE 07/18/2008  . RADIATION PROCTITIS 07/18/2008  . DIVERTICULOSIS, COLON 07/18/2008  . Prostate cancer, in remission 07/18/2008  . Essential hypertension 05/09/2008  . Hyperlipidemia 02/06/2008  . Hypothyroidism 12/01/2007  . HERN UNS SITE ABD CAV W/O MENTION OBST/GANGREN 12/01/2007    Past Medical History:  Diagnosis Date  . Cardiac pacemaker in situ 07/2005   symptomatic bradycardia  . Diverticulosis of  colon (without mention of hemorrhage)   . 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 GERD (gastroesophageal reflux disease)   . GI hemorrhage   . Hyperlipidemia   . Hypertension   . Neck arthritis, Severe, multi-level 06/09/2013  . Paroxysmal atrial fibrillation ([name])    s/p failed ablation  . Peptic ulcer, unspecified site, unspecified as acute or chronic, without mention of hemorrhage, perforation, or obstruction 1975  . Personal history of malignant neoplasm of prostate 12/2000   5 weeks radiation, radiation seeds  . Radiation proctitis   . Sinoatrial node dysfunction ([name])   . Unspecified glaucoma(365.9)   . Unspecified hypothyroidism   . Vitamin B12 deficiency     Past Surgical History:  Procedure Laterality Date  . CARDIAC CATHETERIZATION    . CARDIAC ELECTROPHYSIOLOGY [place] AND ABLATION  2001, 2003   failure  . CATARACT EXTRACTION    . EP IMPLANTABLE DEVICE N/A 02/24/2016   Procedure: PPM Generator Changeout;  Surgeon: Deboraha Sprang, MD;  Location: [place] [facility];  Service: Cardiovascular;  Laterality: N/A;  . [place], 2001, 2003   s/p drainage and complications, 07/9161, 10/4663 mesh removal  . INSERT / REPLACE / REMOVE PACEMAKER  07/2005   symptomatic bradycardia  . NOSE SURGERY     cancer removed   . TOOTH EXTRACTION      Social History   Social History  . Marital status: Widowed    Spouse name: N/A  .  Number of children: N/A  . Years of education: N/A   Occupational History  . Not on file.   Social History Main Topics  . Smoking status: Never Smoker  . Smokeless tobacco: Never Used  . Alcohol use No  . Drug use: No  . Sexual activity: Not on file   Other Topics Concern  . Not on file   Social History Narrative  . No narrative on file    Family History  Problem Relation Age of Onset  . Breast cancer Mother   . Bone cancer Father   . Alcohol abuse Unknown   .  Coronary artery disease Unknown   . Dementia Unknown   . Diabetes Unknown     Allergies  Allergen Reactions  . Penicillins Rash    Has patient had a PCN reaction causing immediate rash, facial/tongue/throat swelling, SOB or lightheadedness with hypotension: {no Has patient had a PCN reaction causing severe rash involving mucus membranes or skin necrosis: {no Has patient had a PCN reaction that required hospitalization {no Has patient had a PCN reaction occurring within the last 10 years: {no If all of the above answers are "NO", then may proceed with Cephalosporin use.    Medication list reviewed and updated in full in [place].   GEN: No acute illnesses, no fevers, chills. GI: No n/v/d, eating normally Pulm: No SOB Interactive and getting along well at home.  Otherwise, ROS is as per the HPI.  Objective:   BP 118/76   Pulse 77   Temp 97.7 F (36.5 C)   Resp 16   Ht 5\' 9"  (1.753 m)   Wt 140 lb (63.5 kg)   SpO2 99%   BMI 20.67 kg/m   GEN: WDWN, NAD, Non-toxic, A & O x 3 HEENT: Atraumatic, Normocephalic. Neck supple. No masses, No LAD. Ears and Nose: No external deformity. CV: RRR, No M/G/R. No JVD. No thrill. No extra heart sounds. PULM: CTA B, no wheezes, crackles, rhonchi. No retractions. No resp. distress. No accessory muscle use. EXTR: No c/c/ 1+ LE edema, L LE and R foot NEURO Normal gait.  PSYCH: Normally interactive. Conversant. Not depressed or anxious appearing.  Calm demeanor.   Laboratory and Imaging Data:  Assessment and Plan:   Bilateral leg pain  Bilateral leg edema  >25 minutes spent in face to face time with patient, >50% spent in counselling or coordination of care  Improving leg pain and improving edema. Continue torsemide daily for the next 3 days, then go back to every other day.  Additional conversation with the patient and his son regarding his ability to [place] [employer] and some gait instability.  Follow-up: No Follow-up on  file.  Future Appointments Date Time Provider [place]  11/25/2016 9:45 AM [facility] COUMADIN [facility]  12/08/2016 11:00 AM [facility] NURSE [facility]  01/25/2017 9:10 AM [facility] DEVICE REMOTES [facility]   Signed,  Frederico Hamman T. Zachry Hopfensperger, MD   Allergies as of 11/09/2016      Reactions   Penicillins Rash   Has patient had a PCN reaction causing immediate rash, facial/tongue/throat swelling, SOB or lightheadedness with hypotension: {no Has patient had a PCN reaction causing severe rash involving mucus membranes or skin necrosis: {no Has patient had a PCN reaction that required hospitalization {no Has patient had a PCN reaction occurring within the last 10 years: {no If all of the above answers are "NO", then may proceed with Cephalosporin use.      Medication  List       Accurate as of 11/09/16  1:25 PM. Always use your most recent med list.          alendronate 70 MG tablet Commonly known as:  FOSAMAX Take 1 tablet (70 mg total) by mouth every 7 (seven) days. Take with a full glass of water on an empty stomach.   amiodarone 200 MG tablet Commonly known as:  PACERONE Take 0.5 tablets (100 mg total) by mouth daily.   BENEFIBER DRINK MIX PO Take by mouth as directed.   cefdinir 300 MG capsule Commonly known as:  OMNICEF Take 1 capsule (300 mg total) by mouth 2 (two) times daily.   clindamycin 300 MG capsule Commonly known as:  CLEOCIN Take 600 mg by mouth daily as needed. 2 tablets by mouth 1 hour prior to dental procedure   dorzolamide-timolol 22.3-6.8 MG/ML ophthalmic solution Commonly known as:  COSOPT Place 1 drop into both eyes 2 (two) times daily.   ferrous sulfate 325 (65 FE) MG EC tablet Take 325 mg by mouth daily with breakfast.   fluocinonide ointment 0.05 % Commonly known as:  LIDEX Apply 1 application topically 2 (two) times daily.   levothyroxine 100 MCG tablet Commonly known as:  SYNTHROID,  LEVOTHROID Take 1 tablet (100 mcg total) by mouth daily before breakfast.   multivitamin tablet Take 1 tablet by mouth daily.   pantoprazole 40 MG tablet Commonly known as:  PROTONIX Take 1 tablet (40 mg total) by mouth daily.   pravastatin 40 MG tablet Commonly known as:  PRAVACHOL Take 0.5 tablets (20 mg total) by mouth daily.   torsemide 20 MG tablet Commonly known as:  DEMADEX Take 1 tablet (20 mg total) by mouth daily as needed.   VITAMIN B-12 IJ Inject 1,000 mcg/mL as directed every 30 (thirty) days.   warfarin 4 MG tablet Commonly known as:  COUMADIN Take as directed by coumadin clinic

## 2016-11-10 NOTE — Telephone Encounter (Signed)
I spoke with the patient.  He is aware of his TSH and Dr. Olin Pia recommendations for no changes now, but to recheck in 3 months. I advised him we will call him closer to time to arrange. Order already placed in EPIC.

## 2016-11-10 NOTE — Telephone Encounter (Signed)
Follow up   Pt is returning call    

## 2016-11-17 LAB — CUP PACEART REMOTE DEVICE CHECK
Battery Remaining Longevity: 111 mo
Battery Voltage: 3.02 V
Brady Statistic AS VP Percent: 1.27 %
Brady Statistic RA Percent Paced: 90.32 %
Date Time Interrogation Session: 20180725163223
Implantable Lead Implant Date: 20070525
Implantable Lead Location: 753860
Implantable Lead Model: 5076
Implantable Lead Model: 5076
Lead Channel Impedance Value: 361 Ohm
Lead Channel Pacing Threshold Amplitude: 0.75 V
Lead Channel Pacing Threshold Pulse Width: 0.4 ms
Lead Channel Pacing Threshold Pulse Width: 0.4 ms
Lead Channel Sensing Intrinsic Amplitude: 2 mV
Lead Channel Sensing Intrinsic Amplitude: 2 mV
Lead Channel Setting Pacing Pulse Width: 0.4 ms
MDC IDC LEAD IMPLANT DT: 20070525
MDC IDC LEAD LOCATION: 753859
MDC IDC MSMT LEADCHNL RA IMPEDANCE VALUE: 399 Ohm
MDC IDC MSMT LEADCHNL RV IMPEDANCE VALUE: 361 Ohm
MDC IDC MSMT LEADCHNL RV IMPEDANCE VALUE: 437 Ohm
MDC IDC MSMT LEADCHNL RV PACING THRESHOLD AMPLITUDE: 0.875 V
MDC IDC MSMT LEADCHNL RV SENSING INTR AMPL: 1.875 mV
MDC IDC MSMT LEADCHNL RV SENSING INTR AMPL: 1.875 mV
MDC IDC PG IMPLANT DT: 20171127
MDC IDC SET LEADCHNL RA PACING AMPLITUDE: 2 V
MDC IDC SET LEADCHNL RV PACING AMPLITUDE: 2.5 V
MDC IDC SET LEADCHNL RV SENSING SENSITIVITY: 0.6 mV
MDC IDC STAT BRADY AP VP PERCENT: 0.34 %
MDC IDC STAT BRADY AP VS PERCENT: 91.4 %
MDC IDC STAT BRADY AS VS PERCENT: 7 %
MDC IDC STAT BRADY RV PERCENT PACED: 1.66 %

## 2016-11-25 ENCOUNTER — Ambulatory Visit (INDEPENDENT_AMBULATORY_CARE_PROVIDER_SITE_OTHER): Payer: Medicare Other

## 2016-11-25 DIAGNOSIS — I480 Paroxysmal atrial fibrillation: Secondary | ICD-10-CM

## 2016-11-25 LAB — POCT INR: INR: 1.7

## 2016-12-08 ENCOUNTER — Ambulatory Visit: Payer: Medicare Other

## 2016-12-09 ENCOUNTER — Telehealth: Payer: Self-pay

## 2016-12-09 ENCOUNTER — Encounter: Payer: Self-pay | Admitting: Family Medicine

## 2016-12-09 ENCOUNTER — Ambulatory Visit (INDEPENDENT_AMBULATORY_CARE_PROVIDER_SITE_OTHER): Payer: Medicare Other | Admitting: Family Medicine

## 2016-12-09 VITALS — BP 100/70 | HR 89 | Temp 97.4°F | Ht 69.0 in | Wt 133.5 lb

## 2016-12-09 DIAGNOSIS — C61 Malignant neoplasm of prostate: Secondary | ICD-10-CM

## 2016-12-09 DIAGNOSIS — R300 Dysuria: Secondary | ICD-10-CM

## 2016-12-09 DIAGNOSIS — R634 Abnormal weight loss: Secondary | ICD-10-CM

## 2016-12-09 DIAGNOSIS — E538 Deficiency of other specified B group vitamins: Secondary | ICD-10-CM | POA: Diagnosis not present

## 2016-12-09 LAB — POC URINALSYSI DIPSTICK (AUTOMATED)
BILIRUBIN UA: NEGATIVE
Glucose, UA: NEGATIVE
KETONES UA: NEGATIVE
Nitrite, UA: NEGATIVE
PH UA: 6 (ref 5.0–8.0)
PROTEIN UA: NEGATIVE
SPEC GRAV UA: 1.025 (ref 1.010–1.025)
Urobilinogen, UA: 0.2 E.U./dL

## 2016-12-09 LAB — BASIC METABOLIC PANEL
BUN: 37 mg/dL — AB (ref 6–23)
CALCIUM: 9.8 mg/dL (ref 8.4–10.5)
CHLORIDE: 105 meq/L (ref 96–112)
CO2: 25 mEq/L (ref 19–32)
CREATININE: 1.63 mg/dL — AB (ref 0.40–1.50)
GFR: 42.29 mL/min — AB (ref >60.00)
Glucose, Bld: 111 mg/dL — ABNORMAL HIGH (ref 70–99)
Potassium: 4.2 mEq/L (ref 3.5–5.1)
Sodium: 139 mEq/L (ref 135–145)

## 2016-12-09 MED ORDER — CYANOCOBALAMIN 1000 MCG/ML IJ SOLN
1000.0000 ug | Freq: Once | INTRAMUSCULAR | Status: AC
  Administered 2016-12-09: 1000 ug via INTRAMUSCULAR

## 2016-12-09 NOTE — Patient Instructions (Signed)

## 2016-12-09 NOTE — Telephone Encounter (Signed)
Kylie with [facility] left v/m; pt scheduled 12/10/16 for CT Abd/pelvis with contrast and  BC requires precert. Kylie request cb.

## 2016-12-09 NOTE — Progress Notes (Signed)
Dr. Frederico Hamman T. Mohammad Granade, MD, [facility] [place], [address] Phone: [phone] Fax: [phone]  12/09/2016  Patient: [name] [name], MRN: [id number], DOB: 10-27-1922, 83 y.o.  Primary Physician:  Owens Loffler, MD   Chief Complaint  Patient presents with  . Discuss Weight Loss  . Burning with Urination   Subjective:   [name] [name] is a 83 y.o. very pleasant male patient who presents with the following:  Wt Readings from Last 3 Encounters:  12/09/16 133 lb 8 oz (60.6 kg)  11/09/16 140 lb (63.5 kg)  11/06/16 143 lb (64.9 kg)    150 3 years ago  Does not eat a whole lot - big meal will send him to the bathroom. Taking Torsemide at night - only in very small quantities.   He does have a history of prostate cancer, but this is routinely followed by urology. He otherwise is been asymptomatic and had a perfectly normal recent chest x-ray one month ago.  Even up to 4 months ago the patient weighed about 150 pounds, and the only real changes his increased use of torsemide which is now at least once every other day. He continues to eat and go out about twice a day.  Stoioff gave ceph for UTI.  Past Medical History, Surgical History, Social History, Family History, Problem List, Medications, and Allergies have been reviewed and updated if relevant.  Patient Active Problem List   Diagnosis Date Noted  . PACEMAKER, PERMANENT 08/07/2008    Priority: Medium  . PAROXYSMAL ATRIAL FIBRILLATION 02/06/2008    Priority: Medium  . DDD (degenerative disc disease), cervical, Severe 06/09/2013  . Neck arthritis, Severe, multi-level 06/09/2013  . Sinoatrial node dysfunction ([name])   . Long term (current) use of anticoagulants 06/24/2010  . IRON DEFICIENCY 04/17/2010  . GLAUCOMA 08/07/2008  . GERD 08/07/2008  . Vitamin B 12 deficiency 07/19/2008  . PEPTIC ULCER DISEASE 07/18/2008  . RADIATION PROCTITIS 07/18/2008  .  DIVERTICULOSIS, COLON 07/18/2008  . Prostate cancer, in remission 07/18/2008  . Essential hypertension 05/09/2008  . Hyperlipidemia 02/06/2008  . Hypothyroidism 12/01/2007  . HERN UNS SITE ABD CAV W/O MENTION OBST/GANGREN 12/01/2007    Past Medical History:  Diagnosis Date  . Cardiac pacemaker in situ 07/2005   symptomatic bradycardia  . Diverticulosis of colon (without mention of hemorrhage)   . 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 GERD (gastroesophageal reflux disease)   . GI hemorrhage   . Hyperlipidemia   . Hypertension   . Neck arthritis, Severe, multi-level 06/09/2013  . Paroxysmal atrial fibrillation ([name])    s/p failed ablation  . Peptic ulcer, unspecified site, unspecified as acute or chronic, without mention of hemorrhage, perforation, or obstruction 1975  . Personal history of malignant neoplasm of prostate 12/2000   5 weeks radiation, radiation seeds  . Radiation proctitis   . Sinoatrial node dysfunction ([name])   . Unspecified glaucoma(365.9)   . Unspecified hypothyroidism   . Vitamin B12 deficiency     Past Surgical History:  Procedure Laterality Date  . CARDIAC CATHETERIZATION    . CARDIAC ELECTROPHYSIOLOGY [place] AND ABLATION  2001, 2003   failure  . CATARACT EXTRACTION    . EP IMPLANTABLE DEVICE N/A 02/24/2016   Procedure: PPM Generator Changeout;  Surgeon: Deboraha Sprang, MD;  Location: [facility];  Service: Cardiovascular;  Laterality: N/A;  .  HERNIA REPAIR  1994, 2001, 2003   s/p drainage and complications, 05/8411, 05/4399 mesh removal  . INSERT / REPLACE / REMOVE PACEMAKER  07/2005   symptomatic bradycardia  . NOSE SURGERY     cancer removed   . TOOTH EXTRACTION      Social History   Social History  . Marital status: Widowed    Spouse name: N/A  . Number of children: N/A  . Years of education: N/A   Occupational History  . Not on file.   Social History Main Topics   . Smoking status: Never Smoker  . Smokeless tobacco: Never Used  . Alcohol use No  . Drug use: No  . Sexual activity: Not on file   Other Topics Concern  . Not on file   Social History Narrative  . No narrative on file    Family History  Problem Relation Age of Onset  . Breast cancer Mother   . Bone cancer Father   . Alcohol abuse Unknown   . Coronary artery disease Unknown   . Dementia Unknown   . Diabetes Unknown     Allergies  Allergen Reactions  . Penicillins Rash    Has patient had a PCN reaction causing immediate rash, facial/tongue/throat swelling, SOB or lightheadedness with hypotension: {no Has patient had a PCN reaction causing severe rash involving mucus membranes or skin necrosis: {no Has patient had a PCN reaction that required hospitalization {no Has patient had a PCN reaction occurring within the last 10 years: {no If all of the above answers are "NO", then may proceed with Cephalosporin use.    Medication list reviewed and updated in full in [place] [facility].   GEN: No acute illnesses, no fevers, chills. GI: No n/v/d, eating normally Pulm: No SOB Interactive and getting along well at home.  Otherwise, ROS is as per the HPI.  Objective:   BP 100/70   Pulse 89   Temp (!) 97.4 F (36.3 C) (Oral)   Ht 5\' 9"  (1.753 m)   Wt 133 lb 8 oz (60.6 kg)   BMI 19.71 kg/m   GEN: WDWN, NAD, Non-toxic, A & O x 3 HEENT: Atraumatic, Normocephalic. Neck supple. No masses, No LAD. Ears and Nose: No external deformity. CV: RRR, No M/G/R. No JVD. No thrill. No extra heart sounds. PULM: CTA B, no wheezes, crackles, rhonchi. No retractions. No resp. distress. No accessory muscle use. EXTR: No c/c/e NEURO Normal gait.  PSYCH: Normally interactive. Conversant. Not depressed or anxious appearing.  Calm demeanor.   Laboratory and Imaging Data: Results for orders placed or performed in visit on 12/09/16  POCT Urinalysis Dipstick (Automated)  Result Value Ref Range    Color, UA yellow    Clarity, UA clear    Glucose, UA negative    Bilirubin, UA negative    Ketones, UA negative    Spec Grav, UA 1.025 1.010 - 1.025   Blood, UA moderate (2+)    pH, UA 6.0 5.0 - 8.0   Protein, UA negative    Urobilinogen, UA 0.2 0.2 or 1.0 E.U./dL   Nitrite, UA negative    Leukocytes, UA Large (3+) (A) Negative      Chemistry      Component Value Date/Time   NA 141 11/06/2016 1240   K 3.7 11/06/2016 1240   CL 110 11/06/2016 1240   CO2 28 11/06/2016 1240   BUN 17 11/06/2016 1240   CREATININE 1.12 11/06/2016 1240      Component  Value Date/Time   CALCIUM 9.1 11/06/2016 1240   ALKPHOS 84 11/06/2016 1240   AST 31 11/06/2016 1240   ALT 18 11/06/2016 1240   BILITOT 1.1 11/06/2016 1240   BILITOT 0.8 07/21/2016 1056      Lab Results  Component Value Date   WBC 4.5 11/06/2016   HGB 12.3 (L) 11/06/2016   HCT 36.8 (L) 11/06/2016   MCV 99.0 11/06/2016   PLT 197 11/06/2016    Lab Results  Component Value Date   TSH 0.437 (L) 10/28/2016   T4TOTAL 8.4 09/10/2008    Dg Chest 2 View  Result Date: 11/06/2016 CLINICAL DATA:  Bilateral leg pain. EXAM: CHEST  2 VIEW COMPARISON:  Chest CT 04/19/2012 FINDINGS: The cardiac silhouette, mediastinal and hilar contours are within normal limits and stable. There is mild tortuosity and calcification of the thoracic aorta. Pacer wires are in good position, unchanged. The lungs demonstrate hyperinflation and emphysematous changes but no infiltrates, edema or effusions. No pneumothorax. No worrisome lesions. The bony thorax is intact. IMPRESSION: No acute cardiopulmonary findings. Electronically Signed   By: Marijo Sanes M.D.   On: 11/06/2016 13:08   US Venous Img Lower Bilateral  Result Date: 11/06/2016 CLINICAL DATA:  Bilateral lower extremity pain and swelling. EXAM: BILATERAL LOWER EXTREMITY VENOUS DOPPLER ULTRASOUND TECHNIQUE: Gray-scale sonography with graded compression, as well as color Doppler and duplex ultrasound were  performed to evaluate the lower extremity deep venous systems from the level of the common femoral vein and including the common femoral, femoral, profunda femoral, popliteal and calf veins including the posterior tibial, peroneal and gastrocnemius veins when visible. The superficial great saphenous vein was also interrogated. Spectral Doppler was utilized to evaluate flow at rest and with distal augmentation maneuvers in the common femoral, femoral and popliteal veins. COMPARISON:  None. FINDINGS: RIGHT LOWER EXTREMITY Common Femoral Vein: No evidence of thrombus. Normal compressibility, respiratory phasicity and response to augmentation. Saphenofemoral Junction: No evidence of thrombus. Normal compressibility and flow on color Doppler imaging. Profunda Femoral Vein: No evidence of thrombus. Normal compressibility and flow on color Doppler imaging. Femoral Vein: No evidence of thrombus. Normal compressibility, respiratory phasicity and response to augmentation. Popliteal Vein: No evidence of thrombus. Normal compressibility, respiratory phasicity and response to augmentation. Calf Veins: No evidence of thrombus. Normal compressibility and flow on color Doppler imaging. Superficial Great Saphenous Vein: No evidence of thrombus. Normal compressibility and flow on color Doppler imaging. Venous Reflux:  None. Other Findings:  None. LEFT LOWER EXTREMITY Common Femoral Vein: No evidence of thrombus. Normal compressibility, respiratory phasicity and response to augmentation. Saphenofemoral Junction: No evidence of thrombus. Normal compressibility and flow on color Doppler imaging. Profunda Femoral Vein: No evidence of thrombus. Normal compressibility and flow on color Doppler imaging. Femoral Vein: No evidence of thrombus. Normal compressibility, respiratory phasicity and response to augmentation. Popliteal Vein: No evidence of thrombus. Normal compressibility, respiratory phasicity and response to augmentation. Calf  Veins: No evidence of thrombus. Normal compressibility and flow on color Doppler imaging. Superficial Great Saphenous Vein: No evidence of thrombus. Normal compressibility and flow on color Doppler imaging. Venous Reflux:  None. Other Findings: There is a 2.8 x 1.1 x 2.6 cm anechoic fluid collection in the left popliteal fossa, possibly a Baker cyst. IMPRESSION: No evidence of DVT within either lower extremity. Electronically Signed   By: Titus Dubin M.D.   On: 11/06/2016 17:16   Assessment and Plan:   Weight loss, non-intentional - Plan: CT Abdomen Pelvis W Contrast  Abnormal  weight loss - Plan: CT Abdomen Pelvis W Contrast, Basic metabolic panel  Burning with urination - Plan: POCT Urinalysis Dipstick (Automated), Urine Culture  Vitamin B12 deficiency - Plan: cyanocobalamin ((VITAMIN B-12)) injection 1,000 mcg  Prostate cancer, in remission  We will also 83 year old is often multifactorial. In this case, the one area that has not been evaluated recently as the patient's abdominal pelvis. Given this much weight loss up again is reasonable to image his abdomen and pelvis. Obtain a CT the abdomen and pelvis with contrast to evaluate for potential neoplastic disease or other intra-abdominal process.  Chest x-ray as above normal. No recurrence of prostate cancer.  He is having some urgency now, and his urine is suggestive of possible infection, with recent treatment by antibiotics. I'm going to obtain a culture for closer spectrum.  Follow-up: No Follow-up on file.  Future Appointments Date Time Provider [place]  12/10/2016 9:30 AM [facility]  12/14/2016 10:00 AM [facility]  01/25/2017 9:10 AM [facility] DEVICE REMOTES [facility]    Meds ordered this encounter  Medications  . cyanocobalamin ((VITAMIN B-12)) injection 1,000 mcg   Medications Discontinued During This Encounter  Medication Reason  . fluocinonide  ointment (LIDEX) 0.05 % Completed Course  . cefdinir (OMNICEF) 300 MG capsule Completed Course   Orders Placed This Encounter  Procedures  . Urine Culture  . CT Abdomen Pelvis W Contrast  . Basic metabolic panel  . POCT Urinalysis Dipstick (Automated)    Signed,  Jordynn Marcella T. Iridian Reader, MD   Patient's Medications  New Prescriptions   No medications on file  Previous Medications   ALENDRONATE (FOSAMAX) 70 MG TABLET    Take 1 tablet (70 mg total) by mouth every 7 (seven) days. Take with a full glass of water on an empty stomach.   AMIODARONE (PACERONE) 200 MG TABLET    Take 0.5 tablets (100 mg total) by mouth daily.   CLINDAMYCIN (CLEOCIN) 300 MG CAPSULE    Take 600 mg by mouth daily as needed. 2 tablets by mouth 1 hour prior to dental procedure    CYANOCOBALAMIN (VITAMIN B-12 IJ)    Inject 1,000 mcg/mL as directed every 30 (thirty) days.     DORZOLAMIDE-TIMOLOL (COSOPT) 22.3-6.8 MG/ML OPHTHALMIC SOLUTION    Place 1 drop into both eyes 2 (two) times daily.     FERROUS SULFATE 325 (65 FE) MG EC TABLET    Take 325 mg by mouth daily with breakfast.     LEVOTHYROXINE (SYNTHROID, LEVOTHROID) 100 MCG TABLET    Take 1 tablet (100 mcg total) by mouth daily before breakfast.   MULTIPLE VITAMIN (MULTIVITAMIN) TABLET    Take 1 tablet by mouth daily.     PANTOPRAZOLE (PROTONIX) 40 MG TABLET    Take 1 tablet (40 mg total) by mouth daily.   PRAVASTATIN (PRAVACHOL) 40 MG TABLET    Take 0.5 tablets (20 mg total) by mouth daily.   TORSEMIDE (DEMADEX) 20 MG TABLET    Take 1 tablet (20 mg total) by mouth daily as needed.   WARFARIN (COUMADIN) 4 MG TABLET    Take as directed by coumadin clinic   WHEAT DEXTRIN (BENEFIBER DRINK MIX PO)    Take by mouth as directed.    Modified Medications   No medications on file  Discontinued Medications   CEFDINIR (OMNICEF) 300 MG CAPSULE    Take 1 capsule (300 mg total) by mouth 2 (two) times daily.   FLUOCINONIDE OINTMENT (LIDEX) 0.05 %  Apply 1 application topically  2 (two) times daily.

## 2016-12-10 ENCOUNTER — Ambulatory Visit: Payer: Medicare Other

## 2016-12-10 ENCOUNTER — Ambulatory Visit
Admission: RE | Admit: 2016-12-10 | Discharge: 2016-12-10 | Disposition: A | Discharge status: Home / Self Care | Payer: Medicare Other | Source: Ambulatory Visit | Attending: Family Medicine | Admitting: Family Medicine

## 2016-12-10 DIAGNOSIS — K5730 Diverticulosis of large intestine without perforation or abscess without bleeding: Secondary | ICD-10-CM | POA: Diagnosis not present

## 2016-12-10 DIAGNOSIS — I700 Atherosclerosis of aorta: Secondary | ICD-10-CM | POA: Insufficient documentation

## 2016-12-10 DIAGNOSIS — R634 Abnormal weight loss: Secondary | ICD-10-CM | POA: Insufficient documentation

## 2016-12-10 HISTORY — DX: Malignant (primary) neoplasm, unspecified: C80.1

## 2016-12-10 MED ORDER — IOPAMIDOL (ISOVUE-300) INJECTION 61%
75.0000 mL | Freq: Once | INTRAVENOUS | Status: AC | PRN
  Administered 2016-12-10: 75 mL via INTRAVENOUS

## 2016-12-11 LAB — URINE CULTURE
MICRO NUMBER: 81006533
SPECIMEN QUALITY: ADEQUATE

## 2016-12-14 ENCOUNTER — Telehealth: Payer: Self-pay | Admitting: *Deleted

## 2016-12-14 ENCOUNTER — Ambulatory Visit (INDEPENDENT_AMBULATORY_CARE_PROVIDER_SITE_OTHER): Payer: Medicare Other

## 2016-12-14 DIAGNOSIS — Z5181 Encounter for therapeutic drug level monitoring: Secondary | ICD-10-CM | POA: Diagnosis not present

## 2016-12-14 DIAGNOSIS — I480 Paroxysmal atrial fibrillation: Secondary | ICD-10-CM

## 2016-12-14 LAB — POCT INR: INR: 3.8

## 2016-12-14 MED ORDER — CIPROFLOXACIN HCL 250 MG PO TABS: 250.0000 mg | ORAL_TABLET | Freq: Two times a day (BID) | ORAL | 0 refills | Status: DC

## 2016-12-14 NOTE — Telephone Encounter (Signed)
Pt returned your call.  

## 2016-12-14 NOTE — Telephone Encounter (Signed)
-----   Message from Owens Loffler, MD sent at 12/14/2016  8:15 AM EDT ----- He does have a UTI  Let's send in 1 week of abx  cipro 250 mg, 1 po bid, #14.  It also looks like his kidneys are mildly impaired / dehydrated. I would stop demadex for 2 days, then resume once every third day to try to decrease dehydration

## 2016-12-14 NOTE — Telephone Encounter (Signed)
Left message for [name] [name] to return my call.  ?

## 2016-12-14 NOTE — Telephone Encounter (Signed)
Mr. Hinkle notified as instructed by telephone.  Cipro prescription sent into [facility] [address] in [place] as instructed by Dr. Lorelei Pont.

## 2016-12-28 ENCOUNTER — Ambulatory Visit (INDEPENDENT_AMBULATORY_CARE_PROVIDER_SITE_OTHER): Payer: Medicare Other

## 2016-12-28 DIAGNOSIS — Z5181 Encounter for therapeutic drug level monitoring: Secondary | ICD-10-CM | POA: Diagnosis not present

## 2016-12-28 DIAGNOSIS — I480 Paroxysmal atrial fibrillation: Secondary | ICD-10-CM | POA: Diagnosis not present

## 2016-12-28 LAB — POCT INR: INR: 2.5

## 2017-01-18 ENCOUNTER — Ambulatory Visit (INDEPENDENT_AMBULATORY_CARE_PROVIDER_SITE_OTHER): Payer: Medicare Other

## 2017-01-18 DIAGNOSIS — I480 Paroxysmal atrial fibrillation: Secondary | ICD-10-CM

## 2017-01-18 LAB — POCT INR: INR: 2.2

## 2017-01-19 ENCOUNTER — Ambulatory Visit (INDEPENDENT_AMBULATORY_CARE_PROVIDER_SITE_OTHER): Payer: Medicare Other | Admitting: Urology

## 2017-01-19 ENCOUNTER — Encounter: Payer: Self-pay | Admitting: Urology

## 2017-01-19 VITALS — BP 117/74 | HR 67 | Ht 69.0 in | Wt 143.0 lb

## 2017-01-19 DIAGNOSIS — R339 Retention of urine, unspecified: Secondary | ICD-10-CM

## 2017-01-19 DIAGNOSIS — R300 Dysuria: Secondary | ICD-10-CM

## 2017-01-19 LAB — URINALYSIS, COMPLETE
Bilirubin, UA: NEGATIVE
GLUCOSE, UA: NEGATIVE
Ketones, UA: NEGATIVE
Nitrite, UA: NEGATIVE
Specific Gravity, UA: 1.015 (ref 1.005–1.030)
Urobilinogen, Ur: 0.2 mg/dL (ref 0.2–1.0)
pH, UA: 5.5 (ref 5.0–7.5)

## 2017-01-19 LAB — MICROSCOPIC EXAMINATION
BACTERIA UA: NONE SEEN
Epithelial Cells (non renal): NONE SEEN /hpf (ref 0–10)

## 2017-01-19 LAB — BLADDER SCAN AMB NON-IMAGING

## 2017-01-19 MED ORDER — CEFDINIR 300 MG PO CAPS: 300.0000 mg | ORAL_CAPSULE | Freq: Two times a day (BID) | ORAL | 0 refills | Status: AC

## 2017-01-19 NOTE — Progress Notes (Signed)
01/19/2017 4:27 PM   [name] [name] 31-Aug-1922 [id number]  Referring provider: Owens Loffler, MD [place], [place] [address]  Chief Complaint  Patient presents with  . Dysuria    HPI: 83 year male previously followed by me at [facility] for chronic pyuria.  He was found to have erosion of hernia mesh into the bladder which has apparently been present for several years. It could not be completely removed endoscopically secondary to an anterior location. He had no symptoms prior to diagnosis and was followed conservatively.  In August 2018 he was seen for dysuria and urine culture subsequently was negative.  He was seen in the ER at [facility] a few days later for persistent dysuria and repeat urine culture was again negative.  He saw Dr. Lorelei Pont in mid September for persistent dysuria and urine culture grew Klebsiella oxytocin and he completed an antibiotic course with improvement in his symptoms.  A CT of the abdomen and pelvis was performed on 12/10/2016 which showed no significant GU abnormalities.  On my review there are persistent calcifications in the bladder.  His PVR by bladder scan in August 2018 was 21 mL.  He presently is complaining of dysuria and decreased force and caliber of his urinary stream.  He has intermittent urinary incontinence.  Denies fever, chills or gross hematuria.  Prostate cancer status post radiation/brachytherapy several years ago.  He is on Coumadin for atrial fibrillation.  PMH: Past Medical History:  Diagnosis Date  . Cancer ([place])   . Cardiac pacemaker in situ 07/2005   symptomatic bradycardia  . Diverticulosis of colon (without mention of hemorrhage)   . 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 GERD (gastroesophageal reflux disease)   . GI hemorrhage   . Hyperlipidemia   . Hypertension   . Neck arthritis, Severe, multi-level 06/09/2013  . Paroxysmal atrial fibrillation ([name])     s/p failed ablation  . Peptic ulcer, unspecified site, unspecified as acute or chronic, without mention of hemorrhage, perforation, or obstruction 1975  . Personal history of malignant neoplasm of prostate 12/2000   5 weeks radiation, radiation seeds  . Radiation proctitis   . Sinoatrial node dysfunction ([name])   . Unspecified glaucoma(365.9)   . Unspecified hypothyroidism   . Vitamin B12 deficiency     Surgical History: Past Surgical History:  Procedure Laterality Date  . CARDIAC CATHETERIZATION    . CARDIAC ELECTROPHYSIOLOGY [place] AND ABLATION  2001, 2003   failure  . CATARACT EXTRACTION    . EP IMPLANTABLE DEVICE N/A 02/24/2016   Procedure: PPM Generator Changeout;  Surgeon: Deboraha Sprang, MD;  Location: [facility];  Service: Cardiovascular;  Laterality: N/A;  . [place], 2001, 2003   s/p drainage and complications, 05/7167, 08/7891 mesh removal  . INSERT / REPLACE / REMOVE PACEMAKER  07/2005   symptomatic bradycardia  . NOSE SURGERY     cancer removed   . TOOTH EXTRACTION      Home Medications:  Allergies as of 01/19/2017      Reactions   Penicillins Rash, Other (See Comments)   Has patient had a PCN reaction causing immediate rash, facial/tongue/throat swelling, SOB or lightheadedness with hypotension: {no Has patient had a PCN reaction causing severe rash involving mucus membranes or skin necrosis: {no Has patient had a PCN reaction that required hospitalization {no Has patient had a PCN reaction occurring within  the last 10 years: {no If all of the above answers are "NO", then may proceed with Cephalosporin use.      Medication List       Accurate as of 01/19/17  4:27 PM. Always use your most recent med list.          acetaminophen 500 MG tablet Commonly known as:  TYLENOL Take 500 mg by mouth every 6 (six) hours as needed for moderate pain or headache.   alendronate 70 MG tablet Commonly known as:  FOSAMAX Take 1 tablet (70 mg total)  by mouth every 7 (seven) days. Take with a full glass of water on an empty stomach.   amiodarone 200 MG tablet Commonly known as:  PACERONE Take 0.5 tablets (100 mg total) by mouth daily.   BENEFIBER DRINK MIX PO Take 1 scoop by mouth daily.   clindamycin 300 MG capsule Commonly known as:  CLEOCIN Take 600 mg by mouth See admin instructions. Take 600 mg by mouth 1 hour prior to dental procedure   dorzolamide-timolol 22.3-6.8 MG/ML ophthalmic solution Commonly known as:  COSOPT Place 1 drop into both eyes 2 (two) times daily.   ferrous sulfate 325 (65 FE) MG EC tablet Take 325 mg by mouth daily with breakfast.   levothyroxine 100 MCG tablet Commonly known as:  SYNTHROID, LEVOTHROID Take 1 tablet (100 mcg total) by mouth daily before breakfast.   multivitamin tablet Take 1 tablet by mouth daily.   pantoprazole 40 MG tablet Commonly known as:  PROTONIX Take 1 tablet (40 mg total) by mouth daily.   pravastatin 40 MG tablet Commonly known as:  PRAVACHOL Take 0.5 tablets (20 mg total) by mouth daily.   torsemide 20 MG tablet Commonly known as:  DEMADEX Take 1 tablet (20 mg total) by mouth daily as needed.   VITAMIN B-12 IJ Inject 1,000 mcg as directed every 30 (thirty) days.   warfarin 4 MG tablet Commonly known as:  COUMADIN Take as directed by coumadin clinic       Allergies:  Allergies  Allergen Reactions  . Penicillins Rash and Other (See Comments)    Has patient had a PCN reaction causing immediate rash, facial/tongue/throat swelling, SOB or lightheadedness with hypotension: {no Has patient had a PCN reaction causing severe rash involving mucus membranes or skin necrosis: {no Has patient had a PCN reaction that required hospitalization {no Has patient had a PCN reaction occurring within the last 10 years: {no If all of the above answers are "NO", then may proceed with Cephalosporin use.    Family History: Family History  Problem Relation Age of Onset  .  Breast cancer Mother   . Bone cancer Father   . Alcohol abuse Unknown   . Coronary artery disease Unknown   . Dementia Unknown   . Diabetes Unknown     Social History:  reports that he has never smoked. He has never used smokeless tobacco. He reports that he does not drink alcohol or use drugs.  ROS: UROLOGY Frequent Urination?: Yes Hard to postpone urination?: Yes Burning/pain with urination?: Yes Get up at night to urinate?: Yes Leakage of urine?: Yes Urine stream starts and stops?: No Trouble starting stream?: Yes Do you have to strain to urinate?: Yes Blood in urine?: No Urinary tract infection?: Yes Sexually transmitted disease?: No Injury to kidneys or bladder?: No Painful intercourse?: No Weak stream?: Yes Erection problems?: No Penile pain?: No  Gastrointestinal Nausea?: No Vomiting?: No Indigestion/heartburn?: No Diarrhea?: Yes Constipation?: No  Constitutional Fever: No Night sweats?: No Weight loss?: Yes Fatigue?: Yes  Skin Skin rash/lesions?: No Itching?: No  Eyes Blurred vision?: No Double vision?: No  Ears/Nose/Throat Sore throat?: No Sinus problems?: No  Hematologic/Lymphatic Swollen glands?: No Easy bruising?: Yes  Cardiovascular Leg swelling?: Yes Chest pain?: No  Respiratory Cough?: No Shortness of breath?: No  Endocrine Excessive thirst?: No  Musculoskeletal Back pain?: No Joint pain?: No  Neurological Headaches?: No Dizziness?: No  Psychologic Depression?: No Anxiety?: No  Physical Exam: BP 117/74   Pulse 67   Ht 5\' 9"  (1.753 m)   Wt 143 lb (64.9 kg)   BMI 21.12 kg/m   Constitutional:  Alert and oriented, No acute distress. HEENT: [place] AT, moist mucus membranes.  Trachea midline, no masses. Cardiovascular: No clubbing, cyanosis, or edema.  RRR Respiratory: Normal respiratory effort, no increased work of breathing.  Lungs clear. GI: Abdomen is soft, nontender, nondistended, no abdominal masses GU: No CVA  tenderness.  Penis circumcised without lesions testes descended bilaterally, slightly atrophic without tenderness. Skin: No rashes, bruises or suspicious lesions. Lymph: No cervical or inguinal adenopathy. Neurologic: Grossly intact, no focal deficits, moving all 4 extremities. Psychiatric: Normal mood and affect.  Laboratory Data: Lab Results  Component Value Date   WBC 4.5 11/06/2016   HGB 12.3 (L) 11/06/2016   HCT 36.8 (L) 11/06/2016   MCV 99.0 11/06/2016   PLT 197 11/06/2016    Lab Results  Component Value Date   CREATININE 1.63 (H) 12/09/2016    Lab Results  Component Value Date   HGBA1C 6.2 07/20/2008    Urinalysis Lab Results  Component Value Date   SPECGRAV 1.015 01/19/2017   PHUR 5.5 01/19/2017   COLORU Yellow 01/19/2017   APPEARANCEUR Clear 01/19/2017   LEUKOCYTESUR 2+ (A) 01/19/2017   PROTEINUR Trace (A) 01/19/2017   GLUCOSEU Negative 01/19/2017   KETONESU Negative 01/19/2017   RBCU 3+ (A) 01/19/2017   BILIRUBINUR Negative 01/19/2017   UUROB 0.2 01/19/2017   NITRITE Negative 01/19/2017    Lab Results  Component Value Date   LABMICR See below: 01/19/2017   WBCUA 11-30 (A) 01/19/2017   RBCUA 11-30 (A) 01/19/2017   LABEPIT None seen 01/19/2017   MUCUS Present (A) 01/19/2017   BACTERIA None seen 01/19/2017     Assessment & Plan:  1. Dysuria Urinalysis with microhematuria and pyuria.  A urine culture was ordered.   PVR by bladder scan was 354 mL.  Attempts at Foley catheter placement were unsuccessful with 12 and 16 French catheters most likely secondary to a bulb stricture.  Based on his pyuria and minimal symptoms it was elected not to perform an office dilation.  He will be scheduled for cystoscopy under anesthesia with dilation and possible internal urethrotomy.  Cardiology clearance requested.  He was informed should he develop symptomatic urinary retention to contact the office.  - Urinalysis, Complete - CULTURE, URINE COMPREHENSIVE - BLADDER  SCAN AMB NON-IMAGING  2. Incomplete bladder emptying As above    Abbie Sons, [place] [address], [place], [place] [address] [phone]

## 2017-01-19 NOTE — Progress Notes (Signed)
Simple Catheter Placement  Due to urinary retention patient is present today for a foley cath placement.  Patient was cleaned and prepped in a sterile fashion with betadine and lidocaine jelly 2% was instilled into the urethra.  A 16 FR coude foley catheter was attempted with out sucess, Dr. Bernardo Heater was asked to assist. complications were noted as: Dr. Bernardo Heater unable to pass 16 FR coude cath, a 12 FR was then attempted with out sucess. Per Dr. Bernardo Heater did not was to dialate in office due to infection. Will send UA for culture and patient will be scheduled for Dialation in OR   Preformed by: John Giovanni, MD and Fonnie Jarvis, CMA

## 2017-01-20 ENCOUNTER — Telehealth: Payer: Self-pay

## 2017-01-20 NOTE — Telephone Encounter (Signed)
Patient notified of surgery with Dr Bernardo Heater at [facility] on 01/29/17. He will pre admit at the hospital on 01/22/17 at 11:00 am. We have faxed Dr Olin Pia office for Cardiac clearance prior to surgery. The patient is aware of dates, time, and instructions.

## 2017-01-21 ENCOUNTER — Telehealth: Payer: Self-pay | Admitting: *Deleted

## 2017-01-21 NOTE — Telephone Encounter (Signed)
Clearance received from [facility] for cysto, urethral dilatation, possible internal uretherotomy by Dr. John Giovanni. Dr. Caryl Comes reviewed and signed clearance form. Form faxed to [facility] along with EKG and office visit note.

## 2017-01-22 ENCOUNTER — Encounter
Admission: RE | Admit: 2017-01-22 | Discharge: 2017-01-22 | Disposition: A | Discharge status: Home / Self Care | Payer: Medicare Other | Source: Ambulatory Visit | Attending: Urology | Admitting: Urology

## 2017-01-22 ENCOUNTER — Telehealth: Payer: Self-pay

## 2017-01-22 DIAGNOSIS — Z01810 Encounter for preprocedural cardiovascular examination: Secondary | ICD-10-CM | POA: Diagnosis not present

## 2017-01-22 DIAGNOSIS — I4519 Other right bundle-branch block: Secondary | ICD-10-CM | POA: Diagnosis not present

## 2017-01-22 DIAGNOSIS — Z01812 Encounter for preprocedural laboratory examination: Secondary | ICD-10-CM | POA: Diagnosis not present

## 2017-01-22 DIAGNOSIS — I2510 Atherosclerotic heart disease of native coronary artery without angina pectoris: Secondary | ICD-10-CM | POA: Insufficient documentation

## 2017-01-22 DIAGNOSIS — Z950 Presence of cardiac pacemaker: Secondary | ICD-10-CM | POA: Insufficient documentation

## 2017-01-22 HISTORY — DX: Localized edema: R60.0

## 2017-01-22 LAB — CULTURE, URINE COMPREHENSIVE

## 2017-01-22 LAB — CBC
HEMATOCRIT: 34 % — AB (ref 40.0–52.0)
Hemoglobin: 11.2 g/dL — ABNORMAL LOW (ref 13.0–18.0)
MCH: 32.9 pg (ref 26.0–34.0)
MCHC: 33 g/dL (ref 32.0–36.0)
MCV: 99.7 fL (ref 80.0–100.0)
PLATELETS: 204 10*3/uL (ref 150–440)
RBC: 3.41 MIL/uL — AB (ref 4.40–5.90)
RDW: 13.6 % (ref 11.5–14.5)
WBC: 6.2 10*3/uL (ref 3.8–10.6)

## 2017-01-22 LAB — PROTIME-INR
INR: 3.38
PROTHROMBIN TIME: 33.9 s — AB (ref 11.4–15.2)

## 2017-01-22 LAB — POTASSIUM: Potassium: 3.1 mmol/L — ABNORMAL LOW (ref 3.5–5.1)

## 2017-01-22 NOTE — Telephone Encounter (Signed)
Patient notified that we have received cardiac clearance from Dr Caryl Comes. He will stop his Coumadin today.

## 2017-01-22 NOTE — Patient Instructions (Signed)
Your procedure is scheduled on: 01/29/17 Fri Report to Same Day Surgery 2nd floor medical mall [facility] Entrance-take elevator on left to 2nd floor.  Check in with surgery information desk.) To find out your arrival time please call [phone] between 1PM - 3PM on 01/28/17 Thurs  Remember: Instructions that are not followed completely may result in serious medical risk, up to and including death, or upon the discretion of your surgeon and anesthesiologist your surgery may need to be rescheduled.    _x___ 1. Do not eat food after midnight the night before your procedure. You may drink clear liquids up to 2 hours before you are scheduled to arrive at the hospital for your procedure.  Do not drink clear liquids within 2 hours of your scheduled arrival to the hospital.  Clear liquids include  --Water or Apple juice without pulp  --Clear carbohydrate beverage such as ClearFast or Gatorade  --Black Coffee or Clear Tea (No milk, no creamers, do not add anything to                  the coffee or Tea Type 1 and type 2 diabetics should only drink water.  No gum chewing or hard candies.     __x__ 2. No Alcohol for 24 hours before or after surgery.   __x__3. No Smoking for 24 prior to surgery.   ____  4. Bring all medications with you on the day of surgery if instructed.    __x__ 5. Notify your doctor if there is any change in your medical condition     (cold, fever, infections).     Do not wear jewelry, make-up, hairpins, clips or nail polish.  Do not wear lotions, powders, or perfumes. You may wear deodorant.  Do not shave 48 hours prior to surgery. Men may shave face and neck.  Do not bring valuables to the hospital.    [facility] is not responsible for any belongings or valuables.               Contacts, dentures or bridgework may not be worn into surgery.  Leave your suitcase in the car. After surgery it may be brought to your room.  For patients admitted to the hospital, discharge  time is determined by your                       treatment team.   Patients discharged the day of surgery will not be allowed to drive home.  You will need someone to drive you home and stay with you the night of your procedure.    Please read over the following fact sheets that you were given:   [facility] Preparing for Surgery and or MRSA Information   _x___ Take anti-hypertensive listed below, cardiac, seizure, asthma,     anti-reflux and psychiatric medicines. These include:  1. amiodarone (PACERONE) 200 MG tablet  2.levothyroxine (SYNTHROID, LEVOTHROID) 100 MCG tablet  3.pantoprazole (PROTONIX) 40 MG tablet  4.  5.  6.  ____Fleets enema or Magnesium Citrate as directed.   _x___ Use CHG Soap or sage wipes as directed on instruction sheet   ____ Use inhalers on the day of surgery and bring to hospital day of surgery  ____ Stop Metformin and Janumet 2 days prior to surgery.    ____ Take 1/2 of usual insulin dose the night before surgery and none on the morning     surgery.   _x___ Follow recommendations from  Cardiologist, Pulmonologist or PCP regarding          stopping Aspirin, Coumadin, Plavix ,Eliquis, Effient, or Pradaxa, and Pletal.  Stopped Coumadin 01/21/17  X____Stop Anti-inflammatories such as Advil, Aleve, Ibuprofen, Motrin, Naproxen, Naprosyn, Goodies powders or aspirin products. OK to take Tylenol and                          Celebrex.   _x___ Stop supplements until after surgery.  But may continue Vitamin D, Vitamin B,       and multivitamin.   ____ Bring C-Pap to the hospital.

## 2017-01-25 ENCOUNTER — Ambulatory Visit (INDEPENDENT_AMBULATORY_CARE_PROVIDER_SITE_OTHER): Payer: Medicare Other | Admitting: *Deleted

## 2017-01-25 DIAGNOSIS — I495 Sick sinus syndrome: Secondary | ICD-10-CM | POA: Diagnosis not present

## 2017-01-26 ENCOUNTER — Telehealth: Payer: Self-pay

## 2017-01-26 LAB — CUP PACEART REMOTE DEVICE CHECK
Battery Voltage: 3.02 V
Brady Statistic AS VP Percent: 0.74 %
Brady Statistic RA Percent Paced: 93.13 %
Date Time Interrogation Session: 20181029125518
Implantable Lead Implant Date: 20070525
Implantable Lead Location: 753859
Lead Channel Impedance Value: 380 Ohm
Lead Channel Pacing Threshold Pulse Width: 0.4 ms
Lead Channel Sensing Intrinsic Amplitude: 1.625 mV
Lead Channel Sensing Intrinsic Amplitude: 1.875 mV
Lead Channel Setting Pacing Amplitude: 2 V
Lead Channel Setting Pacing Amplitude: 2.5 V
Lead Channel Setting Pacing Pulse Width: 0.4 ms
Lead Channel Setting Sensing Sensitivity: 0.6 mV
MDC IDC LEAD IMPLANT DT: 20070525
MDC IDC LEAD LOCATION: 753860
MDC IDC MSMT BATTERY REMAINING LONGEVITY: 104 mo
MDC IDC MSMT LEADCHNL RA IMPEDANCE VALUE: 361 Ohm
MDC IDC MSMT LEADCHNL RA PACING THRESHOLD AMPLITUDE: 0.625 V
MDC IDC MSMT LEADCHNL RA PACING THRESHOLD PULSEWIDTH: 0.4 ms
MDC IDC MSMT LEADCHNL RA SENSING INTR AMPL: 1.875 mV
MDC IDC MSMT LEADCHNL RV IMPEDANCE VALUE: 342 Ohm
MDC IDC MSMT LEADCHNL RV IMPEDANCE VALUE: 418 Ohm
MDC IDC MSMT LEADCHNL RV PACING THRESHOLD AMPLITUDE: 0.875 V
MDC IDC MSMT LEADCHNL RV SENSING INTR AMPL: 1.625 mV
MDC IDC PG IMPLANT DT: 20171127
MDC IDC STAT BRADY AP VP PERCENT: 0.36 %
MDC IDC STAT BRADY AP VS PERCENT: 93.72 %
MDC IDC STAT BRADY AS VS PERCENT: 5.19 %
MDC IDC STAT BRADY RV PERCENT PACED: 1.13 %

## 2017-01-26 NOTE — Progress Notes (Signed)
01/22/17 EKG reading with prolonged QT was discussed with Dr. Rosey Bath; ok to proceed with surgery. K+ level result of 3.1 that was drawn on 01/22/17 was faxed to Dr. Dene Gentry office for review and possible treatment prior to surgery. Order to redraw K+ level day of surgery entered into EPIC.

## 2017-01-26 NOTE — Progress Notes (Signed)
Remote pacemaker transmission.   

## 2017-01-26 NOTE — Telephone Encounter (Signed)
Patient's daughter notified per [facility] that pre-admit labs of PT and Potassium came back abnormal.  It was confirmed that patient did stop coumadin the day of pre-admit on 10-26. Per [facility] patient was encouraged to increase potasium with bananas, kiwi, and OJ. Will repeat labs on day of surgery.

## 2017-01-28 ENCOUNTER — Ambulatory Visit: Payer: Medicare Other

## 2017-01-28 ENCOUNTER — Other Ambulatory Visit (INDEPENDENT_AMBULATORY_CARE_PROVIDER_SITE_OTHER): Payer: Medicare Other

## 2017-01-28 DIAGNOSIS — R7989 Other specified abnormal findings of blood chemistry: Secondary | ICD-10-CM | POA: Diagnosis not present

## 2017-01-28 DIAGNOSIS — I480 Paroxysmal atrial fibrillation: Secondary | ICD-10-CM | POA: Diagnosis not present

## 2017-01-28 MED ORDER — DEXTROSE 5 % IV SOLN
1.0000 g | Freq: Once | INTRAVENOUS | Status: DC
  Filled 2017-01-28: qty 10

## 2017-01-29 ENCOUNTER — Ambulatory Visit: Payer: Medicare Other | Admitting: Registered Nurse

## 2017-01-29 ENCOUNTER — Encounter: Payer: Self-pay | Admitting: *Deleted

## 2017-01-29 ENCOUNTER — Ambulatory Visit
Admission: RE | Admit: 2017-01-29 | Discharge: 2017-01-29 | Disposition: A | Discharge status: Home / Self Care | Payer: Medicare Other | Source: Ambulatory Visit | Attending: Urology | Admitting: Urology

## 2017-01-29 ENCOUNTER — Encounter
Admission: RE | Disposition: A | Discharge status: Home / Self Care | Payer: Self-pay | Source: Ambulatory Visit | Attending: Urology

## 2017-01-29 DIAGNOSIS — I10 Essential (primary) hypertension: Secondary | ICD-10-CM | POA: Diagnosis not present

## 2017-01-29 DIAGNOSIS — E538 Deficiency of other specified B group vitamins: Secondary | ICD-10-CM | POA: Diagnosis not present

## 2017-01-29 DIAGNOSIS — Z923 Personal history of irradiation: Secondary | ICD-10-CM | POA: Diagnosis not present

## 2017-01-29 DIAGNOSIS — I480 Paroxysmal atrial fibrillation: Secondary | ICD-10-CM | POA: Insufficient documentation

## 2017-01-29 DIAGNOSIS — Z7901 Long term (current) use of anticoagulants: Secondary | ICD-10-CM | POA: Insufficient documentation

## 2017-01-29 DIAGNOSIS — R300 Dysuria: Secondary | ICD-10-CM | POA: Diagnosis present

## 2017-01-29 DIAGNOSIS — Z950 Presence of cardiac pacemaker: Secondary | ICD-10-CM | POA: Insufficient documentation

## 2017-01-29 DIAGNOSIS — N99111 Postprocedural bulbous urethral stricture: Secondary | ICD-10-CM | POA: Diagnosis not present

## 2017-01-29 DIAGNOSIS — D649 Anemia, unspecified: Secondary | ICD-10-CM | POA: Insufficient documentation

## 2017-01-29 DIAGNOSIS — N35912 Unspecified bulbous urethral stricture, male: Secondary | ICD-10-CM | POA: Insufficient documentation

## 2017-01-29 DIAGNOSIS — Z85840 Personal history of malignant neoplasm of eye: Secondary | ICD-10-CM | POA: Insufficient documentation

## 2017-01-29 DIAGNOSIS — Z79899 Other long term (current) drug therapy: Secondary | ICD-10-CM | POA: Diagnosis not present

## 2017-01-29 DIAGNOSIS — N35011 Post-traumatic bulbous urethral stricture: Secondary | ICD-10-CM

## 2017-01-29 DIAGNOSIS — E785 Hyperlipidemia, unspecified: Secondary | ICD-10-CM | POA: Insufficient documentation

## 2017-01-29 DIAGNOSIS — I4891 Unspecified atrial fibrillation: Secondary | ICD-10-CM | POA: Diagnosis not present

## 2017-01-29 DIAGNOSIS — Z8546 Personal history of malignant neoplasm of prostate: Secondary | ICD-10-CM | POA: Insufficient documentation

## 2017-01-29 DIAGNOSIS — E039 Hypothyroidism, unspecified: Secondary | ICD-10-CM | POA: Diagnosis not present

## 2017-01-29 DIAGNOSIS — M1990 Unspecified osteoarthritis, unspecified site: Secondary | ICD-10-CM | POA: Diagnosis not present

## 2017-01-29 DIAGNOSIS — K219 Gastro-esophageal reflux disease without esophagitis: Secondary | ICD-10-CM | POA: Insufficient documentation

## 2017-01-29 DIAGNOSIS — N35919 Unspecified urethral stricture, male, unspecified site: Secondary | ICD-10-CM | POA: Diagnosis not present

## 2017-01-29 HISTORY — PX: CYSTOSCOPY WITH URETHRAL DILATATION: SHX5125

## 2017-01-29 LAB — PROTIME-INR
INR: 1.29
PROTHROMBIN TIME: 16 s — AB (ref 11.4–15.2)

## 2017-01-29 LAB — POCT I-STAT 4, (NA,K, GLUC, HGB,HCT)
GLUCOSE: 93 mg/dL (ref 65–99)
HCT: 33 % — ABNORMAL LOW (ref 39.0–52.0)
Hemoglobin: 11.2 g/dL — ABNORMAL LOW (ref 13.0–17.0)
POTASSIUM: 3 mmol/L — AB (ref 3.5–5.1)
SODIUM: 143 mmol/L (ref 135–145)

## 2017-01-29 LAB — TSH: TSH: 0.911 u[IU]/mL (ref 0.450–4.500)

## 2017-01-29 SURGERY — CYSTOSCOPY, WITH URETHRAL DILATION
Anesthesia: General | Site: Urethra | Wound class: Clean Contaminated

## 2017-01-29 MED ORDER — FENTANYL CITRATE (PF) 100 MCG/2ML IJ SOLN
INTRAMUSCULAR | Status: AC
  Filled 2017-01-29: qty 2

## 2017-01-29 MED ORDER — SODIUM CHLORIDE 0.9 % IV SOLN
INTRAVENOUS | Status: DC
  Administered 2017-01-29: 10:00:00 via INTRAVENOUS

## 2017-01-29 MED ORDER — SUGAMMADEX SODIUM 200 MG/2ML IV SOLN
INTRAVENOUS | Status: AC
  Filled 2017-01-29: qty 2

## 2017-01-29 MED ORDER — DEXAMETHASONE SODIUM PHOSPHATE 10 MG/ML IJ SOLN
INTRAMUSCULAR | Status: DC | PRN
  Administered 2017-01-29: 5 mg via INTRAVENOUS

## 2017-01-29 MED ORDER — ONDANSETRON HCL 4 MG/2ML IJ SOLN: 4.0000 mg | Freq: Once | INTRAMUSCULAR | Status: DC | PRN

## 2017-01-29 MED ORDER — CEFUROXIME AXETIL 500 MG PO TABS: 500.0000 mg | ORAL_TABLET | Freq: Two times a day (BID) | ORAL | 0 refills | Status: AC

## 2017-01-29 MED ORDER — FENTANYL CITRATE (PF) 100 MCG/2ML IJ SOLN
25.0000 ug | INTRAMUSCULAR | Status: DC | PRN
  Administered 2017-01-29 (×2): 25 ug via INTRAVENOUS

## 2017-01-29 MED ORDER — PROPOFOL 10 MG/ML IV BOLUS
INTRAVENOUS | Status: AC
  Filled 2017-01-29: qty 20

## 2017-01-29 MED ORDER — ONDANSETRON HCL 4 MG/2ML IJ SOLN
INTRAMUSCULAR | Status: AC
  Filled 2017-01-29: qty 2

## 2017-01-29 MED ORDER — LIDOCAINE HCL (CARDIAC) 20 MG/ML IV SOLN
INTRAVENOUS | Status: DC | PRN
  Administered 2017-01-29: 60 mg via INTRAVENOUS

## 2017-01-29 MED ORDER — PROPOFOL 10 MG/ML IV BOLUS
INTRAVENOUS | Status: DC | PRN
  Administered 2017-01-29: 20 mg via INTRAVENOUS
  Administered 2017-01-29: 80 mg via INTRAVENOUS

## 2017-01-29 MED ORDER — DEXAMETHASONE SODIUM PHOSPHATE 10 MG/ML IJ SOLN
INTRAMUSCULAR | Status: AC
  Filled 2017-01-29: qty 1

## 2017-01-29 MED ORDER — LIDOCAINE HCL (PF) 2 % IJ SOLN
INTRAMUSCULAR | Status: AC
  Filled 2017-01-29: qty 10

## 2017-01-29 MED ORDER — FENTANYL CITRATE (PF) 100 MCG/2ML IJ SOLN
INTRAMUSCULAR | Status: DC | PRN
  Administered 2017-01-29 (×2): 25 ug via INTRAVENOUS

## 2017-01-29 MED ORDER — FENTANYL CITRATE (PF) 100 MCG/2ML IJ SOLN
INTRAMUSCULAR | Status: AC
  Administered 2017-01-29: 25 ug via INTRAVENOUS
  Filled 2017-01-29: qty 2

## 2017-01-29 SURGICAL SUPPLY — 26 items
BAG DRAIN CYSTO-URO LG1000N (MISCELLANEOUS) ×3 IMPLANT
BAG URO DRAIN 2000ML W/SPOUT (MISCELLANEOUS) ×3 IMPLANT
BASIN GRAD PLASTIC 32OZ STRL (MISCELLANEOUS) IMPLANT
CATH FOL 2WAY LX 16X5 (CATHETERS) IMPLANT
CATH FOLEY 2W COUNCIL 20FR 5CC (CATHETERS) ×3 IMPLANT
CATH SET URETHRAL DILATOR (CATHETERS) ×3 IMPLANT
CONRAY 43 FOR UROLOGY 50M (MISCELLANEOUS) IMPLANT
DRAPE SHEET LG 3/4 BI-LAMINATE (DRAPES) ×3 IMPLANT
ELECT REM PT RETURN 9FT ADLT (ELECTROSURGICAL)
ELECTRODE REM PT RTRN 9FT ADLT (ELECTROSURGICAL) IMPLANT
GLOVE BIO SURGEON STRL SZ 6.5 (GLOVE) IMPLANT
GLOVE BIO SURGEON STRL SZ7 (GLOVE) ×6 IMPLANT
GOWN STRL REUS W/ TWL LRG LVL3 (GOWN DISPOSABLE) ×4 IMPLANT
GOWN STRL REUS W/TWL LRG LVL3 (GOWN DISPOSABLE) ×2
HOLDER FOLEY CATH W/STRAP (MISCELLANEOUS) ×3 IMPLANT
KIT RM TURNOVER CYSTO AR (KITS) ×3 IMPLANT
PACK CYSTO AR (MISCELLANEOUS) ×3 IMPLANT
SET CYSTO W/LG BORE CLAMP LF (SET/KITS/TRAYS/PACK) ×3 IMPLANT
SOL PREP PVP 2OZ (MISCELLANEOUS)
SOLUTION PREP PVP 2OZ (MISCELLANEOUS) IMPLANT
SURGILUBE 2OZ TUBE FLIPTOP (MISCELLANEOUS) ×3 IMPLANT
SYR 30ML LL (SYRINGE) IMPLANT
SYRINGE 10CC LL (SYRINGE) IMPLANT
SYRINGE IRR TOOMEY STRL 70CC (SYRINGE) IMPLANT
WATER STERILE IRR 1000ML POUR (IV SOLUTION) ×3 IMPLANT
WATER STERILE IRR 3000ML UROMA (IV SOLUTION) ×3 IMPLANT

## 2017-01-29 NOTE — Discharge Instructions (Addendum)
Indwelling Urinary Catheter Care, Adult Take good care of your catheter to keep it working and to prevent problems. How to wear your catheter Attach your catheter to your leg with tape (adhesive tape) or a leg strap. Make sure it is not too tight. If you use tape, remove any bits of tape that are already on the catheter. How to wear a drainage bag You should have:  A large overnight bag.  A small leg bag.  Overnight Bag You may wear the overnight bag at any time. Always keep the bag below the level of your bladder but off the floor. When you sleep, put a clean plastic bag in a wastebasket. Then hang the bag inside the wastebasket. Leg Bag Never wear the leg bag at night. Always wear the leg bag below your knee. Keep the leg bag secure with a leg strap or tape. How to care for your skin  Clean the skin around the catheter at least once every day.  Shower every day. Do not take baths.  Put creams, lotions, or ointments on your genital area only as told by your doctor.  Do not use powders, sprays, or lotions on your genital area. How to clean your catheter and your skin 1. Wash your hands with soap and water. 2. Wet a washcloth in warm water and gentle (mild) soap. 3. Use the washcloth to clean the skin where the catheter enters your body. Clean downward and wipe away from the catheter in small circles. Do not wipe toward the catheter. 4. Pat the area dry with a clean towel. Make sure to clean off all soap. How to care for your drainage bags Empty your drainage bag when it is ?- full or at least 2-3 times a day. Replace your drainage bag once a month or sooner if it starts to smell bad or look dirty. Do not clean your drainage bag unless told by your doctor. Emptying a drainage bag  Supplies Needed  Rubbing alcohol.  Gauze pad or cotton ball.  Tape or a leg strap.  Steps 1. Wash your hands with soap and water. 2. Separate (detach) the bag from your leg. 3. Hold the bag over  the toilet or a clean container. Keep the bag below your hips and bladder. This stops pee (urine) from going back into the tube. 4. Open the pour spout at the bottom of the bag. 5. Empty the pee into the toilet or container. Do not let the pour spout touch any surface. 6. Put rubbing alcohol on a gauze pad or cotton ball. 7. Use the gauze pad or cotton ball to clean the pour spout. 8. Close the pour spout. 9. Attach the bag to your leg with tape or a leg strap. 10. Wash your hands.  Changing a drainage bag Supplies Needed  Alcohol wipes.  A clean drainage bag.  Adhesive tape or a leg strap.  Steps 1. Wash your hands with soap and water. 2. Separate the dirty bag from your leg. 3. Pinch the rubber catheter with your fingers so that pee does not spill out. 4. Separate the catheter tube from the drainage tube where these tubes connect (at the connection valve). Do not let the tubes touch any surface. 5. Clean the end of the catheter tube with an alcohol wipe. Use a different alcohol wipe to clean the end of the drainage tube. 6. Connect the catheter tube to the drainage tube of the clean bag. 7. Attach the new bag to  the leg with adhesive tape or a leg strap. °8. Wash your hands. ° °How to prevent infection and other problems °· Never pull on your catheter or try to remove it. Pulling can damage tissue in your body. °· Always wash your hands before and after touching your catheter. °· If a leg strap gets wet, replace it with a dry one. °· Drink enough fluids to keep your pee clear or pale yellow, or as told by your doctor. °· Do not let the drainage bag or tubing touch the floor. °· Wear cotton underwear. °· If you are male, wipe from front to back after you poop (have a bowel movement). °· Check on the catheter often to make sure it works and the tubing is not twisted. °Get help if: °· Your pee is cloudy. °· Your pee smells unusually bad. °· Your pee is not draining into the bag. °· Your  tube gets clogged. °· Your catheter starts to leak. °· Your bladder feels full. °Get help right away if: °· You have redness, swelling, or pain where the catheter enters your body. °· You have fluid, pus, or a bad smell coming from the area where the catheter enters your body. °· The area where the catheter enters your body feels warm. °· You have a fever. °· You have pain in your: °? Stomach (abdomen). °? Legs. °? Lower back. °? Bladder. °· You see blood fill the catheter. °· Your pee is pink or red. °· You feel sick to your stomach (nauseous). °· You throw up (vomit). °· You have chills. °· Your catheter gets pulled out. °This information is not intended to replace advice given to you by your health care provider. Make sure you discuss any questions you have with your health care provider. °Document Released: 07/11/2012 Document Revised: 02/12/2016 Document Reviewed: 08/29/2013 °Elsevier Interactive Patient Education © 2018 [employer] ° ° °AMBULATORY SURGERY  °DISCHARGE INSTRUCTIONS ° ° °1) The drugs that you were given will stay in your system until tomorrow so for the next 24 hours you should not: ° °A) Drive an automobile °B) Make any legal decisions °C) Drink any alcoholic beverage ° ° °2) You may resume regular meals tomorrow.  Today it is better to start with liquids and gradually work up to solid foods. ° °You may eat anything you prefer, but it is better to start with liquids, then soup and crackers, and gradually work up to solid foods. ° ° °3) Please notify your doctor immediately if you have any unusual bleeding, trouble breathing, redness and pain at the surgery site, drainage, fever, or pain not relieved by medication. ° ° ° °4) Additional Instructions: ° ° ° ° ° ° ° °Please contact your physician with any problems or Same Day Surgery at [phone], Monday through Friday 6 am to 4 pm, or [place] [facility] [place] Main number at [phone]. ° °

## 2017-01-29 NOTE — H&P (Signed)
Interval H&P  No changes to H&P. All questions were answered and he desires to proceed.   Heart/lung assessment performed- no change

## 2017-01-29 NOTE — Transfer of Care (Signed)
Immediate Anesthesia Transfer of Care Note  Patient: [name] [name]  Procedure(s) Performed: CYSTOSCOPY WITH URETHRAL DILATATION (N/A Urethra)  Patient Location: PACU  Anesthesia Type:General  Level of Consciousness: awake, alert  and oriented  Airway & Oxygen Therapy: Patient Spontanous Breathing and Patient connected to nasal cannula oxygen  Post-op Assessment: Report given to RN and Post -op Vital signs reviewed and stable  Post vital signs: Reviewed and stable  Last Vitals:  Vitals:   01/29/17 1137 01/29/17 1138  BP: (!) 150/85 (!) 150/85  Pulse: 62 62  Resp: 10 (!) 9  Temp: (!) 36.4 C (!) 36.4 C  SpO2: 100% 100%    Last Pain:  Vitals:   01/29/17 1138  TempSrc: Temporal  PainSc:          Complications: No apparent anesthesia complications

## 2017-01-29 NOTE — Anesthesia Postprocedure Evaluation (Signed)
Anesthesia Post Note  Patient: [name] [name]  Procedure(s) Performed: CYSTOSCOPY WITH URETHRAL DILATATION (N/A Urethra)  Patient location during evaluation: PACU Anesthesia Type: General Level of consciousness: awake and alert and oriented Pain management: pain level controlled Vital Signs Assessment: post-procedure vital signs reviewed and stable Respiratory status: spontaneous breathing Cardiovascular status: blood pressure returned to baseline Anesthetic complications: no     Last Vitals:  Vitals:   01/29/17 1222 01/29/17 1239  BP: (!) 147/71 (!) 144/88  Pulse: (!) 59 66  Resp: 11 14  Temp: (!) 36.4 C (!) 36.1 C  SpO2: 99% 98%    Last Pain:  Vitals:   01/29/17 1239  TempSrc: Temporal  PainSc: 2                  Fadel Clason

## 2017-01-29 NOTE — OR Nursing (Signed)
Foley bag changed to leg bag prior to d/c - verbalizes understanding of changing bag at bedside

## 2017-01-29 NOTE — Anesthesia Procedure Notes (Signed)
Procedure Name: LMA Insertion Date/Time: 01/29/2017 11:04 AM Performed by: Doreen Salvage Pre-anesthesia Checklist: Patient identified, Patient being monitored, Timeout performed, Emergency Drugs available and Suction available Patient Re-evaluated:Patient Re-evaluated prior to induction Oxygen Delivery Method: Circle system utilized Preoxygenation: Pre-oxygenation with 100% oxygen Induction Type: IV induction Ventilation: Mask ventilation without difficulty LMA: LMA inserted LMA Size: 3.5 Tube type: Oral Number of attempts: 1 Placement Confirmation: positive ETCO2 and breath sounds checked- equal and bilateral Tube secured with: Tape Dental Injury: Teeth and Oropharynx as per pre-operative assessment

## 2017-01-29 NOTE — Progress Notes (Signed)
Dr. Kayleen Memos (anesthesia) aware of K+ level result and pending PT result. No new orders.

## 2017-01-29 NOTE — Anesthesia Preprocedure Evaluation (Signed)
Anesthesia Evaluation  Patient identified by MRN, date of birth, ID band Patient awake    Reviewed: Allergy & Precautions, NPO status , Patient's Chart, lab work & pertinent test results  Airway Mallampati: II  TM Distance: >3 FB     Dental  (+) Caps   Pulmonary neg pulmonary ROS,    Pulmonary exam normal        Cardiovascular hypertension, Pt. on medications Normal cardiovascular exam+ dysrhythmias Atrial Fibrillation + pacemaker      Neuro/Psych negative neurological ROS  negative psych ROS   GI/Hepatic Neg liver ROS, PUD, GERD  Medicated and Controlled,  Endo/Other  Hypothyroidism   Renal/GU negative Renal ROS     Musculoskeletal  (+) Arthritis , Osteoarthritis,    Abdominal Normal abdominal exam  (+)   Peds negative pediatric ROS (+)  Hematology  (+) anemia ,   Anesthesia Other Findings   Reproductive/Obstetrics                             Anesthesia Physical Anesthesia Plan  ASA: III  Anesthesia Plan: General   Post-op Pain Management:    Induction: Intravenous  PONV Risk Score and Plan:   Airway Management Planned: LMA  Additional Equipment:   Intra-op Plan:   Post-operative Plan: Extubation in OR  Informed Consent: I have reviewed the patients History and Physical, chart, labs and discussed the procedure including the risks, benefits and alternatives for the proposed anesthesia with the patient or authorized representative who has indicated his/her understanding and acceptance.   Dental advisory given  Plan Discussed with: CRNA and Surgeon  Anesthesia Plan Comments:         Anesthesia Quick Evaluation

## 2017-01-29 NOTE — Anesthesia Post-op Follow-up Note (Signed)
Anesthesia QCDR form completed.        

## 2017-01-29 NOTE — Op Note (Signed)
Date of procedure: 01/29/2017  Preoperative diagnosis:  1. Urethral stricture  Postoperative diagnosis:  1. Urethral stricture  Procedure: 1. Cystoscopy with urethral dilation  Surgeon: John Giovanni, MD  Anesthesia: General  Complications: None  Intraoperative findings: Proximal bulbar urethral stricture  EBL: Minimal  Specimens: None  Drains: 20 French Foley catheter  Indication: [name] [name] is a 83 year old male recently seen with obstructive voiding symptoms and a residual of greater than 200 mL.  Attempts at placing a Foley catheter were unsuccessful with resistance met in the region of the bulbar urethra.  After reviewing the management options for treatment, he elected to proceed with the above surgical procedure(s). We have discussed the potential benefits and risks of the procedure, side effects of the proposed treatment, the likelihood of the patient achieving the goals of the procedure, and any potential problems that might occur during the procedure or recuperation. Informed consent has been obtained.  Description of procedure:  The patient was taken to the operating room and general anesthesia was induced.  The patient was placed in the dorsal lithotomy position, prepped and draped in the usual sterile fashion, and preoperative antibiotics were administered. A preoperative time-out was performed.   A 22 French cystoscope was lubricated and passed under direct vision.  The cystoscope was advanced proximally and a stricture estimated at 41 [place] was identified in the proximal bulbar urethra.  A 0.038 Sensor guidewire was placed to the cystoscope and easily advanced through the stricture and advanced in the bladder.  The cystoscope was removed and the stricture was dilated with over-the-wire dilators from [place].  The cystoscope was repassed.  The prostate was nonocclusive and the bladder neck was open.  Panendoscopy was performed and no solid or papillary  lesions were noted.  The ureteral orifices were normal appearing.  He has a history of hernia mesh in the anterior part of the bladder and this could not be visualized with the rigid scope.  The cystoscope was removed and a 20 French [place] catheter was placed over the wire without difficulty.  He tolerated procedure well without complications.  He was transported to the PACU in stable condition.    John Giovanni, M.D.

## 2017-02-02 ENCOUNTER — Encounter: Payer: Self-pay | Admitting: Cardiology

## 2017-02-02 NOTE — Progress Notes (Signed)
Letter  

## 2017-02-03 ENCOUNTER — Telehealth: Payer: Self-pay

## 2017-02-03 NOTE — Telephone Encounter (Signed)
Pt is aware and agreeable to normal results  

## 2017-02-04 ENCOUNTER — Ambulatory Visit (INDEPENDENT_AMBULATORY_CARE_PROVIDER_SITE_OTHER): Payer: Medicare Other

## 2017-02-04 VITALS — BP 108/70 | HR 80 | Ht 69.0 in | Wt 133.0 lb

## 2017-02-04 DIAGNOSIS — R339 Retention of urine, unspecified: Secondary | ICD-10-CM

## 2017-02-04 LAB — BLADDER SCAN AMB NON-IMAGING: SCAN RESULT: 178

## 2017-02-04 MED ORDER — TAMSULOSIN HCL 0.4 MG PO CAPS: 0.4000 mg | ORAL_CAPSULE | Freq: Every day | ORAL | 3 refills | Status: DC

## 2017-02-04 NOTE — Addendum Note (Signed)
Addended by: Lestine Box on: 02/04/2017 04:36 PM   Modules accepted: Orders

## 2017-02-04 NOTE — Progress Notes (Addendum)
Fill and Pull Catheter Removal  Patient is present today for a catheter removal.  Patient was cleaned and prepped in a sterile fashion 111ml of sterile water/ saline was instilled into the bladder when the patient felt the urge to urinate. 70ml of water was then drained from the balloon.  A 20FR foley cath was removed from the bladder no complications were noted .  Patient as then given some time to void on their own.  Patient tolerated well.  Preformed by: Toniann Fail, LPN   Follow up/ Additional notes: Pt had a bladder spasm after 128mL was instilled. All urine was excreted with spasm. Reinforced with pt to drink 4-6 bottles of water today and if not able to urinate or develops pain to RTC by 3pm. Pt voiced understanding.   Blood pressure 108/70, pulse 80, height 5\' 9"  (1.753 m), weight 133 lb (60.3 kg).  Pt RTC today at 4 stating he was concerned about his urination. Pt stated that he has been able to urinate drops but not a good stream. PVR- 178. Per [place] flomax was given and pt is to f/u with Dr. Bernardo Heater. Pt voiced understanding.

## 2017-02-05 ENCOUNTER — Ambulatory Visit: Payer: Medicare Other

## 2017-02-15 ENCOUNTER — Telehealth: Payer: Self-pay | Admitting: Internal Medicine

## 2017-02-15 NOTE — Telephone Encounter (Signed)
Looks as though Dr Caryl Comes refilled medication last year; however, prior to that Dr Lorelei Pont. Should probable send back to PCP for management.

## 2017-02-15 NOTE — Telephone Encounter (Signed)
°*  STAT* If patient is at the pharmacy, call can be transferred to refill team.   1. Which medications need to be refilled? (please list name of each medication and dose if known)  Levothyroxine   2. Which pharmacy/location (including street and city if local pharmacy) is medication to be sent to? [facility] on [address] next to [address]   3. Do they need a 30 day or 90 day supply? 90 day

## 2017-02-15 NOTE — Telephone Encounter (Signed)
Please advise if ok to refill Levothyroxine.

## 2017-02-16 ENCOUNTER — Other Ambulatory Visit: Payer: Self-pay | Admitting: Internal Medicine

## 2017-02-16 NOTE — Telephone Encounter (Signed)
This pt is not a pt of [facility].

## 2017-02-16 NOTE — Telephone Encounter (Signed)
Please review for refill.  

## 2017-02-16 NOTE — Telephone Encounter (Signed)
Please disregard request.   LMOVM for pt to contact pcp for Levothyroxine refill.

## 2017-02-17 ENCOUNTER — Ambulatory Visit (INDEPENDENT_AMBULATORY_CARE_PROVIDER_SITE_OTHER): Payer: Medicare Other

## 2017-02-17 DIAGNOSIS — I480 Paroxysmal atrial fibrillation: Secondary | ICD-10-CM | POA: Diagnosis not present

## 2017-02-17 DIAGNOSIS — Z5181 Encounter for therapeutic drug level monitoring: Secondary | ICD-10-CM

## 2017-02-17 LAB — POCT INR: INR: 2.5

## 2017-02-17 NOTE — Patient Instructions (Signed)
Please continue taking 1mg  daily except 2mg s on Mondays, Wednesdays and Saturdays.   Recheck in 5 weeks.

## 2017-02-22 ENCOUNTER — Telehealth: Payer: Self-pay | Admitting: Internal Medicine

## 2017-02-22 MED ORDER — LEVOTHYROXINE SODIUM 100 MCG PO TABS: 100.0000 ug | ORAL_TABLET | Freq: Every day | ORAL | 1 refills | Status: DC

## 2017-02-22 NOTE — Telephone Encounter (Signed)
Pt's pharmacy is requesting a refill on Levothyroxine 100 mg tablet. Would you like to refill this medication? Please advise

## 2017-02-22 NOTE — Telephone Encounter (Signed)
Refill sent in for 30 day supply with 1 refill and note to call our office to schedule an appointment for further refills as patient is due for 6 month f/u.

## 2017-03-01 ENCOUNTER — Telehealth: Payer: Self-pay

## 2017-03-01 NOTE — Telephone Encounter (Signed)
Patient came in to schedule his next B12 appointment.  We have made him an appointment for this Wed, 03/03/17 for injection.  Would you like to recheck his B12 level before we administer the shot that day?  Note:  He has not been keeping up with monthly injections and last injection in August of this year.    Last B12 level 03/11/16.    Thank

## 2017-03-01 NOTE — Telephone Encounter (Signed)
I would just schedule b12, give it to him and we can check when he is next in the office for regular visit

## 2017-03-02 ENCOUNTER — Other Ambulatory Visit: Payer: Self-pay | Admitting: Family Medicine

## 2017-03-03 ENCOUNTER — Ambulatory Visit (INDEPENDENT_AMBULATORY_CARE_PROVIDER_SITE_OTHER): Payer: Medicare Other

## 2017-03-03 DIAGNOSIS — E538 Deficiency of other specified B group vitamins: Secondary | ICD-10-CM | POA: Diagnosis not present

## 2017-03-03 MED ORDER — CYANOCOBALAMIN 1000 MCG/ML IJ SOLN
1000.0000 ug | Freq: Once | INTRAMUSCULAR | Status: AC
  Administered 2017-03-03: 1000 ug via INTRAMUSCULAR

## 2017-03-03 NOTE — Telephone Encounter (Signed)
Noted.  Patient received B12 injection today.

## 2017-03-10 ENCOUNTER — Telehealth: Payer: Self-pay | Admitting: Family Medicine

## 2017-03-10 MED ORDER — TORSEMIDE 20 MG PO TABS: 20.0000 mg | ORAL_TABLET | Freq: Every day | ORAL | 1 refills | Status: DC | PRN

## 2017-03-10 NOTE — Telephone Encounter (Signed)
Best number [phone]  Pt came in today needing to get a refill on Torsemide tab 20mg  Take 1 by mouth daily as needed  Please send to [facility] [address] [place] [phone]  Please advise when called in Pt stated he has about 1 week of pills left

## 2017-03-10 NOTE — Telephone Encounter (Signed)
Refill sent as requested.  Left message for [name] [name] that refill has been sent to his pharmacy.

## 2017-03-19 DIAGNOSIS — H401132 Primary open-angle glaucoma, bilateral, moderate stage: Secondary | ICD-10-CM | POA: Diagnosis not present

## 2017-03-24 ENCOUNTER — Ambulatory Visit (INDEPENDENT_AMBULATORY_CARE_PROVIDER_SITE_OTHER): Payer: Medicare Other

## 2017-03-24 DIAGNOSIS — I480 Paroxysmal atrial fibrillation: Secondary | ICD-10-CM | POA: Diagnosis not present

## 2017-03-24 DIAGNOSIS — Z5181 Encounter for therapeutic drug level monitoring: Secondary | ICD-10-CM

## 2017-03-24 LAB — POCT INR: INR: 3.5

## 2017-03-24 NOTE — Patient Instructions (Signed)
Please skip coumadin tonight, then resume taking 1mg  daily except 2mg s on Mondays, Wednesdays and Saturdays.   Recheck in 3 weeks.

## 2017-04-06 ENCOUNTER — Ambulatory Visit (INDEPENDENT_AMBULATORY_CARE_PROVIDER_SITE_OTHER): Payer: Medicare Other

## 2017-04-06 VITALS — BP 100/60 | HR 65 | Temp 97.5°F | Ht 68.5 in | Wt 141.8 lb

## 2017-04-06 DIAGNOSIS — E538 Deficiency of other specified B group vitamins: Secondary | ICD-10-CM

## 2017-04-06 DIAGNOSIS — E7849 Other hyperlipidemia: Secondary | ICD-10-CM

## 2017-04-06 DIAGNOSIS — I10 Essential (primary) hypertension: Secondary | ICD-10-CM | POA: Diagnosis not present

## 2017-04-06 DIAGNOSIS — Z23 Encounter for immunization: Secondary | ICD-10-CM

## 2017-04-06 DIAGNOSIS — E038 Other specified hypothyroidism: Secondary | ICD-10-CM

## 2017-04-06 DIAGNOSIS — Z0000 Encounter for general adult medical examination without abnormal findings: Secondary | ICD-10-CM

## 2017-04-06 DIAGNOSIS — S61412A Laceration without foreign body of left hand, initial encounter: Secondary | ICD-10-CM

## 2017-04-06 LAB — BASIC METABOLIC PANEL
BUN: 32 mg/dL — ABNORMAL HIGH (ref 6–23)
CHLORIDE: 108 meq/L (ref 96–112)
CO2: 28 meq/L (ref 19–32)
CREATININE: 1.6 mg/dL — AB (ref 0.40–1.50)
Calcium: 8.8 mg/dL (ref 8.4–10.5)
GFR: 43.18 mL/min — ABNORMAL LOW (ref >60.00)
Glucose, Bld: 94 mg/dL (ref 70–99)
POTASSIUM: 4.1 meq/L (ref 3.5–5.1)
SODIUM: 139 meq/L (ref 135–145)

## 2017-04-06 LAB — CBC WITH DIFFERENTIAL/PLATELET
BASOS ABS: 0.1 10*3/uL (ref 0.0–0.1)
BASOS PCT: 0.9 % (ref 0.0–3.0)
Eosinophils Absolute: 0.2 10*3/uL (ref 0.0–0.7)
Eosinophils Relative: 3.2 % (ref 0.0–5.0)
HEMATOCRIT: 32.4 % — AB (ref 39.0–52.0)
Hemoglobin: 10.5 g/dL — ABNORMAL LOW (ref 13.0–17.0)
LYMPHS ABS: 2 10*3/uL (ref 0.7–4.0)
Lymphocytes Relative: 32.2 % (ref 12.0–46.0)
MCHC: 32.5 g/dL (ref 30.0–36.0)
MCV: 98.9 fl (ref 78.0–100.0)
MONOS PCT: 9.7 % (ref 3.0–12.0)
Monocytes Absolute: 0.6 10*3/uL (ref 0.1–1.0)
NEUTROS ABS: 3.3 10*3/uL (ref 1.4–7.7)
NEUTROS PCT: 54 % (ref 43.0–77.0)
PLATELETS: 212 10*3/uL (ref 150.0–400.0)
RBC: 3.28 Mil/uL — ABNORMAL LOW (ref 4.22–5.81)
RDW: 13.6 % (ref 11.5–15.5)
WBC: 6.1 10*3/uL (ref 4.0–10.5)

## 2017-04-06 LAB — HEPATIC FUNCTION PANEL
ALK PHOS: 97 U/L (ref 39–117)
ALT: 15 U/L (ref 0–53)
AST: 21 U/L (ref 0–37)
Albumin: 3.4 g/dL — ABNORMAL LOW (ref 3.5–5.2)
Bilirubin, Direct: 0.2 mg/dL (ref 0.0–0.3)
TOTAL PROTEIN: 6.6 g/dL (ref 6.0–8.3)
Total Bilirubin: 0.7 mg/dL (ref 0.2–1.2)

## 2017-04-06 LAB — TSH: TSH: 0.3 u[IU]/mL — ABNORMAL LOW (ref 0.35–4.50)

## 2017-04-06 MED ORDER — CYANOCOBALAMIN 1000 MCG/ML IJ SOLN
1000.0000 ug | Freq: Once | INTRAMUSCULAR | Status: AC
  Administered 2017-04-06: 1000 ug via INTRAMUSCULAR

## 2017-04-06 NOTE — Progress Notes (Signed)
PCP notes:   Health maintenance:  Tetanus vaccine - administered  Abnormal screenings:   Fall risk - hx of fall without injury. Hearing - failed  Hearing Screening   125Hz  250Hz  500Hz  1000Hz  2000Hz  3000Hz  4000Hz  6000Hz  8000Hz   Right ear:   0 0 0  0    Left ear:   0 0 0  0     Patient concerns:   None  Nurse concerns:  Patient reported injury to left hand as result of hand hitting a door handle. Bandage removed. Upon assessment, a Stage I flat type skin tear was noted to thenar region on left hand. Darkened skin flap present covering wound base. Normal wound margins. Wound base measures 1.9 cm x 1.3 cm x 0 cm. There is no exudate or odor present. Skin surrounding wound base is dry, intact, and slightly pink. Bandage and triple antibiotic ointment applied. Dr. Danise Mina was consulted. Tetanus vaccine administered.   B12 injection administered to right deltoid per standing order.   Next PCP appt:   04/12/17 @ 1445

## 2017-04-06 NOTE — Progress Notes (Signed)
Pre visit review using our clinic review tool, if applicable. No additional management support is needed unless otherwise documented below in the visit note. 

## 2017-04-06 NOTE — Patient Instructions (Signed)
[name] [name] , Thank you for taking time to come for your Medicare Wellness Visit. I appreciate your ongoing commitment to your health goals. Please review the following plan we discussed and let me know if I can assist you in the future.   These are the goals we discussed: Goals    . DIET - INCREASE WATER INTAKE     Starting 04/06/2017, I will continue to drink at least 10 glasses of water daily.        This is a list of the screening recommended for you and due dates:  Health Maintenance  Topic Date Due  . Tetanus Vaccine  03/30/2013  . Flu Shot  06/27/2017*  . Pneumonia vaccines  Completed  *Topic was postponed. The date shown is not the original due date.   Preventive Care for Adults  A healthy lifestyle and preventive care can promote health and wellness. Preventive health guidelines for adults include the following key practices.  . A routine yearly physical is a good way to check with your health care provider about your health and preventive screening. It is a chance to share any concerns and updates on your health and to receive a thorough exam.  . Visit your dentist for a routine exam and preventive care every 6 months. Brush your teeth twice a day and floss once a day. Good oral hygiene prevents tooth decay and gum disease.  . The frequency of eye exams is based on your age, health, family medical history, use  of contact lenses, and other factors. Follow your health care provider's recommendations for frequency of eye exams.  . Eat a healthy diet. Foods like vegetables, fruits, whole grains, low-fat dairy products, and lean protein foods contain the nutrients you need without too many calories. Decrease your intake of foods high in solid fats, added sugars, and salt. Eat the right amount of calories for you. Get information about a proper diet from your health care provider, if necessary.  . Regular physical exercise is one of the most important things you can do for your  health. Most adults should get at least 150 minutes of moderate-intensity exercise (any activity that increases your heart rate and causes you to sweat) each week. In addition, most adults need muscle-strengthening exercises on 2 or more days a week.  Silver Sneakers may be a benefit available to you. To determine eligibility, you may visit the website: www.silversneakers.com or contact program at (830) 538-5640 Mon-Fri between 8AM-8PM.   . Maintain a healthy weight. The body mass index (BMI) is a screening tool to identify possible weight problems. It provides an estimate of body fat based on height and weight. Your health care provider can find your BMI and can help you achieve or maintain a healthy weight.   For adults 20 years and older: ? A BMI below 18.5 is considered underweight. ? A BMI of 18.5 to 24.9 is normal. ? A BMI of 25 to 29.9 is considered overweight. ? A BMI of 30 and above is considered obese.   . Maintain normal blood lipids and cholesterol levels by exercising and minimizing your intake of saturated fat. Eat a balanced diet with plenty of fruit and vegetables. Blood tests for lipids and cholesterol should begin at age 36 and be repeated every 5 years. If your lipid or cholesterol levels are high, you are over 50, or you are at high risk for heart disease, you may need your cholesterol levels checked more frequently. Ongoing high lipid and  cholesterol levels should be treated with medicines if diet and exercise are not working.  . If you smoke, find out from your health care provider how to quit. If you do not use tobacco, please do not start.  . If you choose to drink alcohol, please do not consume more than 2 drinks per day. One drink is considered to be 12 ounces (355 mL) of beer, 5 ounces (148 mL) of wine, or 1.5 ounces (44 mL) of liquor.  . If you are 58-85 years old, ask your health care provider if you should take aspirin to prevent strokes.  . Use sunscreen. Apply  sunscreen liberally and repeatedly throughout the day. You should seek shade when your shadow is shorter than you. Protect yourself by wearing long sleeves, pants, a wide-brimmed hat, and sunglasses year round, whenever you are outdoors.  . Once a month, do a whole body skin exam, using a mirror to look at the skin on your back. Tell your health care provider of new moles, moles that have irregular borders, moles that are larger than a pencil eraser, or moles that have changed in shape or color.

## 2017-04-06 NOTE — Progress Notes (Signed)
Subjective:   [name] [name] is a 84 y.o. male who presents for Medicare Annual/Subsequent preventive examination.  Review of Systems:  N/A Cardiac Risk Factors include: advanced age (>75men, >26 women);male gender;dyslipidemia;hypertension     Objective:    Vitals: BP 100/60 (BP Location: Right Arm, Patient Position: Sitting, Cuff Size: Normal)   Pulse 65   Temp (!) 97.5 F (36.4 C) (Oral)   Ht 5' 8.5" (1.74 m) Comment: no shoes  Wt 141 lb 12 oz (64.3 kg)   SpO2 97%   BMI 21.24 kg/m   Body mass index is 21.24 kg/m.  Advanced Directives 04/06/2017 01/29/2017 01/22/2017 11/06/2016 02/24/2016  Does Patient Have a Medical Advance Directive? Yes Yes Yes No Yes  Type of Paramedic of [place];Living will [place];Living will [place];Living will - [place];Living will  Does patient want to make changes to medical advance directive? - No - Patient declined - - No - Patient declined  Copy of [place] in Chart? Yes No - copy requested - - No - copy requested  Would patient like information on creating a medical advance directive? - - - Yes (ED - Information included in AVS) -    Tobacco Social History   Tobacco Use  Smoking Status Never Smoker  Smokeless Tobacco Never Used     Counseling given: No   Clinical Intake:  Pre-visit preparation completed: Yes  Pain : No/denies pain Pain Score: 0-No pain     Nutritional Status: BMI of 19-24  Normal Nutritional Risks: None Diabetes: No  How often do you need to have someone help you when you read instructions, pamphlets, or other written materials from your doctor or pharmacy?: 1 - Never What is the last grade level you completed in school?: Bachelor degree  Interpreter Needed?: No  Comments: pt and daughter live together Information entered by :: LPinson, LPN  Past Medical History:  Diagnosis Date  . Cancer ([place])     . Cardiac pacemaker in situ 07/2005   symptomatic bradycardia  . Diverticulosis of colon (without mention of hemorrhage)   . 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 GERD (gastroesophageal reflux disease)   . GI hemorrhage   . Hyperlipidemia   . Hypertension   . Lower extremity edema   . Neck arthritis, Severe, multi-level 06/09/2013  . Paroxysmal atrial fibrillation ([name])    s/p failed ablation  . Peptic ulcer, unspecified site, unspecified as acute or chronic, without mention of hemorrhage, perforation, or obstruction 1975  . Personal history of malignant neoplasm of prostate 12/2000   5 weeks radiation, radiation seeds  . Radiation proctitis   . Sinoatrial node dysfunction ([name])   . Unspecified glaucoma(365.9)   . Unspecified hypothyroidism   . Vitamin B12 deficiency    Past Surgical History:  Procedure Laterality Date  . CARDIAC CATHETERIZATION    . CARDIAC ELECTROPHYSIOLOGY [place] AND ABLATION  2001, 2003   failure  . CATARACT EXTRACTION    . CYSTOSCOPY WITH URETHRAL DILATATION N/A 01/29/2017   Procedure: CYSTOSCOPY WITH URETHRAL DILATATION;  Surgeon: Abbie Sons, MD;  Location: [facility] ORS;  Service: Urology;  Laterality: N/A;  . EP IMPLANTABLE DEVICE N/A 02/24/2016   Procedure: PPM Generator Changeout;  Surgeon: Deboraha Sprang, MD;  Location: [facility];  Service: Cardiovascular;  Laterality: N/A;  . gastric ulcer surgery    .  HERNIA REPAIR  1994, 2001, 2003   s/p drainage and complications, 05/863, 09/8467 mesh removal  . INSERT / REPLACE / REMOVE PACEMAKER  07/2005   symptomatic bradycardia  . kidney stones    . NOSE SURGERY     cancer removed   . PROSTATE SURGERY    . SKIN CANCER DESTRUCTION    . TOOTH EXTRACTION     Family History  Problem Relation Age of Onset  . Breast cancer Mother   . Bone cancer Father   . Alcohol abuse Unknown   . Coronary artery disease Unknown   . Dementia  Unknown   . Diabetes Unknown    Social History   Socioeconomic History  . Marital status: Widowed    Spouse name: None  . Number of children: None  . Years of education: None  . Highest education level: None  Social Needs  . Financial resource strain: None  . Food insecurity - worry: None  . Food insecurity - inability: None  . Transportation needs - medical: None  . Transportation needs - non-medical: None  Occupational History  . None  Tobacco Use  . Smoking status: Never Smoker  . Smokeless tobacco: Never Used  Substance and Sexual Activity  . Alcohol use: No  . Drug use: No  . Sexual activity: None  Other Topics Concern  . None  Social History Narrative  . None    Outpatient Encounter Medications as of 04/06/2017  Medication Sig  . acetaminophen (TYLENOL) 500 MG tablet Take 500 mg by mouth every 6 (six) hours as needed for moderate pain or headache.  . alendronate (FOSAMAX) 70 MG tablet TAKE 1 TABLET BY MOUTH EVERY 7 DAYS. TAKE WITH A FULL GLASS OF WATER ON AN EMPTY STOMACH  . amiodarone (PACERONE) 200 MG tablet Take 0.5 tablets (100 mg total) by mouth daily.  . clindamycin (CLEOCIN) 300 MG capsule Take 600 mg by mouth See admin instructions. Take 600 mg by mouth 1 hour prior to dental procedure  . Cyanocobalamin (VITAMIN B-12 IJ) Inject 1,000 mcg as directed every 30 (thirty) days.   Marland Kitchen docusate sodium (COLACE) 50 MG capsule Take 50 mg by mouth 2 (two) times daily as needed for mild constipation.  . dorzolamide-timolol (COSOPT) 22.3-6.8 MG/ML ophthalmic solution Place 1 drop into both eyes 2 (two) times daily.    . ferrous sulfate 325 (65 FE) MG EC tablet Take 325 mg by mouth daily with breakfast.    . levothyroxine (SYNTHROID, LEVOTHROID) 100 MCG tablet Take 1 tablet (100 mcg total) by mouth daily before breakfast. Please schedule appt with Dr Caryl Comes for further refills. Call [phone].  Marland Kitchen loperamide (IMODIUM) 2 MG capsule Take 2 mg by mouth as needed for diarrhea or  loose stools.  . Multiple Vitamin (MULTIVITAMIN) tablet Take 1 tablet by mouth daily.    . pantoprazole (PROTONIX) 40 MG tablet Take 1 tablet (40 mg total) by mouth daily.  . polyethylene glycol (MIRALAX / GLYCOLAX) packet Take 17 g by mouth daily as needed.  . pravastatin (PRAVACHOL) 40 MG tablet Take 0.5 tablets (20 mg total) by mouth daily. (Patient taking differently: Take 20 mg by mouth at bedtime. )  . protein supplement (RESOURCE BENEPROTEIN) POWD Take 1 scoop by mouth 3 (three) times daily with meals.  . simethicone (MYLICON) 440 MG chewable tablet Chew 125 mg by mouth every 6 (six) hours as needed for flatulence.  . tamsulosin (FLOMAX) 0.4 MG CAPS capsule Take 1 capsule (0.4 mg total) daily by  mouth.  . torsemide (DEMADEX) 20 MG tablet Take 1 tablet (20 mg total) by mouth daily as needed (for fluid).  . warfarin (COUMADIN) 4 MG tablet Take as directed by coumadin clinic (Patient taking differently: Take 1-2 mg by mouth See admin instructions. Take 1 mg by mouth daily on Sundays, Tuesdays, Thursdays, and Friday. Take 2 mg by mouth daily on Monday, Wednesday, and Saturdays.)  . Wheat Dextrin (BENEFIBER DRINK MIX PO) Take 1 scoop by mouth daily.   . [EXPIRED] cyanocobalamin ((VITAMIN B-12)) injection 1,000 mcg    No facility-administered encounter medications on file as of 04/06/2017.     Activities of Daily Living In your present state of health, do you have any difficulty performing the following activities: 04/06/2017 01/22/2017  Hearing? Tempie Donning  Vision? N N  Difficulty concentrating or making decisions? N N  Walking or climbing stairs? Y -  Dressing or bathing? N N  Doing errands, shopping? N N  Preparing Food and eating ? N -  Using the Toilet? N -  In the past six months, have you accidently leaked urine? Y -  Comment wears brief; may have leakage at night -  Do you have problems with loss of bowel control? N -  Managing your Medications? N -  Managing your Finances? N -    Housekeeping or managing your Housekeeping? N -  Some recent data might be hidden    Patient Care Team: Owens Loffler, MD as PCP - General (Family Medicine)   Assessment:   This is a routine wellness examination for [place].   Hearing Screening   125Hz  250Hz  500Hz  1000Hz  2000Hz  3000Hz  4000Hz  6000Hz  8000Hz   Right ear:   0 0 0  0    Left ear:   0 0 0  0    Vision Screening Comments: Last vision exam in Fall 2018 @ [facility]; Future appt scheduled Apr 27, 2017   Exercise Activities and Dietary recommendations Current Exercise Habits: The patient does not participate in regular exercise at present, Exercise limited by: None identified  Goals    . DIET - INCREASE WATER INTAKE     Starting 04/06/2017, I will continue to drink at least 10 glasses of water daily.        Fall Risk Fall Risk  04/06/2017 03/18/2016 01/24/2015 12/14/2013  Falls in the past year? Yes No No No  Comment tripped over rug - - -  Number falls in past yr: 1 - - -  Injury with Fall? No - - -   Depression Screen PHQ 2/9 Scores 04/06/2017 03/18/2016 01/24/2015 12/14/2013  PHQ - 2 Score 0 0 0 0  PHQ- 9 Score 0 - - -    Cognitive Function MMSE - Mini Mental State Exam 04/06/2017  Orientation to time 5  Orientation to Place 5  Registration 3  Attention/ Calculation 0  Recall 3  Language- name 2 objects 0  Language- repeat 1  Language- follow 3 step command 3  Language- read & follow direction 0  Write a sentence 0  Copy design 0  Total score 20     PLEASE NOTE: A Mini-Cog screen was completed. Maximum score is 20. A value of 0 denotes this part of [place] MMSE was not completed or the patient failed this part of the Mini-Cog screening.   Mini-Cog Screening Orientation to Time - Max 5 pts Orientation to Place - Max 5 pts Registration - Max 3 pts Recall - Max 3 pts Language Repeat -  Max 1 pts Language Follow 3 Step Command - Max 3 pts     Immunization History  Administered Date(s)  Administered  . H1N1 05/09/2008  . Influenza Split 02/10/2011  . Influenza Whole 01/15/2009, 01/13/2010  . Influenza, High Dose Seasonal PF 12/21/2012  . Influenza,inj,Quad PF,6+ Mos 01/08/2014, 01/25/2015  . Pneumococcal Conjugate-13 12/14/2013  . Pneumococcal Polysaccharide-23 03/31/2003, 03/30/2005  . Td 03/31/2003, 04/06/2017  . Zoster 02/06/2008   Screening Tests Health Maintenance  Topic Date Due  . INFLUENZA VACCINE  06/27/2017 (Originally 10/28/2016)  . TETANUS/TDAP  04/07/2027  . PNA vac Low Risk Adult  Completed      Plan:     I have personally reviewed, addressed, and noted the following in the patient's chart:  A. Medical and social history B. Use of alcohol, tobacco or illicit drugs  C. Current medications and supplements D. Functional ability and status E.  Nutritional status F.  Physical activity G. Advance directives H. List of other physicians I.  Hospitalizations, surgeries, and ER visits in previous 12 months J.  [place] to include hearing, vision, cognitive, depression L. Referrals and appointments - none  In addition, I have reviewed and discussed with patient certain preventive protocols, quality metrics, and best practice recommendations. A written personalized care plan for preventive services as well as general preventive health recommendations were provided to patient.  See attached scanned questionnaire for additional information.   Signed,   Lindell Noe, MHA, BS, LPN Health Coach

## 2017-04-07 ENCOUNTER — Other Ambulatory Visit: Payer: Self-pay | Admitting: Family Medicine

## 2017-04-07 NOTE — Progress Notes (Signed)
I reviewed health advisor's note, was available for consultation, and agree with documentation and plan.  

## 2017-04-08 ENCOUNTER — Other Ambulatory Visit: Payer: Self-pay

## 2017-04-08 MED ORDER — AMIODARONE HCL 200 MG PO TABS: 100.0000 mg | ORAL_TABLET | Freq: Every day | ORAL | 0 refills | Status: DC

## 2017-04-08 NOTE — Telephone Encounter (Signed)
Requested Prescriptions   Signed Prescriptions Disp Refills  . amiodarone (PACERONE) 200 MG tablet 30 tablet 0    Sig: Take 0.5 tablets (100 mg total) by mouth daily.    Authorizing Provider: Deboraha Sprang    Ordering User: Janan Ridge

## 2017-04-12 ENCOUNTER — Ambulatory Visit (INDEPENDENT_AMBULATORY_CARE_PROVIDER_SITE_OTHER): Payer: Medicare Other | Admitting: Family Medicine

## 2017-04-12 ENCOUNTER — Other Ambulatory Visit: Payer: Self-pay

## 2017-04-12 VITALS — BP 100/60 | HR 85 | Temp 98.3°F | Ht 68.5 in | Wt 140.2 lb

## 2017-04-12 DIAGNOSIS — I480 Paroxysmal atrial fibrillation: Secondary | ICD-10-CM

## 2017-04-12 DIAGNOSIS — C61 Malignant neoplasm of prostate: Secondary | ICD-10-CM | POA: Diagnosis not present

## 2017-04-12 DIAGNOSIS — N183 Chronic kidney disease, stage 3 unspecified: Secondary | ICD-10-CM

## 2017-04-12 DIAGNOSIS — E038 Other specified hypothyroidism: Secondary | ICD-10-CM | POA: Diagnosis not present

## 2017-04-12 DIAGNOSIS — I10 Essential (primary) hypertension: Secondary | ICD-10-CM | POA: Diagnosis not present

## 2017-04-12 DIAGNOSIS — E538 Deficiency of other specified B group vitamins: Secondary | ICD-10-CM

## 2017-04-12 NOTE — Progress Notes (Signed)
Dr. Frederico Hamman T. Zaire Vanbuskirk, MD, [facility] [place], [address] Phone: [phone] Fax: [phone]  04/12/2017  Patient: [name] [name], MRN: [id number], DOB: [birth year], 84 y.o.  Primary Physician:  Owens Loffler, MD   Chief Complaint  Patient presents with  . Annual Exam    Part 2   Subjective:   [name] [name] is a 84 y.o. very pleasant male patient who presents with the following:  Very pleasant gentleman who at 84 is doing quite well and still living alone.  He is driving and fully functional.  He not had any significant falls, he still goes out to lunch every day with his lady friend.  Does have a pacemaker and is in paroxysmal A. fib.  Takes Coumadin chronically.  He also has arthritis and degenerative disc disease virtually everywhere.  He also has hypothyroidism which has been pretty stable.  Historically he had some prostate cancer and radiation proctitis.  He was having some issues earlier in the year clearing his bladder, but that is resolved.  Subsequently he is having some leakage now.  Urine leakage in the last year.     Past Medical History, Surgical History, Social History, Family History, Problem List, Medications, and Allergies have been reviewed and updated if relevant.  Patient Active Problem List   Diagnosis Date Noted  . PACEMAKER, PERMANENT 08/07/2008    Priority: Medium  . PAROXYSMAL ATRIAL FIBRILLATION 02/06/2008    Priority: Medium  . DDD (degenerative disc disease), cervical, Severe 06/09/2013  . Neck arthritis, Severe, multi-level 06/09/2013  . Sinoatrial node dysfunction ([name])   . Long term (current) use of anticoagulants 06/24/2010  . IRON DEFICIENCY 04/17/2010  . GLAUCOMA 08/07/2008  . GERD 08/07/2008  . Vitamin B 12 deficiency 07/19/2008  . PEPTIC ULCER DISEASE 07/18/2008  . RADIATION PROCTITIS 07/18/2008  . DIVERTICULOSIS, COLON 07/18/2008  . Prostate cancer, in  remission 07/18/2008  . Essential hypertension 05/09/2008  . Hyperlipidemia 02/06/2008  . Hypothyroidism 12/01/2007  . HERN UNS SITE ABD CAV W/O MENTION OBST/GANGREN 12/01/2007    Past Medical History:  Diagnosis Date  . Cancer ([place])   . Cardiac pacemaker in situ 07/2005   symptomatic bradycardia  . Diverticulosis of colon (without mention of hemorrhage)   . 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 GERD (gastroesophageal reflux disease)   . GI hemorrhage   . Hyperlipidemia   . Hypertension   . Lower extremity edema   . Neck arthritis, Severe, multi-level 06/09/2013  . Paroxysmal atrial fibrillation ([name])    s/p failed ablation  . Peptic ulcer, unspecified site, unspecified as acute or chronic, without mention of hemorrhage, perforation, or obstruction 1975  . Personal history of malignant neoplasm of prostate 12/2000   5 weeks radiation, radiation seeds  . Radiation proctitis   . Sinoatrial node dysfunction ([name])   . Unspecified glaucoma(365.9)   . Unspecified hypothyroidism   . Vitamin B12 deficiency     Past Surgical History:  Procedure Laterality Date  . CARDIAC CATHETERIZATION    . CARDIAC ELECTROPHYSIOLOGY [place] AND ABLATION  2001, 2003   failure  . CATARACT EXTRACTION    . CYSTOSCOPY WITH URETHRAL DILATATION N/A 01/29/2017   Procedure: CYSTOSCOPY WITH URETHRAL DILATATION;  Surgeon: Abbie Sons, MD;  Location: [facility];  Service: Urology;  Laterality: N/A;  . EP IMPLANTABLE DEVICE N/A 02/24/2016   Procedure:  PPM Generator Changeout;  Surgeon: Deboraha Sprang, MD;  Location: [facility];  Service: Cardiovascular;  Laterality: N/A;  . gastric ulcer surgery    . HERNIA REPAIR  1994, 2001, 2003   s/p drainage and complications, 09/9890, 03/1939 mesh removal  . INSERT / REPLACE / REMOVE PACEMAKER  07/2005   symptomatic bradycardia  . kidney stones    . NOSE SURGERY     cancer removed   .  PROSTATE SURGERY    . SKIN CANCER DESTRUCTION    . TOOTH EXTRACTION      Social History   Socioeconomic History  . Marital status: Widowed    Spouse name: Not on file  . Number of children: Not on file  . Years of education: Not on file  . Highest education level: Not on file  Social Needs  . Financial resource strain: Not on file  . Food insecurity - worry: Not on file  . Food insecurity - inability: Not on file  . Transportation needs - medical: Not on file  . Transportation needs - non-medical: Not on file  Occupational History  . Not on file  Tobacco Use  . Smoking status: Never Smoker  . Smokeless tobacco: Never Used  Substance and Sexual Activity  . Alcohol use: No  . Drug use: No  . Sexual activity: Not on file  Other Topics Concern  . Not on file  Social History Narrative  . Not on file    Family History  Problem Relation Age of Onset  . Breast cancer Mother   . Bone cancer Father   . Alcohol abuse Unknown   . Coronary artery disease Unknown   . Dementia Unknown   . Diabetes Unknown     Allergies  Allergen Reactions  . Penicillins Rash and Other (See Comments)    Has patient had a PCN reaction causing immediate rash, facial/tongue/throat swelling, SOB or lightheadedness with hypotension: {no Has patient had a PCN reaction causing severe rash involving mucus membranes or skin necrosis: {no Has patient had a PCN reaction that required hospitalization {no Has patient had a PCN reaction occurring within the last 10 years: {no If all of the above answers are "NO", then may proceed with Cephalosporin use.    Medication list reviewed and updated in full in [facility].   GEN: No acute illnesses, no fevers, chills. GI: No n/v/d, eating normally Pulm: No SOB Interactive and getting along well at home.  Otherwise, ROS is as per the HPI.  Objective:   BP 100/60   Pulse 85   Temp 98.3 F (36.8 C) (Oral)   Ht 5' 8.5" (1.74 m)   Wt 140 lb 4 oz  (63.6 kg)   BMI 21.01 kg/m   GEN: WDWN, NAD, Non-toxic, A & O x 3 HEENT: Atraumatic, Normocephalic. Neck supple. No masses, No LAD. Ears and Nose: No external deformity. CV: irreg, irreg, No M/G/R. No JVD. No thrill. No extra heart sounds. PULM: CTA B, no wheezes, crackles, rhonchi. No retractions. No resp. distress. No accessory muscle use. EXTR: No c/c/tr edema LE NEURO Normal gait.  PSYCH: Normally interactive. Conversant. Not depressed or anxious appearing.  Calm demeanor.   Laboratory and Imaging Data: Results for orders placed or performed in visit on 04/06/17  CBC with Differential/Platelet  Result Value Ref Range   WBC 6.1 4.0 - 10.5 K/uL   RBC 3.28 (L) 4.22 - 5.81 Mil/uL   Hemoglobin 10.5 (L) 13.0 - 17.0 g/dL  HCT 32.4 (L) 39.0 - 52.0 %   MCV 98.9 78.0 - 100.0 fl   MCHC 32.5 30.0 - 36.0 g/dL   RDW 13.6 11.5 - 15.5 %   Platelets 212.0 150.0 - 400.0 K/uL   Neutrophils Relative % 54.0 43.0 - 77.0 %   Lymphocytes Relative 32.2 12.0 - 46.0 %   Monocytes Relative 9.7 3.0 - 12.0 %   Eosinophils Relative 3.2 0.0 - 5.0 %   Basophils Relative 0.9 0.0 - 3.0 %   Neutro Abs 3.3 1.4 - 7.7 K/uL   Lymphs Abs 2.0 0.7 - 4.0 K/uL   Monocytes Absolute 0.6 0.1 - 1.0 K/uL   Eosinophils Absolute 0.2 0.0 - 0.7 K/uL   Basophils Absolute 0.1 0.0 - 0.1 K/uL  Hepatic Function Panel  Result Value Ref Range   Total Bilirubin 0.7 0.2 - 1.2 mg/dL   Bilirubin, Direct 0.2 0.0 - 0.3 mg/dL   Alkaline Phosphatase 97 39 - 117 U/L   AST 21 0 - 37 U/L   ALT 15 0 - 53 U/L   Total Protein 6.6 6.0 - 8.3 g/dL   Albumin 3.4 (L) 3.5 - 5.2 g/dL  Basic Metabolic Panel  Result Value Ref Range   Sodium 139 135 - 145 mEq/L   Potassium 4.1 3.5 - 5.1 mEq/L   Chloride 108 96 - 112 mEq/L   CO2 28 19 - 32 mEq/L   Glucose, Bld 94 70 - 99 mg/dL   BUN 32 (H) 6 - 23 mg/dL   Creatinine, Ser 1.60 (H) 0.40 - 1.50 mg/dL   Calcium 8.8 8.4 - 10.5 mg/dL   GFR 43.18 (L) >60.00 mL/min  TSH  Result Value Ref Range    TSH 0.30 (L) 0.35 - 4.50 uIU/mL     Assessment and Plan:   Essential hypertension  Paroxysmal atrial fibrillation ([name])  Other specified hypothyroidism  Prostate cancer, in remission  Vitamin B 12 deficiency  CKD (chronic kidney disease) stage 3, GFR 30-59 ml/min ([name])  He is globally doing very well and highly functional at 89.  He does have some ongoing health issues such as A. fib, pacemaker, chronic Coumadin use chronic kidney disease stage III, chronic anemia, and relatively diffuse arthritis, but is he is getting along very well for his age and is having seemingly no loss of memory.  Follow-up: Return in about 6 months (around 10/10/2017).  Signed,  [name]. Danee Soller, MD   Allergies as of 04/12/2017      Reactions   Penicillins Rash, Other (See Comments)   Has patient had a PCN reaction causing immediate rash, facial/tongue/throat swelling, SOB or lightheadedness with hypotension: {no Has patient had a PCN reaction causing severe rash involving mucus membranes or skin necrosis: {no Has patient had a PCN reaction that required hospitalization {no Has patient had a PCN reaction occurring within the last 10 years: {no If all of the above answers are "NO", then may proceed with Cephalosporin use.      Medication List        Accurate as of 04/12/17 11:59 PM. Always use your most recent med list.          acetaminophen 500 MG tablet Commonly known as:  TYLENOL Take 500 mg by mouth every 6 (six) hours as needed for moderate pain or headache.   alendronate 70 MG tablet Commonly known as:  FOSAMAX TAKE 1 TABLET BY MOUTH EVERY 7 DAYS. TAKE WITH A FULL GLASS OF WATER ON AN EMPTY STOMACH  amiodarone 200 MG tablet Commonly known as:  PACERONE Take 0.5 tablets (100 mg total) by mouth daily.   BENEFIBER DRINK MIX PO Take 1 scoop by mouth daily.   clindamycin 300 MG capsule Commonly known as:  CLEOCIN Take 600 mg by mouth See admin instructions. Take 600 mg by  mouth 1 hour prior to dental procedure   docusate sodium 50 MG capsule Commonly known as:  COLACE Take 50 mg by mouth 2 (two) times daily as needed for mild constipation.   dorzolamide-timolol 22.3-6.8 MG/ML ophthalmic solution Commonly known as:  COSOPT Place 1 drop into both eyes 2 (two) times daily.   ferrous sulfate 325 (65 FE) MG EC tablet Take 325 mg by mouth daily with breakfast.   levothyroxine 100 MCG tablet Commonly known as:  SYNTHROID, LEVOTHROID Take 1 tablet (100 mcg total) by mouth daily before breakfast. Please schedule appt with Dr Caryl Comes for further refills. Call [phone].   loperamide 2 MG capsule Commonly known as:  IMODIUM Take 2 mg by mouth as needed for diarrhea or loose stools.   multivitamin tablet Take 1 tablet by mouth daily.   pantoprazole 40 MG tablet Commonly known as:  PROTONIX Take 1 tablet (40 mg total) by mouth daily.   polyethylene glycol packet Commonly known as:  MIRALAX / GLYCOLAX Take 17 g by mouth daily as needed.   pravastatin 40 MG tablet Commonly known as:  PRAVACHOL TAKE ONE-HALF TABLET BY MOUTH DAILY   protein supplement Powd Take 1 scoop by mouth 3 (three) times daily with meals.   simethicone 125 MG chewable tablet Commonly known as:  MYLICON Chew 226 mg by mouth every 6 (six) hours as needed for flatulence.   tamsulosin 0.4 MG Caps capsule Commonly known as:  FLOMAX Take 1 capsule (0.4 mg total) daily by mouth.   torsemide 20 MG tablet Commonly known as:  DEMADEX Take 1 tablet (20 mg total) by mouth daily as needed (for fluid).   VITAMIN B-12 IJ Inject 1,000 mcg as directed every 30 (thirty) days.   warfarin 4 MG tablet Commonly known as:  COUMADIN Take as directed by the anticoagulation clinic. If you are unsure how to take this medication, talk to your nurse or doctor. Original instructions:  Take as directed by coumadin clinic

## 2017-04-13 ENCOUNTER — Encounter: Payer: Self-pay | Admitting: Family Medicine

## 2017-04-13 DIAGNOSIS — N183 Chronic kidney disease, stage 3 unspecified: Secondary | ICD-10-CM

## 2017-04-13 DIAGNOSIS — N1832 Chronic kidney disease, stage 3b: Secondary | ICD-10-CM | POA: Insufficient documentation

## 2017-04-13 HISTORY — DX: Chronic kidney disease, stage 3 unspecified: N18.30

## 2017-04-14 ENCOUNTER — Other Ambulatory Visit: Payer: Self-pay | Admitting: Internal Medicine

## 2017-04-14 ENCOUNTER — Ambulatory Visit (INDEPENDENT_AMBULATORY_CARE_PROVIDER_SITE_OTHER): Payer: Medicare Other

## 2017-04-14 DIAGNOSIS — I480 Paroxysmal atrial fibrillation: Secondary | ICD-10-CM | POA: Diagnosis not present

## 2017-04-14 DIAGNOSIS — Z5181 Encounter for therapeutic drug level monitoring: Secondary | ICD-10-CM

## 2017-04-14 LAB — POCT INR: INR: 2.7

## 2017-04-14 NOTE — Patient Instructions (Signed)
Please continue taking 1mg  daily except 2mg s on Mondays, Wednesdays and Saturdays.   Recheck in 4 weeks.

## 2017-04-14 NOTE — Telephone Encounter (Signed)
For future refills get from PMD

## 2017-04-14 NOTE — Telephone Encounter (Signed)
Dr. Olin Pia pt's pharmacy is requesting a refill on levothyroxine 100 mg tablet. Would you like to refill this medication? Please advise

## 2017-04-16 ENCOUNTER — Encounter: Payer: Self-pay | Admitting: Urology

## 2017-04-16 ENCOUNTER — Ambulatory Visit (INDEPENDENT_AMBULATORY_CARE_PROVIDER_SITE_OTHER): Payer: Medicare Other | Admitting: Urology

## 2017-04-16 VITALS — BP 117/75 | HR 82 | Resp 14 | Ht 68.0 in | Wt 141.0 lb

## 2017-04-16 DIAGNOSIS — T191XXD Foreign body in bladder, subsequent encounter: Secondary | ICD-10-CM | POA: Diagnosis not present

## 2017-04-16 NOTE — Progress Notes (Signed)
04/16/2017 1:29 PM   [name] [name] May 28, 1922 [id number]  Referring provider: Owens Loffler, MD [address] [place], [place] [address]  Chief Complaint  Patient presents with  . Follow-up   Urologic problem list:  1.  Bladder foreign body- evaluated for microhematuria in 2016 and found to have a bladder wall calcification.  He underwent cystolitholapaxy with intraoperative findings of encrusted hernia mesh within the bladder.  Due to an extreme anterior location the mesh could not be completely removed endoscopically.  He was asymptomatic and declined further treatment.  2. Clinical stage T1c adenocarcinoma of the prostate in 2002  treated with radiation therapy.  He elected to discontinue PSA testing   HPI: 84 year old male followed for the above problem list.  He was [facility] by [facility] at [facility] in August 2018.  At that time he had developed some increasing frequency, urgency, dysuria and sensation of incomplete emptying.  His PVR was 71 mL.  He was started on antibiotics and although his urine culture grew mixed flora he had resolution of his symptoms after antibiotic treatment.  He currently has no complaints.  He is voiding without problems.  Denies recurrent gross hematuria.   PMH: Past Medical History:  Diagnosis Date  . Cancer ([place])   . Cardiac pacemaker in situ 07/2005   symptomatic bradycardia  . CKD (chronic kidney disease) stage 3, GFR 30-59 ml/min ([name]) 04/13/2017  . Diverticulosis of colon (without mention of hemorrhage)   . 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 GERD (gastroesophageal reflux disease)   . GI hemorrhage   . Hyperlipidemia   . Hypertension   . Lower extremity edema   . Neck arthritis, Severe, multi-level 06/09/2013  . Paroxysmal atrial fibrillation ([name])    s/p failed ablation  . Peptic ulcer, unspecified site, unspecified as acute or chronic, without mention of  hemorrhage, perforation, or obstruction 1975  . Personal history of malignant neoplasm of prostate 12/2000   5 weeks radiation, radiation seeds  . Radiation proctitis   . Sinoatrial node dysfunction ([name])   . Unspecified glaucoma(365.9)   . Unspecified hypothyroidism   . Vitamin B12 deficiency     Surgical History: Past Surgical History:  Procedure Laterality Date  . CARDIAC CATHETERIZATION    . CARDIAC ELECTROPHYSIOLOGY [place] AND ABLATION  2001, 2003   failure  . CATARACT EXTRACTION    . CYSTOSCOPY WITH URETHRAL DILATATION N/A 01/29/2017   Procedure: CYSTOSCOPY WITH URETHRAL DILATATION;  Surgeon: Abbie Sons, MD;  Location: [facility];  Service: Urology;  Laterality: N/A;  . EP IMPLANTABLE DEVICE N/A 02/24/2016   Procedure: PPM Generator Changeout;  Surgeon: Deboraha Sprang, MD;  Location: [facility];  Service: Cardiovascular;  Laterality: N/A;  . gastric ulcer surgery    . HERNIA REPAIR  1994, 2001, 2003   s/p drainage and complications, 09/3530, 11/9240 mesh removal  . INSERT / REPLACE / REMOVE PACEMAKER  07/2005   symptomatic bradycardia  . kidney stones    . NOSE SURGERY     cancer removed   . PROSTATE SURGERY    . SKIN CANCER DESTRUCTION    . TOOTH EXTRACTION      Home Medications:  Allergies as of 04/16/2017      Reactions   Penicillins Rash, Other (See Comments)   Has patient had a PCN reaction causing immediate rash, facial/tongue/throat swelling, SOB or lightheadedness with hypotension: {no  Has patient had a PCN reaction causing severe rash involving mucus membranes or skin necrosis: {no Has patient had a PCN reaction that required hospitalization {no Has patient had a PCN reaction occurring within the last 10 years: {no If all of the above answers are "NO", then may proceed with Cephalosporin use.      Medication List        Accurate as of 04/16/17  1:29 PM. Always use your most recent med list.          acetaminophen 500 MG tablet Commonly known  as:  TYLENOL Take 500 mg by mouth every 6 (six) hours as needed for moderate pain or headache.   alendronate 70 MG tablet Commonly known as:  FOSAMAX TAKE 1 TABLET BY MOUTH EVERY 7 DAYS. TAKE WITH A FULL GLASS OF WATER ON AN EMPTY STOMACH   amiodarone 200 MG tablet Commonly known as:  PACERONE Take 0.5 tablets (100 mg total) by mouth daily.   BENEFIBER DRINK MIX PO Take 1 scoop by mouth daily.   clindamycin 300 MG capsule Commonly known as:  CLEOCIN Take 600 mg by mouth See admin instructions. Take 600 mg by mouth 1 hour prior to dental procedure   docusate sodium 50 MG capsule Commonly known as:  COLACE Take 50 mg by mouth 2 (two) times daily as needed for mild constipation.   dorzolamide-timolol 22.3-6.8 MG/ML ophthalmic solution Commonly known as:  COSOPT Place 1 drop into both eyes 2 (two) times daily.   ferrous sulfate 325 (65 FE) MG EC tablet Take 325 mg by mouth daily with breakfast.   levothyroxine 100 MCG tablet Commonly known as:  SYNTHROID, LEVOTHROID TAKE 1 TABLET BY MOUTH DAILY BEFORE BREAKFAST   loperamide 2 MG capsule Commonly known as:  IMODIUM Take 2 mg by mouth as needed for diarrhea or loose stools.   multivitamin tablet Take 1 tablet by mouth daily.   pantoprazole 40 MG tablet Commonly known as:  PROTONIX Take 1 tablet (40 mg total) by mouth daily.   polyethylene glycol packet Commonly known as:  MIRALAX / GLYCOLAX Take 17 g by mouth daily as needed.   pravastatin 40 MG tablet Commonly known as:  PRAVACHOL TAKE ONE-HALF TABLET BY MOUTH DAILY   protein supplement Powd Take 1 scoop by mouth 3 (three) times daily with meals.   simethicone 125 MG chewable tablet Commonly known as:  MYLICON Chew 937 mg by mouth every 6 (six) hours as needed for flatulence.   tamsulosin 0.4 MG Caps capsule Commonly known as:  FLOMAX Take 1 capsule (0.4 mg total) daily by mouth.   torsemide 20 MG tablet Commonly known as:  DEMADEX Take 1 tablet (20 mg  total) by mouth daily as needed (for fluid).   VITAMIN B-12 IJ Inject 1,000 mcg as directed every 30 (thirty) days.   warfarin 4 MG tablet Commonly known as:  COUMADIN Take as directed by the anticoagulation clinic. If you are unsure how to take this medication, talk to your nurse or doctor. Original instructions:  Take as directed by coumadin clinic       Allergies:  Allergies  Allergen Reactions  . Penicillins Rash and Other (See Comments)    Has patient had a PCN reaction causing immediate rash, facial/tongue/throat swelling, SOB or lightheadedness with hypotension: {no Has patient had a PCN reaction causing severe rash involving mucus membranes or skin necrosis: {no Has patient had a PCN reaction that required hospitalization {no Has patient had a PCN reaction occurring  within the last 10 years: {no If all of the above answers are "NO", then may proceed with Cephalosporin use.    Family History: Family History  Problem Relation Age of Onset  . Breast cancer Mother   . Bone cancer Father   . Alcohol abuse Unknown   . Coronary artery disease Unknown   . Dementia Unknown   . Diabetes Unknown     Social History:  reports that  has never smoked. he has never used smokeless tobacco. He reports that he does not drink alcohol or use drugs.  ROS: UROLOGY Frequent Urination?: Yes Hard to postpone urination?: No Burning/pain with urination?: No Get up at night to urinate?: Yes Leakage of urine?: Yes Urine stream starts and stops?: No Trouble starting stream?: No Do you have to strain to urinate?: No Blood in urine?: Yes Urinary tract infection?: No Sexually transmitted disease?: No Injury to kidneys or bladder?: No Painful intercourse?: No Weak stream?: No Erection problems?: No Penile pain?: No  Gastrointestinal Nausea?: No Vomiting?: No Indigestion/heartburn?: No Diarrhea?: No Constipation?: No  Constitutional Fever: No Night sweats?: No Weight loss?:  No Fatigue?: No  Skin Skin rash/lesions?: No Itching?: No  Eyes Blurred vision?: No Double vision?: No  Ears/Nose/Throat Sore throat?: No Sinus problems?: No  Hematologic/Lymphatic Swollen glands?: No Easy bruising?: Yes  Cardiovascular Leg swelling?: Yes Chest pain?: No  Respiratory Cough?: No Shortness of breath?: No  Endocrine Excessive thirst?: No  Musculoskeletal Back pain?: No  Neurological Headaches?: No Dizziness?: No  Psychologic Depression?: No Anxiety?: No  Physical Exam: BP 117/75   Pulse 82   Resp 14   Ht 5\' 8"  (1.727 m)   Wt 141 lb (64 kg)   BMI 21.44 kg/m   Constitutional:  Alert and oriented, No acute distress. HEENT: [place] AT, moist mucus membranes.  Trachea midline, no masses. Cardiovascular: No clubbing, cyanosis, or edema. Respiratory: Normal respiratory effort, no increased work of breathing. GI: Abdomen is soft, nontender, nondistended, no abdominal masses GU: No CVA tenderness.  Skin: No rashes, bruises or suspicious lesions. Lymph: No cervical or inguinal adenopathy. Neurologic: Grossly intact, no focal deficits, moving all 4 extremities. Psychiatric: Normal mood and affect.  Laboratory Data: Lab Results  Component Value Date   WBC 6.1 04/06/2017   HGB 10.5 (L) 04/06/2017   HCT 32.4 (L) 04/06/2017   MCV 98.9 04/06/2017   PLT 212.0 04/06/2017    Lab Results  Component Value Date   CREATININE 1.60 (H) 04/06/2017    Lab Results  Component Value Date   HGBA1C 6.2 07/20/2008     Assessment & Plan:    1. Foreign body in bladder, subsequent encounter  He remains asymptomatic and declines further treatment.  Continue semiannual follow-up and prn visit for any change in symptoms.   Return in about 6 months (around 10/14/2017) for Recheck.  Abbie Sons, [facility] [address], [place], [place] [address] [phone]

## 2017-04-26 ENCOUNTER — Ambulatory Visit (INDEPENDENT_AMBULATORY_CARE_PROVIDER_SITE_OTHER): Payer: Medicare Other | Admitting: *Deleted

## 2017-04-26 DIAGNOSIS — I495 Sick sinus syndrome: Secondary | ICD-10-CM | POA: Diagnosis not present

## 2017-04-27 DIAGNOSIS — H353131 Nonexudative age-related macular degeneration, bilateral, early dry stage: Secondary | ICD-10-CM | POA: Diagnosis not present

## 2017-04-27 NOTE — Progress Notes (Signed)
Remote pacemaker transmission.   

## 2017-04-28 ENCOUNTER — Encounter: Payer: Self-pay | Admitting: Cardiology

## 2017-04-28 LAB — CUP PACEART REMOTE DEVICE CHECK
Battery Remaining Longevity: 105 mo
Battery Voltage: 3.02 V
Brady Statistic AP VS Percent: 84.65 %
Brady Statistic AS VS Percent: 9.29 %
Brady Statistic RV Percent Paced: 6.13 %
Date Time Interrogation Session: 20190128174707
Implantable Lead Implant Date: 20070525
Implantable Pulse Generator Implant Date: 20171127
Lead Channel Impedance Value: 380 Ohm
Lead Channel Impedance Value: 418 Ohm
Lead Channel Pacing Threshold Amplitude: 0.625 V
Lead Channel Pacing Threshold Amplitude: 0.75 V
Lead Channel Sensing Intrinsic Amplitude: 1.375 mV
Lead Channel Sensing Intrinsic Amplitude: 1.375 mV
Lead Channel Sensing Intrinsic Amplitude: 1.375 mV
Lead Channel Setting Pacing Amplitude: 2.5 V
Lead Channel Setting Sensing Sensitivity: 0.6 mV
MDC IDC LEAD IMPLANT DT: 20070525
MDC IDC LEAD LOCATION: 753859
MDC IDC LEAD LOCATION: 753860
MDC IDC MSMT LEADCHNL RA IMPEDANCE VALUE: 361 Ohm
MDC IDC MSMT LEADCHNL RA PACING THRESHOLD PULSEWIDTH: 0.4 ms
MDC IDC MSMT LEADCHNL RV IMPEDANCE VALUE: 361 Ohm
MDC IDC MSMT LEADCHNL RV PACING THRESHOLD PULSEWIDTH: 0.4 ms
MDC IDC MSMT LEADCHNL RV SENSING INTR AMPL: 1.375 mV
MDC IDC SET LEADCHNL RA PACING AMPLITUDE: 2 V
MDC IDC SET LEADCHNL RV PACING PULSEWIDTH: 0.4 ms
MDC IDC STAT BRADY AP VP PERCENT: 3.86 %
MDC IDC STAT BRADY AS VP PERCENT: 2.2 %
MDC IDC STAT BRADY RA PERCENT PACED: 86.94 %

## 2017-05-11 ENCOUNTER — Ambulatory Visit (INDEPENDENT_AMBULATORY_CARE_PROVIDER_SITE_OTHER): Payer: Medicare Other

## 2017-05-11 DIAGNOSIS — E538 Deficiency of other specified B group vitamins: Secondary | ICD-10-CM | POA: Diagnosis not present

## 2017-05-11 MED ORDER — CYANOCOBALAMIN 1000 MCG/ML IJ SOLN
1000.0000 ug | Freq: Once | INTRAMUSCULAR | Status: AC
  Administered 2017-05-11: 1000 ug via INTRAMUSCULAR

## 2017-05-12 ENCOUNTER — Ambulatory Visit (INDEPENDENT_AMBULATORY_CARE_PROVIDER_SITE_OTHER): Payer: Medicare Other

## 2017-05-12 DIAGNOSIS — I480 Paroxysmal atrial fibrillation: Secondary | ICD-10-CM

## 2017-05-12 DIAGNOSIS — Z5181 Encounter for therapeutic drug level monitoring: Secondary | ICD-10-CM | POA: Diagnosis not present

## 2017-05-12 LAB — POCT INR: INR: 3.9

## 2017-05-12 NOTE — Patient Instructions (Signed)
Please skip coumadin today, then continue taking 1mg  daily except 2mg s on Mondays, Wednesdays and Saturdays.  Please be consistent with your greens intake.   Recheck in 2 weeks.

## 2017-05-26 ENCOUNTER — Ambulatory Visit (INDEPENDENT_AMBULATORY_CARE_PROVIDER_SITE_OTHER): Payer: Medicare Other

## 2017-05-26 DIAGNOSIS — Z5181 Encounter for therapeutic drug level monitoring: Secondary | ICD-10-CM

## 2017-05-26 DIAGNOSIS — I480 Paroxysmal atrial fibrillation: Secondary | ICD-10-CM | POA: Diagnosis not present

## 2017-05-26 LAB — POCT INR: INR: 4

## 2017-05-26 NOTE — Patient Instructions (Signed)
Please skip coumadin today, then start new dosage of 1mg  daily except 2mg s on Mondays and Saturdays.  Please be consistent with your greens intake.   Recheck in 2 weeks.

## 2017-05-27 ENCOUNTER — Other Ambulatory Visit: Payer: Self-pay | Admitting: Urology

## 2017-06-02 ENCOUNTER — Other Ambulatory Visit: Payer: Self-pay | Admitting: Internal Medicine

## 2017-06-09 ENCOUNTER — Ambulatory Visit (INDEPENDENT_AMBULATORY_CARE_PROVIDER_SITE_OTHER): Payer: Medicare Other

## 2017-06-09 ENCOUNTER — Telehealth: Payer: Self-pay | Admitting: Internal Medicine

## 2017-06-09 ENCOUNTER — Other Ambulatory Visit: Payer: Self-pay | Admitting: *Deleted

## 2017-06-09 DIAGNOSIS — I480 Paroxysmal atrial fibrillation: Secondary | ICD-10-CM | POA: Diagnosis not present

## 2017-06-09 DIAGNOSIS — Z5181 Encounter for therapeutic drug level monitoring: Secondary | ICD-10-CM

## 2017-06-09 LAB — POCT INR: INR: 2.1

## 2017-06-09 MED ORDER — AMIODARONE HCL 200 MG PO TABS: 100.0000 mg | ORAL_TABLET | Freq: Every day | ORAL | 0 refills | Status: DC

## 2017-06-09 NOTE — Patient Instructions (Signed)
Please continue dosage of 1mg  daily except 2mg s on Mondays and Saturdays.  Please be consistent with your greens intake.   Recheck in 3 weeks.

## 2017-06-09 NOTE — Telephone Encounter (Signed)
°*  STAT* If patient is at the pharmacy, call can be transferred to refill team.   1. Which medications need to be refilled? (please list name of each medication and dose if known) Amiodarone 200 mg po q day    2. Which pharmacy/location (including street and city if local pharmacy) is medication to be sent to? [facility] [address]   3. Do they need a 30 day or 90 day supply? 90   PATIENT OVERDUE FOR APPT WITH [facility] BUT ONLY HAS 4 PILLS LEFT SCHEDULED NEXT AVAILABLE IN MAY AND ADDED TO WAITLIST

## 2017-06-09 NOTE — Telephone Encounter (Signed)
Requested Prescriptions   Signed Prescriptions Disp Refills  . amiodarone (PACERONE) 200 MG tablet 45 tablet 0    Sig: Take 0.5 tablets (100 mg total) by mouth daily.    Authorizing Provider: Deboraha Sprang    Ordering User: Britt Bottom

## 2017-06-15 ENCOUNTER — Ambulatory Visit (INDEPENDENT_AMBULATORY_CARE_PROVIDER_SITE_OTHER): Payer: Medicare Other

## 2017-06-15 DIAGNOSIS — E538 Deficiency of other specified B group vitamins: Secondary | ICD-10-CM

## 2017-06-15 MED ORDER — CYANOCOBALAMIN 1000 MCG/ML IJ SOLN
1000.0000 ug | Freq: Once | INTRAMUSCULAR | Status: AC
  Administered 2017-06-15: 1000 ug via INTRAMUSCULAR

## 2017-06-27 ENCOUNTER — Other Ambulatory Visit: Payer: Self-pay | Admitting: Urology

## 2017-06-28 ENCOUNTER — Ambulatory Visit (INDEPENDENT_AMBULATORY_CARE_PROVIDER_SITE_OTHER): Payer: Medicare Other

## 2017-06-28 DIAGNOSIS — Z5181 Encounter for therapeutic drug level monitoring: Secondary | ICD-10-CM

## 2017-06-28 DIAGNOSIS — I480 Paroxysmal atrial fibrillation: Secondary | ICD-10-CM

## 2017-06-28 LAB — POCT INR: INR: 2.8

## 2017-06-28 NOTE — Patient Instructions (Signed)
Please continue dosage of 1mg  daily except 2mg s on Mondays and Saturdays.  Please be consistent with your greens intake.   Recheck in 4 weeks.

## 2017-07-13 ENCOUNTER — Ambulatory Visit (INDEPENDENT_AMBULATORY_CARE_PROVIDER_SITE_OTHER): Payer: Medicare Other | Admitting: Internal Medicine

## 2017-07-13 ENCOUNTER — Encounter: Payer: Self-pay | Admitting: Internal Medicine

## 2017-07-13 VITALS — BP 120/70 | HR 70 | Ht 70.0 in | Wt 136.5 lb

## 2017-07-13 DIAGNOSIS — I480 Paroxysmal atrial fibrillation: Secondary | ICD-10-CM

## 2017-07-13 DIAGNOSIS — Z79899 Other long term (current) drug therapy: Secondary | ICD-10-CM

## 2017-07-13 DIAGNOSIS — Z950 Presence of cardiac pacemaker: Secondary | ICD-10-CM

## 2017-07-13 DIAGNOSIS — I495 Sick sinus syndrome: Secondary | ICD-10-CM | POA: Diagnosis not present

## 2017-07-13 NOTE — Progress Notes (Signed)
Electrophysiology Office Note    Date:  07/13/2017   ID:  [name] [name], DOB February 01, 1923, MRN [id number]  PCP:  Owens Loffler, MD  Cardiologist:  Primary Electrophysiologist:  Virl Axe, MD    Chief Complaint  Patient presents with  . other    6 month with pacemaker check. Meds reviewed by the pt. verbally. "doing well."      History of Present Illness: [name] [name] is a 84 y.o. male is seen today in followup for  pacemaker implanted for tachybradycardia syndrome in  the context of paroxysmal atrial fibrillation with prior failed ablation. He takes amiodarone. He has had mostly sinus ( 97%) from interrogation 7/17  He underwent device generator replacement 11/17      Date TSH LFTs Hgb PFTs  7/17  0.71      8/18   12.3   1/19  0.3 15 10.5      He denies chest pain shortness of breath.  He does have peripheral edema and takes his torsemide sort of on his own  Denies cough.  No nausea.  Tolerating amiodarone.  Has some discoloration of stool but is now on iron.   Synthroid last adjusted more than a year ago  Past Medical History:  Diagnosis Date  . Cancer ([place])   . Cardiac pacemaker in situ 07/2005   symptomatic bradycardia  . CKD (chronic kidney disease) stage 3, GFR 30-59 ml/min ([name]) 04/13/2017  . Diverticulosis of colon (without mention of hemorrhage)   . 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 GERD (gastroesophageal reflux disease)   . GI hemorrhage   . Hyperlipidemia   . Hypertension   . Lower extremity edema   . Neck arthritis, Severe, multi-level 06/09/2013  . Paroxysmal atrial fibrillation ([name])    s/p failed ablation  . Peptic ulcer, unspecified site, unspecified as acute or chronic, without mention of hemorrhage, perforation, or obstruction 1975  . Personal history of malignant neoplasm of prostate 12/2000   5 weeks radiation, radiation seeds  . Radiation proctitis   .  Sinoatrial node dysfunction ([name])   . Unspecified glaucoma(365.9)   . Unspecified hypothyroidism   . Vitamin B12 deficiency    Past Surgical History:  Procedure Laterality Date  . CARDIAC CATHETERIZATION    . CARDIAC ELECTROPHYSIOLOGY [place] AND ABLATION  2001, 2003   failure  . CATARACT EXTRACTION    . CYSTOSCOPY WITH URETHRAL DILATATION N/A 01/29/2017   Procedure: CYSTOSCOPY WITH URETHRAL DILATATION;  Surgeon: Abbie Sons, MD;  Location: [facility];  Service: Urology;  Laterality: N/A;  . EP IMPLANTABLE DEVICE N/A 02/24/2016   Procedure: PPM Generator Changeout;  Surgeon: Deboraha Sprang, MD;  Location: [facility];  Service: Cardiovascular;  Laterality: N/A;  . gastric ulcer surgery    . HERNIA REPAIR  1994, 2001, 2003   s/p drainage and complications, 0/1751, 0/2585 mesh removal  . INSERT / REPLACE / REMOVE PACEMAKER  07/2005   symptomatic bradycardia  . kidney stones    . NOSE SURGERY     cancer removed   . PROSTATE SURGERY    . SKIN CANCER DESTRUCTION    . TOOTH EXTRACTION       Current Outpatient Medications  Medication Sig Dispense Refill  . acetaminophen (TYLENOL) 500 MG tablet Take 500 mg by mouth every 6 (six) hours as needed for moderate pain or headache.    . alendronate (  FOSAMAX) 70 MG tablet TAKE 1 TABLET BY MOUTH EVERY 7 DAYS. TAKE WITH A FULL GLASS OF WATER ON AN EMPTY STOMACH 12 tablet 3  . amiodarone (PACERONE) 200 MG tablet Take 0.5 tablets (100 mg total) by mouth daily. 45 tablet 0  . clindamycin (CLEOCIN) 300 MG capsule Take 600 mg by mouth See admin instructions. Take 600 mg by mouth 1 hour prior to dental procedure    . Cyanocobalamin (VITAMIN B-12 IJ) Inject 1,000 mcg as directed every 30 (thirty) days.     Marland Kitchen docusate sodium (COLACE) 50 MG capsule Take 50 mg by mouth 2 (two) times daily as needed for mild constipation.    . dorzolamide-timolol (COSOPT) 22.3-6.8 MG/ML ophthalmic solution Place 1 drop into both eyes 2 (two) times daily.      .  ferrous sulfate 325 (65 FE) MG EC tablet Take 325 mg by mouth daily with breakfast.      . levothyroxine (SYNTHROID, LEVOTHROID) 100 MCG tablet TAKE 1 TABLET BY MOUTH DAILY BEFORE BREAKFAST 30 tablet 3  . loperamide (IMODIUM) 2 MG capsule Take 2 mg by mouth as needed for diarrhea or loose stools.    . Multiple Vitamin (MULTIVITAMIN) tablet Take 1 tablet by mouth daily.      . pantoprazole (PROTONIX) 40 MG tablet Take 1 tablet (40 mg total) by mouth daily. 90 tablet 2  . polyethylene glycol (MIRALAX / GLYCOLAX) packet Take 17 g by mouth daily as needed.    . pravastatin (PRAVACHOL) 40 MG tablet TAKE ONE-HALF TABLET BY MOUTH DAILY 45 tablet 1  . protein supplement (RESOURCE BENEPROTEIN) POWD Take 1 scoop by mouth 3 (three) times daily with meals.    . simethicone (MYLICON) 416 MG chewable tablet Chew 125 mg by mouth every 6 (six) hours as needed for flatulence.    . tamsulosin (FLOMAX) 0.4 MG CAPS capsule TAKE 1 CAPSULE(0.4 MG) BY MOUTH DAILY 30 capsule 0  . torsemide (DEMADEX) 20 MG tablet Take 1 tablet (20 mg total) by mouth daily as needed (for fluid). 90 tablet 1  . warfarin (COUMADIN) 4 MG tablet Take as directed by coumadin clinic (Patient taking differently: Take 1-2 mg by mouth See admin instructions. Take 1 mg by mouth daily on Sundays, Tuesdays, Thursdays, and Friday. Take 2 mg by mouth daily on Monday, Wednesday, and Saturdays.) 90 tablet 1  . Wheat Dextrin (BENEFIBER DRINK MIX PO) Take 1 scoop by mouth daily.      No current facility-administered medications for this visit.     Allergies:   Penicillins   Social History:  The patient  reports that he has never smoked. He has never used smokeless tobacco. He reports that he does not drink alcohol or use drugs.   Family History:  The patient's    family history includes Alcohol abuse in his unknown relative; Bone cancer in his father; Breast cancer in his mother; Coronary artery disease in his unknown relative; Dementia in his unknown  relative; Diabetes in his unknown relative.    ROS:  Please see the history of present illness and past medical histor .   All other systems are reviewed and negative.    PHYSICAL EXAM: VS:  BP 120/70 (BP Location: Left Arm, Patient Position: Sitting, Cuff Size: Normal)   Pulse 70   Ht 5\' 10"  (1.778 m)   Wt 136 lb 8 oz (61.9 kg)   BMI 19.59 kg/m  , BMI Body mass index is 19.59 kg/m. Well developed and nourished in no  acute distress HENT normal Neck supple with JVP-flat Clear Device pocket well healed; without hematoma or erythema.  There is no tethering  Regular rate and rhythm, no murmurs or gallops Abd-soft with active BS No Clubbing cyanosis 1+ edema Skin-warm and dry A & Oriented  Grossly normal sensory and motor function   ECG Personally reviewed a pacing with intrinsic conduction rate of 70 Intervals 32/09/40  Device interrogation is reviewed today in detail.  See PaceArt for details.      Lipid Panel     Component Value Date/Time   CHOL 144 09/11/2016 0812   TRIG 71.0 09/11/2016 0812   HDL 56.40 09/11/2016 0812   CHOLHDL 3 09/11/2016 0812   VLDL 14.2 09/11/2016 0812   LDLCALC 73 09/11/2016 0812     Wt Readings from Last 3 Encounters:  07/13/17 136 lb 8 oz (61.9 kg)  04/16/17 141 lb (64 kg)  04/12/17 140 lb 4 oz (63.6 kg)      Other studies Reviewed: As above    ASSESSMENT AND PLAN:  Atrial fibrillation - paroxysmal  Sinus node dysfunction   Pacemaker  [employer]tronic  The patient's device was interrogated.  The information was reviewed. No changes  Hypothyroidism-treated  Amiodarone therapy  anemia.  Edema-chronic     He is doing very well.  His edema is partly chronic.  He takes his torsemide as needed.  We will continue this.  There is evidence of a progressive anemia; we will recheck his hemoglobin.  He is on iron.  Although a ferritin level 2017 was normal  Last TSH was low.  We will recheck   Tolerating amiodarone with a 5% A. fib  burden.   No obvious bleeding on Coumadin.  We spent more than 50% of our >25 min visit in face to face counseling regarding the above      Signed, Virl Axe, MD  07/13/2017 9:37 AM     [address] [place] [address] [phone] (office) [phone] (fax)

## 2017-07-13 NOTE — Patient Instructions (Signed)
Medication Instructions: - Your physician recommends that you continue on your current medications as directed. Please refer to the Current Medication list given to you today.  Labwork: - Your physician recommends that you have lab work today: CBC/ TSH  Procedures/Testing: - none ordered  Follow-Up: - Remote monitoring is used to monitor your Pacemaker of ICD from home. This monitoring reduces the number of office visits required to check your device to one time per year. It allows [place] to keep an eye on the functioning of your device to ensure it is working properly. You are scheduled for a device check from home on 07/26/17. You may send your transmission at any time that day. If you have a wireless device, the transmission will be sent automatically. After your physician reviews your transmission, you will receive a postcard with your next transmission date.  - Your physician wants you to follow-up in: 6 months with Dr. Caryl Comes. You will receive a reminder letter in the mail two months in advance. If you don't receive a letter, please call our office to schedule the follow-up appointment.   Any Additional Special Instructions Will Be Listed Below (If Applicable).     If you need a refill on your cardiac medications before your next appointment, please call your pharmacy.

## 2017-07-14 LAB — CBC WITH DIFFERENTIAL/PLATELET
Basophils Absolute: 0 10*3/uL (ref 0.0–0.2)
Basos: 1 %
EOS (ABSOLUTE): 0.2 10*3/uL (ref 0.0–0.4)
EOS: 4 %
HEMATOCRIT: 35.2 % — AB (ref 37.5–51.0)
HEMOGLOBIN: 10.9 g/dL — AB (ref 13.0–17.7)
Immature Grans (Abs): 0 10*3/uL (ref 0.0–0.1)
Immature Granulocytes: 0 %
LYMPHS ABS: 1.3 10*3/uL (ref 0.7–3.1)
Lymphs: 24 %
MCH: 30.3 pg (ref 26.6–33.0)
MCHC: 31 g/dL — AB (ref 31.5–35.7)
MCV: 98 fL — ABNORMAL HIGH (ref 79–97)
MONOS ABS: 0.4 10*3/uL (ref 0.1–0.9)
Monocytes: 8 %
NEUTROS ABS: 3.3 10*3/uL (ref 1.4–7.0)
Neutrophils: 63 %
Platelets: 211 10*3/uL (ref 150–379)
RBC: 3.6 x10E6/uL — AB (ref 4.14–5.80)
RDW: 14.7 % (ref 12.3–15.4)
WBC: 5.3 10*3/uL (ref 3.4–10.8)

## 2017-07-14 LAB — TSH: TSH: 0.233 u[IU]/mL — ABNORMAL LOW (ref 0.450–4.500)

## 2017-07-15 ENCOUNTER — Encounter: Payer: Medicare Other | Admitting: Internal Medicine

## 2017-07-20 ENCOUNTER — Ambulatory Visit (INDEPENDENT_AMBULATORY_CARE_PROVIDER_SITE_OTHER): Payer: Medicare Other

## 2017-07-20 DIAGNOSIS — E538 Deficiency of other specified B group vitamins: Secondary | ICD-10-CM

## 2017-07-20 MED ORDER — CYANOCOBALAMIN 1000 MCG/ML IJ SOLN
1000.0000 ug | Freq: Once | INTRAMUSCULAR | Status: AC
  Administered 2017-07-20: 1000 ug via INTRAMUSCULAR

## 2017-07-21 ENCOUNTER — Telehealth: Payer: Self-pay | Admitting: Internal Medicine

## 2017-07-21 DIAGNOSIS — R7989 Other specified abnormal findings of blood chemistry: Secondary | ICD-10-CM

## 2017-07-21 DIAGNOSIS — Z79899 Other long term (current) drug therapy: Secondary | ICD-10-CM

## 2017-07-21 DIAGNOSIS — I480 Paroxysmal atrial fibrillation: Secondary | ICD-10-CM

## 2017-07-21 NOTE — Telephone Encounter (Signed)
The patient is aware of his labresults. He is agreeable to a lab draw to check a free T3 & free T4 due to low TSH per Dr. Caryl Comes. He is due for a coumadin check on 4/29, so we will draw his labs the day he comes in for that. He voices understanding.

## 2017-07-24 ENCOUNTER — Other Ambulatory Visit: Payer: Self-pay | Admitting: Family Medicine

## 2017-07-26 ENCOUNTER — Other Ambulatory Visit (INDEPENDENT_AMBULATORY_CARE_PROVIDER_SITE_OTHER): Payer: Medicare Other

## 2017-07-26 ENCOUNTER — Other Ambulatory Visit: Payer: Self-pay | Admitting: Urology

## 2017-07-26 ENCOUNTER — Ambulatory Visit (INDEPENDENT_AMBULATORY_CARE_PROVIDER_SITE_OTHER): Payer: Medicare Other

## 2017-07-26 ENCOUNTER — Ambulatory Visit (INDEPENDENT_AMBULATORY_CARE_PROVIDER_SITE_OTHER): Payer: Medicare Other | Admitting: *Deleted

## 2017-07-26 DIAGNOSIS — I480 Paroxysmal atrial fibrillation: Secondary | ICD-10-CM

## 2017-07-26 DIAGNOSIS — Z79899 Other long term (current) drug therapy: Secondary | ICD-10-CM | POA: Diagnosis not present

## 2017-07-26 DIAGNOSIS — I495 Sick sinus syndrome: Secondary | ICD-10-CM

## 2017-07-26 DIAGNOSIS — Z5181 Encounter for therapeutic drug level monitoring: Secondary | ICD-10-CM | POA: Diagnosis not present

## 2017-07-26 DIAGNOSIS — R7989 Other specified abnormal findings of blood chemistry: Secondary | ICD-10-CM

## 2017-07-26 LAB — POCT INR: INR: 3.3

## 2017-07-26 NOTE — Patient Instructions (Signed)
Please skip coumadin today, then continue dosage of 1mg  daily except 2mg s on Mondays and Saturdays.  Please be consistent with your greens intake.   Recheck in 3 weeks.

## 2017-07-27 ENCOUNTER — Encounter: Payer: Self-pay | Admitting: Cardiology

## 2017-07-27 LAB — T3, FREE: T3, Free: 1.2 pg/mL — ABNORMAL LOW (ref 2.0–4.4)

## 2017-07-27 LAB — T4, FREE: Free T4: 1.72 ng/dL (ref 0.82–1.77)

## 2017-07-27 NOTE — Progress Notes (Signed)
Remote pacemaker transmission.   

## 2017-07-28 ENCOUNTER — Telehealth: Payer: Self-pay

## 2017-07-28 LAB — CUP PACEART REMOTE DEVICE CHECK
Battery Remaining Longevity: 104 mo
Battery Voltage: 3.02 V
Brady Statistic AP VP Percent: 0.79 %
Brady Statistic AS VS Percent: 2.68 %
Brady Statistic RA Percent Paced: 96.33 %
Brady Statistic RV Percent Paced: 1.36 %
Date Time Interrogation Session: 20190429165728
Implantable Lead Implant Date: 20070525
Implantable Lead Implant Date: 20070525
Implantable Lead Location: 753860
Implantable Pulse Generator Implant Date: 20171127
Lead Channel Impedance Value: 342 Ohm
Lead Channel Impedance Value: 361 Ohm
Lead Channel Impedance Value: 399 Ohm
Lead Channel Pacing Threshold Amplitude: 0.625 V
Lead Channel Pacing Threshold Amplitude: 0.875 V
Lead Channel Pacing Threshold Pulse Width: 0.4 ms
Lead Channel Pacing Threshold Pulse Width: 0.4 ms
Lead Channel Sensing Intrinsic Amplitude: 1.625 mV
Lead Channel Setting Pacing Amplitude: 2 V
Lead Channel Setting Sensing Sensitivity: 0.6 mV
MDC IDC LEAD LOCATION: 753859
MDC IDC MSMT LEADCHNL RA IMPEDANCE VALUE: 342 Ohm
MDC IDC MSMT LEADCHNL RA SENSING INTR AMPL: 1.625 mV
MDC IDC MSMT LEADCHNL RV SENSING INTR AMPL: 1.375 mV
MDC IDC MSMT LEADCHNL RV SENSING INTR AMPL: 1.375 mV
MDC IDC SET LEADCHNL RV PACING AMPLITUDE: 2.5 V
MDC IDC SET LEADCHNL RV PACING PULSEWIDTH: 0.4 ms
MDC IDC STAT BRADY AP VS PERCENT: 96 %
MDC IDC STAT BRADY AS VP PERCENT: 0.52 %

## 2017-07-28 LAB — CUP PACEART INCLINIC DEVICE CHECK
Date Time Interrogation Session: 20190501083003
Implantable Lead Implant Date: 20070525
Implantable Lead Location: 753860
Implantable Lead Model: 5076
Implantable Lead Model: 5076
Implantable Pulse Generator Implant Date: 20171127
MDC IDC LEAD IMPLANT DT: 20070525
MDC IDC LEAD LOCATION: 753859

## 2017-07-28 NOTE — Telephone Encounter (Signed)
-----   Message from Deboraha Sprang, MD sent at 07/28/2017 11:05 AM EDT ----- Please Inform Patient that labs do not support HYPER Thyroidsim suggested by the low TSH Thanks

## 2017-07-28 NOTE — Telephone Encounter (Signed)
Pt is aware and agreeable to results. He was grateful for my call

## 2017-07-31 ENCOUNTER — Other Ambulatory Visit: Payer: Self-pay | Admitting: Internal Medicine

## 2017-08-10 ENCOUNTER — Encounter

## 2017-08-10 ENCOUNTER — Encounter: Payer: Medicare Other | Admitting: Internal Medicine

## 2017-08-16 ENCOUNTER — Ambulatory Visit (INDEPENDENT_AMBULATORY_CARE_PROVIDER_SITE_OTHER): Payer: Medicare Other

## 2017-08-16 DIAGNOSIS — I480 Paroxysmal atrial fibrillation: Secondary | ICD-10-CM | POA: Diagnosis not present

## 2017-08-16 LAB — POCT INR: INR: 2.6

## 2017-08-16 NOTE — Patient Instructions (Signed)
Please continue dosage of 1mg  daily except 2mg s on Mondays and Saturdays.  Please be consistent with your greens intake.   Recheck in 4 weeks.

## 2017-08-19 ENCOUNTER — Ambulatory Visit (INDEPENDENT_AMBULATORY_CARE_PROVIDER_SITE_OTHER): Payer: Medicare Other | Admitting: *Deleted

## 2017-08-19 DIAGNOSIS — E538 Deficiency of other specified B group vitamins: Secondary | ICD-10-CM | POA: Diagnosis not present

## 2017-08-19 MED ORDER — CYANOCOBALAMIN 1000 MCG/ML IJ SOLN
1000.0000 ug | Freq: Once | INTRAMUSCULAR | Status: AC
  Administered 2017-08-19: 1000 ug via INTRAMUSCULAR

## 2017-08-22 ENCOUNTER — Other Ambulatory Visit: Payer: Self-pay | Admitting: Urology

## 2017-09-06 ENCOUNTER — Other Ambulatory Visit: Payer: Self-pay | Admitting: Internal Medicine

## 2017-09-07 ENCOUNTER — Other Ambulatory Visit: Payer: Self-pay

## 2017-09-07 MED ORDER — WARFARIN SODIUM 4 MG PO TABS: ORAL_TABLET | ORAL | 1 refills | Status: DC

## 2017-09-13 ENCOUNTER — Ambulatory Visit (INDEPENDENT_AMBULATORY_CARE_PROVIDER_SITE_OTHER): Payer: Medicare Other

## 2017-09-13 DIAGNOSIS — I480 Paroxysmal atrial fibrillation: Secondary | ICD-10-CM | POA: Diagnosis not present

## 2017-09-13 DIAGNOSIS — Z5181 Encounter for therapeutic drug level monitoring: Secondary | ICD-10-CM | POA: Diagnosis not present

## 2017-09-13 LAB — POCT INR: INR: 2 (ref 2.0–3.0)

## 2017-09-13 NOTE — Patient Instructions (Signed)
Please continue dosage of 1mg  daily except 2mg s on Mondays and Saturdays.  Please be consistent with your greens intake.   Recheck in 4 weeks.

## 2017-09-22 ENCOUNTER — Ambulatory Visit (INDEPENDENT_AMBULATORY_CARE_PROVIDER_SITE_OTHER): Payer: Medicare Other | Admitting: *Deleted

## 2017-09-22 ENCOUNTER — Other Ambulatory Visit: Payer: Self-pay | Admitting: Urology

## 2017-09-22 DIAGNOSIS — E538 Deficiency of other specified B group vitamins: Secondary | ICD-10-CM

## 2017-09-22 MED ORDER — CYANOCOBALAMIN 1000 MCG/ML IJ SOLN
1000.0000 ug | Freq: Once | INTRAMUSCULAR | Status: AC
  Administered 2017-09-22: 1000 ug via INTRAMUSCULAR

## 2017-09-22 NOTE — Progress Notes (Signed)
Per orders of Dr. Lorelei Pont, injection of B12 given by Tammi Sou. Patient tolerated injection well.

## 2017-10-08 ENCOUNTER — Other Ambulatory Visit (INDEPENDENT_AMBULATORY_CARE_PROVIDER_SITE_OTHER): Payer: Medicare Other

## 2017-10-08 ENCOUNTER — Telehealth (INDEPENDENT_AMBULATORY_CARE_PROVIDER_SITE_OTHER): Payer: Medicare Other | Admitting: Family Medicine

## 2017-10-08 DIAGNOSIS — E038 Other specified hypothyroidism: Secondary | ICD-10-CM

## 2017-10-08 DIAGNOSIS — N183 Chronic kidney disease, stage 3 unspecified: Secondary | ICD-10-CM

## 2017-10-08 DIAGNOSIS — E538 Deficiency of other specified B group vitamins: Secondary | ICD-10-CM

## 2017-10-08 DIAGNOSIS — E7849 Other hyperlipidemia: Secondary | ICD-10-CM

## 2017-10-08 DIAGNOSIS — D509 Iron deficiency anemia, unspecified: Secondary | ICD-10-CM | POA: Diagnosis not present

## 2017-10-08 LAB — CBC WITH DIFFERENTIAL/PLATELET
BASOS ABS: 0 10*3/uL (ref 0.0–0.1)
Basophils Relative: 0.9 % (ref 0.0–3.0)
EOS ABS: 0.2 10*3/uL (ref 0.0–0.7)
Eosinophils Relative: 4.6 % (ref 0.0–5.0)
HCT: 31.4 % — ABNORMAL LOW (ref 39.0–52.0)
HEMOGLOBIN: 10.4 g/dL — AB (ref 13.0–17.0)
Lymphocytes Relative: 27.8 % (ref 12.0–46.0)
Lymphs Abs: 1.4 10*3/uL (ref 0.7–4.0)
MCHC: 33 g/dL (ref 30.0–36.0)
MCV: 99.4 fl (ref 78.0–100.0)
MONO ABS: 0.5 10*3/uL (ref 0.1–1.0)
Monocytes Relative: 10.3 % (ref 3.0–12.0)
Neutro Abs: 2.7 10*3/uL (ref 1.4–7.7)
Neutrophils Relative %: 56.4 % (ref 43.0–77.0)
Platelets: 161 10*3/uL (ref 150.0–400.0)
RBC: 3.16 Mil/uL — AB (ref 4.22–5.81)
RDW: 14.1 % (ref 11.5–15.5)
WBC: 4.9 10*3/uL (ref 4.0–10.5)

## 2017-10-08 LAB — VITAMIN D 25 HYDROXY (VIT D DEFICIENCY, FRACTURES): VITD: 18.67 ng/mL — AB (ref 30.00–100.00)

## 2017-10-08 LAB — LIPID PANEL
CHOLESTEROL: 123 mg/dL (ref 0–200)
HDL: 60.2 mg/dL (ref >39.00)
LDL Cholesterol: 53 mg/dL (ref 0–99)
NONHDL: 62.85
Total CHOL/HDL Ratio: 2
Triglycerides: 49 mg/dL (ref 0.0–149.0)
VLDL: 9.8 mg/dL (ref 0.0–40.0)

## 2017-10-08 LAB — COMPREHENSIVE METABOLIC PANEL
ALK PHOS: 77 U/L (ref 39–117)
ALT: 18 U/L (ref 0–53)
AST: 24 U/L (ref 0–37)
Albumin: 3.3 g/dL — ABNORMAL LOW (ref 3.5–5.2)
BUN: 38 mg/dL — AB (ref 6–23)
CO2: 25 mEq/L (ref 19–32)
Calcium: 8.5 mg/dL (ref 8.4–10.5)
Chloride: 113 mEq/L — ABNORMAL HIGH (ref 96–112)
Creatinine, Ser: 2.2 mg/dL — ABNORMAL HIGH (ref 0.40–1.50)
GFR: 29.87 mL/min — ABNORMAL LOW (ref >60.00)
GLUCOSE: 93 mg/dL (ref 70–99)
Potassium: 4.4 mEq/L (ref 3.5–5.1)
Sodium: 142 mEq/L (ref 135–145)
TOTAL PROTEIN: 5.8 g/dL — AB (ref 6.0–8.3)
Total Bilirubin: 0.7 mg/dL (ref 0.2–1.2)

## 2017-10-08 LAB — T3, FREE: T3, Free: 1.7 pg/mL — ABNORMAL LOW (ref 2.3–4.2)

## 2017-10-08 LAB — VITAMIN B12: VITAMIN B 12: 585 pg/mL (ref 211–911)

## 2017-10-08 LAB — TSH: TSH: 0.21 u[IU]/mL — ABNORMAL LOW (ref 0.35–4.50)

## 2017-10-08 LAB — T4, FREE: Free T4: 1.47 ng/dL (ref 0.60–1.60)

## 2017-10-08 NOTE — Telephone Encounter (Signed)
entered labs

## 2017-10-08 NOTE — Addendum Note (Signed)
Addended by: Ellamae Sia on: 10/08/2017 03:21 PM   Modules accepted: Orders

## 2017-10-09 ENCOUNTER — Other Ambulatory Visit: Payer: Self-pay | Admitting: Urology

## 2017-10-09 ENCOUNTER — Other Ambulatory Visit: Payer: Self-pay | Admitting: Family Medicine

## 2017-10-11 ENCOUNTER — Ambulatory Visit (INDEPENDENT_AMBULATORY_CARE_PROVIDER_SITE_OTHER): Payer: Medicare Other

## 2017-10-11 DIAGNOSIS — Z5181 Encounter for therapeutic drug level monitoring: Secondary | ICD-10-CM | POA: Diagnosis not present

## 2017-10-11 DIAGNOSIS — I480 Paroxysmal atrial fibrillation: Secondary | ICD-10-CM | POA: Diagnosis not present

## 2017-10-11 LAB — POCT INR: INR: 3.1 — AB (ref 2.0–3.0)

## 2017-10-11 NOTE — Patient Instructions (Signed)
Please take 1 mg today, have a large serving of greens and continue dosage of 1mg  daily except 2mg s on Mondays and Saturdays.  Please be consistent with your greens intake.   Recheck in 4 weeks.

## 2017-10-14 ENCOUNTER — Ambulatory Visit (INDEPENDENT_AMBULATORY_CARE_PROVIDER_SITE_OTHER): Payer: Medicare Other | Admitting: Family Medicine

## 2017-10-14 ENCOUNTER — Encounter: Payer: Self-pay | Admitting: Family Medicine

## 2017-10-14 VITALS — BP 104/64 | HR 76 | Temp 97.8°F | Ht 68.5 in | Wt 129.2 lb

## 2017-10-14 DIAGNOSIS — E538 Deficiency of other specified B group vitamins: Secondary | ICD-10-CM

## 2017-10-14 DIAGNOSIS — I480 Paroxysmal atrial fibrillation: Secondary | ICD-10-CM | POA: Diagnosis not present

## 2017-10-14 DIAGNOSIS — I10 Essential (primary) hypertension: Secondary | ICD-10-CM | POA: Diagnosis not present

## 2017-10-14 DIAGNOSIS — E7800 Pure hypercholesterolemia, unspecified: Secondary | ICD-10-CM

## 2017-10-14 DIAGNOSIS — N179 Acute kidney failure, unspecified: Secondary | ICD-10-CM | POA: Diagnosis not present

## 2017-10-14 DIAGNOSIS — C61 Malignant neoplasm of prostate: Secondary | ICD-10-CM

## 2017-10-14 NOTE — Patient Instructions (Signed)
Hold your Demadex / Torsemide - for 2 days and be sure to drink plenty of fluids.   Come back next Monday - Wed - your choice to redraw your kidney function.

## 2017-10-14 NOTE — Progress Notes (Signed)
Dr. Frederico Hamman T. Chase Knebel, MD, [facility] [place], [address] Phone: [phone] Fax: [phone]  10/14/2017  Patient: [name] [name], MRN: [id number], DOB: 10-06-1922, 84 y.o.  Primary Physician:  Owens Loffler, MD   Chief Complaint  Patient presents with  . Follow-up    6 months   Subjective:   [name] [name] is a 84 y.o. very pleasant male patient who presents with the following:  AKI: cr 2.2  Pleasant gentleman who I know well who has a history of A. fib, he has a pacemaker, and additionally has high blood pressure hypothyroidism, hyperlipidemia.  Today he presents with an acute kidney injury with a creatinine of 2.2 and he also at baseline has chronic kidney disease stage III, this is slightly worsened today.  He is overall done quite well, he has lost some weight, but he is getting along quite well and still eating about twice a day and interacting on a daily basis with his lady friend.  He still is on Coumadin daily, and he also takes some torsemide 20 mg a day  Past Medical History, Surgical History, Social History, Family History, Problem List, Medications, and Allergies have been reviewed and updated if relevant.  Patient Active Problem List   Diagnosis Date Noted  . PACEMAKER, PERMANENT 08/07/2008    Priority: Medium  . PAROXYSMAL ATRIAL FIBRILLATION 02/06/2008    Priority: Medium  . CKD (chronic kidney disease) stage 3, GFR 30-59 ml/min ([name]) 04/13/2017  . DDD (degenerative disc disease), cervical, Severe 06/09/2013  . Neck arthritis, Severe, multi-level 06/09/2013  . Sinoatrial node dysfunction ([name])   . Long term (current) use of anticoagulants 06/24/2010  . IRON DEFICIENCY 04/17/2010  . GLAUCOMA 08/07/2008  . GERD 08/07/2008  . Vitamin B 12 deficiency 07/19/2008  . PEPTIC ULCER DISEASE 07/18/2008  . RADIATION PROCTITIS 07/18/2008  . DIVERTICULOSIS, COLON 07/18/2008  . Prostate cancer,  in remission 07/18/2008  . Essential hypertension 05/09/2008  . Hyperlipidemia 02/06/2008  . Hypothyroidism 12/01/2007  . HERN UNS SITE ABD CAV W/O MENTION OBST/GANGREN 12/01/2007    Past Medical History:  Diagnosis Date  . Cancer ([place])   . Cardiac pacemaker in situ 07/2005   symptomatic bradycardia  . CKD (chronic kidney disease) stage 3, GFR 30-59 ml/min ([name]) 04/13/2017  . Diverticulosis of colon (without mention of hemorrhage)   . 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 GERD (gastroesophageal reflux disease)   . GI hemorrhage   . Hyperlipidemia   . Hypertension   . Lower extremity edema   . Neck arthritis, Severe, multi-level 06/09/2013  . Paroxysmal atrial fibrillation ([name])    s/p failed ablation  . Peptic ulcer, unspecified site, unspecified as acute or chronic, without mention of hemorrhage, perforation, or obstruction 1975  . Personal history of malignant neoplasm of prostate 12/2000   5 weeks radiation, radiation seeds  . Radiation proctitis   . Sinoatrial node dysfunction ([name])   . Unspecified glaucoma(365.9)   . Unspecified hypothyroidism   . Vitamin B12 deficiency     Past Surgical History:  Procedure Laterality Date  . CARDIAC CATHETERIZATION    . CARDIAC ELECTROPHYSIOLOGY [place] AND ABLATION  2001, 2003   failure  . CATARACT EXTRACTION    . CYSTOSCOPY WITH URETHRAL DILATATION N/A 01/29/2017   Procedure: CYSTOSCOPY WITH URETHRAL DILATATION;  Surgeon: Abbie Sons, MD;  Location: [facility];  Service: Urology;  Laterality: N/A;  . EP IMPLANTABLE DEVICE N/A 02/24/2016   Procedure: PPM Generator Changeout;  Surgeon: Deboraha Sprang, MD;  Location: [facility];  Service: Cardiovascular;  Laterality: N/A;  . gastric ulcer surgery    . HERNIA REPAIR  1994, 2001, 2003   s/p drainage and complications, 0/6301, 08/107 mesh removal  . INSERT / REPLACE / REMOVE PACEMAKER  07/2005   symptomatic  bradycardia  . kidney stones    . NOSE SURGERY     cancer removed   . PROSTATE SURGERY    . SKIN CANCER DESTRUCTION    . TOOTH EXTRACTION      Social History   Socioeconomic History  . Marital status: Widowed    Spouse name: Not on file  . Number of children: Not on file  . Years of education: Not on file  . Highest education level: Not on file  Occupational History  . Not on file  Social Needs  . Financial resource strain: Not on file  . Food insecurity:    Worry: Not on file    Inability: Not on file  . Transportation needs:    Medical: Not on file    Non-medical: Not on file  Tobacco Use  . Smoking status: Never Smoker  . Smokeless tobacco: Never Used  Substance and Sexual Activity  . Alcohol use: No  . Drug use: No  . Sexual activity: Not on file  Lifestyle  . Physical activity:    Days per week: Not on file    Minutes per session: Not on file  . Stress: Not on file  Relationships  . Social connections:    Talks on phone: Not on file    Gets together: Not on file    Attends religious service: Not on file    Active member of club or organization: Not on file    Attends meetings of clubs or organizations: Not on file    Relationship status: Not on file  . Intimate partner violence:    Fear of current or ex partner: Not on file    Emotionally abused: Not on file    Physically abused: Not on file    Forced sexual activity: Not on file  Other Topics Concern  . Not on file  Social History Narrative  . Not on file    Family History  Problem Relation Age of Onset  . Breast cancer Mother   . Bone cancer Father   . Alcohol abuse Unknown   . Coronary artery disease Unknown   . Dementia Unknown   . Diabetes Unknown     Allergies  Allergen Reactions  . Penicillins Rash and Other (See Comments)    Has patient had a PCN reaction causing immediate rash, facial/tongue/throat swelling, SOB or lightheadedness with hypotension: {no Has patient had a PCN  reaction causing severe rash involving mucus membranes or skin necrosis: {no Has patient had a PCN reaction that required hospitalization {no Has patient had a PCN reaction occurring within the last 10 years: {no If all of the above answers are "NO", then may proceed with Cephalosporin use.    Medication list reviewed and updated in full in [facility].   GEN: No acute illnesses, no fevers, chills. GI: No n/v/d, eating normally Pulm: No SOB Interactive and getting along well at home.  Otherwise, ROS is as per the HPI.  Objective:   BP 104/64   Pulse 76   Temp 97.8 F (  36.6 C) (Oral)   Ht 5' 8.5" (1.74 m)   Wt 129 lb 4 oz (58.6 kg)   BMI 19.37 kg/m   GEN: WDWN, NAD, Non-toxic, A & O x 3 HEENT: Atraumatic, Normocephalic. Neck supple. No masses, No LAD. Ears and Nose: No external deformity. CV: RRR, No M/G/R. No JVD. No thrill. No extra heart sounds. PULM: CTA B, no wheezes, crackles, rhonchi. No retractions. No resp. distress. No accessory muscle use. EXTR: No c/c/e NEURO Normal gait.  PSYCH: Normally interactive. Conversant. Not depressed or anxious appearing.  Calm demeanor.   Laboratory and Imaging Data: Recent Results (from the past 2160 hour(s))  T3, free     Status: Abnormal   Collection Time: 07/26/17  9:51 AM  Result Value Ref Range   T3, Free 1.2 (L) 2.0 - 4.4 pg/mL  T4, free     Status: None   Collection Time: 07/26/17  9:51 AM  Result Value Ref Range   Free T4 1.72 0.82 - 1.77 ng/dL  POCT INR     Status: None   Collection Time: 07/26/17  9:56 AM  Result Value Ref Range   INR 3.3   CUP PACEART REMOTE DEVICE CHECK     Status: None   Collection Time: 07/26/17  4:57 PM  Result Value Ref Range   Date Time Interrogation Session [id number]    Pulse Generator Manufacturer MERM    Pulse Gen Model [id number] Advisa DR MRI    Pulse Gen Serial Number [id number]    Clinic Name [facility]    Implantable Pulse Generator Type Implantable Pulse Generator     Implantable Pulse Generator Implant Date [id number]    Implantable Lead Manufacturer MERM    Implantable Lead Model 5076 CapSureFix Novus    Implantable Lead Serial Number [id number]    Implantable Lead Implant Date [id number]    Implantable Lead Location Detail 1 APPENDAGE    Implantable Lead Location [id number]    Implantable Lead Manufacturer MERM    Implantable Lead Model 5076 CapSureFix Novus    Implantable Lead Serial Number [id number]    Implantable Lead Implant Date [id number]    Implantable Lead Location Detail 1 APEX    Implantable Lead Location [id number]    Lead Channel Setting Sensing Sensitivity 0.6 mV   Lead Channel Setting Pacing Amplitude 2 V   Lead Channel Setting Pacing Pulse Width 0.4 ms   Lead Channel Setting Pacing Amplitude 2.5 V   Lead Channel Impedance Value 361 ohm   Lead Channel Impedance Value 342 ohm   Lead Channel Sensing Intrinsic Amplitude 1.625 mV   Lead Channel Sensing Intrinsic Amplitude 1.625 mV   Lead Channel Pacing Threshold Amplitude 0.625 V   Lead Channel Pacing Threshold Pulse Width 0.4 ms   Lead Channel Impedance Value 399 ohm   Lead Channel Impedance Value 342 ohm   Lead Channel Sensing Intrinsic Amplitude 1.375 mV   Lead Channel Sensing Intrinsic Amplitude 1.375 mV   Lead Channel Pacing Threshold Amplitude 0.875 V   Lead Channel Pacing Threshold Pulse Width 0.4 ms   Battery Status OK    Battery Remaining Longevity 104 mo   Battery Voltage 3.02 V   Brady Statistic RA Percent Paced 96.33 %   Brady Statistic RV Percent Paced 1.36 %   Brady Statistic AP VP Percent 0.79 %   Brady Statistic AS VP Percent 0.52 %   Brady Statistic AP VS Percent 96.00 %   Brady Statistic AS VS Percent 2.68 %   Eval  Rhythm ApVs   POCT INR     Status: None   Collection Time: 08/16/17  9:55 AM  Result Value Ref Range   INR 2.6   POCT INR     Status: None   Collection Time: 09/13/17 10:10 AM  Result Value Ref Range   INR 2.0 2.0 - 3.0  CBC with Differential/Platelet      Status: Abnormal   Collection Time: 10/08/17  2:38 PM  Result Value Ref Range   WBC 4.9 4.0 - 10.5 K/uL   RBC 3.16 (L) 4.22 - 5.81 Mil/uL   Hemoglobin 10.4 (L) 13.0 - 17.0 g/dL   HCT 31.4 (L) 39.0 - 52.0 %   MCV 99.4 78.0 - 100.0 fl   MCHC 33.0 30.0 - 36.0 g/dL   RDW 14.1 11.5 - 15.5 %   Platelets 161.0 150.0 - 400.0 K/uL   Neutrophils Relative % 56.4 43.0 - 77.0 %   Lymphocytes Relative 27.8 12.0 - 46.0 %   Monocytes Relative 10.3 3.0 - 12.0 %   Eosinophils Relative 4.6 0.0 - 5.0 %   Basophils Relative 0.9 0.0 - 3.0 %   Neutro Abs 2.7 1.4 - 7.7 K/uL   Lymphs Abs 1.4 0.7 - 4.0 K/uL   Monocytes Absolute 0.5 0.1 - 1.0 K/uL   Eosinophils Absolute 0.2 0.0 - 0.7 K/uL   Basophils Absolute 0.0 0.0 - 0.1 K/uL  Vitamin B12     Status: None   Collection Time: 10/08/17  2:38 PM  Result Value Ref Range   Vitamin B-12 585 211 - 911 pg/mL  Comprehensive metabolic panel     Status: Abnormal   Collection Time: 10/08/17  2:38 PM  Result Value Ref Range   Sodium 142 135 - 145 mEq/L   Potassium 4.4 3.5 - 5.1 mEq/L   Chloride 113 (H) 96 - 112 mEq/L   CO2 25 19 - 32 mEq/L   Glucose, Bld 93 70 - 99 mg/dL   BUN 38 (H) 6 - 23 mg/dL   Creatinine, Ser 2.20 (H) 0.40 - 1.50 mg/dL   Total Bilirubin 0.7 0.2 - 1.2 mg/dL   Alkaline Phosphatase 77 39 - 117 U/L   AST 24 0 - 37 U/L   ALT 18 0 - 53 U/L   Total Protein 5.8 (L) 6.0 - 8.3 g/dL   Albumin 3.3 (L) 3.5 - 5.2 g/dL   Calcium 8.5 8.4 - 10.5 mg/dL   GFR 29.87 (L) >60.00 mL/min  T3, free     Status: Abnormal   Collection Time: 10/08/17  2:38 PM  Result Value Ref Range   T3, Free 1.7 (L) 2.3 - 4.2 pg/mL  T4, free     Status: None   Collection Time: 10/08/17  2:38 PM  Result Value Ref Range   Free T4 1.47 0.60 - 1.60 ng/dL    Comment: Specimens from patients who are undergoing biotin therapy and /or ingesting biotin supplements may contain high levels of biotin.  The higher biotin concentration in these specimens interferes with this Free T4  assay.  Specimens that contain high levels  of biotin may cause false high results for this Free T4 assay.  Please interpret results in light of the total clinical presentation of the patient.    TSH     Status: Abnormal   Collection Time: 10/08/17  2:38 PM  Result Value Ref Range   TSH 0.21 (L) 0.35 - 4.50 uIU/mL  Lipid panel     Status: None  Collection Time: 10/08/17  2:38 PM  Result Value Ref Range   Cholesterol 123 0 - 200 mg/dL    Comment: ATP III Classification       Desirable:  < 200 mg/dL               Borderline High:  200 - 239 mg/dL          High:  > = 240 mg/dL   Triglycerides 49.0 0.0 - 149.0 mg/dL    Comment: Normal:  <150 mg/dLBorderline High:  150 - 199 mg/dL   HDL 60.20 >39.00 mg/dL   VLDL 9.8 0.0 - 40.0 mg/dL   LDL Cholesterol 53 0 - 99 mg/dL   Total CHOL/HDL Ratio 2     Comment:                Men          Women1/2 Average Risk     3.4          3.3Average Risk          5.0          4.42X Average Risk          9.6          7.13X Average Risk          15.0          11.0                       NonHDL 62.85     Comment: NOTE:  Non-HDL goal should be 30 mg/dL higher than patient's LDL goal (i.e. LDL goal of < 70 mg/dL, would have non-HDL goal of < 100 mg/dL)  VITAMIN D 25 Hydroxy (Vit-D Deficiency, Fractures)     Status: Abnormal   Collection Time: 10/08/17  2:38 PM  Result Value Ref Range   VITD 18.67 (L) 30.00 - 100.00 ng/mL  POCT INR     Status: Abnormal   Collection Time: 10/11/17 10:07 AM  Result Value Ref Range   INR 3.1 (A) 2.0 - 3.0     Assessment and Plan:   AKI (acute kidney injury) ([facility]) - Plan: Basic metabolic panel  Paroxysmal atrial fibrillation ([name])  Essential hypertension  Prostate cancer, in remission  Pure hypercholesterolemia  Vitamin B 12 deficiency   Multiple lab abnormalities.  Most concerning is his kidney function, and to hold his Demadex for 2 days, have him push fluids and recheck this on Monday.  If this persists, then I might  have to alter his Demadex dosing and likely get nephrology involved.  Mild abnormalities in his thyroid panel, but his free T4 is normal, so I am good to keep things where he is currently with his dosing. Globally he is feeling remarkably well for a 84 year old gentleman.  Follow-up: Return in about 6 months (around 04/16/2018) for Medicare wellness.  Orders Placed This Encounter  Procedures  . Basic metabolic panel    Signed,  Frederico Hamman T. Julia Alkhatib, MD   Allergies as of 10/14/2017      Reactions   Penicillins Rash, Other (See Comments)   Has patient had a PCN reaction causing immediate rash, facial/tongue/throat swelling, SOB or lightheadedness with hypotension: {no Has patient had a PCN reaction causing severe rash involving mucus membranes or skin necrosis: {no Has patient had a PCN reaction that required hospitalization {no Has patient had a PCN reaction occurring within the last 10 years: {no If all of the above answers  are "NO", then may proceed with Cephalosporin use.      Medication List        Accurate as of 10/14/17  6:45 PM. Always use your most recent med list.          acetaminophen 500 MG tablet Commonly known as:  TYLENOL Take 500 mg by mouth every 6 (six) hours as needed for moderate pain or headache.   alendronate 70 MG tablet Commonly known as:  FOSAMAX TAKE 1 TABLET BY MOUTH EVERY 7 DAYS. TAKE WITH A FULL GLASS OF WATER ON AN EMPTY STOMACH   amiodarone 200 MG tablet Commonly known as:  PACERONE TAKE 1/2 TABLET BY MOUTH DAILY   BENEFIBER DRINK MIX PO Take 1 scoop by mouth daily.   clindamycin 300 MG capsule Commonly known as:  CLEOCIN Take 600 mg by mouth See admin instructions. Take 600 mg by mouth 1 hour prior to dental procedure   docusate sodium 50 MG capsule Commonly known as:  COLACE Take 50 mg by mouth 2 (two) times daily as needed for mild constipation.   dorzolamide-timolol 22.3-6.8 MG/ML ophthalmic solution Commonly known as:   COSOPT Place 1 drop into both eyes 2 (two) times daily.   ferrous sulfate 325 (65 FE) MG EC tablet Take 325 mg by mouth daily with breakfast.   levothyroxine 100 MCG tablet Commonly known as:  SYNTHROID, LEVOTHROID TAKE 1 TABLET BY MOUTH DAILY BEFORE BREAKFAST   loperamide 2 MG capsule Commonly known as:  IMODIUM Take 2 mg by mouth as needed for diarrhea or loose stools.   multivitamin tablet Take 1 tablet by mouth daily.   pantoprazole 40 MG tablet Commonly known as:  PROTONIX TAKE 1 TABLET(40 MG) BY MOUTH DAILY   polyethylene glycol packet Commonly known as:  MIRALAX / GLYCOLAX Take 17 g by mouth daily as needed.   pravastatin 40 MG tablet Commonly known as:  PRAVACHOL TAKE ONE-HALF TABLET BY MOUTH DAILY   protein supplement Powd Take 1 scoop by mouth 3 (three) times daily with meals.   simethicone 125 MG chewable tablet Commonly known as:  MYLICON Chew 237 mg by mouth every 6 (six) hours as needed for flatulence.   tamsulosin 0.4 MG Caps capsule Commonly known as:  FLOMAX TAKE 1 CAPSULE(0.4 MG) BY MOUTH DAILY   torsemide 20 MG tablet Commonly known as:  DEMADEX Take 1 tablet (20 mg total) by mouth daily as needed (for fluid).   VITAMIN B-12 IJ Inject 1,000 mcg as directed every 30 (thirty) days.   warfarin 4 MG tablet Commonly known as:  COUMADIN Take as directed by the anticoagulation clinic. If you are unsure how to take this medication, talk to your nurse or doctor. Original instructions:  Take as directed by coumadin clinic

## 2017-10-15 ENCOUNTER — Encounter: Payer: Self-pay | Admitting: Urology

## 2017-10-15 ENCOUNTER — Ambulatory Visit (INDEPENDENT_AMBULATORY_CARE_PROVIDER_SITE_OTHER): Payer: Medicare Other | Admitting: Urology

## 2017-10-15 ENCOUNTER — Other Ambulatory Visit: Payer: Self-pay

## 2017-10-15 VITALS — BP 128/79 | HR 91 | Ht 68.5 in | Wt 131.0 lb

## 2017-10-15 DIAGNOSIS — Z8546 Personal history of malignant neoplasm of prostate: Secondary | ICD-10-CM

## 2017-10-15 DIAGNOSIS — R339 Retention of urine, unspecified: Secondary | ICD-10-CM | POA: Diagnosis not present

## 2017-10-15 DIAGNOSIS — T191XXD Foreign body in bladder, subsequent encounter: Secondary | ICD-10-CM

## 2017-10-15 DIAGNOSIS — R300 Dysuria: Secondary | ICD-10-CM | POA: Diagnosis not present

## 2017-10-15 LAB — URINALYSIS, COMPLETE
Bilirubin, UA: NEGATIVE
GLUCOSE, UA: NEGATIVE
KETONES UA: NEGATIVE
NITRITE UA: NEGATIVE
Specific Gravity, UA: 1.015 (ref 1.005–1.030)
UUROB: 0.2 mg/dL (ref 0.2–1.0)
pH, UA: 5.5 (ref 5.0–7.5)

## 2017-10-15 LAB — MICROSCOPIC EXAMINATION: WBC, UA: >30 /hpf — ABNORMAL HIGH (ref 0–5)

## 2017-10-15 NOTE — Progress Notes (Signed)
10/15/2017 2:33 PM   [name] [name] 22-Jul-1922 [id number]  Referring provider: Owens Loffler, MD [place], [place] [address]  Chief Complaint  Patient presents with  . Follow-up    Urologic problem list:  1.  Bladder foreign body- evaluated for microhematuria in 2016 and found to have a bladder wall calcification.  He underwent cystolitholapaxy with intraoperative findings of encrusted hernia mesh within the bladder.  Due to an extreme anterior location the mesh could not be completely removed endoscopically.  He was asymptomatic and declined further treatment.  2. Clinical stage T1c adenocarcinoma of the prostate in 2002  treated with radiation therapy.  He elected to discontinue PSA testing  3.  Dilation of a proximal bulbar urethral stricture 01/2017.   HPI: 84 year old male presents for follow-up of the above problem list.  He was last seen in January 2019 and states he is doing well.  He has no bothersome lower urinary tract symptoms.  Denies dysuria or gross hematuria.  Denies flank, abdominal, pelvic or scrotal pain.   PMH: Past Medical History:  Diagnosis Date  . Cancer ([place])   . Cardiac pacemaker in situ 07/2005   symptomatic bradycardia  . CKD (chronic kidney disease) stage 3, GFR 30-59 ml/min ([name]) 04/13/2017  . Diverticulosis of colon (without mention of hemorrhage)   . 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 GERD (gastroesophageal reflux disease)   . GI hemorrhage   . Hyperlipidemia   . Hypertension   . Lower extremity edema   . Neck arthritis, Severe, multi-level 06/09/2013  . Paroxysmal atrial fibrillation ([name])    s/p failed ablation  . Peptic ulcer, unspecified site, unspecified as acute or chronic, without mention of hemorrhage, perforation, or obstruction 1975  . Personal history of malignant neoplasm of prostate 12/2000   5 weeks radiation, radiation seeds    . Radiation proctitis   . Sinoatrial node dysfunction ([name])   . Unspecified glaucoma(365.9)   . Unspecified hypothyroidism   . Vitamin B12 deficiency     Surgical History: Past Surgical History:  Procedure Laterality Date  . CARDIAC CATHETERIZATION    . CARDIAC ELECTROPHYSIOLOGY [place] AND ABLATION  2001, 2003   failure  . CATARACT EXTRACTION    . CYSTOSCOPY WITH URETHRAL DILATATION N/A 01/29/2017   Procedure: CYSTOSCOPY WITH URETHRAL DILATATION;  Surgeon: Abbie Sons, MD;  Location: [facility];  Service: Urology;  Laterality: N/A;  . EP IMPLANTABLE DEVICE N/A 02/24/2016   Procedure: PPM Generator Changeout;  Surgeon: Deboraha Sprang, MD;  Location: [facility];  Service: Cardiovascular;  Laterality: N/A;  . gastric ulcer surgery    . HERNIA REPAIR  1994, 2001, 2003   s/p drainage and complications, 11/8919, 03/9415 mesh removal  . INSERT / REPLACE / REMOVE PACEMAKER  07/2005   symptomatic bradycardia  . kidney stones    . NOSE SURGERY     cancer removed   . PROSTATE SURGERY    . SKIN CANCER DESTRUCTION    . TOOTH EXTRACTION      Home Medications:  Allergies as of 10/15/2017      Reactions   Penicillins Rash, Other (See Comments)   Has patient had a PCN reaction causing immediate rash, facial/tongue/throat swelling, SOB or lightheadedness with hypotension: {no Has patient had a PCN reaction causing severe rash involving mucus membranes or skin necrosis: {no Has patient had a PCN reaction that  required hospitalization {no Has patient had a PCN reaction occurring within the last 10 years: {no If all of the above answers are "NO", then may proceed with Cephalosporin use.      Medication List        Accurate as of 10/15/17  2:33 PM. Always use your most recent med list.          acetaminophen 500 MG tablet Commonly known as:  TYLENOL Take 500 mg by mouth every 6 (six) hours as needed for moderate pain or headache.   alendronate 70 MG tablet Commonly known as:   FOSAMAX TAKE 1 TABLET BY MOUTH EVERY 7 DAYS. TAKE WITH A FULL GLASS OF WATER ON AN EMPTY STOMACH   amiodarone 200 MG tablet Commonly known as:  PACERONE TAKE 1/2 TABLET BY MOUTH DAILY   BENEFIBER DRINK MIX PO Take 1 scoop by mouth daily.   clindamycin 300 MG capsule Commonly known as:  CLEOCIN Take 600 mg by mouth See admin instructions. Take 600 mg by mouth 1 hour prior to dental procedure   docusate sodium 50 MG capsule Commonly known as:  COLACE Take 50 mg by mouth 2 (two) times daily as needed for mild constipation.   dorzolamide-timolol 22.3-6.8 MG/ML ophthalmic solution Commonly known as:  COSOPT Place 1 drop into both eyes 2 (two) times daily.   ferrous sulfate 325 (65 FE) MG EC tablet Take 325 mg by mouth daily with breakfast.   levothyroxine 100 MCG tablet Commonly known as:  SYNTHROID, LEVOTHROID TAKE 1 TABLET BY MOUTH DAILY BEFORE BREAKFAST   loperamide 2 MG capsule Commonly known as:  IMODIUM Take 2 mg by mouth as needed for diarrhea or loose stools.   multivitamin tablet Take 1 tablet by mouth daily.   pantoprazole 40 MG tablet Commonly known as:  PROTONIX TAKE 1 TABLET(40 MG) BY MOUTH DAILY   polyethylene glycol packet Commonly known as:  MIRALAX / GLYCOLAX Take 17 g by mouth daily as needed.   pravastatin 40 MG tablet Commonly known as:  PRAVACHOL TAKE ONE-HALF TABLET BY MOUTH DAILY   protein supplement Powd Take 1 scoop by mouth 3 (three) times daily with meals.   simethicone 125 MG chewable tablet Commonly known as:  MYLICON Chew 177 mg by mouth every 6 (six) hours as needed for flatulence.   tamsulosin 0.4 MG Caps capsule Commonly known as:  FLOMAX TAKE 1 CAPSULE(0.4 MG) BY MOUTH DAILY   torsemide 20 MG tablet Commonly known as:  DEMADEX Take 1 tablet (20 mg total) by mouth daily as needed (for fluid).   VITAMIN B-12 IJ Inject 1,000 mcg as directed every 30 (thirty) days.   warfarin 4 MG tablet Commonly known as:  COUMADIN Take as  directed by the anticoagulation clinic. If you are unsure how to take this medication, talk to your nurse or doctor. Original instructions:  Take as directed by coumadin clinic       Allergies:  Allergies  Allergen Reactions  . Penicillins Rash and Other (See Comments)    Has patient had a PCN reaction causing immediate rash, facial/tongue/throat swelling, SOB or lightheadedness with hypotension: {no Has patient had a PCN reaction causing severe rash involving mucus membranes or skin necrosis: {no Has patient had a PCN reaction that required hospitalization {no Has patient had a PCN reaction occurring within the last 10 years: {no If all of the above answers are "NO", then may proceed with Cephalosporin use.    Family History: Family History  Problem Relation  Age of Onset  . Breast cancer Mother   . Bone cancer Father   . Alcohol abuse Unknown   . Coronary artery disease Unknown   . Dementia Unknown   . Diabetes Unknown     Social History:  reports that he has never smoked. He has never used smokeless tobacco. He reports that he does not drink alcohol or use drugs.  ROS: UROLOGY Frequent Urination?: No Hard to postpone urination?: No Burning/pain with urination?: No Get up at night to urinate?: No Leakage of urine?: No Urine stream starts and stops?: No Trouble starting stream?: No Do you have to strain to urinate?: No Blood in urine?: No Urinary tract infection?: No Sexually transmitted disease?: No Injury to kidneys or bladder?: No Painful intercourse?: No Weak stream?: No Erection problems?: No Penile pain?: No  Gastrointestinal Nausea?: No Vomiting?: No Indigestion/heartburn?: No Diarrhea?: No Constipation?: No  Constitutional Fever: No Night sweats?: No Weight loss?: No Fatigue?: No  Skin Skin rash/lesions?: No Itching?: No  Eyes Blurred vision?: No Double vision?: No  Ears/Nose/Throat Sore throat?: No Sinus problems?:  No  Hematologic/Lymphatic Swollen glands?: No Easy bruising?: No  Cardiovascular Leg swelling?: No Chest pain?: No  Respiratory Cough?: No Shortness of breath?: No  Endocrine Excessive thirst?: No  Musculoskeletal Back pain?: No Joint pain?: No  Neurological Headaches?: No Dizziness?: No  Psychologic Depression?: No Anxiety?: No  Physical Exam: BP 128/79   Pulse 91   Ht 5' 8.5" (1.74 m)   Wt 131 lb (59.4 kg)   BMI 19.63 kg/m   Constitutional:  Alert and oriented, No acute distress. HEENT: [place] AT, moist mucus membranes.  Trachea midline, no masses. Cardiovascular: No clubbing, cyanosis, or edema. Respiratory: Normal respiratory effort, no increased work of breathing. GI: Abdomen is soft, nontender, nondistended, no abdominal masses GU: No CVA tenderness Lymph: No cervical or inguinal lymphadenopathy. Skin: No rashes, bruises or suspicious lesions. Neurologic: Grossly intact, no focal deficits, moving all 4 extremities. Psychiatric: Normal mood and affect.    Assessment & Plan:   He is currently doing well and is asymptomatic.  He recently saw Dr. Lorelei Pont for a bump in his creatinine and is to be referred to nephrology.  Follow-up annually or as needed for any significant change.    Abbie Sons, [place] [address] [place], [place] [address] [phone]

## 2017-10-18 ENCOUNTER — Other Ambulatory Visit (INDEPENDENT_AMBULATORY_CARE_PROVIDER_SITE_OTHER): Payer: Medicare Other

## 2017-10-18 DIAGNOSIS — N179 Acute kidney failure, unspecified: Secondary | ICD-10-CM

## 2017-10-18 LAB — BASIC METABOLIC PANEL
BUN: 59 mg/dL — ABNORMAL HIGH (ref 6–23)
CALCIUM: 8.8 mg/dL (ref 8.4–10.5)
CHLORIDE: 109 meq/L (ref 96–112)
CO2: 25 meq/L (ref 19–32)
Creatinine, Ser: 2.3 mg/dL — ABNORMAL HIGH (ref 0.40–1.50)
GFR: 28.37 mL/min — ABNORMAL LOW (ref >60.00)
Glucose, Bld: 114 mg/dL — ABNORMAL HIGH (ref 70–99)
Potassium: 4.1 mEq/L (ref 3.5–5.1)
SODIUM: 140 meq/L (ref 135–145)

## 2017-10-20 ENCOUNTER — Other Ambulatory Visit: Payer: Self-pay | Admitting: Family Medicine

## 2017-10-20 DIAGNOSIS — N183 Chronic kidney disease, stage 3 unspecified: Secondary | ICD-10-CM

## 2017-10-20 DIAGNOSIS — M5030 Other cervical disc degeneration, unspecified cervical region: Secondary | ICD-10-CM

## 2017-10-25 ENCOUNTER — Other Ambulatory Visit (INDEPENDENT_AMBULATORY_CARE_PROVIDER_SITE_OTHER): Payer: Medicare Other

## 2017-10-25 ENCOUNTER — Ambulatory Visit (INDEPENDENT_AMBULATORY_CARE_PROVIDER_SITE_OTHER): Payer: Medicare Other | Admitting: *Deleted

## 2017-10-25 ENCOUNTER — Other Ambulatory Visit: Payer: Self-pay | Admitting: Family Medicine

## 2017-10-25 DIAGNOSIS — I495 Sick sinus syndrome: Secondary | ICD-10-CM

## 2017-10-25 DIAGNOSIS — N183 Chronic kidney disease, stage 3 unspecified: Secondary | ICD-10-CM

## 2017-10-25 LAB — BASIC METABOLIC PANEL
BUN: 42 mg/dL — ABNORMAL HIGH (ref 6–23)
CHLORIDE: 113 meq/L — AB (ref 96–112)
CO2: 22 mEq/L (ref 19–32)
Calcium: 8.6 mg/dL (ref 8.4–10.5)
Creatinine, Ser: 1.94 mg/dL — ABNORMAL HIGH (ref 0.40–1.50)
GFR: 34.53 mL/min — AB (ref >60.00)
Glucose, Bld: 91 mg/dL (ref 70–99)
POTASSIUM: 4.7 meq/L (ref 3.5–5.1)
SODIUM: 140 meq/L (ref 135–145)

## 2017-10-25 NOTE — Progress Notes (Signed)
#  Stop torsemide.  

## 2017-10-25 NOTE — Progress Notes (Signed)
Remote pacemaker transmission.   

## 2017-10-26 ENCOUNTER — Encounter: Payer: Self-pay | Admitting: Cardiology

## 2017-10-27 ENCOUNTER — Ambulatory Visit (INDEPENDENT_AMBULATORY_CARE_PROVIDER_SITE_OTHER): Payer: Medicare Other

## 2017-10-27 DIAGNOSIS — E538 Deficiency of other specified B group vitamins: Secondary | ICD-10-CM

## 2017-10-27 MED ORDER — CYANOCOBALAMIN 1000 MCG/ML IJ SOLN
1000.0000 ug | Freq: Once | INTRAMUSCULAR | Status: AC
Start: 2017-10-27 — End: 2017-10-27
  Administered 2017-10-27: 1000 ug via INTRAMUSCULAR

## 2017-10-27 NOTE — Progress Notes (Signed)
Per orders of Dr. Lorelei Pont, injection of vitamin W73 given by Brenton Grills. Patient tolerated injection well.

## 2017-10-28 ENCOUNTER — Other Ambulatory Visit: Payer: Self-pay | Admitting: *Deleted

## 2017-10-28 DIAGNOSIS — L578 Other skin changes due to chronic exposure to nonionizing radiation: Secondary | ICD-10-CM | POA: Diagnosis not present

## 2017-10-28 DIAGNOSIS — Z1283 Encounter for screening for malignant neoplasm of skin: Secondary | ICD-10-CM | POA: Diagnosis not present

## 2017-10-28 DIAGNOSIS — Z859 Personal history of malignant neoplasm, unspecified: Secondary | ICD-10-CM | POA: Diagnosis not present

## 2017-10-28 DIAGNOSIS — Z85828 Personal history of other malignant neoplasm of skin: Secondary | ICD-10-CM | POA: Diagnosis not present

## 2017-10-28 DIAGNOSIS — L570 Actinic keratosis: Secondary | ICD-10-CM | POA: Diagnosis not present

## 2017-10-28 MED ORDER — FUROSEMIDE 20 MG PO TABS: 20.0000 mg | ORAL_TABLET | ORAL | 1 refills | Status: DC | PRN

## 2017-10-29 LAB — CUP PACEART REMOTE DEVICE CHECK
Battery Remaining Longevity: 95 mo
Battery Voltage: 3.02 V
Brady Statistic AP VP Percent: 9.69 %
Brady Statistic AP VS Percent: 84.64 %
Brady Statistic AS VS Percent: 4.56 %
Brady Statistic RV Percent Paced: 11.17 %
Implantable Lead Implant Date: 20070525
Implantable Lead Location: 753859
Implantable Lead Model: 5076
Lead Channel Impedance Value: 342 Ohm
Lead Channel Impedance Value: 380 Ohm
Lead Channel Impedance Value: 399 Ohm
Lead Channel Pacing Threshold Amplitude: 0.625 V
Lead Channel Pacing Threshold Amplitude: 0.75 V
Lead Channel Pacing Threshold Pulse Width: 0.4 ms
Lead Channel Sensing Intrinsic Amplitude: 1.125 mV
Lead Channel Sensing Intrinsic Amplitude: 1.125 mV
Lead Channel Sensing Intrinsic Amplitude: 1.75 mV
Lead Channel Setting Pacing Amplitude: 2.5 V
Lead Channel Setting Sensing Sensitivity: 0.6 mV
MDC IDC LEAD IMPLANT DT: 20070525
MDC IDC LEAD LOCATION: 753860
MDC IDC MSMT LEADCHNL RA PACING THRESHOLD PULSEWIDTH: 0.4 ms
MDC IDC MSMT LEADCHNL RA SENSING INTR AMPL: 1.75 mV
MDC IDC MSMT LEADCHNL RV IMPEDANCE VALUE: 342 Ohm
MDC IDC PG IMPLANT DT: 20171127
MDC IDC SESS DTM: 20190729161511
MDC IDC SET LEADCHNL RA PACING AMPLITUDE: 2 V
MDC IDC SET LEADCHNL RV PACING PULSEWIDTH: 0.4 ms
MDC IDC STAT BRADY AS VP PERCENT: 1.12 %
MDC IDC STAT BRADY RA PERCENT PACED: 93.62 %

## 2017-11-02 DIAGNOSIS — H353131 Nonexudative age-related macular degeneration, bilateral, early dry stage: Secondary | ICD-10-CM | POA: Diagnosis not present

## 2017-11-03 ENCOUNTER — Ambulatory Visit: Payer: Medicare Other | Admitting: Family Medicine

## 2017-11-08 ENCOUNTER — Ambulatory Visit (INDEPENDENT_AMBULATORY_CARE_PROVIDER_SITE_OTHER): Payer: Medicare Other

## 2017-11-08 DIAGNOSIS — I480 Paroxysmal atrial fibrillation: Secondary | ICD-10-CM | POA: Diagnosis not present

## 2017-11-08 DIAGNOSIS — Z5181 Encounter for therapeutic drug level monitoring: Secondary | ICD-10-CM

## 2017-11-08 LAB — POCT INR: INR: 1.7 — AB (ref 2.0–3.0)

## 2017-11-08 NOTE — Patient Instructions (Signed)
Please take extra tablet today, have a large serving of greens and continue dosage of 1mg  daily except 2mg s on Mondays and Saturdays.  Please be consistent with your greens intake.   Recheck in 4 weeks.

## 2017-11-13 ENCOUNTER — Other Ambulatory Visit: Payer: Self-pay | Admitting: Urology

## 2017-11-25 ENCOUNTER — Encounter: Payer: Self-pay | Admitting: Family Medicine

## 2017-11-25 ENCOUNTER — Ambulatory Visit (INDEPENDENT_AMBULATORY_CARE_PROVIDER_SITE_OTHER): Payer: Medicare Other | Admitting: Family Medicine

## 2017-11-25 VITALS — BP 120/60 | HR 83 | Temp 98.2°F | Ht 68.5 in | Wt 137.5 lb

## 2017-11-25 DIAGNOSIS — N179 Acute kidney failure, unspecified: Secondary | ICD-10-CM | POA: Diagnosis not present

## 2017-11-25 NOTE — Progress Notes (Signed)
Dr. Frederico Hamman T. Alissa Pharr, MD, [facility] [place], [address] Phone: [phone] Fax: [phone]  11/25/2017  Patient: [name] [name], MRN: [id number], DOB: Oct 18, 1922, 84 y.o.  Primary Physician:  Owens Loffler, MD   Chief Complaint  Patient presents with  . Follow-up    3 month   Subjective:   [name] [name] is a 84 y.o. very pleasant male patient who presents with the following:  Wt Readings from Last 3 Encounters:  11/25/17 137 lb 8 oz (62.4 kg)  10/15/17 131 lb (59.4 kg)  10/14/17 129 lb 4 oz (58.6 kg)    F/u AKI, start lasix after d/c torsemide Cr increased to > 2.2 after starting torsemide, which I recently d/c 1 month ago Now with prn lasix and compression  Past Medical History, Surgical History, Social History, Family History, Problem List, Medications, and Allergies have been reviewed and updated if relevant.  Patient Active Problem List   Diagnosis Date Noted  . PACEMAKER, PERMANENT 08/07/2008    Priority: Medium  . PAROXYSMAL ATRIAL FIBRILLATION 02/06/2008    Priority: Medium  . CKD (chronic kidney disease) stage 3, GFR 30-59 ml/min ([name]) 04/13/2017  . DDD (degenerative disc disease), cervical, Severe 06/09/2013  . Neck arthritis, Severe, multi-level 06/09/2013  . Sinoatrial node dysfunction ([name])   . Long term (current) use of anticoagulants 06/24/2010  . IRON DEFICIENCY 04/17/2010  . GLAUCOMA 08/07/2008  . GERD 08/07/2008  . Vitamin B 12 deficiency 07/19/2008  . PEPTIC ULCER DISEASE 07/18/2008  . RADIATION PROCTITIS 07/18/2008  . DIVERTICULOSIS, COLON 07/18/2008  . Prostate cancer, in remission 07/18/2008  . Essential hypertension 05/09/2008  . Hyperlipidemia 02/06/2008  . Hypothyroidism 12/01/2007  . HERN UNS SITE ABD CAV W/O MENTION OBST/GANGREN 12/01/2007    Past Medical History:  Diagnosis Date  . Cancer ([place])   . Cardiac pacemaker in situ 07/2005   symptomatic bradycardia    . CKD (chronic kidney disease) stage 3, GFR 30-59 ml/min ([name]) 04/13/2017  . Diverticulosis of colon (without mention of hemorrhage)   . 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 GERD (gastroesophageal reflux disease)   . GI hemorrhage   . Hyperlipidemia   . Hypertension   . Lower extremity edema   . Neck arthritis, Severe, multi-level 06/09/2013  . Paroxysmal atrial fibrillation ([name])    s/p failed ablation  . Peptic ulcer, unspecified site, unspecified as acute or chronic, without mention of hemorrhage, perforation, or obstruction 1975  . Personal history of malignant neoplasm of prostate 12/2000   5 weeks radiation, radiation seeds  . Radiation proctitis   . Sinoatrial node dysfunction ([name])   . Unspecified glaucoma(365.9)   . Unspecified hypothyroidism   . Vitamin B12 deficiency     Past Surgical History:  Procedure Laterality Date  . CARDIAC CATHETERIZATION    . CARDIAC ELECTROPHYSIOLOGY [place] AND ABLATION  2001, 2003   failure  . CATARACT EXTRACTION    . CYSTOSCOPY WITH URETHRAL DILATATION N/A 01/29/2017   Procedure: CYSTOSCOPY WITH URETHRAL DILATATION;  Surgeon: Abbie Sons, MD;  Location: [facility];  Service: Urology;  Laterality: N/A;  . EP IMPLANTABLE DEVICE N/A 02/24/2016   Procedure: PPM Generator Changeout;  Surgeon: Deboraha Sprang, MD;  Location: [facility];  Service: Cardiovascular;  Laterality: N/A;  . gastric ulcer surgery    . [place], 2001, 2003  s/p drainage and complications, 09/4079, 06/4816 mesh removal  . INSERT / REPLACE / REMOVE PACEMAKER  07/2005   symptomatic bradycardia  . kidney stones    . NOSE SURGERY     cancer removed   . PROSTATE SURGERY    . SKIN CANCER DESTRUCTION    . TOOTH EXTRACTION      Social History   Socioeconomic History  . Marital status: Widowed    Spouse name: Not on file  . Number of children: Not on file  . Years of education:  Not on file  . Highest education level: Not on file  Occupational History  . Not on file  Social Needs  . Financial resource strain: Not on file  . Food insecurity:    Worry: Not on file    Inability: Not on file  . Transportation needs:    Medical: Not on file    Non-medical: Not on file  Tobacco Use  . Smoking status: Never Smoker  . Smokeless tobacco: Never Used  Substance and Sexual Activity  . Alcohol use: No  . Drug use: No  . Sexual activity: Not on file  Lifestyle  . Physical activity:    Days per week: Not on file    Minutes per session: Not on file  . Stress: Not on file  Relationships  . Social connections:    Talks on phone: Not on file    Gets together: Not on file    Attends religious service: Not on file    Active member of club or organization: Not on file    Attends meetings of clubs or organizations: Not on file    Relationship status: Not on file  . Intimate partner violence:    Fear of current or ex partner: Not on file    Emotionally abused: Not on file    Physically abused: Not on file    Forced sexual activity: Not on file  Other Topics Concern  . Not on file  Social History Narrative  . Not on file    Family History  Problem Relation Age of Onset  . Breast cancer Mother   . Bone cancer Father   . Alcohol abuse Unknown   . Coronary artery disease Unknown   . Dementia Unknown   . Diabetes Unknown     Allergies  Allergen Reactions  . Penicillins Rash and Other (See Comments)    Has patient had a PCN reaction causing immediate rash, facial/tongue/throat swelling, SOB or lightheadedness with hypotension: {no Has patient had a PCN reaction causing severe rash involving mucus membranes or skin necrosis: {no Has patient had a PCN reaction that required hospitalization {no Has patient had a PCN reaction occurring within the last 10 years: {no If all of the above answers are "NO", then may proceed with Cephalosporin use.    Medication list  reviewed and updated in full in [facility].   GEN: No acute illnesses, no fevers, chills. GI: No n/v/d, eating normally Pulm: No SOB Interactive and getting along well at home.  Otherwise, ROS is as per the HPI.  Objective:   BP 120/60   Pulse 83   Temp 98.2 F (36.8 C) (Oral)   Ht 5' 8.5" (1.74 m)   Wt 137 lb 8 oz (62.4 kg)   BMI 20.60 kg/m   GEN: WDWN, NAD, Non-toxic, A & O x 3 HEENT: Atraumatic, Normocephalic. Neck supple. No masses, No LAD. Ears and Nose: No external deformity. CV: irreg, irreg,  No M/G/R. No JVD. No thrill. No extra heart sounds. PULM: CTA B, no wheezes, crackles, rhonchi. No retractions. No resp. distress. No accessory muscle use. EXTR: No c/c/tr-1+ le edema NEURO Normal gait.  PSYCH: Normally interactive. Conversant. Not depressed or anxious appearing.  Calm demeanor.   Laboratory and Imaging Data:  Assessment and Plan:   AKI (acute kidney injury) ([place]) - Plan: Basic metabolic panel  We reviewed his cr and GFR trend.  If not improved and stabilized, I would like to get nephrology involved.  Follow-up: No follow-ups on file.  Orders Placed This Encounter  Procedures  . Basic metabolic panel    Signed,  Frederico Hamman T. Idara Woodside, MD   Allergies as of 11/25/2017      Reactions   Penicillins Rash, Other (See Comments)   Has patient had a PCN reaction causing immediate rash, facial/tongue/throat swelling, SOB or lightheadedness with hypotension: {no Has patient had a PCN reaction causing severe rash involving mucus membranes or skin necrosis: {no Has patient had a PCN reaction that required hospitalization {no Has patient had a PCN reaction occurring within the last 10 years: {no If all of the above answers are "NO", then may proceed with Cephalosporin use.      Medication List        Accurate as of 11/25/17 11:59 PM. Always use your most recent med list.          acetaminophen 500 MG tablet Commonly known as:  TYLENOL Take 500 mg  by mouth every 6 (six) hours as needed for moderate pain or headache.   alendronate 70 MG tablet Commonly known as:  FOSAMAX TAKE 1 TABLET BY MOUTH EVERY 7 DAYS. TAKE WITH A FULL GLASS OF WATER ON AN EMPTY STOMACH   amiodarone 200 MG tablet Commonly known as:  PACERONE TAKE 1/2 TABLET BY MOUTH DAILY   BENEFIBER DRINK MIX PO Take 1 scoop by mouth daily.   clindamycin 300 MG capsule Commonly known as:  CLEOCIN Take 600 mg by mouth See admin instructions. Take 600 mg by mouth 1 hour prior to dental procedure   docusate sodium 50 MG capsule Commonly known as:  COLACE Take 50 mg by mouth 2 (two) times daily as needed for mild constipation.   dorzolamide-timolol 22.3-6.8 MG/ML ophthalmic solution Commonly known as:  COSOPT Place 1 drop into both eyes 2 (two) times daily.   ferrous sulfate 325 (65 FE) MG EC tablet Take 325 mg by mouth daily with breakfast.   furosemide 20 MG tablet Commonly known as:  LASIX Take 1 tablet (20 mg total) by mouth every other day as needed.   levothyroxine 100 MCG tablet Commonly known as:  SYNTHROID, LEVOTHROID TAKE 1 TABLET BY MOUTH DAILY BEFORE BREAKFAST   loperamide 2 MG capsule Commonly known as:  IMODIUM Take 2 mg by mouth as needed for diarrhea or loose stools.   multivitamin tablet Take 1 tablet by mouth daily.   pantoprazole 40 MG tablet Commonly known as:  PROTONIX TAKE 1 TABLET(40 MG) BY MOUTH DAILY   polyethylene glycol packet Commonly known as:  MIRALAX / GLYCOLAX Take 17 g by mouth daily as needed.   pravastatin 40 MG tablet Commonly known as:  PRAVACHOL TAKE ONE-HALF TABLET BY MOUTH DAILY   protein supplement Powd Take 1 scoop by mouth 3 (three) times daily with meals.   simethicone 125 MG chewable tablet Commonly known as:  MYLICON Chew 725 mg by mouth every 6 (six) hours as needed for flatulence.  tamsulosin 0.4 MG Caps capsule Commonly known as:  FLOMAX TAKE 1 CAPSULE(0.4 MG) BY MOUTH DAILY   VITAMIN B-12  IJ Inject 1,000 mcg as directed every 30 (thirty) days.   warfarin 4 MG tablet Commonly known as:  COUMADIN Take as directed by the anticoagulation clinic. If you are unsure how to take this medication, talk to your nurse or doctor. Original instructions:  Take as directed by coumadin clinic

## 2017-11-26 LAB — BASIC METABOLIC PANEL
BUN: 39 mg/dL — ABNORMAL HIGH (ref 6–23)
CALCIUM: 8.5 mg/dL (ref 8.4–10.5)
CO2: 22 meq/L (ref 19–32)
Chloride: 111 mEq/L (ref 96–112)
Creatinine, Ser: 1.93 mg/dL — ABNORMAL HIGH (ref 0.40–1.50)
GFR: 34.73 mL/min — AB (ref >60.00)
Glucose, Bld: 85 mg/dL (ref 70–99)
Potassium: 3.8 mEq/L (ref 3.5–5.1)
Sodium: 140 mEq/L (ref 135–145)

## 2017-12-02 ENCOUNTER — Ambulatory Visit (INDEPENDENT_AMBULATORY_CARE_PROVIDER_SITE_OTHER): Payer: Medicare Other | Admitting: *Deleted

## 2017-12-02 DIAGNOSIS — E538 Deficiency of other specified B group vitamins: Secondary | ICD-10-CM | POA: Diagnosis not present

## 2017-12-02 MED ORDER — CYANOCOBALAMIN 1000 MCG/ML IJ SOLN
1000.0000 ug | Freq: Once | INTRAMUSCULAR | Status: AC
  Administered 2017-12-02: 1000 ug via INTRAMUSCULAR

## 2017-12-02 NOTE — Progress Notes (Signed)
Per orders of Dr. Diona Browner, injection of b12 given by Modena Nunnery. Patient tolerated injection well.

## 2017-12-06 ENCOUNTER — Ambulatory Visit (INDEPENDENT_AMBULATORY_CARE_PROVIDER_SITE_OTHER): Payer: Medicare Other

## 2017-12-06 DIAGNOSIS — Z5181 Encounter for therapeutic drug level monitoring: Secondary | ICD-10-CM

## 2017-12-06 DIAGNOSIS — I480 Paroxysmal atrial fibrillation: Secondary | ICD-10-CM | POA: Diagnosis not present

## 2017-12-06 LAB — POCT INR: INR: 2.1 (ref 2.0–3.0)

## 2017-12-06 NOTE — Patient Instructions (Signed)
Please continue dosage of 1mg  daily except 2mg s on Mondays and Saturdays.  Please be consistent with your greens intake.   Recheck in 5 weeks.

## 2017-12-10 ENCOUNTER — Other Ambulatory Visit: Payer: Self-pay | Admitting: Internal Medicine

## 2017-12-15 ENCOUNTER — Other Ambulatory Visit: Payer: Self-pay | Admitting: Urology

## 2018-01-05 ENCOUNTER — Ambulatory Visit (INDEPENDENT_AMBULATORY_CARE_PROVIDER_SITE_OTHER): Payer: Medicare Other

## 2018-01-05 DIAGNOSIS — E538 Deficiency of other specified B group vitamins: Secondary | ICD-10-CM

## 2018-01-05 MED ORDER — CYANOCOBALAMIN 1000 MCG/ML IJ SOLN
1000.0000 ug | Freq: Once | INTRAMUSCULAR | Status: AC
  Administered 2018-01-05: 1000 ug via INTRAMUSCULAR

## 2018-01-05 NOTE — Progress Notes (Signed)
Per orders of Dr. Lorelei Pont, injection of Vit B 12 given by Ozzie Hoyle. Patient tolerated injection well.

## 2018-01-06 ENCOUNTER — Ambulatory Visit (INDEPENDENT_AMBULATORY_CARE_PROVIDER_SITE_OTHER): Payer: Medicare Other

## 2018-01-06 DIAGNOSIS — Z23 Encounter for immunization: Secondary | ICD-10-CM | POA: Diagnosis not present

## 2018-01-10 ENCOUNTER — Ambulatory Visit (INDEPENDENT_AMBULATORY_CARE_PROVIDER_SITE_OTHER): Payer: Medicare Other

## 2018-01-10 DIAGNOSIS — Z5181 Encounter for therapeutic drug level monitoring: Secondary | ICD-10-CM | POA: Diagnosis not present

## 2018-01-10 DIAGNOSIS — I480 Paroxysmal atrial fibrillation: Secondary | ICD-10-CM | POA: Diagnosis not present

## 2018-01-10 LAB — POCT INR: INR: 2.6 (ref 2.0–3.0)

## 2018-01-10 NOTE — Patient Instructions (Signed)
Please continue dosage of 1mg  daily except 2mg s on Mondays and Saturdays.  Please be consistent with your greens intake.   Recheck in 6 weeks

## 2018-01-17 ENCOUNTER — Ambulatory Visit: Payer: Medicare Other | Admitting: Family Medicine

## 2018-01-24 ENCOUNTER — Ambulatory Visit (INDEPENDENT_AMBULATORY_CARE_PROVIDER_SITE_OTHER): Payer: Medicare Other | Admitting: *Deleted

## 2018-01-24 DIAGNOSIS — I480 Paroxysmal atrial fibrillation: Secondary | ICD-10-CM

## 2018-01-24 DIAGNOSIS — I495 Sick sinus syndrome: Secondary | ICD-10-CM

## 2018-01-24 LAB — CUP PACEART REMOTE DEVICE CHECK
Battery Voltage: 3.01 V
Brady Statistic AP VP Percent: 12.31 %
Brady Statistic AS VP Percent: 3.09 %
Brady Statistic RA Percent Paced: 86.77 %
Implantable Lead Implant Date: 20070525
Implantable Lead Location: 753859
Implantable Lead Model: 5076
Implantable Lead Model: 5076
Lead Channel Impedance Value: 323 Ohm
Lead Channel Impedance Value: 361 Ohm
Lead Channel Impedance Value: 399 Ohm
Lead Channel Sensing Intrinsic Amplitude: 1.625 mV
Lead Channel Sensing Intrinsic Amplitude: 1.625 mV
Lead Channel Sensing Intrinsic Amplitude: 2.75 mV
Lead Channel Setting Pacing Amplitude: 2.5 V
Lead Channel Setting Sensing Sensitivity: 0.6 mV
MDC IDC LEAD IMPLANT DT: 20070525
MDC IDC LEAD LOCATION: 753860
MDC IDC MSMT BATTERY REMAINING LONGEVITY: 94 mo
MDC IDC MSMT LEADCHNL RA PACING THRESHOLD AMPLITUDE: 0.75 V
MDC IDC MSMT LEADCHNL RA PACING THRESHOLD PULSEWIDTH: 0.4 ms
MDC IDC MSMT LEADCHNL RV IMPEDANCE VALUE: 342 Ohm
MDC IDC MSMT LEADCHNL RV PACING THRESHOLD AMPLITUDE: 0.75 V
MDC IDC MSMT LEADCHNL RV PACING THRESHOLD PULSEWIDTH: 0.4 ms
MDC IDC MSMT LEADCHNL RV SENSING INTR AMPL: 2.75 mV
MDC IDC PG IMPLANT DT: 20171127
MDC IDC SESS DTM: 20191028123846
MDC IDC SET LEADCHNL RA PACING AMPLITUDE: 2 V
MDC IDC SET LEADCHNL RV PACING PULSEWIDTH: 0.4 ms
MDC IDC STAT BRADY AP VS PERCENT: 75.57 %
MDC IDC STAT BRADY AS VS PERCENT: 9.03 %
MDC IDC STAT BRADY RV PERCENT PACED: 15.95 %

## 2018-01-24 NOTE — Progress Notes (Signed)
Electrophysiology Office Note    Date:  01/25/2018   ID:  [name] [name], DOB Dec 03, 1922, MRN [id number]  PCP:  Owens Loffler, MD  Cardiologist:  Primary Electrophysiologist:  Virl Axe, MD    Chief Complaint  Patient presents with  . other    6 mo f/u. Medications reviewed verbally.      History of Present Illness: [name] [name] is a 84 y.o. male is seen today in followup for  pacemaker implanted for tachybradycardia syndrome in  the context of paroxysmal atrial fibrillation with prior failed ablation. He takes amiodarone. He has had mostly sinus ( 97%) from interrogation 7/17  He underwent device generator replacement 11/17  He has had significant increases in peripheral edema.  He attributes this to the fact that he is helping his brother moving to assisted living and has been on his feet more than normal.  His diuretic dose of 20 mg frequently does not cause urination.  Denies shortness of breath or chest pain but does have significant fatigue and is often just tired.   Date Cr K TSH LFTs Hgb PFTs  7/17    0.71      8/18     12.3   1/19    0.3 15 10.5    8/19 1.93 3.8 0.21  10.4             His last TSH was low.  T4 and T3 were in the normal/low range respectively.  Dr. Arlyn Dunning notes were reviewed and he wants to keep the thyroid replacement stable  Past Medical History:  Diagnosis Date  . Cancer ([place])   . Cardiac pacemaker in situ 07/2005   symptomatic bradycardia  . CKD (chronic kidney disease) stage 3, GFR 30-59 ml/min ([name]) 04/13/2017  . Diverticulosis of colon (without mention of hemorrhage)   . 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 GERD (gastroesophageal reflux disease)   . GI hemorrhage   . Hyperlipidemia   . Hypertension   . Lower extremity edema   . Neck arthritis, Severe, multi-level 06/09/2013  . Paroxysmal atrial fibrillation ([name])    s/p failed ablation  . Peptic  ulcer, unspecified site, unspecified as acute or chronic, without mention of hemorrhage, perforation, or obstruction 1975  . Personal history of malignant neoplasm of prostate 12/2000   5 weeks radiation, radiation seeds  . Radiation proctitis   . Sinoatrial node dysfunction ([name])   . Unspecified glaucoma(365.9)   . Unspecified hypothyroidism   . Vitamin B12 deficiency    Past Surgical History:  Procedure Laterality Date  . CARDIAC CATHETERIZATION    . CARDIAC ELECTROPHYSIOLOGY [place] AND ABLATION  2001, 2003   failure  . CATARACT EXTRACTION    . CYSTOSCOPY WITH URETHRAL DILATATION N/A 01/29/2017   Procedure: CYSTOSCOPY WITH URETHRAL DILATATION;  Surgeon: Abbie Sons, MD;  Location: [facility] ORS;  Service: Urology;  Laterality: N/A;  . EP IMPLANTABLE DEVICE N/A 02/24/2016   Procedure: PPM Generator Changeout;  Surgeon: Deboraha Sprang, MD;  Location: [place] [facility];  Service: Cardiovascular;  Laterality: N/A;  . gastric ulcer surgery    . HERNIA REPAIR  1994, 2001, 2003   s/p drainage and complications, 08/7207, 06/7094 mesh removal  . INSERT / REPLACE / REMOVE PACEMAKER  07/2005   symptomatic bradycardia  . kidney stones    . NOSE SURGERY     cancer removed   .  PROSTATE SURGERY    . SKIN CANCER DESTRUCTION    . TOOTH EXTRACTION       Current Outpatient Medications  Medication Sig Dispense Refill  . acetaminophen (TYLENOL) 500 MG tablet Take 500 mg by mouth every 6 (six) hours as needed for moderate pain or headache.    . alendronate (FOSAMAX) 70 MG tablet TAKE 1 TABLET BY MOUTH EVERY 7 DAYS. TAKE WITH A FULL GLASS OF WATER ON AN EMPTY STOMACH 12 tablet 3  . amiodarone (PACERONE) 200 MG tablet TAKE 1/2 TABLET BY MOUTH DAILY 45 tablet 1  . clindamycin (CLEOCIN) 300 MG capsule Take 600 mg by mouth See admin instructions. Take 600 mg by mouth 1 hour prior to dental procedure    . Cyanocobalamin (VITAMIN B-12 IJ) Inject 1,000 mcg as directed every 30 (thirty) days.     Marland Kitchen docusate  sodium (COLACE) 50 MG capsule Take 50 mg by mouth 2 (two) times daily as needed for mild constipation.    . dorzolamide-timolol (COSOPT) 22.3-6.8 MG/ML ophthalmic solution Place 1 drop into both eyes 2 (two) times daily.      . ferrous sulfate 325 (65 FE) MG EC tablet Take 325 mg by mouth daily with breakfast.      . furosemide (LASIX) 20 MG tablet Take 1 tablet (20 mg total) by mouth every other day as needed. 30 tablet 1  . levothyroxine (SYNTHROID, LEVOTHROID) 100 MCG tablet TAKE 1 TABLET BY MOUTH DAILY BEFORE BREAKFAST 30 tablet 11  . loperamide (IMODIUM) 2 MG capsule Take 2 mg by mouth as needed for diarrhea or loose stools.    . Multiple Vitamin (MULTIVITAMIN) tablet Take 1 tablet by mouth daily.      . pantoprazole (PROTONIX) 40 MG tablet TAKE 1 TABLET(40 MG) BY MOUTH DAILY 90 tablet 1  . polyethylene glycol (MIRALAX / GLYCOLAX) packet Take 17 g by mouth daily as needed.    . pravastatin (PRAVACHOL) 40 MG tablet TAKE ONE-HALF TABLET BY MOUTH DAILY 45 tablet 1  . protein supplement (RESOURCE BENEPROTEIN) POWD Take 1 scoop by mouth 3 (three) times daily with meals.    . simethicone (MYLICON) 093 MG chewable tablet Chew 125 mg by mouth every 6 (six) hours as needed for flatulence.    . tamsulosin (FLOMAX) 0.4 MG CAPS capsule TAKE 1 CAPSULE(0.4 MG) BY MOUTH DAILY 30 capsule 3  . warfarin (COUMADIN) 4 MG tablet Take as directed by coumadin clinic 90 tablet 1  . Wheat Dextrin (BENEFIBER DRINK MIX PO) Take 1 scoop by mouth daily.      No current facility-administered medications for this visit.     Allergies:   Penicillins   Social History:  The patient  reports that he has never smoked. He has never used smokeless tobacco. He reports that he does not drink alcohol or use drugs.   Family History:  The patient's    family history includes Alcohol abuse in his unknown relative; Bone cancer in his father; Breast cancer in his mother; Coronary artery disease in his unknown relative; Dementia in  his unknown relative; Diabetes in his unknown relative.    ROS:  Please see the history of present illness and past medical histor .   All other systems are reviewed and negative.    PHYSICAL EXAM: VS:  BP (!) 144/70 (BP Location: Left Arm, Patient Position: Sitting, Cuff Size: Normal)   Pulse 67   Ht 5\' 9"  (1.753 m)   Wt 137 lb 8 oz (62.4 kg)  BMI 20.31 kg/m  , BMI Body mass index is 20.31 kg/m. Well developed and nourished in no acute distress HENT normal Neck supple with JVP-7-8 cm  clear Irregular regular rate and rhythm, no murmurs or gallops Abd-soft with active BS No Clubbing cyanosis edema Skin-warm and dry+ L>R A & Oriented  Grossly normal sensory and motor function    ECG atrial paced at 67 Intervals 28/10/43 PVCs-frequent Device interrogation is reviewed today in detail.  See PaceArt for details.      Lipid Panel     Component Value Date/Time   CHOL 123 10/08/2017 1438   TRIG 49.0 10/08/2017 1438   HDL 60.20 10/08/2017 1438   CHOLHDL 2 10/08/2017 1438   VLDL 9.8 10/08/2017 1438   LDLCALC 53 10/08/2017 1438     Wt Readings from Last 3 Encounters:  01/25/18 137 lb 8 oz (62.4 kg)  11/25/17 137 lb 8 oz (62.4 kg)  10/15/17 131 lb (59.4 kg)      Other studies Reviewed: As above    ASSESSMENT AND PLAN:  Atrial fibrillation - paroxysmal  Sinus node dysfunction   Pacemaker  Medtronic  The patient's device was interrogated.  The information was reviewed. No changes were made in the programming.     Hypothyroidism-treated  Amiodarone therapy  anemia.  Edema-chronic  Renal dysfunction gd 3       Intermittent atrial fibrillation on blood thinner without obvious bleeding  Treated hypothyroidism with a low TSH.  Will ask Dr. Edilia Bo to consider down titration of thyroid replacement  Continue amiodarone therapy tolerating well.  Edema is increased and weight is up about 8 pounds.  We will have him increase his diuretics from 20 qod--40 qod       Signed, Virl Axe, MD  01/25/2018 10:11 AM     [facility] [address] [place] [address] [phone] (office) [phone] (fax)

## 2018-01-24 NOTE — Progress Notes (Signed)
Remote pacemaker transmission.   

## 2018-01-25 ENCOUNTER — Ambulatory Visit (INDEPENDENT_AMBULATORY_CARE_PROVIDER_SITE_OTHER): Payer: Medicare Other | Admitting: Internal Medicine

## 2018-01-25 ENCOUNTER — Encounter: Payer: Self-pay | Admitting: Internal Medicine

## 2018-01-25 VITALS — BP 144/70 | HR 67 | Ht 69.0 in | Wt 137.5 lb

## 2018-01-25 DIAGNOSIS — I480 Paroxysmal atrial fibrillation: Secondary | ICD-10-CM

## 2018-01-25 DIAGNOSIS — Z950 Presence of cardiac pacemaker: Secondary | ICD-10-CM | POA: Diagnosis not present

## 2018-01-25 DIAGNOSIS — I495 Sick sinus syndrome: Secondary | ICD-10-CM

## 2018-01-25 DIAGNOSIS — Z79899 Other long term (current) drug therapy: Secondary | ICD-10-CM | POA: Diagnosis not present

## 2018-01-25 LAB — CUP PACEART INCLINIC DEVICE CHECK
Battery Remaining Longevity: 94 mo
Battery Voltage: 3.01 V
Brady Statistic AP VP Percent: 10.43 %
Brady Statistic AP VS Percent: 81.06 %
Brady Statistic AS VP Percent: 2.01 %
Brady Statistic AS VS Percent: 6.51 %
Date Time Interrogation Session: 20191029133625
Implantable Lead Implant Date: 20070525
Implantable Lead Location: 753860
Implantable Lead Model: 5076
Lead Channel Impedance Value: 342 Ohm
Lead Channel Impedance Value: 399 Ohm
Lead Channel Pacing Threshold Amplitude: 0.75 V
Lead Channel Pacing Threshold Amplitude: 0.75 V
Lead Channel Pacing Threshold Pulse Width: 0.4 ms
Lead Channel Sensing Intrinsic Amplitude: 1.75 mV
Lead Channel Sensing Intrinsic Amplitude: 1.9 mV
Lead Channel Setting Pacing Pulse Width: 0.4 ms
MDC IDC LEAD IMPLANT DT: 20070525
MDC IDC LEAD LOCATION: 753859
MDC IDC MSMT LEADCHNL RA IMPEDANCE VALUE: 361 Ohm
MDC IDC MSMT LEADCHNL RA PACING THRESHOLD PULSEWIDTH: 0.4 ms
MDC IDC MSMT LEADCHNL RV IMPEDANCE VALUE: 342 Ohm
MDC IDC PG IMPLANT DT: 20171127
MDC IDC SET LEADCHNL RA PACING AMPLITUDE: 2 V
MDC IDC SET LEADCHNL RV PACING AMPLITUDE: 2.5 V
MDC IDC SET LEADCHNL RV SENSING SENSITIVITY: 0.6 mV
MDC IDC STAT BRADY RA PERCENT PACED: 90.59 %
MDC IDC STAT BRADY RV PERCENT PACED: 12.88 %

## 2018-01-25 MED ORDER — FUROSEMIDE 20 MG PO TABS: ORAL_TABLET | ORAL | 1 refills | Status: DC

## 2018-01-25 NOTE — Patient Instructions (Signed)
Medication Instructions:  - Your physician has recommended you make the following change in your medication:   1) INCREASE lasix (furosemide) 20 mg- take 2 tablets (40 mg) by mouth once every other day  If you need a refill on your cardiac medications before your next appointment, please call your pharmacy.   Lab work: - none ordered  If you have labs (blood work) drawn today and your tests are completely normal, you will receive your results only by: Marland Kitchen MyChart Message (if you have MyChart) OR . A paper copy in the mail If you have any lab test that is abnormal or we need to change your treatment, we will call you to review the results.  Testing/Procedures: - none ordered  Follow-Up: At [facility], you and your health needs are our priority.  As part of our continuing mission to provide you with exceptional heart care, we have created designated Provider Care Teams.  These Care Teams include your primary Cardiologist (physician) and Advanced Practice Providers (APPs -  Physician Assistants and Nurse Practitioners) who all work together to provide you with the care you need, when you need it. . You will need a follow up appointment in 1 year with Dr. Caryl Comes  Please call our office 2 months in advance to schedule this appointment.    Remote monitoring is used to monitor your Pacemaker of ICD from home. This monitoring reduces the number of office visits required to check your device to one time per year. It allows [place] to keep an eye on the functioning of your device to ensure it is working properly. You are scheduled for a device check from home on 04/25/18. You may send your transmission at any time that day. If you have a wireless device, the transmission will be sent automatically. After your physician reviews your transmission, you will receive a postcard with your next transmission date.   Any Other Special Instructions Will Be Listed Below (If Applicable). - N/A

## 2018-02-09 ENCOUNTER — Ambulatory Visit (INDEPENDENT_AMBULATORY_CARE_PROVIDER_SITE_OTHER): Payer: Medicare Other | Admitting: *Deleted

## 2018-02-09 DIAGNOSIS — E538 Deficiency of other specified B group vitamins: Secondary | ICD-10-CM | POA: Diagnosis not present

## 2018-02-09 MED ORDER — CYANOCOBALAMIN 1000 MCG/ML IJ SOLN
1000.0000 ug | Freq: Once | INTRAMUSCULAR | Status: AC
  Administered 2018-02-09: 1000 ug via INTRAMUSCULAR

## 2018-02-09 NOTE — Progress Notes (Signed)
Per orders of Dr. Lorelei Pont, injection of Vitamin B12 given by Lauralyn Primes. Patient tolerated injection well.

## 2018-02-21 ENCOUNTER — Ambulatory Visit (INDEPENDENT_AMBULATORY_CARE_PROVIDER_SITE_OTHER): Payer: Medicare Other

## 2018-02-21 DIAGNOSIS — I480 Paroxysmal atrial fibrillation: Secondary | ICD-10-CM | POA: Diagnosis not present

## 2018-02-21 DIAGNOSIS — Z5181 Encounter for therapeutic drug level monitoring: Secondary | ICD-10-CM

## 2018-02-21 LAB — POCT INR: INR: 2.1 (ref 2.0–3.0)

## 2018-02-21 NOTE — Patient Instructions (Signed)
Please continue dosage of 1mg  daily except 2mg s on Mondays and Saturdays.  Please be consistent with your greens intake.   Recheck in 6 weeks.

## 2018-03-14 ENCOUNTER — Other Ambulatory Visit: Payer: Self-pay | Admitting: Family Medicine

## 2018-03-16 ENCOUNTER — Ambulatory Visit (INDEPENDENT_AMBULATORY_CARE_PROVIDER_SITE_OTHER): Payer: Medicare Other | Admitting: *Deleted

## 2018-03-16 DIAGNOSIS — E538 Deficiency of other specified B group vitamins: Secondary | ICD-10-CM

## 2018-03-16 MED ORDER — CYANOCOBALAMIN 1000 MCG/ML IJ SOLN
1000.0000 ug | Freq: Once | INTRAMUSCULAR | Status: AC
  Administered 2018-03-16: 1000 ug via INTRAMUSCULAR

## 2018-03-16 NOTE — Progress Notes (Signed)
Per orders of Dr. Lorelei Pont, injection of b12 given by Modena Nunnery. Patient tolerated injection well.

## 2018-04-01 ENCOUNTER — Other Ambulatory Visit: Payer: Self-pay | Admitting: Family Medicine

## 2018-04-04 ENCOUNTER — Ambulatory Visit (INDEPENDENT_AMBULATORY_CARE_PROVIDER_SITE_OTHER): Payer: Medicare Other

## 2018-04-04 DIAGNOSIS — I480 Paroxysmal atrial fibrillation: Secondary | ICD-10-CM

## 2018-04-04 DIAGNOSIS — Z5181 Encounter for therapeutic drug level monitoring: Secondary | ICD-10-CM | POA: Diagnosis not present

## 2018-04-04 LAB — POCT INR: INR: 2 (ref 2.0–3.0)

## 2018-04-04 NOTE — Patient Instructions (Signed)
Please continue dosage of 1mg  daily except 2mg s on Mondays and Saturdays.  Please be consistent with your greens intake.   Recheck in 6 weeks.

## 2018-04-05 ENCOUNTER — Telehealth: Payer: Self-pay | Admitting: Family Medicine

## 2018-04-05 MED ORDER — TAMSULOSIN HCL 0.4 MG PO CAPS: ORAL_CAPSULE | ORAL | 1 refills | Status: DC

## 2018-04-05 NOTE — Telephone Encounter (Signed)
Refill for 90 day supply sent as requested.

## 2018-04-05 NOTE — Addendum Note (Signed)
Addended by: Carter Kitten on: 04/05/2018 02:37 PM   Modules accepted: Orders

## 2018-04-05 NOTE — Telephone Encounter (Signed)
Pt is calling to request a refill on the Tamsulosin. Pt is requesting Dr.Copland to send in a 90 day supply because it is a better deal financially. Pt uses [facility] on [address] in [place].

## 2018-04-07 ENCOUNTER — Telehealth: Payer: Self-pay | Admitting: Family Medicine

## 2018-04-07 NOTE — Telephone Encounter (Signed)
I left msg for pt to return my call. He needs to r/s his 1/14 appt with L Pinson.

## 2018-04-08 ENCOUNTER — Ambulatory Visit (INDEPENDENT_AMBULATORY_CARE_PROVIDER_SITE_OTHER): Payer: Medicare Other

## 2018-04-08 VITALS — BP 110/78 | HR 62 | Temp 97.6°F | Ht 69.5 in | Wt 134.8 lb

## 2018-04-08 DIAGNOSIS — E039 Hypothyroidism, unspecified: Secondary | ICD-10-CM | POA: Diagnosis not present

## 2018-04-08 DIAGNOSIS — E559 Vitamin D deficiency, unspecified: Secondary | ICD-10-CM

## 2018-04-08 DIAGNOSIS — Z0000 Encounter for general adult medical examination without abnormal findings: Secondary | ICD-10-CM

## 2018-04-08 DIAGNOSIS — I10 Essential (primary) hypertension: Secondary | ICD-10-CM | POA: Diagnosis not present

## 2018-04-08 DIAGNOSIS — E785 Hyperlipidemia, unspecified: Secondary | ICD-10-CM | POA: Diagnosis not present

## 2018-04-08 LAB — BASIC METABOLIC PANEL
BUN: 32 mg/dL — ABNORMAL HIGH (ref 6–23)
CO2: 28 mEq/L (ref 19–32)
Calcium: 9.1 mg/dL (ref 8.4–10.5)
Chloride: 106 mEq/L (ref 96–112)
Creatinine, Ser: 2.01 mg/dL — ABNORMAL HIGH (ref 0.40–1.50)
GFR: 33.11 mL/min — ABNORMAL LOW (ref >60.00)
Glucose, Bld: 117 mg/dL — ABNORMAL HIGH (ref 70–99)
Potassium: 3.3 mEq/L — ABNORMAL LOW (ref 3.5–5.1)
Sodium: 143 mEq/L (ref 135–145)

## 2018-04-08 LAB — CBC WITH DIFFERENTIAL/PLATELET
Basophils Absolute: 0 10*3/uL (ref 0.0–0.1)
Basophils Relative: 0.9 % (ref 0.0–3.0)
Eosinophils Absolute: 0.2 10*3/uL (ref 0.0–0.7)
Eosinophils Relative: 3.7 % (ref 0.0–5.0)
HCT: 33.4 % — ABNORMAL LOW (ref 39.0–52.0)
Hemoglobin: 11 g/dL — ABNORMAL LOW (ref 13.0–17.0)
LYMPHS ABS: 1.5 10*3/uL (ref 0.7–4.0)
Lymphocytes Relative: 27.5 % (ref 12.0–46.0)
MCHC: 32.9 g/dL (ref 30.0–36.0)
MCV: 100.1 fl — ABNORMAL HIGH (ref 78.0–100.0)
Monocytes Absolute: 0.5 10*3/uL (ref 0.1–1.0)
Monocytes Relative: 9.9 % (ref 3.0–12.0)
NEUTROS PCT: 58 % (ref 43.0–77.0)
Neutro Abs: 3.1 10*3/uL (ref 1.4–7.7)
Platelets: 196 10*3/uL (ref 150.0–400.0)
RBC: 3.34 Mil/uL — ABNORMAL LOW (ref 4.22–5.81)
RDW: 13.8 % (ref 11.5–15.5)
WBC: 5.4 10*3/uL (ref 4.0–10.5)

## 2018-04-08 LAB — LIPID PANEL
Cholesterol: 132 mg/dL (ref 0–200)
HDL: 68.5 mg/dL (ref >39.00)
LDL Cholesterol: 53 mg/dL (ref 0–99)
NONHDL: 63.46
Total CHOL/HDL Ratio: 2
Triglycerides: 50 mg/dL (ref 0.0–149.0)
VLDL: 10 mg/dL (ref 0.0–40.0)

## 2018-04-08 LAB — HEPATIC FUNCTION PANEL
ALK PHOS: 96 U/L (ref 39–117)
ALT: 21 U/L (ref 0–53)
AST: 25 U/L (ref 0–37)
Albumin: 3.6 g/dL (ref 3.5–5.2)
Bilirubin, Direct: 0.2 mg/dL (ref 0.0–0.3)
Total Bilirubin: 0.8 mg/dL (ref 0.2–1.2)
Total Protein: 6.4 g/dL (ref 6.0–8.3)

## 2018-04-08 LAB — T4, FREE: Free T4: 1.56 ng/dL (ref 0.60–1.60)

## 2018-04-08 LAB — VITAMIN D 25 HYDROXY (VIT D DEFICIENCY, FRACTURES): VITD: 55.51 ng/mL (ref 30.00–100.00)

## 2018-04-08 LAB — TSH: TSH: 0.4 u[IU]/mL (ref 0.35–4.50)

## 2018-04-08 LAB — T3, FREE: T3, Free: 2.1 pg/mL — ABNORMAL LOW (ref 2.3–4.2)

## 2018-04-08 NOTE — Progress Notes (Signed)
I reviewed health advisor's note, was available for consultation, and agree with documentation and plan.   Signed,  Sanvika Cuttino T. Kevonna Nolte, MD  

## 2018-04-08 NOTE — Progress Notes (Signed)
Subjective:   [name] [name] is a 85 y.o. male who presents for Medicare Annual/Subsequent preventive examination.  Review of Systems:  N/A Cardiac Risk Factors include: advanced age (>70men, >26 women);male gender;dyslipidemia;hypertension     Objective:    Vitals: BP 110/78 (BP Location: Left Arm, Patient Position: Sitting, Cuff Size: Normal)   Pulse 62   Temp 97.6 F (36.4 C) (Oral)   Ht 5' 9.5" (1.765 m) Comment: shoes  Wt 134 lb 12 oz (61.1 kg)   SpO2 98%   BMI 19.61 kg/m   Body mass index is 19.61 kg/m.  Advanced Directives 04/08/2018 04/06/2017 01/29/2017 01/22/2017 11/06/2016 02/24/2016  Does Patient Have a Medical Advance Directive? Yes Yes Yes Yes No Yes  Type of Paramedic of [place];Living will [place];Living will [place];Living will [place];Living will - [place];Living will  Does patient want to make changes to medical advance directive? - - No - Patient declined - - No - Patient declined  Copy of [place] in Chart? No - copy requested Yes No - copy requested - - No - copy requested  Would patient like information on creating a medical advance directive? - - - - Yes (ED - Information included in AVS) -    Tobacco Social History   Tobacco Use  Smoking Status Never Smoker  Smokeless Tobacco Never Used     Counseling given: No   Clinical Intake:  Pre-visit preparation completed: Yes  Pain : No/denies pain Pain Score: 0-No pain     Nutritional Status: BMI of 19-24  Normal Nutritional Risks: None Diabetes: No  How often do you need to have someone help you when you read instructions, pamphlets, or other written materials from your doctor or pharmacy?: 1 - Never What is the last grade level you completed in school?: Bachelor degree  Interpreter Needed?: No  Comments: pt is a widower and lives alone Information entered by ::  LPinson, LPN  Past Medical History:  Diagnosis Date  . Cancer ([place])   . Cardiac pacemaker in situ 07/2005   symptomatic bradycardia  . CKD (chronic kidney disease) stage 3, GFR 30-59 ml/min ([name]) 04/13/2017  . Diverticulosis of colon (without mention of hemorrhage)   . 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 GERD (gastroesophageal reflux disease)   . GI hemorrhage   . Hyperlipidemia   . Hypertension   . Lower extremity edema   . Neck arthritis, Severe, multi-level 06/09/2013  . Paroxysmal atrial fibrillation ([name])    s/p failed ablation  . Peptic ulcer, unspecified site, unspecified as acute or chronic, without mention of hemorrhage, perforation, or obstruction 1975  . Personal history of malignant neoplasm of prostate 12/2000   5 weeks radiation, radiation seeds  . Radiation proctitis   . Sinoatrial node dysfunction ([name])   . Unspecified glaucoma(365.9)   . Unspecified hypothyroidism   . Vitamin B12 deficiency    Past Surgical History:  Procedure Laterality Date  . CARDIAC CATHETERIZATION    . CARDIAC ELECTROPHYSIOLOGY [place] AND ABLATION  2001, 2003   failure  . CATARACT EXTRACTION    . CYSTOSCOPY WITH URETHRAL DILATATION N/A 01/29/2017   Procedure: CYSTOSCOPY WITH URETHRAL DILATATION;  Surgeon: Abbie Sons, MD;  Location: [facility] ORS;  Service: Urology;  Laterality: N/A;  . EP IMPLANTABLE DEVICE N/A 02/24/2016   Procedure: PPM Generator  Changeout;  Surgeon: Deboraha Sprang, MD;  Location: [place] [facility];  Service: Cardiovascular;  Laterality: N/A;  . gastric ulcer surgery    . HERNIA REPAIR  1994, 2001, 2003   s/p drainage and complications, 10/6759, 11/5091 mesh removal  . INSERT / REPLACE / REMOVE PACEMAKER  07/2005   symptomatic bradycardia  . kidney stones    . NOSE SURGERY     cancer removed   . PROSTATE SURGERY    . SKIN CANCER DESTRUCTION    . TOOTH EXTRACTION     Family History  Problem  Relation Age of Onset  . Breast cancer Mother   . Bone cancer Father   . Alcohol abuse Other   . Coronary artery disease Other   . Dementia Other   . Diabetes Other    Social History   Socioeconomic History  . Marital status: Widowed    Spouse name: Not on file  . Number of children: Not on file  . Years of education: Not on file  . Highest education level: Not on file  Occupational History  . Not on file  Social Needs  . Financial resource strain: Not on file  . Food insecurity:    Worry: Not on file    Inability: Not on file  . Transportation needs:    Medical: Not on file    Non-medical: Not on file  Tobacco Use  . Smoking status: Never Smoker  . Smokeless tobacco: Never Used  Substance and Sexual Activity  . Alcohol use: No  . Drug use: No  . Sexual activity: Not on file  Lifestyle  . Physical activity:    Days per week: Not on file    Minutes per session: Not on file  . Stress: Not on file  Relationships  . Social connections:    Talks on phone: Not on file    Gets together: Not on file    Attends religious service: Not on file    Active member of club or organization: Not on file    Attends meetings of clubs or organizations: Not on file    Relationship status: Not on file  Other Topics Concern  . Not on file  Social History Narrative  . Not on file    Outpatient Encounter Medications as of 04/08/2018  Medication Sig  . acetaminophen (TYLENOL) 500 MG tablet Take 500 mg by mouth every 6 (six) hours as needed for moderate pain or headache.  . alendronate (FOSAMAX) 70 MG tablet TAKE 1 TABLET BY MOUTH EVERY 7 DAYS. TAKE WITH A FULL GLASS OF WATER ON AN EMPTY STOMACH  . amiodarone (PACERONE) 200 MG tablet TAKE 1/2 TABLET BY MOUTH DAILY  . clindamycin (CLEOCIN) 300 MG capsule Take 600 mg by mouth See admin instructions. Take 600 mg by mouth 1 hour prior to dental procedure  . Cyanocobalamin (VITAMIN B-12 IJ) Inject 1,000 mcg as directed every 30 (thirty) days.    Marland Kitchen docusate sodium (COLACE) 50 MG capsule Take 50 mg by mouth 2 (two) times daily as needed for mild constipation.  . dorzolamide-timolol (COSOPT) 22.3-6.8 MG/ML ophthalmic solution Place 1 drop into both eyes 2 (two) times daily.    . ferrous sulfate 325 (65 FE) MG EC tablet Take 325 mg by mouth daily with breakfast.    . furosemide (LASIX) 20 MG tablet Take 2 tablets (40 mg) by mouth once every other day as directed  . levothyroxine (SYNTHROID, LEVOTHROID) 100 MCG tablet TAKE 1 TABLET BY  MOUTH DAILY BEFORE BREAKFAST  . loperamide (IMODIUM) 2 MG capsule Take 2 mg by mouth as needed for diarrhea or loose stools.  . Multiple Vitamin (MULTIVITAMIN) tablet Take 1 tablet by mouth daily.    . pantoprazole (PROTONIX) 40 MG tablet TAKE 1 TABLET(40 MG) BY MOUTH DAILY  . polyethylene glycol (MIRALAX / GLYCOLAX) packet Take 17 g by mouth daily as needed.  . pravastatin (PRAVACHOL) 40 MG tablet TAKE ONE-HALF TABLET BY MOUTH DAILY  . protein supplement (RESOURCE BENEPROTEIN) POWD Take 1 scoop by mouth 3 (three) times daily with meals.  . simethicone (MYLICON) 956 MG chewable tablet Chew 125 mg by mouth every 6 (six) hours as needed for flatulence.  . tamsulosin (FLOMAX) 0.4 MG CAPS capsule TAKE 1 CAPSULE(0.4 MG) BY MOUTH DAILY  . warfarin (COUMADIN) 4 MG tablet Take as directed by coumadin clinic  . Wheat Dextrin (BENEFIBER DRINK MIX PO) Take 1 scoop by mouth daily.    No facility-administered encounter medications on file as of 04/08/2018.     Activities of Daily Living In your present state of health, do you have any difficulty performing the following activities: 04/08/2018  Hearing? Y  Vision? N  Difficulty concentrating or making decisions? N  Walking or climbing stairs? N  Dressing or bathing? N  Doing errands, shopping? N  Preparing Food and eating ? N  Using the Toilet? N  In the past six months, have you accidently leaked urine? Y  Do you have problems with loss of bowel control? Y    Managing your Medications? N  Managing your Finances? N  Housekeeping or managing your Housekeeping? N  Some recent data might be hidden    Patient Care Team: Owens Loffler, MD as PCP - General (Family Medicine)   Assessment:   This is a routine wellness examination for [place].  Hearing Screening Comments: Bilateral hearing aids Vision Screening Comments: Vision exam on 11/02/2017 [facility]  Exercise Activities and Dietary recommendations Current Exercise Habits: The patient does not participate in regular exercise at present, Exercise limited by: None identified  Goals    . DIET - INCREASE WATER INTAKE     Starting 04/08/2018, I will continue to drink at least 10 glasses of water daily.        Fall Risk Fall Risk  04/08/2018 04/06/2017 03/18/2016 01/24/2015 12/14/2013  Falls in the past year? 1 Yes No No No  Comment fell in bathroom due loss of balance; denies injury  tripped over rug - - -  Number falls in past yr: 0 1 - - -  Injury with Fall? 0 No - - -   Depression Screen PHQ 2/9 Scores 04/08/2018 04/06/2017 03/18/2016 01/24/2015  PHQ - 2 Score 0 0 0 0  PHQ- 9 Score 0 0 - -    Cognitive Function MMSE - Mini Mental State Exam 04/08/2018 04/06/2017  Orientation to time 5 5  Orientation to Place 5 5  Registration 3 3  Attention/ Calculation 0 0  Recall 3 3  Language- name 2 objects 0 0  Language- repeat 1 1  Language- follow 3 step command 3 3  Language- read & follow direction 0 0  Write a sentence 0 0  Copy design 0 0  Total score 20 20     PLEASE NOTE: A Mini-Cog screen was completed. Maximum score is 20. A value of 0 denotes this part of [place] MMSE was not completed or the patient failed this part of the Mini-Cog screening.  Mini-Cog Screening Orientation to Time - Max 5 pts Orientation to Place - Max 5 pts Registration - Max 3 pts Recall - Max 3 pts Language Repeat - Max 1 pts Language Follow 3 Step Command - Max 3 pts     Immunization  History  Administered Date(s) Administered  . H1N1 05/09/2008  . Influenza Split 02/10/2011  . Influenza Whole 01/15/2009, 01/13/2010  . Influenza, High Dose Seasonal PF 12/21/2012, 02/23/2017  . Influenza,inj,Quad PF,6+ Mos 01/08/2014, 01/25/2015, 01/06/2018  . Pneumococcal Conjugate-13 12/14/2013  . Pneumococcal Polysaccharide-23 03/31/2003, 03/30/2005  . Td 03/31/2003, 04/06/2017  . Zoster 02/06/2008    Screening Tests Health Maintenance  Topic Date Due  . TETANUS/TDAP  04/07/2027  . INFLUENZA VACCINE  Completed  . PNA vac Low Risk Adult  Completed      Plan:  I have personally reviewed, addressed, and noted the following in the patient's chart:  A. Medical and social history B. Use of alcohol, tobacco or illicit drugs  C. Current medications and supplements D. Functional ability and status E.  Nutritional status F.  Physical activity G. Advance directives H. List of other physicians I.  Hospitalizations, surgeries, and ER visits in previous 12 months J.  [place] to include hearing, vision, cognitive, depression L. Referrals and appointments - none  In addition, I have reviewed and discussed with patient certain preventive protocols, quality metrics, and best practice recommendations. A written personalized care plan for preventive services as well as general preventive health recommendations were provided to patient.  See attached scanned questionnaire for additional information.   Signed,   Lindell Noe, MHA, BS, LPN Health Coach

## 2018-04-08 NOTE — Patient Instructions (Signed)
[name] [name] , Thank you for taking time to come for your Medicare Wellness Visit. I appreciate your ongoing commitment to your health goals. Please review the following plan we discussed and let me know if I can assist you in the future.   These are the goals we discussed: Goals    . DIET - INCREASE WATER INTAKE     Starting 04/08/2018, I will continue to drink at least 10 glasses of water daily.        This is a list of the screening recommended for you and due dates:  Health Maintenance  Topic Date Due  . Tetanus Vaccine  04/07/2027  . Flu Shot  Completed  . Pneumonia vaccines  Completed   Preventive Care for Adults  A healthy lifestyle and preventive care can promote health and wellness. Preventive health guidelines for adults include the following key practices.  . A routine yearly physical is a good way to check with your health care provider about your health and preventive screening. It is a chance to share any concerns and updates on your health and to receive a thorough exam.  . Visit your dentist for a routine exam and preventive care every 6 months. Brush your teeth twice a day and floss once a day. Good oral hygiene prevents tooth decay and gum disease.  . The frequency of eye exams is based on your age, health, family medical history, use  of contact lenses, and other factors. Follow your health care provider's recommendations for frequency of eye exams.  . Eat a healthy diet. Foods like vegetables, fruits, whole grains, low-fat dairy products, and lean protein foods contain the nutrients you need without too many calories. Decrease your intake of foods high in solid fats, added sugars, and salt. Eat the right amount of calories for you. Get information about a proper diet from your health care provider, if necessary.  . Regular physical exercise is one of the most important things you can do for your health. Most adults should get at least 150 minutes of moderate-intensity  exercise (any activity that increases your heart rate and causes you to sweat) each week. In addition, most adults need muscle-strengthening exercises on 2 or more days a week.  Silver Sneakers may be a benefit available to you. To determine eligibility, you may visit the website: www.silversneakers.com or contact program at 617-669-9732 Mon-Fri between 8AM-8PM.   . Maintain a healthy weight. The body mass index (BMI) is a screening tool to identify possible weight problems. It provides an estimate of body fat based on height and weight. Your health care provider can find your BMI and can help you achieve or maintain a healthy weight.   For adults 20 years and older: ? A BMI below 18.5 is considered underweight. ? A BMI of 18.5 to 24.9 is normal. ? A BMI of 25 to 29.9 is considered overweight. ? A BMI of 30 and above is considered obese.   . Maintain normal blood lipids and cholesterol levels by exercising and minimizing your intake of saturated fat. Eat a balanced diet with plenty of fruit and vegetables. Blood tests for lipids and cholesterol should begin at age 72 and be repeated every 5 years. If your lipid or cholesterol levels are high, you are over 50, or you are at high risk for heart disease, you may need your cholesterol levels checked more frequently. Ongoing high lipid and cholesterol levels should be treated with medicines if diet and exercise are not  working.  . If you smoke, find out from your health care provider how to quit. If you do not use tobacco, please do not start.  . If you choose to drink alcohol, please do not consume more than 2 drinks per day. One drink is considered to be 12 ounces (355 mL) of beer, 5 ounces (148 mL) of wine, or 1.5 ounces (44 mL) of liquor.  . If you are 30-86 years old, ask your health care provider if you should take aspirin to prevent strokes.  . Use sunscreen. Apply sunscreen liberally and repeatedly throughout the day. You should seek shade  when your shadow is shorter than you. Protect yourself by wearing long sleeves, pants, a wide-brimmed hat, and sunglasses year round, whenever you are outdoors.  . Once a month, do a whole body skin exam, using a mirror to look at the skin on your back. Tell your health care provider of new moles, moles that have irregular borders, moles that are larger than a pencil eraser, or moles that have changed in shape or color.

## 2018-04-08 NOTE — Progress Notes (Signed)
PCP notes:   Health maintenance:  No gaps identified.  Abnormal screenings:   Fall risk - hx of single fall Fall Risk  04/08/2018 04/06/2017 03/18/2016 01/24/2015 12/14/2013  Falls in the past year? 1 Yes No No No  Comment fell in bathroom due loss of balance; denies injury  tripped over rug - - -  Number falls in past yr: 0 1 - - -  Injury with Fall? 0 No - - -    Patient concerns:   None  Nurse concerns:  None  Next PCP appt:   04/18/18 @ 1000

## 2018-04-10 ENCOUNTER — Other Ambulatory Visit: Payer: Self-pay | Admitting: Family Medicine

## 2018-04-10 ENCOUNTER — Other Ambulatory Visit: Payer: Self-pay | Admitting: Urology

## 2018-04-12 ENCOUNTER — Ambulatory Visit: Payer: Medicare Other

## 2018-04-12 ENCOUNTER — Telehealth: Payer: Self-pay | Admitting: Family Medicine

## 2018-04-12 MED ORDER — FUROSEMIDE 20 MG PO TABS: ORAL_TABLET | ORAL | 1 refills | Status: DC

## 2018-04-12 NOTE — Telephone Encounter (Signed)
Pt called office stating he has 1 refill left on the furosemide for a 30 day supply. Pt says that he usually gets a 90 day supply because it is cheaper. Pt is requesting to change it to a 90 day supply.

## 2018-04-12 NOTE — Addendum Note (Signed)
Addended by: Carter Kitten on: 04/12/2018 02:28 PM   Modules accepted: Orders

## 2018-04-12 NOTE — Telephone Encounter (Signed)
Refill sent as requested. 

## 2018-04-17 ENCOUNTER — Encounter: Payer: Self-pay | Admitting: Family Medicine

## 2018-04-17 NOTE — Progress Notes (Signed)
Dr. Frederico Hamman T. Rishith Siddoway, MD, [facility] [place], [address] Phone: [phone] Fax: [phone]  04/18/2018  Patient: [name] [name], MRN: [id number], DOB: 06/11/1922, 85 y.o.  Primary Physician:  Owens Loffler, MD   Chief Complaint  Patient presents with  . Annual Exam    Part 2   Subjective:   [name] [name] is a 85 y.o. very pleasant male patient who presents with the following:  85 yo male here for f/u - had medicare wellness 04/08/2018 by [name] [name].  He has a history of AF, pacemaker, HTN, HLD, hypothyroidism, h/o prostate cancer in remission, CKD 3, chronic coumadin use, OA, b12 long-term deficiency.  HTN: Tolerating all medications without side effects Stable and at goal No CP, no sob. No HA.  BP Readings from Last 3 Encounters:  04/18/18 110/68  04/08/18 110/78  01/25/18 (!) 810/17    Basic Metabolic Panel:    Component Value Date/Time   NA 143 04/08/2018 0916   K 3.3 (L) 04/08/2018 0916   CL 106 04/08/2018 0916   CO2 28 04/08/2018 0916   BUN 32 (H) 04/08/2018 0916   CREATININE 2.01 (H) 04/08/2018 0916   GLUCOSE 117 (H) 04/08/2018 0916   CALCIUM 9.1 04/08/2018 0916    Thyroid: No symptoms. Labs reviewed. Denies cold / heat intolerance, dry skin, hair loss. No goiter.  Lab Results  Component Value Date   TSH 0.40 04/08/2018    Lipids: Doing well, stable. Tolerating meds fine with no SE. Panel reviewed with patient.  Lipids:    Component Value Date/Time   CHOL 132 04/08/2018 0916   TRIG 50.0 04/08/2018 0916   HDL 68.50 04/08/2018 0916   VLDL 10.0 04/08/2018 0916   CHOLHDL 2 04/08/2018 0916    Lab Results  Component Value Date   ALT 21 04/08/2018   AST 25 04/08/2018   ALKPHOS 96 04/08/2018   BILITOT 0.8 04/08/2018     Past Medical History, Surgical History, Social History, Family History, Problem List, Medications, and Allergies have been reviewed and updated if  relevant.  Patient Active Problem List   Diagnosis Date Noted  . PACEMAKER, PERMANENT 08/07/2008    Priority: Medium  . PAROXYSMAL ATRIAL FIBRILLATION 02/06/2008    Priority: Medium  . CKD (chronic kidney disease) stage 3, GFR 30-59 ml/min ([name]) 04/13/2017  . DDD (degenerative disc disease), cervical, Severe 06/09/2013  . Neck arthritis, Severe, multi-level 06/09/2013  . Sinoatrial node dysfunction ([name])   . Long term (current) use of anticoagulants 06/24/2010  . IRON DEFICIENCY 04/17/2010  . GLAUCOMA 08/07/2008  . GERD 08/07/2008  . Vitamin B 12 deficiency 07/19/2008  . PEPTIC ULCER DISEASE 07/18/2008  . RADIATION PROCTITIS 07/18/2008  . DIVERTICULOSIS, COLON 07/18/2008  . Prostate cancer, in remission 07/18/2008  . Essential hypertension 05/09/2008  . Hyperlipidemia 02/06/2008  . Hypothyroidism 12/01/2007  . HERN UNS SITE ABD CAV W/O MENTION OBST/GANGREN 12/01/2007    Past Medical History:  Diagnosis Date  . Cancer ([place])   . Cardiac pacemaker in situ 07/2005   symptomatic bradycardia  . CKD (chronic kidney disease) stage 3, GFR 30-59 ml/min ([name]) 04/13/2017  . Diverticulosis of colon (without mention of hemorrhage)   . 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 GERD (gastroesophageal reflux disease)   . GI hemorrhage   . Hyperlipidemia   . Hypertension   . Lower extremity  edema   . Neck arthritis, Severe, multi-level 06/09/2013  . Paroxysmal atrial fibrillation ([name])    s/p failed ablation  . Peptic ulcer, unspecified site, unspecified as acute or chronic, without mention of hemorrhage, perforation, or obstruction 1975  . Personal history of malignant neoplasm of prostate 12/2000   5 weeks radiation, radiation seeds  . Radiation proctitis   . Sinoatrial node dysfunction ([name])   . Unspecified glaucoma(365.9)   . Unspecified hypothyroidism   . Vitamin B12 deficiency     Past Surgical History:    Procedure Laterality Date  . CARDIAC CATHETERIZATION    . CARDIAC ELECTROPHYSIOLOGY [place] AND ABLATION  2001, 2003   failure  . CATARACT EXTRACTION    . CYSTOSCOPY WITH URETHRAL DILATATION N/A 01/29/2017   Procedure: CYSTOSCOPY WITH URETHRAL DILATATION;  Surgeon: Abbie Sons, MD;  Location: [facility] ORS;  Service: Urology;  Laterality: N/A;  . EP IMPLANTABLE DEVICE N/A 02/24/2016   Procedure: PPM Generator Changeout;  Surgeon: Deboraha Sprang, MD;  Location: [facility];  Service: Cardiovascular;  Laterality: N/A;  . gastric ulcer surgery    . HERNIA REPAIR  1994, 2001, 2003   s/p drainage and complications, 03/7406, 03/4479 mesh removal  . INSERT / REPLACE / REMOVE PACEMAKER  07/2005   symptomatic bradycardia  . kidney stones    . NOSE SURGERY     cancer removed   . PROSTATE SURGERY    . SKIN CANCER DESTRUCTION    . TOOTH EXTRACTION      Social History   Socioeconomic History  . Marital status: Widowed    Spouse name: Not on file  . Number of children: Not on file  . Years of education: Not on file  . Highest education level: Not on file  Occupational History  . Not on file  Social Needs  . Financial resource strain: Not on file  . Food insecurity:    Worry: Not on file    Inability: Not on file  . Transportation needs:    Medical: Not on file    Non-medical: Not on file  Tobacco Use  . Smoking status: Never Smoker  . Smokeless tobacco: Never Used  Substance and Sexual Activity  . Alcohol use: No  . Drug use: No  . Sexual activity: Not on file  Lifestyle  . Physical activity:    Days per week: Not on file    Minutes per session: Not on file  . Stress: Not on file  Relationships  . Social connections:    Talks on phone: Not on file    Gets together: Not on file    Attends religious service: Not on file    Active member of club or organization: Not on file    Attends meetings of clubs or organizations: Not on file    Relationship status: Not on file   . Intimate partner violence:    Fear of current or ex partner: Not on file    Emotionally abused: Not on file    Physically abused: Not on file    Forced sexual activity: Not on file  Other Topics Concern  . Not on file  Social History Narrative  . Not on file    Family History  Problem Relation Age of Onset  . Breast cancer Mother   . Bone cancer Father   . Alcohol abuse Other   . Coronary artery disease Other   . Dementia Other   . Diabetes Other  Allergies  Allergen Reactions  . Penicillins Rash and Other (See Comments)    Has patient had a PCN reaction causing immediate rash, facial/tongue/throat swelling, SOB or lightheadedness with hypotension: {no Has patient had a PCN reaction causing severe rash involving mucus membranes or skin necrosis: {no Has patient had a PCN reaction that required hospitalization {no Has patient had a PCN reaction occurring within the last 10 years: {no If all of the above answers are "NO", then may proceed with Cephalosporin use.    Medication list reviewed and updated in full in [place].   GEN: No acute illnesses, no fevers, chills. GI: No n/v/d, eating normally Pulm: No SOB Interactive and getting along well at home.  Otherwise, ROS is as per the HPI.  Objective:   BP 110/68   Pulse 88   Temp 97.6 F (36.4 C) (Oral)   Ht 5' 9.5" (1.765 m)   Wt 136 lb 12 oz (62 kg)   BMI 19.90 kg/m   GEN: WDWN, NAD, Non-toxic, A & O x 3 HEENT: Atraumatic, Normocephalic. Neck supple. No masses, No LAD. Ears and Nose: No external deformity. CV: RRR, No M/G/R. No JVD. No thrill. No extra heart sounds. PULM: CTA B, no wheezes, crackles, rhonchi. No retractions. No resp. distress. No accessory muscle use. EXTR: No c/c/e NEURO Normal gait.  PSYCH: Normally interactive. Conversant. Not depressed or anxious appearing.  Calm demeanor.   Laboratory and Imaging Data: Results for orders placed or performed in visit on 04/08/18  TSH   Result Value Ref Range   TSH 0.40 0.35 - 4.50 uIU/mL  Basic Metabolic Panel  Result Value Ref Range   Sodium 143 135 - 145 mEq/L   Potassium 3.3 (L) 3.5 - 5.1 mEq/L   Chloride 106 96 - 112 mEq/L   CO2 28 19 - 32 mEq/L   Glucose, Bld 117 (H) 70 - 99 mg/dL   BUN 32 (H) 6 - 23 mg/dL   Creatinine, Ser 2.01 (H) 0.40 - 1.50 mg/dL   Calcium 9.1 8.4 - 10.5 mg/dL   GFR 33.11 (L) >60.00 mL/min  Hepatic Function Panel  Result Value Ref Range   Total Bilirubin 0.8 0.2 - 1.2 mg/dL   Bilirubin, Direct 0.2 0.0 - 0.3 mg/dL   Alkaline Phosphatase 96 39 - 117 U/L   AST 25 0 - 37 U/L   ALT 21 0 - 53 U/L   Total Protein 6.4 6.0 - 8.3 g/dL   Albumin 3.6 3.5 - 5.2 g/dL  CBC with Differential/Platelet  Result Value Ref Range   WBC 5.4 4.0 - 10.5 K/uL   RBC 3.34 (L) 4.22 - 5.81 Mil/uL   Hemoglobin 11.0 (L) 13.0 - 17.0 g/dL   HCT 33.4 (L) 39.0 - 52.0 %   MCV 100.1 (H) 78.0 - 100.0 fl   MCHC 32.9 30.0 - 36.0 g/dL   RDW 13.8 11.5 - 15.5 %   Platelets 196.0 150.0 - 400.0 K/uL   Neutrophils Relative % 58.0 43.0 - 77.0 %   Lymphocytes Relative 27.5 12.0 - 46.0 %   Monocytes Relative 9.9 3.0 - 12.0 %   Eosinophils Relative 3.7 0.0 - 5.0 %   Basophils Relative 0.9 0.0 - 3.0 %   Neutro Abs 3.1 1.4 - 7.7 K/uL   Lymphs Abs 1.5 0.7 - 4.0 K/uL   Monocytes Absolute 0.5 0.1 - 1.0 K/uL   Eosinophils Absolute 0.2 0.0 - 0.7 K/uL   Basophils Absolute 0.0 0.0 - 0.1 K/uL  T3,  Free  Result Value Ref Range   T3, Free 2.1 (L) 2.3 - 4.2 pg/mL  T4, Free  Result Value Ref Range   Free T4 1.56 0.60 - 1.60 ng/dL  Vitamin D, 25-hydroxy  Result Value Ref Range   VITD 55.51 30.00 - 100.00 ng/mL  Lipid Panel  Result Value Ref Range   Cholesterol 132 0 - 200 mg/dL   Triglycerides 50.0 0.0 - 149.0 mg/dL   HDL 68.50 >39.00 mg/dL   VLDL 10.0 0.0 - 40.0 mg/dL   LDL Cholesterol 53 0 - 99 mg/dL   Total CHOL/HDL Ratio 2    NonHDL 63.46      Assessment and Plan:   Essential hypertension  Mixed  hyperlipidemia  Prostate cancer, in remission  Other specified hypothyroidism  CKD (chronic kidney disease) stage 3, GFR 30-59 ml/min ([name])  Paroxysmal atrial fibrillation ([name])  Vitamin B 12 deficiency - Plan: cyanocobalamin ((VITAMIN B-12)) injection 1,000 mcg  Is really doing well in general.  His albumin is increased and he is eating a little bit better and his weight is gone up some.  Rest of his chronic conditions are stable, and he is tolerating all of his heart medication without problems.  TSH and free T4 stable.  Follow-up: Return in about 6 months (around 10/17/2018).  Meds ordered this encounter  Medications  . cyanocobalamin ((VITAMIN B-12)) injection 1,000 mcg   Signed,  Mayara Paulson T. Cherish Runde, MD   Outpatient Encounter Medications as of 04/18/2018  Medication Sig  . acetaminophen (TYLENOL) 500 MG tablet Take 500 mg by mouth every 6 (six) hours as needed for moderate pain or headache.  . alendronate (FOSAMAX) 70 MG tablet TAKE 1 TABLET BY MOUTH EVERY 7 DAYS. TAKE WITH A FULL GLASS OF WATER ON AN EMPTY STOMACH  . amiodarone (PACERONE) 200 MG tablet TAKE 1/2 TABLET BY MOUTH DAILY  . clindamycin (CLEOCIN) 300 MG capsule Take 600 mg by mouth See admin instructions. Take 600 mg by mouth 1 hour prior to dental procedure  . Cyanocobalamin (VITAMIN B-12 IJ) Inject 1,000 mcg as directed every 30 (thirty) days.   Marland Kitchen docusate sodium (COLACE) 50 MG capsule Take 50 mg by mouth 2 (two) times daily as needed for mild constipation.  . dorzolamide-timolol (COSOPT) 22.3-6.8 MG/ML ophthalmic solution Place 1 drop into both eyes 2 (two) times daily.    . ferrous sulfate 325 (65 FE) MG EC tablet Take 325 mg by mouth daily with breakfast.    . furosemide (LASIX) 20 MG tablet Take 2 tablets (40 mg) by mouth once every other day as directed  . levothyroxine (SYNTHROID, LEVOTHROID) 100 MCG tablet TAKE 1 TABLET BY MOUTH DAILY BEFORE BREAKFAST  . loperamide (IMODIUM) 2 MG capsule Take 2 mg by  mouth as needed for diarrhea or loose stools.  . Multiple Vitamin (MULTIVITAMIN) tablet Take 1 tablet by mouth daily.    . pantoprazole (PROTONIX) 40 MG tablet TAKE 1 TABLET(40 MG) BY MOUTH DAILY  . polyethylene glycol (MIRALAX / GLYCOLAX) packet Take 17 g by mouth daily as needed.  . pravastatin (PRAVACHOL) 40 MG tablet TAKE ONE-HALF TABLET BY MOUTH DAILY  . protein supplement (RESOURCE BENEPROTEIN) POWD Take 1 scoop by mouth 3 (three) times daily with meals.  . simethicone (MYLICON) 960 MG chewable tablet Chew 125 mg by mouth every 6 (six) hours as needed for flatulence.  . tamsulosin (FLOMAX) 0.4 MG CAPS capsule TAKE 1 CAPSULE(0.4 MG) BY MOUTH DAILY  . warfarin (COUMADIN) 4 MG tablet Take as directed  by coumadin clinic  . Wheat Dextrin (BENEFIBER DRINK MIX PO) Take 1 scoop by mouth daily.   . [EXPIRED] cyanocobalamin ((VITAMIN B-12)) injection 1,000 mcg    No facility-administered encounter medications on file as of 04/18/2018.

## 2018-04-18 ENCOUNTER — Encounter: Payer: Medicare Other | Admitting: Family Medicine

## 2018-04-18 ENCOUNTER — Encounter: Payer: Self-pay | Admitting: Family Medicine

## 2018-04-18 ENCOUNTER — Ambulatory Visit (INDEPENDENT_AMBULATORY_CARE_PROVIDER_SITE_OTHER): Payer: Medicare Other | Admitting: Family Medicine

## 2018-04-18 VITALS — BP 110/68 | HR 88 | Temp 97.6°F | Ht 69.5 in | Wt 136.8 lb

## 2018-04-18 DIAGNOSIS — N183 Chronic kidney disease, stage 3 unspecified: Secondary | ICD-10-CM

## 2018-04-18 DIAGNOSIS — C61 Malignant neoplasm of prostate: Secondary | ICD-10-CM | POA: Diagnosis not present

## 2018-04-18 DIAGNOSIS — E538 Deficiency of other specified B group vitamins: Secondary | ICD-10-CM

## 2018-04-18 DIAGNOSIS — E038 Other specified hypothyroidism: Secondary | ICD-10-CM | POA: Diagnosis not present

## 2018-04-18 DIAGNOSIS — I10 Essential (primary) hypertension: Secondary | ICD-10-CM

## 2018-04-18 DIAGNOSIS — I480 Paroxysmal atrial fibrillation: Secondary | ICD-10-CM

## 2018-04-18 DIAGNOSIS — E782 Mixed hyperlipidemia: Secondary | ICD-10-CM | POA: Diagnosis not present

## 2018-04-18 MED ORDER — CYANOCOBALAMIN 1000 MCG/ML IJ SOLN
1000.0000 ug | Freq: Once | INTRAMUSCULAR | Status: AC
  Administered 2018-04-18: 1000 ug via INTRAMUSCULAR

## 2018-04-25 ENCOUNTER — Ambulatory Visit (INDEPENDENT_AMBULATORY_CARE_PROVIDER_SITE_OTHER): Payer: Medicare Other

## 2018-04-25 DIAGNOSIS — I495 Sick sinus syndrome: Secondary | ICD-10-CM | POA: Diagnosis not present

## 2018-04-26 NOTE — Progress Notes (Signed)
Remote pacemaker transmission.   

## 2018-04-28 LAB — CUP PACEART REMOTE DEVICE CHECK
Battery Voltage: 3.02 V
Brady Statistic AP VP Percent: 6.5 %
Brady Statistic AP VS Percent: 82.17 %
Brady Statistic AS VP Percent: 1.76 %
Brady Statistic AS VS Percent: 9.58 %
Brady Statistic RA Percent Paced: 87.4 %
Brady Statistic RV Percent Paced: 8.5 %
Date Time Interrogation Session: 20200127153427
Implantable Lead Implant Date: 20070525
Implantable Lead Location: 753859
Implantable Lead Location: 753860
Implantable Lead Model: 5076
Implantable Lead Model: 5076
Implantable Pulse Generator Implant Date: 20171127
Lead Channel Impedance Value: 342 Ohm
Lead Channel Impedance Value: 361 Ohm
Lead Channel Impedance Value: 380 Ohm
Lead Channel Impedance Value: 418 Ohm
Lead Channel Pacing Threshold Amplitude: 0.75 V
Lead Channel Pacing Threshold Amplitude: 0.75 V
Lead Channel Pacing Threshold Pulse Width: 0.4 ms
Lead Channel Pacing Threshold Pulse Width: 0.4 ms
Lead Channel Sensing Intrinsic Amplitude: 1.5 mV
Lead Channel Sensing Intrinsic Amplitude: 1.5 mV
Lead Channel Sensing Intrinsic Amplitude: 2 mV
Lead Channel Setting Pacing Amplitude: 2 V
Lead Channel Setting Pacing Amplitude: 2.5 V
Lead Channel Setting Pacing Pulse Width: 0.4 ms
Lead Channel Setting Sensing Sensitivity: 0.6 mV
MDC IDC LEAD IMPLANT DT: 20070525
MDC IDC MSMT BATTERY REMAINING LONGEVITY: 97 mo
MDC IDC MSMT LEADCHNL RA SENSING INTR AMPL: 2 mV

## 2018-05-02 DIAGNOSIS — H401132 Primary open-angle glaucoma, bilateral, moderate stage: Secondary | ICD-10-CM | POA: Diagnosis not present

## 2018-05-11 DIAGNOSIS — H401132 Primary open-angle glaucoma, bilateral, moderate stage: Secondary | ICD-10-CM | POA: Diagnosis not present

## 2018-05-16 ENCOUNTER — Ambulatory Visit (INDEPENDENT_AMBULATORY_CARE_PROVIDER_SITE_OTHER): Payer: Medicare Other

## 2018-05-16 DIAGNOSIS — I480 Paroxysmal atrial fibrillation: Secondary | ICD-10-CM | POA: Diagnosis not present

## 2018-05-16 DIAGNOSIS — Z5181 Encounter for therapeutic drug level monitoring: Secondary | ICD-10-CM | POA: Diagnosis not present

## 2018-05-16 LAB — POCT INR: INR: 1.5 — AB (ref 2.0–3.0)

## 2018-05-16 NOTE — Patient Instructions (Signed)
Please take 3 tablets tonight, 2 tablets tomorrow, then continue dosage of 1mg  daily except 2mg s on Mondays and Saturdays.  Please be consistent with your greens intake.   Recheck in 6 weeks.

## 2018-05-24 ENCOUNTER — Ambulatory Visit (INDEPENDENT_AMBULATORY_CARE_PROVIDER_SITE_OTHER): Payer: Medicare Other | Admitting: *Deleted

## 2018-05-24 DIAGNOSIS — E538 Deficiency of other specified B group vitamins: Secondary | ICD-10-CM

## 2018-05-24 MED ORDER — CYANOCOBALAMIN 1000 MCG/ML IJ SOLN
1000.0000 ug | Freq: Once | INTRAMUSCULAR | Status: AC
  Administered 2018-05-24: 1000 ug via INTRAMUSCULAR

## 2018-05-24 NOTE — Progress Notes (Signed)
Per orders of Dr. Diona Browner injection of Vitamin B 12 given by Emelia Salisbury. Patient tolerated injection well.

## 2018-06-08 ENCOUNTER — Other Ambulatory Visit: Payer: Self-pay | Admitting: Internal Medicine

## 2018-06-08 ENCOUNTER — Other Ambulatory Visit: Payer: Self-pay | Admitting: Family Medicine

## 2018-06-27 ENCOUNTER — Other Ambulatory Visit: Payer: Self-pay

## 2018-06-27 ENCOUNTER — Ambulatory Visit (INDEPENDENT_AMBULATORY_CARE_PROVIDER_SITE_OTHER): Payer: Medicare Other

## 2018-06-27 DIAGNOSIS — I480 Paroxysmal atrial fibrillation: Secondary | ICD-10-CM

## 2018-06-27 LAB — POCT INR: INR: 1.4 — AB (ref 2.0–3.0)

## 2018-06-27 MED ORDER — WARFARIN SODIUM 1 MG PO TABS: 1.5000 mg | ORAL_TABLET | Freq: Every day | ORAL | 1 refills | Status: DC

## 2018-06-28 ENCOUNTER — Ambulatory Visit (INDEPENDENT_AMBULATORY_CARE_PROVIDER_SITE_OTHER): Payer: Medicare Other | Admitting: *Deleted

## 2018-06-28 ENCOUNTER — Other Ambulatory Visit: Payer: Self-pay

## 2018-06-28 DIAGNOSIS — E538 Deficiency of other specified B group vitamins: Secondary | ICD-10-CM

## 2018-06-28 MED ORDER — CYANOCOBALAMIN 1000 MCG/ML IJ SOLN
1000.0000 ug | Freq: Once | INTRAMUSCULAR | Status: AC
  Administered 2018-06-28: 1000 ug via INTRAMUSCULAR

## 2018-06-28 NOTE — Progress Notes (Signed)
Per orders of Dr. Diona Browner, injection of B12 given by Tammi Sou. Patient tolerated injection well.

## 2018-07-15 ENCOUNTER — Telehealth: Payer: Self-pay

## 2018-07-15 NOTE — Telephone Encounter (Signed)
Attempted to contact pt to prescreen prior to INR check on Monday. Left message for pt that I will be providing drive thru @ the [employer] and provided details. Asked him to call back w/ any questions or if he needs to r/s.

## 2018-07-18 ENCOUNTER — Other Ambulatory Visit: Payer: Self-pay

## 2018-07-18 ENCOUNTER — Ambulatory Visit (INDEPENDENT_AMBULATORY_CARE_PROVIDER_SITE_OTHER): Payer: Medicare Other

## 2018-07-18 DIAGNOSIS — Z5181 Encounter for therapeutic drug level monitoring: Secondary | ICD-10-CM

## 2018-07-18 DIAGNOSIS — I480 Paroxysmal atrial fibrillation: Secondary | ICD-10-CM | POA: Diagnosis not present

## 2018-07-18 LAB — POCT INR: INR: 2.9 (ref 2.0–3.0)

## 2018-07-18 NOTE — Patient Instructions (Signed)
Please continue dosage of 1.5 tablets EVERY DAY. Recheck in 4 weeks.

## 2018-07-20 ENCOUNTER — Other Ambulatory Visit: Payer: Self-pay | Admitting: Internal Medicine

## 2018-07-25 ENCOUNTER — Ambulatory Visit (INDEPENDENT_AMBULATORY_CARE_PROVIDER_SITE_OTHER): Payer: Medicare Other | Admitting: *Deleted

## 2018-07-25 ENCOUNTER — Other Ambulatory Visit: Payer: Self-pay

## 2018-07-25 DIAGNOSIS — I495 Sick sinus syndrome: Secondary | ICD-10-CM

## 2018-07-25 DIAGNOSIS — I480 Paroxysmal atrial fibrillation: Secondary | ICD-10-CM | POA: Diagnosis not present

## 2018-07-25 LAB — CUP PACEART REMOTE DEVICE CHECK
Battery Remaining Longevity: 95 mo
Battery Voltage: 3.02 V
Brady Statistic AP VP Percent: 0.98 %
Brady Statistic AP VS Percent: 87.42 %
Brady Statistic AS VP Percent: 2.28 %
Brady Statistic AS VS Percent: 9.31 %
Brady Statistic RA Percent Paced: 86.3 %
Brady Statistic RV Percent Paced: 3.4 %
Date Time Interrogation Session: 20200427132440
Implantable Lead Implant Date: 20070525
Implantable Lead Implant Date: 20070525
Implantable Lead Location: 753859
Implantable Lead Location: 753860
Implantable Lead Model: 5076
Implantable Lead Model: 5076
Implantable Pulse Generator Implant Date: 20171127
Lead Channel Impedance Value: 361 Ohm
Lead Channel Impedance Value: 361 Ohm
Lead Channel Impedance Value: 380 Ohm
Lead Channel Impedance Value: 437 Ohm
Lead Channel Pacing Threshold Amplitude: 0.75 V
Lead Channel Pacing Threshold Amplitude: 0.875 V
Lead Channel Pacing Threshold Pulse Width: 0.4 ms
Lead Channel Pacing Threshold Pulse Width: 0.4 ms
Lead Channel Sensing Intrinsic Amplitude: 2.875 mV
Lead Channel Sensing Intrinsic Amplitude: 2.875 mV
Lead Channel Sensing Intrinsic Amplitude: 3.125 mV
Lead Channel Sensing Intrinsic Amplitude: 3.125 mV
Lead Channel Setting Pacing Amplitude: 2 V
Lead Channel Setting Pacing Amplitude: 2.5 V
Lead Channel Setting Pacing Pulse Width: 0.4 ms
Lead Channel Setting Sensing Sensitivity: 0.6 mV

## 2018-08-02 NOTE — Progress Notes (Signed)
Remote pacemaker transmission.   

## 2018-08-04 ENCOUNTER — Ambulatory Visit (INDEPENDENT_AMBULATORY_CARE_PROVIDER_SITE_OTHER): Payer: Medicare Other

## 2018-08-04 DIAGNOSIS — E538 Deficiency of other specified B group vitamins: Secondary | ICD-10-CM | POA: Diagnosis not present

## 2018-08-04 MED ORDER — CYANOCOBALAMIN 1000 MCG/ML IJ SOLN
1000.0000 ug | Freq: Once | INTRAMUSCULAR | Status: AC
  Administered 2018-08-04: 16:00:00 1000 ug via INTRAMUSCULAR

## 2018-08-04 NOTE — Progress Notes (Signed)
Per orders of Dr. Lorelei Pont, injection of monthly B12 given by Kris Mouton. Patient tolerated injection well.

## 2018-08-12 ENCOUNTER — Telehealth: Payer: Self-pay

## 2018-08-12 NOTE — Telephone Encounter (Signed)
Attempted to contact pt to prescreen for COVID19 prior to INR check on Monday, May 18. Left message on pt's vm reminding hm of appt in drive thru @ [place] @ 9:30; asked him to call back if he will be unable to keep this appt, if he has developed a fever, travelled out of state since his last visit, or has been in contact w/ anyone w/ the virus.

## 2018-08-15 ENCOUNTER — Ambulatory Visit (INDEPENDENT_AMBULATORY_CARE_PROVIDER_SITE_OTHER): Payer: Medicare Other

## 2018-08-15 ENCOUNTER — Other Ambulatory Visit: Payer: Self-pay

## 2018-08-15 DIAGNOSIS — I480 Paroxysmal atrial fibrillation: Secondary | ICD-10-CM | POA: Diagnosis not present

## 2018-08-15 DIAGNOSIS — Z5181 Encounter for therapeutic drug level monitoring: Secondary | ICD-10-CM | POA: Diagnosis not present

## 2018-08-15 LAB — POCT INR: INR: 2.3 (ref 2.0–3.0)

## 2018-08-15 NOTE — Patient Instructions (Signed)
Please continue dosage of 1.5 tablets EVERY DAY. Recheck in 5 weeks.

## 2018-08-16 ENCOUNTER — Telehealth: Payer: Self-pay | Admitting: Internal Medicine

## 2018-08-16 NOTE — Telephone Encounter (Signed)
°*  STAT* If patient is at the pharmacy, call can be transferred to refill team.   1. Which medications need to be refilled? (please list name of each medication and dose if known) levothyroxine 100 MG 1 tablet daily    2. Which pharmacy/location (including street and city if local pharmacy) is medication to be sent to? [facility] on [address]   3. Do they need a 30 day or 90 day supply? 90 day

## 2018-08-17 MED ORDER — LEVOTHYROXINE SODIUM 100 MCG PO TABS: ORAL_TABLET | ORAL | 3 refills | Status: DC

## 2018-08-17 NOTE — Telephone Encounter (Signed)
Please review for refill, Thanks !  

## 2018-08-17 NOTE — Telephone Encounter (Signed)
Please advise if Dr. Caryl Comes would like to refill this medication.   Thanks!

## 2018-08-17 NOTE — Telephone Encounter (Signed)
Per 01/25/18 office note from Dr. Caryl Comes:  "Treated hypothyroidism with a low TSH.  Will ask Dr. Edilia Bo to consider down titration of thyroid replacement"  This should be sent to his PCP to refill.  Thanks!

## 2018-08-17 NOTE — Telephone Encounter (Signed)
Refills sent as instructed by Dr. Lorelei Pont.

## 2018-08-17 NOTE — Telephone Encounter (Signed)
TSH and Free T4 normal  Can you refill, same dose  #90, 3 ref

## 2018-09-01 ENCOUNTER — Other Ambulatory Visit: Payer: Self-pay

## 2018-09-01 ENCOUNTER — Encounter: Payer: Self-pay | Admitting: Family Medicine

## 2018-09-01 ENCOUNTER — Ambulatory Visit (INDEPENDENT_AMBULATORY_CARE_PROVIDER_SITE_OTHER): Payer: Medicare Other | Admitting: Family Medicine

## 2018-09-01 VITALS — BP 151/76 | HR 89 | Ht 69.5 in | Wt 131.4 lb

## 2018-09-01 DIAGNOSIS — R300 Dysuria: Secondary | ICD-10-CM

## 2018-09-01 LAB — URINALYSIS, COMPLETE
Bilirubin, UA: NEGATIVE
Glucose, UA: NEGATIVE
Ketones, UA: NEGATIVE
Nitrite, UA: POSITIVE — AB
Specific Gravity, UA: 1.015 (ref 1.005–1.030)
Urobilinogen, Ur: 0.2 mg/dL (ref 0.2–1.0)
pH, UA: 5.5 (ref 5.0–7.5)

## 2018-09-01 LAB — MICROSCOPIC EXAMINATION: WBC, UA: >30 /hpf — AB (ref 0–5)

## 2018-09-01 LAB — BLADDER SCAN AMB NON-IMAGING: Scan Result: 15

## 2018-09-01 MED ORDER — NITROFURANTOIN MONOHYD MACRO 100 MG PO CAPS: 100.0000 mg | ORAL_CAPSULE | Freq: Two times a day (BID) | ORAL | 0 refills | Status: DC

## 2018-09-01 NOTE — Progress Notes (Signed)
Patient presents today with complaints of odor in urine and urinary incontinence. Patient states his symptoms have gradually gotten worse in the last 3 weeks. A urine was collected in office today for UA, UCX. A PVR was performed with a residual of 15ML. Larene Beach reviewed the ua in office. Nitrofurantoin was sent to pharmacy.

## 2018-09-04 ENCOUNTER — Other Ambulatory Visit: Payer: Self-pay | Admitting: Internal Medicine

## 2018-09-04 LAB — CULTURE, URINE COMPREHENSIVE

## 2018-09-06 ENCOUNTER — Other Ambulatory Visit: Payer: Self-pay

## 2018-09-06 ENCOUNTER — Telehealth: Payer: Self-pay | Admitting: Urology

## 2018-09-06 ENCOUNTER — Telehealth: Payer: Self-pay | Admitting: Internal Medicine

## 2018-09-06 ENCOUNTER — Ambulatory Visit (INDEPENDENT_AMBULATORY_CARE_PROVIDER_SITE_OTHER): Payer: Medicare Other | Admitting: Urology

## 2018-09-06 ENCOUNTER — Encounter: Payer: Self-pay | Admitting: Urology

## 2018-09-06 VITALS — BP 100/67 | HR 82 | Ht 69.0 in | Wt 131.0 lb

## 2018-09-06 DIAGNOSIS — N35912 Unspecified bulbous urethral stricture, male: Secondary | ICD-10-CM

## 2018-09-06 LAB — MICROSCOPIC EXAMINATION

## 2018-09-06 LAB — URINALYSIS, COMPLETE
Bilirubin, UA: NEGATIVE
Glucose, UA: NEGATIVE
Ketones, UA: NEGATIVE
Nitrite, UA: NEGATIVE
Protein,UA: NEGATIVE
Specific Gravity, UA: 1.01 (ref 1.005–1.030)
Urobilinogen, Ur: 0.2 mg/dL (ref 0.2–1.0)
pH, UA: 6 (ref 5.0–7.5)

## 2018-09-06 NOTE — Progress Notes (Signed)
   09/06/18  CC:  Chief Complaint  Patient presents with  . Cysto    HPI: 85 year old male with a history of urethral stricture disease.  He presented for a nurse visit last week complaining of a 3-week history of worsening urinary incontinence and increased odor to the urine.  PVR was 15 mL.  He also had been draining clear fluid from an area of an previous herniorrhaphy incision.  Blood pressure 100/67, pulse 82, height 5\' 9"  (1.753 m), weight 131 lb (59.4 kg). NED. A&Ox3.   No respiratory distress   Abd soft, NT, ND Normal phallus with bilateral descended testicles  Cystoscopy Procedure Note  Patient identification was confirmed, informed consent was obtained, and patient was prepped using Betadine solution.  Lidocaine jelly was administered per urethral meatus.     Pre-Procedure: - Inspection reveals a normal caliber urethral meatus.  Procedure: The flexible cystoscope was introduced without difficulty -Bulbar stricture present estimated at <10 [place]   Post-Procedure: - Patient tolerated the procedure well  Assessment/ Plan: 85 year old male with recurrent urethral stricture.  No significant PVR.  Previous urine had significant pyuria and urine culture grew >100,000 colonies but no predominant organism.  I was hesitant to dilate in the office without antibiotic coverage and he will be scheduled in the OR.  Will also schedule a cystogram to evaluate for possible fistula.   Abbie Sons, MD

## 2018-09-06 NOTE — Progress Notes (Signed)
09/06/2018 2:10 PM   [name] [name] Mar 22, 1923 [id number]  Referring provider: Owens Loffler, MD [address] [name], [name] [address]  Chief Complaint  Patient presents with  . Cysto   Urologic problem list:  1.Bladder foreign body-evaluated for microhematuria in 2016 and found to have a bladder wall calcification. He underwent cystolitholapaxy with intraoperative findings of encrusted hernia mesh within the bladder. Due to an extreme anterior location the mesh could not be completely removed endoscopically. He was asymptomatic and declined further treatment.  2. Clinical stage T1c adenocarcinoma of the prostate in 2002 treated with radiation therapy.He elected to discontinue PSA testing  3.  Dilation of a proximal bulbar urethral stricture 01/2017.  HPI: 85 year old male with a 3-week history of increased odor to the urine, increasing urinary incontinence and urinary hesitancy.  A PVR was 15 mL.  Cystoscopy today shows a recurrent bulbar urethral stricture.  He states 2 weeks ago he was draining a large amount of clear fluid through a site near a previous herniorrhaphy scar.  It presently is leaking a small amount of clear fluid and he changes a dressing twice daily.  He is scheduled for cystoscopy with urethral dilation and cystogram to evaluate for possible vesicocutaneous fistula.   PMH: Past Medical History:  Diagnosis Date  . Cancer ([name])   . Cardiac pacemaker in situ 07/2005   symptomatic bradycardia  . CKD (chronic kidney disease) stage 3, GFR 30-59 ml/min ([name]) 04/13/2017  . Diverticulosis of colon (without mention of hemorrhage)   . 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 GERD (gastroesophageal reflux disease)   . GI hemorrhage   . Hyperlipidemia   . Hypertension   . Lower extremity edema   . Neck arthritis, Severe, multi-level 06/09/2013  . Paroxysmal atrial  fibrillation ([name])    s/p failed ablation  . Peptic ulcer, unspecified site, unspecified as acute or chronic, without mention of hemorrhage, perforation, or obstruction 1975  . Personal history of malignant neoplasm of prostate 12/2000   5 weeks radiation, radiation seeds  . Radiation proctitis   . Sinoatrial node dysfunction ([name])   . Unspecified glaucoma(365.9)   . Unspecified hypothyroidism   . Vitamin B12 deficiency     Surgical History: Past Surgical History:  Procedure Laterality Date  . CARDIAC CATHETERIZATION    . CARDIAC ELECTROPHYSIOLOGY [place] AND ABLATION  2001, 2003   failure  . CATARACT EXTRACTION    . CYSTOSCOPY WITH URETHRAL DILATATION N/A 01/29/2017   Procedure: CYSTOSCOPY WITH URETHRAL DILATATION;  Surgeon: Abbie Sons, MD;  Location: [facility] ORS;  Service: Urology;  Laterality: N/A;  . EP IMPLANTABLE DEVICE N/A 02/24/2016   Procedure: PPM Generator Changeout;  Surgeon: Deboraha Sprang, MD;  Location: [facility];  Service: Cardiovascular;  Laterality: N/A;  . gastric ulcer surgery    . HERNIA REPAIR  1994, 2001, 2003   s/p drainage and complications, 03/624, 11/4852 mesh removal  . INSERT / REPLACE / REMOVE PACEMAKER  07/2005   symptomatic bradycardia  . kidney stones    . NOSE SURGERY     cancer removed   . PROSTATE SURGERY    . SKIN CANCER DESTRUCTION    . TOOTH EXTRACTION      Home Medications:  Allergies as of 09/06/2018      Reactions   Penicillins Rash, Other (See Comments)   Has patient had a PCN reaction causing immediate  rash, facial/tongue/throat swelling, SOB or lightheadedness with hypotension: {no Has patient had a PCN reaction causing severe rash involving mucus membranes or skin necrosis: {no Has patient had a PCN reaction that required hospitalization {no Has patient had a PCN reaction occurring within the last 10 years: {no If all of the above answers are "NO", then may proceed with Cephalosporin use.      Medication List        Accurate as of September 06, 2018  2:10 PM. If you have any questions, ask your nurse or doctor.        acetaminophen 500 MG tablet Commonly known as:  TYLENOL Take 500 mg by mouth every 6 (six) hours as needed for moderate pain or headache.   alendronate 70 MG tablet Commonly known as:  FOSAMAX TAKE 1 TABLET BY MOUTH EVERY 7 DAYS WITH A FULL GLASS OF WATER ON AN EMPTY STOMACH   amiodarone 200 MG tablet Commonly known as:  PACERONE TAKE 1/2 TABLET BY MOUTH DAILY   BENEFIBER DRINK MIX PO Take 1 scoop by mouth daily.   clindamycin 300 MG capsule Commonly known as:  CLEOCIN Take 600 mg by mouth See admin instructions. Take 600 mg by mouth 1 hour prior to dental procedure   docusate sodium 50 MG capsule Commonly known as:  COLACE Take 50 mg by mouth 2 (two) times daily as needed for mild constipation.   dorzolamide-timolol 22.3-6.8 MG/ML ophthalmic solution Commonly known as:  COSOPT Place 1 drop into both eyes 2 (two) times daily.   ferrous sulfate 325 (65 FE) MG EC tablet Take 325 mg by mouth daily with breakfast.   furosemide 20 MG tablet Commonly known as:  LASIX Take 2 tablets (40 mg) by mouth once every other day as directed   levothyroxine 100 MCG tablet Commonly known as:  SYNTHROID TAKE 1 TABLET BY MOUTH DAILY BEFORE BREAKFAST   loperamide 2 MG capsule Commonly known as:  IMODIUM Take 2 mg by mouth as needed for diarrhea or loose stools.   multivitamin tablet Take 1 tablet by mouth daily.   nitrofurantoin (macrocrystal-monohydrate) 100 MG capsule Commonly known as:  MACROBID Take 1 capsule (100 mg total) by mouth every 12 (twelve) hours.   pantoprazole 40 MG tablet Commonly known as:  PROTONIX TAKE 1 TABLET(40 MG) BY MOUTH DAILY   polyethylene glycol 17 g packet Commonly known as:  MIRALAX / GLYCOLAX Take 17 g by mouth daily as needed.   pravastatin 40 MG tablet Commonly known as:  PRAVACHOL TAKE ONE-HALF TABLET BY MOUTH DAILY   protein supplement Powd  Take 1 scoop by mouth 3 (three) times daily with meals.   simethicone 125 MG chewable tablet Commonly known as:  MYLICON Chew 595 mg by mouth every 6 (six) hours as needed for flatulence.   tamsulosin 0.4 MG Caps capsule Commonly known as:  FLOMAX TAKE 1 CAPSULE(0.4 MG) BY MOUTH DAILY   VITAMIN B-12 IJ Inject 1,000 mcg as directed every 30 (thirty) days.   warfarin 1 MG tablet Commonly known as:  COUMADIN Take as directed by the anticoagulation clinic. If you are unsure how to take this medication, talk to your nurse or doctor. Original instructions:  TAKE 1 1/2 TABLETS BY MOUTH DAILY AS DIRECTED       Allergies:  Allergies  Allergen Reactions  . Penicillins Rash and Other (See Comments)    Has patient had a PCN reaction causing immediate rash, facial/tongue/throat swelling, SOB or lightheadedness with hypotension: {no Has patient  had a PCN reaction causing severe rash involving mucus membranes or skin necrosis: {no Has patient had a PCN reaction that required hospitalization {no Has patient had a PCN reaction occurring within the last 10 years: {no If all of the above answers are "NO", then may proceed with Cephalosporin use.    Family History: Family History  Problem Relation Age of Onset  . Breast cancer Mother   . Bone cancer Father   . Alcohol abuse Other   . Coronary artery disease Other   . Dementia Other   . Diabetes Other     Social History:  reports that he has never smoked. He has never used smokeless tobacco. He reports that he does not drink alcohol or use drugs.  ROS: Otherwise noncontributory except as per the HPI  Physical Exam: BP 100/67   Pulse 82   Ht 5\' 9"  (1.753 m)   Wt 131 lb (59.4 kg)   BMI 19.35 kg/m   Constitutional:  Alert and oriented, No acute distress. HEENT: [place] AT, moist mucus membranes.  Trachea midline, no masses. Cardiovascular: No clubbing, cyanosis, or edema.  RRR Respiratory: Normal respiratory effort, no increased work of  breathing.  Clear GI: Abdomen is soft, nontender, nondistended, no abdominal masses GU: No CVA tenderness Lymph: No cervical or inguinal lymphadenopathy. Skin: No rashes, bruises or suspicious lesions. Neurologic: Grossly intact, no focal deficits, moving all 4 extremities. Psychiatric: Normal mood and affect.   Assessment & Plan:   85 year old male with recurrent bulbar urethral stricture and possible vesicocutaneous fistula.  He presents for cystoscopy with urethral dilation and cystogram.  The procedure has been discussed in detail clear potential risks of bleeding, infection and the need for additional treatment.  The high likelihood of recurrent stricture was also discussed.  He indicated all questions were answered and desires to proceed.   Abbie Sons, [place] [address], [place], [place] [address] [phone]

## 2018-09-06 NOTE — Telephone Encounter (Signed)
   [facility] Pre-operative Risk Assessment    Request for surgical clearance:  1. What type of surgery is being performed? Paroxysmal Afib  2. When is this surgery scheduled? 09/21/2018  3. What type of clearance is required (medical clearance vs. Pharmacy clearance to hold med vs. Both)? both  4. Are there any medications that need to be held prior to surgery and how long? Coumadin 5 days prior to surgery  5. Practice name and name of physician performing surgery? Dr. John Giovanni  6. What is your office phone number [phone]   7.   What is your office fax number [phone]  8.   Anesthesia type (None, local, MAC, general) ? Not listed   Lucienne Minks 09/06/2018, 3:47 PM  _________________________________________________________________   (provider comments below)

## 2018-09-06 NOTE — H&P (View-Only) (Signed)
09/06/2018 2:10 PM   [name] [name] 1922-09-06 [id number]  Referring provider: Owens Loffler, MD [address] [place], [place] [address]  Chief Complaint  Patient presents with  . Cysto   Urologic problem list:  1.Bladder foreign body-evaluated for microhematuria in 2016 and found to have a bladder wall calcification. He underwent cystolitholapaxy with intraoperative findings of encrusted hernia mesh within the bladder. Due to an extreme anterior location the mesh could not be completely removed endoscopically. He was asymptomatic and declined further treatment.  2. Clinical stage T1c adenocarcinoma of the prostate in 2002 treated with radiation therapy.He elected to discontinue PSA testing  3.  Dilation of a proximal bulbar urethral stricture 01/2017.  HPI: 85 year old male with a 3-week history of increased odor to the urine, increasing urinary incontinence and urinary hesitancy.  A PVR was 15 mL.  Cystoscopy today shows a recurrent bulbar urethral stricture.  He states 2 weeks ago he was draining a large amount of clear fluid through a site near a previous herniorrhaphy scar.  It presently is leaking a small amount of clear fluid and he changes a dressing twice daily.  He is scheduled for cystoscopy with urethral dilation and cystogram to evaluate for possible vesicocutaneous fistula.   PMH: Past Medical History:  Diagnosis Date  . Cancer ([place])   . Cardiac pacemaker in situ 07/2005   symptomatic bradycardia  . CKD (chronic kidney disease) stage 3, GFR 30-59 ml/min ([name]) 04/13/2017  . Diverticulosis of colon (without mention of hemorrhage)   . 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 GERD (gastroesophageal reflux disease)   . GI hemorrhage   . Hyperlipidemia   . Hypertension   . Lower extremity edema   . Neck arthritis, Severe, multi-level 06/09/2013  . Paroxysmal atrial  fibrillation ([name])    s/p failed ablation  . Peptic ulcer, unspecified site, unspecified as acute or chronic, without mention of hemorrhage, perforation, or obstruction 1975  . Personal history of malignant neoplasm of prostate 12/2000   5 weeks radiation, radiation seeds  . Radiation proctitis   . Sinoatrial node dysfunction ([name])   . Unspecified glaucoma(365.9)   . Unspecified hypothyroidism   . Vitamin B12 deficiency     Surgical History: Past Surgical History:  Procedure Laterality Date  . CARDIAC CATHETERIZATION    . CARDIAC ELECTROPHYSIOLOGY [place] AND ABLATION  2001, 2003   failure  . CATARACT EXTRACTION    . CYSTOSCOPY WITH URETHRAL DILATATION N/A 01/29/2017   Procedure: CYSTOSCOPY WITH URETHRAL DILATATION;  Surgeon: Abbie Sons, MD;  Location: [facility];  Service: Urology;  Laterality: N/A;  . EP IMPLANTABLE DEVICE N/A 02/24/2016   Procedure: PPM Generator Changeout;  Surgeon: Deboraha Sprang, MD;  Location: [facility];  Service: Cardiovascular;  Laterality: N/A;  . gastric ulcer surgery    . HERNIA REPAIR  1994, 2001, 2003   s/p drainage and complications, 07/8848, 05/7739 mesh removal  . INSERT / REPLACE / REMOVE PACEMAKER  07/2005   symptomatic bradycardia  . kidney stones    . NOSE SURGERY     cancer removed   . PROSTATE SURGERY    . SKIN CANCER DESTRUCTION    . TOOTH EXTRACTION      Home Medications:  Allergies as of 09/06/2018      Reactions   Penicillins Rash, Other (See Comments)   Has patient had a PCN reaction causing immediate  rash, facial/tongue/throat swelling, SOB or lightheadedness with hypotension: {no Has patient had a PCN reaction causing severe rash involving mucus membranes or skin necrosis: {no Has patient had a PCN reaction that required hospitalization {no Has patient had a PCN reaction occurring within the last 10 years: {no If all of the above answers are "NO", then may proceed with Cephalosporin use.      Medication List        Accurate as of September 06, 2018  2:10 PM. If you have any questions, ask your nurse or doctor.        acetaminophen 500 MG tablet Commonly known as:  TYLENOL Take 500 mg by mouth every 6 (six) hours as needed for moderate pain or headache.   alendronate 70 MG tablet Commonly known as:  FOSAMAX TAKE 1 TABLET BY MOUTH EVERY 7 DAYS WITH A FULL GLASS OF WATER ON AN EMPTY STOMACH   amiodarone 200 MG tablet Commonly known as:  PACERONE TAKE 1/2 TABLET BY MOUTH DAILY   BENEFIBER DRINK MIX PO Take 1 scoop by mouth daily.   clindamycin 300 MG capsule Commonly known as:  CLEOCIN Take 600 mg by mouth See admin instructions. Take 600 mg by mouth 1 hour prior to dental procedure   docusate sodium 50 MG capsule Commonly known as:  COLACE Take 50 mg by mouth 2 (two) times daily as needed for mild constipation.   dorzolamide-timolol 22.3-6.8 MG/ML ophthalmic solution Commonly known as:  COSOPT Place 1 drop into both eyes 2 (two) times daily.   ferrous sulfate 325 (65 FE) MG EC tablet Take 325 mg by mouth daily with breakfast.   furosemide 20 MG tablet Commonly known as:  LASIX Take 2 tablets (40 mg) by mouth once every other day as directed   levothyroxine 100 MCG tablet Commonly known as:  SYNTHROID TAKE 1 TABLET BY MOUTH DAILY BEFORE BREAKFAST   loperamide 2 MG capsule Commonly known as:  IMODIUM Take 2 mg by mouth as needed for diarrhea or loose stools.   multivitamin tablet Take 1 tablet by mouth daily.   nitrofurantoin (macrocrystal-monohydrate) 100 MG capsule Commonly known as:  MACROBID Take 1 capsule (100 mg total) by mouth every 12 (twelve) hours.   pantoprazole 40 MG tablet Commonly known as:  PROTONIX TAKE 1 TABLET(40 MG) BY MOUTH DAILY   polyethylene glycol 17 g packet Commonly known as:  MIRALAX / GLYCOLAX Take 17 g by mouth daily as needed.   pravastatin 40 MG tablet Commonly known as:  PRAVACHOL TAKE ONE-HALF TABLET BY MOUTH DAILY   protein supplement Powd  Take 1 scoop by mouth 3 (three) times daily with meals.   simethicone 125 MG chewable tablet Commonly known as:  MYLICON Chew 678 mg by mouth every 6 (six) hours as needed for flatulence.   tamsulosin 0.4 MG Caps capsule Commonly known as:  FLOMAX TAKE 1 CAPSULE(0.4 MG) BY MOUTH DAILY   VITAMIN B-12 IJ Inject 1,000 mcg as directed every 30 (thirty) days.   warfarin 1 MG tablet Commonly known as:  COUMADIN Take as directed by the anticoagulation clinic. If you are unsure how to take this medication, talk to your nurse or doctor. Original instructions:  TAKE 1 1/2 TABLETS BY MOUTH DAILY AS DIRECTED       Allergies:  Allergies  Allergen Reactions  . Penicillins Rash and Other (See Comments)    Has patient had a PCN reaction causing immediate rash, facial/tongue/throat swelling, SOB or lightheadedness with hypotension: {no Has patient  had a PCN reaction causing severe rash involving mucus membranes or skin necrosis: {no Has patient had a PCN reaction that required hospitalization {no Has patient had a PCN reaction occurring within the last 10 years: {no If all of the above answers are "NO", then may proceed with Cephalosporin use.    Family History: Family History  Problem Relation Age of Onset  . Breast cancer Mother   . Bone cancer Father   . Alcohol abuse Other   . Coronary artery disease Other   . Dementia Other   . Diabetes Other     Social History:  reports that he has never smoked. He has never used smokeless tobacco. He reports that he does not drink alcohol or use drugs.  ROS: Otherwise noncontributory except as per the HPI  Physical Exam: BP 100/67   Pulse 82   Ht 5\' 9"  (1.753 m)   Wt 131 lb (59.4 kg)   BMI 19.35 kg/m   Constitutional:  Alert and oriented, No acute distress. HEENT: [place] AT, moist mucus membranes.  Trachea midline, no masses. Cardiovascular: No clubbing, cyanosis, or edema.  RRR Respiratory: Normal respiratory effort, no increased work of  breathing.  Clear GI: Abdomen is soft, nontender, nondistended, no abdominal masses GU: No CVA tenderness Lymph: No cervical or inguinal lymphadenopathy. Skin: No rashes, bruises or suspicious lesions. Neurologic: Grossly intact, no focal deficits, moving all 4 extremities. Psychiatric: Normal mood and affect.   Assessment & Plan:   85 year old male with recurrent bulbar urethral stricture and possible vesicocutaneous fistula.  He presents for cystoscopy with urethral dilation and cystogram.  The procedure has been discussed in detail clear potential risks of bleeding, infection and the need for additional treatment.  The high likelihood of recurrent stricture was also discussed.  He indicated all questions were answered and desires to proceed.   Abbie Sons, [place] [address] [place], [place] [address] [phone]

## 2018-09-06 NOTE — Telephone Encounter (Signed)
Patient was given the [facility] Information form below as well as the Instructions for Pre-Admission Testing form & a map of [facility].   [place], [place], [place] [address] Telephone: [phone] Fax: [phone]   Thank you for choosing [facility] for your upcoming surgery!  We are always here to assist in your urological needs.  Please read the following information with specific details for your upcoming appointments related to your surgery. Please contact Amy at [phone] Option 3 with any questions.  The Name of Your Surgery: Cysto Urethral Dilation, Cystogram Your Surgery Date: 09/21/18 Your Surgeon: John Giovanni  Please call Same Day Surgery at [phone] between the hours of 1pm-3pm one day prior to your surgery. They will inform you of the time to arrive at Same Day Surgery which is located on the second floor of the [facility].   Please refer to the attached letter regarding instructions for Pre-Admission Testing. You will receive a call from the [facility] office regarding your appointment with them.  The Pre-Admission Testing office is located at [place], on the first floor of the [place] [facility] in [place] (office is to the right as you enter through the [employer] of the [facility]). Please have all medications you are currently taking and your insurance card available.   Patient was advised to have nothing to eat or drink after midnight the night prior to surgery except that he may have only water until 2 hours before surgery with nothing to drink within 2 hours of surgery.  The patient states he currently takes Coumadin & was informed to hold medication for 5 days prior to surgery beginning on 09/16/18 if approved by Dr.Steven Caryl Comes. Patient's questions were answered and he  expressed understanding of these instructions.

## 2018-09-07 NOTE — Telephone Encounter (Signed)
Patient with diagnosis of Afib on warfarin for anticoagulation.    Procedure: cystoscopy with urethral dilation Date of procedure: 09/21/18  CHADS2-VASc score of  3 (CHF, HTN, AGE, DM2, stroke/tia x 2, CAD, AGE, male)  Per office protocol, patient can hold warfarin for 5 days prior to procedure.   Patient will not need bridging with Lovenox (enoxaparin) around procedure.

## 2018-09-07 NOTE — Telephone Encounter (Signed)
Chart reviewed. Clearance is for CYSTOSCOPY WITH URETHRAL DILATATION, Scheduled 09/21/18. He has Paroxysmal atrial fibrillation. I will route clearance to pharmacy to address anticoagulation.

## 2018-09-07 NOTE — Telephone Encounter (Signed)
   Primary Cardiologist: Virl Axe, MD  Chart reviewed as part of pre-operative protocol coverage. Given past medical history and time since last visit, based on ACC/AHA guidelines, [name] [name] would be at acceptable risk for the planned procedure without further cardiovascular testing.   Per office protocol, patient can hold warfarin for 5 days prior to procedure.   Patient will not need bridging with Lovenox (enoxaparin) around procedure.  I will route this recommendation to the requesting party via Epic fax function and remove from pre-op pool.  Please call with questions.  Lyda Jester, PA-C 09/07/2018, 9:18 AM

## 2018-09-09 ENCOUNTER — Other Ambulatory Visit: Payer: Self-pay | Admitting: Urology

## 2018-09-09 LAB — CULTURE, URINE COMPREHENSIVE

## 2018-09-09 MED ORDER — CEFUROXIME AXETIL 250 MG PO TABS: 250.0000 mg | ORAL_TABLET | Freq: Two times a day (BID) | ORAL | 0 refills | Status: AC

## 2018-09-12 ENCOUNTER — Telehealth: Payer: Self-pay

## 2018-09-12 NOTE — Telephone Encounter (Signed)
Patient notified on vmail per DPR 

## 2018-09-12 NOTE — Telephone Encounter (Signed)
-----   Message from Abbie Sons, MD sent at 09/09/2018  3:54 PM EDT ----- Urine culture was positive.  Antibiotic Rx was sent to pharmacy

## 2018-09-13 ENCOUNTER — Other Ambulatory Visit: Payer: Self-pay | Admitting: Radiology

## 2018-09-13 DIAGNOSIS — N35912 Unspecified bulbous urethral stricture, male: Secondary | ICD-10-CM

## 2018-09-14 NOTE — Telephone Encounter (Signed)
Spoke w/ pt.  Advised him that he will need to hold his coumadin x 5 days prior to procedure on 6/24, starting on 6/19. Advised him that if ok'd by performing MD, he may restart coumadin the evening of his procedure and take extra 1/2 tablet x 2 days (2 whole tabs on 6/24 & 6/25). Rescheduled his next INR check from 6/22 to 09/28/18 @ 2:30. He verbalizes understanding and is appreciative of the call.

## 2018-09-15 ENCOUNTER — Ambulatory Visit (INDEPENDENT_AMBULATORY_CARE_PROVIDER_SITE_OTHER): Payer: Medicare Other | Admitting: *Deleted

## 2018-09-15 ENCOUNTER — Other Ambulatory Visit: Payer: Self-pay

## 2018-09-15 DIAGNOSIS — E538 Deficiency of other specified B group vitamins: Secondary | ICD-10-CM | POA: Diagnosis not present

## 2018-09-15 MED ORDER — CYANOCOBALAMIN 1000 MCG/ML IJ SOLN
1000.0000 ug | Freq: Once | INTRAMUSCULAR | Status: AC
  Administered 2018-09-15: 1000 ug via INTRAMUSCULAR

## 2018-09-15 NOTE — Progress Notes (Signed)
Per orders of Dr. Eliezer Lofts ( For Dr Frederico Hamman Copland), injection of B12 1000 mcg/mL given by Darwin Rothlisberger. Patient tolerated injection well.

## 2018-09-16 ENCOUNTER — Other Ambulatory Visit: Payer: Self-pay

## 2018-09-16 ENCOUNTER — Encounter
Admission: RE | Admit: 2018-09-16 | Discharge: 2018-09-16 | Disposition: A | Discharge status: Home / Self Care | Payer: Medicare Other | Source: Ambulatory Visit | Attending: Urology | Admitting: Urology

## 2018-09-16 DIAGNOSIS — Z950 Presence of cardiac pacemaker: Secondary | ICD-10-CM | POA: Diagnosis not present

## 2018-09-16 DIAGNOSIS — Z1159 Encounter for screening for other viral diseases: Secondary | ICD-10-CM | POA: Diagnosis not present

## 2018-09-16 DIAGNOSIS — I480 Paroxysmal atrial fibrillation: Secondary | ICD-10-CM | POA: Diagnosis not present

## 2018-09-16 DIAGNOSIS — I4891 Unspecified atrial fibrillation: Secondary | ICD-10-CM | POA: Diagnosis not present

## 2018-09-16 DIAGNOSIS — Z01818 Encounter for other preprocedural examination: Secondary | ICD-10-CM | POA: Diagnosis not present

## 2018-09-16 HISTORY — DX: Presence of automatic (implantable) cardiac defibrillator: Z95.810

## 2018-09-16 HISTORY — DX: Anemia, unspecified: D64.9

## 2018-09-16 HISTORY — DX: Cardiac arrhythmia, unspecified: I49.9

## 2018-09-16 LAB — CBC
HCT: 36.2 % — ABNORMAL LOW (ref 39.0–52.0)
Hemoglobin: 11.3 g/dL — ABNORMAL LOW (ref 13.0–17.0)
MCH: 31.9 pg (ref 26.0–34.0)
MCHC: 31.2 g/dL (ref 30.0–36.0)
MCV: 102.3 fL — ABNORMAL HIGH (ref 80.0–100.0)
Platelets: 178 10*3/uL (ref 150–400)
RBC: 3.54 MIL/uL — ABNORMAL LOW (ref 4.22–5.81)
RDW: 13.2 % (ref 11.5–15.5)
WBC: 5.4 10*3/uL (ref 4.0–10.5)
nRBC: 0 % (ref 0.0–0.2)

## 2018-09-16 LAB — BASIC METABOLIC PANEL
Anion gap: 7 (ref 5–15)
BUN: 35 mg/dL — ABNORMAL HIGH (ref 8–23)
CO2: 26 mmol/L (ref 22–32)
Calcium: 8.7 mg/dL — ABNORMAL LOW (ref 8.9–10.3)
Chloride: 106 mmol/L (ref 98–111)
Creatinine, Ser: 2.04 mg/dL — ABNORMAL HIGH (ref 0.61–1.24)
GFR calc Af Amer: 32 mL/min — ABNORMAL LOW (ref >60)
GFR calc non Af Amer: 27 mL/min — ABNORMAL LOW (ref >60)
Glucose, Bld: 116 mg/dL — ABNORMAL HIGH (ref 70–99)
Potassium: 3.1 mmol/L — ABNORMAL LOW (ref 3.5–5.1)
Sodium: 139 mmol/L (ref 135–145)

## 2018-09-16 LAB — PROTIME-INR
INR: 2.5 — ABNORMAL HIGH (ref 0.8–1.2)
Prothrombin Time: 26.3 seconds — ABNORMAL HIGH (ref 11.4–15.2)

## 2018-09-16 NOTE — Pre-Procedure Instructions (Signed)
Owens Loffler, MD  Physician  Family Medicine  Progress Notes  Signed  Encounter Date: 04/18/2018          Signed     Expand All Collapse All     Show:Clear all  ManualTemplateCopied Added by:  Owens Loffler, MD [place] for details                                                                                                         Dr. Frederico Hamman T. Copland, MD, [facility]  Primary Care and Sports Medicine  [place], [address]  Phone: [phone]  Fax: [phone]  04/18/2018  Patient: [name] [name], MRN: [id number], DOB: [birth year], 85 y.o.  Primary Physician: Owens Loffler, MD         Chief Complaint   Patient presents with   . Annual Exam     Part 2   Subjective:    [name] [name] is a 85 y.o. very pleasant male patient who presents with the following:  85 yo male here for f/u - had medicare wellness 04/08/2018 by Mrs. [name] [name].  He has a history of AF, pacemaker, HTN, HLD, hypothyroidism, h/o prostate cancer in remission, CKD 3, chronic coumadin use, OA, b12 long-term deficiency.  HTN: Tolerating all medications without side effects  Stable and at goal  No CP, no sob. No HA.          BP Readings from Last 3 Encounters:    04/18/18 110/68    04/08/18 110/78    01/25/18 (!) 809/98    Basic Metabolic Panel:      Labs (Brief)                                                                                         Electronically signed by Owens Loffler, MD at 04/18/2018 10:45 AM        Office Visit on 04/18/2018   Detailed Report

## 2018-09-16 NOTE — Patient Instructions (Signed)
Your procedure is scheduled on: 09-21-18 [facility] Report to Same Day Surgery 2nd floor medical mall [facility] Entrance-take elevator on left to 2nd floor.  Check in with surgery information desk.) To find out your arrival time please call [phone] between 1PM - 3PM on 09-20-18 TUESDAY  Remember: Instructions that are not followed completely may result in serious medical risk, up to and including death, or upon the discretion of your surgeon and anesthesiologist your surgery may need to be rescheduled.    _x___ 1. Do not eat food after midnight the night before your procedure. NO GUM OR CANDY AFTER MIDNIGHT.  You may drink clear liquids up to 2 hours before you are scheduled to arrive at the hospital for your procedure.  Do not drink clear liquids within 2 hours of your scheduled arrival to the hospital.  Clear liquids include  --Water or Apple juice without pulp  --Clear carbohydrate beverage such as ClearFast or Gatorade  --Black Coffee or Clear Tea (No milk, no creamers, do not add anything to the coffee or Tea   ____Ensure clear carbohydrate drink on the way to the hospital for bariatric patients  ____Ensure clear carbohydrate drink 3 hours before surgery for Dr Dwyane Luo patients if physician instructed.      __x__ 2. No Alcohol for 24 hours before or after surgery.   __x__3. No Smoking or e-cigarettes for 24 prior to surgery.  Do not use any chewable tobacco products for at least 6 hour prior to surgery   ____  4. Bring all medications with you on the day of surgery if instructed.    __x__ 5. Notify your doctor if there is any change in your medical condition     (cold, fever, infections).    x___6. On the morning of surgery brush your teeth with toothpaste and water.  You may rinse your mouth with mouth wash if you wish.  Do not swallow any toothpaste or mouthwash.   Do not wear jewelry, make-up, hairpins, clips or nail polish.  Do not wear lotions, powders, or  perfumes. You may wear deodorant.  Do not shave 48 hours prior to surgery. Men may shave face and neck.  Do not bring valuables to the hospital.    [facility] is not responsible for any belongings or valuables.               Contacts, dentures or bridgework may not be worn into surgery.  Leave your suitcase in the car. After surgery it may be brought to your room.  For patients admitted to the hospital, discharge time is determined by your treatment team.  _  Patients discharged the day of surgery will not be allowed to drive home.  You will need someone to drive you home and stay with you the night of your procedure.    Please read over the following fact sheets that you were given:   [facility] Preparing for Surgery   _x___ TAKE THE FOLLOWING MEDICATION THE MORNING OF SURGERY WITH A SMALL SIP OF WATER. These include:  1. LEVOTHYROXINE (SYNTHROID)  2. FLOMAX (TAMSULOSIN)  3. PROTONIX (PANTOPRAZOLE)  4. TAKE AN EXTRA PROTONIX THE NIGHT BEFORE YOUR SURGERY  5.  6.  ____Fleets enema or Magnesium Citrate as directed.   ____ Use CHG Soap or sage wipes as directed on instruction sheet   ____ Use inhalers on the day of surgery and bring to hospital day of surgery  ____ Stop Metformin and Janumet 2 days prior  to surgery.    ____ Take 1/2 of usual insulin dose the night before surgery and none on the morning surgery.   _x___ Follow recommendations from Cardiologist, Pulmonologist or PCP regarding stopping Aspirin, Coumadin, Plavix ,Eliquis, Effient, or Pradaxa, and Pletal-INSTRUCTED TO STOP COUMADIN (WARFARIN) ON 09-15-18 BY OFFICE  X____Stop Anti-inflammatories such as Advil, Aleve, Ibuprofen, Motrin, Naproxen, Naprosyn, Goodies powders or aspirin products NOW-OK to take Tylenol    ____ Stop supplements until after surgery.     ____ Bring C-Pap to the hospital.

## 2018-09-16 NOTE — Pre-Procedure Instructions (Signed)
Deboraha Sprang, MD  Physician  Cardiology  Progress Notes     Signed  Encounter Date:  01/25/2018          Signed      Expand All Collapse All    Show:Clear all [x] Manual[x] Template[x] Copied  Added by: [x] Deboraha Sprang, MD  [] Hover for details    Electrophysiology Office Note    Date:  01/25/2018   ID:  [name] [name], DOB 1922-04-28, MRN [id number]  PCP:  Owens Loffler, MD  Cardiologist:  Primary Electrophysiologist:  Virl Axe, MD          Chief Complaint  Patient presents with  . other    6 mo f/u. Medications reviewed verbally.      History of Present Illness: [name] [name] is a 85 y.o. male is seen today in followup for  pacemaker implanted for tachybradycardia syndrome in  the context of paroxysmal atrial fibrillation with prior failed ablation. He takes amiodarone. He has had mostly sinus ( 97%) from interrogation 7/17  He underwent device generator replacement 11/17  He has had significant increases in peripheral edema.  He attributes this to the fact that he is helping his brother moving to assisted living and has been on his feet more than normal.  His diuretic dose of 20 mg frequently does not cause urination.  Denies shortness of breath or chest pain but does have significant fatigue and is often just tired.   Date Cr K TSH LFTs Hgb PFTs  7/17    0.71      8/18     12.3   1/19    0.3 15 10.5    8/19 1.93 3.8 0.21  10.4             His last TSH was low.  T4 and T3 were in the normal/low range respectively.  Dr. Arlyn Dunning notes were reviewed and he wants to keep the thyroid replacement stable      Past Medical History:  Diagnosis Date  . Cancer ([place])   . Cardiac pacemaker in situ 07/2005   symptomatic bradycardia  . CKD (chronic kidney disease) stage 3, GFR 30-59 ml/min ([name]) 04/13/2017  . Diverticulosis of colon (without mention of hemorrhage)   . 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 GERD (gastroesophageal reflux disease)   . GI hemorrhage   . Hyperlipidemia   . Hypertension   . Lower extremity edema   . Neck arthritis, Severe, multi-level 06/09/2013  . Paroxysmal atrial fibrillation ([name])    s/p failed ablation  . Peptic ulcer, unspecified site, unspecified as acute or chronic, without mention of hemorrhage, perforation, or obstruction 1975  . Personal history of malignant neoplasm of prostate 12/2000   5 weeks radiation, radiation seeds  . Radiation proctitis   . Sinoatrial node dysfunction ([name])   . Unspecified glaucoma(365.9)   . Unspecified hypothyroidism   . Vitamin B12 deficiency         Past Surgical History:  Procedure Laterality Date  . CARDIAC CATHETERIZATION    . CARDIAC ELECTROPHYSIOLOGY [place] AND ABLATION  2001, 2003   failure  . CATARACT EXTRACTION    . CYSTOSCOPY WITH URETHRAL DILATATION N/A 01/29/2017   Procedure: CYSTOSCOPY WITH URETHRAL DILATATION;  Surgeon: Abbie Sons, MD;  Location: [facility] ORS;  Service: Urology;  Laterality: N/A;  . EP IMPLANTABLE DEVICE N/A 02/24/2016   Procedure: PPM Generator Changeout;  Surgeon:  Deboraha Sprang, MD;  Location: [place] [facility];  Service: Cardiovascular;  Laterality: N/A;  . gastric ulcer surgery    . HERNIA REPAIR  1994, 2001, 2003   s/p drainage and complications, 05/9935, 03/6965 mesh removal  . INSERT / REPLACE / REMOVE PACEMAKER  07/2005   symptomatic bradycardia  . kidney stones    . NOSE SURGERY     cancer removed   . PROSTATE SURGERY    . SKIN CANCER DESTRUCTION    . TOOTH EXTRACTION             Current Outpatient Medications  Medication Sig Dispense Refill  . acetaminophen (TYLENOL) 500 MG tablet Take 500 mg by mouth every 6 (six) hours as needed for moderate pain or headache.    . alendronate (FOSAMAX) 70 MG tablet TAKE 1 TABLET BY MOUTH EVERY 7 DAYS. TAKE WITH A FULL GLASS OF  WATER ON AN EMPTY STOMACH 12 tablet 3  . amiodarone (PACERONE) 200 MG tablet TAKE 1/2 TABLET BY MOUTH DAILY 45 tablet 1  . clindamycin (CLEOCIN) 300 MG capsule Take 600 mg by mouth See admin instructions. Take 600 mg by mouth 1 hour prior to dental procedure    . Cyanocobalamin (VITAMIN B-12 IJ) Inject 1,000 mcg as directed every 30 (thirty) days.     Marland Kitchen docusate sodium (COLACE) 50 MG capsule Take 50 mg by mouth 2 (two) times daily as needed for mild constipation.    . dorzolamide-timolol (COSOPT) 22.3-6.8 MG/ML ophthalmic solution Place 1 drop into both eyes 2 (two) times daily.      . ferrous sulfate 325 (65 FE) MG EC tablet Take 325 mg by mouth daily with breakfast.      . furosemide (LASIX) 20 MG tablet Take 1 tablet (20 mg total) by mouth every other day as needed. 30 tablet 1  . levothyroxine (SYNTHROID, LEVOTHROID) 100 MCG tablet TAKE 1 TABLET BY MOUTH DAILY BEFORE BREAKFAST 30 tablet 11  . loperamide (IMODIUM) 2 MG capsule Take 2 mg by mouth as needed for diarrhea or loose stools.    . Multiple Vitamin (MULTIVITAMIN) tablet Take 1 tablet by mouth daily.      . pantoprazole (PROTONIX) 40 MG tablet TAKE 1 TABLET(40 MG) BY MOUTH DAILY 90 tablet 1  . polyethylene glycol (MIRALAX / GLYCOLAX) packet Take 17 g by mouth daily as needed.    . pravastatin (PRAVACHOL) 40 MG tablet TAKE ONE-HALF TABLET BY MOUTH DAILY 45 tablet 1  . protein supplement (RESOURCE BENEPROTEIN) POWD Take 1 scoop by mouth 3 (three) times daily with meals.    . simethicone (MYLICON) 893 MG chewable tablet Chew 125 mg by mouth every 6 (six) hours as needed for flatulence.    . tamsulosin (FLOMAX) 0.4 MG CAPS capsule TAKE 1 CAPSULE(0.4 MG) BY MOUTH DAILY 30 capsule 3  . warfarin (COUMADIN) 4 MG tablet Take as directed by coumadin clinic 90 tablet 1  . Wheat Dextrin (BENEFIBER DRINK MIX PO) Take 1 scoop by mouth daily.      No current facility-administered medications for this visit.     Allergies:    Penicillins   Social History:  The patient  reports that he has never smoked. He has never used smokeless tobacco. He reports that he does not drink alcohol or use drugs.   Family History:  The patient's    family history includes Alcohol abuse in his unknown relative; Bone cancer in his father; Breast cancer in his mother; Coronary artery disease in  his unknown relative; Dementia in his unknown relative; Diabetes in his unknown relative.    ROS:  Please see the history of present illness and past medical histor .   All other systems are reviewed and negative.    PHYSICAL EXAM: VS:  BP (!) 144/70 (BP Location: Left Arm, Patient Position: Sitting, Cuff Size: Normal)   Pulse 67   Ht 5\' 9"  (1.753 m)   Wt 137 lb 8 oz (62.4 kg)   BMI 20.31 kg/m  , BMI Body mass index is 20.31 kg/m. Well developed and nourished in no acute distress HENT normal Neck supple with JVP-7-8 cm  clear Irregular regular rate and rhythm, no murmurs or gallops Abd-soft with active BS No Clubbing cyanosis edema Skin-warm and dry+ L>R A & Oriented  Grossly normal sensory and motor function    ECG atrial paced at 67 Intervals 28/10/43 PVCs-frequent Device interrogation is reviewed today in detail.  See PaceArt for details.      Lipid Panel  Labs (Brief)          Component Value Date/Time   CHOL 123 10/08/2017 1438   TRIG 49.0 10/08/2017 1438   HDL 60.20 10/08/2017 1438   CHOLHDL 2 10/08/2017 1438   VLDL 9.8 10/08/2017 1438   LDLCALC 53 10/08/2017 1438          Wt Readings from Last 3 Encounters:  01/25/18 137 lb 8 oz (62.4 kg)  11/25/17 137 lb 8 oz (62.4 kg)  10/15/17 131 lb (59.4 kg)      Other studies Reviewed: As above    ASSESSMENT AND PLAN:  Atrial fibrillation - paroxysmal  Sinus node dysfunction   Pacemaker  [employer]  The patient's device was interrogated.  The information was reviewed. No changes were made in the programming.      Hypothyroidism-treated  Amiodarone therapy  anemia.  Edema-chronic  Renal dysfunction gd 3       Intermittent atrial fibrillation on blood thinner without obvious bleeding  Treated hypothyroidism with a low TSH.  Will ask Dr. Edilia Bo to consider down titration of thyroid replacement  Continue amiodarone therapy tolerating well.  Edema is increased and weight is up about 8 pounds.  We will have him increase his diuretics from 20 qod--40 qod     Signed, Virl Axe, MD  01/25/2018 10:11 AM     [facility] [address] [place] [address] [phone] (office) [phone] (fax)         Electronically signed by Deboraha Sprang, MD at 01/25/2018 10:25 AM   Office Visit on 01/25/2018     Detailed Report     Note shared with patient

## 2018-09-17 LAB — NOVEL CORONAVIRUS, NAA (HOSP ORDER, SEND-OUT TO REF LAB; TAT 18-24 HRS): SARS-CoV-2, NAA: NOT DETECTED

## 2018-09-18 ENCOUNTER — Other Ambulatory Visit: Payer: Self-pay

## 2018-09-18 ENCOUNTER — Emergency Department
Admission: EM | Admit: 2018-09-18 | Discharge: 2018-09-18 | Disposition: A | Discharge status: Home / Self Care | Payer: Medicare Other | Attending: Emergency Medicine | Admitting: Emergency Medicine

## 2018-09-18 DIAGNOSIS — N183 Chronic kidney disease, stage 3 (moderate): Secondary | ICD-10-CM | POA: Insufficient documentation

## 2018-09-18 DIAGNOSIS — Y998 Other external cause status: Secondary | ICD-10-CM | POA: Insufficient documentation

## 2018-09-18 DIAGNOSIS — Z7901 Long term (current) use of anticoagulants: Secondary | ICD-10-CM | POA: Insufficient documentation

## 2018-09-18 DIAGNOSIS — Z79899 Other long term (current) drug therapy: Secondary | ICD-10-CM | POA: Insufficient documentation

## 2018-09-18 DIAGNOSIS — E039 Hypothyroidism, unspecified: Secondary | ICD-10-CM | POA: Insufficient documentation

## 2018-09-18 DIAGNOSIS — W228XXA Striking against or struck by other objects, initial encounter: Secondary | ICD-10-CM | POA: Insufficient documentation

## 2018-09-18 DIAGNOSIS — I129 Hypertensive chronic kidney disease with stage 1 through stage 4 chronic kidney disease, or unspecified chronic kidney disease: Secondary | ICD-10-CM | POA: Diagnosis not present

## 2018-09-18 DIAGNOSIS — S51819A Laceration without foreign body of unspecified forearm, initial encounter: Secondary | ICD-10-CM

## 2018-09-18 DIAGNOSIS — Y929 Unspecified place or not applicable: Secondary | ICD-10-CM | POA: Diagnosis not present

## 2018-09-18 DIAGNOSIS — Z85840 Personal history of malignant neoplasm of eye: Secondary | ICD-10-CM | POA: Insufficient documentation

## 2018-09-18 DIAGNOSIS — S51811A Laceration without foreign body of right forearm, initial encounter: Secondary | ICD-10-CM | POA: Insufficient documentation

## 2018-09-18 DIAGNOSIS — Z8546 Personal history of malignant neoplasm of prostate: Secondary | ICD-10-CM | POA: Insufficient documentation

## 2018-09-18 DIAGNOSIS — Y9389 Activity, other specified: Secondary | ICD-10-CM | POA: Insufficient documentation

## 2018-09-18 MED ORDER — LIDOCAINE-EPINEPHRINE 2 %-1:100000 IJ SOLN
20.0000 mL | Freq: Once | INTRAMUSCULAR | Status: AC
  Administered 2018-09-18: 1 mL
  Filled 2018-09-18: qty 1

## 2018-09-18 NOTE — ED Notes (Signed)
Dressing applied by PA-C

## 2018-09-18 NOTE — ED Triage Notes (Signed)
Pt to ED after hitting right arm on a car. Significant skin tear noted to right forearm. Bleeding under control at this time.

## 2018-09-18 NOTE — ED Provider Notes (Signed)
[facility] Emergency Department Provider Note  ____________________________________________  Time seen: Approximately 7:31 PM  I have reviewed the triage vital signs and the nursing notes.   HISTORY  Chief Complaint skin tear    HPI [name] [name] is a 85 y.o. male presents to the emergency department with a large skin tear along the dorsal aspect of the right upper extremity.  Skin tear is approximately 24 cm in length and 3 cm at its widest point.  Patient states that he was trying to open a car door to get a towel out so he could sit in the sun when he bumped arm against car.  He did not fall.  Denies head or neck injury.  Patient lives independently at home.  His daughter stays with him at night.  He denies headache, chest pain, chest tightness, shortness of breath, nausea, vomiting or abdominal pain.  He has been able to ambulate without difficulty.  No other alleviating measures have been attempted.        Past Medical History:  Diagnosis Date  . AICD (automatic cardioverter/defibrillator) present    PACEMAKER  . Anemia   . Cancer ([place])   . Cardiac pacemaker in situ 07/2005   symptomatic bradycardia  . CKD (chronic kidney disease) stage 3, GFR 30-59 ml/min ([name]) 04/13/2017  . Diverticulosis of colon (without mention of hemorrhage)   . Dysrhythmia   . 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 GERD (gastroesophageal reflux disease)   . GI hemorrhage   . Hyperlipidemia   . Hypertension   . Lower extremity edema   . Neck arthritis, Severe, multi-level 06/09/2013  . Paroxysmal atrial fibrillation ([name])    s/p failed ablation  . Peptic ulcer, unspecified site, unspecified as acute or chronic, without mention of hemorrhage, perforation, or obstruction 1975  . Personal history of malignant neoplasm of prostate 12/2000   5 weeks radiation, radiation seeds  . Radiation proctitis   .  Sinoatrial node dysfunction ([name])   . Unspecified glaucoma(365.9)   . Unspecified hypothyroidism   . Vitamin B12 deficiency     Patient Active Problem List   Diagnosis Date Noted  . CKD (chronic kidney disease) stage 3, GFR 30-59 ml/min ([name]) 04/13/2017  . DDD (degenerative disc disease), cervical, Severe 06/09/2013  . Neck arthritis, Severe, multi-level 06/09/2013  . Sinoatrial node dysfunction ([name])   . Long term (current) use of anticoagulants 06/24/2010  . IRON DEFICIENCY 04/17/2010  . GLAUCOMA 08/07/2008  . GERD 08/07/2008  . PACEMAKER, PERMANENT 08/07/2008  . Vitamin B 12 deficiency 07/19/2008  . PEPTIC ULCER DISEASE 07/18/2008  . RADIATION PROCTITIS 07/18/2008  . DIVERTICULOSIS, COLON 07/18/2008  . Prostate cancer, in remission 07/18/2008  . Essential hypertension 05/09/2008  . Hyperlipidemia 02/06/2008  . PAROXYSMAL ATRIAL FIBRILLATION 02/06/2008  . Hypothyroidism 12/01/2007  . HERN UNS SITE ABD CAV W/O MENTION OBST/GANGREN 12/01/2007    Past Surgical History:  Procedure Laterality Date  . CARDIAC CATHETERIZATION    . CARDIAC ELECTROPHYSIOLOGY [place] AND ABLATION  2001, 2003   failure  . CATARACT EXTRACTION    . CYSTOSCOPY WITH URETHRAL DILATATION N/A 01/29/2017   Procedure: CYSTOSCOPY WITH URETHRAL DILATATION;  Surgeon: Abbie Sons, MD;  Location: [facility] ORS;  Service: Urology;  Laterality: N/A;  . EP IMPLANTABLE DEVICE N/A 02/24/2016   Procedure: PPM Generator Changeout;  Surgeon: Deboraha Sprang, MD;  Location: [place] [facility];  Service:  Cardiovascular;  Laterality: N/A;  . gastric ulcer surgery    . HERNIA REPAIR  1994, 2001, 2003   s/p drainage and complications, 03/7614, 0/7371 mesh removal  . INSERT / REPLACE / REMOVE PACEMAKER  07/2005   symptomatic bradycardia  . kidney stones    . NOSE SURGERY     cancer removed   . PROSTATE SURGERY    . SKIN CANCER DESTRUCTION    . TOOTH EXTRACTION      Prior to Admission medications   Medication Sig Start  Date End Date Taking? Authorizing Provider  acetaminophen (TYLENOL) 500 MG tablet Take 500 mg by mouth every 6 (six) hours as needed for moderate pain or headache.    [provider]  alendronate (FOSAMAX) 70 MG tablet TAKE 1 TABLET BY MOUTH EVERY 7 DAYS WITH A FULL GLASS OF WATER ON AN EMPTY STOMACH Patient taking differently: Take 70 mg by mouth once a week.  06/08/18   Copland, Frederico Hamman, MD  amiodarone (PACERONE) 200 MG tablet TAKE 1/2 TABLET BY MOUTH DAILY Patient taking differently: Take 100 mg by mouth at bedtime.  06/08/18   Deboraha Sprang, MD  cefUROXime (CEFTIN) 250 MG tablet Take 1 tablet (250 mg total) by mouth 2 (two) times daily with a meal for 14 days. 09/09/18 09/23/18  Stoioff, Ronda Fairly, MD  clindamycin (CLEOCIN) 300 MG capsule Take 600 mg by mouth See admin instructions. Take 600 mg by mouth 1 hour prior to dental procedure    [provider]  Cyanocobalamin (VITAMIN B-12 IJ) Inject 1,000 mcg as directed every 30 (thirty) days.     [provider]  docusate sodium (COLACE) 50 MG capsule Take 50 mg by mouth 2 (two) times daily as needed for mild constipation.    [provider]  dorzolamide-timolol (COSOPT) 22.3-6.8 MG/ML ophthalmic solution Place 1 drop into both eyes 2 (two) times daily.      [provider]  ferrous sulfate 325 (65 FE) MG EC tablet Take 325 mg by mouth daily with breakfast.      [provider]  furosemide (LASIX) 20 MG tablet Take 2 tablets (40 mg) by mouth once every other day as directed 04/12/18   Copland, Frederico Hamman, MD  levothyroxine (SYNTHROID) 100 MCG tablet TAKE 1 TABLET BY MOUTH DAILY BEFORE BREAKFAST Patient taking differently: Take 100 mcg by mouth daily before breakfast. TAKE 1 TABLET BY MOUTH DAILY BEFORE BREAKFAST 08/17/18   Copland, Frederico Hamman, MD  loperamide (IMODIUM) 2 MG capsule Take 2 mg by mouth as needed for diarrhea or loose stools.    [provider]  Multiple Vitamin (MULTIVITAMIN) tablet  Take 1 tablet by mouth daily.      [provider]  pantoprazole (PROTONIX) 40 MG tablet TAKE 1 TABLET(40 MG) BY MOUTH DAILY Patient taking differently: Take 40 mg by mouth every morning.  04/01/18   Copland, Frederico Hamman, MD  polyethylene glycol (MIRALAX / GLYCOLAX) packet Take 17 g by mouth daily as needed for moderate constipation.     [provider]  pravastatin (PRAVACHOL) 40 MG tablet TAKE ONE-HALF TABLET BY MOUTH DAILY Patient taking differently: Take 20 mg by mouth at bedtime.  04/11/18   Copland, Frederico Hamman, MD  simethicone (MYLICON) 062 MG chewable tablet Chew 125 mg by mouth every 6 (six) hours as needed for flatulence.    [provider]  tamsulosin (FLOMAX) 0.4 MG CAPS capsule TAKE 1 CAPSULE(0.4 MG) BY MOUTH DAILY Patient taking differently: Take 0.4 mg by mouth daily after  lunch.  04/11/18   Zara Council A, PA-C  warfarin (COUMADIN) 1 MG tablet TAKE 1 1/2 TABLETS BY MOUTH DAILY AS DIRECTED Patient taking differently: Take 1.5 mg by mouth daily.  09/05/18   Deboraha Sprang, MD  Wheat Dextrin (BENEFIBER DRINK MIX PO) Take 1 scoop by mouth daily.     [provider]    Allergies Penicillins  Family History  Problem Relation Age of Onset  . Breast cancer Mother   . Bone cancer Father   . Alcohol abuse Other   . Coronary artery disease Other   . Dementia Other   . Diabetes Other     Social History Social History   Tobacco Use  . Smoking status: Never Smoker  . Smokeless tobacco: Never Used  Substance Use Topics  . Alcohol use: No  . Drug use: No     Review of Systems  Constitutional: No fever/chills Eyes: No visual changes. No discharge ENT: No upper respiratory complaints. Cardiovascular: no chest pain. Respiratory: no cough. No SOB. Gastrointestinal: No abdominal pain.  No nausea, no vomiting.  No diarrhea.  No constipation. Musculoskeletal: Negative for musculoskeletal pain. Skin: Patient has large skin tear of right forearm.   Neurological: Negative for headaches, focal weakness or numbness.   ____________________________________________   PHYSICAL EXAM:  VITAL SIGNS: ED Triage Vitals  Enc Vitals Group     BP 09/18/18 1556 120/77     Pulse Rate 09/18/18 1556 73     Resp 09/18/18 1556 16     Temp 09/18/18 1556 98.9 F (37.2 C)     Temp Source 09/18/18 1556 Oral     SpO2 09/18/18 1556 99 %     Weight 09/18/18 1557 131 lb (59.4 kg)     Height 09/18/18 1557 5\' 9"  (1.753 m)     Head Circumference --      Peak Flow --      Pain Score 09/18/18 1557 2     Pain Loc --      Pain Edu? --      Excl. in [place]? --      Constitutional: Alert and oriented. Well appearing and in no acute distress. Eyes: Conjunctivae are normal. PERRL. EOMI. Head: Atraumatic.  Cardiovascular: Normal rate, regular rhythm. Normal S1 and S2.  Good peripheral circulation. Respiratory: Normal respiratory effort without tachypnea or retractions. Lungs CTAB. Good air entry to the bases with no decreased or absent breath sounds. Musculoskeletal: Full range of motion to all extremities. No gross deformities appreciated. Neurologic:  Normal speech and language. No gross focal neurologic deficits are appreciated.  Skin: Patient has large skin tear of right forearm.  Skin tear is 24 cm long and approximately 3 cm in width. Psychiatric: Mood and affect are normal. Speech and behavior are normal. Patient exhibits appropriate insight and judgement.   ____________________________________________   LABS (all labs ordered are listed, but only abnormal results are displayed)  Labs Reviewed - No data to display ____________________________________________  EKG   ____________________________________________  RADIOLOGY   No results found.  ____________________________________________    PROCEDURES  Procedure(s) performed:    Procedures  LACERATION REPAIR Performed by: Lannie Fields Authorized by: Lannie Fields Consent:  Verbal consent obtained. Risks and benefits: risks, benefits and alternatives were discussed Consent given by: patient Patient identity confirmed: provided demographic data Prepped and Draped in normal sterile fashion Wound explored  Laceration Location: Right forearm   Laceration Length: 28 cm  No Foreign Bodies seen or palpated  Anesthesia: local infiltration  Local anesthetic: lidocaine 1% with 1% epinephrine  Anesthetic total: 20 ml  Irrigation method: syringe Amount of cleaning: standard  Skin closure: 4-0 Ethilon   Number of sutures: 28  Technique: Simple Interrupted and dermabond  Patient tolerance: Patient tolerated the procedure well with no immediate complications.   Medications  lidocaine-EPINEPHrine (XYLOCAINE W/EPI) 2 %-1:100000 (with pres) injection 20 mL (1 mL Infiltration Given 09/18/18 1922)     ____________________________________________   INITIAL IMPRESSION / ASSESSMENT AND PLAN / ED COURSE  Pertinent labs & imaging results that were available during my care of the patient were reviewed by me and considered in my medical decision making (see chart for details).  Review of the [place] CSRS was performed in accordance of the [place] prior to dispensing any controlled drugs.           Assessment and Plan:  Skin Tear:  85 year old male presents to the emergency department with a large skin tear along the dorsal aspect of the right forearm repaired in the emergency department with suture and Dermabond.  Tetanus status is already up-to-date.  Patient is already taking Ceftin.  Advised patient to continue taking Ceftin as directed by urologist. Advised patient to have external sutures removed in seven days.  All patient questions were answered.   ____________________________________________  FINAL CLINICAL IMPRESSION(S) / ED DIAGNOSES  Final diagnoses:  Skin tear of forearm without complication, initial encounter      NEW MEDICATIONS STARTED DURING  THIS VISIT:  ED Discharge Orders    None          This chart was dictated using voice recognition software/Dragon. Despite best efforts to proofread, errors can occur which can change the meaning. Any change was purely unintentional.    Lannie Fields, PA-C 09/18/18 1939    Schuyler Amor, MD 09/18/18 2241

## 2018-09-18 NOTE — Discharge Instructions (Signed)
Continue taking Ceftin.  Keep wound clean and dry for 24 hours.  Dressing can be changed daily after 24 hours.  Have sutures removed in seven days by primary care provider.  You have a referral to wound care to be used if poor wound healing occurs.

## 2018-09-19 ENCOUNTER — Other Ambulatory Visit: Payer: Self-pay | Admitting: Radiology

## 2018-09-19 ENCOUNTER — Telehealth: Payer: Self-pay | Admitting: Radiology

## 2018-09-19 DIAGNOSIS — N35912 Unspecified bulbous urethral stricture, male: Secondary | ICD-10-CM

## 2018-09-19 DIAGNOSIS — E876 Hypokalemia: Secondary | ICD-10-CM

## 2018-09-19 MED ORDER — POTASSIUM CHLORIDE ER 10 MEQ PO TBCR: 10.0000 meq | EXTENDED_RELEASE_TABLET | Freq: Every day | ORAL | 0 refills | Status: DC

## 2018-09-19 NOTE — Telephone Encounter (Signed)
-----   Message from Abbie Sons, MD sent at 09/19/2018  8:19 AM EDT ----- Please start potassium chloride 10 meq daily

## 2018-09-19 NOTE — Telephone Encounter (Signed)
Patient called to confirm receipt of message and understanding of call.

## 2018-09-19 NOTE — Telephone Encounter (Signed)
LMOM to notify patient of script sent to pharmacy.

## 2018-09-20 ENCOUNTER — Encounter: Payer: Self-pay | Admitting: Anesthesiology

## 2018-09-20 MED ORDER — CIPROFLOXACIN IN D5W 400 MG/200ML IV SOLN
400.0000 mg | INTRAVENOUS | Status: AC
  Administered 2018-09-21: 400 mg via INTRAVENOUS

## 2018-09-21 ENCOUNTER — Encounter
Admission: RE | Disposition: A | Discharge status: Home / Self Care | Payer: Self-pay | Source: Home / Self Care | Attending: Urology

## 2018-09-21 ENCOUNTER — Ambulatory Visit: Payer: Medicare Other | Admitting: Anesthesiology

## 2018-09-21 ENCOUNTER — Other Ambulatory Visit: Payer: Self-pay

## 2018-09-21 ENCOUNTER — Encounter: Payer: Self-pay | Admitting: Emergency Medicine

## 2018-09-21 ENCOUNTER — Ambulatory Visit
Admission: RE | Admit: 2018-09-21 | Discharge: 2018-09-21 | Disposition: A | Discharge status: Home / Self Care | Payer: Medicare Other | Attending: Urology | Admitting: Urology

## 2018-09-21 ENCOUNTER — Ambulatory Visit: Payer: Medicare Other

## 2018-09-21 ENCOUNTER — Telehealth: Payer: Self-pay | Admitting: Urology

## 2018-09-21 DIAGNOSIS — Z880 Allergy status to penicillin: Secondary | ICD-10-CM | POA: Insufficient documentation

## 2018-09-21 DIAGNOSIS — K219 Gastro-esophageal reflux disease without esophagitis: Secondary | ICD-10-CM | POA: Diagnosis not present

## 2018-09-21 DIAGNOSIS — R3911 Hesitancy of micturition: Secondary | ICD-10-CM | POA: Diagnosis not present

## 2018-09-21 DIAGNOSIS — Z923 Personal history of irradiation: Secondary | ICD-10-CM | POA: Insufficient documentation

## 2018-09-21 DIAGNOSIS — Z950 Presence of cardiac pacemaker: Secondary | ICD-10-CM | POA: Diagnosis not present

## 2018-09-21 DIAGNOSIS — Z8711 Personal history of peptic ulcer disease: Secondary | ICD-10-CM | POA: Diagnosis not present

## 2018-09-21 DIAGNOSIS — I4510 Unspecified right bundle-branch block: Secondary | ICD-10-CM | POA: Insufficient documentation

## 2018-09-21 DIAGNOSIS — R600 Localized edema: Secondary | ICD-10-CM | POA: Diagnosis not present

## 2018-09-21 DIAGNOSIS — H409 Unspecified glaucoma: Secondary | ICD-10-CM | POA: Insufficient documentation

## 2018-09-21 DIAGNOSIS — E039 Hypothyroidism, unspecified: Secondary | ICD-10-CM | POA: Diagnosis not present

## 2018-09-21 DIAGNOSIS — D631 Anemia in chronic kidney disease: Secondary | ICD-10-CM | POA: Insufficient documentation

## 2018-09-21 DIAGNOSIS — Z8589 Personal history of malignant neoplasm of other organs and systems: Secondary | ICD-10-CM | POA: Insufficient documentation

## 2018-09-21 DIAGNOSIS — R001 Bradycardia, unspecified: Secondary | ICD-10-CM | POA: Diagnosis not present

## 2018-09-21 DIAGNOSIS — N35912 Unspecified bulbous urethral stricture, male: Secondary | ICD-10-CM | POA: Insufficient documentation

## 2018-09-21 DIAGNOSIS — Z79899 Other long term (current) drug therapy: Secondary | ICD-10-CM | POA: Insufficient documentation

## 2018-09-21 DIAGNOSIS — Z8249 Family history of ischemic heart disease and other diseases of the circulatory system: Secondary | ICD-10-CM | POA: Insufficient documentation

## 2018-09-21 DIAGNOSIS — Z7901 Long term (current) use of anticoagulants: Secondary | ICD-10-CM | POA: Insufficient documentation

## 2018-09-21 DIAGNOSIS — N183 Chronic kidney disease, stage 3 (moderate): Secondary | ICD-10-CM | POA: Diagnosis not present

## 2018-09-21 DIAGNOSIS — I129 Hypertensive chronic kidney disease with stage 1 through stage 4 chronic kidney disease, or unspecified chronic kidney disease: Secondary | ICD-10-CM | POA: Diagnosis not present

## 2018-09-21 DIAGNOSIS — Z820 Family history of epilepsy and other diseases of the nervous system: Secondary | ICD-10-CM | POA: Insufficient documentation

## 2018-09-21 DIAGNOSIS — E785 Hyperlipidemia, unspecified: Secondary | ICD-10-CM | POA: Diagnosis not present

## 2018-09-21 DIAGNOSIS — I480 Paroxysmal atrial fibrillation: Secondary | ICD-10-CM | POA: Diagnosis not present

## 2018-09-21 DIAGNOSIS — Z808 Family history of malignant neoplasm of other organs or systems: Secondary | ICD-10-CM | POA: Insufficient documentation

## 2018-09-21 DIAGNOSIS — M1990 Unspecified osteoarthritis, unspecified site: Secondary | ICD-10-CM | POA: Diagnosis not present

## 2018-09-21 DIAGNOSIS — Z8546 Personal history of malignant neoplasm of prostate: Secondary | ICD-10-CM | POA: Insufficient documentation

## 2018-09-21 DIAGNOSIS — Z85828 Personal history of other malignant neoplasm of skin: Secondary | ICD-10-CM | POA: Insufficient documentation

## 2018-09-21 DIAGNOSIS — Z9849 Cataract extraction status, unspecified eye: Secondary | ICD-10-CM | POA: Diagnosis not present

## 2018-09-21 DIAGNOSIS — N35919 Unspecified urethral stricture, male, unspecified site: Secondary | ICD-10-CM | POA: Diagnosis not present

## 2018-09-21 DIAGNOSIS — Z811 Family history of alcohol abuse and dependence: Secondary | ICD-10-CM | POA: Insufficient documentation

## 2018-09-21 DIAGNOSIS — Z833 Family history of diabetes mellitus: Secondary | ICD-10-CM | POA: Insufficient documentation

## 2018-09-21 HISTORY — PX: CYSTOSCOPY WITH URETHRAL DILATATION: SHX5125

## 2018-09-21 HISTORY — PX: CYSTOGRAM: SHX6285

## 2018-09-21 LAB — POCT I-STAT 4, (NA,K, GLUC, HGB,HCT)
Glucose, Bld: 98 mg/dL (ref 70–99)
HCT: 39 % (ref 39.0–52.0)
Hemoglobin: 13.3 g/dL (ref 13.0–17.0)
Potassium: 3.3 mmol/L — ABNORMAL LOW (ref 3.5–5.1)
Sodium: 142 mmol/L (ref 135–145)

## 2018-09-21 SURGERY — CYSTOSCOPY, WITH URETHRAL DILATION
Anesthesia: General | Site: Urethra

## 2018-09-21 MED ORDER — FENTANYL CITRATE (PF) 100 MCG/2ML IJ SOLN
INTRAMUSCULAR | Status: AC
  Filled 2018-09-21: qty 2

## 2018-09-21 MED ORDER — IODIXANOL 320 MG/ML IV SOLN
INTRAVENOUS | Status: DC | PRN
  Administered 2018-09-21: 90 mL

## 2018-09-21 MED ORDER — ONDANSETRON HCL 4 MG/2ML IJ SOLN: 4.0000 mg | Freq: Once | INTRAMUSCULAR | Status: DC | PRN

## 2018-09-21 MED ORDER — ONDANSETRON HCL 4 MG/2ML IJ SOLN
INTRAMUSCULAR | Status: AC
  Filled 2018-09-21: qty 2

## 2018-09-21 MED ORDER — IOHEXOL 180 MG/ML  SOLN
INTRAMUSCULAR | Status: DC | PRN
  Administered 2018-09-21: 60 mL

## 2018-09-21 MED ORDER — PROPOFOL 10 MG/ML IV BOLUS
INTRAVENOUS | Status: AC
  Filled 2018-09-21: qty 20

## 2018-09-21 MED ORDER — FENTANYL CITRATE (PF) 100 MCG/2ML IJ SOLN
INTRAMUSCULAR | Status: DC | PRN
  Administered 2018-09-21 (×2): 25 ug via INTRAVENOUS

## 2018-09-21 MED ORDER — FENTANYL CITRATE (PF) 100 MCG/2ML IJ SOLN
INTRAMUSCULAR | Status: AC
  Administered 2018-09-21: 25 ug via INTRAVENOUS
  Filled 2018-09-21: qty 2

## 2018-09-21 MED ORDER — GLYCOPYRROLATE 0.2 MG/ML IJ SOLN
INTRAMUSCULAR | Status: AC
  Filled 2018-09-21: qty 1

## 2018-09-21 MED ORDER — LACTATED RINGERS IV SOLN
INTRAVENOUS | Status: DC
  Administered 2018-09-21: 10:00:00 via INTRAVENOUS

## 2018-09-21 MED ORDER — LIDOCAINE HCL (CARDIAC) PF 100 MG/5ML IV SOSY
PREFILLED_SYRINGE | INTRAVENOUS | Status: DC | PRN
  Administered 2018-09-21: 100 mg via INTRAVENOUS

## 2018-09-21 MED ORDER — PHENYLEPHRINE HCL (PRESSORS) 10 MG/ML IV SOLN
INTRAVENOUS | Status: DC | PRN
  Administered 2018-09-21 (×4): 200 ug via INTRAVENOUS

## 2018-09-21 MED ORDER — ONDANSETRON HCL 4 MG/2ML IJ SOLN
INTRAMUSCULAR | Status: DC | PRN
  Administered 2018-09-21: 4 mg via INTRAVENOUS

## 2018-09-21 MED ORDER — PROPOFOL 10 MG/ML IV BOLUS
INTRAVENOUS | Status: DC | PRN
  Administered 2018-09-21: 100 mg via INTRAVENOUS

## 2018-09-21 MED ORDER — CIPROFLOXACIN IN D5W 400 MG/200ML IV SOLN
INTRAVENOUS | Status: AC
  Filled 2018-09-21: qty 200

## 2018-09-21 MED ORDER — FENTANYL CITRATE (PF) 100 MCG/2ML IJ SOLN
25.0000 ug | INTRAMUSCULAR | Status: DC | PRN
  Administered 2018-09-21 (×2): 25 ug via INTRAVENOUS

## 2018-09-21 SURGICAL SUPPLY — 22 items
CATH FOL 2WAY LX 16X5 (CATHETERS) IMPLANT
CATH FOLEY 2W COUNCIL 20FR 5CC (CATHETERS) ×3 IMPLANT
CATH SET URETHRAL DILATOR (CATHETERS) ×3 IMPLANT
CATH URETL 5X70 OPEN END (CATHETERS) ×3 IMPLANT
COVER WAND RF STERILE (DRAPES) IMPLANT
DRAPE UTILITY 15X26 TOWEL STRL (DRAPES) ×3 IMPLANT
ELECT REM PT RETURN 9FT ADLT (ELECTROSURGICAL)
ELECTRODE REM PT RTRN 9FT ADLT (ELECTROSURGICAL) IMPLANT
GLOVE BIO SURGEON STRL SZ8 (GLOVE) ×3 IMPLANT
GLOVE BIOGEL M 8.0 STRL (GLOVE) ×3 IMPLANT
GOWN STRL REUS W/ TWL LRG LVL4 (GOWN DISPOSABLE) ×4 IMPLANT
GOWN STRL REUS W/ TWL XL LVL3 (GOWN DISPOSABLE) ×4 IMPLANT
GOWN STRL REUS W/TWL LRG LVL4 (GOWN DISPOSABLE) ×2
GOWN STRL REUS W/TWL XL LVL3 (GOWN DISPOSABLE) ×2
KIT TURNOVER CYSTO (KITS) ×3 IMPLANT
PACK CYSTO AR (MISCELLANEOUS) ×3 IMPLANT
SENSORWIRE 0.038 NOT ANGLED (WIRE) ×3
SET CYSTO W/LG BORE CLAMP LF (SET/KITS/TRAYS/PACK) ×3 IMPLANT
SYR 30ML LL (SYRINGE) IMPLANT
WATER STERILE IRR 1000ML POUR (IV SOLUTION) ×3 IMPLANT
WATER STERILE IRR 3000ML UROMA (IV SOLUTION) ×3 IMPLANT
WIRE SENSOR 0.038 NOT ANGLED (WIRE) ×2 IMPLANT

## 2018-09-21 NOTE — Anesthesia Procedure Notes (Signed)
Procedure Name: LMA Insertion Date/Time: 09/21/2018 11:05 AM Performed by: Lavone Orn, CRNA Pre-anesthesia Checklist: Patient identified, Emergency Drugs available, Suction available, Patient being monitored and Timeout performed Patient Re-evaluated:Patient Re-evaluated prior to induction Oxygen Delivery Method: Circle system utilized Preoxygenation: Pre-oxygenation with 100% oxygen Induction Type: IV induction Ventilation: Mask ventilation without difficulty LMA: LMA inserted LMA Size: 4.0 Number of attempts: 1 Placement Confirmation: positive ETCO2 and breath sounds checked- equal and bilateral Tube secured with: Tape Dental Injury: Teeth and Oropharynx as per pre-operative assessment

## 2018-09-21 NOTE — Interval H&P Note (Signed)
History and Physical Interval Note:  09/21/2018 10:41 AM  [name] [name]  has presented today for surgery, with the diagnosis of urethral stricture.  The various methods of treatment have been discussed with the patient and family. After consideration of risks, benefits and other options for treatment, the patient has consented to  Procedure(s): CYSTOSCOPY WITH URETHRAL DILATATION (N/A) CYSTOGRAM (N/A) as a surgical intervention.  The patient's history has been reviewed, patient examined, no change in status, stable for surgery.  I have reviewed the patient's chart and labs.  Questions were answered to the patient's satisfaction.     [place]

## 2018-09-21 NOTE — Transfer of Care (Signed)
Immediate Anesthesia Transfer of Care Note  Patient: [name] [name]  Procedure(s) Performed: CYSTOSCOPY WITH URETHRAL DILATATION (N/A Urethra) CYSTOGRAM (N/A Bladder)  Patient Location: PACU  Anesthesia Type:General  Level of Consciousness: drowsy  Airway & Oxygen Therapy: Patient Spontanous Breathing and Patient connected to face mask oxygen  Post-op Assessment: Report given to RN and Post -op Vital signs reviewed and stable  Post vital signs: stable  Last Vitals:  Vitals Value Taken Time  BP 124/79 09/21/18 1157  Temp 36.3 C 09/21/18 1157  Pulse 60 09/21/18 1203  Resp 24 09/21/18 1203  SpO2 100 % 09/21/18 1203  Vitals shown include unvalidated device data.  Last Pain:  Vitals:   09/21/18 1157  TempSrc:   PainSc: Asleep         Complications: No apparent anesthesia complications

## 2018-09-21 NOTE — Anesthesia Post-op Follow-up Note (Signed)
Anesthesia QCDR form completed.        

## 2018-09-21 NOTE — Telephone Encounter (Signed)
APP MADE PATIENT NOTIFIED  [name] [name]

## 2018-09-21 NOTE — Anesthesia Postprocedure Evaluation (Signed)
Anesthesia Post Note  Patient: [name] [name]  Procedure(s) Performed: CYSTOSCOPY WITH URETHRAL DILATATION (N/A Urethra) CYSTOGRAM (N/A Bladder)  Patient location during evaluation: PACU Anesthesia Type: General Level of consciousness: awake and alert Pain management: pain level controlled Vital Signs Assessment: post-procedure vital signs reviewed and stable Respiratory status: spontaneous breathing, nonlabored ventilation, respiratory function stable and patient connected to nasal cannula oxygen Cardiovascular status: blood pressure returned to baseline and stable Postop Assessment: no apparent nausea or vomiting Anesthetic complications: no     Last Vitals:  Vitals:   09/21/18 1254 09/21/18 1328  BP: 110/68 116/69  Pulse: 63 60  Resp: 16 16  Temp: (!) 35.9 C (!) 36 C  SpO2: 98% 100%    Last Pain:  Vitals:   09/21/18 1328  TempSrc: Temporal  PainSc: 0-No pain                 Rayola Everhart S

## 2018-09-21 NOTE — Discharge Instructions (Addendum)
DISCHARGE INSTRUCTIONS  MEDICATIONS:  1. Resume all your other meds from home.   ACTIVITY:  1. May resume regular activities in 24 hours. 2 Drink plenty of water  3. Continue to walk at home - you can still get blood clots when you are at home, so keep active, but don't over do it.    BATHING:  1. You can shower.   SIGNS/SYMPTOMS TO CALL:  Please call us if you have a fever greater than 101.5, uncontrolled nausea/vomiting, uncontrolled pain, dizziness, unable to urinate, excessively bloody urine, catheter drainage problems, chest pain, shortness of breath, leg swelling, leg pain, or any other concerns or questions.   You can reach [place] at [phone].   FOLLOW-UP:  1. You will be contacted for an appointment for catheter removal in approximately 1 week.    AMBULATORY SURGERY  DISCHARGE INSTRUCTIONS   1) The drugs that you were given will stay in your system until tomorrow so for the next 24 hours you should not:  A) Drive an automobile B) Make any legal decisions C) Drink any alcoholic beverage   2) You may resume regular meals tomorrow.  Today it is better to start with liquids and gradually work up to solid foods.  You may eat anything you prefer, but it is better to start with liquids, then soup and crackers, and gradually work up to solid foods.   3) Please notify your doctor immediately if you have any unusual bleeding, trouble breathing, redness and pain at the surgery site, drainage, fever, or pain not relieved by medication.    4) Additional Instructions:        Please contact your physician with any problems or Same Day Surgery at [phone], Monday through Friday 6 am to 4 pm, or [place] at [facility] number at [phone].

## 2018-09-21 NOTE — Anesthesia Preprocedure Evaluation (Signed)
Anesthesia Evaluation  Patient identified by MRN, date of birth, ID band Patient awake    Reviewed: Allergy & Precautions, NPO status , Patient's Chart, lab work & pertinent test results, reviewed documented beta blocker date and time   Airway Mallampati: II  TM Distance: >3 FB     Dental  (+) Chipped   Pulmonary           Cardiovascular hypertension, + dysrhythmias Atrial Fibrillation + Cardiac Defibrillator      Neuro/Psych    GI/Hepatic PUD, GERD  Controlled,  Endo/Other  Hypothyroidism   Renal/GU Renal disease     Musculoskeletal  (+) Arthritis ,   Abdominal   Peds  Hematology  (+) anemia ,   Anesthesia Other Findings   Reproductive/Obstetrics                             Anesthesia Physical Anesthesia Plan  ASA: II  Anesthesia Plan: General   Post-op Pain Management:    Induction: Intravenous  PONV Risk Score and Plan:   Airway Management Planned: LMA  Additional Equipment:   Intra-op Plan:   Post-operative Plan:   Informed Consent: I have reviewed the patients History and Physical, chart, labs and discussed the procedure including the risks, benefits and alternatives for the proposed anesthesia with the patient or authorized representative who has indicated his/her understanding and acceptance.       Plan Discussed with: CRNA  Anesthesia Plan Comments:         Anesthesia Quick Evaluation

## 2018-09-21 NOTE — Telephone Encounter (Signed)
-----   Message from Abbie Sons, MD sent at 09/21/2018 12:43 PM EDT ----- Regarding: Follow-up Please schedule nurse visit/catheter removal 1 week

## 2018-09-21 NOTE — Op Note (Signed)
Preoperative diagnosis:  1. Bulbous urethral stricture  Postoperative diagnosis:  1. Bulbous urethral stricture  Procedure: 1. Cystoscopy with urethral dilation 2. Cystogram  Surgeon: Abbie Sons, MD  Anesthesia: General  Complications: None  Intraoperative findings:  1.  Dense bulbar urethral stricture-almost obliterative 2.  No evidence of vesicocutaneous fistula on cystogram  EBL: Minimal  Specimens: None  Indication: [name] [name] is a 85 y.o. patient with a history of prostate cancer status post radiation with recurrent bulbar urethral stricture.  He recently noted worsening lower urinary tract symptoms.  He also has a history of eroded hernia mesh in the bladder and has had drainage of clearish fluid from an old hernia scar.  Cystoscopy showed a small caliber bulbar urethral stricture.  After reviewing the management options for treatment, he elected to proceed with the above surgical procedure(s). We have discussed the potential benefits and risks of the procedure, side effects of the proposed treatment, the likelihood of the patient achieving the goals of the procedure, and any potential problems that might occur during the procedure or recuperation. Informed consent has been obtained.  Description of procedure:  The patient was taken to the operating room and general anesthesia was induced.  The patient was placed in the dorsal lithotomy position, prepped and draped in the usual sterile fashion, and preoperative antibiotics were administered. A preoperative time-out was performed.   A 22 French cystoscope was lubricated and passed per urethra.  In the proximal bulbar urethra a small urethral stricture was noted with a thin web of tissue over the opening.  This area was probed with a 0.038 sensor wire however would not advance proximally.  A 5 French open-ended ureteral catheter was placed through the cystoscope up to the point of the stricture.  The guidewire was then  advanced through the ureteral catheter and appeared to advance proximally and without resistance.  The ureteral catheter was then advanced over the wire.  The guidewire was removed and clear urine was aspirated from the catheter.  The guidewire was replaced and the ureteral catheter was removed.  Over-the-wire urethral dilators were then advanced from [name].  The cystoscope was then repassed.  The dilated stricture was without bleeding.  The prostate was nonocclusive.  The bladder mucosa was closely inspected.  There was mild mucosal erythema.  No solid or papillary lesions were notified.  At the very anterior aspect a small amount of calcification was noted consistent with his history of eroded hernia mesh which was unable to be completely removed endoscopically.  Cystogram was then performed with approximately 400 mL of contrast and no evidence of fistula was noted.  The cystoscope was removed.  A 20 [place] [employer] catheter was placed over the guidewire and placed to gravity drainage.  After anesthetic reversal the patient was transported to the PACU in stable condition.   Abbie Sons, M.D.

## 2018-09-26 ENCOUNTER — Telehealth: Payer: Self-pay

## 2018-09-26 ENCOUNTER — Ambulatory Visit (INDEPENDENT_AMBULATORY_CARE_PROVIDER_SITE_OTHER): Payer: Medicare Other | Admitting: Family Medicine

## 2018-09-26 ENCOUNTER — Encounter: Payer: Self-pay | Admitting: Family Medicine

## 2018-09-26 ENCOUNTER — Other Ambulatory Visit: Payer: Self-pay

## 2018-09-26 VITALS — BP 130/80 | HR 80 | Temp 97.3°F | Ht 69.5 in | Wt 134.2 lb

## 2018-09-26 DIAGNOSIS — Z4802 Encounter for removal of sutures: Secondary | ICD-10-CM

## 2018-09-26 NOTE — Telephone Encounter (Signed)
Attempted to contact pt to prescreen for COVID19 prior to appt for INR check on Wed, 7/1. Left message on his vm advising him of his appt time and that he will need to enter thru the [place] entrance of [facility] and wear a mask.  Asked him to call back w/ any questions or concerns.

## 2018-09-26 NOTE — Progress Notes (Signed)
     Biridiana Twardowski T. Emmaleigh Longo, MD Primary Care and [place] at [facility] [place], [address] Phone: [phone]  FAX: [phone]  JERONE CUDMORE - 85 y.o. male  MRN [id number]  Date of Birth: [birth year]  Visit Date: 09/26/2018  PCP: Owens Loffler, MD  Referred by: Owens Loffler, MD  Chief Complaint  Patient presents with  . Suture / Staple Removal   Subjective:   MERIL DRAY is a 85 y.o. very pleasant male patient who presents with the following:  Suture and staple removal by non-operating MD: #28 sutures of the forearm.  8 days ago.  Not ready to come out, f/u 1 week.  N/c  Signed,  Frederico Hamman T. Brandell Maready, MD

## 2018-09-28 ENCOUNTER — Ambulatory Visit (INDEPENDENT_AMBULATORY_CARE_PROVIDER_SITE_OTHER): Payer: Medicare Other

## 2018-09-28 ENCOUNTER — Other Ambulatory Visit: Payer: Self-pay

## 2018-09-28 DIAGNOSIS — Z5181 Encounter for therapeutic drug level monitoring: Secondary | ICD-10-CM

## 2018-09-28 DIAGNOSIS — I480 Paroxysmal atrial fibrillation: Secondary | ICD-10-CM

## 2018-09-28 DIAGNOSIS — Z9889 Other specified postprocedural states: Secondary | ICD-10-CM

## 2018-09-28 LAB — POCT INR: INR: 2.8 (ref 2.0–3.0)

## 2018-09-28 NOTE — Progress Notes (Signed)
Catheter Removal  Patient is present today for a catheter removal.  55ml of water was drained from the balloon. A 22FR foley cath was removed from the bladder no complications were noted . Patient tolerated well. Pt advised to call back immediately in the event that he is unable to void. Pt advised on after hours line as well.   Preformed by: Gordy Clement, CMA   Follow up/ Additional notes: F/U as scheduled

## 2018-09-28 NOTE — Patient Instructions (Signed)
Please continue dosage of 1.5 tablets EVERY DAY. Recheck in 6 weeks.

## 2018-10-02 NOTE — Progress Notes (Signed)
     [name] [name] T. [name], MD Primary [facility] and [facility] at [facility] [place], [address] Phone: [phone]  FAX: [phone]  BRISTOL SOY - 85 y.o. male  MRN [id number]  Date of Birth: 1922/09/26  Visit Date: 10/03/2018  PCP: Owens Loffler, MD  Referred by: Owens Loffler, MD  Chief Complaint  Patient presents with  . Suture / Staple Removal    F/u suture removal only:  Removed #24 stiches with suture kit.  Steri-strips in place.  Signed,  [name]. Kien Mirsky, MD

## 2018-10-03 ENCOUNTER — Other Ambulatory Visit: Payer: Self-pay | Admitting: Family Medicine

## 2018-10-03 ENCOUNTER — Other Ambulatory Visit: Payer: Self-pay

## 2018-10-03 ENCOUNTER — Encounter: Payer: Self-pay | Admitting: Family Medicine

## 2018-10-03 ENCOUNTER — Ambulatory Visit (INDEPENDENT_AMBULATORY_CARE_PROVIDER_SITE_OTHER): Payer: Medicare Other | Admitting: Family Medicine

## 2018-10-03 VITALS — BP 120/80 | HR 88 | Temp 98.1°F | Ht 69.5 in | Wt 134.0 lb

## 2018-10-03 DIAGNOSIS — Z4802 Encounter for removal of sutures: Secondary | ICD-10-CM

## 2018-10-17 ENCOUNTER — Telehealth: Payer: Self-pay | Admitting: *Deleted

## 2018-10-17 ENCOUNTER — Ambulatory Visit: Payer: Medicare Other | Admitting: Urology

## 2018-10-17 NOTE — Telephone Encounter (Signed)
Left message for patient to call back. Scheduled for nurse visit Tuesday. When patient calls back  Covid screening needs to be done and curbside information given to him.

## 2018-10-18 ENCOUNTER — Ambulatory Visit (INDEPENDENT_AMBULATORY_CARE_PROVIDER_SITE_OTHER): Payer: Medicare Other | Admitting: *Deleted

## 2018-10-18 DIAGNOSIS — E538 Deficiency of other specified B group vitamins: Secondary | ICD-10-CM | POA: Diagnosis not present

## 2018-10-18 MED ORDER — CYANOCOBALAMIN 1000 MCG/ML IJ SOLN
1000.0000 ug | Freq: Once | INTRAMUSCULAR | Status: AC
  Administered 2018-10-18: 1000 ug via INTRAMUSCULAR

## 2018-10-18 NOTE — Progress Notes (Signed)
Per orders of Dr. Diona Browner (for Dr Lorelei Pont), injection of B12 1050mcg/mL given by [facility]. Patient tolerated injection well.

## 2018-10-24 ENCOUNTER — Ambulatory Visit (INDEPENDENT_AMBULATORY_CARE_PROVIDER_SITE_OTHER): Payer: Medicare Other | Admitting: *Deleted

## 2018-10-24 DIAGNOSIS — I480 Paroxysmal atrial fibrillation: Secondary | ICD-10-CM

## 2018-10-24 DIAGNOSIS — I495 Sick sinus syndrome: Secondary | ICD-10-CM | POA: Diagnosis not present

## 2018-10-24 LAB — CUP PACEART REMOTE DEVICE CHECK
Battery Remaining Longevity: 91 mo
Battery Voltage: 3.01 V
Brady Statistic AP VP Percent: 1.43 %
Brady Statistic AP VS Percent: 90.87 %
Brady Statistic AS VP Percent: 1.25 %
Brady Statistic AS VS Percent: 6.46 %
Brady Statistic RA Percent Paced: 91.49 %
Brady Statistic RV Percent Paced: 2.82 %
Date Time Interrogation Session: 20200727125843
Implantable Lead Implant Date: 20070525
Implantable Lead Implant Date: 20070525
Implantable Lead Location: 753859
Implantable Lead Location: 753860
Implantable Lead Model: 5076
Implantable Lead Model: 5076
Implantable Pulse Generator Implant Date: 20171127
Lead Channel Impedance Value: 380 Ohm
Lead Channel Impedance Value: 380 Ohm
Lead Channel Impedance Value: 380 Ohm
Lead Channel Impedance Value: 437 Ohm
Lead Channel Pacing Threshold Amplitude: 0.75 V
Lead Channel Pacing Threshold Amplitude: 0.875 V
Lead Channel Pacing Threshold Pulse Width: 0.4 ms
Lead Channel Pacing Threshold Pulse Width: 0.4 ms
Lead Channel Sensing Intrinsic Amplitude: 0.75 mV
Lead Channel Sensing Intrinsic Amplitude: 0.75 mV
Lead Channel Sensing Intrinsic Amplitude: 1.25 mV
Lead Channel Sensing Intrinsic Amplitude: 1.25 mV
Lead Channel Setting Pacing Amplitude: 2 V
Lead Channel Setting Pacing Amplitude: 2.5 V
Lead Channel Setting Pacing Pulse Width: 0.4 ms
Lead Channel Setting Sensing Sensitivity: 0.6 mV

## 2018-11-09 ENCOUNTER — Other Ambulatory Visit: Payer: Self-pay

## 2018-11-09 ENCOUNTER — Ambulatory Visit (INDEPENDENT_AMBULATORY_CARE_PROVIDER_SITE_OTHER): Payer: Medicare Other

## 2018-11-09 DIAGNOSIS — Z5181 Encounter for therapeutic drug level monitoring: Secondary | ICD-10-CM | POA: Diagnosis not present

## 2018-11-09 DIAGNOSIS — I480 Paroxysmal atrial fibrillation: Secondary | ICD-10-CM

## 2018-11-09 LAB — POCT INR: INR: 4.6 — AB (ref 2.0–3.0)

## 2018-11-09 NOTE — Patient Instructions (Signed)
Please skip warfarin today and tomorrow, then resume dosage of 1.5 tablets EVERY DAY. Recheck in 3 weeks.

## 2018-11-10 NOTE — Progress Notes (Signed)
Remote pacemaker transmission.   

## 2018-11-16 ENCOUNTER — Other Ambulatory Visit: Payer: Self-pay | Admitting: Internal Medicine

## 2018-11-17 DIAGNOSIS — H353132 Nonexudative age-related macular degeneration, bilateral, intermediate dry stage: Secondary | ICD-10-CM | POA: Diagnosis not present

## 2018-11-21 ENCOUNTER — Ambulatory Visit (INDEPENDENT_AMBULATORY_CARE_PROVIDER_SITE_OTHER): Payer: Medicare Other | Admitting: Urology

## 2018-11-21 ENCOUNTER — Other Ambulatory Visit: Payer: Self-pay

## 2018-11-21 ENCOUNTER — Encounter: Payer: Self-pay | Admitting: Urology

## 2018-11-21 VITALS — BP 95/59 | HR 86 | Ht 69.0 in | Wt 135.0 lb

## 2018-11-21 DIAGNOSIS — N35912 Unspecified bulbous urethral stricture, male: Secondary | ICD-10-CM | POA: Diagnosis not present

## 2018-11-21 NOTE — Progress Notes (Signed)
In and Out Catheterization  Patient is present today for a I & O catheterization to insure passage with out difficulty in tract. Patient was cleaned and prepped in a sterile fashion with betadine and Lidocaine 2% jelly was instilled into the urethra.  A 14FR striaght cath was attempted with out success a coude cath was then inserted no complications were noted , 74ml of urine return was noted, urine was yellow in color.  Bladder was drained  And catheter was removed with out difficulty.    Preformed by: Fonnie Jarvis, CMA

## 2018-11-21 NOTE — Progress Notes (Signed)
11/21/2018 2:05 PM   [name] [name] Jun 02, 1922 [id number]  Referring provider: Owens Loffler, MD [address] [place],  [place] [address]  Chief Complaint  Patient presents with  . Follow-up    HPI: 85 y.o. male presents for postop follow-up.  He underwent cystoscopy with dilation of a bulbous urethral stricture on 09/21/2018.  His Foley catheter was removed on 09/28/2018.  He states he is voiding with an excellent stream and has no complaints.  Cystogram was performed to evaluate for a possible vesicocutaneous fistula which was negative.  He states the drainage from his prior hernia scar has resolved.  He has no complaints today.   PMH: Past Medical History:  Diagnosis Date  . AICD (automatic cardioverter/defibrillator) present    PACEMAKER  . Anemia   . Cancer ([place])   . Cardiac pacemaker in situ 07/2005   symptomatic bradycardia  . CKD (chronic kidney disease) stage 3, GFR 30-59 ml/min ([name]) 04/13/2017  . Diverticulosis of colon (without mention of hemorrhage)   . Dysrhythmia   . 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 GERD (gastroesophageal reflux disease)   . GI hemorrhage   . Hyperlipidemia   . Hypertension   . Lower extremity edema   . Neck arthritis, Severe, multi-level 06/09/2013  . Paroxysmal atrial fibrillation ([name])    s/p failed ablation  . Peptic ulcer, unspecified site, unspecified as acute or chronic, without mention of hemorrhage, perforation, or obstruction 1975  . Personal history of malignant neoplasm of prostate 12/2000   5 weeks radiation, radiation seeds  . Radiation proctitis   . Sinoatrial node dysfunction ([name])   . Unspecified glaucoma(365.9)   . Unspecified hypothyroidism   . Vitamin B12 deficiency     Surgical History: Past Surgical History:  Procedure Laterality Date  . CARDIAC CATHETERIZATION    . CARDIAC ELECTROPHYSIOLOGY [place] AND ABLATION  2001, 2003   failure  . CATARACT EXTRACTION    . CYSTOGRAM N/A 09/21/2018   Procedure: CYSTOGRAM;  Surgeon: Abbie Sons, MD;  Location: [facility] ORS;  Service: Urology;  Laterality: N/A;  . CYSTOSCOPY WITH URETHRAL DILATATION N/A 01/29/2017   Procedure: CYSTOSCOPY WITH URETHRAL DILATATION;  Surgeon: Abbie Sons, MD;  Location: [facility] ORS;  Service: Urology;  Laterality: N/A;  . CYSTOSCOPY WITH URETHRAL DILATATION N/A 09/21/2018   Procedure: CYSTOSCOPY WITH URETHRAL DILATATION;  Surgeon: Abbie Sons, MD;  Location: [facility] ORS;  Service: Urology;  Laterality: N/A;  . EP IMPLANTABLE DEVICE N/A 02/24/2016   Procedure: PPM Generator Changeout;  Surgeon: Deboraha Sprang, MD;  Location: [facility];  Service: Cardiovascular;  Laterality: N/A;  . gastric ulcer surgery    . HERNIA REPAIR  1994, 2001, 2003   s/p drainage and complications, 10/8414, 08/628 mesh removal  . INSERT / REPLACE / REMOVE PACEMAKER  07/2005   symptomatic bradycardia  . kidney stones    . NOSE SURGERY     cancer removed   . PROSTATE SURGERY    . SKIN CANCER DESTRUCTION    . TOOTH EXTRACTION      Home Medications:  Allergies as of 11/21/2018      Reactions   Penicillins Rash, Other (See Comments)   Has patient had a PCN reaction causing immediate rash, facial/tongue/throat swelling, SOB or lightheadedness with hypotension: {no Has patient had a PCN reaction causing severe rash involving mucus membranes or skin necrosis: {no Has patient  had a PCN reaction that required hospitalization {no Has patient had a PCN reaction occurring within the last 10 years: {no If all of the above answers are "NO", then may proceed with Cephalosporin use.      Medication List       Accurate as of November 21, 2018  2:05 PM. If you have any questions, ask your nurse or doctor.        acetaminophen 500 MG tablet Commonly known as: TYLENOL Take 500 mg by mouth every 6 (six) hours as needed for moderate pain or headache.   alendronate 70 MG  tablet Commonly known as: FOSAMAX TAKE 1 TABLET BY MOUTH EVERY 7 DAYS WITH A FULL GLASS OF WATER ON AN EMPTY STOMACH What changed: See the new instructions.   amiodarone 200 MG tablet Commonly known as: PACERONE TAKE 1/2 TABLET BY MOUTH DAILY What changed: when to take this   BENEFIBER DRINK MIX PO Take 1 scoop by mouth daily.   clindamycin 300 MG capsule Commonly known as: CLEOCIN Take 600 mg by mouth See admin instructions. Take 600 mg by mouth 1 hour prior to dental procedure   docusate sodium 50 MG capsule Commonly known as: COLACE Take 50 mg by mouth 2 (two) times daily as needed for mild constipation.   dorzolamide-timolol 22.3-6.8 MG/ML ophthalmic solution Commonly known as: COSOPT Place 1 drop into both eyes 2 (two) times daily.   ferrous sulfate 325 (65 FE) MG EC tablet Take 325 mg by mouth daily with breakfast.   furosemide 20 MG tablet Commonly known as: LASIX Take 2 tablets (40 mg) by mouth once every other day as directed   levothyroxine 100 MCG tablet Commonly known as: SYNTHROID TAKE 1 TABLET BY MOUTH DAILY BEFORE BREAKFAST What changed:   how much to take  how to take this  when to take this   loperamide 2 MG capsule Commonly known as: IMODIUM Take 2 mg by mouth as needed for diarrhea or loose stools.   multivitamin tablet Take 1 tablet by mouth daily.   pantoprazole 40 MG tablet Commonly known as: PROTONIX TAKE 1 TABLET(40 MG) BY MOUTH DAILY   polyethylene glycol 17 g packet Commonly known as: MIRALAX / GLYCOLAX Take 17 g by mouth daily as needed for moderate constipation.   pravastatin 40 MG tablet Commonly known as: PRAVACHOL TAKE ONE-HALF TABLET BY MOUTH DAILY What changed: when to take this   simethicone 125 MG chewable tablet Commonly known as: MYLICON Chew 062 mg by mouth every 6 (six) hours as needed for flatulence.   tamsulosin 0.4 MG Caps capsule Commonly known as: FLOMAX TAKE 1 CAPSULE(0.4 MG) BY MOUTH DAILY What  changed: See the new instructions.   VITAMIN B-12 IJ Inject 1,000 mcg as directed every 30 (thirty) days.   warfarin 1 MG tablet Commonly known as: COUMADIN Take as directed by the anticoagulation clinic. If you are unsure how to take this medication, talk to your nurse or doctor. Original instructions: TAKE 1 AND 1/2 TABLETS BY MOUTH DAILY AS DIRECTED       Allergies:  Allergies  Allergen Reactions  . Penicillins Rash and Other (See Comments)    Has patient had a PCN reaction causing immediate rash, facial/tongue/throat swelling, SOB or lightheadedness with hypotension: {no Has patient had a PCN reaction causing severe rash involving mucus membranes or skin necrosis: {no Has patient had a PCN reaction that required hospitalization {no Has patient had a PCN reaction occurring within the last 10 years: {no  If all of the above answers are "NO", then may proceed with Cephalosporin use.    Family History: Family History  Problem Relation Age of Onset  . Breast cancer Mother   . Bone cancer Father   . Alcohol abuse Other   . Coronary artery disease Other   . Dementia Other   . Diabetes Other     Social History:  reports that he has never smoked. He has never used smokeless tobacco. He reports that he does not drink alcohol or use drugs.  ROS: UROLOGY Frequent Urination?: No Hard to postpone urination?: Yes Burning/pain with urination?: No Get up at night to urinate?: No Leakage of urine?: Yes Urine stream starts and stops?: No Trouble starting stream?: No Do you have to strain to urinate?: No Blood in urine?: No Urinary tract infection?: Yes Sexually transmitted disease?: No Injury to kidneys or bladder?: No Painful intercourse?: No Weak stream?: No Erection problems?: No Penile pain?: No  Gastrointestinal Nausea?: No Vomiting?: No Indigestion/heartburn?: No Diarrhea?: Yes Constipation?: Yes  Constitutional Fever: No Night sweats?: No Weight loss?: Yes  Fatigue?: Yes  Skin Skin rash/lesions?: No Itching?: No  Eyes Blurred vision?: No Double vision?: No  Ears/Nose/Throat Sore throat?: No Sinus problems?: No  Hematologic/Lymphatic Swollen glands?: No Easy bruising?: Yes  Cardiovascular Leg swelling?: Yes Chest pain?: No  Respiratory Cough?: No Shortness of breath?: No  Endocrine Excessive thirst?: No  Musculoskeletal Back pain?: No Joint pain?: No  Neurological Headaches?: No Dizziness?: No  Psychologic Depression?: No Anxiety?: No  Physical Exam: BP (!) 95/59   Pulse 86   Ht 5' 9" (1.753 m)   Wt 135 lb (61.2 kg)   BMI 19.94 kg/m   Constitutional:  Alert and oriented, No acute distress. HEENT: [place] AT, moist mucus membranes.  Trachea midline, no masses. Cardiovascular: No clubbing, cyanosis, or edema. Respiratory: Normal respiratory effort, no increased work of breathing. GI: Abdomen is soft, nontender, nondistended, no abdominal masses GU: No CVA tenderness Lymph: No cervical or inguinal lymphadenopathy. Skin: No rashes, bruises or suspicious lesions. Neurologic: Grossly intact, no focal deficits, moving all 4 extremities. Psychiatric: Normal mood and affect.   Assessment & Plan:    - Bulbous urethral stricture Since his procedure was 2 months ago I asked clinical staff to perform an in/out catheter.  A 14 French red rubber would not advance however a 14 [place] Coloplast catheter met some initial resistance then popped through into the bladder.  Schedule follow-up cystoscopy.   Abbie Sons, [place] [address], [place], [place] [address] [phone]

## 2018-11-23 ENCOUNTER — Ambulatory Visit (INDEPENDENT_AMBULATORY_CARE_PROVIDER_SITE_OTHER): Payer: Medicare Other

## 2018-11-23 ENCOUNTER — Other Ambulatory Visit (INDEPENDENT_AMBULATORY_CARE_PROVIDER_SITE_OTHER): Payer: Medicare Other

## 2018-11-23 ENCOUNTER — Telehealth (INDEPENDENT_AMBULATORY_CARE_PROVIDER_SITE_OTHER): Payer: Medicare Other

## 2018-11-23 DIAGNOSIS — E538 Deficiency of other specified B group vitamins: Secondary | ICD-10-CM

## 2018-11-23 MED ORDER — CYANOCOBALAMIN 1000 MCG/ML IJ SOLN
1000.0000 ug | Freq: Once | INTRAMUSCULAR | Status: AC
  Administered 2018-11-23: 1000 ug via INTRAMUSCULAR

## 2018-11-23 NOTE — Progress Notes (Signed)
Per orders of Dr. Lorelei Pont, injection of monthly B12 given by Kris Mouton. Patient tolerated injection well.  Vitamin B12 level was checked prior to the injection.

## 2018-11-23 NOTE — Telephone Encounter (Signed)
yes

## 2018-11-23 NOTE — Addendum Note (Signed)
Addended by: Kris Mouton on: 11/23/2018 12:28 PM   Modules accepted: Orders

## 2018-11-23 NOTE — Telephone Encounter (Signed)
Order placed and added to the lab schedule

## 2018-11-23 NOTE — Telephone Encounter (Signed)
Dr Lorelei Pont, patient has nurse visit today for B12 injection. Last B12 level was checked in July 2019. Would you like patient to have this checked today before injection?

## 2018-11-24 LAB — VITAMIN B12: Vitamin B-12: 468 pg/mL (ref 211–911)

## 2018-11-30 ENCOUNTER — Ambulatory Visit (INDEPENDENT_AMBULATORY_CARE_PROVIDER_SITE_OTHER): Payer: Medicare Other

## 2018-11-30 ENCOUNTER — Other Ambulatory Visit: Payer: Self-pay

## 2018-11-30 DIAGNOSIS — Z5181 Encounter for therapeutic drug level monitoring: Secondary | ICD-10-CM | POA: Diagnosis not present

## 2018-11-30 DIAGNOSIS — I480 Paroxysmal atrial fibrillation: Secondary | ICD-10-CM | POA: Diagnosis not present

## 2018-11-30 LAB — POCT INR: INR: 3.7 — AB (ref 2.0–3.0)

## 2018-11-30 NOTE — Patient Instructions (Signed)
Please skip warfarin tonight, then START NEW DOSAGE of 1.5 tablets every day EXCEPT 1 tablet on MONDAYS, [place]. Recheck in 3 weeks.

## 2018-12-15 ENCOUNTER — Ambulatory Visit (INDEPENDENT_AMBULATORY_CARE_PROVIDER_SITE_OTHER): Payer: Medicare Other | Admitting: Urology

## 2018-12-15 ENCOUNTER — Other Ambulatory Visit: Payer: Self-pay

## 2018-12-15 ENCOUNTER — Encounter: Payer: Self-pay | Admitting: Urology

## 2018-12-15 VITALS — BP 141/93 | HR 90 | Ht 69.5 in | Wt 130.0 lb

## 2018-12-15 DIAGNOSIS — Z87448 Personal history of other diseases of urinary system: Secondary | ICD-10-CM | POA: Diagnosis not present

## 2018-12-15 LAB — URINALYSIS, COMPLETE
Bilirubin, UA: NEGATIVE
Glucose, UA: NEGATIVE
Ketones, UA: NEGATIVE
Nitrite, UA: POSITIVE — AB
Specific Gravity, UA: 1.02 (ref 1.005–1.030)
Urobilinogen, Ur: 0.2 mg/dL (ref 0.2–1.0)
pH, UA: 5.5 (ref 5.0–7.5)

## 2018-12-15 LAB — MICROSCOPIC EXAMINATION
RBC, Urine: >30 /hpf — AB (ref 0–2)
WBC, UA: >30 /hpf — AB (ref 0–5)

## 2018-12-15 MED ORDER — LIDOCAINE HCL URETHRAL/MUCOSAL 2 % EX GEL
1.0000 "application " | Freq: Once | CUTANEOUS | Status: AC
  Administered 2018-12-15: 1 via URETHRAL

## 2018-12-15 NOTE — Progress Notes (Signed)
   12/15/18  CC:  Chief Complaint  Patient presents with  . Cysto    HPI:  Blood pressure (!) 141/93, pulse 90, height 5' 9.5" (1.765 m), weight 130 lb (59 kg). NED. A&Ox3.   No respiratory distress   Abd soft, NT, ND Normal phallus with bilateral descended testicles  Cystoscopy Procedure Note  Patient identification was confirmed, informed consent was obtained, and patient was prepped using Betadine solution.  Lidocaine jelly was administered per urethral meatus.     Pre-Procedure: - Inspection reveals a normal caliber ureteral meatus.  Procedure: The flexible cystoscope was introduced without difficulty - Bulbar urethral stricture; able to negotiate with the flexible scope - Nonocclusive prostate  - Normal bladder neck - Bilateral ureteral orifices identified - Bladder mucosa  reveals anterior calcification secondary to previously noted eroded hernia mesh   Post-Procedure: - Patient tolerated the procedure well  Assessment/ Plan: -Recurrent urethral stricture -Unable to perform intermittent catheterization. -We will have him return to 3-4 weeks for in/out catheterization.   Abbie Sons, MD

## 2018-12-18 ENCOUNTER — Encounter: Payer: Self-pay | Admitting: Urology

## 2018-12-21 ENCOUNTER — Other Ambulatory Visit: Payer: Self-pay

## 2018-12-21 ENCOUNTER — Ambulatory Visit (INDEPENDENT_AMBULATORY_CARE_PROVIDER_SITE_OTHER): Payer: Medicare Other

## 2018-12-21 DIAGNOSIS — I480 Paroxysmal atrial fibrillation: Secondary | ICD-10-CM

## 2018-12-21 DIAGNOSIS — Z5181 Encounter for therapeutic drug level monitoring: Secondary | ICD-10-CM

## 2018-12-21 LAB — POCT INR: INR: 2.8 (ref 2.0–3.0)

## 2018-12-21 NOTE — Patient Instructions (Signed)
Please continue dosage of 1.5 tablets every day EXCEPT 1 tablet on MONDAYS, [place]. Recheck in 4 weeks.

## 2019-01-02 DIAGNOSIS — Z872 Personal history of diseases of the skin and subcutaneous tissue: Secondary | ICD-10-CM | POA: Diagnosis not present

## 2019-01-02 DIAGNOSIS — L570 Actinic keratosis: Secondary | ICD-10-CM | POA: Diagnosis not present

## 2019-01-02 DIAGNOSIS — L218 Other seborrheic dermatitis: Secondary | ICD-10-CM | POA: Diagnosis not present

## 2019-01-02 DIAGNOSIS — L578 Other skin changes due to chronic exposure to nonionizing radiation: Secondary | ICD-10-CM | POA: Diagnosis not present

## 2019-01-02 DIAGNOSIS — Z85828 Personal history of other malignant neoplasm of skin: Secondary | ICD-10-CM | POA: Diagnosis not present

## 2019-01-02 DIAGNOSIS — Z859 Personal history of malignant neoplasm, unspecified: Secondary | ICD-10-CM | POA: Diagnosis not present

## 2019-01-04 ENCOUNTER — Ambulatory Visit (INDEPENDENT_AMBULATORY_CARE_PROVIDER_SITE_OTHER): Payer: Medicare Other

## 2019-01-04 DIAGNOSIS — Z23 Encounter for immunization: Secondary | ICD-10-CM

## 2019-01-04 DIAGNOSIS — E538 Deficiency of other specified B group vitamins: Secondary | ICD-10-CM | POA: Diagnosis not present

## 2019-01-04 MED ORDER — CYANOCOBALAMIN 1000 MCG/ML IJ SOLN
1000.0000 ug | Freq: Once | INTRAMUSCULAR | Status: AC
  Administered 2019-01-04: 1000 ug via INTRAMUSCULAR

## 2019-01-04 NOTE — Progress Notes (Signed)
Pt given monthly B12 in right deltoid. He tolerated it well.

## 2019-01-05 ENCOUNTER — Other Ambulatory Visit: Payer: Self-pay

## 2019-01-05 ENCOUNTER — Ambulatory Visit (INDEPENDENT_AMBULATORY_CARE_PROVIDER_SITE_OTHER): Payer: Medicare Other | Admitting: *Deleted

## 2019-01-05 DIAGNOSIS — Z87448 Personal history of other diseases of urinary system: Secondary | ICD-10-CM

## 2019-01-05 NOTE — Progress Notes (Signed)
In and Out Catheterization  Patient is present today for a I & O catheterization due to urethral stricture. Patient was cleaned and prepped in a sterile fashion with betadine and Lidocaine 2% jelly was instilled into the urethra.  A 14FR coude cath was inserted no complications were noted , 36ml of urine return was noted, urine was yellow in color.  Bladder was drained and catheter was removed with out difficulty.    Performed by: Verlene Mayer, CMA  Follow up/ Additional notes: One month for in/out per Dr. Bernardo Heater

## 2019-01-12 ENCOUNTER — Other Ambulatory Visit: Payer: Self-pay | Admitting: Internal Medicine

## 2019-01-14 ENCOUNTER — Other Ambulatory Visit: Payer: Self-pay | Admitting: Family Medicine

## 2019-01-18 ENCOUNTER — Ambulatory Visit (INDEPENDENT_AMBULATORY_CARE_PROVIDER_SITE_OTHER): Payer: Medicare Other

## 2019-01-18 ENCOUNTER — Other Ambulatory Visit: Payer: Self-pay

## 2019-01-18 DIAGNOSIS — Z5181 Encounter for therapeutic drug level monitoring: Secondary | ICD-10-CM

## 2019-01-18 DIAGNOSIS — I480 Paroxysmal atrial fibrillation: Secondary | ICD-10-CM

## 2019-01-18 LAB — POCT INR: INR: 1.8 — AB (ref 2.0–3.0)

## 2019-01-18 NOTE — Patient Instructions (Signed)
Please take 2 tablets tonight, then continue dosage of 1.5 tablets every day EXCEPT 1 tablet on MONDAYS, [place]. Recheck in 4 weeks.

## 2019-01-24 ENCOUNTER — Ambulatory Visit (INDEPENDENT_AMBULATORY_CARE_PROVIDER_SITE_OTHER): Payer: Medicare Other | Admitting: *Deleted

## 2019-01-24 DIAGNOSIS — I480 Paroxysmal atrial fibrillation: Secondary | ICD-10-CM

## 2019-01-24 DIAGNOSIS — I495 Sick sinus syndrome: Secondary | ICD-10-CM

## 2019-01-25 LAB — CUP PACEART REMOTE DEVICE CHECK
Battery Remaining Longevity: 86 mo
Battery Voltage: 3.01 V
Brady Statistic AP VP Percent: 4.56 %
Brady Statistic AP VS Percent: 86.98 %
Brady Statistic AS VP Percent: 1.66 %
Brady Statistic AS VS Percent: 6.79 %
Brady Statistic RA Percent Paced: 90.78 %
Brady Statistic RV Percent Paced: 6.56 %
Date Time Interrogation Session: 20201027135046
Implantable Lead Implant Date: 20070525
Implantable Lead Implant Date: 20070525
Implantable Lead Location: 753859
Implantable Lead Location: 753860
Implantable Lead Model: 5076
Implantable Lead Model: 5076
Implantable Pulse Generator Implant Date: 20171127
Lead Channel Impedance Value: 361 Ohm
Lead Channel Impedance Value: 399 Ohm
Lead Channel Impedance Value: 399 Ohm
Lead Channel Impedance Value: 418 Ohm
Lead Channel Pacing Threshold Amplitude: 0.75 V
Lead Channel Pacing Threshold Amplitude: 0.75 V
Lead Channel Pacing Threshold Pulse Width: 0.4 ms
Lead Channel Pacing Threshold Pulse Width: 0.4 ms
Lead Channel Sensing Intrinsic Amplitude: 1.25 mV
Lead Channel Sensing Intrinsic Amplitude: 1.25 mV
Lead Channel Sensing Intrinsic Amplitude: 1.75 mV
Lead Channel Sensing Intrinsic Amplitude: 1.75 mV
Lead Channel Setting Pacing Amplitude: 2 V
Lead Channel Setting Pacing Amplitude: 2.5 V
Lead Channel Setting Pacing Pulse Width: 0.4 ms
Lead Channel Setting Sensing Sensitivity: 0.6 mV

## 2019-02-07 ENCOUNTER — Ambulatory Visit (INDEPENDENT_AMBULATORY_CARE_PROVIDER_SITE_OTHER): Payer: Medicare Other | Admitting: Physician Assistant

## 2019-02-07 ENCOUNTER — Encounter: Payer: Self-pay | Admitting: Physician Assistant

## 2019-02-07 ENCOUNTER — Other Ambulatory Visit: Payer: Self-pay

## 2019-02-07 VITALS — BP 117/78 | HR 80 | Ht 69.5 in | Wt 135.0 lb

## 2019-02-07 DIAGNOSIS — N35912 Unspecified bulbous urethral stricture, male: Secondary | ICD-10-CM | POA: Diagnosis not present

## 2019-02-07 LAB — BLADDER SCAN AMB NON-IMAGING: Scan Result: 62 mL

## 2019-02-07 NOTE — Addendum Note (Signed)
Addended by: Mickle Plumb on: 02/07/2019 02:56 PM   Modules accepted: Orders

## 2019-02-07 NOTE — Progress Notes (Addendum)
In and Out Catheterization  Patient is present today for a I & O catheterization due to urethral stricture. Patient was cleaned and prepped in a sterile fashion with betadine and Lidocaine 2% jelly was instilled into the urethra.  Three attempts were made to pass a catheter into the urinary bladder: first with a 14Fr coude [employer], second with a 12Fr coude latex-free catheter, and third with a 14Fr straight [employer]. Despite three attempts, I was unable to pass the catheter into the patient's bladder, noting significant resistance as I reached the level of the bulbar urethra each time. Some light bleeding was noted with the third attempt and the procedure was terminated.  Patient reports continuing to be able to urinate. PVR 41mL today. No abdominal distension or tenderness on physical exam. As he is not in urinary retention, he was scheduled for follow-up cystoscopy with possible dilation by Dr. Bernardo Heater in three days.   I counseled the patient on signs and symptoms of urinary retention, including lower abdominal pain, lumbar pain, abdominal distention, and the inability to urinate.  I advised him to contact the office for assistance if he develops these symptoms during routine office hours, 8 AM to 5 PM Monday through Friday.  If outside those hours, I advised him to proceed to the emergency department. He expressed understanding.  Results for orders placed or performed in visit on 02/07/19  Bladder Scan (Post Void Residual) in office  Result Value Ref Range   Scan Result 62 mL   Performed by: Debroah Loop, PA-C  Assisted by: Zara Council, PA-C  I spent 25 minutes with this patient, of which greater than 50% was spent in counseling and coordination of care with the patient.   Debroah Loop, PA-C  02/07/19 2:55 PM

## 2019-02-10 ENCOUNTER — Other Ambulatory Visit: Payer: Self-pay

## 2019-02-10 ENCOUNTER — Ambulatory Visit (INDEPENDENT_AMBULATORY_CARE_PROVIDER_SITE_OTHER): Payer: Medicare Other | Admitting: Urology

## 2019-02-10 ENCOUNTER — Encounter: Payer: Self-pay | Admitting: Urology

## 2019-02-10 VITALS — BP 130/74 | HR 74 | Ht 71.0 in | Wt 134.0 lb

## 2019-02-10 DIAGNOSIS — N35912 Unspecified bulbous urethral stricture, male: Secondary | ICD-10-CM | POA: Diagnosis not present

## 2019-02-10 LAB — URINALYSIS, COMPLETE
Bilirubin, UA: NEGATIVE
Glucose, UA: NEGATIVE
Ketones, UA: NEGATIVE
Nitrite, UA: NEGATIVE
Specific Gravity, UA: 1.015 (ref 1.005–1.030)
Urobilinogen, Ur: 0.2 mg/dL (ref 0.2–1.0)
pH, UA: 5.5 (ref 5.0–7.5)

## 2019-02-10 LAB — MICROSCOPIC EXAMINATION: WBC, UA: >30 /hpf — AB (ref 0–5)

## 2019-02-10 MED ORDER — SULFAMETHOXAZOLE-TRIMETHOPRIM 800-160 MG PO TABS: 1.0000 | ORAL_TABLET | Freq: Two times a day (BID) | ORAL | 0 refills | Status: AC

## 2019-02-10 MED ORDER — CIPROFLOXACIN HCL 500 MG PO TABS
500.0000 mg | ORAL_TABLET | Freq: Once | ORAL | Status: AC
  Administered 2019-02-10: 500 mg via ORAL

## 2019-02-10 NOTE — Progress Notes (Signed)
   02/10/19  CC:  Chief Complaint  Patient presents with  . Cysto    HPI:  Blood pressure 130/74, pulse 74, height 5\' 11"  (1.803 m), weight 134 lb (60.8 kg). NED. A&Ox3.   No respiratory distress   Abd soft, NT, ND Normal phallus with bilateral descended testicles  Cystoscopy/Urethral Dilation Procedure Note  Patient identification was confirmed, informed consent was obtained, and patient was prepped using Betadine solution.  Lidocaine jelly was administered per urethral meatus.     Pre-Procedure: - Inspection reveals a normal caliber urethral meatus.  Procedure: The flexible cystoscope was introduced without difficulty. In the bulbar urethra a stricture was noted which the flexible cystoscope was unable to negotiate.  A guidewire was placed to the cystoscope and under direct vision passed through the stricture and into the bladder.  The stricture was sequentially dilated from 10-20 [place] with over-the-wire dilators.  Clear urine was obtained after the 10 [place] dilator was passed.  A 16 French [place] catheter was placed over the wire without difficulty and placed to gravity drainage.   Post-Procedure: - Patient tolerated the procedure well  Assessment/ Plan: -Follow-up next week for catheter removal -He received Cipro 500 mg p.o. prior to procedure -Rx Septra DS 1 p.o. twice daily x3 days sent to pharmacy.   Abbie Sons, MD

## 2019-02-14 ENCOUNTER — Encounter: Payer: Self-pay | Admitting: Emergency Medicine

## 2019-02-14 ENCOUNTER — Emergency Department: Payer: Medicare Other

## 2019-02-14 ENCOUNTER — Ambulatory Visit: Payer: Medicare Other

## 2019-02-14 ENCOUNTER — Inpatient Hospital Stay
Admission: EM | Admit: 2019-02-14 | Discharge: 2019-02-20 | DRG: 521 | Disposition: A | Discharge status: Skilled Nursing Facility | Payer: Medicare Other | Attending: Internal Medicine | Admitting: Internal Medicine

## 2019-02-14 ENCOUNTER — Other Ambulatory Visit: Payer: Self-pay

## 2019-02-14 DIAGNOSIS — Z923 Personal history of irradiation: Secondary | ICD-10-CM | POA: Diagnosis not present

## 2019-02-14 DIAGNOSIS — E875 Hyperkalemia: Secondary | ICD-10-CM | POA: Diagnosis not present

## 2019-02-14 DIAGNOSIS — S72001A Fracture of unspecified part of neck of right femur, initial encounter for closed fracture: Principal | ICD-10-CM

## 2019-02-14 DIAGNOSIS — G8918 Other acute postprocedural pain: Secondary | ICD-10-CM

## 2019-02-14 DIAGNOSIS — E785 Hyperlipidemia, unspecified: Secondary | ICD-10-CM | POA: Diagnosis not present

## 2019-02-14 DIAGNOSIS — K219 Gastro-esophageal reflux disease without esophagitis: Secondary | ICD-10-CM | POA: Diagnosis not present

## 2019-02-14 DIAGNOSIS — H409 Unspecified glaucoma: Secondary | ICD-10-CM | POA: Insufficient documentation

## 2019-02-14 DIAGNOSIS — Z20828 Contact with and (suspected) exposure to other viral communicable diseases: Secondary | ICD-10-CM | POA: Diagnosis present

## 2019-02-14 DIAGNOSIS — I480 Paroxysmal atrial fibrillation: Secondary | ICD-10-CM | POA: Diagnosis not present

## 2019-02-14 DIAGNOSIS — E538 Deficiency of other specified B group vitamins: Secondary | ICD-10-CM | POA: Diagnosis present

## 2019-02-14 DIAGNOSIS — N400 Enlarged prostate without lower urinary tract symptoms: Secondary | ICD-10-CM | POA: Diagnosis not present

## 2019-02-14 DIAGNOSIS — Y92000 Kitchen of unspecified non-institutional (private) residence as  the place of occurrence of the external cause: Secondary | ICD-10-CM

## 2019-02-14 DIAGNOSIS — Z8711 Personal history of peptic ulcer disease: Secondary | ICD-10-CM

## 2019-02-14 DIAGNOSIS — Z7401 Bed confinement status: Secondary | ICD-10-CM | POA: Diagnosis not present

## 2019-02-14 DIAGNOSIS — R270 Ataxia, unspecified: Secondary | ICD-10-CM | POA: Diagnosis not present

## 2019-02-14 DIAGNOSIS — I495 Sick sinus syndrome: Secondary | ICD-10-CM | POA: Diagnosis present

## 2019-02-14 DIAGNOSIS — Z8249 Family history of ischemic heart disease and other diseases of the circulatory system: Secondary | ICD-10-CM

## 2019-02-14 DIAGNOSIS — Z79899 Other long term (current) drug therapy: Secondary | ICD-10-CM

## 2019-02-14 DIAGNOSIS — Z6824 Body mass index (BMI) 24.0-24.9, adult: Secondary | ICD-10-CM

## 2019-02-14 DIAGNOSIS — E039 Hypothyroidism, unspecified: Secondary | ICD-10-CM | POA: Diagnosis not present

## 2019-02-14 DIAGNOSIS — D631 Anemia in chronic kidney disease: Secondary | ICD-10-CM | POA: Diagnosis present

## 2019-02-14 DIAGNOSIS — M810 Age-related osteoporosis without current pathological fracture: Secondary | ICD-10-CM | POA: Diagnosis present

## 2019-02-14 DIAGNOSIS — W010XXA Fall on same level from slipping, tripping and stumbling without subsequent striking against object, initial encounter: Secondary | ICD-10-CM | POA: Diagnosis present

## 2019-02-14 DIAGNOSIS — Z8546 Personal history of malignant neoplasm of prostate: Secondary | ICD-10-CM

## 2019-02-14 DIAGNOSIS — I129 Hypertensive chronic kidney disease with stage 1 through stage 4 chronic kidney disease, or unspecified chronic kidney disease: Secondary | ICD-10-CM | POA: Diagnosis not present

## 2019-02-14 DIAGNOSIS — S0990XA Unspecified injury of head, initial encounter: Secondary | ICD-10-CM | POA: Diagnosis not present

## 2019-02-14 DIAGNOSIS — Z471 Aftercare following joint replacement surgery: Secondary | ICD-10-CM | POA: Diagnosis not present

## 2019-02-14 DIAGNOSIS — I4891 Unspecified atrial fibrillation: Secondary | ICD-10-CM

## 2019-02-14 DIAGNOSIS — Z01818 Encounter for other preprocedural examination: Secondary | ICD-10-CM | POA: Diagnosis not present

## 2019-02-14 DIAGNOSIS — Y93G3 Activity, cooking and baking: Secondary | ICD-10-CM | POA: Diagnosis not present

## 2019-02-14 DIAGNOSIS — S41111A Laceration without foreign body of right upper arm, initial encounter: Secondary | ICD-10-CM | POA: Diagnosis present

## 2019-02-14 DIAGNOSIS — Z880 Allergy status to penicillin: Secondary | ICD-10-CM

## 2019-02-14 DIAGNOSIS — Z03818 Encounter for observation for suspected exposure to other biological agents ruled out: Secondary | ICD-10-CM | POA: Diagnosis not present

## 2019-02-14 DIAGNOSIS — Z7901 Long term (current) use of anticoagulants: Secondary | ICD-10-CM

## 2019-02-14 DIAGNOSIS — Z419 Encounter for procedure for purposes other than remedying health state, unspecified: Secondary | ICD-10-CM

## 2019-02-14 DIAGNOSIS — Z95810 Presence of automatic (implantable) cardiac defibrillator: Secondary | ICD-10-CM | POA: Diagnosis not present

## 2019-02-14 DIAGNOSIS — W19XXXA Unspecified fall, initial encounter: Secondary | ICD-10-CM | POA: Diagnosis not present

## 2019-02-14 DIAGNOSIS — N1832 Chronic kidney disease, stage 3b: Secondary | ICD-10-CM

## 2019-02-14 DIAGNOSIS — E43 Unspecified severe protein-calorie malnutrition: Secondary | ICD-10-CM | POA: Diagnosis present

## 2019-02-14 DIAGNOSIS — I4811 Longstanding persistent atrial fibrillation: Secondary | ICD-10-CM

## 2019-02-14 DIAGNOSIS — Z85840 Personal history of malignant neoplasm of eye: Secondary | ICD-10-CM

## 2019-02-14 DIAGNOSIS — N184 Chronic kidney disease, stage 4 (severe): Secondary | ICD-10-CM | POA: Diagnosis present

## 2019-02-14 DIAGNOSIS — R58 Hemorrhage, not elsewhere classified: Secondary | ICD-10-CM | POA: Diagnosis not present

## 2019-02-14 DIAGNOSIS — Z66 Do not resuscitate: Secondary | ICD-10-CM | POA: Diagnosis present

## 2019-02-14 DIAGNOSIS — Z79890 Hormone replacement therapy (postmenopausal): Secondary | ICD-10-CM

## 2019-02-14 DIAGNOSIS — N183 Chronic kidney disease, stage 3 unspecified: Secondary | ICD-10-CM | POA: Diagnosis not present

## 2019-02-14 DIAGNOSIS — S72041A Displaced fracture of base of neck of right femur, initial encounter for closed fracture: Secondary | ICD-10-CM | POA: Diagnosis not present

## 2019-02-14 DIAGNOSIS — S72091A Other fracture of head and neck of right femur, initial encounter for closed fracture: Secondary | ICD-10-CM | POA: Diagnosis not present

## 2019-02-14 DIAGNOSIS — Z7983 Long term (current) use of bisphosphonates: Secondary | ICD-10-CM

## 2019-02-14 DIAGNOSIS — Z85828 Personal history of other malignant neoplasm of skin: Secondary | ICD-10-CM | POA: Diagnosis not present

## 2019-02-14 DIAGNOSIS — S299XXA Unspecified injury of thorax, initial encounter: Secondary | ICD-10-CM | POA: Diagnosis not present

## 2019-02-14 DIAGNOSIS — R5381 Other malaise: Secondary | ICD-10-CM | POA: Diagnosis not present

## 2019-02-14 DIAGNOSIS — M2550 Pain in unspecified joint: Secondary | ICD-10-CM | POA: Diagnosis not present

## 2019-02-14 DIAGNOSIS — Z87440 Personal history of urinary (tract) infections: Secondary | ICD-10-CM

## 2019-02-14 DIAGNOSIS — I440 Atrioventricular block, first degree: Secondary | ICD-10-CM | POA: Diagnosis present

## 2019-02-14 DIAGNOSIS — Z96641 Presence of right artificial hip joint: Secondary | ICD-10-CM | POA: Diagnosis not present

## 2019-02-14 DIAGNOSIS — R52 Pain, unspecified: Secondary | ICD-10-CM | POA: Diagnosis not present

## 2019-02-14 DIAGNOSIS — Z833 Family history of diabetes mellitus: Secondary | ICD-10-CM

## 2019-02-14 HISTORY — DX: Fracture of unspecified part of neck of right femur, initial encounter for closed fracture: S72.001A

## 2019-02-14 LAB — BASIC METABOLIC PANEL
Anion gap: 6 (ref 5–15)
BUN: 37 mg/dL — ABNORMAL HIGH (ref 8–23)
CO2: 21 mmol/L — ABNORMAL LOW (ref 22–32)
Calcium: 9 mg/dL (ref 8.9–10.3)
Chloride: 110 mmol/L (ref 98–111)
Creatinine, Ser: 2.15 mg/dL — ABNORMAL HIGH (ref 0.61–1.24)
GFR calc Af Amer: 30 mL/min — ABNORMAL LOW (ref >60)
GFR calc non Af Amer: 26 mL/min — ABNORMAL LOW (ref >60)
Glucose, Bld: 104 mg/dL — ABNORMAL HIGH (ref 70–99)
Potassium: 5 mmol/L (ref 3.5–5.1)
Sodium: 137 mmol/L (ref 135–145)

## 2019-02-14 LAB — CBC
HCT: 34.6 % — ABNORMAL LOW (ref 39.0–52.0)
Hemoglobin: 10.9 g/dL — ABNORMAL LOW (ref 13.0–17.0)
MCH: 31.9 pg (ref 26.0–34.0)
MCHC: 31.5 g/dL (ref 30.0–36.0)
MCV: 101.2 fL — ABNORMAL HIGH (ref 80.0–100.0)
Platelets: 167 10*3/uL (ref 150–400)
RBC: 3.42 MIL/uL — ABNORMAL LOW (ref 4.22–5.81)
RDW: 12.9 % (ref 11.5–15.5)
WBC: 6.5 10*3/uL (ref 4.0–10.5)
nRBC: 0 % (ref 0.0–0.2)

## 2019-02-14 LAB — URINALYSIS, COMPLETE (UACMP) WITH MICROSCOPIC
Bilirubin Urine: NEGATIVE
Glucose, UA: NEGATIVE mg/dL
Ketones, ur: NEGATIVE mg/dL
Nitrite: NEGATIVE
Protein, ur: 100 mg/dL — AB
Specific Gravity, Urine: 1.015 (ref 1.005–1.030)
WBC, UA: >50 WBC/hpf — ABNORMAL HIGH (ref 0–5)
pH: 5 (ref 5.0–8.0)

## 2019-02-14 LAB — PROTIME-INR
INR: 3.5 — ABNORMAL HIGH (ref 0.8–1.2)
Prothrombin Time: 34.9 seconds — ABNORMAL HIGH (ref 11.4–15.2)

## 2019-02-14 MED ORDER — POLYETHYLENE GLYCOL 3350 17 G PO PACK: 17.0000 g | PACK | Freq: Every day | ORAL | Status: DC | PRN

## 2019-02-14 MED ORDER — DOCUSATE SODIUM 100 MG PO CAPS: 100.0000 mg | ORAL_CAPSULE | Freq: Two times a day (BID) | ORAL | Status: DC | PRN

## 2019-02-14 MED ORDER — FUROSEMIDE 40 MG PO TABS: 40.0000 mg | ORAL_TABLET | ORAL | Status: DC

## 2019-02-14 MED ORDER — DORZOLAMIDE HCL-TIMOLOL MAL 2-0.5 % OP SOLN
1.0000 [drp] | Freq: Two times a day (BID) | OPHTHALMIC | Status: DC
  Administered 2019-02-15 – 2019-02-20 (×10): 1 [drp] via OPHTHALMIC
  Filled 2019-02-14 (×2): qty 10

## 2019-02-14 MED ORDER — PRAVASTATIN SODIUM 20 MG PO TABS
20.0000 mg | ORAL_TABLET | Freq: Every day | ORAL | Status: DC
  Administered 2019-02-14 – 2019-02-19 (×6): 20 mg via ORAL
  Filled 2019-02-14 (×7): qty 1

## 2019-02-14 MED ORDER — MORPHINE SULFATE (PF) 4 MG/ML IV SOLN
4.0000 mg | Freq: Once | INTRAVENOUS | Status: AC
  Administered 2019-02-14: 4 mg via INTRAVENOUS
  Filled 2019-02-14: qty 1

## 2019-02-14 MED ORDER — ONDANSETRON HCL 4 MG PO TABS: 4.0000 mg | ORAL_TABLET | Freq: Four times a day (QID) | ORAL | Status: DC | PRN

## 2019-02-14 MED ORDER — ONDANSETRON HCL 4 MG/2ML IJ SOLN: 4.0000 mg | Freq: Four times a day (QID) | INTRAMUSCULAR | Status: DC | PRN

## 2019-02-14 MED ORDER — PANTOPRAZOLE SODIUM 40 MG PO TBEC
40.0000 mg | DELAYED_RELEASE_TABLET | Freq: Every day | ORAL | Status: DC
  Administered 2019-02-15 – 2019-02-17 (×2): 40 mg via ORAL
  Filled 2019-02-14 (×2): qty 1

## 2019-02-14 MED ORDER — PHYTONADIONE 5 MG PO TABS
5.0000 mg | ORAL_TABLET | Freq: Once | ORAL | Status: DC
  Filled 2019-02-14: qty 1

## 2019-02-14 MED ORDER — ADULT MULTIVITAMIN W/MINERALS CH
1.0000 | ORAL_TABLET | Freq: Every day | ORAL | Status: DC
  Administered 2019-02-15 – 2019-02-20 (×5): 1 via ORAL
  Filled 2019-02-14 (×5): qty 1

## 2019-02-14 MED ORDER — MORPHINE SULFATE (PF) 2 MG/ML IV SOLN: 2.0000 mg | INTRAVENOUS | Status: DC | PRN

## 2019-02-14 MED ORDER — DOCUSATE SODIUM 50 MG PO CAPS: 50.0000 mg | ORAL_CAPSULE | Freq: Two times a day (BID) | ORAL | Status: DC | PRN

## 2019-02-14 MED ORDER — ACETAMINOPHEN 500 MG PO TABS
500.0000 mg | ORAL_TABLET | Freq: Four times a day (QID) | ORAL | Status: DC | PRN
  Administered 2019-02-15 (×2): 500 mg via ORAL
  Filled 2019-02-14: qty 1

## 2019-02-14 MED ORDER — OXYCODONE HCL 5 MG PO TABS
5.0000 mg | ORAL_TABLET | ORAL | Status: DC | PRN
  Administered 2019-02-14 – 2019-02-20 (×4): 5 mg via ORAL
  Filled 2019-02-14 (×5): qty 1

## 2019-02-14 MED ORDER — AMIODARONE HCL 200 MG PO TABS
100.0000 mg | ORAL_TABLET | Freq: Every day | ORAL | Status: DC
  Administered 2019-02-14 – 2019-02-19 (×6): 100 mg via ORAL
  Filled 2019-02-14 (×7): qty 1

## 2019-02-14 MED ORDER — LEVOTHYROXINE SODIUM 100 MCG PO TABS
100.0000 ug | ORAL_TABLET | Freq: Every day | ORAL | Status: DC
  Administered 2019-02-15 – 2019-02-20 (×6): 100 ug via ORAL
  Filled 2019-02-14 (×7): qty 1

## 2019-02-14 MED ORDER — TAMSULOSIN HCL 0.4 MG PO CAPS
0.4000 mg | ORAL_CAPSULE | Freq: Every day | ORAL | Status: DC
  Administered 2019-02-15 – 2019-02-19 (×4): 0.4 mg via ORAL
  Filled 2019-02-14 (×5): qty 1

## 2019-02-14 MED ORDER — FERROUS SULFATE 325 (65 FE) MG PO TABS
325.0000 mg | ORAL_TABLET | Freq: Every day | ORAL | Status: DC
  Administered 2019-02-15 – 2019-02-20 (×5): 325 mg via ORAL
  Filled 2019-02-14 (×6): qty 1

## 2019-02-14 NOTE — ED Triage Notes (Signed)
Pt from home via EMS, had mechanical fall. C/o RT side pain (hip) , no deformity noted. PT has bruising throughout noted. PT also had head injury with no LOC. Pt is on anticoags . PT A&OX4 , VS

## 2019-02-14 NOTE — Progress Notes (Signed)
Patient ID: [name] [name], male   DOB: [birth year], 85 y.o.   MRN: [id number]  INR 3.5.  Give 1 dose of vitamin K 5 mg p.o. x1.  Recheck INR daily.  Once INR less than 1.6 should be good for surgery.  If sooner surgery planned can give FFP.  Dr. Loletha Grayer

## 2019-02-14 NOTE — ED Notes (Signed)
All patient belongings given to daughter in patient belongings bag and she reports she is taking them home

## 2019-02-14 NOTE — ED Notes (Signed)
Recollect blue top sent to lab 

## 2019-02-14 NOTE — H&P (Addendum)
[facility] NAME: [name] [name]    MR#:  [id number]  DATE OF BIRTH:  June 30, 1922  DATE OF ADMISSION:  02/14/2019  PRIMARY CARE PHYSICIAN: Owens Loffler, MD   REQUESTING/REFERRING PHYSICIAN: Dr. Carrie Mew  CHIEF COMPLAINT:   Chief Complaint  Patient presents with  . Fall    HISTORY OF PRESENT ILLNESS:  [name] [name]  is a 85 y.o. male with a known history of paroxysmal atrial fibrillation.  He presents to the ER after having a fall.  He said he was fixing breakfast this morning and his feet got tangled and he had a fall.  He landed on his right side and he lost feeling in his right leg.  He bumped his head but no loss of consciousness.  He cut his right arm and was bleeding.  His daughter was in the next room and called EMS.  He is having quite a bit of pain in his right hip.  In the ER he was found to have a right hip fracture.  Hospitalist services contacted for further evaluation.  PAST MEDICAL HISTORY:   Past Medical History:  Diagnosis Date  . AICD (automatic cardioverter/defibrillator) present    PACEMAKER  . Anemia   . Cancer ([place])   . Cardiac pacemaker in situ 07/2005   symptomatic bradycardia  . CKD (chronic kidney disease) stage 3, GFR 30-59 ml/min 04/13/2017  . Diverticulosis of colon (without mention of hemorrhage)   . Dysrhythmia   . 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 GERD (gastroesophageal reflux disease)   . GI hemorrhage   . Hyperlipidemia   . Hypertension   . Lower extremity edema   . Neck arthritis, Severe, multi-level 06/09/2013  . Paroxysmal atrial fibrillation ([name])    s/p failed ablation  . Peptic ulcer, unspecified site, unspecified as acute or chronic, without mention of hemorrhage, perforation, or obstruction 1975  . Personal history of malignant neoplasm of prostate 12/2000   5 weeks radiation, radiation seeds  .  Radiation proctitis   . Sinoatrial node dysfunction ([name])   . Unspecified glaucoma(365.9)   . Unspecified hypothyroidism   . Vitamin B12 deficiency     PAST SURGICAL HISTORY:   Past Surgical History:  Procedure Laterality Date  . CARDIAC CATHETERIZATION    . CARDIAC ELECTROPHYSIOLOGY [place] AND ABLATION  2001, 2003   failure  . CATARACT EXTRACTION    . CYSTOGRAM N/A 09/21/2018   Procedure: CYSTOGRAM;  Surgeon: Abbie Sons, MD;  Location: [facility]S;  Service: Urology;  Laterality: N/A;  . CYSTOSCOPY WITH URETHRAL DILATATION N/A 01/29/2017   Procedure: CYSTOSCOPY WITH URETHRAL DILATATION;  Surgeon: Abbie Sons, MD;  Location: [facility] ORS;  Service: Urology;  Laterality: N/A;  . CYSTOSCOPY WITH URETHRAL DILATATION N/A 09/21/2018   Procedure: CYSTOSCOPY WITH URETHRAL DILATATION;  Surgeon: Abbie Sons, MD;  Location: [facility]S;  Service: Urology;  Laterality: N/A;  . EP IMPLANTABLE DEVICE N/A 02/24/2016   Procedure: PPM Generator Changeout;  Surgeon: Deboraha Sprang, MD;  Location: [facility];  Service: Cardiovascular;  Laterality: N/A;  . gastric ulcer surgery    . HERNIA REPAIR  1994, 2001, 2003   s/p drainage and complications, 07/8097, 10/3380 mesh removal  . INSERT / REPLACE / REMOVE PACEMAKER  07/2005   symptomatic bradycardia  . kidney stones    . NOSE SURGERY  cancer removed   . PROSTATE SURGERY    . SKIN CANCER DESTRUCTION    . TOOTH EXTRACTION      SOCIAL HISTORY:   Social History   Tobacco Use  . Smoking status: Never Smoker  . Smokeless tobacco: Never Used  Substance Use Topics  . Alcohol use: No    FAMILY HISTORY:   Family History  Problem Relation Age of Onset  . Breast cancer Mother   . Bone cancer Father   . Alcohol abuse Other   . Coronary artery disease Other   . Dementia Other   . Diabetes Other     DRUG ALLERGIES:   Allergies  Allergen Reactions  . Penicillins Rash and Other (See Comments)    Has patient had a PCN reaction  causing immediate rash, facial/tongue/throat swelling, SOB or lightheadedness with hypotension: {no Has patient had a PCN reaction causing severe rash involving mucus membranes or skin necrosis: {no Has patient had a PCN reaction that required hospitalization {no Has patient had a PCN reaction occurring within the last 10 years: {no If all of the above answers are "NO", then may proceed with Cephalosporin use.    REVIEW OF SYSTEMS:  CONSTITUTIONAL: No fever, fatigue or weakness.  Some weight loss over the past 15 years. EYES: No blurred or double vision.  Wears glasses. EARS, NOSE, AND THROAT: No tinnitus or ear pain. No sore throat.  Always has a runny nose. RESPIRATORY: No cough, shortness of breath, wheezing or hemoptysis.  CARDIOVASCULAR: No chest pain, orthopnea, edema.  GASTROINTESTINAL: No nausea, vomiting.  Occasional abdominal pain.  Alternating diarrhea and constipation.  No blood in bowel movements GENITOURINARY: Recent procedure by Dr. Bernardo Heater and has a Foley in since. ENDOCRINE: No polyuria, nocturia,  HEMATOLOGY: No anemia, easy bruising or bleeding SKIN: No rash or lesion. MUSCULOSKELETAL: No joint pain or arthritis.   NEUROLOGIC: No tingling, numbness, weakness.  PSYCHIATRY: No anxiety or depression.   MEDICATIONS AT HOME:   Prior to Admission medications   Medication Sig Start Date End Date Taking? Authorizing Provider  acetaminophen (TYLENOL) 500 MG tablet Take 500 mg by mouth every 6 (six) hours as needed for moderate pain or headache.    [provider]  alendronate (FOSAMAX) 70 MG tablet TAKE 1 TABLET BY MOUTH EVERY 7 DAYS WITH A FULL GLASS OF WATER ON AN EMPTY STOMACH Patient taking differently: Take 70 mg by mouth once a week.  06/08/18   Copland, Frederico Hamman, MD  amiodarone (PACERONE) 200 MG tablet TAKE 1/2 TABLET BY MOUTH DAILY Patient taking differently: Take 100 mg by mouth at bedtime.  06/08/18   Deboraha Sprang, MD  clindamycin (CLEOCIN) 300 MG capsule  Take 600 mg by mouth See admin instructions. Take 600 mg by mouth 1 hour prior to dental procedure    [provider]  Cyanocobalamin (VITAMIN B-12 IJ) Inject 1,000 mcg as directed every 30 (thirty) days.     [provider]  docusate sodium (COLACE) 50 MG capsule Take 50 mg by mouth 2 (two) times daily as needed for mild constipation.    [provider]  dorzolamide-timolol (COSOPT) 22.3-6.8 MG/ML ophthalmic solution Place 1 drop into both eyes 2 (two) times daily.      [provider]  ferrous sulfate 325 (65 FE) MG EC tablet Take 325 mg by mouth daily with breakfast.      [provider]  furosemide (LASIX) 20 MG tablet Take 2 tablets (40 mg) by mouth once every other  day as directed 04/12/18   Copland, Frederico Hamman, MD  levothyroxine (SYNTHROID) 100 MCG tablet TAKE 1 TABLET BY MOUTH DAILY BEFORE BREAKFAST Patient taking differently: Take 100 mcg by mouth daily before breakfast. TAKE 1 TABLET BY MOUTH DAILY BEFORE BREAKFAST 08/17/18   Copland, Frederico Hamman, MD  loperamide (IMODIUM) 2 MG capsule Take 2 mg by mouth as needed for diarrhea or loose stools.    [provider]  Multiple Vitamin (MULTIVITAMIN) tablet Take 1 tablet by mouth daily.      [provider]  pantoprazole (PROTONIX) 40 MG tablet TAKE 1 TABLET(40 MG) BY MOUTH DAILY 10/03/18   Copland, Frederico Hamman, MD  polyethylene glycol (MIRALAX / GLYCOLAX) packet Take 17 g by mouth daily as needed for moderate constipation.     [provider]  pravastatin (PRAVACHOL) 40 MG tablet TAKE ONE-HALF TABLET BY MOUTH DAILY Patient taking differently: Take 20 mg by mouth at bedtime.  04/11/18   Copland, Frederico Hamman, MD  simethicone (MYLICON) 161 MG chewable tablet Chew 125 mg by mouth every 6 (six) hours as needed for flatulence.    [provider]  tamsulosin (FLOMAX) 0.4 MG CAPS capsule TAKE 1 CAPSULE(0.4 MG) BY MOUTH DAILY 01/16/19   Stoioff, Ronda Fairly, MD  warfarin (COUMADIN) 1 MG tablet TAKE  1 AND 1/2 TABLETS BY MOUTH DAILY AS DIRECTED 01/13/19   Deboraha Sprang, MD  Wheat Dextrin (BENEFIBER DRINK MIX PO) Take 1 scoop by mouth daily.     [provider]      VITAL SIGNS:  Blood pressure 122/76, pulse 65, temperature 97.8 F (36.6 C), temperature source Oral, resp. rate 16, SpO2 95 %.  PHYSICAL EXAMINATION:  GENERAL:  85 y.o.-year-old patient lying in the bed with no acute distress.  EYES: Pupils equal, round, reactive to light and accommodation. No scleral icterus. Extraocular muscles intact.  HEENT: Head atraumatic, normocephalic. Oropharynx and nasopharynx clear.  NECK:  Supple, no jugular venous distention. No thyroid enlargement, no tenderness.  LUNGS: Normal breath sounds bilaterally, no wheezing, rales,rhonchi or crepitation. No use of accessory muscles of respiration.  CARDIOVASCULAR: S1, S2 normal. No murmurs, rubs, or gallops.  ABDOMEN: Soft, nontender, nondistended. Bowel sounds present. No organomegaly or mass.  EXTREMITIES: No pedal edema, cyanosis, or clubbing.  NEUROLOGIC: Cranial nerves II through XII are intact. Muscle strength 5/5 in all extremities. Sensation intact. Gait not checked.  PSYCHIATRIC: The patient is alert and oriented x 3.  SKIN: No rash, lesion, or ulcer.   LABORATORY PANEL:   CBC Recent Labs  Lab 02/14/19 1135  WBC 6.5  HGB 10.9*  HCT 34.6*  PLT 167   ------------------------------------------------------------------------------------------------------------------  Chemistries  Recent Labs  Lab 02/14/19 1135  NA 137  K 5.0  CL 110  CO2 21*  GLUCOSE 104*  BUN 37*  CREATININE 2.15*  CALCIUM 9.0   ------------------------------------------------------------------------------------------------------------------    RADIOLOGY:  Dg Chest 1 View  Result Date: 02/14/2019 CLINICAL DATA:  Fall with hip fracture EXAM: CHEST  1 VIEW COMPARISON:  11/06/2016 FINDINGS: Left-sided pacing device as before. Suspected skin  fold artifact over the left lower chest. Hyperinflated lungs. No consolidation or pleural effusion. Stable cardiomediastinal silhouette. Aortic atherosclerosis. No pneumothorax. IMPRESSION: No active disease. Electronically Signed   By: Donavan Foil M.D.   On: 02/14/2019 15:33   Ct Head Wo Contrast  Result Date: 02/14/2019 CLINICAL DATA:  Ataxia after fall hitting head on side of a chair. Patient is anticoagulated. EXAM: CT HEAD WITHOUT CONTRAST TECHNIQUE: Contiguous axial images were obtained from  the base of the skull through the vertex without intravenous contrast. COMPARISON:  08/19/2005 FINDINGS: Brain: No evidence of acute infarction, hemorrhage, hydrocephalus, extra-axial collection or mass lesion/mass effect. Prominence of the sulci and ventricles noted. There is mild diffuse low-attenuation within the subcortical and periventricular white matter compatible with chronic microvascular disease. Vascular: No hyperdense vessel or unexpected calcification. Skull: Normal. Negative for fracture or focal lesion. Sinuses/Orbits: The paranasal sinuses are clear. Partial opacification of the mastoid air cells. Other: None IMPRESSION: 1. No acute intracranial abnormality. 2. Chronic small vessel ischemic change and brain atrophy. 3. Mild bilateral mastoid air cell effusions. Electronically Signed   By: Kerby Moors M.D.   On: 02/14/2019 12:07   Dg Hip Unilat W Or Wo Pelvis 2-3 Views Right  Result Date: 02/14/2019 CLINICAL DATA:  Right hip pain after fall EXAM: DG HIP (WITH OR WITHOUT PELVIS) 2-3V RIGHT COMPARISON:  CT 12/10/2016 FINDINGS: Catheter overlying the pelvis. Post treatment changes in the region of the prostate. Moderate arthritis of the left hip. Acute right femoral neck fracture with mild cephalad migration of the trochanter. IMPRESSION: Acute right femoral neck fracture Electronically Signed   By: Donavan Foil M.D.   On: 02/14/2019 15:31    EKG:   Interpreted by me.  Atrial paced at 61  bpm, Q waves septally.  IMPRESSION AND PLAN:   1.  Preoperative evaluation for right hip fracture, closed initial encounter.  Once INR less than 1.6 no contraindications to surgery at this time.  INR currently pending.  Surgery must be done to prevent death, pneumonia, skin breakdown and blood clot.  Patient understands the risks and is willing to undergo procedure in order to walk again. 2.  Paroxysmal atrial fibrillation.  Hold Coumadin.  Depending on INR may need a dose of vitamin K.  On low-dose amiodarone. 3.  Hyperlipidemia unspecified on pravastatin 4.  BPH on Flomax.  Recent urological procedure for stricture.  Has Foley and that supposed to come out tomorrow will likely need to be removed after surgery.  Would not treat urine analysis from Foley that is currently in because the patient is not septic. 5.  Hypothyroidism on levothyroxine 6.  Glaucoma on Cosopt 7.  Osteoporosis on Fosamax already. 8.  Vitamin B12 deficiency supposed to have a B12 injection soon. 9.  Patient hit his head with the fall.  CT scan of the head negative.  Watch mental status and neuro checks. 10.  Chronic kidney disease stage IIIb   All the records are reviewed and case discussed with ED provider. Management plans discussed with the patient, and he is in agreement.  I left a message for the patient's daughter.  CODE STATUS: DNR  TOTAL TIME TAKING CARE OF THIS PATIENT: 50 minutes.    Loletha Grayer M.D on 02/14/2019 at 4:30 PM  Between 7am to 6pm - Pager - [phone]  After 6pm call admission pager [phone]  [facility]  CC: Primary care physician; Owens Loffler, MD

## 2019-02-14 NOTE — ED Notes (Signed)
EMS pt to lobby via wheelchair, from home fall upper lef pain / skin tear to right elbow , on blood thinners . 152/90, HR 80 , 99% RA,

## 2019-02-14 NOTE — ED Notes (Signed)
Verbal order for 4mg  morphine IV given by Dr. Joni Fears

## 2019-02-14 NOTE — ED Notes (Signed)
Blue top sent to lab. 

## 2019-02-14 NOTE — Consult Note (Signed)
Reason for Consult: Right femoral neck fracture displaced Referring Physician: Dr. Delma Officer [name] [name] is an 85 y.o. male.  HPI: Patient suffered a fall this morning after getting tripped up on getting out of bed.  He suffered a fall and had right hip pain.  Subsequent was brought to the emergency room and found to have a displaced femoral neck fracture.  He has been made for this.  He normally is a pretty much household ambulator gets to the end of his driveway and does not really doing more walking beyond that.  He does not use assistive device.  He has significant cardiac limitations for his activities.  He denies shortness of breath with ambulation.  He denies prodromal symptoms of right groin or hip pain prior to this fall.  Past Medical History:  Diagnosis Date  . AICD (automatic cardioverter/defibrillator) present    PACEMAKER  . Anemia   . Cancer ([place])   . Cardiac pacemaker in situ 07/2005   symptomatic bradycardia  . CKD (chronic kidney disease) stage 3, GFR 30-59 ml/min 04/13/2017  . Diverticulosis of colon (without mention of hemorrhage)   . Dysrhythmia   . 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 GERD (gastroesophageal reflux disease)   . GI hemorrhage   . Hyperlipidemia   . Hypertension   . Lower extremity edema   . Neck arthritis, Severe, multi-level 06/09/2013  . Paroxysmal atrial fibrillation ([name])    s/p failed ablation  . Peptic ulcer, unspecified site, unspecified as acute or chronic, without mention of hemorrhage, perforation, or obstruction 1975  . Personal history of malignant neoplasm of prostate 12/2000   5 weeks radiation, radiation seeds  . Radiation proctitis   . Sinoatrial node dysfunction ([name])   . Unspecified glaucoma(365.9)   . Unspecified hypothyroidism   . Vitamin B12 deficiency     Past Surgical History:  Procedure Laterality Date  . CARDIAC CATHETERIZATION    . CARDIAC  ELECTROPHYSIOLOGY [place] AND ABLATION  2001, 2003   failure  . CATARACT EXTRACTION    . CYSTOGRAM N/A 09/21/2018   Procedure: CYSTOGRAM;  Surgeon: Abbie Sons, MD;  Location: [facility];  Service: Urology;  Laterality: N/A;  . CYSTOSCOPY WITH URETHRAL DILATATION N/A 01/29/2017   Procedure: CYSTOSCOPY WITH URETHRAL DILATATION;  Surgeon: Abbie Sons, MD;  Location: [facility];  Service: Urology;  Laterality: N/A;  . CYSTOSCOPY WITH URETHRAL DILATATION N/A 09/21/2018   Procedure: CYSTOSCOPY WITH URETHRAL DILATATION;  Surgeon: Abbie Sons, MD;  Location: [facility];  Service: Urology;  Laterality: N/A;  . EP IMPLANTABLE DEVICE N/A 02/24/2016   Procedure: PPM Generator Changeout;  Surgeon: Deboraha Sprang, MD;  Location: [place] [facility];  Service: Cardiovascular;  Laterality: N/A;  . gastric ulcer surgery    . HERNIA REPAIR  1994, 2001, 2003   s/p drainage and complications, 0/8144, 10/1854 mesh removal  . INSERT / REPLACE / REMOVE PACEMAKER  07/2005   symptomatic bradycardia  . kidney stones    . NOSE SURGERY     cancer removed   . PROSTATE SURGERY    . SKIN CANCER DESTRUCTION    . TOOTH EXTRACTION      Family History  Problem Relation Age of Onset  . Breast cancer Mother   . Bone cancer Father   . Alcohol abuse Other   . Coronary artery disease Other   . Dementia Other   .  Diabetes Other     Social History:  reports that he has never smoked. He has never used smokeless tobacco. He reports that he does not drink alcohol or use drugs.  Allergies:  Allergies  Allergen Reactions  . Penicillins Rash and Other (See Comments)    Has patient had a PCN reaction causing immediate rash, facial/tongue/throat swelling, SOB or lightheadedness with hypotension: {no Has patient had a PCN reaction causing severe rash involving mucus membranes or skin necrosis: {no Has patient had a PCN reaction that required hospitalization {no Has patient had a PCN reaction occurring within the last 10  years: {no If all of the above answers are "NO", then may proceed with Cephalosporin use.    Medications: I have reviewed the patient's current medications.  Results for orders placed or performed during the hospital encounter of 02/14/19 (from the past 48 hour(s))  Basic metabolic panel     Status: Abnormal   Collection Time: 02/14/19 11:35 AM  Result Value Ref Range   Sodium 137 135 - 145 mmol/L   Potassium 5.0 3.5 - 5.1 mmol/L   Chloride 110 98 - 111 mmol/L   CO2 21 (L) 22 - 32 mmol/L   Glucose, Bld 104 (H) 70 - 99 mg/dL   BUN 37 (H) 8 - 23 mg/dL   Creatinine, Ser 2.15 (H) 0.61 - 1.24 mg/dL   Calcium 9.0 8.9 - 10.3 mg/dL   GFR calc non Af Amer 26 (L) >60 mL/min   GFR calc Af Amer 30 (L) >60 mL/min   Anion gap 6 5 - 15    Comment: Performed at [facility], [address] [place], [place] [address]  CBC     Status: Abnormal   Collection Time: 02/14/19 11:35 AM  Result Value Ref Range   WBC 6.5 4.0 - 10.5 K/uL   RBC 3.42 (L) 4.22 - 5.81 MIL/uL   Hemoglobin 10.9 (L) 13.0 - 17.0 g/dL   HCT 34.6 (L) 39.0 - 52.0 %   MCV 101.2 (H) 80.0 - 100.0 fL   MCH 31.9 26.0 - 34.0 pg   MCHC 31.5 30.0 - 36.0 g/dL   RDW 12.9 11.5 - 15.5 %   Platelets 167 150 - 400 K/uL   nRBC 0.0 0.0 - 0.2 %    Comment: Performed at [facility], [address] [place], [place] [address]  Urinalysis, Complete w Microscopic     Status: Abnormal   Collection Time: 02/14/19 11:41 AM  Result Value Ref Range   Color, Urine YELLOW (A) YELLOW   APPearance CLEAR (A) CLEAR   Specific Gravity, Urine 1.015 1.005 - 1.030   pH 5.0 5.0 - 8.0   Glucose, UA NEGATIVE NEGATIVE mg/dL   Hgb urine dipstick MODERATE (A) NEGATIVE   Bilirubin Urine NEGATIVE NEGATIVE   Ketones, ur NEGATIVE NEGATIVE mg/dL   Protein, ur 100 (A) NEGATIVE mg/dL   Nitrite NEGATIVE NEGATIVE   Leukocytes,Ua LARGE (A) NEGATIVE   RBC / HPF 21-50 0 - 5 RBC/hpf   WBC, UA >50 (H) 0 - 5 WBC/hpf   Bacteria, UA RARE (A) NONE SEEN    Squamous Epithelial / LPF 0-5 0 - 5   Mucus PRESENT     Comment: Performed at [facility], [address] [place], [place] [address]  Protime-INR     Status: Abnormal   Collection Time: 02/14/19  4:48 PM  Result Value Ref Range   Prothrombin Time 34.9 (H) 11.4 - 15.2 seconds   INR  3.5 (H) 0.8 - 1.2    Comment: (NOTE) INR goal varies based on device and disease states. Performed at [facility], [address] [place], [place] [address]     Dg Chest 1 View  Result Date: 02/14/2019 CLINICAL DATA:  Fall with hip fracture EXAM: CHEST  1 VIEW COMPARISON:  11/06/2016 FINDINGS: Left-sided pacing device as before. Suspected skin fold artifact over the left lower chest. Hyperinflated lungs. No consolidation or pleural effusion. Stable cardiomediastinal silhouette. Aortic atherosclerosis. No pneumothorax. IMPRESSION: No active disease. Electronically Signed   By: Donavan Foil M.D.   On: 02/14/2019 15:33   Ct Head Wo Contrast  Result Date: 02/14/2019 CLINICAL DATA:  Ataxia after fall hitting head on side of a chair. Patient is anticoagulated. EXAM: CT HEAD WITHOUT CONTRAST TECHNIQUE: Contiguous axial images were obtained from the base of the skull through the vertex without intravenous contrast. COMPARISON:  08/19/2005 FINDINGS: Brain: No evidence of acute infarction, hemorrhage, hydrocephalus, extra-axial collection or mass lesion/mass effect. Prominence of the sulci and ventricles noted. There is mild diffuse low-attenuation within the subcortical and periventricular white matter compatible with chronic microvascular disease. Vascular: No hyperdense vessel or unexpected calcification. Skull: Normal. Negative for fracture or focal lesion. Sinuses/Orbits: The paranasal sinuses are clear. Partial opacification of the mastoid air cells. Other: None IMPRESSION: 1. No acute intracranial abnormality. 2. Chronic small vessel ischemic change and brain atrophy. 3. Mild bilateral mastoid  air cell effusions. Electronically Signed   By: Kerby Moors M.D.   On: 02/14/2019 12:07   Dg Hip Unilat W Or Wo Pelvis 2-3 Views Right  Result Date: 02/14/2019 CLINICAL DATA:  Right hip pain after fall EXAM: DG HIP (WITH OR WITHOUT PELVIS) 2-3V RIGHT COMPARISON:  CT 12/10/2016 FINDINGS: Catheter overlying the pelvis. Post treatment changes in the region of the prostate. Moderate arthritis of the left hip. Acute right femoral neck fracture with mild cephalad migration of the trochanter. IMPRESSION: Acute right femoral neck fracture Electronically Signed   By: Donavan Foil M.D.   On: 02/14/2019 15:31    ROS Blood pressure (!) 142/86, pulse 75, temperature 98.3 F (36.8 C), temperature source Oral, resp. rate 16, SpO2 97 %. Physical Exam right leg is shortened and externally rotated with trace dorsalis pedis pulse.  He is able to flex extend the toes sensation intact.  Radiographic review shows no significant degenerative changes to the right hip with a completely displaced femoral neck fracture with shortening of the femur  Assessment/Plan: Displaced femoral neck fracture and minimally ambulatory 85 year old with significant heart issues.  Plan will be for a right hip hemiarthroplasty cemented when INR is better.  Hessie Knows 02/14/2019, 5:49 PM

## 2019-02-14 NOTE — ED Notes (Signed)
Patient given sandwich tray and ginger ale

## 2019-02-14 NOTE — ED Notes (Signed)
Daughter of patient  Chrissie Noa  [phone]

## 2019-02-14 NOTE — Progress Notes (Signed)
Remote pacemaker transmission.   

## 2019-02-14 NOTE — ED Provider Notes (Signed)
[facility] Emergency Department Provider Note  ____________________________________________  Time seen: Approximately 2:14 PM  I have reviewed the triage vital signs and the nursing notes.   HISTORY  Chief Complaint Fall    HPI [name] [name] is a 85 y.o. male with a history of AICD, CKD, GERD, hypertension, peptic ulcer disease  who complains of right hip pain since having a fall at home today.  He was trying to prepare some instant coffee when his feet got tangled over each other and he fell onto his right side.  This caused immediate right hip pain, nonradiating, worse with movement and leg rotation.  Worse with trying to stand up and bear weight which she feels unable to do.  Alleviated by sitting and being still.  He does report that he hit the side of his head on the ground but denies any headache vision change neck pain loss of consciousness or dizziness.  No recent illness.  He is on Coumadin. Patient has a Foley catheter in place which is due to be removed tomorrow in the urology clinic appointment that he has.  He is also been on antibiotics for UTI, last dose today.  Denies any lower abdominal pain or bladder spasticity symptoms.  No cloudy urine output.  No fever or body aches malaise or fatigue.   Past Medical History:  Diagnosis Date  . AICD (automatic cardioverter/defibrillator) present    PACEMAKER  . Anemia   . Cancer ([place])   . Cardiac pacemaker in situ 07/2005   symptomatic bradycardia  . CKD (chronic kidney disease) stage 3, GFR 30-59 ml/min 04/13/2017  . Diverticulosis of colon (without mention of hemorrhage)   . Dysrhythmia   . 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 GERD (gastroesophageal reflux disease)   . GI hemorrhage   . Hyperlipidemia   . Hypertension   . Lower extremity edema   . Neck arthritis, Severe, multi-level 06/09/2013  . Paroxysmal atrial fibrillation  ([name])    s/p failed ablation  . Peptic ulcer, unspecified site, unspecified as acute or chronic, without mention of hemorrhage, perforation, or obstruction 1975  . Personal history of malignant neoplasm of prostate 12/2000   5 weeks radiation, radiation seeds  . Radiation proctitis   . Sinoatrial node dysfunction ([name])   . Unspecified glaucoma(365.9)   . Unspecified hypothyroidism   . Vitamin B12 deficiency      Patient Active Problem List   Diagnosis Date Noted  . CKD (chronic kidney disease) stage 3, GFR 30-59 ml/min 04/13/2017  . DDD (degenerative disc disease), cervical, Severe 06/09/2013  . Neck arthritis, Severe, multi-level 06/09/2013  . Sinoatrial node dysfunction ([name])   . Long term (current) use of anticoagulants 06/24/2010  . IRON DEFICIENCY 04/17/2010  . GLAUCOMA 08/07/2008  . GERD 08/07/2008  . PACEMAKER, PERMANENT 08/07/2008  . Vitamin B 12 deficiency 07/19/2008  . PEPTIC ULCER DISEASE 07/18/2008  . RADIATION PROCTITIS 07/18/2008  . DIVERTICULOSIS, COLON 07/18/2008  . Prostate cancer, in remission 07/18/2008  . Essential hypertension 05/09/2008  . Hyperlipidemia 02/06/2008  . PAROXYSMAL ATRIAL FIBRILLATION 02/06/2008  . Hypothyroidism 12/01/2007  . HERN UNS SITE ABD CAV W/O MENTION OBST/GANGREN 12/01/2007     Past Surgical History:  Procedure Laterality Date  . CARDIAC CATHETERIZATION    . CARDIAC ELECTROPHYSIOLOGY [place] AND ABLATION  2001, 2003   failure  . CATARACT EXTRACTION    . CYSTOGRAM N/A 09/21/2018  Procedure: CYSTOGRAM;  Surgeon: Abbie Sons, MD;  Location: [facility] ORS;  Service: Urology;  Laterality: N/A;  . CYSTOSCOPY WITH URETHRAL DILATATION N/A 01/29/2017   Procedure: CYSTOSCOPY WITH URETHRAL DILATATION;  Surgeon: Abbie Sons, MD;  Location: [facility] ORS;  Service: Urology;  Laterality: N/A;  . CYSTOSCOPY WITH URETHRAL DILATATION N/A 09/21/2018   Procedure: CYSTOSCOPY WITH URETHRAL DILATATION;  Surgeon: Abbie Sons, MD;  Location:  [facility] ORS;  Service: Urology;  Laterality: N/A;  . EP IMPLANTABLE DEVICE N/A 02/24/2016   Procedure: PPM Generator Changeout;  Surgeon: Deboraha Sprang, MD;  Location: [place] [facility];  Service: Cardiovascular;  Laterality: N/A;  . gastric ulcer surgery    . HERNIA REPAIR  1994, 2001, 2003   s/p drainage and complications, 03/7492, 06/9673 mesh removal  . INSERT / REPLACE / REMOVE PACEMAKER  07/2005   symptomatic bradycardia  . kidney stones    . NOSE SURGERY     cancer removed   . PROSTATE SURGERY    . SKIN CANCER DESTRUCTION    . TOOTH EXTRACTION       Prior to Admission medications   Medication Sig Start Date End Date Taking? Authorizing Provider  acetaminophen (TYLENOL) 500 MG tablet Take 500 mg by mouth every 6 (six) hours as needed for moderate pain or headache.    [provider]  alendronate (FOSAMAX) 70 MG tablet TAKE 1 TABLET BY MOUTH EVERY 7 DAYS WITH A FULL GLASS OF WATER ON AN EMPTY STOMACH Patient taking differently: Take 70 mg by mouth once a week.  06/08/18   Copland, Frederico Hamman, MD  amiodarone (PACERONE) 200 MG tablet TAKE 1/2 TABLET BY MOUTH DAILY Patient taking differently: Take 100 mg by mouth at bedtime.  06/08/18   Deboraha Sprang, MD  clindamycin (CLEOCIN) 300 MG capsule Take 600 mg by mouth See admin instructions. Take 600 mg by mouth 1 hour prior to dental procedure    [provider]  Cyanocobalamin (VITAMIN B-12 IJ) Inject 1,000 mcg as directed every 30 (thirty) days.     [provider]  docusate sodium (COLACE) 50 MG capsule Take 50 mg by mouth 2 (two) times daily as needed for mild constipation.    [provider]  dorzolamide-timolol (COSOPT) 22.3-6.8 MG/ML ophthalmic solution Place 1 drop into both eyes 2 (two) times daily.      [provider]  ferrous sulfate 325 (65 FE) MG EC tablet Take 325 mg by mouth daily with breakfast.      [provider]  furosemide (LASIX) 20 MG tablet Take 2 tablets (40 mg) by  mouth once every other day as directed 04/12/18   Copland, Frederico Hamman, MD  levothyroxine (SYNTHROID) 100 MCG tablet TAKE 1 TABLET BY MOUTH DAILY BEFORE BREAKFAST Patient taking differently: Take 100 mcg by mouth daily before breakfast. TAKE 1 TABLET BY MOUTH DAILY BEFORE BREAKFAST 08/17/18   Copland, Frederico Hamman, MD  loperamide (IMODIUM) 2 MG capsule Take 2 mg by mouth as needed for diarrhea or loose stools.    [provider]  Multiple Vitamin (MULTIVITAMIN) tablet Take 1 tablet by mouth daily.      [provider]  pantoprazole (PROTONIX) 40 MG tablet TAKE 1 TABLET(40 MG) BY MOUTH DAILY 10/03/18   Copland, Frederico Hamman, MD  polyethylene glycol (MIRALAX / GLYCOLAX) packet Take 17 g by mouth daily as needed for moderate constipation.     [provider]  pravastatin (PRAVACHOL) 40 MG tablet TAKE ONE-HALF TABLET BY MOUTH DAILY Patient  taking differently: Take 20 mg by mouth at bedtime.  04/11/18   Copland, Frederico Hamman, MD  simethicone (MYLICON) 882 MG chewable tablet Chew 125 mg by mouth every 6 (six) hours as needed for flatulence.    [provider]  tamsulosin (FLOMAX) 0.4 MG CAPS capsule TAKE 1 CAPSULE(0.4 MG) BY MOUTH DAILY 01/16/19   Stoioff, Ronda Fairly, MD  warfarin (COUMADIN) 1 MG tablet TAKE 1 AND 1/2 TABLETS BY MOUTH DAILY AS DIRECTED 01/13/19   Deboraha Sprang, MD  Wheat Dextrin (BENEFIBER DRINK MIX PO) Take 1 scoop by mouth daily.     [provider]     Allergies Penicillins   Family History  Problem Relation Age of Onset  . Breast cancer Mother   . Bone cancer Father   . Alcohol abuse Other   . Coronary artery disease Other   . Dementia Other   . Diabetes Other     Social History Social History   Tobacco Use  . Smoking status: Never Smoker  . Smokeless tobacco: Never Used  Substance Use Topics  . Alcohol use: No  . Drug use: No    Review of Systems  Constitutional:   No fever or chills.  ENT:   No sore throat. No  rhinorrhea. Cardiovascular:   No chest pain or syncope. Respiratory:   No dyspnea or cough. Gastrointestinal:   Negative for abdominal pain, vomiting and diarrhea.  Musculoskeletal:   Positive right hip pain as above. All other systems reviewed and are negative except as documented above in ROS and HPI.  ____________________________________________   PHYSICAL EXAM:  VITAL SIGNS: ED Triage Vitals [02/14/19 1132]  Enc Vitals Group     BP 122/76     Pulse Rate 65     Resp 16     Temp 97.8 F (36.6 C)     Temp Source Oral     SpO2 95 %     Weight      Height      Head Circumference      Peak Flow      Pain Score      Pain Loc      Pain Edu?      Excl. in [place]?     Vital signs reviewed, nursing assessments reviewed.   Constitutional:   Alert and oriented. Non-toxic appearance. Eyes:   Conjunctivae are normal. EOMI. PERRL. ENT      Head:   Normocephalic and atraumatic.      Nose:   Wearing a mask.      Mouth/Throat:   Wearing a mask.      Neck:   No meningismus. Full ROM.  No midline spinal tenderness Hematological/Lymphatic/Immunilogical:   No cervical lymphadenopathy. Cardiovascular:   RRR. Symmetric bilateral radial and DP pulses.  No murmurs. Cap refill less than 2 seconds. Respiratory:   Normal respiratory effort without tachypnea/retractions. Breath sounds are clear and equal bilaterally. No wheezes/rales/rhonchi. Gastrointestinal:   Soft and nontender.  No suprapubic tenderness.  Urinary catheter in place.  Non distended. There is no CVA tenderness.  No rebound, rigidity, or guarding. Musculoskeletal:   Tenderness at the right femoral neck.  Otherwise no long bone tenderness, joints are stable.  Pelvis is stable. Neurologic:   Normal speech and language.  Motor grossly intact. No acute focal neurologic deficits are appreciated.  Skin:    Skin is warm, dry and intact. No rash noted.  No petechiae, purpura, or bullae.  ____________________________________________     LABS (pertinent  positives/negatives) (all labs ordered are listed, but only abnormal results are displayed) Labs Reviewed  BASIC METABOLIC PANEL - Abnormal; Notable for the following components:      Result Value   CO2 21 (*)    Glucose, Bld 104 (*)    BUN 37 (*)    Creatinine, Ser 2.15 (*)    GFR calc non Af Amer 26 (*)    GFR calc Af Amer 30 (*)    All other components within normal limits  CBC - Abnormal; Notable for the following components:   RBC 3.42 (*)    Hemoglobin 10.9 (*)    HCT 34.6 (*)    MCV 101.2 (*)    All other components within normal limits  URINALYSIS, COMPLETE (UACMP) WITH MICROSCOPIC - Abnormal; Notable for the following components:   Color, Urine YELLOW (*)    APPearance CLEAR (*)    Hgb urine dipstick MODERATE (*)    Protein, ur 100 (*)    Leukocytes,Ua LARGE (*)    WBC, UA >50 (*)    Bacteria, UA RARE (*)    All other components within normal limits  URINE CULTURE  SARS CORONAVIRUS 2 (TAT 6-24 HRS)  PROTIME-INR  CBG MONITORING, ED   ____________________________________________   EKG  Interpreted by me Atrial paced rhythm, rate of 61.  First-degree AV block.  Poor R wave progression.  No acute ischemic changes.  ____________________________________________    RADIOLOGY  Dg Chest 1 View  Result Date: 02/14/2019 CLINICAL DATA:  Fall with hip fracture EXAM: CHEST  1 VIEW COMPARISON:  11/06/2016 FINDINGS: Left-sided pacing device as before. Suspected skin fold artifact over the left lower chest. Hyperinflated lungs. No consolidation or pleural effusion. Stable cardiomediastinal silhouette. Aortic atherosclerosis. No pneumothorax. IMPRESSION: No active disease. Electronically Signed   By: Donavan Foil M.D.   On: 02/14/2019 15:33   Ct Head Wo Contrast  Result Date: 02/14/2019 CLINICAL DATA:  Ataxia after fall hitting head on side of a chair. Patient is anticoagulated. EXAM: CT HEAD WITHOUT CONTRAST TECHNIQUE: Contiguous axial images were  obtained from the base of the skull through the vertex without intravenous contrast. COMPARISON:  08/19/2005 FINDINGS: Brain: No evidence of acute infarction, hemorrhage, hydrocephalus, extra-axial collection or mass lesion/mass effect. Prominence of the sulci and ventricles noted. There is mild diffuse low-attenuation within the subcortical and periventricular white matter compatible with chronic microvascular disease. Vascular: No hyperdense vessel or unexpected calcification. Skull: Normal. Negative for fracture or focal lesion. Sinuses/Orbits: The paranasal sinuses are clear. Partial opacification of the mastoid air cells. Other: None IMPRESSION: 1. No acute intracranial abnormality. 2. Chronic small vessel ischemic change and brain atrophy. 3. Mild bilateral mastoid air cell effusions. Electronically Signed   By: Kerby Moors M.D.   On: 02/14/2019 12:07   Dg Hip Unilat W Or Wo Pelvis 2-3 Views Right  Result Date: 02/14/2019 CLINICAL DATA:  Right hip pain after fall EXAM: DG HIP (WITH OR WITHOUT PELVIS) 2-3V RIGHT COMPARISON:  CT 12/10/2016 FINDINGS: Catheter overlying the pelvis. Post treatment changes in the region of the prostate. Moderate arthritis of the left hip. Acute right femoral neck fracture with mild cephalad migration of the trochanter. IMPRESSION: Acute right femoral neck fracture Electronically Signed   By: Donavan Foil M.D.   On: 02/14/2019 15:31    ____________________________________________   PROCEDURES Procedures  ____________________________________________  DIFFERENTIAL DIAGNOSIS   Intracranial hemorrhage, hip fracture, dehydration, electrolyte abnormality, anemia  CLINICAL IMPRESSION / ASSESSMENT AND PLAN / ED COURSE  Medications  ordered in the ED: Medications - No data to display  Pertinent labs & imaging results that were available during my care of the patient were reviewed by me and considered in my medical decision making (see chart for details).  [name] [name] was evaluated in Emergency Department on 02/14/2019 for the symptoms described in the history of present illness. He was evaluated in the context of the global COVID-19 pandemic, which necessitated consideration that the patient might be at risk for infection with the SARS-CoV-2 virus that causes COVID-19. Institutional protocols and algorithms that pertain to the evaluation of patients at risk for COVID-19 are in a state of rapid change based on information released by regulatory bodies including the CDC and federal and state organizations. These policies and algorithms were followed during the patient's care in the ED.   Patient presents with right hip pain after mechanical fall at home.  Vital signs are normal.  Labs are reassuring and at baseline.  Urinalysis shows inflammatory changes which are also chronic compared to previous.  Given that he has an indwelling Foley, has no specific urinary symptoms, will defer on starting antibiotics since he is currently asymptomatic.  I will add on a urine culture.  Allow him to follow-up with urology to continue assessing whether or not antibiotics would be appropriate.  CT head is negative, without significant traumatic injury.  Neck is nontender and he has been mobile since the fall, no benefit to CT imaging at this time, chance of a significant neck injury is remote.  Will need to do a x-ray of the right hip to evaluate for hip fracture given patient's pain after fall and inability to bear weight.  If x-ray is negative will need to proceed with CT.   ----------------------------------------- 3:42 PM on 02/14/2019 -----------------------------------------  X-ray demonstrates a mildly displaced right femoral neck fracture.  Discussed with orthopedics Dr. Rudene Christians who will likely need to schedule surgery a few days out from now.  Will admit to hospitalist.  Covid screening ordered     ____________________________________________   FINAL CLINICAL  IMPRESSION(S) / ED DIAGNOSES    Final diagnoses:  Closed fracture of neck of right femur, initial encounter ([place])  Warfarin anticoagulation  Longstanding persistent atrial fibrillation [facility])     ED Discharge Orders    None      Portions of this note were generated with dragon dictation software. Dictation errors may occur despite best attempts at proofreading.   Carrie Mew, MD 02/14/19 1544

## 2019-02-14 NOTE — ED Notes (Signed)
Attempted to call report at this time 

## 2019-02-15 ENCOUNTER — Other Ambulatory Visit: Payer: Self-pay

## 2019-02-15 ENCOUNTER — Ambulatory Visit: Payer: Medicare Other | Admitting: Physician Assistant

## 2019-02-15 LAB — BASIC METABOLIC PANEL
Anion gap: 9 (ref 5–15)
BUN: 35 mg/dL — ABNORMAL HIGH (ref 8–23)
CO2: 20 mmol/L — ABNORMAL LOW (ref 22–32)
Calcium: 8.3 mg/dL — ABNORMAL LOW (ref 8.9–10.3)
Chloride: 108 mmol/L (ref 98–111)
Creatinine, Ser: 2.08 mg/dL — ABNORMAL HIGH (ref 0.61–1.24)
GFR calc Af Amer: 31 mL/min — ABNORMAL LOW (ref >60)
GFR calc non Af Amer: 27 mL/min — ABNORMAL LOW (ref >60)
Glucose, Bld: 86 mg/dL (ref 70–99)
Potassium: 4.6 mmol/L (ref 3.5–5.1)
Sodium: 137 mmol/L (ref 135–145)

## 2019-02-15 LAB — CBC
HCT: 29.3 % — ABNORMAL LOW (ref 39.0–52.0)
Hemoglobin: 9.3 g/dL — ABNORMAL LOW (ref 13.0–17.0)
MCH: 32 pg (ref 26.0–34.0)
MCHC: 31.7 g/dL (ref 30.0–36.0)
MCV: 100.7 fL — ABNORMAL HIGH (ref 80.0–100.0)
Platelets: 151 10*3/uL (ref 150–400)
RBC: 2.91 MIL/uL — ABNORMAL LOW (ref 4.22–5.81)
RDW: 12.9 % (ref 11.5–15.5)
WBC: 6.3 10*3/uL (ref 4.0–10.5)
nRBC: 0 % (ref 0.0–0.2)

## 2019-02-15 LAB — PROTIME-INR
INR: 4.4 (ref 0.8–1.2)
INR: 4.6 (ref 0.8–1.2)
Prothrombin Time: 42.4 seconds — ABNORMAL HIGH (ref 11.4–15.2)
Prothrombin Time: 43.3 seconds — ABNORMAL HIGH (ref 11.4–15.2)

## 2019-02-15 LAB — URINE CULTURE: Culture: <10000 — AB

## 2019-02-15 LAB — SARS CORONAVIRUS 2 (TAT 6-24 HRS): SARS Coronavirus 2: NEGATIVE

## 2019-02-15 MED ORDER — CHLORHEXIDINE GLUCONATE CLOTH 2 % EX PADS
6.0000 | MEDICATED_PAD | Freq: Every day | CUTANEOUS | Status: DC
  Administered 2019-02-15 – 2019-02-20 (×6): 6 via TOPICAL

## 2019-02-15 MED ORDER — PHYTONADIONE 5 MG PO TABS
5.0000 mg | ORAL_TABLET | Freq: Once | ORAL | Status: AC
  Administered 2019-02-15: 5 mg via ORAL
  Filled 2019-02-15: qty 1

## 2019-02-15 MED ORDER — VITAMIN K1 10 MG/ML IJ SOLN
5.0000 mg | Freq: Once | INTRAVENOUS | Status: AC
  Administered 2019-02-15: 5 mg via INTRAVENOUS
  Filled 2019-02-15: qty 0.5

## 2019-02-15 NOTE — Plan of Care (Signed)
  Problem: Nutrition: Goal: Adequate nutrition will be maintained Outcome: Completed/Met

## 2019-02-15 NOTE — Progress Notes (Signed)
PROGRESS NOTE    [name] [name]  NIO:[id number] DOB: 21-Sep-1922 DOA: 02/14/2019 PCP: Owens Loffler, MD      Brief Narrative:  [name] is a 85 y.o. M with hx pAF on amiodarone and warfarin, bradycardia s/p PPM, CKD IV baseline 1.9-2.2, prostate Ca remote, who presented with fall and right hip pain.  In the ER, radiographs showed evidence of right hip fracture.  Orthopedics were consulted and recommended operative repair.      Assessment & Plan:  Acute right femoral neck fracture INR still elevated.  Vitamin K not given overnight. -Give vitamin K -Repeat INR this afternoon, if INR still elevated greater than 1.6, will repeat vitamin K this evening -Consult orthopedics, appreciate recommendations    Atrial fibrillation, paroxysmal Pacemaker in place -Continue amiodarone -Hold warfarin for surgery -Give vitamin K and repeat INR this afternoon    Chronic kidney disease stage IV Creatinine stable relative to baseline -Continue home furosemide -Continue pravastatin  Anemia of chronic kidney disease Hemoglobin stable relative to baseline -Continue oral iron  Hypothyroidism -Continue home levothyroxine  BPH -Continue home Flomax  GERD -Continue home pantoprazole        MDM and disposition: The below labs and imaging reports were reviewed and summarized above.  Medication management as above.  The patient was admitted with acute right hip fracture.    He will need operative fixation since his INR is corrected.  Subsequently will plan for PT evaluation and likely SNF.      DVT prophylaxis: SCDs Code Status: DNR Family Communication:     Consultants:   Orthopedics  Procedures:     Antimicrobials:       Subjective: Patient has had pain in his right hip, no fever, dyspnea, chest pain, or palpitations.  Objective: Vitals:   02/14/19 1931 02/14/19 2049 02/15/19 0600 02/15/19 1531  BP: (!) 150/90 132/67  110/70  Pulse: 85 (!) 103   60  Resp: 16 17  18   Temp:  98.2 F (36.8 C)  98 F (36.7 C)  TempSrc:      SpO2: 98% 90%  97%  Weight:   81.2 kg     Intake/Output Summary (Last 24 hours) at 02/15/2019 1556 Last data filed at 02/15/2019 1531 Gross per 24 hour  Intake 810 ml  Output 850 ml  Net -40 ml   Filed Weights   02/15/19 0600  Weight: 81.2 kg    Examination: General appearance: Thin elderly adult male, alert and in no acute distress.   HEENT: Anicteric, conjunctiva pink, lids and lashes normal. No nasal deformity, discharge, epistaxis.  Lips moist, dentition normal, oropharynx moist, no oral lesions, hearing normal.   Skin: Warm and dry.  No jaundice.  No suspicious rashes or lesions. Cardiac: Rate normal, rhythm irregular, nl S1-S2, no murmurs appreciated.  Capillary refill is brisk.  JVP normal.  No LE edema.  Radial pulses 2+ and symmetric. Respiratory: Normal respiratory rate and rhythm.  CTAB without rales or wheezes. Abdomen: Abdomen soft.  No TTP or guarding. No ascites, distension, hepatosplenomegaly.   MSK: No deformities or effusions.  Diffuse loss of subcutaneous muscle mass and fat. Neuro: Awake and alert.  EOMI, moves upper extremities with normal strength and coordination, lower leg extremity strength not tested. Speech fluent.    Psych: Sensorium intact and responding to questions, attention normal. Affect normal.  Judgment and insight appear normal.    Data Reviewed: I have personally reviewed following labs and imaging studies:  CBC: Recent Labs  Lab 02/14/19 1135 02/15/19 0420  WBC 6.5 6.3  HGB 10.9* 9.3*  HCT 34.6* 29.3*  MCV 101.2* 100.7*  PLT 167 379   Basic Metabolic Panel: Recent Labs  Lab 02/14/19 1135 02/15/19 0420  NA 137 137  K 5.0 4.6  CL 110 108  CO2 21* 20*  GLUCOSE 104* 86  BUN 37* 35*  CREATININE 2.15* 2.08*  CALCIUM 9.0 8.3*   GFR: Estimated Creatinine Clearance: 23.6 mL/min (A) (by C-G formula based on SCr of 2.08 mg/dL (H)). Liver Function  Tests: No results for input(s): AST, ALT, ALKPHOS, BILITOT, PROT, ALBUMIN in the last 168 hours. No results for input(s): LIPASE, AMYLASE in the last 168 hours. No results for input(s): AMMONIA in the last 168 hours. Coagulation Profile: Recent Labs  Lab 02/14/19 1648 02/15/19 0420  INR 3.5* 4.4*   Cardiac Enzymes: No results for input(s): CKTOTAL, CKMB, CKMBINDEX, TROPONINI in the last 168 hours. BNP (last 3 results) No results for input(s): PROBNP in the last 8760 hours. HbA1C: No results for input(s): HGBA1C in the last 72 hours. CBG: No results for input(s): GLUCAP in the last 168 hours. Lipid Profile: No results for input(s): CHOL, HDL, LDLCALC, TRIG, CHOLHDL, LDLDIRECT in the last 72 hours. Thyroid Function Tests: No results for input(s): TSH, T4TOTAL, FREET4, T3FREE, THYROIDAB in the last 72 hours. Anemia Panel: No results for input(s): VITAMINB12, FOLATE, FERRITIN, TIBC, IRON, RETICCTPCT in the last 72 hours. Urine analysis:    Component Value Date/Time   COLORURINE YELLOW (A) 02/14/2019 1141   APPEARANCEUR CLEAR (A) 02/14/2019 1141   APPEARANCEUR Cloudy (A) 02/10/2019 1341   LABSPEC 1.015 02/14/2019 1141   PHURINE 5.0 02/14/2019 1141   GLUCOSEU NEGATIVE 02/14/2019 1141   HGBUR MODERATE (A) 02/14/2019 1141   BILIRUBINUR NEGATIVE 02/14/2019 1141   BILIRUBINUR Negative 02/10/2019 1341   KETONESUR NEGATIVE 02/14/2019 1141   PROTEINUR 100 (A) 02/14/2019 1141   UROBILINOGEN 0.2 12/09/2016 1131   NITRITE NEGATIVE 02/14/2019 1141   LEUKOCYTESUR LARGE (A) 02/14/2019 1141   Sepsis Labs: @LABRCNTIP (procalcitonin:4,lacticacidven:4)  ) Recent Results (from the past 240 hour(s))  Microscopic Examination     Status: Abnormal   Collection Time: 02/10/19  1:41 PM   URINE  Result Value Ref Range Status   WBC, UA >30 (A) 0 - 5 /hpf Final   RBC 11-30 (A) 0 - 2 /hpf Final   Epithelial Cells (non renal) 0-10 0 - 10 /hpf Final   Crystals Present (A) N/A Final   Crystal Type  Amorphous Sediment N/A Final   Bacteria, UA Many (A) None seen/Few Final  Urine culture     Status: Abnormal   Collection Time: 02/14/19 11:41 AM   Specimen: Urine, Random  Result Value Ref Range Status   Specimen Description   Final    URINE, RANDOM Performed at [facility], [address], [place], [place] [address]    Special Requests   Final    NONE Performed at [facility], [address], [place], [place] [address]    Culture (A)  Final    <10,000 COLONIES/mL INSIGNIFICANT GROWTH Performed at [facility], [place] [address] [place], [place] [address]    Report Status 02/15/2019 FINAL  Final  SARS CORONAVIRUS 2 (TAT 6-24 HRS) Nasopharyngeal Nasopharyngeal Swab     Status: None   Collection Time: 02/14/19  3:36 PM   Specimen: Nasopharyngeal Swab  Result Value Ref Range Status   SARS Coronavirus 2 NEGATIVE NEGATIVE Final  Comment: (NOTE) SARS-CoV-2 target nucleic acids are NOT DETECTED. The SARS-CoV-2 RNA is generally detectable in upper and lower respiratory specimens during the acute phase of infection. Negative results do not preclude SARS-CoV-2 infection, do not rule out co-infections with other pathogens, and should not be used as the sole basis for treatment or other patient management decisions. Negative results must be combined with clinical observations, patient history, and epidemiological information. The expected result is Negative. Fact Sheet for Patients: SugarRoll.be Fact Sheet for Healthcare Providers: https://www.woods-mathews.com/ This test is not yet approved or cleared by the [place] FDA and  has been authorized for detection and/or diagnosis of SARS-CoV-2 by FDA under an Emergency Use Authorization (EUA). This EUA will remain  in effect (meaning this test can be used) for the duration of the COVID-19 declaration under Section 56 4(b)(1) of the Act, 21 U.S.C. section  360bbb-3(b)(1), unless the authorization is terminated or revoked sooner. Performed at [facility], [place] [address] [place], [place] [address]          Radiology Studies: Dg Chest 1 View  Result Date: 02/14/2019 CLINICAL DATA:  Fall with hip fracture EXAM: CHEST  1 VIEW COMPARISON:  11/06/2016 FINDINGS: Left-sided pacing device as before. Suspected skin fold artifact over the left lower chest. Hyperinflated lungs. No consolidation or pleural effusion. Stable cardiomediastinal silhouette. Aortic atherosclerosis. No pneumothorax. IMPRESSION: No active disease. Electronically Signed   By: Donavan Foil M.D.   On: 02/14/2019 15:33   Ct Head Wo Contrast  Result Date: 02/14/2019 CLINICAL DATA:  Ataxia after fall hitting head on side of a chair. Patient is anticoagulated. EXAM: CT HEAD WITHOUT CONTRAST TECHNIQUE: Contiguous axial images were obtained from the base of the skull through the vertex without intravenous contrast. COMPARISON:  08/19/2005 FINDINGS: Brain: No evidence of acute infarction, hemorrhage, hydrocephalus, extra-axial collection or mass lesion/mass effect. Prominence of the sulci and ventricles noted. There is mild diffuse low-attenuation within the subcortical and periventricular white matter compatible with chronic microvascular disease. Vascular: No hyperdense vessel or unexpected calcification. Skull: Normal. Negative for fracture or focal lesion. Sinuses/Orbits: The paranasal sinuses are clear. Partial opacification of the mastoid air cells. Other: None IMPRESSION: 1. No acute intracranial abnormality. 2. Chronic small vessel ischemic change and brain atrophy. 3. Mild bilateral mastoid air cell effusions. Electronically Signed   By: Kerby Moors M.D.   On: 02/14/2019 12:07   Dg Hip Unilat W Or Wo Pelvis 2-3 Views Right  Result Date: 02/14/2019 CLINICAL DATA:  Right hip pain after fall EXAM: DG HIP (WITH OR WITHOUT PELVIS) 2-3V RIGHT COMPARISON:  CT 12/10/2016  FINDINGS: Catheter overlying the pelvis. Post treatment changes in the region of the prostate. Moderate arthritis of the left hip. Acute right femoral neck fracture with mild cephalad migration of the trochanter. IMPRESSION: Acute right femoral neck fracture Electronically Signed   By: Donavan Foil M.D.   On: 02/14/2019 15:31        Scheduled Meds: . amiodarone  100 mg Oral QHS  . dorzolamide-timolol  1 drop Both Eyes BID  . ferrous sulfate  325 mg Oral Q breakfast  . [START ON 02/16/2019] furosemide  40 mg Oral QODAY  . levothyroxine  100 mcg Oral Q0600  . multivitamin with minerals  1 tablet Oral Daily  . pantoprazole  40 mg Oral Daily  . pravastatin  20 mg Oral QHS  . tamsulosin  0.4 mg Oral QPC supper   Continuous Infusions:   LOS: 1 day  Time spent: 25 minutes    Edwin Dada, MD [facility] 02/15/2019, 3:56 PM     Please page though [place] or Epic secure chat:  For [employer], Adult nurse

## 2019-02-15 NOTE — Progress Notes (Signed)
INR up to 4.4 today.  Will hold off on keeping him n.p.o. until labs tomorrow obtained.  Tentatively scheduled for tomorrow but suspect will be Friday or Saturday before surgery can be done.

## 2019-02-15 NOTE — Progress Notes (Signed)
Notified Dr. Loleta Books, who is sitting on the unit, of lab calling critical INR 4.56. MD acknowledged.

## 2019-02-16 ENCOUNTER — Encounter: Payer: Self-pay | Admitting: *Deleted

## 2019-02-16 ENCOUNTER — Inpatient Hospital Stay: Payer: Medicare Other | Admitting: Certified Registered Nurse Anesthetist

## 2019-02-16 ENCOUNTER — Inpatient Hospital Stay: Payer: Medicare Other

## 2019-02-16 ENCOUNTER — Encounter
Admission: EM | Disposition: A | Discharge status: Skilled Nursing Facility | Payer: Self-pay | Source: Home / Self Care | Attending: Family Medicine

## 2019-02-16 HISTORY — PX: HIP ARTHROPLASTY: SHX981

## 2019-02-16 LAB — CBC
HCT: 27.8 % — ABNORMAL LOW (ref 39.0–52.0)
Hemoglobin: 9.1 g/dL — ABNORMAL LOW (ref 13.0–17.0)
MCH: 32.3 pg (ref 26.0–34.0)
MCHC: 32.7 g/dL (ref 30.0–36.0)
MCV: 98.6 fL (ref 80.0–100.0)
Platelets: 141 10*3/uL — ABNORMAL LOW (ref 150–400)
RBC: 2.82 MIL/uL — ABNORMAL LOW (ref 4.22–5.81)
RDW: 12.5 % (ref 11.5–15.5)
WBC: 6.9 10*3/uL (ref 4.0–10.5)
nRBC: 0 % (ref 0.0–0.2)

## 2019-02-16 LAB — BASIC METABOLIC PANEL
Anion gap: 8 (ref 5–15)
BUN: 39 mg/dL — ABNORMAL HIGH (ref 8–23)
CO2: 19 mmol/L — ABNORMAL LOW (ref 22–32)
Calcium: 8.4 mg/dL — ABNORMAL LOW (ref 8.9–10.3)
Chloride: 108 mmol/L (ref 98–111)
Creatinine, Ser: 2.23 mg/dL — ABNORMAL HIGH (ref 0.61–1.24)
GFR calc Af Amer: 28 mL/min — ABNORMAL LOW (ref >60)
GFR calc non Af Amer: 24 mL/min — ABNORMAL LOW (ref >60)
Glucose, Bld: 102 mg/dL — ABNORMAL HIGH (ref 70–99)
Potassium: 4.7 mmol/L (ref 3.5–5.1)
Sodium: 135 mmol/L (ref 135–145)

## 2019-02-16 LAB — PROTIME-INR
INR: 1.7 — ABNORMAL HIGH (ref 0.8–1.2)
INR: 1.5 — ABNORMAL HIGH (ref 0.8–1.2)
Prothrombin Time: 19.7 seconds — ABNORMAL HIGH (ref 11.4–15.2)
Prothrombin Time: 17.6 seconds — ABNORMAL HIGH (ref 11.4–15.2)

## 2019-02-16 LAB — TYPE AND SCREEN
ABO/RH(D): O POS
Antibody Screen: NEGATIVE

## 2019-02-16 LAB — ABO/RH: ABO/RH(D): O POS

## 2019-02-16 SURGERY — HEMIARTHROPLASTY, HIP, DIRECT ANTERIOR APPROACH, FOR FRACTURE
Anesthesia: General | Site: Hip | Laterality: Right

## 2019-02-16 MED ORDER — METHOCARBAMOL 1000 MG/10ML IJ SOLN
500.0000 mg | Freq: Four times a day (QID) | INTRAVENOUS | Status: DC | PRN
  Filled 2019-02-16: qty 5

## 2019-02-16 MED ORDER — MAGNESIUM CITRATE PO SOLN
1.0000 | Freq: Once | ORAL | Status: DC | PRN
  Filled 2019-02-16: qty 296

## 2019-02-16 MED ORDER — PANTOPRAZOLE SODIUM 40 MG PO TBEC
40.0000 mg | DELAYED_RELEASE_TABLET | Freq: Every day | ORAL | Status: DC
  Administered 2019-02-17 – 2019-02-20 (×4): 40 mg via ORAL
  Filled 2019-02-16 (×4): qty 1

## 2019-02-16 MED ORDER — MAGNESIUM HYDROXIDE 400 MG/5ML PO SUSP
30.0000 mL | Freq: Every day | ORAL | Status: DC | PRN
  Administered 2019-02-19: 30 mL via ORAL
  Filled 2019-02-16: qty 30

## 2019-02-16 MED ORDER — FENTANYL CITRATE (PF) 100 MCG/2ML IJ SOLN: 25.0000 ug | INTRAMUSCULAR | Status: DC | PRN

## 2019-02-16 MED ORDER — PHENOL 1.4 % MT LIQD
1.0000 | OROMUCOSAL | Status: DC | PRN
  Filled 2019-02-16: qty 177

## 2019-02-16 MED ORDER — EPHEDRINE SULFATE 50 MG/ML IJ SOLN
INTRAMUSCULAR | Status: DC | PRN
  Administered 2019-02-16 (×2): 10 mg via INTRAVENOUS
  Administered 2019-02-16: 5 mg via INTRAVENOUS
  Administered 2019-02-16: 10 mg via INTRAVENOUS
  Administered 2019-02-16: 5 mg via INTRAVENOUS

## 2019-02-16 MED ORDER — ONDANSETRON HCL 4 MG/2ML IJ SOLN
INTRAMUSCULAR | Status: DC | PRN
  Administered 2019-02-16: 4 mg via INTRAVENOUS

## 2019-02-16 MED ORDER — BISACODYL 10 MG RE SUPP: 10.0000 mg | Freq: Every day | RECTAL | Status: DC | PRN

## 2019-02-16 MED ORDER — LIDOCAINE HCL (CARDIAC) PF 100 MG/5ML IV SOSY
PREFILLED_SYRINGE | INTRAVENOUS | Status: DC | PRN
  Administered 2019-02-16: 80 mg via INTRAVENOUS

## 2019-02-16 MED ORDER — ONDANSETRON HCL 4 MG/2ML IJ SOLN
INTRAMUSCULAR | Status: AC
  Filled 2019-02-16: qty 2

## 2019-02-16 MED ORDER — DOCUSATE SODIUM 100 MG PO CAPS
100.0000 mg | ORAL_CAPSULE | Freq: Two times a day (BID) | ORAL | Status: DC
  Administered 2019-02-16 – 2019-02-20 (×8): 100 mg via ORAL
  Filled 2019-02-16 (×8): qty 1

## 2019-02-16 MED ORDER — CEFAZOLIN SODIUM-DEXTROSE 1-4 GM/50ML-% IV SOLN
1.0000 g | Freq: Four times a day (QID) | INTRAVENOUS | Status: AC
  Administered 2019-02-16 – 2019-02-17 (×2): 1 g via INTRAVENOUS
  Filled 2019-02-16 (×2): qty 50

## 2019-02-16 MED ORDER — ACETAMINOPHEN 10 MG/ML IV SOLN
INTRAVENOUS | Status: DC | PRN
  Administered 2019-02-16: 1000 mg via INTRAVENOUS

## 2019-02-16 MED ORDER — ONDANSETRON HCL 4 MG/2ML IJ SOLN: 4.0000 mg | Freq: Four times a day (QID) | INTRAMUSCULAR | Status: DC | PRN

## 2019-02-16 MED ORDER — WARFARIN - PHYSICIAN DOSING INPATIENT: Freq: Every day | Status: DC

## 2019-02-16 MED ORDER — FENTANYL CITRATE (PF) 100 MCG/2ML IJ SOLN
INTRAMUSCULAR | Status: AC
  Filled 2019-02-16: qty 2

## 2019-02-16 MED ORDER — METOCLOPRAMIDE HCL 10 MG PO TABS: 5.0000 mg | ORAL_TABLET | Freq: Three times a day (TID) | ORAL | Status: DC | PRN

## 2019-02-16 MED ORDER — FENTANYL CITRATE (PF) 100 MCG/2ML IJ SOLN
INTRAMUSCULAR | Status: DC | PRN
  Administered 2019-02-16 (×4): 25 ug via INTRAVENOUS

## 2019-02-16 MED ORDER — WARFARIN SODIUM 2 MG PO TABS
2.0000 mg | ORAL_TABLET | Freq: Every day | ORAL | Status: DC
  Administered 2019-02-16: 2 mg via ORAL
  Filled 2019-02-16: qty 1

## 2019-02-16 MED ORDER — ZOLPIDEM TARTRATE 5 MG PO TABS: 5.0000 mg | ORAL_TABLET | Freq: Every evening | ORAL | Status: DC | PRN

## 2019-02-16 MED ORDER — EPINEPHRINE PF 1 MG/ML IJ SOLN
INTRAMUSCULAR | Status: AC
  Filled 2019-02-16: qty 1

## 2019-02-16 MED ORDER — DIPHENHYDRAMINE HCL 12.5 MG/5ML PO ELIX: 12.5000 mg | ORAL_SOLUTION | ORAL | Status: DC | PRN

## 2019-02-16 MED ORDER — NEOMYCIN-POLYMYXIN B GU 40-200000 IR SOLN
Status: DC | PRN
  Administered 2019-02-16: 4 mL

## 2019-02-16 MED ORDER — ONDANSETRON HCL 4 MG/2ML IJ SOLN: 4.0000 mg | Freq: Once | INTRAMUSCULAR | Status: DC | PRN

## 2019-02-16 MED ORDER — SODIUM CHLORIDE FLUSH 0.9 % IV SOLN
INTRAVENOUS | Status: AC
  Filled 2019-02-16: qty 40

## 2019-02-16 MED ORDER — NEOMYCIN-POLYMYXIN B GU 40-200000 IR SOLN
Status: AC
  Filled 2019-02-16: qty 20

## 2019-02-16 MED ORDER — DEXAMETHASONE SODIUM PHOSPHATE 10 MG/ML IJ SOLN
INTRAMUSCULAR | Status: DC | PRN
  Administered 2019-02-16: 5 mg via INTRAVENOUS

## 2019-02-16 MED ORDER — EPHEDRINE SULFATE 50 MG/ML IJ SOLN
INTRAMUSCULAR | Status: AC
  Filled 2019-02-16: qty 1

## 2019-02-16 MED ORDER — DEXAMETHASONE SODIUM PHOSPHATE 10 MG/ML IJ SOLN
INTRAMUSCULAR | Status: AC
  Filled 2019-02-16: qty 1

## 2019-02-16 MED ORDER — METHOCARBAMOL 500 MG PO TABS: 500.0000 mg | ORAL_TABLET | Freq: Four times a day (QID) | ORAL | Status: DC | PRN

## 2019-02-16 MED ORDER — METOCLOPRAMIDE HCL 5 MG/ML IJ SOLN: 5.0000 mg | Freq: Three times a day (TID) | INTRAMUSCULAR | Status: DC | PRN

## 2019-02-16 MED ORDER — LIDOCAINE HCL (PF) 2 % IJ SOLN
INTRAMUSCULAR | Status: AC
  Filled 2019-02-16: qty 10

## 2019-02-16 MED ORDER — ALUM & MAG HYDROXIDE-SIMETH 200-200-20 MG/5ML PO SUSP: 30.0000 mL | ORAL | Status: DC | PRN

## 2019-02-16 MED ORDER — KETAMINE HCL 50 MG/ML IJ SOLN
INTRAMUSCULAR | Status: DC | PRN
  Administered 2019-02-16 (×3): 10 mg via INTRAMUSCULAR

## 2019-02-16 MED ORDER — CEFAZOLIN SODIUM-DEXTROSE 1-4 GM/50ML-% IV SOLN
1.0000 g | Freq: Once | INTRAVENOUS | Status: AC
  Administered 2019-02-16: 1 g via INTRAVENOUS

## 2019-02-16 MED ORDER — MENTHOL 3 MG MT LOZG
1.0000 | LOZENGE | OROMUCOSAL | Status: DC | PRN
  Filled 2019-02-16: qty 9

## 2019-02-16 MED ORDER — SODIUM CHLORIDE 0.9 % IV SOLN
INTRAVENOUS | Status: DC | PRN
  Administered 2019-02-16: 60 mL

## 2019-02-16 MED ORDER — BUPIVACAINE HCL (PF) 0.25 % IJ SOLN
INTRAMUSCULAR | Status: AC
  Filled 2019-02-16: qty 30

## 2019-02-16 MED ORDER — SODIUM CHLORIDE 0.9 % IV SOLN
INTRAVENOUS | Status: DC
  Administered 2019-02-16: 17:00:00 via INTRAVENOUS

## 2019-02-16 MED ORDER — PROPOFOL 10 MG/ML IV BOLUS
INTRAVENOUS | Status: DC | PRN
  Administered 2019-02-16 (×2): 50 mg via INTRAVENOUS

## 2019-02-16 MED ORDER — LACTATED RINGERS IV SOLN
INTRAVENOUS | Status: DC | PRN
  Administered 2019-02-16: 14:00:00 via INTRAVENOUS

## 2019-02-16 MED ORDER — PHENYLEPHRINE HCL (PRESSORS) 10 MG/ML IV SOLN
INTRAVENOUS | Status: DC | PRN
  Administered 2019-02-16: 100 ug via INTRAVENOUS

## 2019-02-16 MED ORDER — PROPOFOL 10 MG/ML IV BOLUS
INTRAVENOUS | Status: AC
  Filled 2019-02-16: qty 20

## 2019-02-16 MED ORDER — BUPIVACAINE LIPOSOME 1.3 % IJ SUSP
INTRAMUSCULAR | Status: AC
  Filled 2019-02-16: qty 20

## 2019-02-16 MED ORDER — BUPIVACAINE-EPINEPHRINE 0.25% -1:200000 IJ SOLN
INTRAMUSCULAR | Status: DC | PRN
  Administered 2019-02-16: 30 mL

## 2019-02-16 MED ORDER — CEFAZOLIN SODIUM-DEXTROSE 1-4 GM/50ML-% IV SOLN
INTRAVENOUS | Status: AC
  Filled 2019-02-16: qty 50

## 2019-02-16 MED ORDER — ONDANSETRON HCL 4 MG PO TABS: 4.0000 mg | ORAL_TABLET | Freq: Four times a day (QID) | ORAL | Status: DC | PRN

## 2019-02-16 SURGICAL SUPPLY — 64 items
BLADE SAGITTAL AGGR TOOTH XLG (BLADE) ×2 IMPLANT
BNDG COHESIVE 6X5 TAN STRL LF (GAUZE/BANDAGES/DRESSINGS) ×6 IMPLANT
CANISTER SUCT 1200ML W/VALVE (MISCELLANEOUS) ×2 IMPLANT
CANISTER WOUND CARE 500ML ATS (WOUND CARE) ×2 IMPLANT
CEMENT HV SMART SET (Cement) ×4 IMPLANT
CEMENT RESTRICTOR DEPUY SZ 4 (Cement) ×2 IMPLANT
CHLORAPREP W/TINT 26 (MISCELLANEOUS) ×2 IMPLANT
COVER BACK TABLE REUSABLE LG (DRAPES) ×2 IMPLANT
COVER WAND RF STERILE (DRAPES) ×2 IMPLANT
DRAPE 3/4 80X56 (DRAPES) ×6 IMPLANT
DRAPE C-ARM XRAY 36X54 (DRAPES) ×2 IMPLANT
DRAPE INCISE IOBAN 66X60 STRL (DRAPES) IMPLANT
DRAPE POUCH INSTRU U-SHP 10X18 (DRAPES) ×2 IMPLANT
DRESSING SURGICEL FIBRLLR 1X2 (HEMOSTASIS) ×2 IMPLANT
DRSG OPSITE POSTOP 4X8 (GAUZE/BANDAGES/DRESSINGS) ×4 IMPLANT
DRSG SURGICEL FIBRILLAR 1X2 (HEMOSTASIS) ×4
ELECT BLADE 6.5 EXT (BLADE) ×2 IMPLANT
ELECT REM PT RETURN 9FT ADLT (ELECTROSURGICAL) ×2
ELECTRODE REM PT RTRN 9FT ADLT (ELECTROSURGICAL) ×1 IMPLANT
GLOVE BIOGEL PI IND STRL 9 (GLOVE) ×1 IMPLANT
GLOVE BIOGEL PI INDICATOR 9 (GLOVE) ×1
GLOVE SURG SYN 9.0  PF PI (GLOVE) ×2
GLOVE SURG SYN 9.0 PF PI (GLOVE) ×2 IMPLANT
GOWN SRG 2XL LVL 4 RGLN SLV (GOWNS) ×1 IMPLANT
GOWN STRL NON-REIN 2XL LVL4 (GOWNS) ×1
GOWN STRL REUS W/ TWL LRG LVL3 (GOWN DISPOSABLE) ×1 IMPLANT
GOWN STRL REUS W/TWL LRG LVL3 (GOWN DISPOSABLE) ×1
HEAD BIPOLAR SZ50 HEAD 28MM (Head) ×2 IMPLANT
HEMOVAC 400CC 10FR (MISCELLANEOUS) IMPLANT
HIP FEM HD M 28 (Head) ×2 IMPLANT
HIP STEM FEM 3 STD (Stem) ×2 IMPLANT
HOLDER FOLEY CATH W/STRAP (MISCELLANEOUS) ×2 IMPLANT
HOOD PEEL AWAY FLYTE STAYCOOL (MISCELLANEOUS) ×2 IMPLANT
KIT PREVENA INCISION MGT 13 (CANNISTER) ×2 IMPLANT
MAT ABSORB  FLUID 56X50 GRAY (MISCELLANEOUS) ×1
MAT ABSORB FLUID 56X50 GRAY (MISCELLANEOUS) ×1 IMPLANT
NDL SAFETY ECLIPSE 18X1.5 (NEEDLE) ×1 IMPLANT
NEEDLE HYPO 18GX1.5 SHARP (NEEDLE) ×1
NEEDLE SPNL 20GX3.5 QUINCKE YW (NEEDLE) ×4 IMPLANT
NS IRRIG 1000ML POUR BTL (IV SOLUTION) ×2 IMPLANT
PACK HIP COMPR (MISCELLANEOUS) ×2 IMPLANT
PENCIL SMOKE EVACUATOR COATED (MISCELLANEOUS) ×2 IMPLANT
PRESSURIZER FEM CANAL M (MISCELLANEOUS) ×2 IMPLANT
SCALPEL PROTECTED #10 DISP (BLADE) ×4 IMPLANT
SOL PREP PVP 2OZ (MISCELLANEOUS) ×2
SOLUTION PREP PVP 2OZ (MISCELLANEOUS) ×1 IMPLANT
SPONGE DRAIN TRACH 4X4 STRL 2S (GAUZE/BANDAGES/DRESSINGS) ×2 IMPLANT
STAPLER SKIN PROX 35W (STAPLE) ×2 IMPLANT
STRAP SAFETY 5IN WIDE (MISCELLANEOUS) ×2 IMPLANT
SUT DVC 2 QUILL PDO  T11 36X36 (SUTURE) ×1
SUT DVC 2 QUILL PDO T11 36X36 (SUTURE) ×1 IMPLANT
SUT SILK 0 (SUTURE) ×1
SUT SILK 0 30XBRD TIE 6 (SUTURE) ×1 IMPLANT
SUT V-LOC 90 ABS DVC 3-0 CL (SUTURE) ×2 IMPLANT
SUT VIC AB 1 CT1 36 (SUTURE) ×2 IMPLANT
SYR 20ML LL LF (SYRINGE) ×2 IMPLANT
SYR 30ML LL (SYRINGE) ×2 IMPLANT
SYR 50ML LL SCALE MARK (SYRINGE) ×4 IMPLANT
SYR BULB IRRIG 60ML STRL (SYRINGE) ×2 IMPLANT
TAPE MICROFOAM 4IN (TAPE) ×2 IMPLANT
TOWEL OR 17X26 4PK STRL BLUE (TOWEL DISPOSABLE) ×2 IMPLANT
TRAY FOLEY MTR SLVR 16FR STAT (SET/KITS/TRAYS/PACK) ×2 IMPLANT
TUBE SUCT KAM VAC (TUBING) ×2 IMPLANT
TUBING CONNECTING 10 (TUBING) ×2 IMPLANT

## 2019-02-16 NOTE — Anesthesia Post-op Follow-up Note (Signed)
Anesthesia QCDR form completed.        

## 2019-02-16 NOTE — Transfer of Care (Signed)
Immediate Anesthesia Transfer of Care Note  Patient: [name] [name]  Procedure(s) Performed: RIGHT ANTERIOR HIP HEMIARTHROPLASTY,CEMENTED (Right Hip)  Patient Location: PACU  Anesthesia Type:General  Level of Consciousness: sedated  Airway & Oxygen Therapy: Patient Spontanous Breathing and Patient connected to face mask oxygen  Post-op Assessment: Report given to RN and Post -op Vital signs reviewed and stable  Post vital signs: Reviewed  Last Vitals:  Vitals Value Taken Time  BP 105/56 02/16/19 1534  Temp 36.9 C 02/16/19 1530  Pulse 60 02/16/19 1534  Resp 17 02/16/19 1534  SpO2 100 % 02/16/19 1534  Vitals shown include unvalidated device data.  Last Pain:  Vitals:   02/16/19 0817  TempSrc: Oral  PainSc:       Patients Stated Pain Goal: 0 (75/10/25 8527)  Complications: No apparent anesthesia complications

## 2019-02-16 NOTE — Progress Notes (Signed)
Patient resting fairly comfortably.  His INR has come down dramatically overnight and we will recheck it at 1130 today I plan on surgery as it has come down to 1.7 INR this morning.

## 2019-02-16 NOTE — Plan of Care (Signed)
Pt free of anxiety this shift. Pt has no complaints of pain beyond a 2. Tylenol given as ordered at pt request. Pdowless, rn

## 2019-02-16 NOTE — Anesthesia Preprocedure Evaluation (Signed)
Anesthesia Evaluation  Patient identified by MRN, date of birth, ID band Patient awake    Reviewed: Allergy & Precautions, H&P , NPO status , Patient's Chart, lab work & pertinent test results, reviewed documented beta blocker date and time   Airway Mallampati: II   Neck ROM: full    Dental  (+) Poor Dentition   Pulmonary neg pulmonary ROS,    Pulmonary exam normal        Cardiovascular Exercise Tolerance: Poor hypertension, On Medications + dysrhythmias Atrial Fibrillation (-) pacemaker+ Cardiac Defibrillator  Rhythm:regular Rate:Normal     Neuro/Psych negative neurological ROS  negative psych ROS   GI/Hepatic Neg liver ROS, PUD, GERD  Medicated,  Endo/Other  Hypothyroidism   Renal/GU CRFRenal disease  negative genitourinary   Musculoskeletal   Abdominal   Peds  Hematology  (+) anemia ,   Anesthesia Other Findings Past Medical History: No date: AICD (automatic cardioverter/defibrillator) present     Comment:  PACEMAKER No date: Anemia No date: Cancer ([place]) 07/2005: Cardiac pacemaker in situ     Comment:  symptomatic bradycardia 04/13/2017: CKD (chronic kidney disease) stage 3, GFR 30-59 ml/min No date: Diverticulosis of colon (without mention of hemorrhage) No date: Dysrhythmia 10/2007: Eye cancer     Comment:  sebaceous cell carcinoma, left eye No date: 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 No date: GERD (gastroesophageal reflux disease) No date: GI hemorrhage No date: Hyperlipidemia No date: Hypertension No date: Lower extremity edema 06/09/2013: Neck arthritis, Severe, multi-level No date: Paroxysmal atrial fibrillation ([name])     Comment:  s/p failed ablation 1975: Peptic ulcer, unspecified site, unspecified as acute or chronic,  without mention of hemorrhage, perforation, or obstruction 12/2000: Personal history of malignant neoplasm of prostate     Comment:  5 weeks radiation, radiation  seeds No date: Radiation proctitis No date: Sinoatrial node dysfunction ([name]) No date: Unspecified glaucoma(365.9) No date: Unspecified hypothyroidism No date: Vitamin B12 deficiency Past Surgical History: No date: CARDIAC CATHETERIZATION 2001, 2003: CARDIAC ELECTROPHYSIOLOGY MAPPING AND ABLATION     Comment:  failure No date: CATARACT EXTRACTION 09/21/2018: CYSTOGRAM; N/A     Comment:  Procedure: CYSTOGRAM;  Surgeon: Abbie Sons, MD;                Location: [facility] ORS;  Service: Urology;  Laterality: N/A; 01/29/2017: CYSTOSCOPY WITH URETHRAL DILATATION; N/A     Comment:  Procedure: CYSTOSCOPY WITH URETHRAL DILATATION;                Surgeon: Abbie Sons, MD;  Location: [facility] ORS;                Service: Urology;  Laterality: N/A; 09/21/2018: CYSTOSCOPY WITH URETHRAL DILATATION; N/A     Comment:  Procedure: CYSTOSCOPY WITH URETHRAL DILATATION;                Surgeon: Abbie Sons, MD;  Location: [facility] ORS;                Service: Urology;  Laterality: N/A; 02/24/2016: EP IMPLANTABLE DEVICE; N/A     Comment:  Procedure: PPM Generator Changeout;  Surgeon: Deboraha Sprang, MD;  Location: [facility];  Service:               Cardiovascular;  Laterality: N/A; No date: gastric ulcer surgery 1994, 2001, 2003: HERNIA REPAIR     Comment:  s/p drainage and  complications, 0/0938, 03/8297 mesh               removal 07/2005: INSERT / REPLACE / REMOVE PACEMAKER     Comment:  symptomatic bradycardia No date: kidney stones No date: NOSE SURGERY     Comment:  cancer removed  No date: PROSTATE SURGERY No date: SKIN CANCER DESTRUCTION No date: TOOTH EXTRACTION BMI    Body Mass Index: 24.97 kg/m     Reproductive/Obstetrics negative OB ROS                             Anesthesia Physical Anesthesia Plan  ASA: III and emergent  Anesthesia Plan: Spinal   Post-op Pain Management:    Induction:   PONV Risk Score and Plan:   Airway  Management Planned:   Additional Equipment:   Intra-op Plan:   Post-operative Plan:   Informed Consent: I have reviewed the patients History and Physical, chart, labs and discussed the procedure including the risks, benefits and alternatives for the proposed anesthesia with the patient or authorized representative who has indicated his/her understanding and acceptance.     Dental Advisory Given  Plan Discussed with: CRNA  Anesthesia Plan Comments:         Anesthesia Quick Evaluation

## 2019-02-16 NOTE — TOC Initial Note (Signed)
Transition of Care [facility]) - Initial/Assessment Note    Patient Details  Name: [name] [name] MRN: [id number] Date of Birth: March 11, 1923  Transition of Care [facility]) CM/SW Contact:    Shelbie Hutching, RN Phone Number: 02/16/2019, 12:36 PM  Clinical Narrative:                 Patient admitted to the hospital with right hip fracture after falling in his kitchen.  Daughter [name] [name] is present in the room with the patient and lives with the patient.  Patient is independent in ADL's at home and requires no assistive devices.  Patient reports that he does have a wheelchair at home but does not use it.  Patient reports that he has had home health in the past but cannot remember the name of the agency, patient does not have a preference if home health is recommended.  Patient has never needed SNF but prefers [facility] if PT recommends SNF at discharge.  If patient's INR is down today patient can go to the OR to have his right hip repaired.   RNCM will cont to follow patient and assist with discharge recommendations after surgery.   Expected Discharge Plan: Skilled Nursing Facility Barriers to Discharge: Continued Medical Work up   Patient Goals and CMS Choice   CMS Medicare.gov Compare Post Acute Care list provided to:: Patient Choice offered to / list presented to : Patient  Expected Discharge Plan and Services Expected Discharge Plan: [place]   Discharge Planning Services: CM Consult Post Acute Care Choice: [place] Living arrangements for the past 2 months: Single Family Home                                      Prior Living Arrangements/Services Living arrangements for the past 2 months: Single Family Home Lives with:: Adult Children(daughter [place]) Patient language and need for interpreter reviewed:: Yes Do you feel safe going back to the place where you live?: Yes      Need for Family Participation in Patient Care: Yes (Comment)(hip  fracture) Care giver support system in place?: Yes (comment) Current home services: (has a wheelchair) Criminal Activity/Legal Involvement Pertinent to Current Situation/Hospitalization: No - Comment as needed  Activities of Daily Living      Permission Sought/Granted Permission sought to share information with : Case Manager, Family Supports, Chartered certified accountant granted to share information with : Yes, Verbal Permission Granted     Permission granted to share info w AGENCY: Dietitian granted to share info w Relationship: Daughter     Emotional Assessment Appearance:: Appears stated age Attitude/Demeanor/Rapport: Engaged Affect (typically observed): Accepting Orientation: : Oriented to Self, Oriented to Place, Oriented to  Time, Oriented to Situation Alcohol / Substance Use: Not Applicable Psych Involvement: No (comment)  Admission diagnosis:  Warfarin anticoagulation [Z79.01] Closed fracture of neck of right femur, initial encounter ([place]) [S72.001A] Longstanding persistent atrial fibrillation ([place]) [I48.11] Patient Active Problem List   Diagnosis Date Noted  . Closed right hip fracture, initial encounter ([place]) 02/14/2019  . Pre-op evaluation   . Benign prostatic hyperplasia   . Glaucoma   . Chronic renal failure, stage 3b 04/13/2017  . DDD (degenerative disc disease), cervical, Severe 06/09/2013  . Neck arthritis, Severe, multi-level 06/09/2013  . Sinoatrial node dysfunction ([name])   . Long term (current) use of anticoagulants 06/24/2010  . IRON DEFICIENCY  04/17/2010  . GLAUCOMA 08/07/2008  . GERD 08/07/2008  . PACEMAKER, PERMANENT 08/07/2008  . Vitamin B 12 deficiency 07/19/2008  . PEPTIC ULCER DISEASE 07/18/2008  . RADIATION PROCTITIS 07/18/2008  . DIVERTICULOSIS, COLON 07/18/2008  . Prostate cancer, in remission 07/18/2008  . Essential hypertension 05/09/2008  . Hyperlipidemia 02/06/2008  . AF (paroxysmal atrial  fibrillation) ([place]) 02/06/2008  . Hypothyroidism 12/01/2007  . HERN UNS SITE ABD CAV W/O MENTION OBST/GANGREN 12/01/2007   PCP:  Owens Loffler, MD Pharmacy:   [id number] [address], [place] [address] Phone: [phone] Fax: [phone]     Social Determinants of Health (SDOH) Interventions    Readmission Risk Interventions No flowsheet data found.

## 2019-02-16 NOTE — Op Note (Signed)
02/16/2019  3:32 PM  PATIENT:  [name] [name]  85 y.o. male  PRE-OPERATIVE DIAGNOSIS:  HIP HEMI ARTHROPLASTY  POST-OPERATIVE DIAGNOSIS:  RIGHT HIP FRACTURE  PROCEDURE:  Procedure(s): RIGHT ANTERIOR HIP HEMIARTHROPLASTY,CEMENTED (Right)  SURGEON: Laurene Footman, MD  ASSISTANTS: None  ANESTHESIA:   general  EBL:  Total I/O In: 800 [I.V.:800] Out: 700 [Urine:450; Blood:250]  BLOOD ADMINISTERED:none  DRAINS: none   LOCAL MEDICATIONS USED:  MARCAINE    and OTHER Exparel  SPECIMEN:  Source of Specimen:  Right femoral head and neck fragments  DISPOSITION OF SPECIMEN:  PATHOLOGY  COUNTS:  YES  TOURNIQUET:  * No tourniquets in log *  IMPLANTS: Medacta AMIS 3 standard cemented stem with M 28 mm metal head and 50 mm bipolar head  DICTATION: .Dragon Dictation   The patient was brought to the operating room and after spinal anesthesia was obtained patient was placed on the operative table with the ipsilateral foot into the Medacta attachment, contralateral leg on a well-padded table. C-arm was brought in and preop template x-ray taken. After prepping and draping in usual sterile fashion appropriate patient identification and timeout procedures were completed. Anterior approach to the hip was obtained and centered over the greater trochanter and TFL muscle. The subcutaneous tissue was incised hemostasis being achieved by electrocautery. TFL fascia was incised and the muscle retracted laterally deep retractor placed. The lateral femoral circumflex vessels were identified and ligated. The anterior capsule was exposed and a capsulotomy performed.  The neck was exposed and fracture identified with completely displaced femoral neck fracture.  Cut was carried out the appropriate level on the femur above the lesser trochanter and the head neck removed.  This was sent as a specimen.  Acetabulum is free of significant degenerative change.  And sized to a 50 mm head with trials.  The leg was then  externally rotated and ischiofemoral and pubofemoral releases carried out. The femur was sequentially broached to a size 4, size 4 stem standard trials were placed and the final components chosen. The 3 cemented stem was inserted with excess cement removed and after the cement had set,  a metal M 28 mm head and 50 mm bipolar head. The hip was reduced and was stable the wound was thoroughly irrigated with fibrillar placed along the posterior capsule and medial neck. The deep fascia ws closed using a heavy Quill after infiltration of 30 cc of quarter percent Sensorcaine with epinephrine and Exparel throughout case,.3-0 V-loc to close the skin with skin staples.Prevana wound vac applied and tape patient was sent to recovery in stable condition.   PLAN OF CARE: continue as inpatient

## 2019-02-16 NOTE — Progress Notes (Signed)
PROGRESS NOTE    BROUGHTON EPPINGER  DQQ:[id number] DOB: [birth year] DOA: 02/14/2019 PCP: Owens Loffler, MD      Brief Narrative:  [name] is a 85 y.o. M with hx pAF on amiodarone and warfarin, bradycardia s/p PPM, CKD IV baseline 1.9-2.2, prostate Ca remote, who presented with fall and right hip pain.  In the ER, radiographs showed evidence of right hip fracture.  Orthopedics were consulted and recommended operative repair.        Assessment & Plan:  Acute right femoral neck fracture Mechanical fall, right femoral neck fracture.  Dr. Rudene Christians has been consulted.  Surgery delayed by elevated and difficult to reverse INR. -Consult orthopedics, appreciate recommendations -Acetaminophen, oxycodone, or morphine as needed for pain    Atrial fibrillation, paroxysmal Pacemaker in place Rate controlled overnight. INR 1.7 this morning, orthopedics will repeat at 11 AM, anticipate surgery later today -Continue amiodarone -Hold warfarin for surgery, resume after     Chronic kidney disease stage IV Creatinine stable relative to baseline -Continue home furosemide -Continue pravastatin  Anemia of chronic kidney disease Hemoglobin stable relative to baseline -Continue oral iron  Hypothyroidism -Continue levothyroxine  BPH -Continue Flomax  GERD -Continue pantoprazole        MDM and disposition: The below labs and imaging reports reviewed and summarized above.  Medication management as above.   The patient was admitted with acute right hip fracture.    We continue to correct his INR, then plan for operative repair, hopefully likely today, followed by PT eval and SNF.      DVT prophylaxis: SCDs Code Status: DNR Family Communication: Daughter at bedside    Consultants:   Orthopedics  Procedures:     Antimicrobials:       Subjective: He has right knee and leg pain and heel pain, his right hip pain is improved.  No confusion, fever, cough,  dyspnea, chest pain, palpitations.  Objective: Vitals:   02/15/19 1531 02/15/19 2004 02/16/19 0337 02/16/19 0817  BP: 110/70 (!) 102/58 (!) 101/55 107/62  Pulse: 60 67 (!) 59 63  Resp: 18 17 16 18   Temp: 98 F (36.7 C) 98.9 F (37.2 C) 98.6 F (37 C) 98.6 F (37 C)  TempSrc:  Oral Oral Oral  SpO2: 97% 97% 97% 98%  Weight:        Intake/Output Summary (Last 24 hours) at 02/16/2019 1036 Last data filed at 02/16/2019 0817 Gross per 24 hour  Intake 570 ml  Output 1450 ml  Net -880 ml   Filed Weights   02/15/19 0600  Weight: 81.2 kg    Examination: General appearance: Thin elderly adult male, alert and in no acute distress.   HEENT: Anicteric, conjunctiva pink, lids and lashes normal. No nasal deformity, discharge, epistaxis.  Lips moist, teeth normal. OP moist, no oral lesions.   Skin: Warm and dry.  No suspicious rashes or lesions. Cardiac: RRR, no murmurs appreciated.  JVP normal, no LE edema.    Respiratory: Normal respiratory rate and rhythm.  CTAB without rales or wheezes. Abdomen: Abdomen soft.  No tenderness palpation or guarding. No ascites, distension, hepatosplenomegaly.   MSK: The right leg is foreshortened and externally rotated. Neuro: Awake and alert. Naming is grossly intact, and the patient's recall, recent and remote, as well as general fund of knowledge seem within normal limits.  Muscle tone normal in upper extremities, without fasciculations.  Moves upper extremities equally and with normal coordination.  Lower extremity strength not tested. Speech fluent.  Psych: Sensorium intact and responding to questions, attention normal. Affect normal.  Judgment and insight appear normal.       Data Reviewed: I have personally reviewed following labs and imaging studies:  CBC: Recent Labs  Lab 02/14/19 1135 02/15/19 0420 02/16/19 0524  WBC 6.5 6.3 6.9  HGB 10.9* 9.3* 9.1*  HCT 34.6* 29.3* 27.8*  MCV 101.2* 100.7* 98.6  PLT 167 151 141*   Basic  Metabolic Panel: Recent Labs  Lab 02/14/19 1135 02/15/19 0420 02/16/19 0524  NA 137 137 135  K 5.0 4.6 4.7  CL 110 108 108  CO2 21* 20* 19*  GLUCOSE 104* 86 102*  BUN 37* 35* 39*  CREATININE 2.15* 2.08* 2.23*  CALCIUM 9.0 8.3* 8.4*   GFR: Estimated Creatinine Clearance: 22 mL/min (A) (by C-G formula based on SCr of 2.23 mg/dL (H)). Liver Function Tests: No results for input(s): AST, ALT, ALKPHOS, BILITOT, PROT, ALBUMIN in the last 168 hours. No results for input(s): LIPASE, AMYLASE in the last 168 hours. No results for input(s): AMMONIA in the last 168 hours. Coagulation Profile: Recent Labs  Lab 02/14/19 1648 02/15/19 0420 02/15/19 1539 02/16/19 0524  INR 3.5* 4.4* 4.6* 1.7*   Cardiac Enzymes: No results for input(s): CKTOTAL, CKMB, CKMBINDEX, TROPONINI in the last 168 hours. BNP (last 3 results) No results for input(s): PROBNP in the last 8760 hours. HbA1C: No results for input(s): HGBA1C in the last 72 hours. CBG: No results for input(s): GLUCAP in the last 168 hours. Lipid Profile: No results for input(s): CHOL, HDL, LDLCALC, TRIG, CHOLHDL, LDLDIRECT in the last 72 hours. Thyroid Function Tests: No results for input(s): TSH, T4TOTAL, FREET4, T3FREE, THYROIDAB in the last 72 hours. Anemia Panel: No results for input(s): VITAMINB12, FOLATE, FERRITIN, TIBC, IRON, RETICCTPCT in the last 72 hours. Urine analysis:    Component Value Date/Time   COLORURINE YELLOW (A) 02/14/2019 1141   APPEARANCEUR CLEAR (A) 02/14/2019 1141   APPEARANCEUR Cloudy (A) 02/10/2019 1341   LABSPEC 1.015 02/14/2019 1141   PHURINE 5.0 02/14/2019 1141   GLUCOSEU NEGATIVE 02/14/2019 1141   HGBUR MODERATE (A) 02/14/2019 1141   BILIRUBINUR NEGATIVE 02/14/2019 1141   BILIRUBINUR Negative 02/10/2019 1341   KETONESUR NEGATIVE 02/14/2019 1141   PROTEINUR 100 (A) 02/14/2019 1141   UROBILINOGEN 0.2 12/09/2016 1131   NITRITE NEGATIVE 02/14/2019 1141   LEUKOCYTESUR LARGE (A) 02/14/2019 1141    Sepsis Labs: @LABRCNTIP (procalcitonin:4,lacticacidven:4)  ) Recent Results (from the past 240 hour(s))  Microscopic Examination     Status: Abnormal   Collection Time: 02/10/19  1:41 PM   URINE  Result Value Ref Range Status   WBC, UA >30 (A) 0 - 5 /hpf Final   RBC 11-30 (A) 0 - 2 /hpf Final   Epithelial Cells (non renal) 0-10 0 - 10 /hpf Final   Crystals Present (A) N/A Final   Crystal Type Amorphous Sediment N/A Final   Bacteria, UA Many (A) None seen/Few Final  Urine culture     Status: Abnormal   Collection Time: 02/14/19 11:41 AM   Specimen: Urine, Random  Result Value Ref Range Status   Specimen Description   Final    URINE, RANDOM Performed at [facility], [address], [place], [place] [address]    Special Requests   Final    NONE Performed at [facility], [address], [place], [place] [address]    Culture (A)  Final    <10,000 COLONIES/mL INSIGNIFICANT GROWTH Performed at [facility],  [address] [place], [place] [address]    Report Status 02/15/2019 FINAL  Final  SARS CORONAVIRUS 2 (TAT 6-24 HRS) Nasopharyngeal Nasopharyngeal Swab     Status: None   Collection Time: 02/14/19  3:36 PM   Specimen: Nasopharyngeal Swab  Result Value Ref Range Status   SARS Coronavirus 2 NEGATIVE NEGATIVE Final    Comment: (NOTE) SARS-CoV-2 target nucleic acids are NOT DETECTED. The SARS-CoV-2 RNA is generally detectable in upper and lower respiratory specimens during the acute phase of infection. Negative results do not preclude SARS-CoV-2 infection, do not rule out co-infections with other pathogens, and should not be used as the sole basis for treatment or other patient management decisions. Negative results must be combined with clinical observations, patient history, and epidemiological information. The expected result is Negative. Fact Sheet for Patients: SugarRoll.be Fact Sheet for Healthcare  Providers: https://www.woods-mathews.com/ This test is not yet approved or cleared by the [place] FDA and  has been authorized for detection and/or diagnosis of SARS-CoV-2 by FDA under an Emergency Use Authorization (EUA). This EUA will remain  in effect (meaning this test can be used) for the duration of the COVID-19 declaration under Section 56 4(b)(1) of the Act, 21 U.S.C. section 360bbb-3(b)(1), unless the authorization is terminated or revoked sooner. Performed at [facility], [place] [address], [place], [place] [address]          Radiology Studies: Dg Chest 1 View  Result Date: 02/14/2019 CLINICAL DATA:  Fall with hip fracture EXAM: CHEST  1 VIEW COMPARISON:  11/06/2016 FINDINGS: Left-sided pacing device as before. Suspected skin fold artifact over the left lower chest. Hyperinflated lungs. No consolidation or pleural effusion. Stable cardiomediastinal silhouette. Aortic atherosclerosis. No pneumothorax. IMPRESSION: No active disease. Electronically Signed   By: Donavan Foil M.D.   On: 02/14/2019 15:33   Ct Head Wo Contrast  Result Date: 02/14/2019 CLINICAL DATA:  Ataxia after fall hitting head on side of a chair. Patient is anticoagulated. EXAM: CT HEAD WITHOUT CONTRAST TECHNIQUE: Contiguous axial images were obtained from the base of the skull through the vertex without intravenous contrast. COMPARISON:  08/19/2005 FINDINGS: Brain: No evidence of acute infarction, hemorrhage, hydrocephalus, extra-axial collection or mass lesion/mass effect. Prominence of the sulci and ventricles noted. There is mild diffuse low-attenuation within the subcortical and periventricular white matter compatible with chronic microvascular disease. Vascular: No hyperdense vessel or unexpected calcification. Skull: Normal. Negative for fracture or focal lesion. Sinuses/Orbits: The paranasal sinuses are clear. Partial opacification of the mastoid air cells. Other: None IMPRESSION: 1.  No acute intracranial abnormality. 2. Chronic small vessel ischemic change and brain atrophy. 3. Mild bilateral mastoid air cell effusions. Electronically Signed   By: Kerby Moors M.D.   On: 02/14/2019 12:07   Dg Hip Unilat W Or Wo Pelvis 2-3 Views Right  Result Date: 02/14/2019 CLINICAL DATA:  Right hip pain after fall EXAM: DG HIP (WITH OR WITHOUT PELVIS) 2-3V RIGHT COMPARISON:  CT 12/10/2016 FINDINGS: Catheter overlying the pelvis. Post treatment changes in the region of the prostate. Moderate arthritis of the left hip. Acute right femoral neck fracture with mild cephalad migration of the trochanter. IMPRESSION: Acute right femoral neck fracture Electronically Signed   By: Donavan Foil M.D.   On: 02/14/2019 15:31        Scheduled Meds:  amiodarone  100 mg Oral QHS   Chlorhexidine Gluconate Cloth  6 each Topical Daily   dorzolamide-timolol  1 drop Both Eyes BID   ferrous  sulfate  325 mg Oral Q breakfast   furosemide  40 mg Oral QODAY   levothyroxine  100 mcg Oral Q0600   multivitamin with minerals  1 tablet Oral Daily   pantoprazole  40 mg Oral Daily   pravastatin  20 mg Oral QHS   tamsulosin  0.4 mg Oral QPC supper   Continuous Infusions:   ceFAZolin (ANCEF) IV       LOS: 2 days    Time spent: 25 minutes    Edwin Dada, MD [facility] 02/16/2019, 10:36 AM     Please page though [place] or Epic secure chat:  For [employer], Adult nurse

## 2019-02-16 NOTE — Plan of Care (Signed)
Patient denies pain, reports only with movement.  Verbalizes understanding that surgery today depends on inr result.  Call bell in reach.

## 2019-02-16 NOTE — Anesthesia Procedure Notes (Signed)
Procedure Name: LMA Insertion Date/Time: 02/16/2019 1:56 PM Performed by: Rolla Plate, CRNA Pre-anesthesia Checklist: Patient identified, Patient being monitored, Timeout performed, Emergency Drugs available and Suction available Patient Re-evaluated:Patient Re-evaluated prior to induction Oxygen Delivery Method: Circle system utilized Preoxygenation: Pre-oxygenation with 100% oxygen Induction Type: IV induction Ventilation: Mask ventilation without difficulty LMA: LMA inserted LMA Size: 4.5 Tube type: Oral Number of attempts: 1 Placement Confirmation: positive ETCO2 and breath sounds checked- equal and bilateral Tube secured with: Tape Dental Injury: Teeth and Oropharynx as per pre-operative assessment

## 2019-02-17 ENCOUNTER — Encounter: Payer: Self-pay | Admitting: Orthopedic Surgery

## 2019-02-17 DIAGNOSIS — Z7901 Long term (current) use of anticoagulants: Secondary | ICD-10-CM

## 2019-02-17 DIAGNOSIS — S72001A Fracture of unspecified part of neck of right femur, initial encounter for closed fracture: Secondary | ICD-10-CM

## 2019-02-17 DIAGNOSIS — I4811 Longstanding persistent atrial fibrillation: Secondary | ICD-10-CM

## 2019-02-17 LAB — CBC
HCT: 26.9 % — ABNORMAL LOW (ref 39.0–52.0)
Hemoglobin: 8.8 g/dL — ABNORMAL LOW (ref 13.0–17.0)
MCH: 32 pg (ref 26.0–34.0)
MCHC: 32.7 g/dL (ref 30.0–36.0)
MCV: 97.8 fL (ref 80.0–100.0)
Platelets: 144 10*3/uL — ABNORMAL LOW (ref 150–400)
RBC: 2.75 MIL/uL — ABNORMAL LOW (ref 4.22–5.81)
RDW: 12.7 % (ref 11.5–15.5)
WBC: 9.7 10*3/uL (ref 4.0–10.5)
nRBC: 0 % (ref 0.0–0.2)

## 2019-02-17 LAB — PROTIME-INR
INR: 1.3 — ABNORMAL HIGH (ref 0.8–1.2)
Prothrombin Time: 15.9 seconds — ABNORMAL HIGH (ref 11.4–15.2)

## 2019-02-17 LAB — BASIC METABOLIC PANEL
Anion gap: 6 (ref 5–15)
BUN: 36 mg/dL — ABNORMAL HIGH (ref 8–23)
CO2: 18 mmol/L — ABNORMAL LOW (ref 22–32)
Calcium: 8.1 mg/dL — ABNORMAL LOW (ref 8.9–10.3)
Chloride: 110 mmol/L (ref 98–111)
Creatinine, Ser: 2.14 mg/dL — ABNORMAL HIGH (ref 0.61–1.24)
GFR calc Af Amer: 30 mL/min — ABNORMAL LOW (ref >60)
GFR calc non Af Amer: 26 mL/min — ABNORMAL LOW (ref >60)
Glucose, Bld: 232 mg/dL — ABNORMAL HIGH (ref 70–99)
Potassium: 5.4 mmol/L — ABNORMAL HIGH (ref 3.5–5.1)
Sodium: 134 mmol/L — ABNORMAL LOW (ref 135–145)

## 2019-02-17 MED ORDER — WARFARIN SODIUM 1 MG PO TABS
1.5000 mg | ORAL_TABLET | Freq: Once | ORAL | Status: AC
  Administered 2019-02-17: 1.5 mg via ORAL
  Filled 2019-02-17: qty 1

## 2019-02-17 MED ORDER — FUROSEMIDE 40 MG PO TABS: 40.0000 mg | ORAL_TABLET | ORAL | Status: DC

## 2019-02-17 MED ORDER — ACETAMINOPHEN 500 MG PO TABS
1000.0000 mg | ORAL_TABLET | Freq: Three times a day (TID) | ORAL | Status: DC
  Administered 2019-02-17 – 2019-02-20 (×8): 1000 mg via ORAL
  Filled 2019-02-17 (×8): qty 2

## 2019-02-17 MED ORDER — FUROSEMIDE 40 MG PO TABS
40.0000 mg | ORAL_TABLET | ORAL | Status: DC
  Administered 2019-02-18 – 2019-02-20 (×2): 40 mg via ORAL
  Filled 2019-02-17 (×3): qty 1

## 2019-02-17 MED ORDER — ACETAMINOPHEN 500 MG PO TABS: 1000.0000 mg | ORAL_TABLET | Freq: Three times a day (TID) | ORAL | Status: DC

## 2019-02-17 MED ORDER — WARFARIN - PHARMACIST DOSING INPATIENT: Freq: Every day | Status: DC

## 2019-02-17 NOTE — NC FL2 (Signed)
[place] LEVEL OF CARE SCREENING TOOL     IDENTIFICATION  Patient Name: [name] [name] Birthdate: Aug 03, 1922 Sex: male Admission Date (Current Location): 02/14/2019  [place] and [place] Number:  Engineering geologist and Address:  [facility], [address], [place], [place] [address]      Provider Number: [id number]  Attending Physician Name and Address:  Edwin Dada, *  Relative Name and Phone Number:  Chrissie Noa [phone]    Current Level of Care: Hospital Recommended Level of Care: [place] Prior Approval Number:    Date Approved/Denied:   PASRR Number: [id number]  Discharge Plan: SNF    Current Diagnoses: Patient Active Problem List   Diagnosis Date Noted  . Closed fracture of neck of right femur ([place])   . Longstanding persistent atrial fibrillation ([place])   . Warfarin anticoagulation   . Closed right hip fracture, initial encounter ([place]) 02/14/2019  . Pre-op evaluation   . Benign prostatic hyperplasia   . Glaucoma   . Chronic renal failure, stage 3b 04/13/2017  . DDD (degenerative disc disease), cervical, Severe 06/09/2013  . Neck arthritis, Severe, multi-level 06/09/2013  . Sinoatrial node dysfunction ([name])   . Long term (current) use of anticoagulants 06/24/2010  . IRON DEFICIENCY 04/17/2010  . GLAUCOMA 08/07/2008  . GERD 08/07/2008  . PACEMAKER, PERMANENT 08/07/2008  . Vitamin B 12 deficiency 07/19/2008  . PEPTIC ULCER DISEASE 07/18/2008  . RADIATION PROCTITIS 07/18/2008  . DIVERTICULOSIS, COLON 07/18/2008  . Prostate cancer, in remission 07/18/2008  . Essential hypertension 05/09/2008  . Hyperlipidemia 02/06/2008  . AF (paroxysmal atrial fibrillation) ([place]) 02/06/2008  . Hypothyroidism 12/01/2007  . HERN UNS SITE ABD CAV W/O MENTION OBST/GANGREN 12/01/2007    Orientation RESPIRATION BLADDER Height & Weight     Self, Time, Situation, Place  Normal Continent Weight:  81.2 kg Height:  5\' 11"  (180.3 cm)  BEHAVIORAL SYMPTOMS/MOOD NEUROLOGICAL BOWEL NUTRITION STATUS      Continent Diet  AMBULATORY STATUS COMMUNICATION OF NEEDS Skin   Limited Assist Verbally Normal                       Personal Care Assistance Level of Assistance  Bathing, Feeding, Dressing Bathing Assistance: Limited assistance Feeding assistance: Independent Dressing Assistance: Limited assistance     Functional Limitations Info  Hearing   Hearing Info: Impaired(hearing aids)      SPECIAL CARE FACTORS FREQUENCY  PT (By licensed PT), OT (By licensed OT)     PT Frequency: 5 times per week OT Frequency: 5 times per week            Contractures Contractures Info: Not present    Additional Factors Info  Code Status, Allergies Code Status Info: DNR Allergies Info: PCN           Current Medications (02/17/2019):  This is the current hospital active medication list Current Facility-Administered Medications  Medication Dose Route Frequency Provider Last Rate Last Dose  . alum & mag hydroxide-simeth (MAALOX/MYLANTA) 200-200-20 MG/5ML suspension 30 mL  30 mL Oral Q4H PRN Hessie Knows, MD      . amiodarone (PACERONE) tablet 100 mg  100 mg Oral QHS Hessie Knows, MD   100 mg at 02/16/19 2207  . bisacodyl (DULCOLAX) suppository 10 mg  10 mg Rectal Daily PRN Hessie Knows, MD      . Chlorhexidine Gluconate Cloth 2 % PADS 6 each  6 each Topical Daily Hessie Knows, MD  6 each at 02/16/19 1030  . diphenhydrAMINE (BENADRYL) 12.5 MG/5ML elixir 12.5-25 mg  12.5-25 mg Oral Q4H PRN Hessie Knows, MD      . docusate sodium (COLACE) capsule 100 mg  100 mg Oral BID PRN Hessie Knows, MD      . docusate sodium (COLACE) capsule 100 mg  100 mg Oral BID Hessie Knows, MD   100 mg at 02/17/19 0834  . dorzolamide-timolol (COSOPT) 22.3-6.8 MG/ML ophthalmic solution 1 drop  1 drop Both Eyes BID Hessie Knows, MD   1 drop at 02/17/19 0834  . ferrous sulfate tablet 325 mg  325 mg Oral Q  breakfast Hessie Knows, MD   325 mg at 02/17/19 9449  . furosemide (LASIX) tablet 40 mg  40 mg Oral Ceasar Lund, MD      . levothyroxine (SYNTHROID) tablet 100 mcg  100 mcg Oral Q0600 Hessie Knows, MD   100 mcg at 02/17/19 0546  . magnesium citrate solution 1 Bottle  1 Bottle Oral Once PRN Hessie Knows, MD      . magnesium hydroxide (MILK OF MAGNESIA) suspension 30 mL  30 mL Oral Daily PRN Hessie Knows, MD      . menthol-cetylpyridinium (CEPACOL) lozenge 3 mg  1 lozenge Oral PRN Hessie Knows, MD       Or  . phenol (CHLORASEPTIC) mouth spray 1 spray  1 spray Mouth/Throat PRN Hessie Knows, MD      . methocarbamol (ROBAXIN) tablet 500 mg  500 mg Oral Q6H PRN Hessie Knows, MD       Or  . methocarbamol (ROBAXIN) 500 mg in dextrose 5 % 50 mL IVPB  500 mg Intravenous Q6H PRN Hessie Knows, MD      . metoCLOPramide (REGLAN) tablet 5-10 mg  5-10 mg Oral Q8H PRN Hessie Knows, MD       Or  . metoCLOPramide (REGLAN) injection 5-10 mg  5-10 mg Intravenous Q8H PRN Hessie Knows, MD      . morphine 2 MG/ML injection 2 mg  2 mg Intravenous Q4H PRN Hessie Knows, MD      . multivitamin with minerals tablet 1 tablet  1 tablet Oral Daily Hessie Knows, MD   1 tablet at 02/17/19 [phone]  . ondansetron (ZOFRAN) tablet 4 mg  4 mg Oral Q6H PRN Hessie Knows, MD       Or  . ondansetron [facility]) injection 4 mg  4 mg Intravenous Q6H PRN Hessie Knows, MD      . oxyCODONE (Oxy IR/ROXICODONE) immediate release tablet 5 mg  5 mg Oral Q4H PRN Hessie Knows, MD   5 mg at 02/16/19 2207  . pantoprazole (PROTONIX) EC tablet 40 mg  40 mg Oral Daily Hessie Knows, MD   40 mg at 02/17/19 0834  . polyethylene glycol (MIRALAX / GLYCOLAX) packet 17 g  17 g Oral Daily PRN Hessie Knows, MD      . pravastatin (PRAVACHOL) tablet 20 mg  20 mg Oral QHS Hessie Knows, MD   20 mg at 02/16/19 2207  . tamsulosin (FLOMAX) capsule 0.4 mg  0.4 mg Oral QPC supper Hessie Knows, MD   0.4 mg at 02/16/19 1812  . warfarin (COUMADIN)  tablet 1.5 mg  1.5 mg Oral ONCE-1800 Rito Ehrlich A, RPH      . Warfarin - Pharmacist Dosing Inpatient   Does not apply q1800 Pearla Dubonnet, RPH      . zolpidem (AMBIEN) tablet 5 mg  5 mg Oral QHS PRN Hessie Knows,  MD         Discharge Medications: Please see discharge summary for a list of discharge medications.  Relevant Imaging Results:  Relevant Lab Results:   Additional Information SS# [id number]  Shelbie Hutching, RN

## 2019-02-17 NOTE — Progress Notes (Signed)
PROGRESS NOTE    [name] [name]  FGH:[id number] DOB: 06/10/1922 DOA: 02/14/2019 PCP: [name] Loffler, MD      Brief Narrative:  [name] [name] is a 85 y.o. M with hx pAF on amiodarone and warfarin, bradycardia s/p PPM, CKD IV baseline 1.9-2.2, prostate Ca remote, who presented with fall and right hip pain.  In the ER, radiographs showed evidence of right hip fracture.  Orthopedics were consulted and recommended operative repair.        Assessment & Plan:  Acute right femoral neck fracture S/p right anterior hip hemiarthroplasty 11/19 by [name] [name] Mechanical fall, right femoral neck fracture.  Surgery delayed by elevated and difficult to reverse INR.  Underwent arthroplasty/repair on 67/89, uncomplicated. Post-op pain controlled.  Minimal hemoglobin loss, creatinine stable. -Consult orthopedics, appreciate recommendations -PT eval -Continue oxycodone or morphine as needed for pain    Atrial fibrillation, paroxysmal Pacemaker in place Rate controlled. -Restart warfarin -Continue amiodarone -Daily INR  Chronic kidney disease stage IV Creatinine stable relative to baseline -Continue furosemide -Continue pravastatin  Anemia of chronic kidney disease Hemoglobin slight drop, expected postop -Continue oral iron  Hypothyroidism -Continue levothyroxine  BPH -Continue Flomax  GERD -Continue pantoprazole        MDM and disposition: The below labs and imaging reports reviewed and summarized above.  Medication management as above.   The patient was admitted with acute right hip fracture.    His INR was corrected and he underwent hemiarthroplasty 38/10 uncomplicated.  We will now work with physical therapy for placement.      DVT prophylaxis: Not applicable, on warfarin Code Status: DNR Family Communication:      Consultants:   Orthopedics  Procedures:   11/19 right anterior hemiarthroplasty  Antimicrobials:       Subjective: His leg pain  is much improved.  He has no confusion, fever, cough, dyspnea, chest pain, palpitations, nausea, vomiting.  Objective: Vitals:   02/16/19 2151 02/17/19 0011 02/17/19 0420 02/17/19 0728  BP: 108/63 106/64 (!) 106/58 107/66  Pulse: (!) 59 63 (!) 59 85  Resp: 16 17 17 16   Temp: 97.6 F (36.4 C) 97.8 F (36.6 C) (!) 97.4 F (36.3 C) 98 F (36.7 C)  TempSrc: Oral Oral Oral Oral  SpO2: 100% 100% 99% 98%  Weight:      Height:        Intake/Output Summary (Last 24 hours) at 02/17/2019 0734 Last data filed at 02/17/2019 0500 Gross per 24 hour  Intake 1320 ml  Output 1325 ml  Net -5 ml   Filed Weights   02/15/19 0600  Weight: 81.2 kg    Examination: General appearance: Thin elderly adult male, alert and in no acute distress.  Lying in bed.  Interactive. HEENT: Anicteric, conjunctiva pink, lids and lashes normal. No nasal deformity, discharge, epistaxis.  Lips moist, OP moist, no oral lesions.   Skin: Warm and dry.  No suspicious rashes or lesions. Cardiac: RRR, no murmurs appreciated.  JVP normal, no LE edema.    Respiratory: Normal respiratory rate and rhythm.  CTAB without rales or wheezes. Abdomen: Abdomen soft.  No tenderness to palpation or guarding. No ascites, distension, hepatosplenomegaly.   MSK: No deformities or effusions of the large joints of the upper or lower extremities bilaterally.  Diffuse loss of subcutaneous muscle mass and fat. Neuro: Awake and alert. Naming is grossly intact, and the patient's recall, recent and remote, as well as general fund of knowledge seem within normal limits.  Muscle tone normal  for age, without fasciculations.  Moves all extremities equally with generalized weakness and with normal coordination.  Marland Kitchen Speech fluent.    Psych: Sensorium intact and responding to questions, attention normal. Affect normal.  Judgment and insight appear normal.         Data Reviewed: I have personally reviewed following labs and imaging studies:  CBC:  Recent Labs  Lab February 23, 2019 1135 02/15/19 0420 02/16/19 0524 02/17/19 0350  WBC 6.5 6.3 6.9 9.7  HGB 10.9* 9.3* 9.1* 8.8*  HCT 34.6* 29.3* 27.8* 26.9*  MCV 101.2* 100.7* 98.6 97.8  PLT 167 151 141* 979*   Basic Metabolic Panel: Recent Labs  Lab Feb 23, 2019 1135 02/15/19 0420 02/16/19 0524 02/17/19 0350  NA 137 137 135 134*  K 5.0 4.6 4.7 5.4*  CL 110 108 108 110  CO2 21* 20* 19* 18*  GLUCOSE 104* 86 102* 232*  BUN 37* 35* 39* 36*  CREATININE 2.15* 2.08* 2.23* 2.14*  CALCIUM 9.0 8.3* 8.4* 8.1*   GFR: Estimated Creatinine Clearance: 23 mL/min (A) (by C-G formula based on SCr of 2.14 mg/dL (H)). Liver Function Tests: No results for input(s): AST, ALT, ALKPHOS, BILITOT, PROT, ALBUMIN in the last 168 hours. No results for input(s): LIPASE, AMYLASE in the last 168 hours. No results for input(s): AMMONIA in the last 168 hours. Coagulation Profile: Recent Labs  Lab 02/15/19 0420 02/15/19 1539 02/16/19 0524 02/16/19 1145 02/17/19 0350  INR 4.4* 4.6* 1.7* 1.5* 1.3*   Cardiac Enzymes: No results for input(s): CKTOTAL, CKMB, CKMBINDEX, TROPONINI in the last 168 hours. BNP (last 3 results) No results for input(s): PROBNP in the last 8760 hours. HbA1C: No results for input(s): HGBA1C in the last 72 hours. CBG: No results for input(s): GLUCAP in the last 168 hours. Lipid Profile: No results for input(s): CHOL, HDL, LDLCALC, TRIG, CHOLHDL, LDLDIRECT in the last 72 hours. Thyroid Function Tests: No results for input(s): TSH, T4TOTAL, FREET4, T3FREE, THYROIDAB in the last 72 hours. Anemia Panel: No results for input(s): VITAMINB12, FOLATE, FERRITIN, TIBC, IRON, RETICCTPCT in the last 72 hours. Urine analysis:    Component Value Date/Time   COLORURINE YELLOW (A) 2019-02-23 1141   APPEARANCEUR CLEAR (A) 2019-02-23 1141   APPEARANCEUR Cloudy (A) 02/10/2019 1341   LABSPEC 1.015 2019-02-23 1141   PHURINE 5.0 02/23/2019 1141   GLUCOSEU NEGATIVE 2019/02/23 1141   HGBUR MODERATE  (A) 2019-02-23 1141   BILIRUBINUR NEGATIVE February 23, 2019 1141   BILIRUBINUR Negative 02/10/2019 1341   KETONESUR NEGATIVE Feb 23, 2019 1141   PROTEINUR 100 (A) 23-Feb-2019 1141   UROBILINOGEN 0.2 12/09/2016 1131   NITRITE NEGATIVE February 23, 2019 1141   LEUKOCYTESUR LARGE (A) February 23, 2019 1141   Sepsis Labs: @LABRCNTIP (procalcitonin:4,lacticacidven:4)  ) Recent Results (from the past 240 hour(s))  Microscopic Examination     Status: Abnormal   Collection Time: 02/10/19  1:41 PM   URINE  Result Value Ref Range Status   WBC, UA >30 (A) 0 - 5 /hpf Final   RBC 11-30 (A) 0 - 2 /hpf Final   Epithelial Cells (non renal) 0-10 0 - 10 /hpf Final   Crystals Present (A) N/A Final   Crystal Type Amorphous Sediment N/A Final   Bacteria, UA Many (A) None seen/Few Final  Urine culture     Status: Abnormal   Collection Time: February 23, 2019 11:41 AM   Specimen: Urine, Random  Result Value Ref Range Status   Specimen Description   Final    URINE, RANDOM Performed at [facility], [address] [place], [place]  [address]    Special Requests   Final    NONE Performed at [facility] [address] [place], [place] [address]    Culture (A)  Final    <10,000 COLONIES/mL INSIGNIFICANT GROWTH Performed at [facility] [address] [place], [place] [address]    Report Status 02/15/2019 FINAL  Final  SARS CORONAVIRUS 2 (TAT 6-24 HRS) Nasopharyngeal Nasopharyngeal Swab     Status: None   Collection Time: 02/14/19  3:36 PM   Specimen: Nasopharyngeal Swab  Result Value Ref Range Status   SARS Coronavirus 2 NEGATIVE NEGATIVE Final    Comment: (NOTE) SARS-CoV-2 target nucleic acids are NOT DETECTED. The SARS-CoV-2 RNA is generally detectable in upper and lower respiratory specimens during the acute phase of infection. Negative results do not preclude SARS-CoV-2 infection, do not rule out co-infections with other pathogens, and should not be used as the sole basis for treatment or  other patient management decisions. Negative results must be combined with clinical observations, patient history, and epidemiological information. The expected result is Negative. Fact Sheet for Patients: SugarRoll.be Fact Sheet for Healthcare Providers: https://www.woods-mathews.com/ This test is not yet approved or cleared by the [place] FDA and  has been authorized for detection and/or diagnosis of SARS-CoV-2 by FDA under an Emergency Use Authorization (EUA). This EUA will remain  in effect (meaning this test can be used) for the duration of the COVID-19 declaration under Section 56 4(b)(1) of the Act, 21 U.S.C. section 360bbb-3(b)(1), unless the authorization is terminated or revoked sooner. Performed at [facility], [place] [address] [place], [place] [address]          Radiology Studies: Dg Hip Operative Unilat W Or W/o Pelvis Right  Result Date: 02/16/2019 CLINICAL DATA:  Open reduction and internal fixation of proximal right femoral neck fracture. EXAM: OPERATIVE right HIP (WITH PELVIS IF PERFORMED) 2 VIEWS TECHNIQUE: Fluoroscopic spot image(s) were submitted for interpretation post-operatively. Radiation exposure index: 2.00 mGy. COMPARISON:  February 14, 2019. FINDINGS: Two intraoperative fluoroscopic images were obtained of the right hip. These demonstrate mildly displaced proximal right femoral neck fracture. IMPRESSION: Fluoroscopic guidance provided during open reduction and internal fixation of proximal right femoral neck fracture. Electronically Signed   By: Marijo Conception M.D.   On: 02/16/2019 15:33   Dg Hip Unilat With Pelvis 2-3 Views Right  Result Date: 02/16/2019 CLINICAL DATA:  Post RIGHT total hip replacement EXAM: DG HIP (WITH OR WITHOUT PELVIS) 2-3V RIGHT COMPARISON:  Preoperative images of 02/14/2019 FINDINGS: Interval resection of the RIGHT femoral head neck and placement of a RIGHT hip prosthesis. No  dislocation. No additional fractures identified. IMPRESSION: RIGHT hip prosthesis without acute complication. Electronically Signed   By: Lavonia Dana M.D.   On: 02/16/2019 17:25        Scheduled Meds: . amiodarone  100 mg Oral QHS  . Chlorhexidine Gluconate Cloth  6 each Topical Daily  . docusate sodium  100 mg Oral BID  . dorzolamide-timolol  1 drop Both Eyes BID  . ferrous sulfate  325 mg Oral Q breakfast  . furosemide  40 mg Oral QODAY  . levothyroxine  100 mcg Oral Q0600  . multivitamin with minerals  1 tablet Oral Daily  . pantoprazole  40 mg Oral Daily  . pantoprazole  40 mg Oral Daily  . pravastatin  20 mg Oral QHS  . tamsulosin  0.4 mg Oral QPC supper   Continuous Infusions: . methocarbamol (ROBAXIN) IV  LOS: 3 days    Time spent: 25 minutes    Edwin Dada, MD [facility] 02/17/2019, 7:34 AM     Please page though [place] or Epic secure chat:  For [employer], Adult nurse

## 2019-02-17 NOTE — Evaluation (Signed)
Occupational Therapy Evaluation Patient Details Name: [name] [name] MRN: [id number] DOB: 1923-03-28 Today's Date: 02/17/2019    History of Present Illness 85 y/o male s/p fall with R hip fracture, s/p anterior approach hemiarthroplasty 11/19.   Clinical Impression   Pt seen for OT evaluation this date, POD#1 from above surgery. Pt was independent in all ADL prior to surgery and not using AD for mobility. Pt is eager to return to PLOF with less pain and improved safety and independence. Pt currently requires mod assist for LB dressing and bathing while in seated position due to pain and limited AROM of R hip. Pt instructed in self care skills, falls prevention strategies, home/routines modifications, DME/AE for LB bathing and dressing tasks, and compression stocking mgt strategies. Pt instructed in AE for LB ADL with pt able to return demo use of reacher for donning pants with Mod A and Max verbal cues for sequencing. Pt would benefit from additional instruction in self care skills and techniques to help maintain precautions with or without assistive devices to support recall and carryover prior to discharge. Recommend SNF upon discharge.     Follow Up Recommendations  SNF    Equipment Recommendations  3 in 1 bedside commode    Recommendations for Other Services       Precautions / Restrictions Precautions Precautions: Anterior Hip;Fall Restrictions Weight Bearing Restrictions: Yes RLE Weight Bearing: Weight bearing as tolerated      Mobility Bed Mobility Overal bed mobility: Needs Assistance Bed Mobility: Supine to Sit     Supine to sit: Mod assist     General bed mobility comments: deferred,  up in recliner  Transfers Overall transfer level: Needs assistance Equipment used: Rolling walker (2 wheeled) Transfers: Sit to/from Stand Sit to Stand: Min assist;Mod assist         General transfer comment: Pt needed verbal cues as well as close tactile cuing to initiate  getting to standing, did need light assist to attain upright in walker    Balance Overall balance assessment: Needs assistance Sitting-balance support: Bilateral upper extremity supported Sitting balance-Leahy Scale: Poor Sitting balance - Comments: Pt initially unable to even maintain sitting EOB, once PT assisted to position and scoot to appropriate position he was able to maintain with b/l UE support   Standing balance support: Bilateral upper extremity supported Standing balance-Leahy Scale: Fair Standing balance comment: Pt with no LOBs, but highly reliant on the walker and showed consistent unsteadiness.                           ADL either performed or assessed with clinical judgement   ADL Overall ADL's : Needs assistance/impaired                                       General ADL Comments: Mod A for LB ADL, Min-Mod A for BSC transfers w/ RW     Vision Patient Visual Report: No change from baseline       Perception     Praxis      Pertinent Vitals/Pain Pain Assessment: 0-10 Pain Score: 4  Pain Location: R hip Pain Descriptors / Indicators: Aching Pain Intervention(s): Limited activity within patient's tolerance;Monitored during session;Repositioned     Hand Dominance     Extremity/Trunk Assessment Upper Extremity Assessment Upper Extremity Assessment: Overall WFL for tasks assessed(age appropriate limitations in functional  range)   Lower Extremity Assessment Lower Extremity Assessment: Defer to PT evaluation(expected R LE post-op weakness, otherwise functional t/o)       Communication Communication Communication: No difficulties   Cognition Arousal/Alertness: Awake/alert Behavior During Therapy: WFL for tasks assessed/performed Overall Cognitive Status: Within Functional Limits for tasks assessed                                 General Comments: mild difficulty with higher level sequencing during AE instruction  for LB dressing, requiring more verbal cues for sequencing   General Comments       Exercises  Other Exercises: instructed in AE/DME for LB ADL with pt able to return demo use of reacher for donning pants with Mod A and Max verbal cues for sequencing   Shoulder Instructions      Home Living Family/patient expects to be discharged to:: Skilled nursing facility Living Arrangements: Children(daughter has been staying with him and can continue)                                      Prior Functioning/Environment Level of Independence: Independent        Comments: apparently he "should have been using AD" but was able to do some limited driving and take care of ADLs, etc w/o assist        OT Problem List: Decreased strength;Decreased range of motion;Pain;Impaired balance (sitting and/or standing);Decreased knowledge of use of DME or AE      OT Treatment/Interventions: Self-care/ADL training;Therapeutic exercise;Therapeutic activities;DME and/or AE instruction;Patient/family education;Balance training    OT Goals(Current goals can be found in the care plan section) Acute Rehab OT Goals Patient Stated Goal: get stronger at rehab and go home OT Goal Formulation: With patient Time For Goal Achievement: 03/03/19 Potential to Achieve Goals: Good ADL Goals Pt Will Perform Lower Body Dressing: with min assist;sit to/from stand Pt Will Transfer to Toilet: with min guard assist;ambulating;with min assist;bedside commode  OT Frequency: Min 1X/week   Barriers to D/C:            Co-evaluation              AM-PAC OT "6 Clicks" Daily Activity     Outcome Measure Help from another person eating meals?: None Help from another person taking care of personal grooming?: None Help from another person toileting, which includes using toliet, bedpan, or urinal?: A Lot Help from another person bathing (including washing, rinsing, drying)?: A Lot Help from another person to  put on and taking off regular upper body clothing?: A Little Help from another person to put on and taking off regular lower body clothing?: A Lot 6 Click Score: 17   End of Session    Activity Tolerance: Patient tolerated treatment well Patient left: in chair;with call bell/phone within reach;with chair alarm set;with SCD's reapplied  OT Visit Diagnosis: Other abnormalities of gait and mobility (R26.89);Muscle weakness (generalized) (M62.81);History of falling (Z91.81);Pain Pain - Right/Left: Right Pain - part of body: Hip                Time: 6734-1937 OT Time Calculation (min): 21 min Charges:  OT General Charges $OT Visit: 1 Visit OT Evaluation $OT Eval Moderate Complexity: 1 Mod OT Treatments $Self Care/Home Management : 8-22 mins  Jeni Salles, MPH, MS, OTR/L ascom [phone] 02/17/19, 2:49 PM

## 2019-02-17 NOTE — Consult Note (Addendum)
ANTICOAGULATION CONSULT NOTE - Initial Consult  Pharmacy Consult for Warfarin Dosing Indication: atrial fibrillation  Allergies  Allergen Reactions  . Penicillins Rash and Other (See Comments)    Has patient had a PCN reaction causing immediate rash, facial/tongue/throat swelling, SOB or lightheadedness with hypotension: {no Has patient had a PCN reaction causing severe rash involving mucus membranes or skin necrosis: {no Has patient had a PCN reaction that required hospitalization {no Has patient had a PCN reaction occurring within the last 10 years: {no If all of the above answers are "NO", then may proceed with Cephalosporin use.    Patient Measurements: Height: 5\' 11"  (180.3 cm) Weight: 179 lb (81.2 kg) IBW/kg (Calculated) : 75.3 Heparin Dosing Weight: 60.8 kg  Vital Signs: Temp: 98 F (36.7 C) (11/20 0728) Temp Source: Oral (11/20 0728) BP: 107/66 (11/20 0728) Pulse Rate: 85 (11/20 0728)  Labs: Recent Labs    02/15/19 0420  02/16/19 0524 02/16/19 1145 02/17/19 0350  HGB 9.3*  --  9.1*  --  8.8*  HCT 29.3*  --  27.8*  --  26.9*  PLT 151  --  141*  --  144*  LABPROT 42.4*   < > 19.7* 17.6* 15.9*  INR 4.4*   < > 1.7* 1.5* 1.3*  CREATININE 2.08*  --  2.23*  --  2.14*   < > = values in this interval not displayed.    Estimated Creatinine Clearance: 23 mL/min (A) (by C-G formula based on SCr of 2.14 mg/dL (H)).   Medical History: Past Medical History:  Diagnosis Date  . AICD (automatic cardioverter/defibrillator) present    PACEMAKER  . Anemia   . Cancer ([facility])   . Cardiac pacemaker in situ 07/2005   symptomatic bradycardia  . CKD (chronic kidney disease) stage 3, GFR 30-59 ml/min 04/13/2017  . Diverticulosis of colon (without mention of hemorrhage)   . Dysrhythmia   . 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 GERD (gastroesophageal reflux disease)   . GI hemorrhage   . Hyperlipidemia   .  Hypertension   . Lower extremity edema   . Neck arthritis, Severe, multi-level 06/09/2013  . Paroxysmal atrial fibrillation ([name])    s/p failed ablation  . Peptic ulcer, unspecified site, unspecified as acute or chronic, without mention of hemorrhage, perforation, or obstruction 1975  . Personal history of malignant neoplasm of prostate 12/2000   5 weeks radiation, radiation seeds  . Radiation proctitis   . Sinoatrial node dysfunction ([name])   . Unspecified glaucoma(365.9)   . Unspecified hypothyroidism   . Vitamin B12 deficiency     Medications:  Medications Prior to Admission  Medication Sig Dispense Refill Last Dose  . acetaminophen (TYLENOL) 500 MG tablet Take 500 mg by mouth every 6 (six) hours as needed for moderate pain or headache.   prn at prn  . alendronate (FOSAMAX) 70 MG tablet TAKE 1 TABLET BY MOUTH EVERY 7 DAYS WITH A FULL GLASS OF WATER ON AN EMPTY STOMACH (Patient taking differently: Take 70 mg by mouth once a week. ) 12 tablet 3 Past Week at Unknown time  . amiodarone (PACERONE) 200 MG tablet TAKE 1/2 TABLET BY MOUTH DAILY (Patient taking differently: Take 100 mg by mouth at bedtime. ) 45 tablet 2 02/13/2019 at Unknown time  . Cyanocobalamin (VITAMIN B-12 IJ) Inject 1,000 mcg as directed every 30 (thirty) days.    Past Month at Unknown time  .  docusate sodium (COLACE) 50 MG capsule Take 50 mg by mouth 2 (two) times daily as needed for mild constipation.   prn at prn  . dorzolamide-timolol (COSOPT) 22.3-6.8 MG/ML ophthalmic solution Place 1 drop into both eyes 2 (two) times daily.     02/14/2019 at Unknown time  . ferrous sulfate 325 (65 FE) MG EC tablet Take 325 mg by mouth daily with breakfast.     02/14/2019 at Unknown time  . furosemide (LASIX) 20 MG tablet Take 2 tablets (40 mg) by mouth once every other day as directed 180 tablet 1 02/14/2019 at Unknown time  . levothyroxine (SYNTHROID) 100 MCG tablet TAKE 1 TABLET BY MOUTH DAILY BEFORE BREAKFAST (Patient taking  differently: Take 100 mcg by mouth daily before breakfast. TAKE 1 TABLET BY MOUTH DAILY BEFORE BREAKFAST) 90 tablet 3 02/14/2019 at Unknown time  . loperamide (IMODIUM) 2 MG capsule Take 2 mg by mouth as needed for diarrhea or loose stools.   prn at prn  . Multiple Vitamin (MULTIVITAMIN) tablet Take 1 tablet by mouth daily.     02/14/2019 at Unknown time  . pantoprazole (PROTONIX) 40 MG tablet TAKE 1 TABLET(40 MG) BY MOUTH DAILY 90 tablet 1 02/14/2019 at Unknown time  . polyethylene glycol (MIRALAX / GLYCOLAX) packet Take 17 g by mouth daily as needed for moderate constipation.    prn at prn  . pravastatin (PRAVACHOL) 40 MG tablet TAKE ONE-HALF TABLET BY MOUTH DAILY (Patient taking differently: Take 20 mg by mouth at bedtime. ) 45 tablet 3 02/13/2019 at Unknown time  . simethicone (MYLICON) 756 MG chewable tablet Chew 125 mg by mouth every 6 (six) hours as needed for flatulence.   prn at prn  . tamsulosin (FLOMAX) 0.4 MG CAPS capsule TAKE 1 CAPSULE(0.4 MG) BY MOUTH DAILY 90 capsule 0 02/14/2019 at Unknown time  . warfarin (COUMADIN) 1 MG tablet TAKE 1 AND 1/2 TABLETS BY MOUTH DAILY AS DIRECTED 45 tablet 1 02/14/2019 at Unknown time  . Wheat Dextrin (BENEFIBER DRINK MIX PO) Take 1 scoop by mouth daily.    Past Week at Unknown time   Scheduled:  . amiodarone  100 mg Oral QHS  . Chlorhexidine Gluconate Cloth  6 each Topical Daily  . docusate sodium  100 mg Oral BID  . dorzolamide-timolol  1 drop Both Eyes BID  . ferrous sulfate  325 mg Oral Q breakfast  . furosemide  40 mg Oral QODAY  . levothyroxine  100 mcg Oral Q0600  . multivitamin with minerals  1 tablet Oral Daily  . pantoprazole  40 mg Oral Daily  . pantoprazole  40 mg Oral Daily  . pravastatin  20 mg Oral QHS  . tamsulosin  0.4 mg Oral QPC supper   Infusions:  . methocarbamol (ROBAXIN) IV     PRN: alum & mag hydroxide-simeth, bisacodyl, diphenhydrAMINE, docusate sodium, magnesium citrate, magnesium hydroxide, menthol-cetylpyridinium  **OR** phenol, methocarbamol **OR** methocarbamol (ROBAXIN) IV, metoCLOPramide **OR** metoCLOPramide (REGLAN) injection, morphine injection, ondansetron **OR** ondansetron (ZOFRAN) IV, oxyCODONE, polyethylene glycol, zolpidem Anti-infectives (From admission, onward)   Start     Dose/Rate Route Frequency Ordered Stop   02/16/19 2000  ceFAZolin (ANCEF) IVPB 1 g/50 mL premix     1 g 100 mL/hr over 30 Minutes Intravenous Every 6 hours 02/16/19 1638 02/17/19 0335   02/16/19 1400  ceFAZolin (ANCEF) IVPB 1 g/50 mL premix     1 g 100 mL/hr over 30 Minutes Intravenous  Once 02/16/19 0738 02/16/19 1400   02/16/19 1340  ceFAZolin (ANCEF) 1-4 GM/50ML-% IVPB    Note to Pharmacy: Sylvester Harder   : cabinet override      02/16/19 1340 02/16/19 1400      Assessment: Pharmacy has been consulted to restart Warfarin on 85yo patient with paroxysmal Atrial Fibrillation post right anterior hip hemiarthroplasty. Patient's home dose regimen is Warfarin 1mg  MWF and 1.5mg  TuThSaSu. Patient was admitted with INR of 4.4 and last Warfarin dose taken was 11/17. Also takes Amiodarone 100mg  daily, which can cause increased INR, so will monitor closely. Warfarin was held due to surgeon wanting INR less than 1.6 prior to procedure. Will resume therapy tonight.  11/20: INR 1.3  Goal of Therapy:  INR 2-3 Monitor platelets by anticoagulation protocol: Yes   Plan:  Will order Warfarin 1.5mg  PO x 1 to given this evening as this is 1.5x the patient's home dose.  Will recheck INR with AM labs tomorrow morning.  Pearla Dubonnet, PharmD Clinical Pharmacist 02/17/2019 7:44 AM

## 2019-02-17 NOTE — Progress Notes (Signed)
Subjective: 1 Day Post-Op Procedure(s) (LRB): RIGHT ANTERIOR HIP HEMIARTHROPLASTY,CEMENTED (Right) Patient reports pain as mild.   Patient seen in rounds with Dr. Rudene Christians. Patient is well, and has had no acute complaints or problems Plan is to go home versus Rehab after hospital stay. Negative for chest pain and shortness of breath Fever: no Gastrointestinal: Negative for nausea and vomiting  Objective: Vital signs in last 24 hours: Temp:  [97.4 F (36.3 C)-98.6 F (37 C)] 97.4 F (36.3 C) (11/20 0420) Pulse Rate:  [59-69] 59 (11/20 0420) Resp:  [13-18] 17 (11/20 0420) BP: (93-128)/(56-69) 106/58 (11/20 0420) SpO2:  [97 %-100 %] 99 % (11/20 0420)  Intake/Output from previous day:  Intake/Output Summary (Last 24 hours) at 02/17/2019 0657 Last data filed at 02/17/2019 0500 Gross per 24 hour  Intake 1320 ml  Output 1325 ml  Net -5 ml    Intake/Output this shift: Total I/O In: 520 [P.O.:420; IV Piggyback:100] Out: 550 [Urine:550]  Labs: Recent Labs    02/14/19 1135 02/15/19 0420 02/16/19 0524 02/17/19 0350  HGB 10.9* 9.3* 9.1* 8.8*   Recent Labs    02/16/19 0524 02/17/19 0350  WBC 6.9 9.7  RBC 2.82* 2.75*  HCT 27.8* 26.9*  PLT 141* 144*   Recent Labs    02/16/19 0524 02/17/19 0350  NA 135 134*  K 4.7 5.4*  CL 108 110  CO2 19* 18*  BUN 39* 36*  CREATININE 2.23* 2.14*  GLUCOSE 102* 232*  CALCIUM 8.4* 8.1*   Recent Labs    02/16/19 1145 02/17/19 0350  INR 1.5* 1.3*     EXAM General - Patient is Alert and Oriented Extremity - Neurovascular intact Sensation intact distally Dorsiflexion/Plantar flexion intact Compartment soft Dressing/Incision - clean, dry, with the wound VAC in place Motor Function - intact, moving foot and toes well on exam.   Past Medical History:  Diagnosis Date  . AICD (automatic cardioverter/defibrillator) present    PACEMAKER  . Anemia   . Cancer ([place])   . Cardiac pacemaker in situ 07/2005   symptomatic bradycardia   . CKD (chronic kidney disease) stage 3, GFR 30-59 ml/min 04/13/2017  . Diverticulosis of colon (without mention of hemorrhage)   . Dysrhythmia   . 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 GERD (gastroesophageal reflux disease)   . GI hemorrhage   . Hyperlipidemia   . Hypertension   . Lower extremity edema   . Neck arthritis, Severe, multi-level 06/09/2013  . Paroxysmal atrial fibrillation ([name])    s/p failed ablation  . Peptic ulcer, unspecified site, unspecified as acute or chronic, without mention of hemorrhage, perforation, or obstruction 1975  . Personal history of malignant neoplasm of prostate 12/2000   5 weeks radiation, radiation seeds  . Radiation proctitis   . Sinoatrial node dysfunction ([name])   . Unspecified glaucoma(365.9)   . Unspecified hypothyroidism   . Vitamin B12 deficiency     Assessment/Plan: 1 Day Post-Op Procedure(s) (LRB): RIGHT ANTERIOR HIP HEMIARTHROPLASTY,CEMENTED (Right) Active Problems:   AF (paroxysmal atrial fibrillation) ([name])   Chronic renal failure, stage 3b   Closed right hip fracture, initial encounter ([place])  Estimated body mass index is 24.97 kg/m as calculated from the following:   Height as of this encounter: 5\' 11"  (1.803 m).   Weight as of this encounter: 81.2 kg. Advance diet Up with therapy D/C IV fluids  Discharge planning to rehab versus home.  Discharge per  medicine  DVT Prophylaxis - Coumadin, Foot Pumps and TED hose Weight-Bearing as tolerated to right leg  Reche Dixon, PA-C Orthopaedic Surgery 02/17/2019, 6:57 AM

## 2019-02-17 NOTE — Progress Notes (Signed)
OT Cancellation Note  Patient Details Name: [name] [name] MRN: [id number] DOB: April 04, 1922   Cancelled Treatment:    Reason Eval/Treat Not Completed: Medical issues which prohibited therapy. Consult received, chart reviewed. Pt noted with K+ of 5.3 falling outside guidelines for participating in therapy. Will hold OT evaluation and continue to follow acutely to re-attempt OT evaluation at later date/time as pt is medically appropriate.   Jeni Salles, MPH, MS, OTR/L ascom [phone] 02/17/19, 7:53 AM

## 2019-02-17 NOTE — Progress Notes (Signed)
Physical Therapy Treatment Patient Details Name: [name] [name] MRN: [id number] DOB: 07-Sep-1922 Today's Date: 02/17/2019    History of Present Illness 85 y/o male s/p fall with R hip fracture, s/p anterior approach hemiarthroplasty 11/19.    PT Comments    Pt showed great effort and willingness to participate with PT.  Eager to walk and do exercises with reports only only very minimal pain t/o the session.  He remains unable to do SLRs (as expected) but actually did well with lightly resisted exercises even on the R.  Pt still needed some assist with bed mobility but was clearly more comfortable with transitions this afternoon.  However, he remains very unsteady and unsafe with ambulation showing nearly ataxic LE control during swing and step (often at/across mid line, progressively worsening posture w/o constant cuing and very poor walker manipulation during turns quickly getting outside walker support and needing direct assist to manipulate walker).  Again good effort and progress with exercises and mobility but pt very unsteady and unsafe with ambulation even with constant cuing.   Follow Up Recommendations  SNF     Equipment Recommendations  Rolling walker with 5" wheels    Recommendations for Other Services       Precautions / Restrictions Precautions Precautions: Anterior Hip;Fall Restrictions Weight Bearing Restrictions: Yes RLE Weight Bearing: Weight bearing as tolerated    Mobility  Bed Mobility Overal bed mobility: Needs Assistance Bed Mobility: Sit to Supine     Supine to sit: Mod assist Sit to supine: Min assist   General bed mobility comments: Pt needed light assist to get R LE up into bed, but ultimately did quite well  Transfers Overall transfer level: Needs assistance Equipment used: Rolling walker (2 wheeled) Transfers: Sit to/from Stand Sit to Stand: Min assist         General transfer comment: Pt needed less assist to get up from recliner this  session, however still needed considerable cuing and reinforcement for hand placement, sequencing, etc   Ambulation/Gait Ambulation/Gait assistance: Min assist Gait Distance (Feet): 65 Feet Assistive device: Rolling walker (2 wheeled)       General Gait Details: Pt continues to have occasional scissoring despite cuing and inevitably with each turn (and despite cuing) he would get outside of his walker and show poor stability and awareness needing direct assist to reposition walker and reinforce appropriate positioning and use.  Pt with poor awareness and follow through with appropriate ambulation techniques and walker usage.   Stairs             Wheelchair Mobility    Modified Rankin (Stroke Patients Only)       Balance Overall balance assessment: Needs assistance Sitting-balance support: Bilateral upper extremity supported Sitting balance-Leahy Scale: Poor Sitting balance - Comments: Pt initially unable to even maintain sitting EOB, once PT assisted to position and scoot to appropriate position he was able to maintain with b/l UE support   Standing balance support: Bilateral upper extremity supported Standing balance-Leahy Scale: Fair Standing balance comment: Pt with no LOBs, but highly reliant on the walker and showed consistent unsteadiness.                            Cognition Arousal/Alertness: Awake/alert Behavior During Therapy: WFL for tasks assessed/performed Overall Cognitive Status: Within Functional Limits for tasks assessed  General Comments: mild difficulty with higher level sequencing during AE instruction for LB dressing, requiring more verbal cues for sequencing      Exercises Total Joint Exercises Ankle Circles/Pumps: 10 reps;Strengthening Quad Sets: Strengthening;10 reps Short Arc Quad: Strengthening;10 reps Heel Slides: Strengthening;10 reps Hip ABduction/ADduction: AROM;10 reps Straight  Leg Raises: (unable) Other Exercises Other Exercises: instructed in AE/DME for LB ADL with pt able to return demo use of reacher for donning pants with Mod A and Max verbal cues for sequencing    General Comments        Pertinent Vitals/Pain Pain Assessment: 0-10 Pain Score: 0-No pain(reports he has had very brief bursts of pain @ incision site) Pain Location: R hip Pain Descriptors / Indicators: Aching Pain Intervention(s): Limited activity within patient's tolerance;Monitored during session;Repositioned    Home Living Family/patient expects to be discharged to:: Skilled nursing facility Living Arrangements: Children(daughter has been staying with him and can continue)                  Prior Function Level of Independence: Independent      Comments: apparently he "should have been using AD" but was able to do some limited driving and take care of ADLs, etc w/o assist   PT Goals (current goals can now be found in the care plan section) Acute Rehab PT Goals Patient Stated Goal: get stronger at rehab and go home PT Goal Formulation: With patient Time For Goal Achievement: 03/03/19 Potential to Achieve Goals: Fair Progress towards PT goals: Progressing toward goals    Frequency    BID      PT Plan Current plan remains appropriate    Co-evaluation              AM-PAC PT "6 Clicks" Mobility   Outcome Measure  Help needed turning from your back to your side while in a flat bed without using bedrails?: A Little Help needed moving from lying on your back to sitting on the side of a flat bed without using bedrails?: A Lot Help needed moving to and from a bed to a chair (including a wheelchair)?: A Little Help needed standing up from a chair using your arms (e.g., wheelchair or bedside chair)?: A Little Help needed to walk in hospital room?: A Lot Help needed climbing 3-5 steps with a railing? : A Lot 6 Click Score: 15    End of Session Equipment Utilized  During Treatment: Gait belt Activity Tolerance: Patient limited by fatigue;Patient limited by pain Patient left: with call bell/phone within reach;with bed alarm set Nurse Communication: Mobility status PT Visit Diagnosis: Muscle weakness (generalized) (M62.81);Difficulty in walking, not elsewhere classified (R26.2);Pain Pain - Right/Left: Right Pain - part of body: Hip     Time: 6301-6010 PT Time Calculation (min) (ACUTE ONLY): 25 min  Charges:  $Gait Training: 8-22 mins $Therapeutic Exercise: 8-22 mins                     Kreg Shropshire, DPT 02/17/2019, 5:44 PM

## 2019-02-17 NOTE — Evaluation (Signed)
Physical Therapy Evaluation Patient Details Name: [name] [name] MRN: [id number] DOB: 1923-01-30 Today's Date: 02/17/2019   History of Present Illness  85 y/o male s/p fall with R hip fracture, s/p anterior approach hemiarthroplasty 11/19.  Clinical Impression  Pt eager to work with PT but was limited with bed mobility and showed some definite unsteadiness during ambulation.  He was able to go 35-40 ft but showed consistent unsteadiness, heavy reliance on the walker and overall displayed general safety concerns with upright acts.  Pt could not do SLRs and struggled with against gravity acts but showed good effort with exercises and was eager to do all he could.  Pt is not safe to return home even if he had 24/7 assist, will require STR to work back to a more appropriate and safe status at home.     Follow Up Recommendations SNF    Equipment Recommendations  Rolling walker with 5" wheels    Recommendations for Other Services       Precautions / Restrictions Precautions Precautions: Anterior Hip;Fall Restrictions RLE Weight Bearing: Weight bearing as tolerated      Mobility  Bed Mobility Overal bed mobility: Needs Assistance Bed Mobility: Supine to Sit     Supine to sit: Mod assist     General bed mobility comments: Pt needed considerable assist to get to EOB, he initiated rolling and looked as though he'd do better but ultimately needed relatively heavy assist to get torso upright and then very much struggled to keep himself balanced/upright at EOB  Transfers Overall transfer level: Needs assistance Equipment used: Rolling walker (2 wheeled) Transfers: Sit to/from Stand Sit to Stand: Min assist;Mod assist         General transfer comment: Pt needed verbal cues as well as close tactile cuing to initiate getting to standing, did need light assist to attain upright in walker  Ambulation/Gait Ambulation/Gait assistance: Min assist Gait Distance (Feet): 35  Feet Assistive device: Rolling walker (2 wheeled)       General Gait Details: Pt with narrow BOS, showed poor quality of motion during b/l step phase with inconsistent foot placement and consistent leaning to the R.    Stairs            Wheelchair Mobility    Modified Rankin (Stroke Patients Only)       Balance Overall balance assessment: Needs assistance Sitting-balance support: Bilateral upper extremity supported Sitting balance-Leahy Scale: Poor Sitting balance - Comments: Pt initially unable to even maintain sitting EOB, once PT assisted to position and scoot to appropriate position he was able to maintain with b/l UE support   Standing balance support: Bilateral upper extremity supported Standing balance-Leahy Scale: Fair Standing balance comment: Pt with no LOBs, but highly reliant on the walker and showed consistent unsteadiness.                             Pertinent Vitals/Pain Pain Assessment: 0-10 Pain Score: 3 (no pain on arrival, increased with some activity)    Home Living Family/patient expects to be discharged to:: Skilled nursing facility Living Arrangements: Children(daughter has been staying with him and can continue)                    Prior Function Level of Independence: Independent         Comments: apparently he "should have been using AD" but was able to do some limited driving and take care  of ADLs, etc w/o assist     Hand Dominance        Extremity/Trunk Assessment   Upper Extremity Assessment Upper Extremity Assessment: Overall WFL for tasks assessed;Defer to OT evaluation(age appropriate limitations in functional range)    Lower Extremity Assessment Lower Extremity Assessment: (expected R LE post-op weakness, otherwise functional t/o)       Communication   Communication: No difficulties  Cognition Arousal/Alertness: Awake/alert Behavior During Therapy: WFL for tasks assessed/performed Overall Cognitive  Status: Within Functional Limits for tasks assessed                                        General Comments      Exercises Total Joint Exercises Ankle Circles/Pumps: AROM;10 reps Quad Sets: Strengthening;10 reps Short Arc Quad: 10 reps;AROM Heel Slides: AAROM;10 reps(resisted leg extensions) Hip ABduction/ADduction: AROM;10 reps Straight Leg Raises: PROM;5 reps   Assessment/Plan    PT Assessment Patient needs continued PT services  PT Problem List Decreased strength;Decreased range of motion;Decreased activity tolerance;Decreased balance;Decreased mobility;Decreased coordination;Decreased knowledge of use of DME;Decreased safety awareness;Pain       PT Treatment Interventions DME instruction;Gait training;Stair training;Functional mobility training;Therapeutic activities;Balance training;Therapeutic exercise;Neuromuscular re-education;Patient/family education    PT Goals (Current goals can be found in the Care Plan section)  Acute Rehab PT Goals Patient Stated Goal: get stronger at rehab and go home PT Goal Formulation: With patient Time For Goal Achievement: 03/03/19 Potential to Achieve Goals: Fair    Frequency BID   Barriers to discharge        Co-evaluation               AM-PAC PT "6 Clicks" Mobility  Outcome Measure   Help needed moving from lying on your back to sitting on the side of a flat bed without using bedrails?: A Lot Help needed moving to and from a bed to a chair (including a wheelchair)?: A Little Help needed standing up from a chair using your arms (e.g., wheelchair or bedside chair)?: A Little Help needed to walk in hospital room?: A Lot Help needed climbing 3-5 steps with a railing? : A Lot 6 Click Score: 12    End of Session Equipment Utilized During Treatment: Gait belt Activity Tolerance: Patient limited by fatigue;Patient limited by pain Patient left: with call bell/phone within reach;with chair alarm set Nurse  Communication: Mobility status PT Visit Diagnosis: Muscle weakness (generalized) (M62.81);Difficulty in walking, not elsewhere classified (R26.2);Pain Pain - Right/Left: Right Pain - part of body: Hip    Time: 1010-1050 PT Time Calculation (min) (ACUTE ONLY): 40 min   Charges:   PT Evaluation $PT Eval Low Complexity: 1 Low PT Treatments $Gait Training: 8-22 mins $Therapeutic Exercise: 8-22 mins        Kreg Shropshire, DPT 02/17/2019, 2:17 PM

## 2019-02-17 NOTE — TOC Progression Note (Signed)
Transition of Care [facility]) - Progression Note    Patient Details  Name: ACELIN FERDIG MRN: [id number] Date of Birth: 11/02/1922  [facility] CM/SW Contact  Shelbie Hutching, RN Phone Number: 02/17/2019, 2:09 PM  Clinical Narrative:    Patient worked with PT today and PT recommends SNF. Patient chooses [facility], Radiation protection practitioner has offered a bed.  [facility] does not accept patient's over the weekend so likely discharge on Monday.    Expected Discharge Plan: [place] Barriers to Discharge: Continued Medical Work up  Expected Discharge Plan and Services Expected Discharge Plan: [place]   Discharge Planning Services: CM Consult Post Acute Care Choice: [place] Living arrangements for the past 2 months: Single Family Home                                       Social Determinants of Health (SDOH) Interventions    Readmission Risk Interventions No flowsheet data found.

## 2019-02-17 NOTE — Anesthesia Postprocedure Evaluation (Signed)
Anesthesia Post Note  Patient: [name] [name]  Procedure(s) Performed: RIGHT ANTERIOR HIP HEMIARTHROPLASTY,CEMENTED (Right Hip)  Patient location during evaluation: PACU Anesthesia Type: General Level of consciousness: awake and alert Pain management: pain level controlled Vital Signs Assessment: post-procedure vital signs reviewed and stable Respiratory status: spontaneous breathing, nonlabored ventilation, respiratory function stable and patient connected to nasal cannula oxygen Cardiovascular status: blood pressure returned to baseline and stable Postop Assessment: no apparent nausea or vomiting Anesthetic complications: no     Last Vitals:  Vitals:   02/17/19 0420 02/17/19 0728  BP: (!) 106/58 107/66  Pulse: (!) 59 85  Resp: 17 16  Temp: (!) 36.3 C 36.7 C  SpO2: 99% 98%    Last Pain:  Vitals:   02/17/19 0728  TempSrc: Oral  PainSc: 0-No pain                 Molli Barrows

## 2019-02-17 NOTE — Care Management Important Message (Signed)
Important Message  Patient Details  Name: [name] [name] MRN: [id number] Date of Birth: [birth year]   Medicare Important Message Given:  Yes     Juliann Pulse A Davidmichael Zarazua 02/17/2019, 11:22 AM

## 2019-02-18 LAB — BASIC METABOLIC PANEL
Anion gap: 7 (ref 5–15)
BUN: 46 mg/dL — ABNORMAL HIGH (ref 8–23)
CO2: 20 mmol/L — ABNORMAL LOW (ref 22–32)
Calcium: 8.3 mg/dL — ABNORMAL LOW (ref 8.9–10.3)
Chloride: 108 mmol/L (ref 98–111)
Creatinine, Ser: 2.34 mg/dL — ABNORMAL HIGH (ref 0.61–1.24)
GFR calc Af Amer: 27 mL/min — ABNORMAL LOW (ref >60)
GFR calc non Af Amer: 23 mL/min — ABNORMAL LOW (ref >60)
Glucose, Bld: 100 mg/dL — ABNORMAL HIGH (ref 70–99)
Potassium: 5.1 mmol/L (ref 3.5–5.1)
Sodium: 135 mmol/L (ref 135–145)

## 2019-02-18 LAB — PROTIME-INR
INR: 1.6 — ABNORMAL HIGH (ref 0.8–1.2)
Prothrombin Time: 19.3 seconds — ABNORMAL HIGH (ref 11.4–15.2)

## 2019-02-18 LAB — CBC
HCT: 26 % — ABNORMAL LOW (ref 39.0–52.0)
Hemoglobin: 8.1 g/dL — ABNORMAL LOW (ref 13.0–17.0)
MCH: 32 pg (ref 26.0–34.0)
MCHC: 31.2 g/dL (ref 30.0–36.0)
MCV: 102.8 fL — ABNORMAL HIGH (ref 80.0–100.0)
Platelets: 159 10*3/uL (ref 150–400)
RBC: 2.53 MIL/uL — ABNORMAL LOW (ref 4.22–5.81)
RDW: 12.9 % (ref 11.5–15.5)
WBC: 9.6 10*3/uL (ref 4.0–10.5)
nRBC: 0 % (ref 0.0–0.2)

## 2019-02-18 LAB — GLUCOSE, CAPILLARY: Glucose-Capillary: 90 mg/dL (ref 70–99)

## 2019-02-18 MED ORDER — WARFARIN SODIUM 2 MG PO TABS
2.0000 mg | ORAL_TABLET | Freq: Once | ORAL | Status: AC
  Administered 2019-02-18: 2 mg via ORAL
  Filled 2019-02-18: qty 1

## 2019-02-18 MED ORDER — OXYCODONE HCL 5 MG PO TABS: 5.0000 mg | ORAL_TABLET | ORAL | 0 refills | Status: DC | PRN

## 2019-02-18 NOTE — Progress Notes (Signed)
Subjective: 2 Days Post-Op Procedure(s) (LRB): RIGHT ANTERIOR HIP HEMIARTHROPLASTY,CEMENTED (Right) Patient reports pain as mild.   Patient seen in rounds with Dr. Marry Guan. Patient is well, and has had no acute complaints or problems Plan is to go home versus Rehab after hospital stay. Negative for chest pain and shortness of breath Fever: no Gastrointestinal: Negative for nausea and vomiting  Objective: Vital signs in last 24 hours: Temp:  [97.8 F (36.6 C)-98.6 F (37 C)] 98.1 F (36.7 C) (11/20 2323) Pulse Rate:  [59-85] 63 (11/20 2323) Resp:  [15-17] 16 (11/20 2323) BP: (83-107)/(50-66) 90/55 (11/20 2323) SpO2:  [96 %-98 %] 96 % (11/20 2323)  Intake/Output from previous day:  Intake/Output Summary (Last 24 hours) at 02/18/2019 0608 Last data filed at 02/17/2019 2300 Gross per 24 hour  Intake 480 ml  Output 600 ml  Net -120 ml    Intake/Output this shift: Total I/O In: -  Out: 350 [Urine:350]  Labs: Recent Labs    02/16/19 0524 02/17/19 0350  HGB 9.1* 8.8*   Recent Labs    02/16/19 0524 02/17/19 0350  WBC 6.9 9.7  RBC 2.82* 2.75*  HCT 27.8* 26.9*  PLT 141* 144*   Recent Labs    02/16/19 0524 02/17/19 0350  NA 135 134*  K 4.7 5.4*  CL 108 110  CO2 19* 18*  BUN 39* 36*  CREATININE 2.23* 2.14*  GLUCOSE 102* 232*  CALCIUM 8.4* 8.1*   Recent Labs    02/16/19 1145 02/17/19 0350  INR 1.5* 1.3*     EXAM General - Patient is Alert and Oriented Extremity - Neurovascular intact Sensation intact distally Dorsiflexion/Plantar flexion intact Compartment soft Dressing/Incision - clean, dry, with the wound VAC in place Motor Function - intact, moving foot and toes well on exam.  Ambulated 65 feet with physical therapy  Past Medical History:  Diagnosis Date  . AICD (automatic cardioverter/defibrillator) present    PACEMAKER  . Anemia   . Cancer ([place])   . Cardiac pacemaker in situ 07/2005   symptomatic bradycardia  . CKD (chronic kidney  disease) stage 3, GFR 30-59 ml/min 04/13/2017  . Diverticulosis of colon (without mention of hemorrhage)   . Dysrhythmia   . 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 GERD (gastroesophageal reflux disease)   . GI hemorrhage   . Hyperlipidemia   . Hypertension   . Lower extremity edema   . Neck arthritis, Severe, multi-level 06/09/2013  . Paroxysmal atrial fibrillation ([name])    s/p failed ablation  . Peptic ulcer, unspecified site, unspecified as acute or chronic, without mention of hemorrhage, perforation, or obstruction 1975  . Personal history of malignant neoplasm of prostate 12/2000   5 weeks radiation, radiation seeds  . Radiation proctitis   . Sinoatrial node dysfunction ([name])   . Unspecified glaucoma(365.9)   . Unspecified hypothyroidism   . Vitamin B12 deficiency     Assessment/Plan: 2 Days Post-Op Procedure(s) (LRB): RIGHT ANTERIOR HIP HEMIARTHROPLASTY,CEMENTED (Right) Active Problems:   AF (paroxysmal atrial fibrillation) ([name])   Chronic renal failure, stage 3b   Closed right hip fracture, initial encounter ([name])   Closed fracture of neck of right femur ([name])   Longstanding persistent atrial fibrillation ([name])   Warfarin anticoagulation  Estimated body mass index is 24.97 kg/m as calculated from the following:   Height as of this encounter: 5\' 11"  (1.803 m).   Weight as of this encounter: 81.2  kg. Advance diet Up with therapy D/C IV fluids  Discharge planning to rehab.  Discharge per medicine  DVT Prophylaxis - Coumadin, Foot Pumps and TED hose Weight-Bearing as tolerated to right leg  Reche Dixon, PA-C Orthopaedic Surgery 02/18/2019, 6:08 AM

## 2019-02-18 NOTE — Consult Note (Signed)
[facility] for Warfarin Dosing Indication: atrial fibrillation  Allergies  Allergen Reactions  . Penicillins Rash and Other (See Comments)    Has patient had a PCN reaction causing immediate rash, facial/tongue/throat swelling, SOB or lightheadedness with hypotension: {no Has patient had a PCN reaction causing severe rash involving mucus membranes or skin necrosis: {no Has patient had a PCN reaction that required hospitalization {no Has patient had a PCN reaction occurring within the last 10 years: {no If all of the above answers are "NO", then may proceed with Cephalosporin use.    Patient Measurements: Height: 5\' 11"  (180.3 cm) Weight: 179 lb (81.2 kg) IBW/kg (Calculated) : 75.3 Heparin Dosing Weight: 60.8 kg  Vital Signs: BP: 106/62 (11/21 0900) Pulse Rate: 61 (11/21 0900)  Labs: Recent Labs    02/16/19 0524 02/16/19 1145 02/17/19 0350 02/18/19 0608 02/18/19 0841  HGB 9.1*  --  8.8*  --  8.1*  HCT 27.8*  --  26.9*  --  26.0*  PLT 141*  --  144*  --  159  LABPROT 19.7* 17.6* 15.9* 19.3*  --   INR 1.7* 1.5* 1.3* 1.6*  --   CREATININE 2.23*  --  2.14*  --  2.34*    Estimated Creatinine Clearance: 21 mL/min (A) (by C-G formula based on SCr of 2.34 mg/dL (H)).   Medical History: Past Medical History:  Diagnosis Date  . AICD (automatic cardioverter/defibrillator) present    PACEMAKER  . Anemia   . Cancer ([place])   . Cardiac pacemaker in situ 07/2005   symptomatic bradycardia  . CKD (chronic kidney disease) stage 3, GFR 30-59 ml/min 04/13/2017  . Diverticulosis of colon (without mention of hemorrhage)   . Dysrhythmia   . 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 GERD (gastroesophageal reflux disease)   . GI hemorrhage   . Hyperlipidemia   . Hypertension   . Lower extremity edema   . Neck arthritis, Severe, multi-level 06/09/2013  . Paroxysmal atrial  fibrillation ([name])    s/p failed ablation  . Peptic ulcer, unspecified site, unspecified as acute or chronic, without mention of hemorrhage, perforation, or obstruction 1975  . Personal history of malignant neoplasm of prostate 12/2000   5 weeks radiation, radiation seeds  . Radiation proctitis   . Sinoatrial node dysfunction ([name])   . Unspecified glaucoma(365.9)   . Unspecified hypothyroidism   . Vitamin B12 deficiency     Medications:  Medications Prior to Admission  Medication Sig Dispense Refill Last Dose  . acetaminophen (TYLENOL) 500 MG tablet Take 500 mg by mouth every 6 (six) hours as needed for moderate pain or headache.   prn at prn  . alendronate (FOSAMAX) 70 MG tablet TAKE 1 TABLET BY MOUTH EVERY 7 DAYS WITH A FULL GLASS OF WATER ON AN EMPTY STOMACH (Patient taking differently: Take 70 mg by mouth once a week. ) 12 tablet 3 Past Week at Unknown time  . amiodarone (PACERONE) 200 MG tablet TAKE 1/2 TABLET BY MOUTH DAILY (Patient taking differently: Take 100 mg by mouth at bedtime. ) 45 tablet 2 02/13/2019 at Unknown time  . Cyanocobalamin (VITAMIN B-12 IJ) Inject 1,000 mcg as directed every 30 (thirty) days.    Past Month at Unknown time  . docusate sodium (COLACE) 50 MG capsule Take 50 mg by mouth 2 (two) times daily as needed for mild constipation.   prn at prn  .  dorzolamide-timolol (COSOPT) 22.3-6.8 MG/ML ophthalmic solution Place 1 drop into both eyes 2 (two) times daily.     02/14/2019 at Unknown time  . ferrous sulfate 325 (65 FE) MG EC tablet Take 325 mg by mouth daily with breakfast.     02/14/2019 at Unknown time  . furosemide (LASIX) 20 MG tablet Take 2 tablets (40 mg) by mouth once every other day as directed 180 tablet 1 02/14/2019 at Unknown time  . levothyroxine (SYNTHROID) 100 MCG tablet TAKE 1 TABLET BY MOUTH DAILY BEFORE BREAKFAST (Patient taking differently: Take 100 mcg by mouth daily before breakfast. TAKE 1 TABLET BY MOUTH DAILY BEFORE BREAKFAST) 90 tablet 3  02/14/2019 at Unknown time  . loperamide (IMODIUM) 2 MG capsule Take 2 mg by mouth as needed for diarrhea or loose stools.   prn at prn  . Multiple Vitamin (MULTIVITAMIN) tablet Take 1 tablet by mouth daily.     02/14/2019 at Unknown time  . pantoprazole (PROTONIX) 40 MG tablet TAKE 1 TABLET(40 MG) BY MOUTH DAILY 90 tablet 1 02/14/2019 at Unknown time  . polyethylene glycol (MIRALAX / GLYCOLAX) packet Take 17 g by mouth daily as needed for moderate constipation.    prn at prn  . pravastatin (PRAVACHOL) 40 MG tablet TAKE ONE-HALF TABLET BY MOUTH DAILY (Patient taking differently: Take 20 mg by mouth at bedtime. ) 45 tablet 3 02/13/2019 at Unknown time  . simethicone (MYLICON) 119 MG chewable tablet Chew 125 mg by mouth every 6 (six) hours as needed for flatulence.   prn at prn  . tamsulosin (FLOMAX) 0.4 MG CAPS capsule TAKE 1 CAPSULE(0.4 MG) BY MOUTH DAILY 90 capsule 0 02/14/2019 at Unknown time  . warfarin (COUMADIN) 1 MG tablet TAKE 1 AND 1/2 TABLETS BY MOUTH DAILY AS DIRECTED 45 tablet 1 02/14/2019 at Unknown time  . [place] Dextrin (BENEFIBER DRINK MIX PO) Take 1 scoop by mouth daily.    Past Week at Unknown time   Scheduled:  . acetaminophen  1,000 mg Oral TID  . amiodarone  100 mg Oral QHS  . Chlorhexidine Gluconate Cloth  6 each Topical Daily  . docusate sodium  100 mg Oral BID  . dorzolamide-timolol  1 drop Both Eyes BID  . ferrous sulfate  325 mg Oral Q breakfast  . furosemide  40 mg Oral QODAY  . levothyroxine  100 mcg Oral Q0600  . multivitamin with minerals  1 tablet Oral Daily  . pantoprazole  40 mg Oral Daily  . pravastatin  20 mg Oral QHS  . tamsulosin  0.4 mg Oral QPC supper  . Warfarin - Pharmacist Dosing Inpatient   Does not apply q1800   Infusions:  . methocarbamol (ROBAXIN) IV     PRN: alum & mag hydroxide-simeth, bisacodyl, diphenhydrAMINE, docusate sodium, magnesium citrate, magnesium hydroxide, menthol-cetylpyridinium **OR** phenol, methocarbamol **OR** methocarbamol  (ROBAXIN) IV, metoCLOPramide **OR** metoCLOPramide (REGLAN) injection, morphine injection, ondansetron **OR** ondansetron (ZOFRAN) IV, oxyCODONE, polyethylene glycol, zolpidem Anti-infectives (From admission, onward)   Start     Dose/Rate Route Frequency Ordered Stop   02/16/19 2000  ceFAZolin (ANCEF) IVPB 1 g/50 mL premix     1 g 100 mL/hr over 30 Minutes Intravenous Every 6 hours 02/16/19 1638 02/17/19 0335   02/16/19 1400  ceFAZolin (ANCEF) IVPB 1 g/50 mL premix     1 g 100 mL/hr over 30 Minutes Intravenous  Once 02/16/19 0738 02/16/19 1400   02/16/19 1340  ceFAZolin (ANCEF) 1-4 GM/50ML-% IVPB    Note to Pharmacy: Sylvester Harder   :  cabinet override      02/16/19 1340 02/16/19 1400      Assessment: Pharmacy has been consulted to restart Warfarin on 85yo patient with paroxysmal Atrial Fibrillation post right anterior hip hemiarthroplasty. Patient's home dose regimen is Warfarin 1mg  MWF and 1.5mg  TuThSaSu. Patient was admitted with INR of 4.4 and last Warfarin dose taken was 11/17. Also takes Amiodarone 100mg  daily, which can cause increased INR, so will monitor closely. Warfarin was held due to surgeon wanting INR less than 1.6 prior to procedure. Will resume therapy tonight.  Date      INR     Dose 11/20     1.3      1.5 mg 11/21     1.6   Goal of Therapy:  INR 2-3 Monitor platelets by anticoagulation protocol: Yes   Plan:  Will order Warfarin 2mg  PO x 1 to be given this evening.    Will recheck INR with AM labs tomorrow morning.  Olivia Canter [facility] Clinical Pharmacist 02/18/2019 11:35 AM

## 2019-02-18 NOTE — Progress Notes (Signed)
PROGRESS NOTE    [name] [name]  TDV:[id number] DOB: 02/09/1923 DOA: 02/14/2019 PCP: Owens Loffler, MD      Brief Narrative:  [name] [name] is a 85 y.o. M with hx pAF on amiodarone and warfarin, bradycardia s/p PPM, CKD IV baseline 1.9-2.2, prostate Ca remote, who presented with fall and right hip pain.  In the ER, radiographs showed evidence of right hip fracture.  Orthopedics were consulted and recommended operative repair.        Assessment & Plan:  Acute right femoral neck fracture S/p right anterior hip hemiarthroplasty 11/19 by Dr. Rudene Christians Mechanical fall, resulted in right femoral neck fracture.  Surgery delayed by elevated and difficult to reverse INR, but he underwent arthroplasty/repair on 06/26, uncomplicated.  Post-op pain has been well controlled.    Creatinine stable relative to baseline, hemoglobin expected drop.  PT recommends SNF.  -Consult orthopedics, appreciate recommendations -PT eval -Continue oxycodone or morphine as needed for pain    Atrial fibrillation, paroxysmal Pacemaker in place Rate controlled.  CHA2DS2-Vasc 2 for age. -Continue warfarin -Continue amiodarone -Daily INR  Chronic kidney disease stage IV Creatinine stable relative to baseline -Continue furosemide -Continue pravastatin  Anemia of chronic kidney disease Hemoglobin expected postoperative drop -Continue oral iron  Hypothyroidism -Continue levothyroxine  BPH -Continue Flomax  GERD -Continue pantoprazole  Hyperkalemia Resolved spontaneously  At risk for malnutrition          MDM and disposition: The below labs and imaging reports reviewed and summarized above.  Medication management as above.  Anticoagulation discussed with pharmacy  The patient was admitted with acute right hip fracture.    His INR was corrected and he underwent hemiarthroplasty 94/85 uncomplicated.  We will now work with physical therapy for placement.  We will continue adjustment  of his warfarin for therapeutic INR and therapy is very arranged for safe discharge plan which is still pending.     DVT prophylaxis: Not applicable, on warfarin Code Status: DNR Family Communication:      Consultants:   Orthopedics  Procedures:   11/19 right anterior hemiarthroplasty  Antimicrobials:       Subjective: Still has some leg pain, no confusion, fever, cough, dyspnea, chest pain, palpitations, nausea vomiting.  Objective: Vitals:   02/17/19 1153 02/17/19 1535 02/17/19 2323 02/18/19 0900  BP: (!) 83/50 (!) 88/55 (!) 90/55 106/62  Pulse: (!) 59 61 63 61  Resp: 17 15 16 16   Temp: 97.8 F (36.6 C) 98.6 F (37 C) 98.1 F (36.7 C)   TempSrc: Oral Oral Oral   SpO2: 96% 98% 96% 98%  Weight:      Height:        Intake/Output Summary (Last 24 hours) at 02/18/2019 1309 Last data filed at 02/18/2019 0904 Gross per 24 hour  Intake 360 ml  Output 1350 ml  Net -990 ml   Filed Weights   02/15/19 0600  Weight: 81.2 kg    Examination: General appearance: Thin elderly adult male, alert and in no acute distress.  Lying in bed, interactive HEENT: Anicteric, conjunctiva pink, lids and lashes normal. No nasal deformity, discharge, epistaxis.  Lips moist, OP normal, no oral lesions.   Skin: Warm and dry.  No suspicious rashes or lesions. Cardiac: RRR, no murmurs appreciated.  JVP normal no LE edema.    Respiratory: Normal respiratory rate and rhythm.  CTAB without rales or wheezes. Abdomen: Abdomen soft.  No tenderness palpation or guarding. No ascites, distension, hepatosplenomegaly.   MSK: No deformities or effusions  of the large joints of the upper or lower extremities bilaterally.  Diffuse loss of subcutaneous muscle mass and fat Neuro: Awake and alert. Naming is grossly intact, and the patient's recall, recent and remote, as well as general fund of knowledge seem within normal limits.  Muscle tone decreased, without fasciculations.  Moves all extremities  equally with generalized weakness and with normal coordination.  Marland Kitchen Speech fluent.    Psych: Sensorium intact and responding to questions, attention normal. Affect normal.  Judgment and insight appear normal.          Data Reviewed: I have personally reviewed following labs and imaging studies:  CBC: Recent Labs  Lab 02/14/19 1135 02/15/19 0420 02/16/19 0524 02/17/19 0350 02/18/19 0841  WBC 6.5 6.3 6.9 9.7 9.6  HGB 10.9* 9.3* 9.1* 8.8* 8.1*  HCT 34.6* 29.3* 27.8* 26.9* 26.0*  MCV 101.2* 100.7* 98.6 97.8 102.8*  PLT 167 151 141* 144* 109   Basic Metabolic Panel: Recent Labs  Lab 02/14/19 1135 02/15/19 0420 02/16/19 0524 02/17/19 0350 02/18/19 0841  NA 137 137 135 134* 135  K 5.0 4.6 4.7 5.4* 5.1  CL 110 108 108 110 108  CO2 21* 20* 19* 18* 20*  GLUCOSE 104* 86 102* 232* 100*  BUN 37* 35* 39* 36* 46*  CREATININE 2.15* 2.08* 2.23* 2.14* 2.34*  CALCIUM 9.0 8.3* 8.4* 8.1* 8.3*   GFR: Estimated Creatinine Clearance: 21 mL/min (A) (by C-G formula based on SCr of 2.34 mg/dL (H)). Liver Function Tests: No results for input(s): AST, ALT, ALKPHOS, BILITOT, PROT, ALBUMIN in the last 168 hours. No results for input(s): LIPASE, AMYLASE in the last 168 hours. No results for input(s): AMMONIA in the last 168 hours. Coagulation Profile: Recent Labs  Lab 02/15/19 1539 02/16/19 0524 02/16/19 1145 02/17/19 0350 02/18/19 0608  INR 4.6* 1.7* 1.5* 1.3* 1.6*   Cardiac Enzymes: No results for input(s): CKTOTAL, CKMB, CKMBINDEX, TROPONINI in the last 168 hours. BNP (last 3 results) No results for input(s): PROBNP in the last 8760 hours. HbA1C: No results for input(s): HGBA1C in the last 72 hours. CBG: Recent Labs  Lab 02/18/19 0740  GLUCAP 90   Lipid Profile: No results for input(s): CHOL, HDL, LDLCALC, TRIG, CHOLHDL, LDLDIRECT in the last 72 hours. Thyroid Function Tests: No results for input(s): TSH, T4TOTAL, FREET4, T3FREE, THYROIDAB in the last 72 hours. Anemia  Panel: No results for input(s): VITAMINB12, FOLATE, FERRITIN, TIBC, IRON, RETICCTPCT in the last 72 hours. Urine analysis:    Component Value Date/Time   COLORURINE YELLOW (A) 02/14/2019 1141   APPEARANCEUR CLEAR (A) 02/14/2019 1141   APPEARANCEUR Cloudy (A) 02/10/2019 1341   LABSPEC 1.015 02/14/2019 1141   PHURINE 5.0 02/14/2019 1141   GLUCOSEU NEGATIVE 02/14/2019 1141   HGBUR MODERATE (A) 02/14/2019 1141   BILIRUBINUR NEGATIVE 02/14/2019 1141   BILIRUBINUR Negative 02/10/2019 1341   KETONESUR NEGATIVE 02/14/2019 1141   PROTEINUR 100 (A) 02/14/2019 1141   UROBILINOGEN 0.2 12/09/2016 1131   NITRITE NEGATIVE 02/14/2019 1141   LEUKOCYTESUR LARGE (A) 02/14/2019 1141   Sepsis Labs: @LABRCNTIP (procalcitonin:4,lacticacidven:4)  ) Recent Results (from the past 240 hour(s))  Microscopic Examination     Status: Abnormal   Collection Time: 02/10/19  1:41 PM   URINE  Result Value Ref Range Status   WBC, UA >30 (A) 0 - 5 /hpf Final   RBC 11-30 (A) 0 - 2 /hpf Final   Epithelial Cells (non renal) 0-10 0 - 10 /hpf Final   Crystals Present (A) N/A Final  Crystal Type Amorphous Sediment N/A Final   Bacteria, UA Many (A) None seen/Few Final  Urine culture     Status: Abnormal   Collection Time: 02/14/19 11:41 AM   Specimen: Urine, Random  Result Value Ref Range Status   Specimen Description   Final    URINE, RANDOM Performed at [facility], [address], [place], [place] [address]    Special Requests   Final    NONE Performed at [facility], [address], [place], [place] [address]    Culture (A)  Final    <10,000 COLONIES/mL INSIGNIFICANT GROWTH Performed at [facility] [address] [place], [place] [address]    Report Status 02/15/2019 FINAL  Final  SARS CORONAVIRUS 2 (TAT 6-24 HRS) Nasopharyngeal Nasopharyngeal Swab     Status: None   Collection Time: 02/14/19  3:36 PM   Specimen: Nasopharyngeal Swab  Result Value Ref Range Status   SARS  Coronavirus 2 NEGATIVE NEGATIVE Final    Comment: (NOTE) SARS-CoV-2 target nucleic acids are NOT DETECTED. The SARS-CoV-2 RNA is generally detectable in upper and lower respiratory specimens during the acute phase of infection. Negative results do not preclude SARS-CoV-2 infection, do not rule out co-infections with other pathogens, and should not be used as the sole basis for treatment or other patient management decisions. Negative results must be combined with clinical observations, patient history, and epidemiological information. The expected result is Negative. Fact Sheet for Patients: SugarRoll.be Fact Sheet for Healthcare Providers: https://www.woods-mathews.com/ This test is not yet approved or cleared by the [place] FDA and  has been authorized for detection and/or diagnosis of SARS-CoV-2 by FDA under an Emergency Use Authorization (EUA). This EUA will remain  in effect (meaning this test can be used) for the duration of the COVID-19 declaration under Section 56 4(b)(1) of the Act, 21 U.S.C. section 360bbb-3(b)(1), unless the authorization is terminated or revoked sooner. Performed at [facility], [place] [address] [place], [place] [address]          Radiology Studies: Dg Hip Operative Unilat W Or W/o Pelvis Right  Result Date: 02/16/2019 CLINICAL DATA:  Open reduction and internal fixation of proximal right femoral neck fracture. EXAM: OPERATIVE right HIP (WITH PELVIS IF PERFORMED) 2 VIEWS TECHNIQUE: Fluoroscopic spot image(s) were submitted for interpretation post-operatively. Radiation exposure index: 2.00 mGy. COMPARISON:  February 14, 2019. FINDINGS: Two intraoperative fluoroscopic images were obtained of the right hip. These demonstrate mildly displaced proximal right femoral neck fracture. IMPRESSION: Fluoroscopic guidance provided during open reduction and internal fixation of proximal right femoral neck  fracture. Electronically Signed   By: Marijo Conception M.D.   On: 02/16/2019 15:33   Dg Hip Unilat With Pelvis 2-3 Views Right  Result Date: 02/16/2019 CLINICAL DATA:  Post RIGHT total hip replacement EXAM: DG HIP (WITH OR WITHOUT PELVIS) 2-3V RIGHT COMPARISON:  Preoperative images of 02/14/2019 FINDINGS: Interval resection of the RIGHT femoral head neck and placement of a RIGHT hip prosthesis. No dislocation. No additional fractures identified. IMPRESSION: RIGHT hip prosthesis without acute complication. Electronically Signed   By: Lavonia Dana M.D.   On: 02/16/2019 17:25        Scheduled Meds:  acetaminophen  1,000 mg Oral TID   amiodarone  100 mg Oral QHS   Chlorhexidine Gluconate Cloth  6 each Topical Daily   docusate sodium  100 mg Oral BID   dorzolamide-timolol  1 drop Both Eyes BID   ferrous sulfate  325 mg Oral Q breakfast   furosemide  40 mg Oral QODAY   levothyroxine  100 mcg Oral Q0600   multivitamin with minerals  1 tablet Oral Daily   pantoprazole  40 mg Oral Daily   pravastatin  20 mg Oral QHS   tamsulosin  0.4 mg Oral QPC supper   warfarin  2 mg Oral ONCE-1800   Warfarin - Pharmacist Dosing Inpatient   Does not apply q1800   Continuous Infusions:  methocarbamol (ROBAXIN) IV       LOS: 4 days    Time spent: 25 minutes    Edwin Dada, MD [facility] 02/18/2019, 1:09 PM     Please page though [place] or Epic secure chat:  For [employer], Adult nurse

## 2019-02-18 NOTE — Progress Notes (Signed)
Physical Therapy Treatment Patient Details Name: [name] [name] MRN: [id number] DOB: 03-May-1922 Today's Date: 02/18/2019    History of Present Illness 85 y/o male s/p fall with R hip fracture, s/p anterior approach hemiarthroplasty 11/19.    PT Comments    Patient performs bed mobility with min assist supine <> sit , transfers with elevated bed and min assist with RW. He ambulates with RW 100 feet with min assist . He performs BLE exercises with AAROM to RLE.  He will continue to benefit from skilled PT to improve mobility and strength.   Follow Up Recommendations  SNF     Equipment Recommendations  Rolling walker with 5" wheels    Recommendations for Other Services       Precautions / Restrictions Precautions Precautions: Anterior Hip;Fall Restrictions Weight Bearing Restrictions: Yes RLE Weight Bearing: Weight bearing as tolerated    Mobility  Bed Mobility Overal bed mobility: Needs Assistance Bed Mobility: Sit to Supine       Sit to supine: Min assist   General bed mobility comments: VC for sequencing  Transfers Overall transfer level: Needs assistance Equipment used: Rolling walker (2 wheeled) Transfers: Sit to/from Stand Sit to Stand: Min assist         General transfer comment: better initial static standing balance  Ambulation/Gait Ambulation/Gait assistance: Min guard Gait Distance (Feet): 100 Feet Assistive device: Rolling walker (2 wheeled) Gait Pattern/deviations: Step-to pattern         Stairs             Wheelchair Mobility    Modified Rankin (Stroke Patients Only)       Balance                                            Cognition Arousal/Alertness: Awake/alert Behavior During Therapy: WFL for tasks assessed/performed                                          Exercises Total Joint Exercises Ankle Circles/Pumps: 10 reps;Strengthening Quad Sets: Strengthening;10 reps Short Arc  Quad: Strengthening;10 reps Heel Slides: Strengthening;10 reps Hip ABduction/ADduction: AROM;10 reps    General Comments        Pertinent Vitals/Pain Pain Assessment: No/denies pain Pain Score: 0-No pain    Home Living                      Prior Function            PT Goals (current goals can now be found in the care plan section) Acute Rehab PT Goals Patient Stated Goal: get stronger at rehab and go home Progress towards PT goals: Progressing toward goals    Frequency    BID      PT Plan Current plan remains appropriate    Co-evaluation              AM-PAC PT "6 Clicks" Mobility   Outcome Measure  Help needed turning from your back to your side while in a flat bed without using bedrails?: A Little Help needed moving from lying on your back to sitting on the side of a flat bed without using bedrails?: A Lot Help needed moving to and from a bed to a chair (including a wheelchair)?: A Little  Help needed standing up from a chair using your arms (e.g., wheelchair or bedside chair)?: A Little Help needed to walk in hospital room?: A Little Help needed climbing 3-5 steps with a railing? : A Lot 6 Click Score: 16    End of Session Equipment Utilized During Treatment: Gait belt Activity Tolerance: Patient limited by fatigue;Patient limited by pain Patient left: with call bell/phone within reach;with bed alarm set Nurse Communication: Mobility status PT Visit Diagnosis: Muscle weakness (generalized) (M62.81);Difficulty in walking, not elsewhere classified (R26.2);Pain Pain - Right/Left: Right Pain - part of body: Hip     Time: 1305-1330 PT Time Calculation (min) (ACUTE ONLY): 25 min  Charges:  $Gait Training: 8-22 mins $Therapeutic Activity: 8-22 mins                      Arelia Sneddon S, PT DPT 02/18/2019, 2:15 PM

## 2019-02-18 NOTE — Progress Notes (Signed)
Physical Therapy Treatment Patient Details Name: [name] [name] MRN: [id number] DOB: 22-Jan-1923 Today's Date: 02/18/2019    History of Present Illness 85 y/o male s/p fall with R hip fracture, s/p anterior approach hemiarthroplasty 11/19.    PT Comments    Patient agrees to PT treatment. He needs assist with BLE for supine <> sit bed mobility. He needs min guard for sit stand transfer with RW. He ambulates with RW 100 feet with IV pole and wound vac and min assist. Patient is able to performs  BLE therapeutic exercise in supine following ambulation. He is ambulating better with less gait deviations but continues to have unsteadiness, decreased gait speed and limited endurance. He will conitnue to benefit from skilled PT to improve mobility and strength.   Follow Up Recommendations  SNF     Equipment Recommendations  Rolling walker with 5" wheels    Recommendations for Other Services       Precautions / Restrictions Precautions Precautions: Anterior Hip;Fall Restrictions Weight Bearing Restrictions: Yes RLE Weight Bearing: Weight bearing as tolerated    Mobility  Bed Mobility Overal bed mobility: Needs Assistance Bed Mobility: Sit to Supine       Sit to supine: Min assist   General bed mobility comments: VC for sequencing  Transfers Overall transfer level: Needs assistance Equipment used: Rolling walker (2 wheeled) Transfers: Sit to/from Stand Sit to Stand: Min assist         General transfer comment: better initial static standing balance  Ambulation/Gait Ambulation/Gait assistance: Min guard Gait Distance (Feet): 100 Feet Assistive device: Rolling walker (2 wheeled) Gait Pattern/deviations: Step-to pattern         Stairs             Wheelchair Mobility    Modified Rankin (Stroke Patients Only)       Balance                                            Cognition Arousal/Alertness: Awake/alert Behavior During Therapy:  WFL for tasks assessed/performed Overall Cognitive Status: Within Functional Limits for tasks assessed                                        Exercises Total Joint Exercises Ankle Circles/Pumps: 10 reps;Strengthening Quad Sets: Strengthening;10 reps Short Arc Quad: Strengthening;10 reps Heel Slides: Strengthening;10 reps Hip ABduction/ADduction: AROM;10 reps    General Comments        Pertinent Vitals/Pain Pain Assessment: No/denies pain Pain Score: 0-No pain    Home Living                      Prior Function            PT Goals (current goals can now be found in the care plan section) Acute Rehab PT Goals Patient Stated Goal: get stronger at rehab and go home Progress towards PT goals: Progressing toward goals    Frequency    BID      PT Plan Current plan remains appropriate    Co-evaluation              AM-PAC PT "6 Clicks" Mobility   Outcome Measure  Help needed turning from your back to your side while in a flat bed without using  bedrails?: A Little Help needed moving from lying on your back to sitting on the side of a flat bed without using bedrails?: A Lot Help needed moving to and from a bed to a chair (including a wheelchair)?: A Little Help needed standing up from a chair using your arms (e.g., wheelchair or bedside chair)?: A Little Help needed to walk in hospital room?: A Little Help needed climbing 3-5 steps with a railing? : A Lot 6 Click Score: 16    End of Session Equipment Utilized During Treatment: Gait belt Activity Tolerance: Patient limited by fatigue;Patient limited by pain Patient left: with call bell/phone within reach;with bed alarm set Nurse Communication: Mobility status PT Visit Diagnosis: Muscle weakness (generalized) (M62.81);Difficulty in walking, not elsewhere classified (R26.2);Pain Pain - Right/Left: Right Pain - part of body: Hip     Time: 0945-1010 PT Time Calculation (min) (ACUTE  ONLY): 25 min  Charges:  $Gait Training: 8-22 mins $Therapeutic Activity: 8-22 mins                       Alanson Puls, PT DPT 02/18/2019, 10:57 AM

## 2019-02-19 LAB — CBC
HCT: 27 % — ABNORMAL LOW (ref 39.0–52.0)
Hemoglobin: 8.5 g/dL — ABNORMAL LOW (ref 13.0–17.0)
MCH: 32 pg (ref 26.0–34.0)
MCHC: 31.5 g/dL (ref 30.0–36.0)
MCV: 101.5 fL — ABNORMAL HIGH (ref 80.0–100.0)
Platelets: 191 10*3/uL (ref 150–400)
RBC: 2.66 MIL/uL — ABNORMAL LOW (ref 4.22–5.81)
RDW: 12.9 % (ref 11.5–15.5)
WBC: 8.2 10*3/uL (ref 4.0–10.5)
nRBC: 0 % (ref 0.0–0.2)

## 2019-02-19 LAB — COMPREHENSIVE METABOLIC PANEL
ALT: 6 U/L (ref 0–44)
AST: 19 U/L (ref 15–41)
Albumin: 2.6 g/dL — ABNORMAL LOW (ref 3.5–5.0)
Alkaline Phosphatase: 73 U/L (ref 38–126)
Anion gap: 7 (ref 5–15)
BUN: 50 mg/dL — ABNORMAL HIGH (ref 8–23)
CO2: 21 mmol/L — ABNORMAL LOW (ref 22–32)
Calcium: 8.6 mg/dL — ABNORMAL LOW (ref 8.9–10.3)
Chloride: 109 mmol/L (ref 98–111)
Creatinine, Ser: 2.19 mg/dL — ABNORMAL HIGH (ref 0.61–1.24)
GFR calc Af Amer: 29 mL/min — ABNORMAL LOW (ref >60)
GFR calc non Af Amer: 25 mL/min — ABNORMAL LOW (ref >60)
Glucose, Bld: 104 mg/dL — ABNORMAL HIGH (ref 70–99)
Potassium: 4.7 mmol/L (ref 3.5–5.1)
Sodium: 137 mmol/L (ref 135–145)
Total Bilirubin: 0.8 mg/dL (ref 0.3–1.2)
Total Protein: 5.6 g/dL — ABNORMAL LOW (ref 6.5–8.1)

## 2019-02-19 LAB — PROTIME-INR
INR: 1.9 — ABNORMAL HIGH (ref 0.8–1.2)
Prothrombin Time: 22.1 seconds — ABNORMAL HIGH (ref 11.4–15.2)

## 2019-02-19 MED ORDER — SENNOSIDES-DOCUSATE SODIUM 8.6-50 MG PO TABS: 1.0000 | ORAL_TABLET | Freq: Two times a day (BID) | ORAL | Status: DC | PRN

## 2019-02-19 MED ORDER — WARFARIN SODIUM 1 MG PO TABS
1.5000 mg | ORAL_TABLET | Freq: Once | ORAL | Status: AC
  Administered 2019-02-19: 1.5 mg via ORAL
  Filled 2019-02-19: qty 1

## 2019-02-19 NOTE — Consult Note (Signed)
[facility] for Warfarin Dosing Indication: atrial fibrillation  Allergies  Allergen Reactions  . Penicillins Rash and Other (See Comments)    Has patient had a PCN reaction causing immediate rash, facial/tongue/throat swelling, SOB or lightheadedness with hypotension: {no Has patient had a PCN reaction causing severe rash involving mucus membranes or skin necrosis: {no Has patient had a PCN reaction that required hospitalization {no Has patient had a PCN reaction occurring within the last 10 years: {no If all of the above answers are "NO", then may proceed with Cephalosporin use.    Patient Measurements: Height: 5\' 11"  (180.3 cm) Weight: 179 lb (81.2 kg) IBW/kg (Calculated) : 75.3 Heparin Dosing Weight: 60.8 kg  Vital Signs: Temp: 98 F (36.7 C) (11/21 2324) Temp Source: Oral (11/21 2324) BP: 99/54 (11/21 2324) Pulse Rate: 64 (11/21 2324)  Labs: Recent Labs    02/17/19 0350 02/18/19 0608 02/18/19 0841 02/19/19 0430  HGB 8.8*  --  8.1* 8.5*  HCT 26.9*  --  26.0* 27.0*  PLT 144*  --  159 191  LABPROT 15.9* 19.3*  --  22.1*  INR 1.3* 1.6*  --  1.9*  CREATININE 2.14*  --  2.34* 2.19*    Estimated Creatinine Clearance: 22.4 mL/min (A) (by C-G formula based on SCr of 2.19 mg/dL (H)).   Medical History: Past Medical History:  Diagnosis Date  . AICD (automatic cardioverter/defibrillator) present    PACEMAKER  . Anemia   . Cancer ([facility])   . Cardiac pacemaker in situ 07/2005   symptomatic bradycardia  . CKD (chronic kidney disease) stage 3, GFR 30-59 ml/min 04/13/2017  . Diverticulosis of colon (without mention of hemorrhage)   . Dysrhythmia   . 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 GERD (gastroesophageal reflux disease)   . GI hemorrhage   . Hyperlipidemia   . Hypertension   . Lower extremity edema   . Neck arthritis, Severe, multi-level 06/09/2013  .  Paroxysmal atrial fibrillation ([name])    s/p failed ablation  . Peptic ulcer, unspecified site, unspecified as acute or chronic, without mention of hemorrhage, perforation, or obstruction 1975  . Personal history of malignant neoplasm of prostate 12/2000   5 weeks radiation, radiation seeds  . Radiation proctitis   . Sinoatrial node dysfunction ([name])   . Unspecified glaucoma(365.9)   . Unspecified hypothyroidism   . Vitamin B12 deficiency     Medications:  Medications Prior to Admission  Medication Sig Dispense Refill Last Dose  . acetaminophen (TYLENOL) 500 MG tablet Take 500 mg by mouth every 6 (six) hours as needed for moderate pain or headache.   prn at prn  . alendronate (FOSAMAX) 70 MG tablet TAKE 1 TABLET BY MOUTH EVERY 7 DAYS WITH A FULL GLASS OF WATER ON AN EMPTY STOMACH (Patient taking differently: Take 70 mg by mouth once a week. ) 12 tablet 3 Past Week at Unknown time  . amiodarone (PACERONE) 200 MG tablet TAKE 1/2 TABLET BY MOUTH DAILY (Patient taking differently: Take 100 mg by mouth at bedtime. ) 45 tablet 2 02/13/2019 at Unknown time  . Cyanocobalamin (VITAMIN B-12 IJ) Inject 1,000 mcg as directed every 30 (thirty) days.    Past Month at Unknown time  . docusate sodium (COLACE) 50 MG capsule Take 50 mg by mouth 2 (two) times daily as needed for mild constipation.   prn at prn  . dorzolamide-timolol (COSOPT)  22.3-6.8 MG/ML ophthalmic solution Place 1 drop into both eyes 2 (two) times daily.     02/14/2019 at Unknown time  . ferrous sulfate 325 (65 FE) MG EC tablet Take 325 mg by mouth daily with breakfast.     02/14/2019 at Unknown time  . furosemide (LASIX) 20 MG tablet Take 2 tablets (40 mg) by mouth once every other day as directed 180 tablet 1 02/14/2019 at Unknown time  . levothyroxine (SYNTHROID) 100 MCG tablet TAKE 1 TABLET BY MOUTH DAILY BEFORE BREAKFAST (Patient taking differently: Take 100 mcg by mouth daily before breakfast. TAKE 1 TABLET BY MOUTH DAILY BEFORE  BREAKFAST) 90 tablet 3 02/14/2019 at Unknown time  . loperamide (IMODIUM) 2 MG capsule Take 2 mg by mouth as needed for diarrhea or loose stools.   prn at prn  . Multiple Vitamin (MULTIVITAMIN) tablet Take 1 tablet by mouth daily.     02/14/2019 at Unknown time  . pantoprazole (PROTONIX) 40 MG tablet TAKE 1 TABLET(40 MG) BY MOUTH DAILY 90 tablet 1 02/14/2019 at Unknown time  . polyethylene glycol (MIRALAX / GLYCOLAX) packet Take 17 g by mouth daily as needed for moderate constipation.    prn at prn  . pravastatin (PRAVACHOL) 40 MG tablet TAKE ONE-HALF TABLET BY MOUTH DAILY (Patient taking differently: Take 20 mg by mouth at bedtime. ) 45 tablet 3 02/13/2019 at Unknown time  . simethicone (MYLICON) 865 MG chewable tablet Chew 125 mg by mouth every 6 (six) hours as needed for flatulence.   prn at prn  . tamsulosin (FLOMAX) 0.4 MG CAPS capsule TAKE 1 CAPSULE(0.4 MG) BY MOUTH DAILY 90 capsule 0 02/14/2019 at Unknown time  . warfarin (COUMADIN) 1 MG tablet TAKE 1 AND 1/2 TABLETS BY MOUTH DAILY AS DIRECTED 45 tablet 1 02/14/2019 at Unknown time  . Wheat Dextrin (BENEFIBER DRINK MIX PO) Take 1 scoop by mouth daily.    Past Week at Unknown time   Scheduled:  . acetaminophen  1,000 mg Oral TID  . amiodarone  100 mg Oral QHS  . Chlorhexidine Gluconate Cloth  6 each Topical Daily  . docusate sodium  100 mg Oral BID  . dorzolamide-timolol  1 drop Both Eyes BID  . ferrous sulfate  325 mg Oral Q breakfast  . furosemide  40 mg Oral QODAY  . levothyroxine  100 mcg Oral Q0600  . multivitamin with minerals  1 tablet Oral Daily  . pantoprazole  40 mg Oral Daily  . pravastatin  20 mg Oral QHS  . tamsulosin  0.4 mg Oral QPC supper  . Warfarin - Pharmacist Dosing Inpatient   Does not apply q1800   Infusions:  . methocarbamol (ROBAXIN) IV       Assessment: Pharmacy has been consulted to restart Warfarin on 85yo patient with paroxysmal Atrial Fibrillation post right anterior hip hemiarthroplasty. Patient's  home dose regimen is Warfarin 1mg  MWF and 1.5mg  TuThSaSu. Patient was admitted with INR of 4.4 and last Warfarin dose taken was 11/17. Also takes Amiodarone 100mg  daily, which can cause increased INR, so will monitor closely. Warfarin was held due to surgeon wanting INR less than 1.6 prior to procedure. Will resume therapy tonight.  Date      INR     Dose 11/20     1.3      1.5 mg 11/21     1.6      2 mg 11/22     1.9  Goal of Therapy:  INR 2-3 Monitor platelets by  anticoagulation protocol: Yes   Plan:  Will order Warfarin 1.5 mg PO x 1 to be given this evening.    Will recheck INR with AM labs tomorrow morning.  [facility] Clinical Pharmacist 02/19/2019 8:45 AM

## 2019-02-19 NOTE — Progress Notes (Signed)
Physical Therapy Treatment Patient Details Name: [name] [name] MRN: [id number] DOB: June 09, 1922 Today's Date: 02/19/2019    History of Present Illness 85 y/o male s/p fall with R hip fracture, s/p anterior approach hemiarthroplasty 11/19.    PT Comments    Pt ambulates with RW requiring CGA for safety with heavy reliance on UEs with RW, transfers sit>stand with CGA for safety and requiring extra time to assume upright, and performs bed mobility (supine>sit) with mod A for trunk and RLE management. VCs provided during transfers and bed mobility for sequencing and safe hand placement. During ambulation, pt continues to demo unsteadiness on feet with narrow base of support, heavy reliance on UEs with use of RW for balance, and one incident of scissoring while pivoting to sit in chair; pt demos initially flat-foot contact, though able to improve heel strike with cuing; decreased step length bilaterally with mild improvement with verbal cuing. Pt will continue to benefit from skilled PT intervention for improved strength, range of motion, balance for improved functional mobility.   Follow Up Recommendations  SNF     Equipment Recommendations  Rolling walker with 5" wheels    Recommendations for Other Services       Precautions / Restrictions Precautions Precautions: Fall Restrictions Weight Bearing Restrictions: Yes RLE Weight Bearing: Weight bearing as tolerated    Mobility  Bed Mobility Overal bed mobility: Needs Assistance Bed Mobility: Supine to Sit     Supine to sit: Mod assist     General bed mobility comments: VC for sequencing and hand placement; mod A for trunk and RLE management  Transfers Overall transfer level: Needs assistance Equipment used: Rolling walker (2 wheeled) Transfers: Sit to/from Stand Sit to Stand: Min guard         General transfer comment: Pt requiring increased time, VCs for safe hand placement and CGA for  safety  Ambulation/Gait Ambulation/Gait assistance: Min guard Gait Distance (Feet): 30 Feet(x2, to ambulate to bathroom and return to chair in room) Assistive device: Rolling walker (2 wheeled) Gait Pattern/deviations: Step-to pattern;Step-through pattern;Decreased step length - left;Narrow base of support     General Gait Details: Pt continues to demo unsteadiness and narrow BOS, though with only incident of scissoring today, requiring CGA for safety. Pt requiring VCs at times when turning for RW management to maintain close proximity and to remain within support of RW.   Stairs             Wheelchair Mobility    Modified Rankin (Stroke Patients Only)       Balance Overall balance assessment: Needs assistance Sitting-balance support: Bilateral upper extremity supported;Feet supported Sitting balance-Leahy Scale: Fair Sitting balance - Comments: Pt able to sit steady EOB with B UE support and close supervision.   Standing balance support: Bilateral upper extremity supported Standing balance-Leahy Scale: Fair Standing balance comment: Pt with no LOBs, but highly reliant on the walker and showed consistent unsteadiness.                            Cognition Arousal/Alertness: Awake/alert Behavior During Therapy: WFL for tasks assessed/performed Overall Cognitive Status: Within Functional Limits for tasks assessed                                        Exercises Total Joint Exercises Ankle Circles/Pumps: AROM;Both;20 reps;Supine Short Arc Quad: Strengthening;10 reps;Right  Hip ABduction/ADduction: Strengthening;Right;15 reps;Supine    General Comments        Pertinent Vitals/Pain Pain Assessment: 0-10 Pain Score: 7  Pain Location: R hip Pain Descriptors / Indicators: Discomfort Pain Intervention(s): Limited activity within patient's tolerance;Monitored during session;Repositioned    Home Living                      Prior  Function            PT Goals (current goals can now be found in the care plan section) Acute Rehab PT Goals Patient Stated Goal: get stronger at rehab and go home PT Goal Formulation: With patient Time For Goal Achievement: 03/03/19 Potential to Achieve Goals: Good Progress towards PT goals: Progressing toward goals    Frequency    BID      PT Plan Current plan remains appropriate    Co-evaluation              AM-PAC PT "6 Clicks" Mobility   Outcome Measure  Help needed turning from your back to your side while in a flat bed without using bedrails?: A Lot Help needed moving from lying on your back to sitting on the side of a flat bed without using bedrails?: A Lot Help needed moving to and from a bed to a chair (including a wheelchair)?: A Little Help needed standing up from a chair using your arms (e.g., wheelchair or bedside chair)?: A Little Help needed to walk in hospital room?: A Little Help needed climbing 3-5 steps with a railing? : A Lot 6 Click Score: 15    End of Session Equipment Utilized During Treatment: Gait belt Activity Tolerance: Patient tolerated treatment well;No increased pain Patient left: with call bell/phone within reach;in chair;with SCD's reapplied;with chair alarm set Nurse Communication: Mobility status PT Visit Diagnosis: Muscle weakness (generalized) (M62.81);Difficulty in walking, not elsewhere classified (R26.2);Pain Pain - Right/Left: Right Pain - part of body: Hip     Time: 1111-1150 PT Time Calculation (min) (ACUTE ONLY): 39 min  Charges:  $Gait Training: 8-22 mins $Therapeutic Exercise: 8-22 mins $Therapeutic Activity: 8-22 mins                     Petra Kuba, PT, DPT 02/19/19, 12:33 PM

## 2019-02-19 NOTE — Progress Notes (Signed)
Per [employer], LCSW, [facility] will accept patient with negative COVID19 test result from 02/14/2019.

## 2019-02-19 NOTE — Progress Notes (Signed)
PROGRESS NOTE    [name] [name]  OZD:[id number] DOB: [birth year] DOA: 02/14/2019 PCP: Owens Loffler, MD      Brief Narrative:  [name] [name] is a 85 y.o. M with hx pAF on amiodarone and warfarin, bradycardia s/p PPM, CKD IV baseline 1.9-2.2, prostate Ca remote, who presented with fall and right hip pain.  In the ER, radiographs showed evidence of right hip fracture.  Orthopedics were consulted and recommended operative repair.        Assessment & Plan:  Acute right femoral neck fracture S/p right anterior hip hemiarthroplasty 11/19 by Dr. Rudene Christians Mechanical fall, resulted in right femoral neck fracture.  Surgery delayed by elevated and difficult to reverse INR, but he underwent arthroplasty/repair on 25/95, uncomplicated.  Post-op pain has been well controlled.    Creatinine stable relative to baseline, hemoglobin expected drop, stable.  PT recommends SNF.  -Consult orthopedics, appreciate recommendations -Continue acetaminophen and oxycodone as needed for pain    Atrial fibrillation, paroxysmal Pacemaker in place Rate controlled.  CHA2DS2-Vasc 2 for age.  INR up to 1.9 today. -Continue warfarin -Continue amiodarone -Daily INR  Chronic kidney disease stage IV Creatinine stable relative to baseline -Continue furosemide -Continue pravastatin  Anemia of chronic kidney disease Hemoglobin expected postoperative drop, stable today, no clinical bleeding -Continue oral iron  Hypothyroidism -Continue levothyroxine  BPH -Continue Flomax  GERD -Continue pantoprazole  Hyperkalemia Resolved spontaneously  At risk for malnutrition          MDM and disposition: The below labs and imaging reports reviewed and summarized above.  Medication management as above.   The patient was admitted with acute right hip fracture.    His INR was corrected and he underwent hemiarthroplasty 63/87 uncomplicated.  We will now work with physical therapy for placement.  At  baseline the patient is independent with self-cares, ambulatory.  But at present he requires assistance for transfers, all ADLs.  He will need significant acute rehab, and we will continue inpatient cares as we seek a safe discharge plan     DVT prophylaxis: Not applicable, on warfarin Code Status: DNR Family Communication:      Consultants:   Orthopedics  Procedures:   11/19 right anterior hemiarthroplasty  Antimicrobials:       Subjective: No headache chest pain, leg pain, confusion, fever, cough, dyspnea, palpitations.    Objective: Vitals:   02/18/19 0900 02/18/19 1601 02/18/19 2324 02/19/19 1200  BP: 106/62 104/66 (!) 99/54 115/69  Pulse: 61 62 64 64  Resp: 16 18 18    Temp:  97.7 F (36.5 C) 98 F (36.7 C)   TempSrc:  Oral Oral   SpO2: 98% 97% 97% 98%  Weight:      Height:        Intake/Output Summary (Last 24 hours) at 02/19/2019 1223 Last data filed at 02/19/2019 0900 Gross per 24 hour  Intake 480 ml  Output 850 ml  Net -370 ml   Filed Weights   02/15/19 0600  Weight: 81.2 kg    Examination: General appearance: Thin elderly adult male, alert and in no acute distress.  Lying in bed, interactive. HEENT: Anicteric, conjunctiva pink, lids and lashes normal. No nasal deformity, discharge, epistaxis.  Lips moist, teeth normal. OP normal, no oral lesions.  Hard of hearing  skin: Warm and dry.  No suspicious rashes or lesions. Cardiac: RRR, no murmurs appreciated.  No LE edema.    Respiratory: Normal respiratory rate and rhythm.  CTAB without rales or wheezes. Abdomen: Abdomen  soft.  No TTP or guarding. No ascites, distension, hepatosplenomegaly.   MSK: No deformities or effusions of the large joints of the upper or lower extremities bilaterally.  Diffuse loss of subcutaneous muscle mass and fat. Neuro: Awake and alert. Naming is grossly intact, and the patient's recall, recent and remote, as well as general fund of knowledge seem within normal limits.   Muscle tone diminished, without fasciculations.  Moves all extremities equally but with generalized weakness. Speech fluent.    Psych: Sensorium intact and responding to questions, attention normal. Affect normal.  Judgment and insight appear normal.          Data Reviewed: I have personally reviewed following labs and imaging studies:  CBC: Recent Labs  Lab 02/15/19 0420 02/16/19 0524 02/17/19 0350 02/18/19 0841 02/19/19 0430  WBC 6.3 6.9 9.7 9.6 8.2  HGB 9.3* 9.1* 8.8* 8.1* 8.5*  HCT 29.3* 27.8* 26.9* 26.0* 27.0*  MCV 100.7* 98.6 97.8 102.8* 101.5*  PLT 151 141* 144* 159 431   Basic Metabolic Panel: Recent Labs  Lab 02/15/19 0420 02/16/19 0524 02/17/19 0350 02/18/19 0841 02/19/19 0430  NA 137 135 134* 135 137  K 4.6 4.7 5.4* 5.1 4.7  CL 108 108 110 108 109  CO2 20* 19* 18* 20* 21*  GLUCOSE 86 102* 232* 100* 104*  BUN 35* 39* 36* 46* 50*  CREATININE 2.08* 2.23* 2.14* 2.34* 2.19*  CALCIUM 8.3* 8.4* 8.1* 8.3* 8.6*   GFR: Estimated Creatinine Clearance: 22.4 mL/min (A) (by C-G formula based on SCr of 2.19 mg/dL (H)). Liver Function Tests: Recent Labs  Lab 02/19/19 0430  AST 19  ALT 6  ALKPHOS 73  BILITOT 0.8  PROT 5.6*  ALBUMIN 2.6*   No results for input(s): LIPASE, AMYLASE in the last 168 hours. No results for input(s): AMMONIA in the last 168 hours. Coagulation Profile: Recent Labs  Lab 02/16/19 0524 02/16/19 1145 02/17/19 0350 02/18/19 0608 02/19/19 0430  INR 1.7* 1.5* 1.3* 1.6* 1.9*   Cardiac Enzymes: No results for input(s): CKTOTAL, CKMB, CKMBINDEX, TROPONINI in the last 168 hours. BNP (last 3 results) No results for input(s): PROBNP in the last 8760 hours. HbA1C: No results for input(s): HGBA1C in the last 72 hours. CBG: Recent Labs  Lab 02/18/19 0740  GLUCAP 90   Lipid Profile: No results for input(s): CHOL, HDL, LDLCALC, TRIG, CHOLHDL, LDLDIRECT in the last 72 hours. Thyroid Function Tests: No results for input(s): TSH,  T4TOTAL, FREET4, T3FREE, THYROIDAB in the last 72 hours. Anemia Panel: No results for input(s): VITAMINB12, FOLATE, FERRITIN, TIBC, IRON, RETICCTPCT in the last 72 hours. Urine analysis:    Component Value Date/Time   COLORURINE YELLOW (A) 02/14/2019 1141   APPEARANCEUR CLEAR (A) 02/14/2019 1141   APPEARANCEUR Cloudy (A) 02/10/2019 1341   LABSPEC 1.015 02/14/2019 1141   PHURINE 5.0 02/14/2019 1141   GLUCOSEU NEGATIVE 02/14/2019 1141   HGBUR MODERATE (A) 02/14/2019 1141   BILIRUBINUR NEGATIVE 02/14/2019 1141   BILIRUBINUR Negative 02/10/2019 1341   KETONESUR NEGATIVE 02/14/2019 1141   PROTEINUR 100 (A) 02/14/2019 1141   UROBILINOGEN 0.2 12/09/2016 1131   NITRITE NEGATIVE 02/14/2019 1141   LEUKOCYTESUR LARGE (A) 02/14/2019 1141   Sepsis Labs: @LABRCNTIP (procalcitonin:4,lacticacidven:4)  ) Recent Results (from the past 240 hour(s))  Microscopic Examination     Status: Abnormal   Collection Time: 02/10/19  1:41 PM   URINE  Result Value Ref Range Status   WBC, UA >30 (A) 0 - 5 /hpf Final   RBC 11-30 (A) 0 -  2 /hpf Final   Epithelial Cells (non renal) 0-10 0 - 10 /hpf Final   Crystals Present (A) N/A Final   Crystal Type Amorphous Sediment N/A Final   Bacteria, UA Many (A) None seen/Few Final  Urine culture     Status: Abnormal   Collection Time: 02/14/19 11:41 AM   Specimen: Urine, Random  Result Value Ref Range Status   Specimen Description   Final    URINE, RANDOM Performed at [facility], [address], [place], [place] [address]    Special Requests   Final    NONE Performed at [facility], [address] [place], [place] [address]    Culture (A)  Final    <10,000 COLONIES/mL INSIGNIFICANT GROWTH Performed at [facility], [place] [address], [place], [place] [address]    Report Status 02/15/2019 FINAL  Final  SARS CORONAVIRUS 2 (TAT 6-24 HRS) Nasopharyngeal Nasopharyngeal Swab     Status: None   Collection Time: 02/14/19  3:36 PM    Specimen: Nasopharyngeal Swab  Result Value Ref Range Status   SARS Coronavirus 2 NEGATIVE NEGATIVE Final    Comment: (NOTE) SARS-CoV-2 target nucleic acids are NOT DETECTED. The SARS-CoV-2 RNA is generally detectable in upper and lower respiratory specimens during the acute phase of infection. Negative results do not preclude SARS-CoV-2 infection, do not rule out co-infections with other pathogens, and should not be used as the sole basis for treatment or other patient management decisions. Negative results must be combined with clinical observations, patient history, and epidemiological information. The expected result is Negative. Fact Sheet for Patients: SugarRoll.be Fact Sheet for Healthcare Providers: https://www.woods-mathews.com/ This test is not yet approved or cleared by the [place] FDA and  has been authorized for detection and/or diagnosis of SARS-CoV-2 by FDA under an Emergency Use Authorization (EUA). This EUA will remain  in effect (meaning this test can be used) for the duration of the COVID-19 declaration under Section 56 4(b)(1) of the Act, 21 U.S.C. section 360bbb-3(b)(1), unless the authorization is terminated or revoked sooner. Performed at [facility], [place] [address] [place], [place] [address]          Radiology Studies: No results found.      Scheduled Meds: . acetaminophen  1,000 mg Oral TID  . amiodarone  100 mg Oral QHS  . Chlorhexidine Gluconate Cloth  6 each Topical Daily  . docusate sodium  100 mg Oral BID  . dorzolamide-timolol  1 drop Both Eyes BID  . ferrous sulfate  325 mg Oral Q breakfast  . furosemide  40 mg Oral QODAY  . levothyroxine  100 mcg Oral Q0600  . multivitamin with minerals  1 tablet Oral Daily  . pantoprazole  40 mg Oral Daily  . pravastatin  20 mg Oral QHS  . tamsulosin  0.4 mg Oral QPC supper  . warfarin  1.5 mg Oral ONCE-1800  . Warfarin - Pharmacist Dosing  Inpatient   Does not apply q1800   Continuous Infusions: . methocarbamol (ROBAXIN) IV       LOS: 5 days    Time spent: 25 minutes    Edwin Dada, MD [facility] 02/19/2019, 12:23 PM     Please page though [place] or Epic secure chat:  For [employer], Adult nurse

## 2019-02-19 NOTE — Progress Notes (Signed)
Subjective: 3 Days Post-Op Procedure(s) (LRB): RIGHT ANTERIOR HIP HEMIARTHROPLASTY,CEMENTED (Right) Patient reports pain as mild.   Patient seen in rounds with Dr. Marry Guan. Patient is well, and has had no acute complaints or problems Plan is to go home versus Rehab after hospital stay. Negative for chest pain and shortness of breath Fever: no Gastrointestinal: Negative for nausea and vomiting  Objective: Vital signs in last 24 hours: Temp:  [97.7 F (36.5 C)-98 F (36.7 C)] 98 F (36.7 C) (11/21 2324) Pulse Rate:  [61-64] 64 (11/21 2324) Resp:  [16-18] 18 (11/21 2324) BP: (99-106)/(54-66) 99/54 (11/21 2324) SpO2:  [97 %-98 %] 97 % (11/21 2324)  Intake/Output from previous day:  Intake/Output Summary (Last 24 hours) at 02/19/2019 0609 Last data filed at 02/19/2019 0505 Gross per 24 hour  Intake 480 ml  Output 1200 ml  Net -720 ml    Intake/Output this shift: Total I/O In: -  Out: 350 [Urine:350]  Labs: Recent Labs    02/17/19 0350 02/18/19 0841 02/19/19 0430  HGB 8.8* 8.1* 8.5*   Recent Labs    02/18/19 0841 02/19/19 0430  WBC 9.6 8.2  RBC 2.53* 2.66*  HCT 26.0* 27.0*  PLT 159 191   Recent Labs    02/18/19 0841 02/19/19 0430  NA 135 137  K 5.1 4.7  CL 108 109  CO2 20* 21*  BUN 46* 50*  CREATININE 2.34* 2.19*  GLUCOSE 100* 104*  CALCIUM 8.3* 8.6*   Recent Labs    02/18/19 0608 02/19/19 0430  INR 1.6* 1.9*     EXAM General - Patient is Alert and Oriented Extremity - Neurovascular intact Sensation intact distally Dorsiflexion/Plantar flexion intact Compartment soft Dressing/Incision - clean, dry, with the wound VAC in place Motor Function - intact, moving foot and toes well on exam.  Ambulated 100 feet with physical therapy  Past Medical History:  Diagnosis Date  . AICD (automatic cardioverter/defibrillator) present    PACEMAKER  . Anemia   . Cancer ([place])   . Cardiac pacemaker in situ 07/2005   symptomatic bradycardia  . CKD  (chronic kidney disease) stage 3, GFR 30-59 ml/min 04/13/2017  . Diverticulosis of colon (without mention of hemorrhage)   . Dysrhythmia   . 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 GERD (gastroesophageal reflux disease)   . GI hemorrhage   . Hyperlipidemia   . Hypertension   . Lower extremity edema   . Neck arthritis, Severe, multi-level 06/09/2013  . Paroxysmal atrial fibrillation ([name])    s/p failed ablation  . Peptic ulcer, unspecified site, unspecified as acute or chronic, without mention of hemorrhage, perforation, or obstruction 1975  . Personal history of malignant neoplasm of prostate 12/2000   5 weeks radiation, radiation seeds  . Radiation proctitis   . Sinoatrial node dysfunction ([name])   . Unspecified glaucoma(365.9)   . Unspecified hypothyroidism   . Vitamin B12 deficiency     Assessment/Plan: 3 Days Post-Op Procedure(s) (LRB): RIGHT ANTERIOR HIP HEMIARTHROPLASTY,CEMENTED (Right) Active Problems:   AF (paroxysmal atrial fibrillation) ([name])   Chronic renal failure, stage 3b   Closed right hip fracture, initial encounter ([name])   Closed fracture of neck of right femur ([name])   Longstanding persistent atrial fibrillation ([name])   Warfarin anticoagulation  Estimated body mass index is 24.97 kg/m as calculated from the following:   Height as of this encounter: 5\' 11"  (1.803 m).   Weight as of  this encounter: 81.2 kg. Advance diet Up with therapy D/C IV fluids  Discharge planning to rehab.  Discharge per medicine  DVT Prophylaxis - Coumadin, Foot Pumps and TED hose Weight-Bearing as tolerated to right leg  Reche Dixon, PA-C Orthopaedic Surgery 02/19/2019, 6:09 AM

## 2019-02-20 LAB — PROTIME-INR
INR: 2.2 — ABNORMAL HIGH (ref 0.8–1.2)
Prothrombin Time: 24.4 seconds — ABNORMAL HIGH (ref 11.4–15.2)

## 2019-02-20 LAB — SURGICAL PATHOLOGY

## 2019-02-20 MED ORDER — WARFARIN SODIUM 1 MG PO TABS
1.0000 mg | ORAL_TABLET | Freq: Once | ORAL | Status: DC
  Filled 2019-02-20: qty 1

## 2019-02-20 NOTE — Progress Notes (Signed)
Report called to [facility] 

## 2019-02-20 NOTE — TOC Transition Note (Signed)
Transition of Care [facility]) - CM/SW Discharge Note   Patient Details  Name: [name] [name] MRN: [id number] Date of Birth: 03/20/1923  Transition of Care [facility]) CM/SW Contact:  Shelbie Hutching, RN Phone Number: 02/20/2019, 2:15 PM   Clinical Narrative:    Patient is ready for discharge to [facility] today, he will transport via [facility].  Patient is going to room 509.  Bedside RN will call report to [phone].  Patient's daughter is at the bedside and will take the patient's belongings over to [facility] once patient is picked up by EMS.    Final next level of care: Skilled Nursing Facility Barriers to Discharge: Barriers Resolved   Patient Goals and CMS Choice   CMS Medicare.gov Compare Post Acute Care list provided to:: Patient Choice offered to / list presented to : Patient  Discharge Placement              Patient chooses bed at: [facility] Patient to be transferred to facility by: [facility] Name of family member notified: [name]- daughter Patient and family notified of of transfer: 02/20/19  Discharge Plan and Services   Discharge Planning Services: CM Consult Post Acute Care Choice: [place]                               Social Determinants of Health (SDOH) Interventions     Readmission Risk Interventions No flowsheet data found.

## 2019-02-20 NOTE — Progress Notes (Signed)
Physical Therapy Treatment Patient Details Name: [name] [name] MRN: [id number] DOB: 28-Mar-1923 Today's Date: 02/20/2019    History of Present Illness [name] [name] is a 85 y/o male s/p fall with R hip fracture, s/p anterior approach hemiarthroplasty 11/19. Pt is WBAT.    PT Comments    Pt in bed upon arrival, agreeable to participate. Pain well controlled in session, does not appear to be a significant limiter of activity. Pt does well with exercises in bed, minimal physical assist needed for selected exercises, but has good strong activation of quads to full range knee extension. ModA to EOB, modA to standing, AMB in room slowly, but steady with RW. Pt has some dizziness throughout that does not worse. Pt progressing well. Pt has some slight melanotic incontinence in session, but H&H appear stable per CBC prior to session.      Follow Up Recommendations  SNF     Equipment Recommendations  Rolling walker with 5" wheels    Recommendations for Other Services       Precautions / Restrictions Precautions Precautions: Fall Restrictions RLE Weight Bearing: Weight bearing as tolerated    Mobility  Bed Mobility Overal bed mobility: Needs Assistance Bed Mobility: Supine to Sit     Supine to sit: Mod assist     General bed mobility comments: trunk and arm weakness limiting  Transfers Overall transfer level: Needs assistance Equipment used: Rolling walker (2 wheeled) Transfers: Sit to/from Stand Sit to Stand: Mod assist         General transfer comment: Practiced 3x again from reclner + pillow, still requires min-modA for stabiliy and lift assist.  Ambulation/Gait Ambulation/Gait assistance: Supervision Gait Distance (Feet): 80 Feet Assistive device: Rolling walker (2 wheeled) Gait Pattern/deviations: Step-to pattern     General Gait Details: safe use of RW and navigating obstacles in room; some melanotic/gray stool drippage on floor during gait, pt cued to avoid  for safety. Pt assisted with pericare after AMB.   Stairs             Wheelchair Mobility    Modified Rankin (Stroke Patients Only)       Balance Overall balance assessment: Needs assistance Sitting-balance support: Single extremity supported;Feet supported Sitting balance-Leahy Scale: Fair     Standing balance support: Bilateral upper extremity supported;During functional activity Standing balance-Leahy Scale: Poor                              Cognition Arousal/Alertness: Awake/alert Behavior During Therapy: WFL for tasks assessed/performed Overall Cognitive Status: Within Functional Limits for tasks assessed                                        Exercises General Exercises - Lower Extremity Ankle Circles/Pumps: AROM;20 reps;Supine Short Arc Quad: AROM;Right;15 reps;Supine Long Arc Quad: AROM;Both;15 reps;Seated Heel Slides: AAROM;Right;15 reps;Supine Hip ABduction/ADduction: AAROM;Supine;15 reps;Right    General Comments        Pertinent Vitals/Pain Pain Assessment: Faces Faces Pain Scale: Hurts a little bit Pain Intervention(s): Limited activity within patient's tolerance;Monitored during session;Repositioned    Home Living                      Prior Function            PT Goals (current goals can now be found in the care plan  section) Acute Rehab PT Goals Patient Stated Goal: get stronger at rehab and go home PT Goal Formulation: With patient Time For Goal Achievement: 03/03/19 Potential to Achieve Goals: Good Progress towards PT goals: Progressing toward goals    Frequency    BID      PT Plan Current plan remains appropriate    Co-evaluation              AM-PAC PT "6 Clicks" Mobility   Outcome Measure  Help needed turning from your back to your side while in a flat bed without using bedrails?: A Lot Help needed moving from lying on your back to sitting on the side of a flat bed without  using bedrails?: A Lot Help needed moving to and from a bed to a chair (including a wheelchair)?: A Lot Help needed standing up from a chair using your arms (e.g., wheelchair or bedside chair)?: A Lot Help needed to walk in hospital room?: A Little Help needed climbing 3-5 steps with a railing? : A Little 6 Click Score: 14    End of Session Equipment Utilized During Treatment: Gait belt Activity Tolerance: Patient tolerated treatment well;No increased pain Patient left: with call bell/phone within reach;in chair;with chair alarm set;with family/visitor present Nurse Communication: Mobility status PT Visit Diagnosis: Muscle weakness (generalized) (M62.81);Difficulty in walking, not elsewhere classified (R26.2);Pain Pain - Right/Left: Right Pain - part of body: Hip     Time: 1007-1219 PT Time Calculation (min) (ACUTE ONLY): 30 min  Charges:  $Gait Training: 8-22 mins $Therapeutic Exercise: 8-22 mins                     12:46 PM, 02/20/19 Etta Grandchild, PT, DPT Physical Therapist - [facility]  [phone] ([place])      Reizel Calzada C 02/20/2019, 12:43 PM

## 2019-02-20 NOTE — Discharge Instructions (Signed)

## 2019-02-20 NOTE — Discharge Summary (Addendum)
[facility] NAME: [name] [name]    MR#:  [id number]  DATE OF BIRTH:  1922-06-05  DATE OF ADMISSION:  02/14/2019   ADMITTING PHYSICIAN: Loletha Grayer, MD  DATE OF DISCHARGE: 02/20/2019  PRIMARY CARE PHYSICIAN: Owens Loffler, MD   ADMISSION DIAGNOSIS:  Warfarin anticoagulation [Z79.01] Closed fracture of neck of right femur, initial encounter ([place]) [S72.001A] Longstanding persistent atrial fibrillation ([name]) [I48.11] DISCHARGE DIAGNOSIS:  Active Problems:   AF (paroxysmal atrial fibrillation) ([name])   Chronic renal failure, stage 3b   Closed right hip fracture, initial encounter ([name])   Closed fracture of neck of right femur ([name])   Longstanding persistent atrial fibrillation ([name])   Warfarin anticoagulation  SECONDARY DIAGNOSIS:   Past Medical History:  Diagnosis Date  . AICD (automatic cardioverter/defibrillator) present    PACEMAKER  . Anemia   . Cancer ([place])   . Cardiac pacemaker in situ 07/2005   symptomatic bradycardia  . CKD (chronic kidney disease) stage 3, GFR 30-59 ml/min 04/13/2017  . Diverticulosis of colon (without mention of hemorrhage)   . Dysrhythmia   . 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 GERD (gastroesophageal reflux disease)   . GI hemorrhage   . Hyperlipidemia   . Hypertension   . Lower extremity edema   . Neck arthritis, Severe, multi-level 06/09/2013  . Paroxysmal atrial fibrillation ([name])    s/p failed ablation  . Peptic ulcer, unspecified site, unspecified as acute or chronic, without mention of hemorrhage, perforation, or obstruction 1975  . Personal history of malignant neoplasm of prostate 12/2000   5 weeks radiation, radiation seeds  . Radiation proctitis   . Sinoatrial node dysfunction ([name])   . Unspecified glaucoma(365.9)   . Unspecified hypothyroidism   . Vitamin B12 deficiency    HOSPITAL COURSE:   Acute right femoral  neck fracture - s/p right anterior hip hemiarthroplasty 11/19 by Dr. Rudene Christians Mechanical fall, resulted in right femoral neck fracture.  Surgery delayed by elevated and difficult to reverse INR, but he underwent arthroplasty/repair on 56/31, uncomplicated. - Post-op pain has been well controlled.   - Creatinine stable relative to baseline, hemoglobin expected drop, stable.  PT recommends SNF and he has a bed at [facility].  Atrial fibrillation, paroxysmal Pacemaker in place Rate controlled.  CHA2DS2-Vasc 2 for age.  INR up to 2.2 today. -Continue warfarin -Continue amiodarone  Chronic kidney disease stage IV Creatinine stable - at baseline -Continue furosemide -Continue pravastatin  Anemia of chronic kidney disease Hemoglobin expected postoperative drop, stable today, no clinical bleeding -Continue oral iron  Hypothyroidism -Continue levothyroxine  BPH -Continue Flomax  GERD -Continue pantoprazole  Hyperkalemia Resolved spontaneously  Severe protein calorie malnutrition  DISCHARGE CONDITIONS:  Fair CONSULTS OBTAINED:  Treatment Team:  Hessie Knows, MD DRUG ALLERGIES:   Allergies  Allergen Reactions  . Penicillins Rash and Other (See Comments)    Has patient had a PCN reaction causing immediate rash, facial/tongue/throat swelling, SOB or lightheadedness with hypotension: {no Has patient had a PCN reaction causing severe rash involving mucus membranes or skin necrosis: {no Has patient had a PCN reaction that required hospitalization {no Has patient had a PCN reaction occurring within the last 10 years: {no If all of the above answers are "NO", then may proceed with Cephalosporin use.   DISCHARGE MEDICATIONS:   Allergies as of 02/20/2019      Reactions   Penicillins Rash, Other (  See Comments)   Has patient had a PCN reaction causing immediate rash, facial/tongue/throat swelling, SOB or lightheadedness with hypotension: {no Has patient had a PCN reaction  causing severe rash involving mucus membranes or skin necrosis: {no Has patient had a PCN reaction that required hospitalization {no Has patient had a PCN reaction occurring within the last 10 years: {no If all of the above answers are "NO", then may proceed with Cephalosporin use.      Medication List    TAKE these medications   acetaminophen 500 MG tablet Commonly known as: TYLENOL Take 500 mg by mouth every 6 (six) hours as needed for moderate pain or headache.   alendronate 70 MG tablet Commonly known as: FOSAMAX TAKE 1 TABLET BY MOUTH EVERY 7 DAYS WITH A FULL GLASS OF WATER ON AN EMPTY STOMACH What changed: See the new instructions.   amiodarone 200 MG tablet Commonly known as: PACERONE TAKE 1/2 TABLET BY MOUTH DAILY What changed: when to take this   BENEFIBER DRINK MIX PO Take 1 scoop by mouth daily.   docusate sodium 50 MG capsule Commonly known as: COLACE Take 50 mg by mouth 2 (two) times daily as needed for mild constipation.   dorzolamide-timolol 22.3-6.8 MG/ML ophthalmic solution Commonly known as: COSOPT Place 1 drop into both eyes 2 (two) times daily.   ferrous sulfate 325 (65 FE) MG EC tablet Take 325 mg by mouth daily with breakfast.   furosemide 20 MG tablet Commonly known as: LASIX Take 2 tablets (40 mg) by mouth once every other day as directed   levothyroxine 100 MCG tablet Commonly known as: SYNTHROID TAKE 1 TABLET BY MOUTH DAILY BEFORE BREAKFAST What changed:   how much to take  how to take this  when to take this   loperamide 2 MG capsule Commonly known as: IMODIUM Take 2 mg by mouth as needed for diarrhea or loose stools.   multivitamin tablet Take 1 tablet by mouth daily.   oxyCODONE 5 MG immediate release tablet Commonly known as: Oxy IR/ROXICODONE Take 1 tablet (5 mg total) by mouth every 4 (four) hours as needed for moderate pain.   pantoprazole 40 MG tablet Commonly known as: PROTONIX TAKE 1 TABLET(40 MG) BY MOUTH DAILY    polyethylene glycol 17 g packet Commonly known as: MIRALAX / GLYCOLAX Take 17 g by mouth daily as needed for moderate constipation.   pravastatin 40 MG tablet Commonly known as: PRAVACHOL TAKE ONE-HALF TABLET BY MOUTH DAILY What changed: when to take this   simethicone 125 MG chewable tablet Commonly known as: MYLICON Chew 324 mg by mouth every 6 (six) hours as needed for flatulence.   tamsulosin 0.4 MG Caps capsule Commonly known as: FLOMAX TAKE 1 CAPSULE(0.4 MG) BY MOUTH DAILY   VITAMIN B-12 IJ Inject 1,000 mcg as directed every 30 (thirty) days.   warfarin 1 MG tablet Commonly known as: COUMADIN Take as directed. If you are unsure how to take this medication, talk to your nurse or doctor. Original instructions: TAKE 1 AND 1/2 TABLETS BY MOUTH DAILY AS DIRECTED      DISCHARGE INSTRUCTIONS:  Avoid narcotics if at all possible, recheck INR on 1125 and 1127 with results to PCP for warfarin dose adjustment DIET:  Cardiac diet DISCHARGE CONDITION:  Fair ACTIVITY:  Activity as tolerated OXYGEN:  Home Oxygen: No.  Oxygen Delivery: room air DISCHARGE LOCATION:  nursing home with palliative care to follow -consider transition to hospice if and when appropriate  If you experience  worsening of your admission symptoms, develop shortness of breath, life threatening emergency, suicidal or homicidal thoughts you must seek medical attention immediately by calling 911 or calling your MD immediately  if symptoms less severe.  You Must read complete instructions/literature along with all the possible adverse reactions/side effects for all the Medicines you take and that have been prescribed to you. Take any new Medicines after you have completely understood and accpet all the possible adverse reactions/side effects.   Please note  You were cared for by a hospitalist during your hospital stay. If you have any questions about your discharge medications or the care you received while you  were in the hospital after you are discharged, you can call the unit and asked to speak with the hospitalist on call if the hospitalist that took care of you is not available. Once you are discharged, your primary care physician will handle any further medical issues. Please note that NO REFILLS for any discharge medications will be authorized once you are discharged, as it is imperative that you return to your primary care physician (or establish a relationship with a primary care physician if you do not have one) for your aftercare needs so that they can reassess your need for medications and monitor your lab values.    On the day of Discharge:  VITAL SIGNS:  Blood pressure 110/65, pulse 89, temperature 97.8 F (36.6 C), temperature source Oral, resp. rate 17, height 5\' 11"  (1.803 m), weight 81.2 kg, SpO2 94 %. PHYSICAL EXAMINATION:  GENERAL:  85 y.o.-year-old patient lying in the bed with no acute distress.  EYES: Pupils equal, round, reactive to light and accommodation. No scleral icterus. Extraocular muscles intact.  HEENT: Head atraumatic, normocephalic. Oropharynx and nasopharynx clear.  NECK:  Supple, no jugular venous distention. No thyroid enlargement, no tenderness.  LUNGS: Normal breath sounds bilaterally, no wheezing, rales,rhonchi or crepitation. No use of accessory muscles of respiration.  CARDIOVASCULAR: S1, S2 normal. No murmurs, rubs, or gallops.  ABDOMEN: Soft, non-tender, non-distended. Bowel sounds present. No organomegaly or mass.  EXTREMITIES: No pedal edema, cyanosis, or clubbing.  NEUROLOGIC: Cranial nerves II through XII are intact. Muscle strength 5/5 in all extremities. Sensation intact. Gait not checked.  PSYCHIATRIC: The patient is alert and oriented x 3.  SKIN: No obvious rash, lesion, or ulcer.  DATA REVIEW:   CBC Recent Labs  Lab 02/19/19 0430  WBC 8.2  HGB 8.5*  HCT 27.0*  PLT 191    Chemistries  Recent Labs  Lab 02/19/19 0430  NA 137  K 4.7  CL  109  CO2 21*  GLUCOSE 104*  BUN 50*  CREATININE 2.19*  CALCIUM 8.6*  AST 19  ALT 6  ALKPHOS 73  BILITOT 0.8      Contact information for follow-up providers    Duanne Guess, PA-C. Schedule an appointment as soon as possible for a visit in 2 week(s).   Specialties: Orthopedic Surgery, Emergency Medicine Why: For staple removal Contact information: [place] [address] [phone]        Owens Loffler, MD. Schedule an appointment as soon as possible for a visit in 1 week(s).   Specialty: Family Medicine Contact information: [place] [address] [phone]            Contact information for after-discharge care    [facility] .   Service: Skilled Chiropodist information: [address]  [place] [phone]                  Management plans discussed with the patient, family (daughter over phone) and they are in agreement.  CODE STATUS: DNR   TOTAL TIME TAKING CARE OF THIS PATIENT: 45 minutes.    Max Sane M.D on 02/20/2019 at 12:40 PM  Between 7am to 6pm - Pager - [phone]  After 6pm go to [email or web address] - password [email or web address]  [facility]   CC: Primary care physician; Owens Loffler, MD   Note: This dictation was prepared with Dragon dictation along with smaller phrase technology. Any transcriptional errors that result from this process are unintentional.

## 2019-02-20 NOTE — Progress Notes (Signed)
Subjective: 4 Days Post-Op Procedure(s) (LRB): RIGHT ANTERIOR HIP HEMIARTHROPLASTY,CEMENTED (Right) Patient reports hip pain as mild.  Reports small BM last night. Patient is well, and has had no acute complaints or problems Negative for chest pain and shortness of breath Fever: no Gastrointestinal: Negative for nausea and vomiting  Objective: Vital signs in last 24 hours: Temp:  [97.8 F (36.6 C)-98.7 F (37.1 C)] 97.8 F (36.6 C) (11/23 0726) Pulse Rate:  [59-97] 89 (11/23 0726) Resp:  [17-18] 17 (11/23 0726) BP: (95-115)/(53-69) 110/65 (11/23 0726) SpO2:  [94 %-100 %] 94 % (11/23 0726)  Intake/Output from previous day:  Intake/Output Summary (Last 24 hours) at 02/20/2019 0739 Last data filed at 02/19/2019 1700 Gross per 24 hour  Intake 720 ml  Output -  Net 720 ml    Intake/Output this shift: No intake/output data recorded.  Labs: Recent Labs    02/18/19 0841 02/19/19 0430  HGB 8.1* 8.5*   Recent Labs    02/18/19 0841 02/19/19 0430  WBC 9.6 8.2  RBC 2.53* 2.66*  HCT 26.0* 27.0*  PLT 159 191   Recent Labs    02/18/19 0841 02/19/19 0430  NA 135 137  K 5.1 4.7  CL 108 109  CO2 20* 21*  BUN 46* 50*  CREATININE 2.34* 2.19*  GLUCOSE 100* 104*  CALCIUM 8.3* 8.6*   Recent Labs    02/19/19 0430 02/20/19 0422  INR 1.9* 2.2*     EXAM General - Patient is Alert and Oriented Extremity - Neurovascular intact Sensation intact distally Dorsiflexion/Plantar flexion intact Compartment soft Dressing/Incision - clean, dry, with the Praveena intact without drainage. Motor Function - intact, moving foot and toes well on exam.    Past Medical History:  Diagnosis Date  . AICD (automatic cardioverter/defibrillator) present    PACEMAKER  . Anemia   . Cancer ([place])   . Cardiac pacemaker in situ 07/2005   symptomatic bradycardia  . CKD (chronic kidney disease) stage 3, GFR 30-59 ml/min 04/13/2017  . Diverticulosis of colon (without mention of hemorrhage)    . Dysrhythmia   . 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 GERD (gastroesophageal reflux disease)   . GI hemorrhage   . Hyperlipidemia   . Hypertension   . Lower extremity edema   . Neck arthritis, Severe, multi-level 06/09/2013  . Paroxysmal atrial fibrillation ([name])    s/p failed ablation  . Peptic ulcer, unspecified site, unspecified as acute or chronic, without mention of hemorrhage, perforation, or obstruction 1975  . Personal history of malignant neoplasm of prostate 12/2000   5 weeks radiation, radiation seeds  . Radiation proctitis   . Sinoatrial node dysfunction ([name])   . Unspecified glaucoma(365.9)   . Unspecified hypothyroidism   . Vitamin B12 deficiency     Assessment/Plan: 4 Days Post-Op Procedure(s) (LRB): RIGHT ANTERIOR HIP HEMIARTHROPLASTY,CEMENTED (Right) Active Problems:   AF (paroxysmal atrial fibrillation) ([name])   Chronic renal failure, stage 3b   Closed right hip fracture, initial encounter ([name])   Closed fracture of neck of right femur ([name])   Longstanding persistent atrial fibrillation ([name])   Warfarin anticoagulation  Estimated body mass index is 24.97 kg/m as calculated from the following:   Height as of this encounter: 5\' 11"  (1.803 m).   Weight as of this encounter: 81.2 kg.  Continue with physical therapy, weightbearing as tolerated right lower extremity Pain well controlled Plan on discharge to skilled nursing facility  Follow-up with [facility] orthopedics in 2 weeks Remove Praveena negative pressure dressing on 02/27/2019 and apply honeycomb dressing Continue with compression stockings during the daytime 6 weeks  DVT Prophylaxis - Coumadin, Foot Pumps and TED hose Weight-Bearing as tolerated to right leg  Duanne Guess PA-C Orthopaedic Surgery 02/20/2019, 7:39 AM

## 2019-02-20 NOTE — Progress Notes (Signed)
Non emergent transport notified that patient will need transpotation services to [facility] today. All personal belongings with patient. Patient displays no s/sx of distress. Wound vac switched to [place].   Discharge instructions and follow up appts are with patient in confidential envelope.

## 2019-02-20 NOTE — Care Management Important Message (Signed)
Important Message  Patient Details  Name: [name] [name] MRN: [id number] Date of Birth: 06-Feb-1923   Medicare Important Message Given:  Yes     Juliann Pulse A Gorman Safi 02/20/2019, 11:15 AM

## 2019-02-20 NOTE — Consult Note (Signed)
[place] for Warfarin Dosing Indication: atrial fibrillation  Allergies  Allergen Reactions  . Penicillins Rash and Other (See Comments)    Has patient had a PCN reaction causing immediate rash, facial/tongue/throat swelling, SOB or lightheadedness with hypotension: {no Has patient had a PCN reaction causing severe rash involving mucus membranes or skin necrosis: {no Has patient had a PCN reaction that required hospitalization {no Has patient had a PCN reaction occurring within the last 10 years: {no If all of the above answers are "NO", then may proceed with Cephalosporin use.    Patient Measurements: Height: 5\' 11"  (180.3 cm) Weight: 179 lb (81.2 kg) IBW/kg (Calculated) : 75.3 Heparin Dosing Weight: 60.8 kg  Vital Signs: Temp: 97.8 F (36.6 C) (11/23 0726) Temp Source: Oral (11/23 0726) BP: 110/65 (11/23 0726) Pulse Rate: 89 (11/23 0726)  Labs: Recent Labs    02/18/19 0608 02/18/19 0841 02/19/19 0430 02/20/19 0422  HGB  --  8.1* 8.5*  --   HCT  --  26.0* 27.0*  --   PLT  --  159 191  --   LABPROT 19.3*  --  22.1* 24.4*  INR 1.6*  --  1.9* 2.2*  CREATININE  --  2.34* 2.19*  --     Estimated Creatinine Clearance: 22.4 mL/min (A) (by C-G formula based on SCr of 2.19 mg/dL (H)).   Medical History: Past Medical History:  Diagnosis Date  . AICD (automatic cardioverter/defibrillator) present    PACEMAKER  . Anemia   . Cancer ([place])   . Cardiac pacemaker in situ 07/2005   symptomatic bradycardia  . CKD (chronic kidney disease) stage 3, GFR 30-59 ml/min 04/13/2017  . Diverticulosis of colon (without mention of hemorrhage)   . Dysrhythmia   . 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 GERD (gastroesophageal reflux disease)   . GI hemorrhage   . Hyperlipidemia   . Hypertension   . Lower extremity edema   . Neck arthritis, Severe, multi-level 06/09/2013  .  Paroxysmal atrial fibrillation ([name])    s/p failed ablation  . Peptic ulcer, unspecified site, unspecified as acute or chronic, without mention of hemorrhage, perforation, or obstruction 1975  . Personal history of malignant neoplasm of prostate 12/2000   5 weeks radiation, radiation seeds  . Radiation proctitis   . Sinoatrial node dysfunction ([name])   . Unspecified glaucoma(365.9)   . Unspecified hypothyroidism   . Vitamin B12 deficiency     Medications:  Medications Prior to Admission  Medication Sig Dispense Refill Last Dose  . acetaminophen (TYLENOL) 500 MG tablet Take 500 mg by mouth every 6 (six) hours as needed for moderate pain or headache.   prn at prn  . alendronate (FOSAMAX) 70 MG tablet TAKE 1 TABLET BY MOUTH EVERY 7 DAYS WITH A FULL GLASS OF WATER ON AN EMPTY STOMACH (Patient taking differently: Take 70 mg by mouth once a week. ) 12 tablet 3 Past Week at Unknown time  . amiodarone (PACERONE) 200 MG tablet TAKE 1/2 TABLET BY MOUTH DAILY (Patient taking differently: Take 100 mg by mouth at bedtime. ) 45 tablet 2 02/13/2019 at Unknown time  . Cyanocobalamin (VITAMIN B-12 IJ) Inject 1,000 mcg as directed every 30 (thirty) days.    Past Month at Unknown time  . docusate sodium (COLACE) 50 MG capsule Take 50 mg by mouth 2 (two) times daily as needed for mild constipation.  prn at prn  . dorzolamide-timolol (COSOPT) 22.3-6.8 MG/ML ophthalmic solution Place 1 drop into both eyes 2 (two) times daily.     02/14/2019 at Unknown time  . ferrous sulfate 325 (65 FE) MG EC tablet Take 325 mg by mouth daily with breakfast.     02/14/2019 at Unknown time  . furosemide (LASIX) 20 MG tablet Take 2 tablets (40 mg) by mouth once every other day as directed 180 tablet 1 02/14/2019 at Unknown time  . levothyroxine (SYNTHROID) 100 MCG tablet TAKE 1 TABLET BY MOUTH DAILY BEFORE BREAKFAST (Patient taking differently: Take 100 mcg by mouth daily before breakfast. TAKE 1 TABLET BY MOUTH DAILY BEFORE  BREAKFAST) 90 tablet 3 02/14/2019 at Unknown time  . loperamide (IMODIUM) 2 MG capsule Take 2 mg by mouth as needed for diarrhea or loose stools.   prn at prn  . Multiple Vitamin (MULTIVITAMIN) tablet Take 1 tablet by mouth daily.     02/14/2019 at Unknown time  . pantoprazole (PROTONIX) 40 MG tablet TAKE 1 TABLET(40 MG) BY MOUTH DAILY 90 tablet 1 02/14/2019 at Unknown time  . polyethylene glycol (MIRALAX / GLYCOLAX) packet Take 17 g by mouth daily as needed for moderate constipation.    prn at prn  . pravastatin (PRAVACHOL) 40 MG tablet TAKE ONE-HALF TABLET BY MOUTH DAILY (Patient taking differently: Take 20 mg by mouth at bedtime. ) 45 tablet 3 02/13/2019 at Unknown time  . simethicone (MYLICON) 536 MG chewable tablet Chew 125 mg by mouth every 6 (six) hours as needed for flatulence.   prn at prn  . tamsulosin (FLOMAX) 0.4 MG CAPS capsule TAKE 1 CAPSULE(0.4 MG) BY MOUTH DAILY 90 capsule 0 02/14/2019 at Unknown time  . warfarin (COUMADIN) 1 MG tablet TAKE 1 AND 1/2 TABLETS BY MOUTH DAILY AS DIRECTED 45 tablet 1 02/14/2019 at Unknown time  . Wheat Dextrin (BENEFIBER DRINK MIX PO) Take 1 scoop by mouth daily.    Past Week at Unknown time   Scheduled:  . acetaminophen  1,000 mg Oral TID  . amiodarone  100 mg Oral QHS  . Chlorhexidine Gluconate Cloth  6 each Topical Daily  . docusate sodium  100 mg Oral BID  . dorzolamide-timolol  1 drop Both Eyes BID  . ferrous sulfate  325 mg Oral Q breakfast  . furosemide  40 mg Oral QODAY  . levothyroxine  100 mcg Oral Q0600  . multivitamin with minerals  1 tablet Oral Daily  . pantoprazole  40 mg Oral Daily  . pravastatin  20 mg Oral QHS  . tamsulosin  0.4 mg Oral QPC supper  . Warfarin - Pharmacist Dosing Inpatient   Does not apply q1800   Infusions:  . methocarbamol (ROBAXIN) IV       Assessment: Pharmacy has been consulted to restart Warfarin on 85yo patient with paroxysmal Atrial Fibrillation post right anterior hip hemiarthroplasty.    Patient's home dose regimen is Warfarin 1mg  MWF and 1.5mg  TuThSaSu.   Patient was admitted with INR of 4.4 and last Warfarin dose taken was 11/17. Also takes Amiodarone 100mg  daily, which can cause increased INR, so will monitor closely. Warfarin was held due to surgeon wanting INR less than 1.6 prior to procedure.  Therapy resumed 11/20.   Date      INR     Dose 11/20     1.3      1.5 mg 11/21     1.6      2 mg 11/22  1.9      1.5mg  11/23     2.2  Goal of Therapy:  INR 2-3 Monitor platelets by anticoagulation protocol: Yes   Plan:  Will order Warfarin 1.0 mg PO x 1 to be given this evening per home dosing regimen and current INR therapeutic x 1    Will recheck INR with AM labs tomorrow morning.  Lu Duffel, PharmD, BCPS Clinical Pharmacist 02/20/2019 8:39 AM

## 2019-02-21 DIAGNOSIS — M8000XD Age-related osteoporosis with current pathological fracture, unspecified site, subsequent encounter for fracture with routine healing: Secondary | ICD-10-CM | POA: Insufficient documentation

## 2019-02-21 DIAGNOSIS — N184 Chronic kidney disease, stage 4 (severe): Secondary | ICD-10-CM | POA: Insufficient documentation

## 2019-02-21 DIAGNOSIS — E43 Unspecified severe protein-calorie malnutrition: Secondary | ICD-10-CM | POA: Insufficient documentation

## 2019-02-22 DIAGNOSIS — S72041D Displaced fracture of base of neck of right femur, subsequent encounter for closed fracture with routine healing: Secondary | ICD-10-CM | POA: Diagnosis not present

## 2019-02-22 DIAGNOSIS — I480 Paroxysmal atrial fibrillation: Secondary | ICD-10-CM | POA: Diagnosis not present

## 2019-02-22 DIAGNOSIS — N184 Chronic kidney disease, stage 4 (severe): Secondary | ICD-10-CM | POA: Diagnosis not present

## 2019-02-22 DIAGNOSIS — E43 Unspecified severe protein-calorie malnutrition: Secondary | ICD-10-CM | POA: Diagnosis not present

## 2019-03-10 ENCOUNTER — Other Ambulatory Visit: Payer: Self-pay

## 2019-03-10 ENCOUNTER — Other Ambulatory Visit: Payer: Self-pay | Admitting: *Deleted

## 2019-03-10 MED ORDER — AMIODARONE HCL 200 MG PO TABS: 100.0000 mg | ORAL_TABLET | Freq: Every day | ORAL | 0 refills | Status: DC

## 2019-03-10 MED ORDER — PRAVASTATIN SODIUM 40 MG PO TABS: 20.0000 mg | ORAL_TABLET | Freq: Every day | ORAL | 0 refills | Status: DC

## 2019-03-10 MED ORDER — PANTOPRAZOLE SODIUM 40 MG PO TBEC: DELAYED_RELEASE_TABLET | ORAL | 0 refills | Status: DC

## 2019-03-10 NOTE — Telephone Encounter (Signed)
*  STAT* If patient is at the pharmacy, call can be transferred to refill team.   1. Which medications need to be refilled? (please list name of each medication and dose if known) warfarin  2. Which pharmacy/location (including street and city if local pharmacy) is medication to be sent to? [place]  3. Do they need a 30 day or 90 day supply? 90  Requested Prescriptions   Signed Prescriptions Disp Refills  . amiodarone (PACERONE) 200 MG tablet 15 tablet 0    Sig: Take 0.5 tablets (100 mg total) by mouth daily. Please call [phone] to schedule appointment for further refills. Thank you!    Authorizing Provider: Deboraha Sprang    Ordering User: Raelene Bott, Morton Simson L

## 2019-03-10 NOTE — Telephone Encounter (Addendum)
Request for Warfarin received; pt is has not had an INR checked since 01/18/2019 and needs an appt.  Called the pt and spoke with dtr-Laurel, she states that the pt has been in the hospital because he broke his hip and then went to rehab and he just he returned home today so that's why he I overdue. Pt is using a walker/cane to assist with ambulation and for long distances she states he requires a wheelchair. Also, she states the pt has enough Warfarin at this time and does not need a refill and she was willing to make an appt for next week. Pt states he has been taking Warfarin 1.5 mgs daily except 1mg  on Monday, Wedenesday, and Friday. Dtr is aware that when the pt needs a refill to let us know or call us. Gave her the number to call and appt set as well.

## 2019-03-13 ENCOUNTER — Other Ambulatory Visit: Payer: Self-pay

## 2019-03-14 ENCOUNTER — Other Ambulatory Visit: Payer: Self-pay | Admitting: *Deleted

## 2019-03-14 DIAGNOSIS — I10 Essential (primary) hypertension: Secondary | ICD-10-CM

## 2019-03-14 MED ORDER — WARFARIN SODIUM 1 MG PO TABS: ORAL_TABLET | ORAL | 1 refills | Status: DC

## 2019-03-14 NOTE — Patient Outreach (Signed)
Member screened for potential [facility] Care Management needs as a benefit of [place] [employer].  Verified in Patient [name] [name] that [name] discharged from [facility] on 03/10/19. Appears he will have [facility] as well.   Will make referral for [facility] for follow up. Per chart [name] had increased risk of readmission with multiple medical co morbidities.    [name], MSN-Ed, RN,BSN [facility] Acute Care Coordinator [phone] [facility]) [phone]  (Toll free office)

## 2019-03-14 NOTE — Telephone Encounter (Signed)
*  STAT* If patient is at the pharmacy, call can be transferred to refill team.   1. Which medications need to be refilled? (please list name of each medication and dose if known) warfarin  2. Which pharmacy/location (including street and city if local pharmacy) is medication to be sent to? [place], [place]   3. Do they need a 30 day or 90 day supply? [place]

## 2019-03-15 ENCOUNTER — Telehealth: Payer: Self-pay

## 2019-03-15 NOTE — Telephone Encounter (Signed)
Cesal PT with [facility] left v/m requesting verbal orders for [facility] PT 1 x a wk for 2 wks and 2 x a wk for 1 wk.Please advise.

## 2019-03-15 NOTE — Telephone Encounter (Signed)
I agree

## 2019-03-15 NOTE — Telephone Encounter (Signed)
Left message for Cesal with [facility] giving verbal orders for [facility] PT 1 x a wk for 2 wks and 2 x a wk for 1 wk per Dr. Lorelei Pont.

## 2019-03-16 ENCOUNTER — Other Ambulatory Visit: Payer: Self-pay

## 2019-03-16 ENCOUNTER — Ambulatory Visit (INDEPENDENT_AMBULATORY_CARE_PROVIDER_SITE_OTHER): Payer: Medicare Other | Admitting: Family Medicine

## 2019-03-16 ENCOUNTER — Other Ambulatory Visit: Payer: Self-pay | Admitting: *Deleted

## 2019-03-16 ENCOUNTER — Encounter: Payer: Self-pay | Admitting: *Deleted

## 2019-03-16 ENCOUNTER — Telehealth: Payer: Self-pay | Admitting: Family Medicine

## 2019-03-16 ENCOUNTER — Encounter: Payer: Self-pay | Admitting: Family Medicine

## 2019-03-16 VITALS — BP 108/68 | HR 62 | Temp 97.7°F | Ht 69.5 in | Wt 128.5 lb

## 2019-03-16 DIAGNOSIS — E43 Unspecified severe protein-calorie malnutrition: Secondary | ICD-10-CM

## 2019-03-16 DIAGNOSIS — E538 Deficiency of other specified B group vitamins: Secondary | ICD-10-CM

## 2019-03-16 DIAGNOSIS — S72001D Fracture of unspecified part of neck of right femur, subsequent encounter for closed fracture with routine healing: Secondary | ICD-10-CM

## 2019-03-16 MED ORDER — CYANOCOBALAMIN 1000 MCG/ML IJ SOLN
1000.0000 ug | Freq: Once | INTRAMUSCULAR | Status: AC
  Administered 2019-03-16: 1000 ug via INTRAMUSCULAR

## 2019-03-16 NOTE — Progress Notes (Addendum)
[name] [name] T. [name] Achord, MD Primary Care and Sports Medicine [facility] at [facility] [place], [address] Phone: [phone]  FAX: [phone]  [name] [name] - 85 y.o. male  MRN [id number]  Date of Birth: [birth year]  Visit Date: 03/16/2019  PCP: Owens Loffler, MD  Referred by: Owens Loffler, MD  Chief Complaint  Patient presents with  . Follow-up    [facility]   This visit occurred during the SARS-CoV-2 public health emergency.  Safety protocols were in place, including screening questions prior to the visit, additional usage of staff PPE, and extensive cleaning of exam room while observing appropriate contact time as indicated for disinfecting solutions.   Subjective:   [name] [name] is a 85 y.o. very pleasant male patient with Body mass index is 18.7 kg/m. who presents with the following:  02/14/2019 admission to [facility] for R femoral neck fracture, hemi arthroplasty (02/16/2019 operative date)  Back home on Friday.  Moving slwer.  He has been staying at [facility] until last Friday.  He is feeling much better than at the time of his initial fracture, but he is using a rolling walker to assist his movement, and this is not been used in the past  His daughter is here with him today to provide additional history.  He has lost some weight, significantly about 6 pounds in the last month.  He does not have much of a drive to eat.  Wt Readings from Last 3 Encounters:  03/16/19 128 lb 8 oz (58.3 kg)  02/15/19 179 lb (81.2 kg)  02/10/19 134 lb (60.8 kg)    Loss of some weight.  Waking up pretty often.  Going to bed or 8-9 PM  [facility].  See note yesterday  Normal and regular BM  [phone] Chrissie Noa - daughter Venida Jarvis out, fax and pick up a copy  Past Medical History, Surgical History, Social History, Family History, Problem List, Medications, and Allergies have been reviewed and  updated if relevant.  Patient Active Problem List   Diagnosis Date Noted  . PACEMAKER, PERMANENT 08/07/2008    Priority: Medium  . AF (paroxysmal atrial fibrillation) ([place]) 02/06/2008    Priority: Medium  . Closed fracture of neck of right femur ([place])   . Longstanding persistent atrial fibrillation ([place])   . Warfarin anticoagulation   . Closed right hip fracture, initial encounter ([place]) 02/14/2019  . Pre-op evaluation   . Benign prostatic hyperplasia   . Glaucoma   . Chronic renal failure, stage 3b 04/13/2017  . DDD (degenerative disc disease), cervical, Severe 06/09/2013  . Neck arthritis, Severe, multi-level 06/09/2013  . Sinoatrial node dysfunction ([name])   . Long term (current) use of anticoagulants 06/24/2010  . IRON DEFICIENCY 04/17/2010  . GLAUCOMA 08/07/2008  . GERD 08/07/2008  . Vitamin B 12 deficiency 07/19/2008  . PEPTIC ULCER DISEASE 07/18/2008  . RADIATION PROCTITIS 07/18/2008  . DIVERTICULOSIS, COLON 07/18/2008  . Prostate cancer, in remission 07/18/2008  . Essential hypertension 05/09/2008  . Hyperlipidemia 02/06/2008  . Hypothyroidism 12/01/2007  . HERN UNS SITE ABD CAV W/O MENTION OBST/GANGREN 12/01/2007    Past Medical History:  Diagnosis Date  . AICD (automatic cardioverter/defibrillator) present    PACEMAKER  . Anemia   . Cancer ([place])   . Cardiac pacemaker in situ 07/2005   symptomatic bradycardia  . CKD (chronic kidney disease) stage 3, GFR 30-59 ml/min 04/13/2017  . Diverticulosis of colon (without mention of  hemorrhage)   . Dysrhythmia   . 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 GERD (gastroesophageal reflux disease)   . GI hemorrhage   . Hyperlipidemia   . Hypertension   . Lower extremity edema   . Neck arthritis, Severe, multi-level 06/09/2013  . Paroxysmal atrial fibrillation ([name])    s/p failed ablation  . Peptic ulcer, unspecified site, unspecified as acute or chronic,  without mention of hemorrhage, perforation, or obstruction 1975  . Personal history of malignant neoplasm of prostate 12/2000   5 weeks radiation, radiation seeds  . Radiation proctitis   . Sinoatrial node dysfunction ([name])   . Unspecified glaucoma(365.9)   . Unspecified hypothyroidism   . Vitamin B12 deficiency     Past Surgical History:  Procedure Laterality Date  . CARDIAC CATHETERIZATION    . CARDIAC ELECTROPHYSIOLOGY [place] AND ABLATION  2001, 2003   failure  . CATARACT EXTRACTION    . CYSTOGRAM N/A 09/21/2018   Procedure: CYSTOGRAM;  Surgeon: Abbie Sons, MD;  Location: [facility] ORS;  Service: Urology;  Laterality: N/A;  . CYSTOSCOPY WITH URETHRAL DILATATION N/A 01/29/2017   Procedure: CYSTOSCOPY WITH URETHRAL DILATATION;  Surgeon: Abbie Sons, MD;  Location: [facility] ORS;  Service: Urology;  Laterality: N/A;  . CYSTOSCOPY WITH URETHRAL DILATATION N/A 09/21/2018   Procedure: CYSTOSCOPY WITH URETHRAL DILATATION;  Surgeon: Abbie Sons, MD;  Location: [facility] ORS;  Service: Urology;  Laterality: N/A;  . EP IMPLANTABLE DEVICE N/A 02/24/2016   Procedure: PPM Generator Changeout;  Surgeon: Deboraha Sprang, MD;  Location: [place] [facility];  Service: Cardiovascular;  Laterality: N/A;  . gastric ulcer surgery    . HERNIA REPAIR  1994, 2001, 2003   s/p drainage and complications, 04/9516, 10/4164 mesh removal  . HIP ARTHROPLASTY Right 02/16/2019   Procedure: RIGHT ANTERIOR HIP HEMIARTHROPLASTY,CEMENTED;  Surgeon: Hessie Knows, MD;  Location: [facility] ORS;  Service: Orthopedics;  Laterality: Right;  . INSERT / REPLACE / REMOVE PACEMAKER  07/2005   symptomatic bradycardia  . kidney stones    . NOSE SURGERY     cancer removed   . PROSTATE SURGERY    . SKIN CANCER DESTRUCTION    . TOOTH EXTRACTION      Social History   Socioeconomic History  . Marital status: Widowed    Spouse name: Not on file  . Number of children: Not on file  . Years of education: Not on file  . Highest  education level: Not on file  Occupational History  . Not on file  Tobacco Use  . Smoking status: Never Smoker  . Smokeless tobacco: Never Used  Substance and Sexual Activity  . Alcohol use: No  . Drug use: No  . Sexual activity: Yes    Birth control/protection: None  Other Topics Concern  . Not on file  Social History Narrative  . Not on file   Social Determinants of Health   Financial Resource Strain:   . Difficulty of Paying Living Expenses: Not on file  Food Insecurity:   . Worried About Charity fundraiser in the Last Year: Not on file  . Ran Out of Food in the Last Year: Not on file  Transportation Needs:   . Lack of Transportation (Medical): Not on file  . Lack of Transportation (Non-Medical): Not on file  Physical Activity:   . Days of Exercise per Week: Not on file  . Minutes  of Exercise per Session: Not on file  Stress:   . Feeling of Stress : Not on file  Social Connections:   . Frequency of Communication with Friends and Family: Not on file  . Frequency of Social Gatherings with Friends and Family: Not on file  . Attends Religious Services: Not on file  . Active Member of Clubs or Organizations: Not on file  . Attends Archivist Meetings: Not on file  . Marital Status: Not on file  Intimate Partner Violence:   . Fear of Current or Ex-Partner: Not on file  . Emotionally Abused: Not on file  . Physically Abused: Not on file  . Sexually Abused: Not on file    Family History  Problem Relation Age of Onset  . Breast cancer Mother   . Bone cancer Father   . Alcohol abuse Other   . Coronary artery disease Other   . Dementia Other   . Diabetes Other     Allergies  Allergen Reactions  . Penicillins Rash and Other (See Comments)    Has patient had a PCN reaction causing immediate rash, facial/tongue/throat swelling, SOB or lightheadedness with hypotension: {no Has patient had a PCN reaction causing severe rash involving mucus membranes or skin  necrosis: {no Has patient had a PCN reaction that required hospitalization {no Has patient had a PCN reaction occurring within the last 10 years: {no If all of the above answers are "NO", then may proceed with Cephalosporin use.    Medication list reviewed and updated in full in [place].  GEN: No fevers, chills. Nontoxic. Primarily MSK c/o today. MSK: Detailed in the HPI GI: tolerating PO intake without difficulty Neuro: No numbness, parasthesias, or tingling associated. Otherwise the pertinent positives of the ROS are noted above.   Objective:   BP 108/68   Pulse 62   Temp 97.7 F (36.5 C) (Temporal)   Ht 5' 9.5" (1.765 m)   Wt 128 lb 8 oz (58.3 kg)   SpO2 100%   BMI 18.70 kg/m    GEN: WDWN, NAD, Non-toxic, Alert & Oriented x 3 HEENT: Atraumatic, Normocephalic.  Ears and Nose: No external deformity. EXTR: No clubbing/cyanosis/edema NEURO: Normal gait, antalgic PSYCH: Normally interactive. Conversant. Not depressed or anxious appearing.  Calm demeanor.    In the groin he has a mildly pinkish irritating appearing rash.  He has been using some barrier cream to the area, but it has persisted.  Cardiovascular, irregularly irregular, no appreciable murmurs.   Radiology:  Lipids: Lab Results  Component Value Date   CHOL 132 04/08/2018   Lab Results  Component Value Date   HDL 68.50 04/08/2018   Lab Results  Component Value Date   LDLCALC 53 04/08/2018   Lab Results  Component Value Date   TRIG 50.0 04/08/2018   Lab Results  Component Value Date   CHOLHDL 2 04/08/2018   CBC: CBC Latest Ref Rng & Units 02/19/2019 02/18/2019 02/17/2019  WBC 4.0 - 10.5 K/uL 8.2 9.6 9.7  Hemoglobin 13.0 - 17.0 g/dL 8.5(L) 8.1(L) 8.8(L)  Hematocrit 39.0 - 52.0 % 27.0(L) 26.0(L) 26.9(L)  Platelets 150 - 400 K/uL 191 159 144(L)    Basic Metabolic Panel:    Component Value Date/Time   NA 137 02/19/2019 0430   K 4.7 02/19/2019 0430   CL 109 02/19/2019 0430   CO2 21  (L) 02/19/2019 0430   BUN 50 (H) 02/19/2019 0430   CREATININE 2.19 (H) 02/19/2019 0430   GLUCOSE 104 (  H) 02/19/2019 0430   CALCIUM 8.6 (L) 02/19/2019 0430   Hepatic Function Latest Ref Rng & Units 02/19/2019 04/08/2018 10/08/2017  Total Protein 6.5 - 8.1 g/dL 5.6(L) 6.4 5.8(L)  Albumin 3.5 - 5.0 g/dL 2.6(L) 3.6 3.3(L)  AST 15 - 41 U/L 19 25 24   ALT 0 - 44 U/L 6 21 18   Alk Phosphatase 38 - 126 U/L 73 96 77  Total Bilirubin 0.3 - 1.2 mg/dL 0.8 0.8 0.7  Bilirubin, Direct 0.0 - 0.3 mg/dL - 0.2 -    Lab Results  Component Value Date   HGBA1C 6.2 07/20/2008   Lab Results  Component Value Date   TSH 0.40 04/08/2018   No results found for: PSA1, PSA   Assessment and Plan:     ICD-10-CM   1. Severe protein-calorie malnutrition ([place])  E43   2. Vitamin B 12 deficiency  E53.8 cyanocobalamin ((VITAMIN B-12)) injection 1,000 mcg  3. Closed fracture of neck of right femur with routine healing, subsequent encounter  S72.001D    I am primarily worried about his nutritional status, and I discussed this with him and his daughter.  Eat as much as he can, and I am to have him do some boost or Ensure twice daily as well.  Follow-up: No follow-ups on file.  Meds ordered this encounter  Medications  . cyanocobalamin ((VITAMIN B-12)) injection 1,000 mcg   No orders of the defined types were placed in this encounter.   Signed,  [name]. Marque Rademaker, MD   Outpatient Encounter Medications as of 03/16/2019  Medication Sig  . acetaminophen (TYLENOL) 500 MG tablet Take 500 mg by mouth every 6 (six) hours as needed for moderate pain or headache.  . alendronate (FOSAMAX) 70 MG tablet TAKE 1 TABLET BY MOUTH EVERY 7 DAYS WITH A FULL GLASS OF WATER ON AN EMPTY STOMACH  . amiodarone (PACERONE) 200 MG tablet Take 0.5 tablets (100 mg total) by mouth daily. Please call [phone] to schedule appointment for further refills. Thank you!  . Cyanocobalamin (VITAMIN B-12 IJ) Inject 1,000 mcg as directed  every 30 (thirty) days.   Marland Kitchen docusate sodium (COLACE) 50 MG capsule Take 50 mg by mouth 2 (two) times daily as needed for mild constipation.  . dorzolamide-timolol (COSOPT) 22.3-6.8 MG/ML ophthalmic solution Place 1 drop into both eyes 2 (two) times daily.    . ferrous sulfate 325 (65 FE) MG EC tablet Take 325 mg by mouth daily with breakfast.    . furosemide (LASIX) 20 MG tablet Take 2 tablets (40 mg) by mouth once every other day as directed  . levothyroxine (SYNTHROID) 100 MCG tablet TAKE 1 TABLET BY MOUTH DAILY BEFORE BREAKFAST  . loperamide (IMODIUM) 2 MG capsule Take 2 mg by mouth as needed for diarrhea or loose stools.  . Multiple Vitamin (MULTIVITAMIN) tablet Take 1 tablet by mouth daily.    . pantoprazole (PROTONIX) 40 MG tablet TAKE 1 TABLET(40 MG) BY MOUTH DAILY  . polyethylene glycol (MIRALAX / GLYCOLAX) packet Take 17 g by mouth daily as needed for moderate constipation.   . pravastatin (PRAVACHOL) 40 MG tablet Take 0.5 tablets (20 mg total) by mouth at bedtime.  . simethicone (MYLICON) 749 MG chewable tablet Chew 125 mg by mouth every 6 (six) hours as needed for flatulence.  . tamsulosin (FLOMAX) 0.4 MG CAPS capsule TAKE 1 CAPSULE(0.4 MG) BY MOUTH DAILY  . warfarin (COUMADIN) 1 MG tablet TAKE 1 AND 1/2 TABLETS BY MOUTH DAILY AS DIRECTED  . Wheat Dextrin (BENEFIBER  DRINK MIX PO) Take 1 scoop by mouth daily.   . [DISCONTINUED] oxyCODONE (OXY IR/ROXICODONE) 5 MG immediate release tablet Take 1 tablet (5 mg total) by mouth every 4 (four) hours as needed for moderate pain.  . [EXPIRED] cyanocobalamin ((VITAMIN B-12)) injection 1,000 mcg    No facility-administered encounter medications on file as of 03/16/2019.

## 2019-03-16 NOTE — Patient Outreach (Signed)
[facility]) Care Management [facility] CM Telephone Outreach, Transition of Care day # 1 Post- SNF discharge day # 6 03/16/2019  KOVEN BELINSKY 06/22/1922 [phone]  Successful telephone outreach to Ronald Lobo, 85 y/o male referred to [place] 03/14/2019 by [facility] RN PAC for transition of care after patient experienced recent hospitalization November 17-23, 2020 for mechanical fall at home with (R) hip fracture; patient had (R) hip arthroplasty and was discharged to SNF for short- term rehabilitation.  Patient was subsequently discharged from [facility] 03/10/2019 to home/ self-care with home health services in place through [place] care.  Patient has history including, but not limited to, HTN/ HLD; A-fib, on ACT; previous pacemaker insertion; GERD; DDD; CKD- stage IV; and BPH.  HIPAA/ identity verified with patient during phone call today and [facility] CM services were discussed with patient and his daughter/ caregiver [name]; patient provides verbal consent to speak with laurel at any time.  Laurel lives with patient, and both she and patient provide consent for [facility] CM involvement in patient's care post- SNF discharge.  Today, patient/ caregiver report that patient is "doing great," since his discharge from the SNF rehabilitation facility.  Patient denies pain and new/ recent falls and sounds to be in no distress throughout entirety of phone call today.  Patient/ caregiver further report:  Medications: -- Has all medicationsand takes as prescribed;denies questions/ concerns around current medications.  -- patient self-manage medications  -- denies issues with swallowing medications -- patient declines medication review today, stating that medications were reviewed during PCP office visit earlier today; he and caregiver agree to medication review with next week's call  Home health [facility]) services: -- Home Health services for PT active through [facility] care; PT visited  with patient this week to begin therapy; patient reports "went well;" and endorses active participation with PT team- this was encouraged and benefits of PT post- hir fracture/ surgery was discussed with patient -- home health RN not active; certification assessment indicated patient did not have ongoing nursing care needs -- confirmed that patient and caregiver have contact information for home health team  Provider appointments: -- Attended post- SNF discharge PCP office visit today; daughter attended with patient; both deny post-visit questions -- All upcoming provider appointments were reviewed with patient/caregiver today and both report plans to attend all as scheduled  Safety/ Mobility/ Falls: -- denies new/ recent falls post- SNF discharge; reports uses walker "all the time;" admits to 2 falls over last 12 months; states most recent fall resulted from 'accidentally tripping over" his own feet as he went to get coffee in morning -- assistive devices: walker -- general fall risks/ prevention education discussed with patient and caregiver today  Social/ Community Resource needs: -- currently denies community resource needs, stating supportive family members that assist with care needs as indicated; although he is essentially independent, patient appreciates that his daughter lives with him and is available to assist as/ if needed -- family provides transportation for patient to all provider appointments, errands, etc -- SDOH completed for: depression; transportation; food insecurity- no concerns identified  Advanced Directive (AD) Planning:   --reports does currently have exisisting Advanced Directives in place for HCPOA and Living Will; denies desire to make changes -- endorses that he is a DNR/ No Code and confirms that he has DNR form posted in full view on his refrigerator  Patient/ caregiver both deny further issues, concerns, or problems today.  I provided/ confirmed that patient has  my direct phone number, the main [facility] CM office phone number, and the [facility] CM 24-hour nurse advice phone number should issues arise prior to next scheduled [place] outreach next week.  Encouraged patient/ caregiver to contact me directly if needs, questions, issues, or concerns arise prior to next scheduled outreach; patient agreed to do so.  Plan:  Patient will take medications as prescribed and will attend all scheduled provider appointments  Patient will promptly notify care providers for any new concerns/ issues/ problems that arise  Patient will actively participate in home health services for PT as ordered post-hospital discharge  Patient will continue using assistive devices for ambulation   I will make patient's PCP aware of [facility] RN CM involvement in patient's care-- will send barriers letter  [place] outreach for ongoing transition of care to continue with scheduled phone call next week  [facility] CM Care Plan Problem One     Most Recent Value  Care Plan Problem One  High risk for hospital readmission related to/ as evidenced by recent hospitalization for mechanical fall with (R) hip fracture and subsequent SNF rehabilitaion visit  Role Documenting the Problem One  Care Management Coordinator  Care Plan for Problem One  Active  [facility] Long Term Goal   Over the next 31 days, patient will not experience hospital re-admission as evidenced by patient/ caregiver reporting and review of EMR during [facility] RN CM outreach  [facility] Long Term Goal Start Date  03/16/19  Interventions for Problem One Long Term Goal  Engaged patient in [facility] CM services and initiated [facility] program,  confirmed that patient and caregiver do not have current clinical or medication concerns,  discussed with patient and caregiver need for prompt medication review and encouraged patient to have medications ready for review at time of next scheduled outreach  [facility] CM Short Term Goal #1   Over the next 30 days,  patient will actively participate in home health PT services as ordered post- SNF discharge, as evidenced by patient reporting and collaboration with home health team as indicated during [facility] RN CM outreach  [facility] CM Short Term Goal #1 Start Date  03/16/19  Interventions for Short Term Goal #1  Confirmed that patient has been contacted and visited by home health RN and PT,  confirrmed that patient and caregiver have contact information for home health team, and discussed benefits of home health PT services,  encouraged patient's active participation in home health PT services  [facility] CM Short Term Goal #2   Over the next 30 days, patient will attend all scheduled provider appointments as evidenced by patient and caregiver reporting and collaboration with care providers as indicated during [facility] RN CM outreach  [facility] CM Short Term Goal #2 Start Date  03/16/19  Interventions for Short Term Goal #2  Confirmed that patient attended post- SNF discharge PCP office visit this morning,  reviewed PCP office visit with patient and caregiver and confirmed that they do not have questions,  reviewed upcoming scheduled provider appointments with caregiver who confirms that she will continue providing transportation and attending with patient     I appreciate the opportunity to participate in [place] care,  Oneta Rack, RN, BSN, [employer] Coordinator [facility] Care Management  [phone]

## 2019-03-16 NOTE — Patient Instructions (Signed)
To add to calories:  Add Ensure or Boost - add in twice

## 2019-03-16 NOTE — Telephone Encounter (Signed)
fmla papework in dr copland's in box for review and signature

## 2019-03-17 ENCOUNTER — Telehealth: Payer: Self-pay

## 2019-03-17 NOTE — Telephone Encounter (Signed)
Jennifer-occupational therapist from [facility] called to let us know patient declined O.T services. [name] [name] states after speaking with patient and his daughter and physical therapist it does not sound that patient needs O.T. services, he is pretty independent. Jennifer's CB if needed is [phone].  This is a FYI note.

## 2019-03-22 ENCOUNTER — Other Ambulatory Visit: Payer: Self-pay | Admitting: *Deleted

## 2019-03-22 ENCOUNTER — Encounter: Payer: Self-pay | Admitting: *Deleted

## 2019-03-22 NOTE — Patient Outreach (Signed)
[facility]) Care Management [place], Transition of Care day # 7 Post- SNF discharge day # 12  03/22/2019  [name] [name] [birth year] [id number]  Successful telephone outreach to [name] [name], 85 y/o male referred to [place] 03/14/2019 by [facility] RN PAC for transition of care after patient experienced recent hospitalization November 17-23, 2020 for mechanical fall at home with (R) hip fracture; patient had (R) hip arthroplasty and was discharged to SNF for short- term rehabilitation.  Patient was subsequently discharged from [facility] 03/10/2019 to home/ self-care with home health services in place through [place] care.  Patient has history including, but not limited to, HTN/ HLD; A-fib, on ACT; previous pacemaker insertion; GERD; DDD; CKD- stage IV; and BPH.  HIPAA/ identity verified with patient during phone call today and our conversation included patient and his daughter/ caregiver [name] [name], with phone on speaker mode.  Today, patient/ caregiver report that patient is "doing just fine," and both deny current clinical concerns; patient denies pain and new/ recent falls and sounds to be in no distress throughout entirety of phone call today.  Continues using walker at home for all ambulation  Patient/ caregiver further report:  Medications: -- Has all medicationsand takes as prescribed;denies questions/ concerns around current medications.  -- patient continues self-managing medications  -- Patient was recently discharged from SNF and all medications were thoroughly reviewed with patient/ caregiver; no concerns/ discrepancies noted  Home health [facility]) services: -- [place] services for PT remain active through [facility] care; PT visited with patient this week and caregiver reports services may not last much longer as patient is 'doing so well."  Reports patient performs exercises independently on days that PT does not come to home- positive  reinforcement provided  Provider appointments: -- No recent provider appointments since last outreach -- All upcoming provider appointments were reviewed with patient/caregiver today and both report plans to attend all as scheduled  Patient/ caregiver both deny further issues, concerns, or problems today. I confirmed that patient hasmy direct phone number, the main [facility] CM office phone number, and the [facility] CM 24-hour nurse advice phone number should issues arise prior to next scheduled [place] outreach next week.  Encouraged patient/ caregiver to contact me directly if needs, questions, issues, or concerns arise prior to next scheduled outreach; patient agreed to do so.  Plan:  Patient will take medications as prescribed and will attend all scheduled provider appointments  Patient will promptly notify care providers for any new concerns/ issues/ problems that arise  Patient will actively participate in home health services for PT as ordered post-hospital discharge  Patient will continue using assistive devices for ambulation   [facility] CM outreach for ongoing transition of care to continue with scheduled phone call next week  [facility] CM Care Plan Problem One     Most Recent Value  Care Plan Problem One  High risk for hospital readmission related to/ as evidenced by recent hospitalization for mechanical fall with (R) hip fracture and subsequent SNF rehabilitaion visit  Role Documenting the Problem One  Care Management Coordinator  Care Plan for Problem One  Active  [facility] Long Term Goal   Over the next 31 days, patient will not experience hospital re-admission as evidenced by patient/ caregiver reporting and review of EMR during [facility] RN CM outreach  [facility] Long Term Goal Start Date  03/16/19  Interventions for Problem One Long Term Goal  Discussed with patient and caregiver patient's  current clinical condition,  confirmed that neither have any current clinical concerns,  reviewed  medications with patient's caregiver post- SNF discharge and confirmed that patient/ caregiver has no current concerns aorund medications  [facility] CM Short Term Goal #1   Over the next 30 days, patient will actively participate in home health PT services as ordered post- SNF discharge, as evidenced by patient reporting and collaboration with home health team as indicated during [facility] RN CM outreach  [facility] CM Short Term Goal #1 Start Date  03/16/19  Interventions for Short Term Goal #1  Confirmed that home health services for PT continue and that patient is actively participating in PT and performing exercises independently between PT home visits,  positive reinforcement provided,  reviewed with patient and caregiver recent home health visits and encouraged patient's ongoing active participation  [facility] CM Short Term Goal #2   Over the next 30 days, patient will attend all scheduled provider appointments as evidenced by patient and caregiver reporting and collaboration with care providers as indicated during [facility] RN CM outreach  [facility] CM Short Term Goal #2 Start Date  03/16/19  Interventions for Short Term Goal #2  Reviewed with caregiver and patient upcoming provider appointments and confirmed that caregiver/ daughter will continue to provide transportation and attend with patient     Oneta Rack, RN, BSN, [place] Coordinator [facility] Care Management  [phone]

## 2019-03-28 ENCOUNTER — Encounter: Payer: Self-pay | Admitting: *Deleted

## 2019-03-28 ENCOUNTER — Other Ambulatory Visit: Payer: Self-pay | Admitting: *Deleted

## 2019-03-28 NOTE — Patient Outreach (Signed)
[facility]) Care Management [place], Transition of Care day # 13 Post- SNF discharge day # 18 without hospital re-admission Unsuccessful (consecutive) outreach attempt # 1- previously engaged patient/ caregiver  03/28/2019  [name] [name] 29-Jun-1922 [id number]  Unsuccessful telephone outreach toWillard Laurance [name], 85 y/o male referred to [place] 03/14/2019 by [facility] RN PACfor transition of careafter patient experienced recent hospitalization November 17-23, 2020 for mechanical fall at home with (R) hip fracture; patient had (R) hip arthroplasty and was discharged to SNF for short- term rehabilitation. Patient was subsequently discharged from [facility] 12/11/2020to home/ self-care with home health services in place through [place] care.Patient has history including, but not limited to, HTN/ HLD; A-fib, on ACT; previous pacemaker insertion; GERD; DDD; CKD- stage IV; and BPH.  HIPAA compliant voice mail message left for patient, requesting return call back.  1:35 pm:  Patient and caregiver/ daughter returned my call from earlier today; HIPAA/ identity verified with patient during phone call and HAPPY BIRTHDAY wishes were extended.  Patient and caregiver both deny current clinical concerns around patient's condition and he denies pain, new/ recent falls, and reports doing "overall very well."  Reports that he woke up this morning with diarrhea and has taken immodium; denies nausea and decreased appetite; stated that he recently took miralax to help with constipation, and believes this is what led to diarrhea; patient states, "this just sometimes happens to me; I'm not really concerned about it."  Advised patient to report to his PCP if diarrhea does not promptly resolve, or if he develops new symptoms, and he verbalizes understanding and agreement.  Patient sounds to be in no distress throughout phone call today and he denies pain and new/ recent falls.   Reports that he continues using walker at home for all ambulation  Patient/ caregiver further report:  -- no concerns/ issues/ problems re: medications; patient continues self-managing and reports adherence to established medication regimen and denies recent changes -- home health services for PT continue: reports that he expects PT visit scheduled tomorrow to be "the last one;" states that he has continued completing exercises learned during PT sessions in between home visits; this was encouraged; patient believes he has made good progress with home health PT and states that he eventually hopes to be able to resume walking without use of assistive devices; however, patient reports that he doesn't feel ready 'any time soon;" and he verbalizes plans to discuss with his PCP prior to doing so-- so he has continued using walker for all ambulation "for now."  Fall risks/ prevention education was reiterated with patient today -- no recent provider office visits; confirms that daughter/ caregiver will continue providing transportation to all scheduled appointments and she confirms that she is aware of all and plans to attend all with patient  Patient/ caregiver bothdenyfurther issues, concerns, or problems today. Iconfirmed that both havemy direct phone number, the main [facility] CM office phone number, and the [facility] CM 24-hour nurse advice phone number should issues arise prior to next scheduled [facility] Community CM outreachnext week for ongoing transition of care. Encouraged patient/ caregiverto contact me directly if needs, questions, issues, or concerns arise prior to next scheduled outreach; patient agreed to do so.  Plan:  Patient will take medications as prescribed and will attend all scheduled provider appointments  Patient will promptly notify care providers for any new concerns/ issues/ problems that arise  Patient will actively participate in home health servicesfor PTas ordered post-SNF  discharge  Patient will continueusing assistive devices for ambulation  Patient will continue independently performing PT exercises learned during home health sessions  I will share today's [facility] CM noteand care plan to patient's PCP as [facility] CM initial assessment  [facility] Community CM outreachfor ongoing transition of careto continue with scheduled phone call next week  [facility] CM Care Plan Problem One     Most Recent Value  Care Plan Problem One  High risk for hospital readmission related to/ as evidenced by recent hospitalization for mechanical fall with (R) hip fracture and subsequent SNF rehabilitaion visit  Role Documenting the Problem One  Care Management Coordinator  Care Plan for Problem One  Active  THN Long Term Goal   Over the next 31 days, patient will not experience hospital re-admission as evidenced by patient/ caregiver reporting and review of EMR during [facility] RN CM outreach  [facility] Long Term Goal Start Date  03/16/19  Interventions for Problem One Long Term Goal  Discussed current clinical conditin with patient's caregiver/ daughter and with patient and confirmed that neither have any current clinical concerns,  confirmed that patient has experienced no recent falls and continues using walker for ambulation,  previously provided education around fall risks/ prevention was reiterated with patient today using teach back method  [facility] CM Short Term Goal #1   Over the next 30 days, patient will actively participate in home health PT services as ordered post- SNF discharge, as evidenced by patient reporting and collaboration with home health team as indicated during [facility] RN CM outreach  [facility] CM Short Term Goal #1 Start Date  03/16/19  Interventions for Short Term Goal #1  Discussed recent home health PT visits,  confirmed that patient expects to be released from home health PT services tomorrow,  confirmed that he continues performing exercises learned during home health PT sessions, in between  home health visits-- positive reinforcement provided.  Discussed value/ benefits of continuing PT exercises once home health team has siigned off and encouraged patient to continue doing exercises independently once home health team signs off  [facility] CM Short Term Goal #2   Over the next 30 days, patient will attend all scheduled provider appointments as evidenced by patient and caregiver reporting and collaboration with care providers as indicated during [facility] RN CM outreach  [facility] CM Short Term Goal #2 Start Date  03/16/19  Interventions for Short Term Goal #2  Confirmed that patient has had no recent scheduled appointments and that daughter continues providing transportation for all appointments/ errands,  confirmed that caregiver has all scheduled appointments on calendar with plans to attend all as scheduled     Oneta Rack, RN, BSN, [place] Care Management  [phone]

## 2019-03-28 NOTE — Telephone Encounter (Signed)
Patient's Daughter called back about FMLA paperwork  Spoke with Shirlean Mylar. She has put this in Dr Rometta Emery box to see if she is able to fill the paperwork out since Dr Lorelei Pont is out of office this week

## 2019-03-28 NOTE — Telephone Encounter (Signed)
Error

## 2019-03-29 NOTE — Telephone Encounter (Signed)
Noted  

## 2019-04-03 ENCOUNTER — Ambulatory Visit (INDEPENDENT_AMBULATORY_CARE_PROVIDER_SITE_OTHER): Payer: Medicare Other

## 2019-04-03 ENCOUNTER — Other Ambulatory Visit: Payer: Self-pay

## 2019-04-03 DIAGNOSIS — S72041D Displaced fracture of base of neck of right femur, subsequent encounter for closed fracture with routine healing: Secondary | ICD-10-CM | POA: Diagnosis not present

## 2019-04-03 DIAGNOSIS — Z7901 Long term (current) use of anticoagulants: Secondary | ICD-10-CM

## 2019-04-03 DIAGNOSIS — M1990 Unspecified osteoarthritis, unspecified site: Secondary | ICD-10-CM

## 2019-04-03 DIAGNOSIS — E039 Hypothyroidism, unspecified: Secondary | ICD-10-CM

## 2019-04-03 DIAGNOSIS — I495 Sick sinus syndrome: Secondary | ICD-10-CM

## 2019-04-03 DIAGNOSIS — Z5181 Encounter for therapeutic drug level monitoring: Secondary | ICD-10-CM

## 2019-04-03 DIAGNOSIS — D649 Anemia, unspecified: Secondary | ICD-10-CM

## 2019-04-03 DIAGNOSIS — N400 Enlarged prostate without lower urinary tract symptoms: Secondary | ICD-10-CM

## 2019-04-03 DIAGNOSIS — Z96641 Presence of right artificial hip joint: Secondary | ICD-10-CM | POA: Diagnosis not present

## 2019-04-03 DIAGNOSIS — I129 Hypertensive chronic kidney disease with stage 1 through stage 4 chronic kidney disease, or unspecified chronic kidney disease: Secondary | ICD-10-CM | POA: Diagnosis not present

## 2019-04-03 DIAGNOSIS — I480 Paroxysmal atrial fibrillation: Secondary | ICD-10-CM | POA: Diagnosis not present

## 2019-04-03 DIAGNOSIS — Z8546 Personal history of malignant neoplasm of prostate: Secondary | ICD-10-CM

## 2019-04-03 DIAGNOSIS — N1832 Chronic kidney disease, stage 3b: Secondary | ICD-10-CM

## 2019-04-03 DIAGNOSIS — I4811 Longstanding persistent atrial fibrillation: Secondary | ICD-10-CM

## 2019-04-03 DIAGNOSIS — Z950 Presence of cardiac pacemaker: Secondary | ICD-10-CM

## 2019-04-03 DIAGNOSIS — W19XXXD Unspecified fall, subsequent encounter: Secondary | ICD-10-CM | POA: Diagnosis not present

## 2019-04-03 DIAGNOSIS — H409 Unspecified glaucoma: Secondary | ICD-10-CM

## 2019-04-03 DIAGNOSIS — Z85840 Personal history of malignant neoplasm of eye: Secondary | ICD-10-CM

## 2019-04-03 DIAGNOSIS — E538 Deficiency of other specified B group vitamins: Secondary | ICD-10-CM

## 2019-04-03 LAB — POCT INR: INR: 1.9 — AB (ref 2.0–3.0)

## 2019-04-03 NOTE — Patient Instructions (Signed)
Please take 2 tablets tonight, then continue dosage of 1.5 tablets every day EXCEPT 1 tablet on MONDAYS, [place]. Recheck in 4 weeks.

## 2019-04-03 NOTE — Telephone Encounter (Signed)
They are ready. No charge.   I am very sorry it took a long to complete - I was in [place] last week and on a Covid quarantine before Christmas.

## 2019-04-04 NOTE — Telephone Encounter (Signed)
Paperwork faxed °

## 2019-04-04 NOTE — Telephone Encounter (Signed)
Daughter aware.

## 2019-04-05 ENCOUNTER — Other Ambulatory Visit: Payer: Self-pay | Admitting: Family Medicine

## 2019-04-05 DIAGNOSIS — E559 Vitamin D deficiency, unspecified: Secondary | ICD-10-CM

## 2019-04-05 DIAGNOSIS — E538 Deficiency of other specified B group vitamins: Secondary | ICD-10-CM

## 2019-04-05 DIAGNOSIS — N1832 Chronic kidney disease, stage 3b: Secondary | ICD-10-CM

## 2019-04-05 DIAGNOSIS — E43 Unspecified severe protein-calorie malnutrition: Secondary | ICD-10-CM

## 2019-04-05 DIAGNOSIS — D500 Iron deficiency anemia secondary to blood loss (chronic): Secondary | ICD-10-CM

## 2019-04-06 ENCOUNTER — Other Ambulatory Visit: Payer: Self-pay | Admitting: *Deleted

## 2019-04-06 ENCOUNTER — Encounter: Payer: Self-pay | Admitting: *Deleted

## 2019-04-06 NOTE — Telephone Encounter (Signed)
Copy for pt °Copy for scan °

## 2019-04-06 NOTE — Patient Outreach (Signed)
[facility]) Care Management [place], Transition of Care day # 22 Post- SNF discharge day # 27 Unsuccessful (consecutive) outreach attempt # 1- previously engaged patient/ caregiver 04/06/2019  [name] [birth year] [id number]  1:00 pm:  Unsuccessful telephone outreach toWillard Laurance Flatten, 86 y/o male referred to [place] 03/14/2019 by [facility] RN PACfor transition of careafter patient experienced recent hospitalization November 17-23, 2020 for mechanical fall at home with (R) hip fracture; patient had (R) hip arthroplasty and was discharged to SNF for short- term rehabilitation. Patient was subsequently discharged from [facility] 12/11/2020to home/ self-care with home health services in place through [place] care.Patient has history including, but not limited to, HTN/ HLD; A-fib, on ACT; previous pacemaker insertion; GERD; DDD; CKD- stage IV; and BPH.  HIPAA compliant voice mail message left for patient, requesting return call back.  Plan:  Will re-attempt [place] telephone outreach within 4 business days if I do not hear back from patient or caregiver first.  Oneta Rack, RN, BSN, [place] Coordinator [facility] Care Management  [phone]

## 2019-04-10 ENCOUNTER — Encounter: Payer: Self-pay | Admitting: *Deleted

## 2019-04-10 ENCOUNTER — Other Ambulatory Visit: Payer: Self-pay | Admitting: *Deleted

## 2019-04-10 DIAGNOSIS — S72041D Displaced fracture of base of neck of right femur, subsequent encounter for closed fracture with routine healing: Secondary | ICD-10-CM | POA: Diagnosis not present

## 2019-04-10 NOTE — Patient Outreach (Signed)
[name]) Care Management [name] [name], Transition of Care day # 26 Post- SNF discharge day # 31  04/10/2019  [name] [name] 1922-12-10 [id number]  Successful telephone outreach toWillard Laurance [name], 86 y/o male referred to [place] 03/14/2019 by [facility] RN PACfor transition of careafter patient experienced recent hospitalization November 17-23, 2020 for mechanical fall at home with (R) hip fracture; patient had (R) hip arthroplasty and was discharged to SNF for short- term rehabilitation. Patient was subsequently discharged from [facility] 12/11/2020to home/ self-care with home health services in place through [place] care.Patient has history including, but not limited to, HTN/ HLD; A-fib, on ACT; previous pacemaker insertion; GERD; DDD; CKD- stage IV; and BPH.  HIPAA/ identity verified; patient reports doing well and denies concerns/ issues/ pain/ new- recent falls; reports home health services have ended and that he continues using walker for ambulation.  Patient denies issues/ recent changes around medications.  Reports that daughter/ caregiver will provide transportation to upcoming scheduled PCP appointment on 04/17/2019, along with plans to attend.  Patient deniesfurther issues, concerns, or problems today. Iconfirmed that both he/ caregiver havemy direct phone number, the main [facility] CM office phone number, and the [facility] CM 24-hour nurse advice phone number should issues arise prior to next scheduled [facility] Community CM outreachnext week for ongoing transition of care. Encouraged patient/ caregiverto contact me directly if needs, questions, issues, or concerns arise prior to next scheduled outreach; patient agreed to do so.  Plan:  Patient will take medications as prescribed and will attend all scheduled provider appointments  Patient will promptly notify care providers for any new concerns/ issues/ problems that arise  Patient will  continueusing assistive devices for ambulation  Patient will continue independently performing PT exercises learned during home health sessions  I will place printed educational material in mail to patient around fall prevention, and patient will review  [place] outreachfor ongoing transition of careto continue with scheduled phone call next week  [facility] CM Care Plan Problem One     Most Recent Value  Care Plan Problem One  High risk for hospital readmission related to/ as evidenced by recent hospitalization for mechanical fall with (R) hip fracture and subsequent SNF rehabilitaion visit  Role Documenting the Problem One  Care Management Coordinator  Care Plan for Problem One  Active  [facility]N Long Term Goal   Over the next 8 days, patient will not experience hospital re-admission as evidenced by patient/ caregiver reporting and review of EMR during [facility] RN CM outreach [goal extended today aorund provider appts]  [name] Long Term Goal Start Date  04/10/19  Interventions for Problem One Long Term Goal  Discussed current clinical condition with patient and confirmed that he has no current clinical concerns,  encouraged patient to promptly notify care providers for any new concerns/ issues/ problems,  discussed patient's plan to obtain corona virus vaccine  [facility] CM Short Term Goal #1   Over the next 30 days, patient will actively participate in home health PT services as ordered post- SNF discharge, as evidenced by patient reporting and collaboration with home health team as indicated during [name] RN CM outreach  [name] CM Short Term Goal #1 Start Date  03/16/19  [facility] CM Short Term Goal #1 Met Date  04/10/19 [Goal Met]  Interventions for Short Term Goal #1  Confirmed with patient that home health services have now signed off,  confirmed that patient continues using assistive devices for fall prevention  [facility] CM Short Term Goal #2   Over the next 8 days, patient will attend all scheduled provider  appointments as evidenced by patient and caregiver reporting and collaboration with care providers as indicated during [facility] RN CM outreach [Goal extended today around provider appts]  [facility] CM Short Term Goal #2 Start Date  04/10/19 [Goal extended today]  Interventions for Short Term Goal #2  Reviewed recent and upcoming provider appointments with patient and confirmed that he is aware of all provider appointments and has plans to attend PCP office visit next week on 04/17/2019,  confirmed that patient has reliable transportation through caregiver/ daughter to all scheduled provider appointments    [facility] CM Care Plan Problem Two     Most Recent Value  Care Plan Problem Two  High Risk for falls as evidenced by history of fall with injury  Role Documenting the Problem Two  Care Management Coordinator  Care Plan for Problem Two  Active  Interventions for Problem Two Long Term Goal   Discussed with patient recently ended home health PT services and encouraged patient to continue exercises learned with home health PT,  discussed basics of fall prevention with patient and placed printed EMMI educational material in mail to patient  [facility] Long Term Goal  Over the next 60 days, patient will not experience any new falls, as evidenced by patient/ caregiver reporting during [facility] RN CM outreach  [facility] Long Term Goal Start Date  04/10/19  [facility] CM Short Term Goal #1   Over the next 30 days, patient will continue using assistive devices for fall prevention as evidenced by patient and caregiver reporting during [facility] RN CM outreach  [facility] CM Short Term Goal #1 Start Date  04/10/19  Interventions for Short Term Goal #2   Confirmed that patient contionues using assistive devices for fall prevention, and confirmed that he has not experienced any new/ recent falls, post- SNF discharge,  positive reinforcement provided,  encouraged patient to continue using assistive devices as instructed post- SNF discharge     [name] Mckinney Tousey,  RN, BSN, CCRN [employer] [facility] Coordinator [facility] Care Management  [phone]    

## 2019-04-11 ENCOUNTER — Other Ambulatory Visit: Payer: Self-pay | Admitting: Internal Medicine

## 2019-04-12 ENCOUNTER — Ambulatory Visit: Payer: Medicare Other

## 2019-04-12 ENCOUNTER — Other Ambulatory Visit (INDEPENDENT_AMBULATORY_CARE_PROVIDER_SITE_OTHER): Payer: Medicare Other

## 2019-04-12 ENCOUNTER — Other Ambulatory Visit: Payer: Self-pay

## 2019-04-12 ENCOUNTER — Ambulatory Visit (INDEPENDENT_AMBULATORY_CARE_PROVIDER_SITE_OTHER): Payer: Medicare Other

## 2019-04-12 DIAGNOSIS — N1832 Chronic kidney disease, stage 3b: Secondary | ICD-10-CM

## 2019-04-12 DIAGNOSIS — E559 Vitamin D deficiency, unspecified: Secondary | ICD-10-CM

## 2019-04-12 DIAGNOSIS — Z0000 Encounter for general adult medical examination without abnormal findings: Secondary | ICD-10-CM | POA: Diagnosis not present

## 2019-04-12 DIAGNOSIS — E43 Unspecified severe protein-calorie malnutrition: Secondary | ICD-10-CM

## 2019-04-12 DIAGNOSIS — E538 Deficiency of other specified B group vitamins: Secondary | ICD-10-CM | POA: Diagnosis not present

## 2019-04-12 DIAGNOSIS — D500 Iron deficiency anemia secondary to blood loss (chronic): Secondary | ICD-10-CM

## 2019-04-12 LAB — BASIC METABOLIC PANEL
BUN: 71 mg/dL — ABNORMAL HIGH (ref 6–23)
CO2: 27 mEq/L (ref 19–32)
Calcium: 8.9 mg/dL (ref 8.4–10.5)
Chloride: 107 mEq/L (ref 96–112)
Creatinine, Ser: 2 mg/dL — ABNORMAL HIGH (ref 0.40–1.50)
GFR: 31.27 mL/min — ABNORMAL LOW (ref >60.00)
Glucose, Bld: 107 mg/dL — ABNORMAL HIGH (ref 70–99)
Potassium: 4.7 mEq/L (ref 3.5–5.1)
Sodium: 139 mEq/L (ref 135–145)

## 2019-04-12 LAB — CBC WITH DIFFERENTIAL/PLATELET
Basophils Absolute: 0.1 10*3/uL (ref 0.0–0.1)
Basophils Relative: 1 % (ref 0.0–3.0)
Eosinophils Absolute: 0.3 10*3/uL (ref 0.0–0.7)
Eosinophils Relative: 5.4 % — ABNORMAL HIGH (ref 0.0–5.0)
HCT: 31.7 % — ABNORMAL LOW (ref 39.0–52.0)
Hemoglobin: 10.4 g/dL — ABNORMAL LOW (ref 13.0–17.0)
Lymphocytes Relative: 27.8 % (ref 12.0–46.0)
Lymphs Abs: 1.5 10*3/uL (ref 0.7–4.0)
MCHC: 32.8 g/dL (ref 30.0–36.0)
MCV: 102.1 fl — ABNORMAL HIGH (ref 78.0–100.0)
Monocytes Absolute: 0.5 10*3/uL (ref 0.1–1.0)
Monocytes Relative: 8.9 % (ref 3.0–12.0)
Neutro Abs: 3.1 10*3/uL (ref 1.4–7.7)
Neutrophils Relative %: 56.9 % (ref 43.0–77.0)
Platelets: 217 10*3/uL (ref 150.0–400.0)
RBC: 3.1 Mil/uL — ABNORMAL LOW (ref 4.22–5.81)
RDW: 14 % (ref 11.5–15.5)
WBC: 5.5 10*3/uL (ref 4.0–10.5)

## 2019-04-12 LAB — T3, FREE: T3, Free: 1.6 pg/mL — ABNORMAL LOW (ref 2.3–4.2)

## 2019-04-12 LAB — IBC + FERRITIN
Ferritin: 119 ng/mL (ref 22.0–322.0)
Iron: 53 ug/dL (ref 42–165)
Saturation Ratios: 18 % — ABNORMAL LOW (ref 20.0–50.0)
Transferrin: 210 mg/dL — ABNORMAL LOW (ref 212.0–360.0)

## 2019-04-12 LAB — HEPATIC FUNCTION PANEL
ALT: 19 U/L (ref 0–53)
AST: 24 U/L (ref 0–37)
Albumin: 3.6 g/dL (ref 3.5–5.2)
Alkaline Phosphatase: 94 U/L (ref 39–117)
Bilirubin, Direct: 0.1 mg/dL (ref 0.0–0.3)
Total Bilirubin: 0.5 mg/dL (ref 0.2–1.2)
Total Protein: 6.4 g/dL (ref 6.0–8.3)

## 2019-04-12 LAB — T4, FREE: Free T4: 1.11 ng/dL (ref 0.60–1.60)

## 2019-04-12 LAB — VITAMIN D 25 HYDROXY (VIT D DEFICIENCY, FRACTURES): VITD: 50.75 ng/mL (ref 30.00–100.00)

## 2019-04-12 LAB — VITAMIN B12: Vitamin B-12: 554 pg/mL (ref 211–911)

## 2019-04-12 LAB — TSH: TSH: 2.01 u[IU]/mL (ref 0.35–4.50)

## 2019-04-12 NOTE — Progress Notes (Signed)
PCP notes:  Health Maintenance: No gaps noted   Abnormal Screenings: MMSE score 19   Patient concerns: none   Nurse concerns: none   Next PCP appt: 04/17/2019 @ 3:20 pm

## 2019-04-12 NOTE — Progress Notes (Signed)
Subjective:   [name] [name] is a 86 y.o. male who presents for Medicare Annual/Subsequent preventive examination.  Review of Systems: N/A   This visit is being conducted through telemedicine via telephone at the nurse health advisor's home address due to the COVID-19 pandemic. This patient has given me verbal consent via doximity to conduct this visit, patient states they are participating from their home address. Patient and myself are on the telephone call. There is no referral for this visit. Some vital signs may be absent or patient reported.    Patient identification: identified by name, DOB, and current address   Cardiac Risk Factors include: advanced age (>69men, >88 women);dyslipidemia;male gender     Objective:    Vitals: There were no vitals taken for this visit.  There is no height or weight on file to calculate BMI.  Advanced Directives 04/12/2019 03/22/2019 02/17/2019 02/14/2019 09/21/2018 09/18/2018 09/16/2018  Does Patient Have a Medical Advance Directive? Yes Yes - No Yes Yes Yes  Type of Paramedic of [place];Living will Miguel Barrera;Living will - - Living will Living will -  Does patient want to make changes to medical advance directive? - No - Patient declined - - No - Patient declined No - Patient declined -  Copy of [place] in Chart? Yes - validated most recent copy scanned in chart (See row information) - - - - - -  Would patient like information on creating a medical advance directive? - - No - Patient declined - - - -    Tobacco Social History   Tobacco Use  Smoking Status Never Smoker  Smokeless Tobacco Never Used     Counseling given: Not Answered   Clinical Intake:  Pre-visit preparation completed: Yes  Pain : No/denies pain Pain Score: 0-No pain     Nutritional Risks: None Diabetes: No  How often do you need to have someone help you when you read instructions, pamphlets, or other  written materials from your doctor or pharmacy?: 1 - Never What is the last grade level you completed in school?: college  Interpreter Needed?: No  Information entered by :: CJohnson, LPN  Past Medical History:  Diagnosis Date  . AICD (automatic cardioverter/defibrillator) present    PACEMAKER  . Anemia   . Cancer ([place])   . Cardiac pacemaker in situ 07/2005   symptomatic bradycardia  . CKD (chronic kidney disease) stage 3, GFR 30-59 ml/min 04/13/2017  . Diverticulosis of colon (without mention of hemorrhage)   . Dysrhythmia   . 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 GERD (gastroesophageal reflux disease)   . GI hemorrhage   . Hyperlipidemia   . Hypertension   . Lower extremity edema   . Neck arthritis, Severe, multi-level 06/09/2013  . Paroxysmal atrial fibrillation ([name])    s/p failed ablation  . Peptic ulcer, unspecified site, unspecified as acute or chronic, without mention of hemorrhage, perforation, or obstruction 1975  . Personal history of malignant neoplasm of prostate 12/2000   5 weeks radiation, radiation seeds  . Radiation proctitis   . Sinoatrial node dysfunction ([name])   . Unspecified glaucoma(365.9)   . Unspecified hypothyroidism   . Vitamin B12 deficiency    Past Surgical History:  Procedure Laterality Date  . CARDIAC CATHETERIZATION    . CARDIAC ELECTROPHYSIOLOGY [place] AND ABLATION  2001, 2003   failure  . CATARACT EXTRACTION    .  CYSTOGRAM N/A 09/21/2018   Procedure: CYSTOGRAM;  Surgeon: Abbie Sons, MD;  Location: [facility];  Service: Urology;  Laterality: N/A;  . CYSTOSCOPY WITH URETHRAL DILATATION N/A 01/29/2017   Procedure: CYSTOSCOPY WITH URETHRAL DILATATION;  Surgeon: Abbie Sons, MD;  Location: [facility];  Service: Urology;  Laterality: N/A;  . CYSTOSCOPY WITH URETHRAL DILATATION N/A 09/21/2018   Procedure: CYSTOSCOPY WITH URETHRAL DILATATION;  Surgeon: Abbie Sons, MD;   Location: [facility];  Service: Urology;  Laterality: N/A;  . EP IMPLANTABLE DEVICE N/A 02/24/2016   Procedure: PPM Generator Changeout;  Surgeon: Deboraha Sprang, MD;  Location: [facility];  Service: Cardiovascular;  Laterality: N/A;  . gastric ulcer surgery    . HERNIA REPAIR  1994, 2001, 2003   s/p drainage and complications, 05/3293, 03/8839 mesh removal  . HIP ARTHROPLASTY Right 02/16/2019   Procedure: RIGHT ANTERIOR HIP HEMIARTHROPLASTY,CEMENTED;  Surgeon: Hessie Knows, MD;  Location: [facility];  Service: Orthopedics;  Laterality: Right;  . INSERT / REPLACE / REMOVE PACEMAKER  07/2005   symptomatic bradycardia  . kidney stones    . NOSE SURGERY     cancer removed   . PROSTATE SURGERY    . SKIN CANCER DESTRUCTION    . TOOTH EXTRACTION     Family History  Problem Relation Age of Onset  . Breast cancer Mother   . Bone cancer Father   . Alcohol abuse Other   . Coronary artery disease Other   . Dementia Other   . Diabetes Other    Social History   Socioeconomic History  . Marital status: Widowed    Spouse name: Not on file  . Number of children: Not on file  . Years of education: Not on file  . Highest education level: Not on file  Occupational History  . Not on file  Tobacco Use  . Smoking status: Never Smoker  . Smokeless tobacco: Never Used  Substance and Sexual Activity  . Alcohol use: No  . Drug use: No  . Sexual activity: Yes    Birth control/protection: None  Other Topics Concern  . Not on file  Social History Narrative  . Not on file   Social Determinants of Health   Financial Resource Strain: Low Risk   . Difficulty of Paying Living Expenses: Not hard at all  Food Insecurity: No Food Insecurity  . Worried About Charity fundraiser in the Last Year: Never true  . Ran Out of Food in the Last Year: Never true  Transportation Needs: No Transportation Needs  . Lack of Transportation (Medical): No  . Lack of Transportation (Non-Medical): No  Physical  Activity:   . Days of Exercise per Week: Not on file  . Minutes of Exercise per Session: Not on file  Stress:   . Feeling of Stress : Not on file  Social Connections:   . Frequency of Communication with Friends and Family: Not on file  . Frequency of Social Gatherings with Friends and Family: Not on file  . Attends Religious Services: Not on file  . Active Member of Clubs or Organizations: Not on file  . Attends Archivist Meetings: Not on file  . Marital Status: Not on file    Outpatient Encounter Medications as of 04/12/2019  Medication Sig  . acetaminophen (TYLENOL) 500 MG tablet Take 500 mg by mouth every 6 (six) hours as needed for moderate pain or headache.  . alendronate (FOSAMAX) 70 MG tablet TAKE 1  TABLET BY MOUTH EVERY 7 DAYS WITH A FULL GLASS OF WATER ON AN EMPTY STOMACH  . amiodarone (PACERONE) 200 MG tablet Take 0.5 tablets (100 mg total) by mouth daily. Please call to schedule office visit for further refills. 2nd attempt  . Cyanocobalamin (VITAMIN B-12 IJ) Inject 1,000 mcg as directed every 30 (thirty) days.   Marland Kitchen docusate sodium (COLACE) 50 MG capsule Take 50 mg by mouth 2 (two) times daily as needed for mild constipation.  . dorzolamide-timolol (COSOPT) 22.3-6.8 MG/ML ophthalmic solution Place 1 drop into both eyes 2 (two) times daily.    . ferrous sulfate 325 (65 FE) MG EC tablet Take 325 mg by mouth daily with breakfast.    . furosemide (LASIX) 20 MG tablet Take 2 tablets (40 mg) by mouth once every other day as directed  . levothyroxine (SYNTHROID) 100 MCG tablet TAKE 1 TABLET BY MOUTH DAILY BEFORE BREAKFAST  . loperamide (IMODIUM) 2 MG capsule Take 2 mg by mouth as needed for diarrhea or loose stools.  . Multiple Vitamin (MULTIVITAMIN) tablet Take 1 tablet by mouth daily.    . pantoprazole (PROTONIX) 40 MG tablet TAKE 1 TABLET(40 MG) BY MOUTH DAILY  . polyethylene glycol (MIRALAX / GLYCOLAX) packet Take 17 g by mouth daily as needed for moderate constipation.     . pravastatin (PRAVACHOL) 40 MG tablet Take 0.5 tablets (20 mg total) by mouth at bedtime.  . simethicone (MYLICON) 676 MG chewable tablet Chew 125 mg by mouth every 6 (six) hours as needed for flatulence.  . tamsulosin (FLOMAX) 0.4 MG CAPS capsule TAKE 1 CAPSULE(0.4 MG) BY MOUTH DAILY  . warfarin (COUMADIN) 1 MG tablet TAKE 1 AND 1/2 TABLETS BY MOUTH DAILY AS DIRECTED  . Wheat Dextrin (BENEFIBER DRINK MIX PO) Take 1 scoop by mouth daily.    No facility-administered encounter medications on file as of 04/12/2019.    Activities of Daily Living In your present state of health, do you have any difficulty performing the following activities: 04/12/2019 03/28/2019  Hearing? Y -  Comment wears hearing aids -  Vision? N -  Difficulty concentrating or making decisions? N -  Walking or climbing stairs? N -  Comment - -  Dressing or bathing? N -  Doing errands, shopping? [place] and eating ? N -  Using the Toilet? N -  In the past six months, have you accidently leaked urine? Tempie Donning  Comment wears depends "Only during the night- I wear depends at night"  Do you have problems with loss of bowel control? Y N  Comment wears depends -  Managing your Medications? N -  Managing your Finances? N -  Housekeeping or managing your Housekeeping? N -  Comment - -  Some recent data might be hidden    Patient Care Team: Owens Loffler, MD as PCP - General (Family Medicine) Deboraha Sprang, MD as PCP - Cardiology (Cardiology) Knox Royalty, RN as [place] Management   Assessment:   This is a routine wellness examination for [place].  Exercise Activities and Dietary recommendations Current Exercise Habits: Home exercise routine, Type of exercise: walking, Time (Minutes): 15, Frequency (Times/Week): 7, Weekly Exercise (Minutes/Week): 105, Intensity: Mild, Exercise limited by: None identified  Goals    . DIET - INCREASE WATER INTAKE     Starting  04/08/2018, I will continue to drink at least 10 glasses of water daily.     . Patient Stated  04/12/2019, I will maintain and continue medications as prescribed.       Fall Risk Fall Risk  04/12/2019 03/28/2019 03/22/2019 03/16/2019 04/08/2018  Falls in the past year? 1 (No Data) (No Data) 1 1  Comment tripped and fell Falls screenning previously completed- patient denies new/ recent falls Falls screening previously completed; denies new/ recent falls - fell in bathroom due loss of balance; denies injury   Number falls in past yr: 0 - - 1 0  Comment broke hip and arm - - - -  Injury with Fall? 0 - - 1 0  Comment broke hip and arm - - - -  Risk for fall due to : Medication side effect;History of fall(s) History of fall(s);Impaired mobility - History of fall(s);Impaired mobility -  Follow up Falls evaluation completed;Falls prevention discussed Falls prevention discussed Falls prevention discussed Education provided;Falls prevention discussed -   Is the patient's home free of loose throw rugs in walkways, pet beds, electrical cords, etc?   yes      Grab bars in the bathroom? yes      Handrails on the stairs?   yes      Adequate lighting?   yes  Timed Get Up and Go Performed: N/A  Depression Screen PHQ 2/9 Scores 04/12/2019 03/16/2019 04/08/2018 04/06/2017  PHQ - 2 Score 0 0 0 0  PHQ- 9 Score 0 - 0 0    Cognitive Function MMSE - Mini Mental State Exam 04/12/2019 04/08/2018 04/06/2017  Orientation to time 5 5 5   Orientation to Place 5 5 5   Registration 3 3 3   Attention/ Calculation 5 0 0  Recall 0 3 3  Language- name 2 objects - 0 0  Language- repeat 1 1 1   Language- follow 3 step command - 3 3  Language- read & follow direction - 0 0  Write a sentence - 0 0  Copy design - 0 0  Total score - 20 20  Mini Cog  Mini-Cog screen was completed. Maximum score is 22. A value of 0 denotes this part of the MMSE was not completed or the patient failed this part of the Mini-Cog screening.        Immunization History  Administered Date(s) Administered  . Fluad Quad(high Dose 65+) 01/04/2019  . H1N1 05/09/2008  . Influenza Split 02/10/2011  . Influenza Whole 01/15/2009, 01/13/2010  . Influenza, High Dose Seasonal PF 12/21/2012, 02/23/2017  . Influenza,inj,Quad PF,6+ Mos 01/08/2014, 01/25/2015, 01/06/2018  . Pneumococcal Conjugate-13 12/14/2013  . Pneumococcal Polysaccharide-23 03/31/2003, 03/30/2005  . Td 03/31/2003, 04/06/2017  . Zoster 02/06/2008    Qualifies for Shingles Vaccine?  Yes  Screening Tests Health Maintenance  Topic Date Due  . TETANUS/TDAP  04/07/2027  . INFLUENZA VACCINE  Completed  . PNA vac Low Risk Adult  Completed   Cancer Screenings: Lung: Low Dose CT Chest recommended if Age 42-80 years, 30 pack-year currently smoking OR have quit w/in 15years. Patient does not qualify. Colorectal: no longer required  Additional Screenings:  Hepatitis C Screening: N/A      Plan:   Patient will maintain and continue medications as prescribed.   I have personally reviewed and noted the following in the patient's chart:   . Medical and social history . Use of alcohol, tobacco or illicit drugs  . Current medications and supplements . Functional ability and status . Nutritional status . Physical activity . Advanced directives . List of other physicians . Hospitalizations, surgeries, and ER visits in previous 12 months .  Vitals . Screenings to include cognitive, depression, and falls . Referrals and appointments  In addition, I have reviewed and discussed with patient certain preventive protocols, quality metrics, and best practice recommendations. A written personalized care plan for preventive services as well as general preventive health recommendations were provided to patient.     Andrez Grime, LPN  1/60/7371

## 2019-04-12 NOTE — Patient Instructions (Signed)
[name] , Thank you for taking time to come for your Medicare Wellness Visit. I appreciate your ongoing commitment to your health goals. Please review the following plan we discussed and let me know if I can assist you in the future.   Screening recommendations/referrals: Colonoscopy: no longer required Recommended yearly ophthalmology/optometry visit for glaucoma screening and checkup Recommended yearly dental visit for hygiene and checkup  Vaccinations: Influenza vaccine: Up to date, completed 01/04/2019 Pneumococcal vaccine: Completed series Tdap vaccine: Up to date, completed 04/06/2017 Shingles vaccine: discussed    Advanced directives: copy in chart  Conditions/risks identified: hyperlipidemia  Next appointment: 04/17/2019 @ 3:20 pm   Preventive Care 29 Years and Older, Male Preventive care refers to lifestyle choices and visits with your health care provider that can promote health and wellness. What does preventive care include?  A yearly physical exam. This is also called an annual well check.  Dental exams once or twice a year.  Routine eye exams. Ask your health care provider how often you should have your eyes checked.  Personal lifestyle choices, including:  Daily care of your teeth and gums.  Regular physical activity.  Eating a healthy diet.  Avoiding tobacco and drug use.  Limiting alcohol use.  Practicing safe sex.  Taking low doses of aspirin every day.  Taking vitamin and mineral supplements as recommended by your health care provider. What happens during an annual well check? The services and screenings done by your health care provider during your annual well check will depend on your age, overall health, lifestyle risk factors, and family history of disease. Counseling  Your health care provider may ask you questions about your:  Alcohol use.  Tobacco use.  Drug use.  Emotional well-being.  Home and relationship well-being.  Sexual  activity.  Eating habits.  History of falls.  Memory and ability to understand (cognition).  Work and work Statistician. Screening  You may have the following tests or measurements:  Height, weight, and BMI.  Blood pressure.  Lipid and cholesterol levels. These may be checked every 5 years, or more frequently if you are over 26 years old.  Skin check.  Lung cancer screening. You may have this screening every year starting at age 93 if you have a 30-pack-year history of smoking and currently smoke or have quit within the past 15 years.  Fecal occult blood test (FOBT) of the stool. You may have this test every year starting at age 74.  Flexible sigmoidoscopy or colonoscopy. You may have a sigmoidoscopy every 5 years or a colonoscopy every 10 years starting at age 79.  Prostate cancer screening. Recommendations will vary depending on your family history and other risks.  Hepatitis C blood test.  Hepatitis B blood test.  Sexually transmitted disease (STD) testing.  Diabetes screening. This is done by checking your blood sugar (glucose) after you have not eaten for a while (fasting). You may have this done every 1-3 years.  Abdominal aortic aneurysm (AAA) screening. You may need this if you are a current or former smoker.  Osteoporosis. You may be screened starting at age 74 if you are at high risk. Talk with your health care provider about your test results, treatment options, and if necessary, the need for more tests. Vaccines  Your health care provider may recommend certain vaccines, such as:  Influenza vaccine. This is recommended every year.  Tetanus, diphtheria, and acellular pertussis (Tdap, Td) vaccine. You may need a Td booster every 10 years.  Zoster vaccine. You may need this after age 56.  Pneumococcal 13-valent conjugate (PCV13) vaccine. One dose is recommended after age 76.  Pneumococcal polysaccharide (PPSV23) vaccine. One dose is recommended after age  43. Talk to your health care provider about which screenings and vaccines you need and how often you need them. This information is not intended to replace advice given to you by your health care provider. Make sure you discuss any questions you have with your health care provider. Document Released: 04/12/2015 Document Revised: 12/04/2015 Document Reviewed: 01/15/2015 Elsevier Interactive Patient Education  2017 [employer] Prevention in the Home Falls can cause injuries. They can happen to people of all ages. There are many things you can do to make your home safe and to help prevent falls. What can I do on the outside of my home?  Regularly fix the edges of walkways and driveways and fix any cracks.  Remove anything that might make you trip as you walk through a door, such as a raised step or threshold.  Trim any bushes or trees on the path to your home.  Use bright outdoor lighting.  Clear any walking paths of anything that might make someone trip, such as rocks or tools.  Regularly check to see if handrails are loose or broken. Make sure that both sides of any steps have handrails.  Any raised decks and porches should have guardrails on the edges.  Have any leaves, snow, or ice cleared regularly.  Use sand or salt on walking paths during winter.  Clean up any spills in your garage right away. This includes oil or grease spills. What can I do in the bathroom?  Use night lights.  Install grab bars by the toilet and in the tub and shower. Do not use towel bars as grab bars.  Use non-skid mats or decals in the tub or shower.  If you need to sit down in the shower, use a plastic, non-slip stool.  Keep the floor dry. Clean up any water that spills on the floor as soon as it happens.  Remove soap buildup in the tub or shower regularly.  Attach bath mats securely with double-sided non-slip rug tape.  Do not have throw rugs and other things on the floor that can make  you trip. What can I do in the bedroom?  Use night lights.  Make sure that you have a light by your bed that is easy to reach.  Do not use any sheets or blankets that are too big for your bed. They should not hang down onto the floor.  Have a firm chair that has side arms. You can use this for support while you get dressed.  Do not have throw rugs and other things on the floor that can make you trip. What can I do in the kitchen?  Clean up any spills right away.  Avoid walking on wet floors.  Keep items that you use a lot in easy-to-reach places.  If you need to reach something above you, use a strong step stool that has a grab bar.  Keep electrical cords out of the way.  Do not use floor polish or wax that makes floors slippery. If you must use wax, use non-skid floor wax.  Do not have throw rugs and other things on the floor that can make you trip. What can I do with my stairs?  Do not leave any items on the stairs.  Make sure that there are  handrails on both sides of the stairs and use them. Fix handrails that are broken or loose. Make sure that handrails are as long as the stairways.  Check any carpeting to make sure that it is firmly attached to the stairs. Fix any carpet that is loose or worn.  Avoid having throw rugs at the top or bottom of the stairs. If you do have throw rugs, attach them to the floor with carpet tape.  Make sure that you have a light switch at the top of the stairs and the bottom of the stairs. If you do not have them, ask someone to add them for you. What else can I do to help prevent falls?  Wear shoes that:  Do not have high heels.  Have rubber bottoms.  Are comfortable and fit you well.  Are closed at the toe. Do not wear sandals.  If you use a stepladder:  Make sure that it is fully opened. Do not climb a closed stepladder.  Make sure that both sides of the stepladder are locked into place.  Ask someone to hold it for you, if  possible.  Clearly mark and make sure that you can see:  Any grab bars or handrails.  First and last steps.  Where the edge of each step is.  Use tools that help you move around (mobility aids) if they are needed. These include:  Canes.  Walkers.  Scooters.  Crutches.  Turn on the lights when you go into a dark area. Replace any light bulbs as soon as they burn out.  Set up your furniture so you have a clear path. Avoid moving your furniture around.  If any of your floors are uneven, fix them.  If there are any pets around you, be aware of where they are.  Review your medicines with your doctor. Some medicines can make you feel dizzy. This can increase your chance of falling. Ask your doctor what other things that you can do to help prevent falls. This information is not intended to replace advice given to you by your health care provider. Make sure you discuss any questions you have with your health care provider. Document Released: 01/10/2009 Document Revised: 08/22/2015 Document Reviewed: 04/20/2014 Elsevier Interactive Patient Education  2017 [employer].

## 2019-04-17 ENCOUNTER — Encounter: Payer: Medicare Other | Admitting: Family Medicine

## 2019-04-18 ENCOUNTER — Other Ambulatory Visit: Payer: Self-pay | Admitting: *Deleted

## 2019-04-18 ENCOUNTER — Encounter: Payer: Self-pay | Admitting: *Deleted

## 2019-04-18 NOTE — Patient Outreach (Signed)
[name]) Care Management [place], Transition of Care day # 34 Post- SNF discharge day # 39, without hospital readmission  04/18/2019  [name] [name] June 10, 1922 [id number]  Successful telephone outreach toWillard Laurance [name], 86 y/o male referred to [place] 03/14/2019 by [facility] RN PACfor transition of careafter patient experienced recent hospitalization November 17-23, 2020 for mechanical fall at home with (R) hip fracture; patient had (R) hip arthroplasty and was discharged to SNF for short- term rehabilitation. Patient was subsequently discharged from [facility] 12/11/2020to home/ self-care with home health services in place which have now ended.Patient has history including, but not limited to, HTN/ HLD; A-fib, on ACT; previous pacemaker insertion; GERD; DDD; CKD- stage IV; and BPH.  HIPAA/ identity verified; patient again reports doing well and denies concerns/ issues/ pain/ new- recent falls; confirms that he has continued using assistive devices for ambulation; primarily walker.  Patient denies current clinical concerns and sounds to be in no distress throughout phone call today.  Patient further reports:   -- no recent changes to medications; denies issues/ concerns around medications; reports he continues to self-manage medications, with assistance from caregiver/ daughter, whom he lives with  -- attended recent post-operative provider appointment with surgeon: states that he "was given a real good report," and that surgeon "released" patient from his care due to the excellent progress patient has made  -- previously scheduled PCP appointment re-scheduled by office staff, for June 21, 2019; daughter/ caregiver [name] [name] will continue to provide transportation  -- all upcoming provider appointments reviewed with patient today  -- feels "steady" and believes he is "doing great" with managing walker for fall prevention; denies need for ongoing home  health services, which have been completed for about a month- positive reinforcement/ encouragement provided accordingly  Patient deniesfurther issues, concerns, or problems today. Ire-provided and confirmed thatboth he/ caregiver havemy direct phone number, the main [place] CM office phone number, and the [facility] CM 24-hour nurse advice phone number should issues arise prior to next scheduled [facility] CM outreachnext month. Encouraged patient/ caregiverto contact me directly if needs, questions, issues, or concerns arise prior to next scheduled outreach; patient agreed to do so.  Plan:  Patient will take medications as prescribed and will attend all scheduled provider appointments  Patient will promptly notify care providers for any new concerns/ issues/ problems that arise  Patient will continueusing assistive devices for ambulation  Patient will continue independently performing PT exercises learned during home health sessions  [place] outreachto continue with scheduled phone call next month  [facility] CM Care Plan Problem One     Most Recent Value  Care Plan Problem One  High risk for hospital readmission related to/ as evidenced by recent hospitalization for mechanical fall with (R) hip fracture and subsequent SNF rehabilitaion visit  Role Documenting the Problem One  Care Management Coordinator  Care Plan for Problem One  Not Active  [facility] Long Term Goal   Over the next 8 days, patient will not experience hospital re-admission as evidenced by patient/ caregiver reporting and review of EMR during [facility] RN CM outreach  [facility] Long Term Goal Start Date  04/10/19  [facility] Long Term Goal Met Date  04/18/19 [facility] Met]  Interventions for Problem One Long Term Goal  Discussed with patient current clinical condition and confirmed that neither he nor caregiver have any current concerns,  confirmed that patient has not had any new ED or hospital visits  [facility] CM Short Term  Goal #2   Over  the next 8 days, patient will attend all scheduled provider appointments as evidenced by patient and caregiver reporting and collaboration with care providers as indicated during [facility] RN CM outreach  [facility] CM Short Term Goal #2 Start Date  04/10/19  [facility] CM Short Term Goal #2 Met Date  04/18/19 [Goal Met]  Interventions for Short Term Goal #2  Confirmed that patient has attended all recent provider appointments, and that his caregiver/ daughter continues providing transportation and attending all appointments along with patient,  reviewed upcoming scheduled provider appointments and ensured that patient has accurate understanding of all with plans to attend as scheduled    [facility] CM Care Plan Problem Two     Most Recent Value  Care Plan Problem Two  High Risk for falls as evidenced by history of fall with injury  Role Documenting the Problem Two  Care Management [place] for Problem Two  Active  Interventions for Problem Two Long Term Goal   Using teach back method, reiterated previously provided education around fall prevention,  confirmed that patient has not had any new or recent falls.  Confirmed that patient continues using cane/ walker for ambulation  [facility] Long Term Goal  Over the next 60 days, patient will not experience any new falls, as evidenced by patient/ caregiver reporting during [facility] RN CM outreach  [place] Term Goal Start Date  04/10/19  [facility] CM Short Term Goal #1   Over the next 30 days, patient will continue using assistive devices for fall prevention as evidenced by patient and caregiver reporting during [facility] RN CM outreach [Goal re-established today]  [facility] CM Short Term Goal #1 Start Date  04/18/19 [Goal re-established today]  Interventions for Short Term Goal #2   Confirmed that patient continues using assistive devices for ambulation and believes he is doing well now that home health services have ended     Oneta Rack, RN, BSN, [employer]  Coordinator [facility] Care Management  [phone]

## 2019-04-25 ENCOUNTER — Ambulatory Visit (INDEPENDENT_AMBULATORY_CARE_PROVIDER_SITE_OTHER): Payer: Medicare Other | Admitting: *Deleted

## 2019-04-25 DIAGNOSIS — I495 Sick sinus syndrome: Secondary | ICD-10-CM

## 2019-04-25 LAB — CUP PACEART REMOTE DEVICE CHECK
Battery Remaining Longevity: 83 mo
Battery Voltage: 3.01 V
Brady Statistic AP VP Percent: 5.89 %
Brady Statistic AP VS Percent: 82.3 %
Brady Statistic AS VP Percent: 1.58 %
Brady Statistic AS VS Percent: 10.24 %
Brady Statistic RA Percent Paced: 86.65 %
Brady Statistic RV Percent Paced: 7.62 %
Date Time Interrogation Session: 20210126103624
Implantable Lead Implant Date: 20070525
Implantable Lead Implant Date: 20070525
Implantable Lead Location: 753859
Implantable Lead Location: 753860
Implantable Lead Model: 5076
Implantable Lead Model: 5076
Implantable Pulse Generator Implant Date: 20171127
Lead Channel Impedance Value: 342 Ohm
Lead Channel Impedance Value: 342 Ohm
Lead Channel Impedance Value: 361 Ohm
Lead Channel Impedance Value: 399 Ohm
Lead Channel Pacing Threshold Amplitude: 0.625 V
Lead Channel Pacing Threshold Amplitude: 0.75 V
Lead Channel Pacing Threshold Pulse Width: 0.4 ms
Lead Channel Pacing Threshold Pulse Width: 0.4 ms
Lead Channel Sensing Intrinsic Amplitude: 1.25 mV
Lead Channel Sensing Intrinsic Amplitude: 1.25 mV
Lead Channel Sensing Intrinsic Amplitude: 1.625 mV
Lead Channel Sensing Intrinsic Amplitude: 1.625 mV
Lead Channel Setting Pacing Amplitude: 2 V
Lead Channel Setting Pacing Amplitude: 2.5 V
Lead Channel Setting Pacing Pulse Width: 0.4 ms
Lead Channel Setting Sensing Sensitivity: 0.6 mV

## 2019-05-01 ENCOUNTER — Ambulatory Visit (INDEPENDENT_AMBULATORY_CARE_PROVIDER_SITE_OTHER): Payer: Medicare Other

## 2019-05-01 ENCOUNTER — Other Ambulatory Visit: Payer: Self-pay

## 2019-05-01 DIAGNOSIS — I480 Paroxysmal atrial fibrillation: Secondary | ICD-10-CM

## 2019-05-01 DIAGNOSIS — Z5181 Encounter for therapeutic drug level monitoring: Secondary | ICD-10-CM

## 2019-05-01 LAB — POCT INR: INR: 1.8 — AB (ref 2.0–3.0)

## 2019-05-01 NOTE — Patient Instructions (Signed)
Please START NEW DOSAGE of 1.5 tablets every day.  Recheck in 3 weeks.

## 2019-05-03 ENCOUNTER — Ambulatory Visit (INDEPENDENT_AMBULATORY_CARE_PROVIDER_SITE_OTHER): Payer: Medicare Other | Admitting: *Deleted

## 2019-05-03 ENCOUNTER — Other Ambulatory Visit: Payer: Self-pay

## 2019-05-03 DIAGNOSIS — E538 Deficiency of other specified B group vitamins: Secondary | ICD-10-CM

## 2019-05-03 MED ORDER — CYANOCOBALAMIN 1000 MCG/ML IJ SOLN
1000.0000 ug | Freq: Once | INTRAMUSCULAR | Status: AC
  Administered 2019-05-03: 1000 ug via INTRAMUSCULAR

## 2019-05-03 NOTE — Progress Notes (Signed)
Per orders of Dr. Lorelei Pont, injection of Vit B12 given by Nyoka Cowden, Dusten Ellinwood M. Patient tolerated injection well.

## 2019-05-08 ENCOUNTER — Other Ambulatory Visit: Payer: Self-pay | Admitting: *Deleted

## 2019-05-08 MED ORDER — ALENDRONATE SODIUM 70 MG PO TABS: ORAL_TABLET | ORAL | 3 refills | Status: DC

## 2019-05-15 ENCOUNTER — Other Ambulatory Visit: Payer: Self-pay | Admitting: Internal Medicine

## 2019-05-16 ENCOUNTER — Other Ambulatory Visit: Payer: Self-pay | Admitting: Internal Medicine

## 2019-05-16 ENCOUNTER — Other Ambulatory Visit: Payer: Self-pay

## 2019-05-16 MED ORDER — AMIODARONE HCL 200 MG PO TABS: 100.0000 mg | ORAL_TABLET | Freq: Every day | ORAL | 0 refills | Status: DC

## 2019-05-16 NOTE — Telephone Encounter (Signed)
*  STAT* If patient is at the pharmacy, call can be transferred to refill team.   1. Which medications need to be refilled? (please list name of each medication and dose if known) Amiodarone  2. Which pharmacy/location (including street and city if local pharmacy) is medication to be sent to? [place]  3. Do they need a 30 day or 90 day supply? [place]

## 2019-05-16 NOTE — Telephone Encounter (Signed)
Lvm to schedule

## 2019-05-16 NOTE — Telephone Encounter (Signed)
Patient needs an appointment for further refills. If patient does not want to schedule an appointment please make them aware to contact PCP for refills.   Thanks Ladies!

## 2019-05-18 ENCOUNTER — Encounter: Payer: Self-pay | Admitting: *Deleted

## 2019-05-18 ENCOUNTER — Other Ambulatory Visit: Payer: Self-pay | Admitting: *Deleted

## 2019-05-18 NOTE — Patient Outreach (Signed)
[place] [facility]) Care Management [place] Telephone Outreach Unsuccessful (consecutive) outreach attempt # 1- previously engaged patient  05/18/2019  [name] [name] 02-23-1923 [id number]  Unsuccessful telephone outreach toWillard Laurance [name], 86 y/o male referred to [place] 03/14/2019 by [facility] RN PACfor transition of careafter patient experienced recent hospitalization November 17-23, 2020 for mechanical fall at home with (R) hip fracture; patient had (R) hip arthroplasty and was discharged to SNF for short- term rehabilitation. Patient was subsequently discharged from [facility] 12/11/2020to home/ self-care with home health services in place which have now ended.Patient has history including, but not limited to, HTN/ HLD; A-fib, on ACT; previous pacemaker insertion; GERD; DDD; CKD- stage IV; and BPH.  HIPAA compliant voice mail message left for patient, requesting return call back.  Plan:  Will re-attempt [place] telephone outreach within 4 business days if I do not hear back from patient first.  Oneta Rack, RN, BSN, [name] Coordinator [facility] Care Management  [phone]

## 2019-05-19 ENCOUNTER — Other Ambulatory Visit: Payer: Self-pay | Admitting: *Deleted

## 2019-05-19 MED ORDER — TAMSULOSIN HCL 0.4 MG PO CAPS: ORAL_CAPSULE | ORAL | 0 refills | Status: DC

## 2019-05-22 ENCOUNTER — Ambulatory Visit (INDEPENDENT_AMBULATORY_CARE_PROVIDER_SITE_OTHER): Payer: Medicare Other

## 2019-05-22 ENCOUNTER — Other Ambulatory Visit: Payer: Self-pay

## 2019-05-22 DIAGNOSIS — Z5181 Encounter for therapeutic drug level monitoring: Secondary | ICD-10-CM | POA: Diagnosis not present

## 2019-05-22 DIAGNOSIS — I480 Paroxysmal atrial fibrillation: Secondary | ICD-10-CM

## 2019-05-22 LAB — POCT INR: INR: 3.5 — AB (ref 2.0–3.0)

## 2019-05-22 NOTE — Patient Instructions (Addendum)
-   skip warfarin tonight - tomorrow START NEW DOSAGE of 1.5 tablets every day EXCEPT 1 tablet on Mondays & Fridays. - recheck in 3 weeks.

## 2019-05-23 ENCOUNTER — Other Ambulatory Visit: Payer: Self-pay | Admitting: *Deleted

## 2019-05-23 NOTE — Patient Outreach (Signed)
[facility]) Care Management [place] Telephone Outreach Unsuccessful (consecutive) outreach attempt # 2- previously engaged patient  05/23/2019  COURT GRACIA 1923/01/03 [id number]  Unsuccessful second consecutive telephone outreach attempt toWillard Laurance Flatten, 86 y/o male referred to [place] 03/14/2019 by [facility] RN PACfor transition of careafter patient experienced recent hospitalization November 17-23, 2020 for mechanical fall at home with (R) hip fracture; patient had (R) hip arthroplasty and was discharged to SNF for short- term rehabilitation. Patient was subsequently discharged from [facility] 12/11/2020to home/ self-care with home health services in placewhich have now ended.Patient has history including, but not limited to, HTN/ HLD; A-fib, on ACT; previous pacemaker insertion; GERD; DDD; CKD- stage IV; and BPH.  HIPAA compliant voice mail message left for patient, requesting return call back.  Plan:  Will place [facility] Community CM unsuccessful patient outreach letter in mail requesting call back in writing  Will re-attempt [place] telephone outreach within 4 business days if I do not hear back from patient first.  Oneta Rack, RN, BSN, [employer] Coordinator [facility] Care Management  [phone]

## 2019-05-23 NOTE — Telephone Encounter (Signed)
Scheduled with Caryl Comes on 3/30

## 2019-05-24 DIAGNOSIS — H43813 Vitreous degeneration, bilateral: Secondary | ICD-10-CM | POA: Diagnosis not present

## 2019-05-25 DIAGNOSIS — H353131 Nonexudative age-related macular degeneration, bilateral, early dry stage: Secondary | ICD-10-CM | POA: Diagnosis not present

## 2019-05-29 ENCOUNTER — Encounter: Payer: Self-pay | Admitting: *Deleted

## 2019-05-29 ENCOUNTER — Other Ambulatory Visit: Payer: Self-pay | Admitting: *Deleted

## 2019-05-29 NOTE — Patient Outreach (Signed)
[name] [name]) Care Management [place]- SNF discharge day # 80 without [name] readmission  05/29/2019  [name] [name] 24-Feb-1923 [id number]  Successful telephone outreach attempt toWillard Laurance [name], 86 y/o male referred to [name] CM 03/14/2019 by [name] RN PACfor transition of careafter patient experienced recent hospitalization November 17-23, 2020 for mechanical fall at home with (R) hip fracture; patient had (R) hip arthroplasty and was discharged to SNF for short- term rehabilitation. Patient was subsequently discharged from [facility] 12/11/2020to home/ self-care with home health services in placewhich have now ended.Patient has history including, but not limited to, HTN/ HLD; A-fib, on ACT; previous pacemaker insertion; GERD; DDD; CKD- stage IV; and BPH.  HIPAA/ identity verified with patient and his daughter/ caregiver, whom patient lives with; both deny current clinical concerns and report patient "doing great."  Deny changes to medications, pain, new/ recent falls; confirms that patient continues using assistive devices for ambulation, and that he has occasionally not used walker when he is at home; patient is very happy to have progressed and confirms that he always takes his walker/ cane with him when he leaves the home, "just in case."  Patient sounds to be in no distress throughout phone call and he denies new/ recent changes to medications.  Accurately reports that he has scheduled PCP office visit on June 21, 2019 and confirms that his daughter/ caregiver will continue to provide transportation.  Patient also confirms that he has received both corona virus vaccine doses and tolerated both without problems.  Patient denies issues, concerns, or problems today.  We discussed that patient has thus far made excellent progress in meeting his previously established [place] [name] CM goals; we agreed to next outreach in one month, after upcoming scheduled PCP  appointment; discussed possibility of case closure with patient, pending his ongoing progress, and patient is agreeable.  I confirmed that patient/ caregiver has my direct phone number, the main [name] [name] CM office phone number, and the [place] [name] CM 24-hour nurse advice phone number should issues arise prior to next scheduled [place] outreach.  Encouraged patient/ caregiver to contact me directly if needs, questions, issues, or concerns arise prior to next scheduled outreach; patient agreed to do so.  Plan:  Patient will take medications as prescribed and will attend all scheduled provider appointments  Patient will promptly notify care providers for any new concerns/ issues/ problems that arise  Patient will continueusing assistive devices for ambulation  [facility] CM outreachto continue with scheduled phone call next month, possibly for case closure   [facility] CM Care Plan Problem Two     Most Recent Value  Care Plan Problem Two  High Risk for falls as evidenced by history of fall with injury  Role Documenting the Problem Two  Care Management [place] for Problem Two  Active  Interventions for Problem Two Long Term Goal   Confirmed that patient has had no recent falls and has started ambulating at home at times without need to use assistive devices,  confirmed that patient and caregiver believe that patient continues to improve clinically and that both deny current clinical concerns  [facility] Long Term Goal  Over the next 35 days, patient will not experience any new falls, as evidenced by patient/ caregiver reporting during [facility] RN CM outreach [Goal re-established/ extended today]  [facility] Long Term Goal Start Date  05/29/19  [facility] CM Short Term Goal #1   Over the next 30 days, patient will continue using  assistive devices for fall prevention as evidenced by patient and caregiver reporting during [facility] RN CM outreach  [facility] CM Short Term Goal #1 Start Date  04/18/19  [facility] CM Short Term  Goal #1 Met Date   05/29/19 [Goal met]  Interventions for Short Term Goal #2   Confirmed that patient has continued using assistive devices for fall prevention,  discussed his progression in ambulating aorund his home without use of walker/ cane- positive reinforcement provided to patient and caregiver for continuing to use assistive devices when patient goes out/ has potential to become fatigued  [facility] CM Short Term Goal #2   Over the next 30 days, patient will attend regularly scheduled PCP office visit, as evidenced by patient and caregiver reporting and review of same during [facility] RN CM outreach  [facility] CM Short Term Goal #2 Start Date  05/29/19  Interventions for Short Term Goal #2  Confirmed that patient has accurate understanding of upcoming provider appointment and continues to have reliable transportation to errands and appointments by caregiver/ daughter     Oneta Rack, RN, BSN, [place] Coordinator [name] Care Management  [phone]

## 2019-06-05 ENCOUNTER — Other Ambulatory Visit: Payer: Self-pay | Admitting: *Deleted

## 2019-06-05 MED ORDER — FUROSEMIDE 20 MG PO TABS: ORAL_TABLET | ORAL | 1 refills | Status: DC

## 2019-06-12 ENCOUNTER — Other Ambulatory Visit: Payer: Self-pay

## 2019-06-12 ENCOUNTER — Ambulatory Visit (INDEPENDENT_AMBULATORY_CARE_PROVIDER_SITE_OTHER): Payer: Medicare Other

## 2019-06-12 DIAGNOSIS — I480 Paroxysmal atrial fibrillation: Secondary | ICD-10-CM

## 2019-06-12 LAB — POCT INR: INR: 2.8 (ref 2.0–3.0)

## 2019-06-12 NOTE — Patient Instructions (Signed)
-   continue warfarin dosage of 1.5 tablets every day EXCEPT 1 tablet on Mondays & Fridays. - recheck in 4 weeks.

## 2019-06-19 ENCOUNTER — Other Ambulatory Visit: Payer: Self-pay | Admitting: Internal Medicine

## 2019-06-21 ENCOUNTER — Other Ambulatory Visit: Payer: Self-pay

## 2019-06-21 ENCOUNTER — Ambulatory Visit (INDEPENDENT_AMBULATORY_CARE_PROVIDER_SITE_OTHER): Payer: Medicare Other | Admitting: Family Medicine

## 2019-06-21 ENCOUNTER — Encounter: Payer: Self-pay | Admitting: Family Medicine

## 2019-06-21 VITALS — BP 90/52 | HR 73 | Temp 97.9°F | Ht 68.0 in | Wt 130.2 lb

## 2019-06-21 DIAGNOSIS — E538 Deficiency of other specified B group vitamins: Secondary | ICD-10-CM

## 2019-06-21 DIAGNOSIS — Z0000 Encounter for general adult medical examination without abnormal findings: Secondary | ICD-10-CM

## 2019-06-21 MED ORDER — CYANOCOBALAMIN 1000 MCG/ML IJ SOLN
1000.0000 ug | Freq: Once | INTRAMUSCULAR | Status: AC
  Administered 2019-06-21: 1000 ug via INTRAMUSCULAR

## 2019-06-21 NOTE — Progress Notes (Signed)
[name] [name] Nation, MD [facility] [place], [address] Phone: [phone]  FAX: [place] - 86 y.o. male  MRN [id number]  Date of Birth: [birth year]  Visit Date: 06/21/2019  PCP: Owens Loffler, MD  Referred by: Owens Loffler, MD  Chief Complaint  Patient presents with  . Annual Exam    Part 2    This visit occurred during the SARS-CoV-2 public health emergency.  Safety protocols were in place, including screening questions prior to the visit, additional usage of staff PPE, and extensive cleaning of exam room while observing appropriate contact time as indicated for disinfecting solutions.   Patient Care Team: Owens Loffler, MD as PCP - General (Family Medicine) Deboraha Sprang, MD as PCP - Cardiology (Cardiology) Knox Royalty, RN as [facility] Subjective:   [name] [name] is a 86 y.o. pleasant patient who presents with the following:  Preventative Health Maintenance Visit:  Health Maintenance Summary Reviewed and updated, unless pt declines services.  Tobacco History Reviewed. Alcohol: No concerns, no excessive use Exercise Habits: Some activity, rec at least 30 mins 5 times a week STD concerns: no risk or activity to increase risk Drug Use: None  Using his walker now most all the time. Hip doing well.  He did fall and have a traumatic hip fracture in the fall.  Since then he is now using a a walker, particular at home which has a much of curves and angles.  His daughter is doing his driving and she spends the night is his house now.  Does not take iron pill every day. Takes a lot.  He thinks that it does constipate him some.  Wt Readings from Last 3 Encounters:  06/21/19 130 lb 4 oz (59.1 kg)  03/16/19 128 lb 8 oz (58.3 kg)  02/15/19 179 lb (81.2 kg)    3 times a day. Takes two thirty g ensures On top of  meals  Health Maintenance  Topic Date Due  . TETANUS/TDAP  04/07/2027  . INFLUENZA VACCINE  Completed  . PNA vac Low Risk Adult  Completed   Immunization History  Administered Date(s) Administered  . Fluad Quad(high Dose 65+) 01/04/2019  . H1N1 05/09/2008  . Influenza Split 02/10/2011  . Influenza Whole 01/15/2009, 01/13/2010  . Influenza, High Dose Seasonal PF 12/21/2012, 02/23/2017  . Influenza,inj,Quad PF,6+ Mos 01/08/2014, 01/25/2015, 01/06/2018  . PFIZER SARS-COV-2 Vaccination 04/19/2019, 05/13/2019  . Pneumococcal Conjugate-13 12/14/2013  . Pneumococcal Polysaccharide-23 03/31/2003, 03/30/2005  . Td 03/31/2003, 04/06/2017  . Zoster 02/06/2008   Patient Active Problem List   Diagnosis Date Noted  . PACEMAKER, PERMANENT 08/07/2008    Priority: Medium  . AF (paroxysmal atrial fibrillation) ([place]) 02/06/2008    Priority: Medium  . Age-related osteoporosis with current pathological fracture with routine healing 02/21/2019  . Severe protein-calorie malnutrition ([place]) 02/21/2019  . Closed fracture of neck of right femur ([place])   . Longstanding persistent atrial fibrillation ([place])   . Warfarin anticoagulation   . Closed right hip fracture, initial encounter ([place]) 02/14/2019  . Benign prostatic hyperplasia   . Glaucoma   . Chronic renal failure, stage 3b 04/13/2017  . DDD (degenerative disc disease), cervical, Severe 06/09/2013  . Neck arthritis, Severe, multi-level 06/09/2013  . Sinoatrial node dysfunction ([name])   . Long term (current) use of anticoagulants 06/24/2010  . IRON DEFICIENCY 04/17/2010  . GLAUCOMA 08/07/2008  .  GERD 08/07/2008  . Vitamin B 12 deficiency 07/19/2008  . PEPTIC ULCER DISEASE 07/18/2008  . RADIATION PROCTITIS 07/18/2008  . DIVERTICULOSIS, COLON 07/18/2008  . Prostate cancer, in remission 07/18/2008  . Essential hypertension 05/09/2008  . Hyperlipidemia 02/06/2008  . Hypothyroidism 12/01/2007  . HERN UNS SITE ABD CAV W/O MENTION OBST/GANGREN  12/01/2007    Past Medical History:  Diagnosis Date  . AICD (automatic cardioverter/defibrillator) present    PACEMAKER  . Anemia   . Cancer ([place])   . Cardiac pacemaker in situ 07/2005   symptomatic bradycardia  . CKD (chronic kidney disease) stage 3, GFR 30-59 ml/min 04/13/2017  . Diverticulosis of colon (without mention of hemorrhage)   . Dysrhythmia   . 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 GERD (gastroesophageal reflux disease)   . GI hemorrhage   . Hyperlipidemia   . Hypertension   . Lower extremity edema   . Neck arthritis, Severe, multi-level 06/09/2013  . Paroxysmal atrial fibrillation ([name])    s/p failed ablation  . Peptic ulcer, unspecified site, unspecified as acute or chronic, without mention of hemorrhage, perforation, or obstruction 1975  . Personal history of malignant neoplasm of prostate 12/2000   5 weeks radiation, radiation seeds  . Radiation proctitis   . Sinoatrial node dysfunction ([name])   . Unspecified glaucoma(365.9)   . Unspecified hypothyroidism   . Vitamin B12 deficiency     Past Surgical History:  Procedure Laterality Date  . CARDIAC CATHETERIZATION    . CARDIAC ELECTROPHYSIOLOGY [place] AND ABLATION  2001, 2003   failure  . CATARACT EXTRACTION    . CYSTOGRAM N/A 09/21/2018   Procedure: CYSTOGRAM;  Surgeon: Abbie Sons, MD;  Location: [facility] ORS;  Service: Urology;  Laterality: N/A;  . CYSTOSCOPY WITH URETHRAL DILATATION N/A 01/29/2017   Procedure: CYSTOSCOPY WITH URETHRAL DILATATION;  Surgeon: Abbie Sons, MD;  Location: [facility] ORS;  Service: Urology;  Laterality: N/A;  . CYSTOSCOPY WITH URETHRAL DILATATION N/A 09/21/2018   Procedure: CYSTOSCOPY WITH URETHRAL DILATATION;  Surgeon: Abbie Sons, MD;  Location: [facility] ORS;  Service: Urology;  Laterality: N/A;  . EP IMPLANTABLE DEVICE N/A 02/24/2016   Procedure: PPM Generator Changeout;  Surgeon: Deboraha Sprang, MD;   Location: [facility];  Service: Cardiovascular;  Laterality: N/A;  . gastric ulcer surgery    . HERNIA REPAIR  1994, 2001, 2003   s/p drainage and complications, 09/3530, 11/9240 mesh removal  . HIP ARTHROPLASTY Right 02/16/2019   Procedure: RIGHT ANTERIOR HIP HEMIARTHROPLASTY,CEMENTED;  Surgeon: Hessie Knows, MD;  Location: [facility] ORS;  Service: Orthopedics;  Laterality: Right;  . INSERT / REPLACE / REMOVE PACEMAKER  07/2005   symptomatic bradycardia  . kidney stones    . NOSE SURGERY     cancer removed   . PROSTATE SURGERY    . SKIN CANCER DESTRUCTION    . TOOTH EXTRACTION      Family History  Problem Relation Age of Onset  . Breast cancer Mother   . Bone cancer Father   . Alcohol abuse Other   . Coronary artery disease Other   . Dementia Other   . Diabetes Other     Past Medical History, Surgical History, Social History, Family History, Problem List, Medications, and Allergies have been reviewed and updated if relevant.  Review of Systems: Pertinent positives are listed above.  Otherwise, a full 14 point review of systems has been done  in full and it is negative except where it is noted positive.  Objective:   BP (!) 90/52   Pulse 73   Temp 97.9 F (36.6 C) (Temporal)   Ht 5' 8"  (1.727 m)   Wt 130 lb 4 oz (59.1 kg)   SpO2 99%   BMI 19.80 kg/m  Ideal Body Weight: Weight in (lb) to have BMI = 25: 164.1  Ideal Body Weight: Weight in (lb) to have BMI = 25: 164.1 No exam data present Depression screen [facility] 2/9 04/12/2019 03/16/2019 04/08/2018 04/06/2017 03/18/2016  Decreased Interest 0 0 0 0 0  Down, Depressed, Hopeless 0 0 0 0 0  PHQ - 2 Score 0 0 0 0 0  Altered sleeping 0 - 0 0 -  Tired, decreased energy 0 - 0 0 -  Change in appetite 0 - 0 0 -  Feeling bad or failure about yourself  0 - 0 0 -  Trouble concentrating 0 - 0 0 -  Moving slowly or fidgety/restless 0 - 0 0 -  Suicidal thoughts 0 - 0 0 -  PHQ-9 Score 0 - 0 0 -  Difficult doing work/chores Not  difficult at all - Not difficult at all Not difficult at all -  Some recent data might be hidden     GEN: well developed, well nourished, no acute distress Eyes: conjunctiva and lids normal, PERRLA, EOMI ENT: TM clear, nares clear, oral exam WNL Neck: supple, no lymphadenopathy, no thyromegaly, no JVD Pulm: clear to auscultation and percussion, respiratory effort normal CV: regular rate and rhythm, S1-S2, no murmur, rub or gallop, no bruits, peripheral pulses normal and symmetric, no cyanosis, clubbing, edema or varicosities GI: soft, non-tender; no hepatosplenomegaly, masses; active bowel sounds all quadrants GU: Defer  lymph: no cervical, axillary or inguinal adenopathy MSK: gait normal, muscle tone and strength WNL, no joint swelling, effusions, discoloration, crepitus  SKIN: clear, good turgor, color WNL, no rashes, lesions, or ulcerations Neuro: normal mental status, normal strength, sensation, and motion Psych: alert; oriented to person, place and time, normally interactive and not anxious or depressed in appearance.  All labs reviewed with patient. Lab Review:  CBC EXTENDED Latest Ref Rng & Units 04/12/2019 02/19/2019 02/18/2019  WBC 4.0 - 10.5 K/uL 5.5 8.2 9.6  RBC 4.22 - 5.81 Mil/uL 3.10(L) 2.66(L) 2.53(L)  HGB 13.0 - 17.0 g/dL 10.4(L) 8.5(L) 8.1(L)  HCT 39.0 - 52.0 % 31.7(L) 27.0(L) 26.0(L)  PLT 150.0 - 400.0 K/uL 217.0 191 159  NEUTROABS 1.4 - 7.7 K/uL 3.1 - -  LYMPHSABS 0.7 - 4.0 K/uL 1.5 - -    BMP Latest Ref Rng & Units 04/12/2019 02/19/2019 02/18/2019  Glucose 70 - 99 mg/dL 107(H) 104(H) 100(H)  BUN 6 - 23 mg/dL 71(H) 50(H) 46(H)  Creatinine 0.40 - 1.50 mg/dL 2.00(H) 2.19(H) 2.34(H)  Sodium 135 - 145 mEq/L 139 137 135  Potassium 3.5 - 5.1 mEq/L 4.7 4.7 5.1  Chloride 96 - 112 mEq/L 107 109 108  CO2 19 - 32 mEq/L 27 21(L) 20(L)  Calcium 8.4 - 10.5 mg/dL 8.9 8.6(L) 8.3(L)    Hepatic Function Latest Ref Rng & Units 04/12/2019 02/19/2019 04/08/2018  Total Protein 6.0  - 8.3 g/dL 6.4 5.6(L) 6.4  Albumin 3.5 - 5.2 g/dL 3.6 2.6(L) 3.6  AST 0 - 37 U/L 24 19 25   ALT 0 - 53 U/L 19 6 21   Alk Phosphatase 39 - 117 U/L 94 73 96  Total Bilirubin 0.2 - 1.2 mg/dL 0.5 0.8 0.8  Bilirubin, Direct 0.0 - 0.3 mg/dL 0.1 - 0.2    Lab Results  Component Value Date   CHOL 132 04/08/2018   Lab Results  Component Value Date   HDL 68.50 04/08/2018   Lab Results  Component Value Date   LDLCALC 53 04/08/2018   Lab Results  Component Value Date   TRIG 50.0 04/08/2018   Lab Results  Component Value Date   CHOLHDL 2 04/08/2018   No results for input(s): PSA in the last 72 hours. No results found for: HCVAB Lab Results  Component Value Date   VD25OH 50.75 04/12/2019   VD25OH 55.51 04/08/2018   VD25OH 18.67 (L) 10/08/2017     Lab Results  Component Value Date   HGBA1C 6.2 07/20/2008   Lab Results  Component Value Date   LDLCALC 53 04/08/2018   CREATININE 2.00 (H) 04/12/2019    Assessment and Plan:     ICD-10-CM   1. Healthcare maintenance  Z00.00   2. Vitamin B 12 deficiency  E53.8 cyanocobalamin ((VITAMIN B-12)) injection 1,000 mcg   He is doing globally quite well at age 15.  Mental faculties are entirely intact.  Health Maintenance Exam: The patient's preventative maintenance and recommended screening tests for an annual wellness exam were reviewed in full today. Brought up to date unless services declined.  Counselled on the importance of diet, exercise, and its role in overall health and mortality. The patient's FH and SH was reviewed, including their home life, tobacco status, and drug and alcohol status.  Follow-up in 1 year for physical exam or additional follow-up below.  Follow-up: No follow-ups on file. Or follow-up in 1 year if not noted.  Meds ordered this encounter  Medications  . cyanocobalamin ((VITAMIN B-12)) injection 1,000 mcg   There are no discontinued medications. No orders of the defined types were placed in this  encounter.   Signed,  [name]. Seve Monette, MD   Allergies as of 06/21/2019      Reactions   Penicillins Rash, Other (See Comments)   Has patient had a PCN reaction causing immediate rash, facial/tongue/throat swelling, SOB or lightheadedness with hypotension: {no Has patient had a PCN reaction causing severe rash involving mucus membranes or skin necrosis: {no Has patient had a PCN reaction that required hospitalization {no Has patient had a PCN reaction occurring within the last 10 years: {no If all of the above answers are "NO", then may proceed with Cephalosporin use.      Medication List       Accurate as of June 21, 2019  2:59 PM. If you have any questions, ask your nurse or doctor.        acetaminophen 500 MG tablet Commonly known as: TYLENOL Take 500 mg by mouth every 6 (six) hours as needed for moderate pain or headache.   alendronate 70 MG tablet Commonly known as: FOSAMAX TAKE 1 TABLET BY MOUTH EVERY 7 DAYS WITH A FULL GLASS OF WATER ON AN EMPTY STOMACH   amiodarone 200 MG tablet Commonly known as: PACERONE TAKE 1/2 TABLET(100 MG) BY MOUTH DAILY   BENEFIBER DRINK MIX PO Take 1 scoop by mouth daily.   docusate sodium 50 MG capsule Commonly known as: COLACE Take 50 mg by mouth 2 (two) times daily as needed for mild constipation.   dorzolamide-timolol 22.3-6.8 MG/ML ophthalmic solution Commonly known as: COSOPT Place 1 drop into both eyes 2 (two) times daily.   ferrous sulfate 325 (65 FE) MG EC tablet Take 325 mg by  mouth daily with breakfast.   furosemide 20 MG tablet Commonly known as: LASIX Take 2 tablets (40 mg) by mouth once every other day as directed   levothyroxine 100 MCG tablet Commonly known as: SYNTHROID TAKE 1 TABLET BY MOUTH DAILY BEFORE BREAKFAST   loperamide 2 MG capsule Commonly known as: IMODIUM Take 2 mg by mouth as needed for diarrhea or loose stools.   multivitamin tablet Take 1 tablet by mouth daily.   pantoprazole 40 MG  tablet Commonly known as: PROTONIX TAKE 1 TABLET(40 MG) BY MOUTH DAILY   polyethylene glycol 17 g packet Commonly known as: MIRALAX / GLYCOLAX Take 17 g by mouth daily as needed for moderate constipation.   pravastatin 40 MG tablet Commonly known as: PRAVACHOL Take 0.5 tablets (20 mg total) by mouth at bedtime.   simethicone 125 MG chewable tablet Commonly known as: MYLICON Chew 217 mg by mouth every 6 (six) hours as needed for flatulence.   tamsulosin 0.4 MG Caps capsule Commonly known as: FLOMAX One capsule by mouth daily   VITAMIN B-12 IJ Inject 1,000 mcg as directed every 30 (thirty) days.   warfarin 1 MG tablet Commonly known as: COUMADIN Take as directed by the anticoagulation clinic. If you are unsure how to take this medication, talk to your nurse or doctor. Original instructions: TAKE 1 AND 1/2 TABLETS BY MOUTH DAILY AS DIRECTED

## 2019-06-27 ENCOUNTER — Encounter: Payer: Self-pay | Admitting: Internal Medicine

## 2019-06-27 ENCOUNTER — Other Ambulatory Visit: Payer: Self-pay

## 2019-06-27 ENCOUNTER — Ambulatory Visit (INDEPENDENT_AMBULATORY_CARE_PROVIDER_SITE_OTHER): Payer: Medicare Other | Admitting: Internal Medicine

## 2019-06-27 VITALS — BP 110/60 | HR 72 | Ht 68.0 in | Wt 134.0 lb

## 2019-06-27 DIAGNOSIS — Z950 Presence of cardiac pacemaker: Secondary | ICD-10-CM

## 2019-06-27 DIAGNOSIS — I480 Paroxysmal atrial fibrillation: Secondary | ICD-10-CM

## 2019-06-27 DIAGNOSIS — I495 Sick sinus syndrome: Secondary | ICD-10-CM | POA: Diagnosis not present

## 2019-06-27 NOTE — Progress Notes (Signed)
Electrophysiology Office Note    Date:  06/27/2019   ID:  [name] [name], DOB 1923/02/28, MRN [id number]  PCP:  Owens Loffler, MD  Cardiologist:  Primary Electrophysiologist:  Virl Axe, MD    Chief Complaint  Patient presents with  . other    12 month follow up. Meds reviewed verbally with patient.      History of Present Illness: [name] [name] is a 86 y.o. male is seen today in followup for  pacemaker implanted for tachybradycardia syndrome in  the context of paroxysmal atrial fibrillation with prior failed ablation. He takes amiodarone. He has had mostly sinus ( 97%) from interrogation 7/17  He underwent device generator replacement 11/17   The patient denies chest pain, shortness of breath, nocturnal dyspnea, orthopnea or peripheral edema.  There have been no palpitations, lightheadedness or syncope.  n  [place] and broke his hip in November.  Ambulating better now. Date Cr K TSH LFTs Hgb PFTs  7/17    0.71      8/18     12.3   1/19    0.3 15 10.5    8/19 1.93 3.8 0.21  10.4   1/21 2.0 4.7 2.01 19 10.4        Past Medical History:  Diagnosis Date  . AICD (automatic cardioverter/defibrillator) present    PACEMAKER  . Anemia   . Cancer ([place])   . Cardiac pacemaker in situ 07/2005   symptomatic bradycardia  . CKD (chronic kidney disease) stage 3, GFR 30-59 ml/min 04/13/2017  . Diverticulosis of colon (without mention of hemorrhage)   . Dysrhythmia   . 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 GERD (gastroesophageal reflux disease)   . GI hemorrhage   . Hyperlipidemia   . Hypertension   . Lower extremity edema   . Neck arthritis, Severe, multi-level 06/09/2013  . Paroxysmal atrial fibrillation ([name])    s/p failed ablation  . Peptic ulcer, unspecified site, unspecified as acute or chronic, without mention of hemorrhage, perforation, or obstruction 1975  . Personal history of malignant neoplasm  of prostate 12/2000   5 weeks radiation, radiation seeds  . Radiation proctitis   . Sinoatrial node dysfunction ([name])   . Unspecified glaucoma(365.9)   . Unspecified hypothyroidism   . Vitamin B12 deficiency    Past Surgical History:  Procedure Laterality Date  . CARDIAC CATHETERIZATION    . CARDIAC ELECTROPHYSIOLOGY [place] AND ABLATION  2001, 2003   failure  . CATARACT EXTRACTION    . CYSTOGRAM N/A 09/21/2018   Procedure: CYSTOGRAM;  Surgeon: Abbie Sons, MD;  Location: [facility] ORS;  Service: Urology;  Laterality: N/A;  . CYSTOSCOPY WITH URETHRAL DILATATION N/A 01/29/2017   Procedure: CYSTOSCOPY WITH URETHRAL DILATATION;  Surgeon: Abbie Sons, MD;  Location: [facility] ORS;  Service: Urology;  Laterality: N/A;  . CYSTOSCOPY WITH URETHRAL DILATATION N/A 09/21/2018   Procedure: CYSTOSCOPY WITH URETHRAL DILATATION;  Surgeon: Abbie Sons, MD;  Location: [facility]S;  Service: Urology;  Laterality: N/A;  . EP IMPLANTABLE DEVICE N/A 02/24/2016   Procedure: PPM Generator Changeout;  Surgeon: Deboraha Sprang, MD;  Location: [facility];  Service: Cardiovascular;  Laterality: N/A;  . gastric ulcer surgery    . HERNIA REPAIR  1994, 2001, 2003   s/p drainage and complications, 0/6237, 08/2829 mesh removal  . HIP ARTHROPLASTY Right 02/16/2019   Procedure: RIGHT ANTERIOR HIP HEMIARTHROPLASTY,CEMENTED;  Surgeon: Hessie Knows, MD;  Location: [facility] ORS;  Service: Orthopedics;  Laterality: Right;  . INSERT / REPLACE / REMOVE PACEMAKER  07/2005   symptomatic bradycardia  . kidney stones    . NOSE SURGERY     cancer removed   . PROSTATE SURGERY    . SKIN CANCER DESTRUCTION    . TOOTH EXTRACTION       Current Outpatient Medications  Medication Sig Dispense Refill  . acetaminophen (TYLENOL) 500 MG tablet Take 500 mg by mouth every 6 (six) hours as needed for moderate pain or headache.    . alendronate (FOSAMAX) 70 MG tablet TAKE 1 TABLET BY MOUTH EVERY 7 DAYS WITH A FULL GLASS OF WATER ON  AN EMPTY STOMACH 12 tablet 3  . amiodarone (PACERONE) 200 MG tablet TAKE 1/2 TABLET(100 MG) BY MOUTH DAILY 45 tablet 0  . Cyanocobalamin (VITAMIN B-12 IJ) Inject 1,000 mcg as directed every 30 (thirty) days.     [place] docusate sodium (COLACE) 50 MG capsule Take 50 mg by mouth 2 (two) times daily as needed for mild constipation.    . dorzolamide-timolol (COSOPT) 22.3-6.8 MG/ML ophthalmic solution Place 1 drop into both eyes 2 (two) times daily.      . ferrous sulfate 325 (65 FE) MG EC tablet Take 325 mg by mouth daily with breakfast.      . furosemide (LASIX) 20 MG tablet Take 2 tablets (40 mg) by mouth once every other day as directed 90 tablet 1  . levothyroxine (SYNTHROID) 100 MCG tablet TAKE 1 TABLET BY MOUTH DAILY BEFORE BREAKFAST 90 tablet 3  . loperamide (IMODIUM) 2 MG capsule Take 2 mg by mouth as needed for diarrhea or loose stools.    . Multiple Vitamin (MULTIVITAMIN) tablet Take 1 tablet by mouth daily.      . pantoprazole (PROTONIX) 40 MG tablet TAKE 1 TABLET(40 MG) BY MOUTH DAILY 90 tablet 0  . polyethylene glycol (MIRALAX / GLYCOLAX) packet Take 17 g by mouth daily as needed for moderate constipation.     . pravastatin (PRAVACHOL) 40 MG tablet Take 0.5 tablets (20 mg total) by mouth at bedtime. 45 tablet 0  . simethicone (MYLICON) 564 MG chewable tablet Chew 125 mg by mouth every 6 (six) hours as needed for flatulence.    . tamsulosin (FLOMAX) 0.4 MG CAPS capsule One capsule by mouth daily 90 capsule 0  . warfarin (COUMADIN) 1 MG tablet TAKE 1 AND 1/2 TABLETS BY MOUTH DAILY AS DIRECTED 45 tablet 2  . Wheat Dextrin (BENEFIBER DRINK MIX PO) Take 1 scoop by mouth daily.      No current facility-administered medications for this visit.    Allergies:   Penicillins   Social History:  The patient  reports that he has never smoked. He has never used smokeless tobacco. He reports that he does not drink alcohol or use drugs.   Family History:  The patient's    family history includes Alcohol  abuse in an other family member; Bone cancer in his father; Breast cancer in his mother; Coronary artery disease in an other family member; Dementia in an other family member; Diabetes in an other family member.    ROS:  Please see the history of present illness and past medical histor .   All other systems are reviewed and negative.    PHYSICAL EXAM: VS:  BP 110/60 (BP Location: Left Arm, Patient Position: Sitting, Cuff Size: Normal)   Pulse 72   Ht 5\' 8"  (1.727 m)  Wt 134 lb (60.8 kg)   SpO2 93%   BMI 20.37 kg/m  , BMI Body mass index is 20.37 kg/m. Well developed and well nourished in no acute distress HENT normal Neck supple with JVP-flat Clear Device pocket well healed; without hematoma or erythema.  There is no tethering; skin thin with the lead protruding Regular rate and rhythm, no  gallop No murmur Abd-soft with active BS No Clubbing cyanosis left greater than right edema Skin-warm and dry A & Oriented  Grossly normal sensory and motor function  ECG atrial fibrillation at 72 Interval-/09/39 Occasional PVC    ASSESSMENT AND PLAN:  Atrial fibrillation - paroxysmal  Sinus node dysfunction   Pacemaker  [employer]tronic   The patient's device was interrogated.  The information was reviewed. No changes were made in the programming.       Hypothyroidism-treated  High Risk Medication Surveillance Amiodarone therapy  anemia.  Edema-chronic asymmetric  Renal dysfunction gd 3     No evidence of volume overload, not withstanding his edema.  Has been asymmetric for years.  Tolerating amiodarone.    Device detection suggests that his atrial fibrillation burden is relatively low at about 5%.  I am not so sure however, that his atrial fibrillation is generating many symptoms.     Significant sinus node dysfunction is associated with a good histogram response.  He does have first-degree AV block.  It may be appropriate to think about stopping his amiodarone.      Signed, Virl Axe, MD  06/27/2019 10:38 AM     [place] [address] [place] [address] [phone] (office) [phone] (fax)

## 2019-06-27 NOTE — Patient Instructions (Signed)
Medication Instructions:  No changes  *If you need a refill on your cardiac medications before your next appointment, please call your pharmacy*   Lab Work: None ordered   Testing/Procedures: None ordered   Follow-Up: At [facility], you and your health needs are our priority.  As part of our continuing mission to provide you with exceptional heart care, we have created designated Provider Care Teams.  These Care Teams include your primary Cardiologist (physician) and Advanced Practice Providers (APPs -  Physician Assistants and Nurse Practitioners) who all work together to provide you with the care you need, when you need it.  We recommend signing up for the patient portal called "MyChart".  Sign up information is provided on this After Visit Summary.  MyChart is used to connect with patients for Virtual Visits (Telemedicine).  Patients are able to view lab/test results, encounter notes, upcoming appointments, etc.  Non-urgent messages can be sent to your provider as well.   To learn more about what you can do with MyChart, go to [email or web address].    Your next appointment:   6 month(s)  The format for your next appointment:   In Person  Provider:   Virl Axe, MD

## 2019-06-30 IMAGING — US US EXTREM LOW VENOUS BILAT
1 series · 13 of 24 positions shown · non-contrast
Comparison: None.

CLINICAL DATA: Bilateral lower extremity pain and swelling.



[Series 1: us extrem low venous bilat · 0.07mm/px · 13 of 69 slices shown]
[im 1/69]
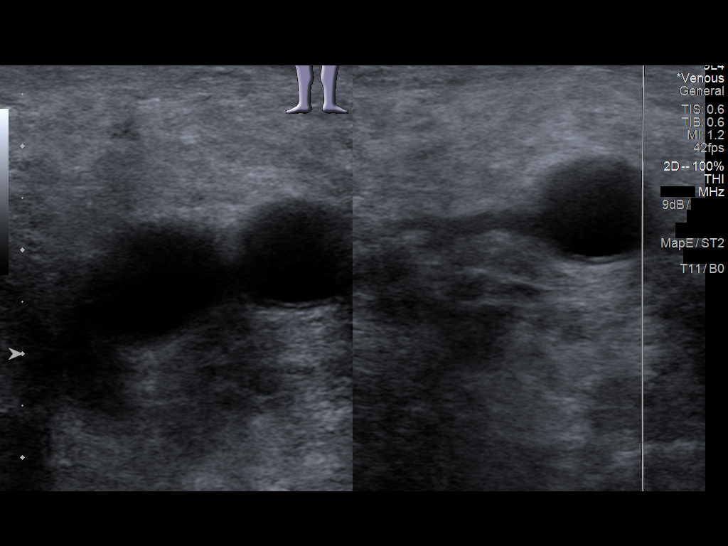
[im 6/69]
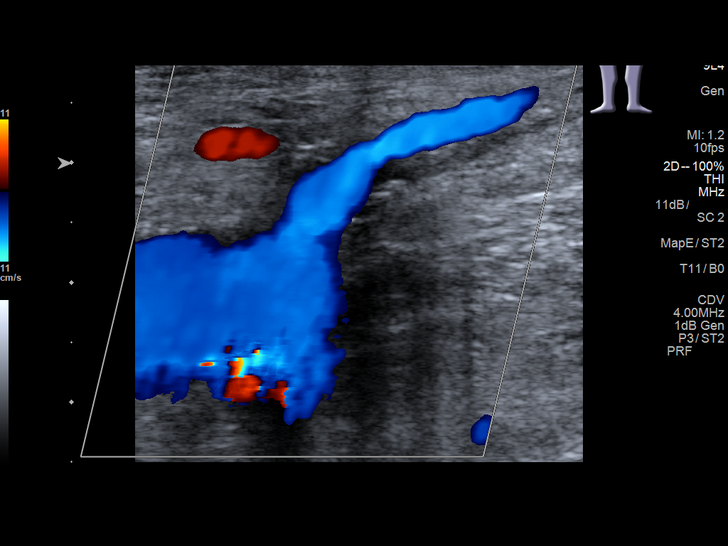
[im 12/69]
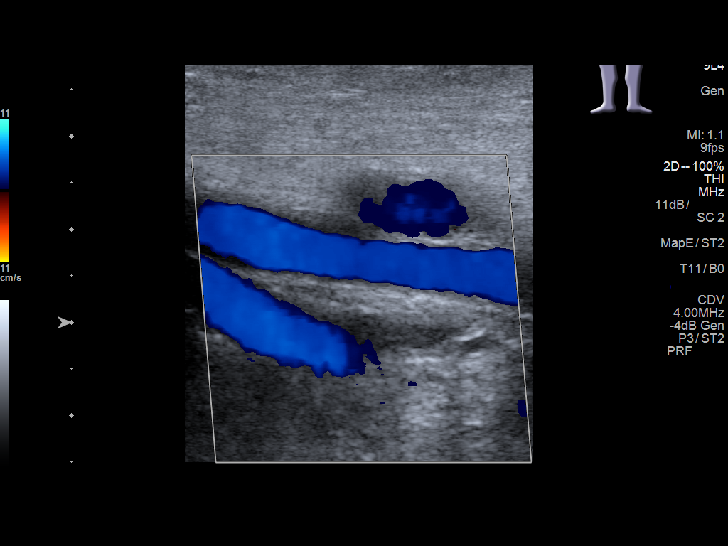
[im 18/69]
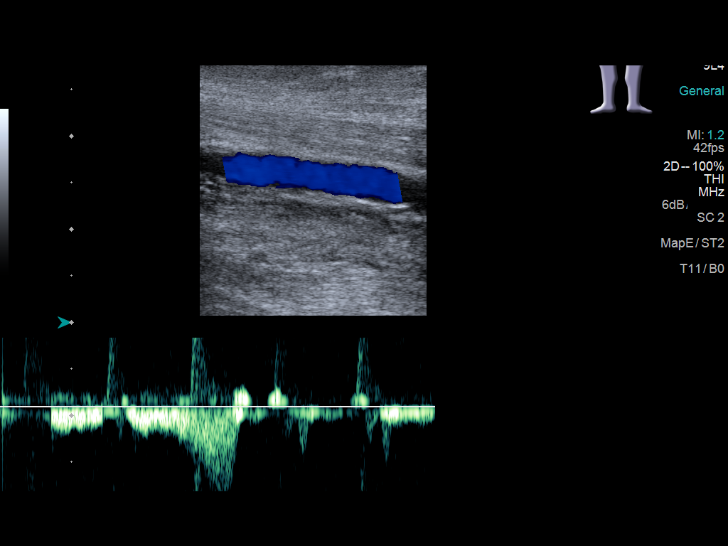
[im 24/69]
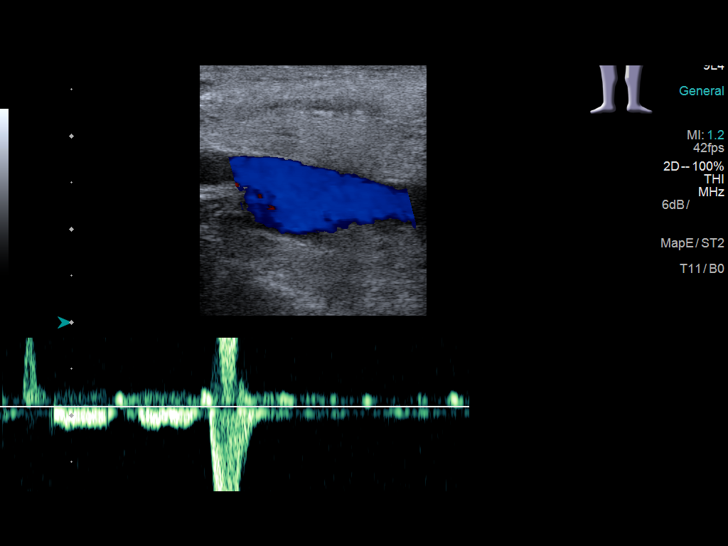
[im 30/69]
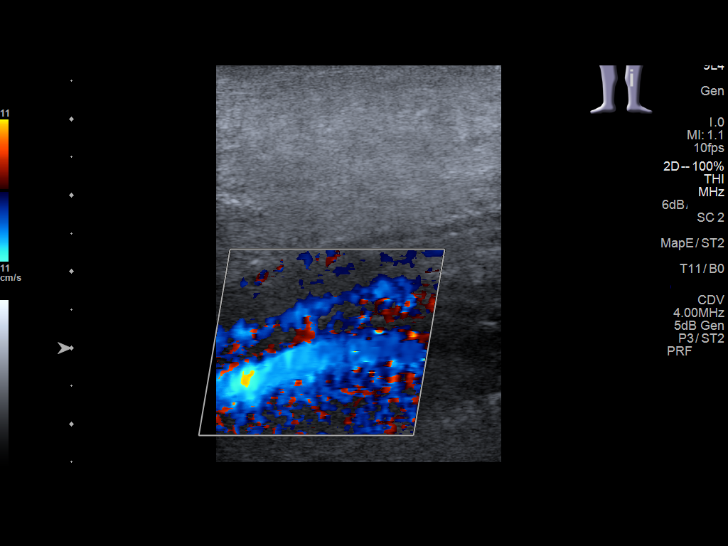
[im 36/69]
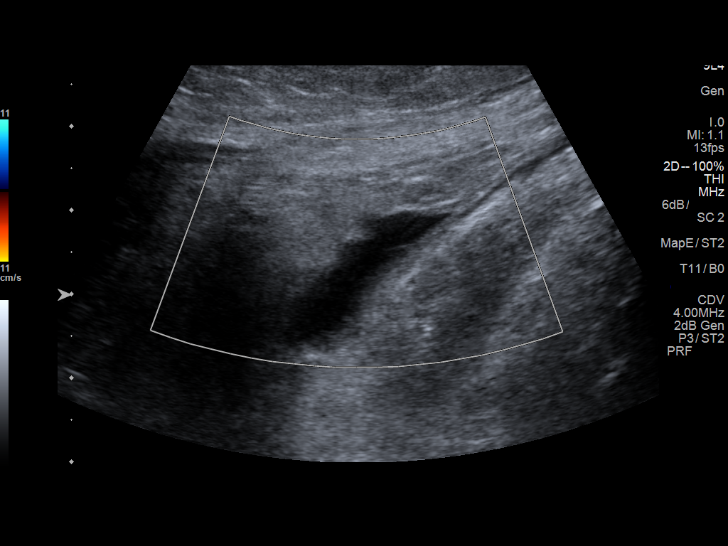
[im 39/69]
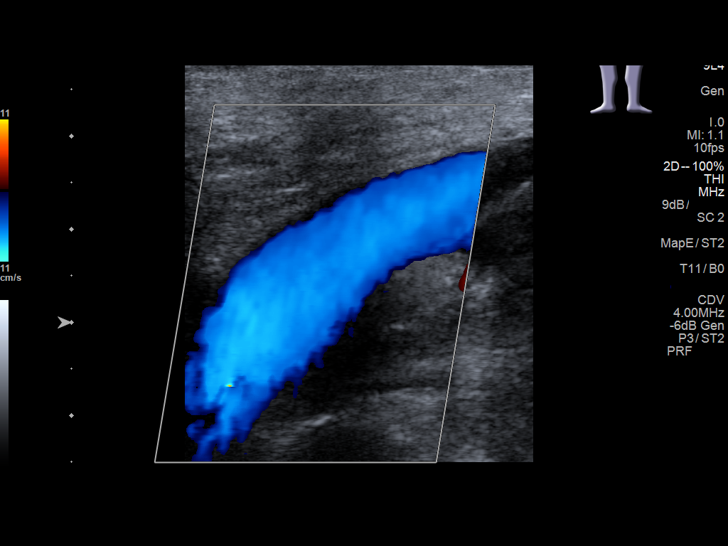
[im 45/69]
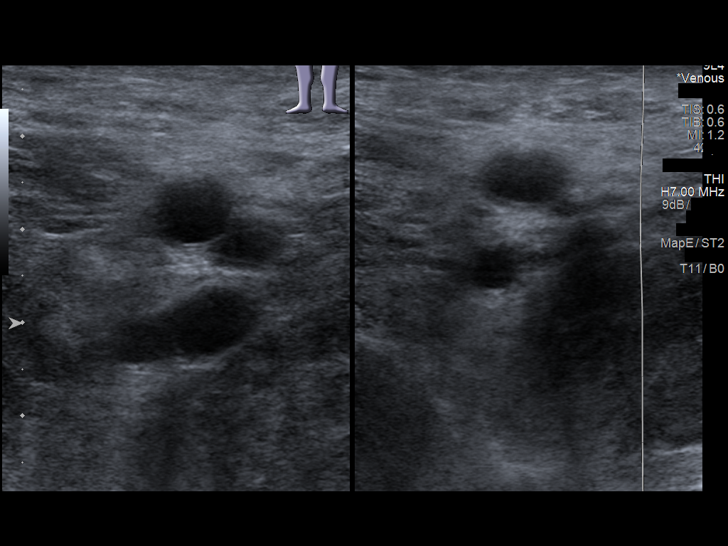
[im 51/69]
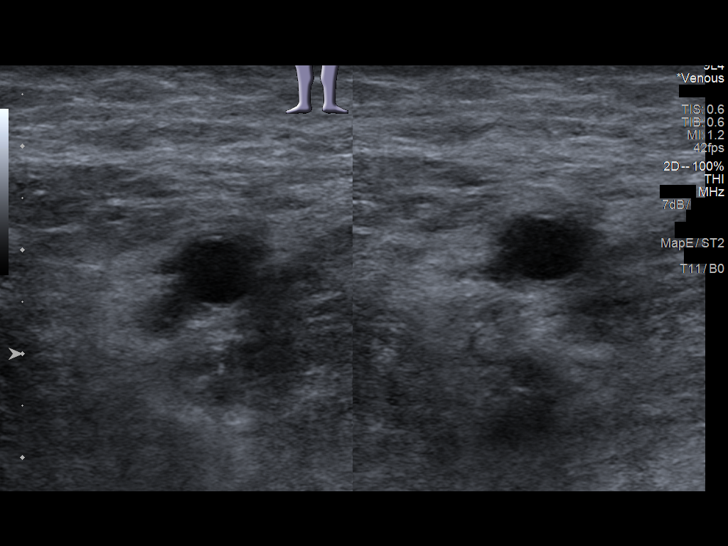
[im 57/69]
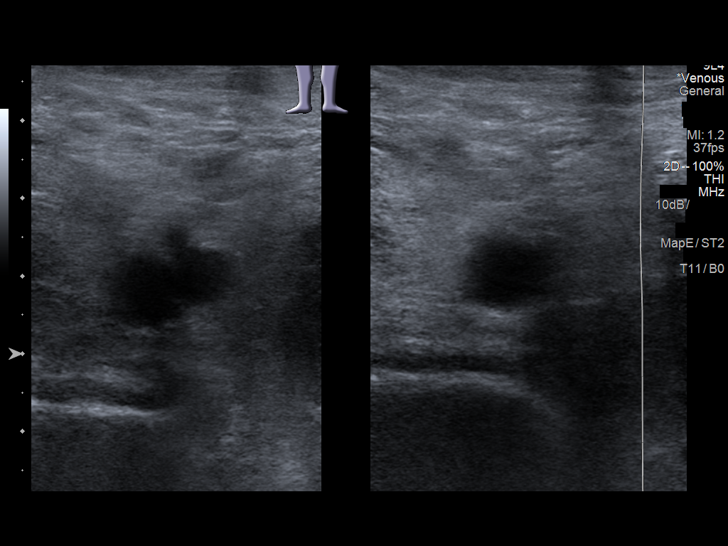
[im 63/69]
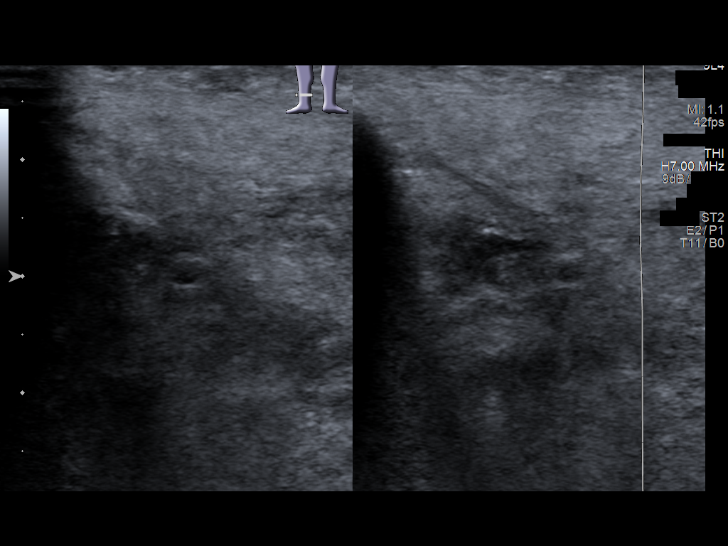
[im 69/69]
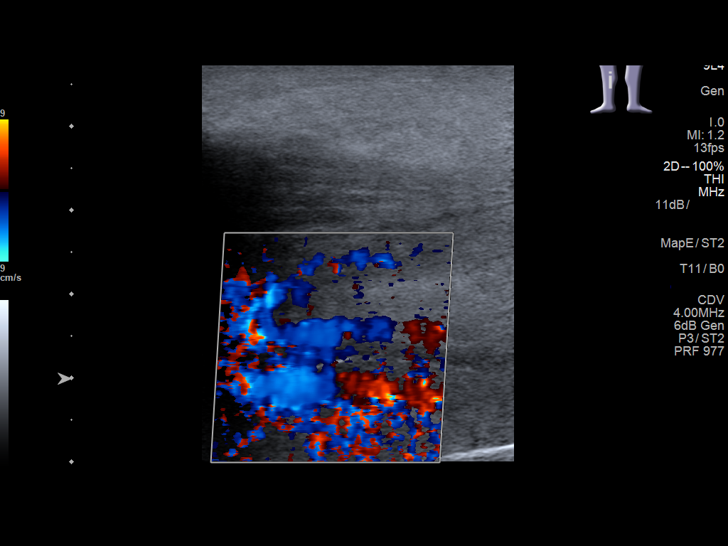

[13 of 24 positions shown; findings below may reference images not displayed]

FINDINGS: RIGHT LOWER EXTREMITY

Common Femoral Vein: No evidence of thrombus. Normal
compressibility, respiratory phasicity and response to augmentation.

Saphenofemoral Junction: No evidence of thrombus. Normal
compressibility and flow on color Doppler imaging.

Profunda Femoral Vein: No evidence of thrombus. Normal
compressibility and flow on color Doppler imaging.

Femoral Vein: No evidence of thrombus. Normal compressibility,
respiratory phasicity and response to augmentation.

Popliteal Vein: No evidence of thrombus. Normal compressibility,
respiratory phasicity and response to augmentation.

Calf Veins: No evidence of thrombus. Normal compressibility and flow
on color Doppler imaging.

Superficial Great Saphenous Vein: No evidence of thrombus. Normal
compressibility and flow on color Doppler imaging.

Venous Reflux:  None.

Other Findings:  None.

LEFT LOWER EXTREMITY

Common Femoral Vein: No evidence of thrombus. Normal
compressibility, respiratory phasicity and response to augmentation.

Saphenofemoral Junction: No evidence of thrombus. Normal
compressibility and flow on color Doppler imaging.

Profunda Femoral Vein: No evidence of thrombus. Normal
compressibility and flow on color Doppler imaging.

Femoral Vein: No evidence of thrombus. Normal compressibility,
respiratory phasicity and response to augmentation.

Popliteal Vein: No evidence of thrombus. Normal compressibility,
respiratory phasicity and response to augmentation.

Calf Veins: No evidence of thrombus. Normal compressibility and flow
on color Doppler imaging.

Superficial Great Saphenous Vein: No evidence of thrombus. Normal
compressibility and flow on color Doppler imaging.

Venous Reflux:  None.

Other Findings: There is a 2.8 x 1.1 x 2.6 cm anechoic fluid
collection in the left popliteal fossa, possibly a Baker cyst.
IMPRESSION: No evidence of DVT within either lower extremity.

## 2019-07-03 ENCOUNTER — Other Ambulatory Visit: Payer: Self-pay | Admitting: *Deleted

## 2019-07-03 ENCOUNTER — Encounter: Payer: Self-pay | Admitting: *Deleted

## 2019-07-03 NOTE — Patient Outreach (Signed)
[facility]) Care Management [facility] CM Telephone Outreach, Case closure- patient successfully accomplished previously established goals  07/03/2019  KAISYN MILLEA [birth year] [id number]  [place], 86 y/o male referred to [facility] CM 03/14/2019 by [facility] RN PACfor transition of careafter patient experienced recent hospitalization November 17-23, 2020 for mechanical fall at home with (R) hip fracture; patient had (R) hip arthroplasty and was discharged to SNF for short- term rehabilitation. Patient was subsequently discharged from [facility] 12/11/2020to home/ self-care with home health services in placewhich have now ended.Patient has history including, but not limited to, HTN/ HLD; A-fib, on ACT; previous pacemaker insertion; GERD; DDD; CKD- stage IV; and BPH.  HIPAA/ identity verified with patient and his daughter/ caregiver, whom patient lives with; both deny current clinical concerns and report patient "still doing great."  Deny changes to medications, pain, new/ recent falls; confirms that patient continues using assistive devices for ambulation.  Patient sounds to be in no distress throughout phone call.    Reports that he attended scheduled PCP office visit on June 21, 2019 and confirms that his daughter/ caregiver will continue to provide transportation to all provider office visits.  Also attended recent cardiology office visit for pacemaker check- confirms that no problems/ issues/ concerns were identified at either office visit.  Discussed with patient and caregiver signs/ symptoms CVA/ MI and corresponding action plan for both; discussed general reasons to contact care providers vs. Seek emergent/ urgent care and both patient and caregiver verbalize appropriate understanding of same.  Discussed with patient and caregiver that patient has successfully met his previously established [facility] CM goals and confirmed that neither patient  nor caregiver have additional care coordination/ care management goals-- both verbalize agreement with [facility] CM case closure today.  Confirmed that patient and caregiver both have my direct number and the [facility] CM main office number should needs arise in the future.   Plan:  Will close [place] CM case, as patient has successfully accomplished his previously stated [facility] CM goals and denies ongoing care coordination/ care management needs and will make patient's PCP aware of same- will send case closure note  [facility] CM Care Plan Problem Two     Most Recent Value  Care Plan Problem Two  High Risk for falls as evidenced by history of fall with injury  Role Documenting the Problem Two  Care Management Coordinator  Care Plan for Problem Two  Not Active  Interventions for Problem Two Long Term Goal   Discussed current clinical condition with patient and caregiver/ daughter and confirmed that neither have any current clinical concerns,  confirmed that patient has experienced no falls post-SNF discharge and that he continues using walker for all ambulation,  positive reinforcement provided,  reviewed signs/ symptoms CVA and MI with patient and caregiver along with corresponding action plan  [facility] Long Term Goal  Over the next 35 days, patient will not experience any new falls, as evidenced by patient/ caregiver reporting during [facility] RN CM outreach  [facility] Long Term Goal Start Date  05/29/19  [facility] Long Term Goal Met Date  07/03/19 [Goal Met]  [facility] CM Short Term Goal #2   Over the next 30 days, patient will attend regularly scheduled PCP office visit, as evidenced by patient and caregiver reporting and review of same during [facility] RN CM outreach  [facility] CM Short Term Goal #2 Start Date  05/29/19  [facility] CM Short Term Goal #2 Met Date  07/03/19 [Goal Met]  Interventions for  Short Term Goal #2  Confirmed that patient attended recent PCP and cardiologist office visits and reviewed both visits with patient and caregiver,   confirmed that no changes were made with patient's medications and that patient has ongoing reliable transportation to all provider office visits through family who live with him at his home     It has been a pleasure caring for Mr. Farren, Nelles Jamse Arn, RN, BSN, [place] Coordinator [facility] Care Management  [phone]

## 2019-07-10 ENCOUNTER — Ambulatory Visit (INDEPENDENT_AMBULATORY_CARE_PROVIDER_SITE_OTHER): Payer: Medicare Other

## 2019-07-10 ENCOUNTER — Other Ambulatory Visit: Payer: Self-pay

## 2019-07-10 DIAGNOSIS — I480 Paroxysmal atrial fibrillation: Secondary | ICD-10-CM

## 2019-07-10 LAB — POCT INR: INR: 2.2 (ref 2.0–3.0)

## 2019-07-10 NOTE — Patient Instructions (Signed)
-   continue warfarin dosage of 1.5 tablets every day EXCEPT 1 tablet on Mondays & Fridays. - recheck in 5 weeks.

## 2019-07-14 LAB — CUP PACEART INCLINIC DEVICE CHECK
Battery Remaining Longevity: 81 mo
Battery Voltage: 3.01 V
Brady Statistic AP VP Percent: 3.56 %
Brady Statistic AP VS Percent: 86.53 %
Brady Statistic AS VP Percent: 1.84 %
Brady Statistic AS VS Percent: 8.08 %
Brady Statistic RA Percent Paced: 88.79 %
Brady Statistic RV Percent Paced: 5.59 %
Date Time Interrogation Session: 20210330103400
Implantable Lead Implant Date: 20070525
Implantable Lead Implant Date: 20070525
Implantable Lead Location: 753859
Implantable Lead Location: 753860
Implantable Lead Model: 5076
Implantable Lead Model: 5076
Implantable Pulse Generator Implant Date: 20171127
Lead Channel Impedance Value: 361 Ohm
Lead Channel Impedance Value: 380 Ohm
Lead Channel Impedance Value: 399 Ohm
Lead Channel Impedance Value: 399 Ohm
Lead Channel Pacing Threshold Amplitude: 0.75 V
Lead Channel Pacing Threshold Pulse Width: 0.4 ms
Lead Channel Sensing Intrinsic Amplitude: 0.75 mV
Lead Channel Sensing Intrinsic Amplitude: 3.5 mV
Lead Channel Setting Pacing Amplitude: 2 V
Lead Channel Setting Pacing Amplitude: 2.5 V
Lead Channel Setting Pacing Pulse Width: 0.4 ms
Lead Channel Setting Sensing Sensitivity: 0.6 mV

## 2019-07-17 ENCOUNTER — Other Ambulatory Visit: Payer: Self-pay | Admitting: Family Medicine

## 2019-07-17 ENCOUNTER — Other Ambulatory Visit: Payer: Self-pay | Admitting: Internal Medicine

## 2019-07-25 ENCOUNTER — Ambulatory Visit (INDEPENDENT_AMBULATORY_CARE_PROVIDER_SITE_OTHER): Payer: Medicare Other | Admitting: *Deleted

## 2019-07-25 DIAGNOSIS — I495 Sick sinus syndrome: Secondary | ICD-10-CM | POA: Diagnosis not present

## 2019-07-25 LAB — CUP PACEART REMOTE DEVICE CHECK
Battery Remaining Longevity: 82 mo
Battery Voltage: 3.01 V
Brady Statistic AP VP Percent: 1.75 %
Brady Statistic AP VS Percent: 86.8 %
Brady Statistic AS VP Percent: 5.51 %
Brady Statistic AS VS Percent: 5.94 %
Brady Statistic RA Percent Paced: 86.71 %
Brady Statistic RV Percent Paced: 7.4 %
Date Time Interrogation Session: 20210427093608
Implantable Lead Implant Date: 20070525
Implantable Lead Implant Date: 20070525
Implantable Lead Location: 753859
Implantable Lead Location: 753860
Implantable Lead Model: 5076
Implantable Lead Model: 5076
Implantable Pulse Generator Implant Date: 20171127
Lead Channel Impedance Value: 361 Ohm
Lead Channel Impedance Value: 380 Ohm
Lead Channel Impedance Value: 399 Ohm
Lead Channel Impedance Value: 418 Ohm
Lead Channel Pacing Threshold Amplitude: 0.75 V
Lead Channel Pacing Threshold Amplitude: 0.75 V
Lead Channel Pacing Threshold Pulse Width: 0.4 ms
Lead Channel Pacing Threshold Pulse Width: 0.4 ms
Lead Channel Sensing Intrinsic Amplitude: 1.25 mV
Lead Channel Sensing Intrinsic Amplitude: 1.25 mV
Lead Channel Sensing Intrinsic Amplitude: 1.625 mV
Lead Channel Sensing Intrinsic Amplitude: 1.625 mV
Lead Channel Setting Pacing Amplitude: 2 V
Lead Channel Setting Pacing Amplitude: 2.5 V
Lead Channel Setting Pacing Pulse Width: 0.4 ms
Lead Channel Setting Sensing Sensitivity: 0.6 mV

## 2019-07-26 NOTE — Progress Notes (Signed)
PPM Remote  

## 2019-08-14 ENCOUNTER — Ambulatory Visit (INDEPENDENT_AMBULATORY_CARE_PROVIDER_SITE_OTHER): Payer: Medicare Other

## 2019-08-14 ENCOUNTER — Other Ambulatory Visit: Payer: Self-pay

## 2019-08-14 DIAGNOSIS — Z5181 Encounter for therapeutic drug level monitoring: Secondary | ICD-10-CM | POA: Diagnosis not present

## 2019-08-14 DIAGNOSIS — I480 Paroxysmal atrial fibrillation: Secondary | ICD-10-CM

## 2019-08-14 LAB — POCT INR: INR: 2.7 (ref 2.0–3.0)

## 2019-08-14 NOTE — Patient Instructions (Signed)
-   continue warfarin dosage of 1.5 tablets every day EXCEPT 1 tablet on Mondays & Fridays. - recheck in 6 weeks.

## 2019-08-17 ENCOUNTER — Ambulatory Visit (INDEPENDENT_AMBULATORY_CARE_PROVIDER_SITE_OTHER): Payer: Medicare Other

## 2019-08-17 DIAGNOSIS — E538 Deficiency of other specified B group vitamins: Secondary | ICD-10-CM | POA: Diagnosis not present

## 2019-08-17 MED ORDER — CYANOCOBALAMIN 1000 MCG/ML IJ SOLN
1000.0000 ug | Freq: Once | INTRAMUSCULAR | Status: AC
Start: 2019-08-17 — End: 2019-08-17
  Administered 2019-08-17: 1000 ug via INTRAMUSCULAR

## 2019-08-17 NOTE — Progress Notes (Signed)
Pt received monthly B12 1000 mcg/ml 67ml in right deltoid. Tolerated well. He will call the office to schedule next injection.

## 2019-08-31 DIAGNOSIS — H348392 Tributary (branch) retinal vein occlusion, unspecified eye, stable: Secondary | ICD-10-CM | POA: Diagnosis not present

## 2019-09-20 ENCOUNTER — Ambulatory Visit (INDEPENDENT_AMBULATORY_CARE_PROVIDER_SITE_OTHER): Payer: Medicare Other | Admitting: *Deleted

## 2019-09-20 DIAGNOSIS — E538 Deficiency of other specified B group vitamins: Secondary | ICD-10-CM

## 2019-09-20 MED ORDER — CYANOCOBALAMIN 1000 MCG/ML IJ SOLN
1000.0000 ug | Freq: Once | INTRAMUSCULAR | Status: AC
  Administered 2019-09-20: 1000 ug via INTRAMUSCULAR

## 2019-09-20 NOTE — Progress Notes (Signed)
Per orders of Dr. Copland, injection of B12 given by Keyleigh Manninen M. Patient tolerated injection well. 

## 2019-09-21 ENCOUNTER — Other Ambulatory Visit: Payer: Self-pay | Admitting: Family Medicine

## 2019-09-21 ENCOUNTER — Other Ambulatory Visit: Payer: Self-pay | Admitting: Internal Medicine

## 2019-09-21 NOTE — Telephone Encounter (Signed)
This is a [place] pt 

## 2019-09-21 NOTE — Telephone Encounter (Signed)
Please Review for refill, Thanks !

## 2019-09-25 ENCOUNTER — Other Ambulatory Visit: Payer: Self-pay

## 2019-09-25 ENCOUNTER — Ambulatory Visit (INDEPENDENT_AMBULATORY_CARE_PROVIDER_SITE_OTHER): Payer: Medicare Other

## 2019-09-25 DIAGNOSIS — I480 Paroxysmal atrial fibrillation: Secondary | ICD-10-CM

## 2019-09-25 DIAGNOSIS — Z5181 Encounter for therapeutic drug level monitoring: Secondary | ICD-10-CM | POA: Diagnosis not present

## 2019-09-25 LAB — POCT INR: INR: 3.1 — AB (ref 2.0–3.0)

## 2019-09-25 NOTE — Patient Instructions (Signed)
-   take 1/2 tablet tonight,  - continue warfarin dosage of 1.5 tablets every day EXCEPT 1 tablet on Mondays & Fridays. - recheck in 6 weeks.

## 2019-10-12 DIAGNOSIS — R42 Dizziness and giddiness: Secondary | ICD-10-CM | POA: Diagnosis not present

## 2019-10-12 DIAGNOSIS — H903 Sensorineural hearing loss, bilateral: Secondary | ICD-10-CM | POA: Diagnosis not present

## 2019-10-12 DIAGNOSIS — H6123 Impacted cerumen, bilateral: Secondary | ICD-10-CM | POA: Diagnosis not present

## 2019-10-24 ENCOUNTER — Ambulatory Visit (INDEPENDENT_AMBULATORY_CARE_PROVIDER_SITE_OTHER): Payer: Medicare Other | Admitting: *Deleted

## 2019-10-24 DIAGNOSIS — Z950 Presence of cardiac pacemaker: Secondary | ICD-10-CM

## 2019-10-25 LAB — CUP PACEART REMOTE DEVICE CHECK
Battery Remaining Longevity: 81 mo
Battery Voltage: 3.01 V
Brady Statistic AP VP Percent: 7.51 %
Brady Statistic AP VS Percent: 86.3 %
Brady Statistic AS VP Percent: 3.21 %
Brady Statistic AS VS Percent: 2.97 %
Brady Statistic RA Percent Paced: 92.85 %
Brady Statistic RV Percent Paced: 11.04 %
Date Time Interrogation Session: 20210727094644
Implantable Lead Implant Date: 20070525
Implantable Lead Implant Date: 20070525
Implantable Lead Location: 753859
Implantable Lead Location: 753860
Implantable Lead Model: 5076
Implantable Lead Model: 5076
Implantable Pulse Generator Implant Date: 20171127
Lead Channel Impedance Value: 418 Ohm
Lead Channel Impedance Value: 418 Ohm
Lead Channel Impedance Value: 418 Ohm
Lead Channel Impedance Value: 475 Ohm
Lead Channel Pacing Threshold Amplitude: 0.625 V
Lead Channel Pacing Threshold Amplitude: 0.75 V
Lead Channel Pacing Threshold Pulse Width: 0.4 ms
Lead Channel Pacing Threshold Pulse Width: 0.4 ms
Lead Channel Sensing Intrinsic Amplitude: 1.125 mV
Lead Channel Sensing Intrinsic Amplitude: 1.125 mV
Lead Channel Sensing Intrinsic Amplitude: 2 mV
Lead Channel Sensing Intrinsic Amplitude: 2 mV
Lead Channel Setting Pacing Amplitude: 2 V
Lead Channel Setting Pacing Amplitude: 2.5 V
Lead Channel Setting Pacing Pulse Width: 0.4 ms
Lead Channel Setting Sensing Sensitivity: 0.6 mV

## 2019-10-27 NOTE — Progress Notes (Signed)
Remote pacemaker transmission.   

## 2019-11-01 ENCOUNTER — Ambulatory Visit (INDEPENDENT_AMBULATORY_CARE_PROVIDER_SITE_OTHER): Payer: Medicare Other

## 2019-11-01 ENCOUNTER — Other Ambulatory Visit: Payer: Self-pay

## 2019-11-01 DIAGNOSIS — E538 Deficiency of other specified B group vitamins: Secondary | ICD-10-CM | POA: Diagnosis not present

## 2019-11-01 MED ORDER — CYANOCOBALAMIN 1000 MCG/ML IJ SOLN
1000.0000 ug | Freq: Once | INTRAMUSCULAR | Status: AC
  Administered 2019-11-01: 1000 ug via INTRAMUSCULAR

## 2019-11-01 NOTE — Progress Notes (Signed)
Per orders of Dr Lorelei Pont, pt received monthly B12 injection. Given in right deltoid. Pt tolerated well.

## 2019-11-06 ENCOUNTER — Ambulatory Visit (INDEPENDENT_AMBULATORY_CARE_PROVIDER_SITE_OTHER): Payer: Medicare Other

## 2019-11-06 ENCOUNTER — Other Ambulatory Visit: Payer: Self-pay

## 2019-11-06 DIAGNOSIS — I480 Paroxysmal atrial fibrillation: Secondary | ICD-10-CM | POA: Diagnosis not present

## 2019-11-06 DIAGNOSIS — Z5181 Encounter for therapeutic drug level monitoring: Secondary | ICD-10-CM | POA: Diagnosis not present

## 2019-11-06 LAB — POCT INR: INR: 4 — AB (ref 2.0–3.0)

## 2019-11-06 NOTE — Patient Instructions (Signed)
-   have a serving of greens today - skip warfarin tonight, then - START NEW DOSAGE of 1.5 tablets every day EXCEPT 1 tablet on MONDAYS, Baker - recheck in 5 weeks.

## 2019-12-07 ENCOUNTER — Ambulatory Visit (INDEPENDENT_AMBULATORY_CARE_PROVIDER_SITE_OTHER): Payer: Medicare Other | Admitting: *Deleted

## 2019-12-07 ENCOUNTER — Other Ambulatory Visit: Payer: Self-pay

## 2019-12-07 DIAGNOSIS — E538 Deficiency of other specified B group vitamins: Secondary | ICD-10-CM

## 2019-12-07 DIAGNOSIS — Z23 Encounter for immunization: Secondary | ICD-10-CM

## 2019-12-07 MED ORDER — CYANOCOBALAMIN 1000 MCG/ML IJ SOLN
1000.0000 ug | Freq: Once | INTRAMUSCULAR | Status: AC
Start: 2019-12-07 — End: 2019-12-07
  Administered 2019-12-07: 1000 ug via INTRAMUSCULAR

## 2019-12-07 NOTE — Progress Notes (Signed)
Per orders of Dr. Lorelei Pont, injection of B12 and a flu shot was given by Tammi Sou. Patient tolerated injection well.

## 2019-12-08 ENCOUNTER — Other Ambulatory Visit: Payer: Self-pay | Admitting: Internal Medicine

## 2019-12-08 NOTE — Telephone Encounter (Signed)
This is a [place] pt 

## 2019-12-11 ENCOUNTER — Ambulatory Visit (INDEPENDENT_AMBULATORY_CARE_PROVIDER_SITE_OTHER): Payer: Medicare Other

## 2019-12-11 ENCOUNTER — Other Ambulatory Visit: Payer: Self-pay

## 2019-12-11 DIAGNOSIS — I480 Paroxysmal atrial fibrillation: Secondary | ICD-10-CM

## 2019-12-11 DIAGNOSIS — Z5181 Encounter for therapeutic drug level monitoring: Secondary | ICD-10-CM

## 2019-12-11 LAB — POCT INR: INR: 2.4 (ref 2.0–3.0)

## 2019-12-11 NOTE — Patient Instructions (Signed)
-   continue warfarin dosage of 1.5 tablets every day EXCEPT 1 tablet on MONDAYS, WEDNESDAYS & FRIDAYS - recheck in 6 weeks.

## 2019-12-19 ENCOUNTER — Encounter: Payer: Self-pay | Admitting: Internal Medicine

## 2019-12-19 ENCOUNTER — Ambulatory Visit: Payer: Medicare Other | Admitting: Internal Medicine

## 2019-12-19 ENCOUNTER — Other Ambulatory Visit: Payer: Self-pay

## 2019-12-19 VITALS — BP 110/70 | HR 63 | Ht 68.0 in | Wt 125.2 lb

## 2019-12-19 DIAGNOSIS — Z79899 Other long term (current) drug therapy: Secondary | ICD-10-CM | POA: Diagnosis not present

## 2019-12-19 DIAGNOSIS — I480 Paroxysmal atrial fibrillation: Secondary | ICD-10-CM

## 2019-12-19 DIAGNOSIS — I495 Sick sinus syndrome: Secondary | ICD-10-CM

## 2019-12-19 DIAGNOSIS — Z950 Presence of cardiac pacemaker: Secondary | ICD-10-CM | POA: Diagnosis not present

## 2019-12-19 NOTE — Progress Notes (Signed)
Electrophysiology Office Note    Date:  12/19/2019   ID:  [name] [name], DOB [birth year], MRN [id number]  PCP:  Owens Loffler, MD  Cardiologist:  Primary Electrophysiologist:  Virl Axe, MD    Chief Complaint  Patient presents with  . office visit    6 month F/U; Meds verbally reviewed with patient.     History of Present Illness: [name] [name] is a 86 y.o. male is seen today in followup for  pacemaker implanted for tachybradycardia syndrome in  the context of paroxysmal atrial fibrillation with prior failed ablation. He takes amiodarone. He has had mostly sinus ( 97%) from interrogation 7/17  He underwent device generator replacement 11/17   The patient denies chest pain, shortness of breath, nocturnal dyspnea, orthopnea or peripheral edema.  There have been no palpitations, lightheadedness or syncope.    Walking daily     Date Cr K TSH LFTs Hgb PFTs  7/17    0.71      8/18     12.3   1/19    0.3 15 10.5    8/19 1.93 3.8 0.21  10.4   1/21 2.0 4.7 2.01 19 10.4                Past Medical History:  Diagnosis Date  . AICD (automatic cardioverter/defibrillator) present    PACEMAKER  . Anemia   . Cancer ([place])   . Cardiac pacemaker in situ 07/2005   symptomatic bradycardia  . CKD (chronic kidney disease) stage 3, GFR 30-59 ml/min 04/13/2017  . Diverticulosis of colon (without mention of hemorrhage)   . Dysrhythmia   . 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 GERD (gastroesophageal reflux disease)   . GI hemorrhage   . Hyperlipidemia   . Hypertension   . Lower extremity edema   . Neck arthritis, Severe, multi-level 06/09/2013  . Paroxysmal atrial fibrillation ([name])    s/p failed ablation  . Peptic ulcer, unspecified site, unspecified as acute or chronic, without mention of hemorrhage, perforation, or obstruction 1975  . Personal history of malignant neoplasm of prostate 12/2000   5 weeks  radiation, radiation seeds  . Radiation proctitis   . Sinoatrial node dysfunction ([name])   . Unspecified glaucoma(365.9)   . Unspecified hypothyroidism   . Vitamin B12 deficiency    Past Surgical History:  Procedure Laterality Date  . CARDIAC CATHETERIZATION    . CARDIAC ELECTROPHYSIOLOGY [place] AND ABLATION  2001, 2003   failure  . CATARACT EXTRACTION    . CYSTOGRAM N/A 09/21/2018   Procedure: CYSTOGRAM;  Surgeon: Abbie Sons, MD;  Location: [facility] ORS;  Service: Urology;  Laterality: N/A;  . CYSTOSCOPY WITH URETHRAL DILATATION N/A 01/29/2017   Procedure: CYSTOSCOPY WITH URETHRAL DILATATION;  Surgeon: Abbie Sons, MD;  Location: [facility] ORS;  Service: Urology;  Laterality: N/A;  . CYSTOSCOPY WITH URETHRAL DILATATION N/A 09/21/2018   Procedure: CYSTOSCOPY WITH URETHRAL DILATATION;  Surgeon: Abbie Sons, MD;  Location: [facility] ORS;  Service: Urology;  Laterality: N/A;  . EP IMPLANTABLE DEVICE N/A 02/24/2016   Procedure: PPM Generator Changeout;  Surgeon: Deboraha Sprang, MD;  Location: [place] CV LAB;  Service: Cardiovascular;  Laterality: N/A;  . gastric ulcer surgery    . HERNIA REPAIR  1994, 2001, 2003   s/p drainage and complications, 11/2424, 10/3417 mesh removal  . HIP ARTHROPLASTY Right 02/16/2019   Procedure: RIGHT ANTERIOR  HIP HEMIARTHROPLASTY,CEMENTED;  Surgeon: Hessie Knows, MD;  Location: [facility] ORS;  Service: Orthopedics;  Laterality: Right;  . INSERT / REPLACE / REMOVE PACEMAKER  07/2005   symptomatic bradycardia  . kidney stones    . NOSE SURGERY     cancer removed   . PROSTATE SURGERY    . SKIN CANCER DESTRUCTION    . TOOTH EXTRACTION       Current Outpatient Medications  Medication Sig Dispense Refill  . acetaminophen (TYLENOL) 500 MG tablet Take 500 mg by mouth every 6 (six) hours as needed for moderate pain or headache.    . alendronate (FOSAMAX) 70 MG tablet TAKE 1 TABLET BY MOUTH EVERY 7 DAYS WITH A FULL GLASS OF WATER ON AN EMPTY STOMACH 12 tablet 3  .  amiodarone (PACERONE) 200 MG tablet TAKE 1/2 TABLET(100 MG) BY MOUTH DAILY 15 tablet 0  . Cyanocobalamin (VITAMIN B-12 IJ) Inject 1,000 mcg as directed every 30 (thirty) days.     Marland Kitchen docusate sodium (COLACE) 50 MG capsule Take 50 mg by mouth 2 (two) times daily as needed for mild constipation.    . dorzolamide-timolol (COSOPT) 22.3-6.8 MG/ML ophthalmic solution Place 1 drop into both eyes 2 (two) times daily.      . ferrous sulfate 325 (65 FE) MG EC tablet Take 325 mg by mouth daily with breakfast.      . furosemide (LASIX) 20 MG tablet Take 2 tablets (40 mg) by mouth once every other day as directed 90 tablet 1  . levothyroxine (SYNTHROID) 100 MCG tablet TAKE 1 TABLET BY MOUTH DAILY BEFORE BREAKFAST 90 tablet 3  . loperamide (IMODIUM) 2 MG capsule Take 2 mg by mouth as needed for diarrhea or loose stools.    . Multiple Vitamin (MULTIVITAMIN) tablet Take 1 tablet by mouth daily.      . pantoprazole (PROTONIX) 40 MG tablet TAKE 1 TABLET(40 MG) BY MOUTH DAILY 90 tablet 3  . polyethylene glycol (MIRALAX / GLYCOLAX) packet Take 17 g by mouth daily as needed for moderate constipation.     . pravastatin (PRAVACHOL) 40 MG tablet TAKE 1/2 TABLET(20 MG) BY MOUTH AT BEDTIME 45 tablet 3  . simethicone (MYLICON) 242 MG chewable tablet Chew 125 mg by mouth every 6 (six) hours as needed for flatulence.    . tamsulosin (FLOMAX) 0.4 MG CAPS capsule TAKE 1 CAPSULE BY MOUTH DAILY 90 capsule 3  . warfarin (COUMADIN) 1 MG tablet TAKE 1 AND 1/2 TABLETS BY MOUTH DAILY AS DIRECTED 45 tablet 2  . Wheat Dextrin (BENEFIBER DRINK MIX PO) Take 1 scoop by mouth daily.      No current facility-administered medications for this visit.    Allergies:   Penicillins   Social History:  The patient  reports that he has never smoked. He has never used smokeless tobacco. He reports that he does not drink alcohol and does not use drugs.   Family History:  The patient's    family history includes Alcohol abuse in an other family  member; Bone cancer in his father; Breast cancer in his mother; Coronary artery disease in an other family member; Dementia in an other family member; Diabetes in an other family member.    ROS:  Please see the history of present illness and past medical histor .   All other systems are reviewed and negative.    PHYSICAL EXAM: VS:  BP 110/70 (BP Location: Left Arm, Patient Position: Sitting, Cuff Size: Normal)   Pulse 63   Ht  5\' 8"  (1.727 m)   Wt 125 lb 4 oz (56.8 kg)   SpO2 98%   BMI 19.04 kg/m  , BMI Body mass index is 19.04 kg/m. Well developed and well nourished in no acute distress HENT normal Neck supple with JVP-flat Clear Device pocket well healed; without hematoma or erythema.  There is no tethering  Regular rate and rhythm, no murmur Abd-soft with active BS No Clubbing cyanosis * edema Skin-warm and dry A & Oriented  Grossly normal sensory and motor function  ECG A pacing 63 33/10/41     ASSESSMENT AND PLAN:  Atrial fibrillation - paroxysmal  Sinus node dysfunction   Pacemaker  Medtronic   The patient's device was interrogated.  The information was reviewed. No changes were made in the programming.      Hypothyroidism-treated  High Risk Medication Surveillance Amiodarone therapy  anemia.  Renal dysfunction gd  4  (GFR 18)   Infrequent and asymptomatic atrial fib On warfarin w/o bleeding-- will check cbc Suspect anemia may be related to renal disease; iron studies 1/21 not c/w FeDef  Euvolemic continue current meds   Check renal function  Check thryoid status -- amio assoc hypothyroidism        Signed, Virl Axe, MD  12/19/2019 11:22 AM     [facility] [address] [place] [address] [phone] (office) [phone] (fax)

## 2019-12-19 NOTE — Patient Instructions (Signed)
Medication Instructions:  - Your physician recommends that you continue on your current medications as directed. Please refer to the Current Medication list given to you today.  *If you need a refill on your cardiac medications before your next appointment, please call your pharmacy*   Lab Work: - Your physician recommends that you have lab work today: CMET/ CBC/ TSH  If you have labs (blood work) drawn today and your tests are completely normal, you will receive your results only by: Marland Kitchen MyChart Message (if you have MyChart) OR . A paper copy in the mail If you have any lab test that is abnormal or we need to change your treatment, we will call you to review the results.   Testing/Procedures: - none ordered   Follow-Up: At [facility], you and your health needs are our priority.  As part of our continuing mission to provide you with exceptional heart care, we have created designated Provider Care Teams.  These Care Teams include your primary Cardiologist (physician) and Advanced Practice Providers (APPs -  Physician Assistants and Nurse Practitioners) who all work together to provide you with the care you need, when you need it.  We recommend signing up for the patient portal called "MyChart".  Sign up information is provided on this After Visit Summary.  MyChart is used to connect with patients for Virtual Visits (Telemedicine).  Patients are able to view lab/test results, encounter notes, upcoming appointments, etc.  Non-urgent messages can be sent to your provider as well.   To learn more about what you can do with MyChart, go to [email or web address].    Your next appointment:   6 month(s)  The format for your next appointment:   In Person  Provider:   Virl Axe, MD   Other Instructions n/a

## 2019-12-20 ENCOUNTER — Other Ambulatory Visit: Payer: Self-pay | Admitting: Internal Medicine

## 2019-12-20 LAB — CBC WITH DIFFERENTIAL/PLATELET
Basophils Absolute: 0 10*3/uL (ref 0.0–0.2)
Basos: 1 %
EOS (ABSOLUTE): 0.2 10*3/uL (ref 0.0–0.4)
Eos: 4 %
Hematocrit: 37 % — ABNORMAL LOW (ref 37.5–51.0)
Hemoglobin: 11.9 g/dL — ABNORMAL LOW (ref 13.0–17.7)
Immature Grans (Abs): 0 10*3/uL (ref 0.0–0.1)
Immature Granulocytes: 0 %
Lymphocytes Absolute: 1.5 10*3/uL (ref 0.7–3.1)
Lymphs: 27 %
MCH: 31.7 pg (ref 26.6–33.0)
MCHC: 32.2 g/dL (ref 31.5–35.7)
MCV: 99 fL — ABNORMAL HIGH (ref 79–97)
Monocytes Absolute: 0.4 10*3/uL (ref 0.1–0.9)
Monocytes: 8 %
Neutrophils Absolute: 3.2 10*3/uL (ref 1.4–7.0)
Neutrophils: 60 %
Platelets: 174 10*3/uL (ref 150–450)
RBC: 3.75 x10E6/uL — ABNORMAL LOW (ref 4.14–5.80)
RDW: 12.5 % (ref 11.6–15.4)
WBC: 5.4 10*3/uL (ref 3.4–10.8)

## 2019-12-20 LAB — COMPREHENSIVE METABOLIC PANEL
ALT: 17 IU/L (ref 0–44)
AST: 21 IU/L (ref 0–40)
Albumin/Globulin Ratio: 1.3 (ref 1.2–2.2)
Albumin: 4 g/dL (ref 3.5–4.6)
Alkaline Phosphatase: 89 IU/L (ref 44–121)
BUN/Creatinine Ratio: 24 (ref 10–24)
BUN: 54 mg/dL — ABNORMAL HIGH (ref 10–36)
Bilirubin Total: 0.7 mg/dL (ref 0.0–1.2)
CO2: 21 mmol/L (ref 20–29)
Calcium: 9.6 mg/dL (ref 8.6–10.2)
Chloride: 107 mmol/L — ABNORMAL HIGH (ref 96–106)
Creatinine, Ser: 2.24 mg/dL — ABNORMAL HIGH (ref 0.76–1.27)
GFR calc Af Amer: 28 mL/min/{1.73_m2} — ABNORMAL LOW (ref >59)
GFR calc non Af Amer: 24 mL/min/{1.73_m2} — ABNORMAL LOW (ref >59)
Globulin, Total: 3 g/dL (ref 1.5–4.5)
Glucose: 105 mg/dL — ABNORMAL HIGH (ref 65–99)
Potassium: 4.4 mmol/L (ref 3.5–5.2)
Sodium: 140 mmol/L (ref 134–144)
Total Protein: 7 g/dL (ref 6.0–8.5)

## 2019-12-20 LAB — TSH: TSH: 0.941 u[IU]/mL (ref 0.450–4.500)

## 2019-12-21 ENCOUNTER — Telehealth: Payer: Self-pay | Admitting: Internal Medicine

## 2019-12-21 NOTE — Telephone Encounter (Signed)
Patient is returning your call.  

## 2019-12-21 NOTE — Telephone Encounter (Signed)
The patient has been notified of the result and verbalized understanding. All questions (if any) were answered. Alvis Lemmings, RN 12/21/2019 11:18 AM   The patient is also aware I will forward a copy of his results to Dr. Lorelei Pont to review and ask that he/ someone from his office reach out to him if he feels a sooner follow up than March 2022 would be warranted.  The patient voiced understanding

## 2019-12-21 NOTE — Telephone Encounter (Signed)
[name] Sprang, MD  12/20/2019 4:39 PM EDT     Please Inform Patient  Labs are normal x 1) improved hemoglobin 2) worsening renal function-- he should meet with his PCP to discuss what to do medicine wise, diet wise to avoid harm as muchas possible   Thanks

## 2019-12-21 NOTE — Telephone Encounter (Signed)
Attempted to call the patient. No answer- I left a message to please call back.  

## 2019-12-22 LAB — CUP PACEART INCLINIC DEVICE CHECK
Battery Remaining Longevity: 79 mo
Battery Voltage: 3.01 V
Brady Statistic AP VP Percent: 5.85 %
Brady Statistic AP VS Percent: 88.82 %
Brady Statistic AS VP Percent: 2.58 %
Brady Statistic AS VS Percent: 2.74 %
Brady Statistic RA Percent Paced: 93.84 %
Brady Statistic RV Percent Paced: 8.74 %
Date Time Interrogation Session: 20210921105200
Implantable Lead Implant Date: 20070525
Implantable Lead Implant Date: 20070525
Implantable Lead Location: 753859
Implantable Lead Location: 753860
Implantable Lead Model: 5076
Implantable Lead Model: 5076
Implantable Pulse Generator Implant Date: 20171127
Lead Channel Impedance Value: 342 Ohm
Lead Channel Impedance Value: 361 Ohm
Lead Channel Impedance Value: 380 Ohm
Lead Channel Impedance Value: 399 Ohm
Lead Channel Pacing Threshold Amplitude: 0.75 V
Lead Channel Pacing Threshold Amplitude: 1 V
Lead Channel Pacing Threshold Pulse Width: 0.4 ms
Lead Channel Pacing Threshold Pulse Width: 0.4 ms
Lead Channel Sensing Intrinsic Amplitude: 2 mV
Lead Channel Sensing Intrinsic Amplitude: 3.6 mV
Lead Channel Setting Pacing Amplitude: 2 V
Lead Channel Setting Pacing Amplitude: 2.5 V
Lead Channel Setting Pacing Pulse Width: 0.4 ms
Lead Channel Setting Sensing Sensitivity: 0.6 mV

## 2019-12-25 ENCOUNTER — Ambulatory Visit (INDEPENDENT_AMBULATORY_CARE_PROVIDER_SITE_OTHER): Payer: Medicare Other | Admitting: Family Medicine

## 2019-12-25 ENCOUNTER — Encounter: Payer: Self-pay | Admitting: Family Medicine

## 2019-12-25 ENCOUNTER — Other Ambulatory Visit: Payer: Self-pay

## 2019-12-25 VITALS — BP 120/78 | HR 87 | Temp 97.6°F | Ht 68.0 in | Wt 124.0 lb

## 2019-12-25 DIAGNOSIS — N184 Chronic kidney disease, stage 4 (severe): Secondary | ICD-10-CM | POA: Diagnosis not present

## 2019-12-25 MED ORDER — FUROSEMIDE 20 MG PO TABS
ORAL_TABLET | ORAL | 1 refills | Status: DC
Start: 2019-12-25 — End: 2020-01-02

## 2019-12-25 NOTE — Progress Notes (Signed)
Ladell Lea T. Armel Rabbani, MD, [place]  Primary Care and [place] at [facility] [place], [address]  Phone: [phone]  FAX: [phone]  MAXIMINO COZZOLINO - 86 y.o. male  MRN [id number]  Date of Birth: 12/30/1922  Date: 12/25/2019  PCP: Owens Loffler, MD  Referral: Owens Loffler, MD  Chief Complaint  Patient presents with  . Follow-up    Labs    This visit occurred during the SARS-CoV-2 public health emergency.  Safety protocols were in place, including screening questions prior to the visit, additional usage of staff PPE, and extensive cleaning of exam room while observing appropriate contact time as indicated for disinfecting solutions.   Subjective:   [name] [name] is a 86 y.o. very pleasant male patient with Body mass index is 18.85 kg/m. who presents with the following:  F/u worsening CKD:  BMP Latest Ref Rng & Units 12/19/2019 04/12/2019 02/19/2019  Glucose 65 - 99 mg/dL 105(H) 107(H) 104(H)  BUN 10 - 36 mg/dL 54(H) 71(H) 50(H)  Creatinine 0.76 - 1.27 mg/dL 2.24(H) 2.00(H) 2.19(H)  BUN/Creat Ratio 10 - 24 24 - -  Sodium 134 - 144 mmol/L 140 139 137  Potassium 3.5 - 5.2 mmol/L 4.4 4.7 4.7  Chloride 96 - 106 mmol/L 107(H) 107 109  CO2 20 - 29 mmol/L 21 27 21(L)  Calcium 8.6 - 10.2 mg/dL 9.6 8.9 8.6(L)    Cardiology sent me a note regarding his renal function.  This is not new.  I am concerned that he is being over diuresed.  BUN is 54 and earlier in the year was 71.  Review of Systems is noted in the HPI, as appropriate  Objective:   BP 120/78   Pulse 87   Temp 97.6 F (36.4 C) (Temporal)   Ht 5\' 8"  (1.727 m)   Wt 124 lb (56.2 kg)   SpO2 95%   BMI 18.85 kg/m   GEN: No acute distress; alert,appropriate. PULM: Breathing comfortably in no respiratory distress PSYCH: Normally interactive.   Laboratory and Imaging Data:  Assessment and Plan:     ICD-10-CM   1. Chronic kidney  disease, stage 4 (severe) ([name])  D82.6 Basic metabolic panel    Basic metabolic panel   Concern for overdiuresis.  Discontinue use of Lasix for 2 days.  Adjust dosing to 1 tablet every other day, 20 mg every other day.  Obtain a BMP today.  I am going to have him increase his fluid intake daily and recheck a BMP in 1 week's time.  Ultimately, if his BMP remains high with an elevated creatinine, may have the patient discontinue his furosemide and go to as needed only.  Meds ordered this encounter  Medications  . furosemide (LASIX) 20 MG tablet    Sig: Take 1 tablets (20 mg) by mouth once every other day as directed    Dispense:  90 tablet    Refill:  1   Medications Discontinued During This Encounter  Medication Reason  . furosemide (LASIX) 20 MG tablet    Orders Placed This Encounter  Procedures  . Basic metabolic panel  . Basic metabolic panel    Follow-up: No follow-ups on file.  Signed,  [name]. Ariyel Jeangilles, MD   Outpatient Encounter Medications as of 12/25/2019  Medication Sig  . acetaminophen (TYLENOL) 500 MG tablet Take 500 mg by mouth every 6 (six) hours as needed for moderate pain or headache.  . alendronate (FOSAMAX) 70  MG tablet TAKE 1 TABLET BY MOUTH EVERY 7 DAYS WITH A FULL GLASS OF WATER ON AN EMPTY STOMACH  . amiodarone (PACERONE) 200 MG tablet TAKE 1/2 TABLET(100 MG) BY MOUTH DAILY  . Cyanocobalamin (VITAMIN B-12 IJ) Inject 1,000 mcg as directed every 30 (thirty) days.   Marland Kitchen docusate sodium (COLACE) 50 MG capsule Take 50 mg by mouth 2 (two) times daily as needed for mild constipation.  . dorzolamide-timolol (COSOPT) 22.3-6.8 MG/ML ophthalmic solution Place 1 drop into both eyes 2 (two) times daily.    . ferrous sulfate 325 (65 FE) MG EC tablet Take 325 mg by mouth daily with breakfast.    . furosemide (LASIX) 20 MG tablet Take 1 tablets (20 mg) by mouth once every other day as directed  . levothyroxine (SYNTHROID) 100 MCG tablet TAKE 1 TABLET BY MOUTH DAILY BEFORE  BREAKFAST  . loperamide (IMODIUM) 2 MG capsule Take 2 mg by mouth as needed for diarrhea or loose stools.  . Multiple Vitamin (MULTIVITAMIN) tablet Take 1 tablet by mouth daily.    . pantoprazole (PROTONIX) 40 MG tablet TAKE 1 TABLET(40 MG) BY MOUTH DAILY  . polyethylene glycol (MIRALAX / GLYCOLAX) packet Take 17 g by mouth daily as needed for moderate constipation.   . pravastatin (PRAVACHOL) 40 MG tablet TAKE 1/2 TABLET(20 MG) BY MOUTH AT BEDTIME  . simethicone (MYLICON) 893 MG chewable tablet Chew 125 mg by mouth every 6 (six) hours as needed for flatulence.  . tamsulosin (FLOMAX) 0.4 MG CAPS capsule TAKE 1 CAPSULE BY MOUTH DAILY  . warfarin (COUMADIN) 1 MG tablet TAKE 1 AND 1/2 TABLETS BY MOUTH DAILY AS DIRECTED  . Wheat Dextrin (BENEFIBER DRINK MIX PO) Take 1 scoop by mouth daily.   . [DISCONTINUED] furosemide (LASIX) 20 MG tablet Take 2 tablets (40 mg) by mouth once every other day as directed   No facility-administered encounter medications on file as of 12/25/2019.

## 2019-12-26 LAB — BASIC METABOLIC PANEL
BUN: 52 mg/dL — ABNORMAL HIGH (ref 6–23)
CO2: 25 mEq/L (ref 19–32)
Calcium: 9.1 mg/dL (ref 8.4–10.5)
Chloride: 109 mEq/L (ref 96–112)
Creatinine, Ser: 2.18 mg/dL — ABNORMAL HIGH (ref 0.40–1.50)
GFR: 28.26 mL/min — ABNORMAL LOW (ref >60.00)
Glucose, Bld: 99 mg/dL (ref 70–99)
Potassium: 4.5 mEq/L (ref 3.5–5.1)
Sodium: 138 mEq/L (ref 135–145)

## 2020-01-01 ENCOUNTER — Other Ambulatory Visit (INDEPENDENT_AMBULATORY_CARE_PROVIDER_SITE_OTHER): Payer: Medicare Other

## 2020-01-01 ENCOUNTER — Other Ambulatory Visit: Payer: Self-pay

## 2020-01-01 DIAGNOSIS — N184 Chronic kidney disease, stage 4 (severe): Secondary | ICD-10-CM

## 2020-01-01 LAB — BASIC METABOLIC PANEL
BUN: 60 mg/dL — ABNORMAL HIGH (ref 6–23)
CO2: 24 mEq/L (ref 19–32)
Calcium: 8.6 mg/dL (ref 8.4–10.5)
Chloride: 109 mEq/L (ref 96–112)
Creatinine, Ser: 2.32 mg/dL — ABNORMAL HIGH (ref 0.40–1.50)
GFR: 26.3 mL/min — ABNORMAL LOW (ref >60.00)
Glucose, Bld: 92 mg/dL (ref 70–99)
Potassium: 4.3 mEq/L (ref 3.5–5.1)
Sodium: 138 mEq/L (ref 135–145)

## 2020-01-02 ENCOUNTER — Telehealth: Payer: Self-pay | Admitting: *Deleted

## 2020-01-02 DIAGNOSIS — N184 Chronic kidney disease, stage 4 (severe): Secondary | ICD-10-CM

## 2020-01-02 DIAGNOSIS — L578 Other skin changes due to chronic exposure to nonionizing radiation: Secondary | ICD-10-CM | POA: Diagnosis not present

## 2020-01-02 DIAGNOSIS — Z872 Personal history of diseases of the skin and subcutaneous tissue: Secondary | ICD-10-CM | POA: Diagnosis not present

## 2020-01-02 DIAGNOSIS — Z859 Personal history of malignant neoplasm, unspecified: Secondary | ICD-10-CM | POA: Diagnosis not present

## 2020-01-02 DIAGNOSIS — Z85828 Personal history of other malignant neoplasm of skin: Secondary | ICD-10-CM | POA: Diagnosis not present

## 2020-01-02 NOTE — Telephone Encounter (Signed)
Laurel left v/m that since stopping furosemide this afternoon noticed pts feet are starting to swell and [name] wants to know if pt should continue to be off the furosemide. Laurel request cb. I spoke with [facility] when pt sits he is not elevating his feet; [name] will have pt to elevate feet when sitting. Pt is not having any redness or pain in feet or legs; the feet are just starting to swell; no CP, SOB or difficulty breathing. Laurel request cb after Dr Lorelei Pont reviews note. [facility] [address]/ [place].

## 2020-01-02 NOTE — Telephone Encounter (Signed)
Left message for [name] [name] to return my call in regards to her Dad's lab results.

## 2020-01-02 NOTE — Telephone Encounter (Signed)
-----   Message from Owens Loffler, MD sent at 01/01/2020  4:48 PM EDT ----- Please call  His kidney function is not much better.  I want him to completely stop his furosemide.   It looks like he is very dehydrated, so let's completely get rid of his diuretics.  After being off of his furosemide, Terri, can you set him up for a repeat BMP in about 10 days.  BMP: re: CKD 4

## 2020-01-02 NOTE — Telephone Encounter (Signed)
Laurel notified as instructed by telephone. Lab appointment scheduled for 01/12/2020 at 2:00 pm for repeat BMP.  Future orders in Epic.  Medication list updated.

## 2020-01-02 NOTE — Telephone Encounter (Signed)
Left message for [name] [name] to return my call in regards to his lab results.

## 2020-01-03 NOTE — Telephone Encounter (Signed)
After clarifying Dr. Lillie Fragmin instruction, he decided to have patient take his furosemide every other day and schedule a face to face office visit in 1 to 2 weeks.   Laurel notified of this via telephone.  She will call back to schedule office visit once she can review her calendar.

## 2020-01-03 NOTE — Telephone Encounter (Signed)
Please call  This is a very challenging question.  There is a balance between his slightly worsening kidney function and how much swelling he is having.  There is not a perfect answer.  I would really like to see if his kidneys improve off of furosemide.  Probably the best thing is for them to come in face to face to talk about all of this - 1 - 2 weeks from now should be ok.

## 2020-01-08 ENCOUNTER — Other Ambulatory Visit: Payer: Self-pay | Admitting: Internal Medicine

## 2020-01-09 ENCOUNTER — Ambulatory Visit: Payer: Medicare Other

## 2020-01-09 NOTE — Progress Notes (Signed)
[name] [name] T. [name], MD, [name]  Primary Care and [name] [name] at [facility] [name] [name], [address]  Phone: [phone]  FAX: [phone]  [name] - 86 y.o. male  MRN [id number]  Date of Birth: 01/12/1923  Date: 01/10/2020  PCP: Owens Loffler, MD  Referral: Owens Loffler, MD  Chief Complaint  Patient presents with  . Follow-up    CKD/Foot Swelling    This visit occurred during the SARS-CoV-2 public health emergency.  Safety protocols were in place, including screening questions prior to the visit, additional usage of staff PPE, and extensive cleaning of exam room while observing appropriate contact time as indicated for disinfecting solutions.   Subjective:   [name] [name] is a 86 y.o. very pleasant male patient with Body mass index is 19.2 kg/m. who presents with the following:  He is a 86 year old gentleman, he has been dwindling some.  He is down to 124 pounds.  His creatinine is approximately 2-2.4 and I attempted to see if we could take him off Lasix and see if his GFR would improve.  Ultimately, he developed some lower leg swelling and had to go back on his Lasix.  I am here today to recheck his BMP and have a further discussion with his family.  Foot swelling and CKD in a setting where I tried to decrease his lasix to hopefully improve his GFR.  Review of Systems is noted in the HPI, as appropriate  Objective:   BP 100/60   Pulse 72   Temp 97.7 F (36.5 C) (Temporal)   Ht 5\' 8"  (1.727 m)   Wt 126 lb 4 oz (57.3 kg)   SpO2 100%   BMI 19.20 kg/m   GEN: No acute distress; alert,appropriate. PULM: Breathing comfortably in no respiratory distress PSYCH: Normally interactive.  CV: RRR, no m/g/r  Lower extremities with trace to 1+ edema  Laboratory and Imaging Data: Results for orders placed or performed in visit on 93/57/01  Basic metabolic panel  Result Value Ref Range    Sodium 138 135 - 145 mEq/L   Potassium 4.5 3.5 - 5.1 mEq/L   Chloride 108 96 - 112 mEq/L   CO2 26 19 - 32 mEq/L   Glucose, Bld 90 70 - 99 mg/dL   BUN 53 (H) 6 - 23 mg/dL   Creatinine, Ser 2.23 (H) 0.40 - 1.50 mg/dL   GFR 24.19 (L) >60.00 mL/min   Calcium 8.8 8.4 - 10.5 mg/dL     Assessment and Plan:     ICD-10-CM   1. Chronic kidney disease, stage 4 (severe) ([name])  X79.3 Basic metabolic panel  2. PACEMAKER, PERMANENT  Z95.0   3. Vitamin B 12 deficiency  E53.8 cyanocobalamin ((VITAMIN B-12)) injection 1,000 mcg   Total encounter time: 20 minutes. This includes total time spent on the day of encounter.  I talked with the patient and his daughter about ongoing management with chronic kidney disease stage IV.  He has been relatively stable for some time.  He has had some worsening in the recent years.  We talked about ongoing management with more routine follow-up, and this is appropriate and 86 year old as well.  We talked about additional options such as nephrology involvement, but for now both the patient and his daughter would prefer for me to follow without involving an additional physician.  Meds ordered this encounter  Medications  . cyanocobalamin ((VITAMIN B-12)) injection 1,000 mcg   There  are no discontinued medications. Orders Placed This Encounter  Procedures  . Basic metabolic panel    Follow-up: Return in about 3 months (around 04/11/2020).  Signed,  [name] [name]. [name] Steier, MD   Outpatient Encounter Medications as of 01/10/2020  Medication Sig  . acetaminophen (TYLENOL) 500 MG tablet Take 500 mg by mouth every 6 (six) hours as needed for moderate pain or headache.  . alendronate (FOSAMAX) 70 MG tablet TAKE 1 TABLET BY MOUTH EVERY 7 DAYS WITH A FULL GLASS OF WATER ON AN EMPTY STOMACH  . amiodarone (PACERONE) 200 MG tablet TAKE 1/2 TABLET(100 MG) BY MOUTH DAILY  . Cyanocobalamin (VITAMIN B-12 IJ) Inject 1,000 mcg as directed every 30 (thirty) days.   Marland Kitchen docusate sodium  (COLACE) 50 MG capsule Take 50 mg by mouth 2 (two) times daily as needed for mild constipation.  . dorzolamide-timolol (COSOPT) 22.3-6.8 MG/ML ophthalmic solution Place 1 drop into both eyes 2 (two) times daily.    . ferrous sulfate 325 (65 FE) MG EC tablet Take 325 mg by mouth daily with breakfast.    . furosemide (LASIX) 20 MG tablet Take 20 mg by mouth every other day.  . levothyroxine (SYNTHROID) 100 MCG tablet TAKE 1 TABLET BY MOUTH DAILY BEFORE BREAKFAST  . loperamide (IMODIUM) 2 MG capsule Take 2 mg by mouth as needed for diarrhea or loose stools.  . Multiple Vitamin (MULTIVITAMIN) tablet Take 1 tablet by mouth daily.    . pantoprazole (PROTONIX) 40 MG tablet TAKE 1 TABLET(40 MG) BY MOUTH DAILY  . polyethylene glycol (MIRALAX / GLYCOLAX) packet Take 17 g by mouth daily as needed for moderate constipation.   . pravastatin (PRAVACHOL) 40 MG tablet TAKE 1/2 TABLET(20 MG) BY MOUTH AT BEDTIME  . simethicone (MYLICON) 269 MG chewable tablet Chew 125 mg by mouth every 6 (six) hours as needed for flatulence.  . tamsulosin (FLOMAX) 0.4 MG CAPS capsule TAKE 1 CAPSULE BY MOUTH DAILY  . warfarin (COUMADIN) 1 MG tablet TAKE 1 AND 1/2 TABLETS BY MOUTH DAILY AS DIRECTED  . Wheat Dextrin (BENEFIBER DRINK MIX PO) Take 1 scoop by mouth daily.   . [EXPIRED] cyanocobalamin ((VITAMIN B-12)) injection 1,000 mcg    No facility-administered encounter medications on file as of 01/10/2020.

## 2020-01-10 ENCOUNTER — Encounter: Payer: Self-pay | Admitting: Family Medicine

## 2020-01-10 ENCOUNTER — Other Ambulatory Visit: Payer: Self-pay

## 2020-01-10 ENCOUNTER — Ambulatory Visit (INDEPENDENT_AMBULATORY_CARE_PROVIDER_SITE_OTHER): Payer: Medicare Other | Admitting: Family Medicine

## 2020-01-10 VITALS — BP 100/60 | HR 72 | Temp 97.7°F | Ht 68.0 in | Wt 126.2 lb

## 2020-01-10 DIAGNOSIS — N184 Chronic kidney disease, stage 4 (severe): Secondary | ICD-10-CM

## 2020-01-10 DIAGNOSIS — E538 Deficiency of other specified B group vitamins: Secondary | ICD-10-CM

## 2020-01-10 DIAGNOSIS — Z950 Presence of cardiac pacemaker: Secondary | ICD-10-CM | POA: Diagnosis not present

## 2020-01-10 LAB — BASIC METABOLIC PANEL
BUN: 53 mg/dL — ABNORMAL HIGH (ref 6–23)
CO2: 26 mEq/L (ref 19–32)
Calcium: 8.8 mg/dL (ref 8.4–10.5)
Chloride: 108 mEq/L (ref 96–112)
Creatinine, Ser: 2.23 mg/dL — ABNORMAL HIGH (ref 0.40–1.50)
GFR: 24.19 mL/min — ABNORMAL LOW (ref >60.00)
Glucose, Bld: 90 mg/dL (ref 70–99)
Potassium: 4.5 mEq/L (ref 3.5–5.1)
Sodium: 138 mEq/L (ref 135–145)

## 2020-01-10 MED ORDER — CYANOCOBALAMIN 1000 MCG/ML IJ SOLN
1000.0000 ug | Freq: Once | INTRAMUSCULAR | Status: AC
  Administered 2020-01-10: 1000 ug via INTRAMUSCULAR

## 2020-01-12 ENCOUNTER — Other Ambulatory Visit: Payer: Medicare Other

## 2020-01-22 ENCOUNTER — Other Ambulatory Visit: Payer: Self-pay

## 2020-01-22 ENCOUNTER — Ambulatory Visit: Payer: Medicare Other

## 2020-01-22 DIAGNOSIS — Z5181 Encounter for therapeutic drug level monitoring: Secondary | ICD-10-CM

## 2020-01-22 DIAGNOSIS — I480 Paroxysmal atrial fibrillation: Secondary | ICD-10-CM | POA: Diagnosis not present

## 2020-01-22 LAB — POCT INR: INR: 3 (ref 2.0–3.0)

## 2020-01-22 NOTE — Progress Notes (Signed)
Pt's girlfriend of 17 yrs passed away last week. She was in the memory care unit @ [facility] in [place]. He was last able to see her 4 wks ago.

## 2020-01-22 NOTE — Patient Instructions (Signed)
-   continue warfarin dosage of 1.5 tablets every day EXCEPT 1 tablet on MONDAYS, [place] - try to have some greens in the next day or so - recheck in 6 weeks.

## 2020-01-23 ENCOUNTER — Ambulatory Visit (INDEPENDENT_AMBULATORY_CARE_PROVIDER_SITE_OTHER): Payer: Medicare Other

## 2020-01-23 DIAGNOSIS — I495 Sick sinus syndrome: Secondary | ICD-10-CM

## 2020-01-23 LAB — CUP PACEART REMOTE DEVICE CHECK
Battery Remaining Longevity: 70 mo
Battery Voltage: 3 V
Brady Statistic AP VP Percent: 8.2 %
Brady Statistic AP VS Percent: 90.78 %
Brady Statistic AS VP Percent: 0.32 %
Brady Statistic AS VS Percent: 0.69 %
Brady Statistic RA Percent Paced: 98.88 %
Brady Statistic RV Percent Paced: 8.97 %
Date Time Interrogation Session: 20211026131743
Implantable Lead Implant Date: 20070525
Implantable Lead Implant Date: 20070525
Implantable Lead Location: 753859
Implantable Lead Location: 753860
Implantable Lead Model: 5076
Implantable Lead Model: 5076
Implantable Pulse Generator Implant Date: 20171127
Lead Channel Impedance Value: 342 Ohm
Lead Channel Impedance Value: 361 Ohm
Lead Channel Impedance Value: 380 Ohm
Lead Channel Impedance Value: 399 Ohm
Lead Channel Pacing Threshold Amplitude: 0.75 V
Lead Channel Pacing Threshold Amplitude: 0.75 V
Lead Channel Pacing Threshold Pulse Width: 0.4 ms
Lead Channel Pacing Threshold Pulse Width: 0.4 ms
Lead Channel Sensing Intrinsic Amplitude: 1.5 mV
Lead Channel Sensing Intrinsic Amplitude: 1.5 mV
Lead Channel Sensing Intrinsic Amplitude: 2.375 mV
Lead Channel Sensing Intrinsic Amplitude: 2.375 mV
Lead Channel Setting Pacing Amplitude: 2 V
Lead Channel Setting Pacing Amplitude: 2.5 V
Lead Channel Setting Pacing Pulse Width: 0.4 ms
Lead Channel Setting Sensing Sensitivity: 0.6 mV

## 2020-01-29 NOTE — Progress Notes (Signed)
Remote pacemaker transmission.   

## 2020-02-13 ENCOUNTER — Ambulatory Visit (INDEPENDENT_AMBULATORY_CARE_PROVIDER_SITE_OTHER): Payer: Medicare Other

## 2020-02-13 ENCOUNTER — Other Ambulatory Visit: Payer: Self-pay

## 2020-02-13 DIAGNOSIS — E538 Deficiency of other specified B group vitamins: Secondary | ICD-10-CM

## 2020-02-13 MED ORDER — CYANOCOBALAMIN 1000 MCG/ML IJ SOLN
1000.0000 ug | Freq: Once | INTRAMUSCULAR | Status: AC
  Administered 2020-02-13: 1000 ug via INTRAMUSCULAR

## 2020-02-13 NOTE — Progress Notes (Signed)
Per orders of Dr. Diona Browner in Dr. Serita Grit absence , injection of B12 given in L Deltoid by Randall An. Patient tolerated injection well.

## 2020-02-29 DIAGNOSIS — H348392 Tributary (branch) retinal vein occlusion, unspecified eye, stable: Secondary | ICD-10-CM | POA: Diagnosis not present

## 2020-03-04 ENCOUNTER — Ambulatory Visit (INDEPENDENT_AMBULATORY_CARE_PROVIDER_SITE_OTHER): Payer: Medicare Other

## 2020-03-04 ENCOUNTER — Other Ambulatory Visit: Payer: Self-pay

## 2020-03-04 DIAGNOSIS — I480 Paroxysmal atrial fibrillation: Secondary | ICD-10-CM

## 2020-03-04 DIAGNOSIS — Z5181 Encounter for therapeutic drug level monitoring: Secondary | ICD-10-CM | POA: Diagnosis not present

## 2020-03-04 LAB — POCT INR: INR: 3.4 — AB (ref 2.0–3.0)

## 2020-03-04 NOTE — Patient Instructions (Signed)
-   skip warfarin tonight, then  - continue warfarin dosage of 1.5 tablets every day EXCEPT 1 tablet on MONDAYS, [place] - try to have some greens in the next day or so - recheck in 6 weeks.

## 2020-03-27 ENCOUNTER — Other Ambulatory Visit: Payer: Self-pay | Admitting: Internal Medicine

## 2020-04-10 ENCOUNTER — Other Ambulatory Visit: Payer: Self-pay

## 2020-04-10 ENCOUNTER — Ambulatory Visit (INDEPENDENT_AMBULATORY_CARE_PROVIDER_SITE_OTHER): Payer: Medicare Other

## 2020-04-10 DIAGNOSIS — E538 Deficiency of other specified B group vitamins: Secondary | ICD-10-CM

## 2020-04-10 MED ORDER — CYANOCOBALAMIN 1000 MCG/ML IJ SOLN
1000.0000 ug | Freq: Once | INTRAMUSCULAR | Status: AC
  Administered 2020-04-10: 1000 ug via INTRAMUSCULAR

## 2020-04-10 NOTE — Progress Notes (Signed)
Per orders of Dr. Copland, injection of vit B12 given by Jaskirat Zertuche. Patient tolerated injection well.  

## 2020-04-17 ENCOUNTER — Other Ambulatory Visit: Payer: Self-pay

## 2020-04-17 ENCOUNTER — Ambulatory Visit (INDEPENDENT_AMBULATORY_CARE_PROVIDER_SITE_OTHER): Payer: Medicare Other | Admitting: Family Medicine

## 2020-04-17 ENCOUNTER — Encounter: Payer: Self-pay | Admitting: Family Medicine

## 2020-04-17 VITALS — BP 90/50 | HR 86 | Temp 97.4°F | Ht 68.0 in | Wt 132.8 lb

## 2020-04-17 DIAGNOSIS — E538 Deficiency of other specified B group vitamins: Secondary | ICD-10-CM

## 2020-04-17 DIAGNOSIS — E43 Unspecified severe protein-calorie malnutrition: Secondary | ICD-10-CM

## 2020-04-17 DIAGNOSIS — Z79899 Other long term (current) drug therapy: Secondary | ICD-10-CM

## 2020-04-17 DIAGNOSIS — E038 Other specified hypothyroidism: Secondary | ICD-10-CM | POA: Diagnosis not present

## 2020-04-17 DIAGNOSIS — E559 Vitamin D deficiency, unspecified: Secondary | ICD-10-CM

## 2020-04-17 DIAGNOSIS — N184 Chronic kidney disease, stage 4 (severe): Secondary | ICD-10-CM | POA: Diagnosis not present

## 2020-04-17 DIAGNOSIS — E782 Mixed hyperlipidemia: Secondary | ICD-10-CM | POA: Diagnosis not present

## 2020-04-17 NOTE — Progress Notes (Signed)
Tylen Leverich T. Anyela Napierkowski, MD, [place] at [facility] [place], [address]  Phone: [phone]  FAX: Waller y.o. male  MRN [id number]  Date of Birth: 04-19-1922  Date: 04/17/2020  PCP: Owens Loffler, MD  Referral: Owens Loffler, MD  Chief Complaint  Patient presents with  . Follow-up    3 month    This visit occurred during the SARS-CoV-2 public health emergency.  Safety protocols were in place, including screening questions prior to the visit, additional usage of staff PPE, and extensive cleaning of exam room while observing appropriate contact time as indicated for disinfecting solutions.   Subjective:   [name] [name] is a 87 y.o. very pleasant male patient with Body mass index is 20.18 kg/m. who presents with the following:  He had been losing quite a lot of weight, and I last saw him on January 10, 2020.  He had gotten down to 124 pounds in December 25, 2019.  Thankfully today he is up to 133 pounds.  He has done better eating, and he is really not limiting himself at all.  He is otherwise compliant, he takes all of his medication, he routinely gets his B12 shots.  He is doing a better job drinking and eating regularly.  BMP CBC HFP Thyroid Vit D B12 LDL  At least 2 BM a day. Some loose and some   Wt Readings from Last 3 Encounters:  04/17/20 132 lb 12 oz (60.2 kg)  01/10/20 126 lb 4 oz (57.3 kg)  12/25/19 124 lb (56.2 kg)    Walker when going out of   3 steps in and out with a railing  Immunization History  Administered Date(s) Administered  . Fluad Quad(high Dose 65+) 01/04/2019, 12/07/2019  . H1N1 05/09/2008  . Influenza Split 02/10/2011  . Influenza Whole 01/15/2009, 01/13/2010  . Influenza, High Dose Seasonal PF 12/21/2012, 02/23/2017  . Influenza,inj,Quad PF,6+ Mos 01/08/2014, 01/25/2015, 01/06/2018  . PFIZER(Purple  Top)SARS-COV-2 Vaccination 04/19/2019, 05/13/2019, 02/08/2020  . Pneumococcal Conjugate-13 12/14/2013  . Pneumococcal Polysaccharide-23 03/31/2003, 03/30/2005  . Td 03/31/2003, 04/06/2017  . Zoster 02/06/2008     Review of Systems is noted in the HPI, as appropriate  Objective:   BP (!) 90/50   Pulse 86   Temp (!) 97.4 F (36.3 C) (Temporal)   Ht 5\' 8"  (1.727 m)   Wt 132 lb 12 oz (60.2 kg)   SpO2 99%   BMI 20.18 kg/m   GEN: No acute distress; alert,appropriate. PULM: Breathing comfortably in no respiratory distress PSYCH: Normally interactive.  CV: RRR, no m/g/r  PULM: Normal respiratory rate, no accessory muscle use. No wheezes, crackles or rhonchi   Laboratory and Imaging Data:  Assessment and Plan:     ICD-10-CM   1. Severe protein-calorie malnutrition ([place])  E43   2. Chronic kidney disease (CKD), stage IV (severe) ([name])  T46.5 Basic metabolic panel  3. Other specified hypothyroidism  E03.8 T3, free    T4, free    TSH  4. Vitamin B 12 deficiency  E53.8 CBC with Differential/Platelet    Vitamin B12  5. Encounter for long-term (current) use of medications  Z79.899 Hepatic function panel  6. Vitamin D deficiency  E55.9 VITAMIN D 25 Hydroxy (Vit-D Deficiency, Fractures)  7. Mixed hyperlipidemia  E78.2 LDL cholesterol, direct   He has gained weight back, approximately 9 pounds.  Continue to eat liberally.  We will check a albumin today.  Follow-up on chronic kidney disease.  He has been relatively stable with some variability over the last few years.  Check thyroid, B12, additional basic labs.  Social Determinants of Health  -he no longer drives an automobile -Limited in terms of his physical activity   Orders Placed This Encounter  Procedures  . Basic metabolic panel  . CBC with Differential/Platelet  . Hepatic function panel  . Vitamin B12  . VITAMIN D 25 Hydroxy (Vit-D Deficiency, Fractures)  . LDL cholesterol, direct  . T3, free  . T4, free  . TSH     Follow-up: Return in about 6 months (around 10/15/2020) for medicare wellness exam.  Signed,  Frederico Hamman T. Meeah Totino, MD   Outpatient Encounter Medications as of 04/17/2020  Medication Sig  . acetaminophen (TYLENOL) 500 MG tablet Take 500 mg by mouth every 6 (six) hours as needed for moderate pain or headache.  . alendronate (FOSAMAX) 70 MG tablet TAKE 1 TABLET BY MOUTH EVERY 7 DAYS WITH A FULL GLASS OF WATER ON AN EMPTY STOMACH  . amiodarone (PACERONE) 200 MG tablet TAKE 1/2 TABLET(100 MG) BY MOUTH DAILY  . Cyanocobalamin (VITAMIN B-12 IJ) Inject 1,000 mcg as directed every 30 (thirty) days.  Marland Kitchen docusate sodium (COLACE) 50 MG capsule Take 50 mg by mouth 2 (two) times daily as needed for mild constipation.  . dorzolamide-timolol (COSOPT) 22.3-6.8 MG/ML ophthalmic solution Place 1 drop into both eyes 2 (two) times daily.  . ferrous sulfate 325 (65 FE) MG EC tablet Take 325 mg by mouth daily with breakfast.  . furosemide (LASIX) 20 MG tablet Take 20 mg by mouth every other day.  . levothyroxine (SYNTHROID) 100 MCG tablet TAKE 1 TABLET BY MOUTH DAILY BEFORE BREAKFAST  . loperamide (IMODIUM) 2 MG capsule Take 2 mg by mouth as needed for diarrhea or loose stools.  . Multiple Vitamin (MULTIVITAMIN) tablet Take 1 tablet by mouth daily.  . pantoprazole (PROTONIX) 40 MG tablet TAKE 1 TABLET(40 MG) BY MOUTH DAILY  . polyethylene glycol (MIRALAX / GLYCOLAX) packet Take 17 g by mouth daily as needed for moderate constipation.   . pravastatin (PRAVACHOL) 40 MG tablet TAKE 1/2 TABLET(20 MG) BY MOUTH AT BEDTIME  . simethicone (MYLICON) 102 MG chewable tablet Chew 125 mg by mouth every 6 (six) hours as needed for flatulence.  . tamsulosin (FLOMAX) 0.4 MG CAPS capsule TAKE 1 CAPSULE BY MOUTH DAILY  . warfarin (COUMADIN) 1 MG tablet TAKE 1 AND 1/2 TABLETS BY MOUTH DAILY AS DIRECTED  . Wheat Dextrin (BENEFIBER DRINK MIX PO) Take 1 scoop by mouth daily.   No facility-administered encounter medications on file  as of 04/17/2020.

## 2020-04-18 ENCOUNTER — Telehealth: Payer: Self-pay | Admitting: *Deleted

## 2020-04-18 LAB — CBC WITH DIFFERENTIAL/PLATELET
Basophils Absolute: 0.1 10*3/uL (ref 0.0–0.1)
Basophils Relative: 1.4 % (ref 0.0–3.0)
Eosinophils Absolute: 0.3 10*3/uL (ref 0.0–0.7)
Eosinophils Relative: 5.1 % — ABNORMAL HIGH (ref 0.0–5.0)
HCT: 34.1 % — ABNORMAL LOW (ref 39.0–52.0)
Hemoglobin: 10.9 g/dL — ABNORMAL LOW (ref 13.0–17.0)
Lymphocytes Relative: 26 % (ref 12.0–46.0)
Lymphs Abs: 1.5 10*3/uL (ref 0.7–4.0)
MCHC: 31.9 g/dL (ref 30.0–36.0)
MCV: 101.6 fl — ABNORMAL HIGH (ref 78.0–100.0)
Monocytes Absolute: 0.6 10*3/uL (ref 0.1–1.0)
Monocytes Relative: 10.8 % (ref 3.0–12.0)
Neutro Abs: 3.2 10*3/uL (ref 1.4–7.7)
Neutrophils Relative %: 56.7 % (ref 43.0–77.0)
Platelets: 177 10*3/uL (ref 150.0–400.0)
RBC: 3.35 Mil/uL — ABNORMAL LOW (ref 4.22–5.81)
RDW: 13.3 % (ref 11.5–15.5)
WBC: 5.6 10*3/uL (ref 4.0–10.5)

## 2020-04-18 LAB — BASIC METABOLIC PANEL
BUN: 51 mg/dL — ABNORMAL HIGH (ref 6–23)
CO2: 26 mEq/L (ref 19–32)
Calcium: 8.8 mg/dL (ref 8.4–10.5)
Chloride: 110 mEq/L (ref 96–112)
Creatinine, Ser: 2.3 mg/dL — ABNORMAL HIGH (ref 0.40–1.50)
GFR: 23.62 mL/min — ABNORMAL LOW (ref >60.00)
Glucose, Bld: 46 mg/dL — CL (ref 70–99)
Potassium: 4.4 mEq/L (ref 3.5–5.1)
Sodium: 140 mEq/L (ref 135–145)

## 2020-04-18 LAB — HEPATIC FUNCTION PANEL
ALT: 13 U/L (ref 0–53)
AST: 18 U/L (ref 0–37)
Albumin: 3.8 g/dL (ref 3.5–5.2)
Alkaline Phosphatase: 84 U/L (ref 39–117)
Bilirubin, Direct: 0.1 mg/dL (ref 0.0–0.3)
Total Bilirubin: 0.5 mg/dL (ref 0.2–1.2)
Total Protein: 6.3 g/dL (ref 6.0–8.3)

## 2020-04-18 LAB — T3, FREE: T3, Free: 1.5 pg/mL — ABNORMAL LOW (ref 2.3–4.2)

## 2020-04-18 LAB — VITAMIN B12: Vitamin B-12: 995 pg/mL — ABNORMAL HIGH (ref 211–911)

## 2020-04-18 LAB — TSH: TSH: 0.59 u[IU]/mL (ref 0.35–4.50)

## 2020-04-18 LAB — LDL CHOLESTEROL, DIRECT: Direct LDL: 50 mg/dL

## 2020-04-18 LAB — VITAMIN D 25 HYDROXY (VIT D DEFICIENCY, FRACTURES): VITD: 53.07 ng/mL (ref 30.00–100.00)

## 2020-04-18 LAB — T4, FREE: Free T4: 1.23 ng/dL (ref 0.60–1.60)

## 2020-04-18 NOTE — Telephone Encounter (Signed)
Spoke with [name] [name] daugther and advised her of the critical glucose level from yesterday.  She states [name] [name] is doing fine.  His blood sugar does tend to drop low every once in a while and when this does happen, they get him something to eat pretty quickly.

## 2020-04-18 NOTE — Telephone Encounter (Signed)
Left message for Mr. Rueth to return my call.  ?

## 2020-04-18 NOTE — Telephone Encounter (Signed)
Esperanza Richters with [facility] called with Critical lab  Glucose was 46  Results given to Dr. Diona Browner and routed to both Dr. Diona Browner and Dr. Lorelei Pont

## 2020-04-18 NOTE — Telephone Encounter (Signed)
Pt daugther returning phone call

## 2020-04-18 NOTE — Telephone Encounter (Signed)
Can you let them know that there should be nothing to worry about.  Just keep eating as much as he wants like we have been talking about.  If he feels lightheaded or that his blood sugar is low he can drink some juice, eat candy, cookies, or any other sugary snack that he likes.

## 2020-04-18 NOTE — Telephone Encounter (Signed)
Please call to check on patient.. make sure he is eating regularly... last OV yesterday with Dr. Lorelei Pont... suggested improved intake. Await PCP recommendations of long term management.

## 2020-04-18 NOTE — Telephone Encounter (Signed)
Left message for Mr. Paulos daughter with below information from Dr. Lorelei Pont.

## 2020-04-23 ENCOUNTER — Ambulatory Visit (INDEPENDENT_AMBULATORY_CARE_PROVIDER_SITE_OTHER): Payer: Medicare Other

## 2020-04-23 DIAGNOSIS — I495 Sick sinus syndrome: Secondary | ICD-10-CM

## 2020-04-23 LAB — CUP PACEART REMOTE DEVICE CHECK
Battery Remaining Longevity: 71 mo
Battery Voltage: 3 V
Brady Statistic AP VP Percent: 3.81 %
Brady Statistic AP VS Percent: 95.44 %
Brady Statistic AS VP Percent: 0.22 %
Brady Statistic AS VS Percent: 0.53 %
Brady Statistic RA Percent Paced: 99.18 %
Brady Statistic RV Percent Paced: 4.31 %
Date Time Interrogation Session: 20220125103344
Implantable Lead Implant Date: 20070525
Implantable Lead Implant Date: 20070525
Implantable Lead Location: 753859
Implantable Lead Location: 753860
Implantable Lead Model: 5076
Implantable Lead Model: 5076
Implantable Pulse Generator Implant Date: 20171127
Lead Channel Impedance Value: 361 Ohm
Lead Channel Impedance Value: 361 Ohm
Lead Channel Impedance Value: 380 Ohm
Lead Channel Impedance Value: 399 Ohm
Lead Channel Pacing Threshold Amplitude: 0.75 V
Lead Channel Pacing Threshold Amplitude: 0.75 V
Lead Channel Pacing Threshold Pulse Width: 0.4 ms
Lead Channel Pacing Threshold Pulse Width: 0.4 ms
Lead Channel Sensing Intrinsic Amplitude: 1.25 mV
Lead Channel Sensing Intrinsic Amplitude: 1.25 mV
Lead Channel Sensing Intrinsic Amplitude: 1.25 mV
Lead Channel Sensing Intrinsic Amplitude: 1.25 mV
Lead Channel Setting Pacing Amplitude: 2 V
Lead Channel Setting Pacing Amplitude: 2.5 V
Lead Channel Setting Pacing Pulse Width: 0.4 ms
Lead Channel Setting Sensing Sensitivity: 0.6 mV

## 2020-04-24 ENCOUNTER — Ambulatory Visit (INDEPENDENT_AMBULATORY_CARE_PROVIDER_SITE_OTHER): Payer: Medicare Other

## 2020-04-24 ENCOUNTER — Other Ambulatory Visit: Payer: Self-pay

## 2020-04-24 DIAGNOSIS — I480 Paroxysmal atrial fibrillation: Secondary | ICD-10-CM

## 2020-04-24 DIAGNOSIS — Z5181 Encounter for therapeutic drug level monitoring: Secondary | ICD-10-CM

## 2020-04-24 LAB — POCT INR: INR: 2.8 (ref 2.0–3.0)

## 2020-04-24 NOTE — Patient Instructions (Signed)
-   continue warfarin dosage of 1.5 tablets every day EXCEPT 1 tablet on MONDAYS, WEDNESDAYS & FRIDAYS - recheck in 6 weeks.

## 2020-04-30 ENCOUNTER — Other Ambulatory Visit: Payer: Self-pay | Admitting: Internal Medicine

## 2020-04-30 ENCOUNTER — Other Ambulatory Visit: Payer: Self-pay | Admitting: Family Medicine

## 2020-05-04 NOTE — Progress Notes (Signed)
Remote pacemaker transmission.   

## 2020-05-21 ENCOUNTER — Telehealth: Payer: Self-pay | Admitting: Family Medicine

## 2020-05-21 NOTE — Telephone Encounter (Signed)
LVM for pt to rtn my call to schedule AWV with [facility].  

## 2020-05-22 ENCOUNTER — Ambulatory Visit (INDEPENDENT_AMBULATORY_CARE_PROVIDER_SITE_OTHER): Payer: Medicare Other

## 2020-05-22 DIAGNOSIS — E538 Deficiency of other specified B group vitamins: Secondary | ICD-10-CM

## 2020-05-22 MED ORDER — CYANOCOBALAMIN 1000 MCG/ML IJ SOLN
1000.0000 ug | Freq: Once | INTRAMUSCULAR | Status: AC
  Administered 2020-05-22: 1000 ug via INTRAMUSCULAR

## 2020-05-22 NOTE — Progress Notes (Signed)
Per orders of Dr. Lorelei Pont, injection of B12, given by Aneta Mins, RN. Patient tolerated injection well in L Deltoid.

## 2020-06-05 ENCOUNTER — Ambulatory Visit (INDEPENDENT_AMBULATORY_CARE_PROVIDER_SITE_OTHER): Payer: Medicare Other

## 2020-06-05 ENCOUNTER — Other Ambulatory Visit: Payer: Self-pay

## 2020-06-05 DIAGNOSIS — I480 Paroxysmal atrial fibrillation: Secondary | ICD-10-CM | POA: Diagnosis not present

## 2020-06-05 DIAGNOSIS — Z5181 Encounter for therapeutic drug level monitoring: Secondary | ICD-10-CM

## 2020-06-05 LAB — POCT INR: INR: 3.9 — AB (ref 2.0–3.0)

## 2020-06-05 NOTE — Patient Instructions (Signed)
-   skip warfarin tonight, then  - continue warfarin dosage of 1.5 tablets every day EXCEPT 1 tablet on MONDAYS, [place] - recheck in 4 weeks.

## 2020-06-18 ENCOUNTER — Ambulatory Visit (INDEPENDENT_AMBULATORY_CARE_PROVIDER_SITE_OTHER): Payer: Medicare Other | Admitting: Internal Medicine

## 2020-06-18 ENCOUNTER — Encounter: Payer: Self-pay | Admitting: Internal Medicine

## 2020-06-18 ENCOUNTER — Other Ambulatory Visit: Payer: Self-pay

## 2020-06-18 VITALS — BP 120/70 | HR 76 | Ht 69.0 in | Wt 136.5 lb

## 2020-06-18 DIAGNOSIS — I495 Sick sinus syndrome: Secondary | ICD-10-CM

## 2020-06-18 DIAGNOSIS — Z79899 Other long term (current) drug therapy: Secondary | ICD-10-CM

## 2020-06-18 DIAGNOSIS — Z950 Presence of cardiac pacemaker: Secondary | ICD-10-CM

## 2020-06-18 DIAGNOSIS — I480 Paroxysmal atrial fibrillation: Secondary | ICD-10-CM

## 2020-06-18 LAB — PACEMAKER DEVICE OBSERVATION

## 2020-06-18 NOTE — Progress Notes (Signed)
Electrophysiology Office Note    Date:  06/18/2020   ID:  [name] [name], DOB 1922-11-04, MRN [id number]  PCP:  Owens Loffler, MD  Cardiologist:  Primary Electrophysiologist:  Virl Axe, MD      History of Present Illness: [name] [name] is a 87 y.o. male is seen today in followup for  pacemaker implanted for tachybradycardia syndrome in  the context of paroxysmal atrial fibrillation with prior failed ablation. He takes amiodarone and anticoagulation w coumadin.    He underwent device generator replacement 11/17   Fatigued and sleepy but denies dyspnea chest pain.  No orthopnea or nocturnal dyspnea.  No palpitations.  Chronic edema.  With renal insufficiency gentle diuresis has been managed carefully by his PCP    Date Cr K TSH LFTs Hgb PFTs  7/17    0.71      8/18     12.3   1/19    0.3 15 10.5    8/19 1.93 3.8 0.21  10.4   1/21 2.0 4.7 2.01 19 10.4   1/22 2.3 4.4 059 13 10.9        Past Medical History:  Diagnosis Date  . AICD (automatic cardioverter/defibrillator) present    PACEMAKER  . Anemia   . Cancer ([place])   . Cardiac pacemaker in situ 07/2005   symptomatic bradycardia  . CKD (chronic kidney disease) stage 3, GFR 30-59 ml/min ([name]) 04/13/2017  . Diverticulosis of colon (without mention of hemorrhage)   . Dysrhythmia   . 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 GERD (gastroesophageal reflux disease)   . GI hemorrhage   . Hyperlipidemia   . Hypertension   . Lower extremity edema   . Neck arthritis, Severe, multi-level 06/09/2013  . Paroxysmal atrial fibrillation ([name])    s/p failed ablation  . Peptic ulcer, unspecified site, unspecified as acute or chronic, without mention of hemorrhage, perforation, or obstruction 1975  . Personal history of malignant neoplasm of prostate 12/2000   5 weeks radiation, radiation seeds  . Radiation proctitis   . Sinoatrial node dysfunction ([name])   .  Unspecified glaucoma(365.9)   . Unspecified hypothyroidism   . Vitamin B12 deficiency    Past Surgical History:  Procedure Laterality Date  . CARDIAC CATHETERIZATION    . CARDIAC ELECTROPHYSIOLOGY [place] AND ABLATION  2001, 2003   failure  . CATARACT EXTRACTION    . CYSTOGRAM N/A 09/21/2018   Procedure: CYSTOGRAM;  Surgeon: Abbie Sons, MD;  Location: [facility] ORS;  Service: Urology;  Laterality: N/A;  . CYSTOSCOPY WITH URETHRAL DILATATION N/A 01/29/2017   Procedure: CYSTOSCOPY WITH URETHRAL DILATATION;  Surgeon: Abbie Sons, MD;  Location: [facility] ORS;  Service: Urology;  Laterality: N/A;  . CYSTOSCOPY WITH URETHRAL DILATATION N/A 09/21/2018   Procedure: CYSTOSCOPY WITH URETHRAL DILATATION;  Surgeon: Abbie Sons, MD;  Location: [facility] ORS;  Service: Urology;  Laterality: N/A;  . EP IMPLANTABLE DEVICE N/A 02/24/2016   Procedure: PPM Generator Changeout;  Surgeon: Deboraha Sprang, MD;  Location: [place] [facility];  Service: Cardiovascular;  Laterality: N/A;  . gastric ulcer surgery    . HERNIA REPAIR  1994, 2001, 2003   s/p drainage and complications, 08/9627, 07/2839 mesh removal  . HIP ARTHROPLASTY Right 02/16/2019   Procedure: RIGHT ANTERIOR HIP HEMIARTHROPLASTY,CEMENTED;  Surgeon: Hessie Knows, MD;  Location: [facility] ORS;  Service: Orthopedics;  Laterality: Right;  . INSERT / REPLACE /  REMOVE PACEMAKER  07/2005   symptomatic bradycardia  . kidney stones    . NOSE SURGERY     cancer removed   . PROSTATE SURGERY    . SKIN CANCER DESTRUCTION    . TOOTH EXTRACTION       Current Outpatient Medications  Medication Sig Dispense Refill  . acetaminophen (TYLENOL) 500 MG tablet Take 500 mg by mouth every 6 (six) hours as needed for moderate pain or headache.    . alendronate (FOSAMAX) 70 MG tablet TAKE 1 TABLET BY MOUTH EVERY 7 DAYS WITH A FULL GLASS OF WATER AND ON AN EMPTY STOMACH 12 tablet 3  . amiodarone (PACERONE) 200 MG tablet TAKE 1/2 TABLET(100 MG) BY MOUTH DAILY 45 tablet 3  .  Cyanocobalamin (VITAMIN B-12 IJ) Inject 1,000 mcg as directed every 30 (thirty) days.    Marland Kitchen docusate sodium (COLACE) 50 MG capsule Take 50 mg by mouth 2 (two) times daily as needed for mild constipation.    . dorzolamide-timolol (COSOPT) 22.3-6.8 MG/ML ophthalmic solution Place 1 drop into both eyes 2 (two) times daily.    . ferrous sulfate 325 (65 FE) MG EC tablet Take 325 mg by mouth daily with breakfast.    . furosemide (LASIX) 20 MG tablet Take 20 mg by mouth every other day.    . levothyroxine (SYNTHROID) 100 MCG tablet TAKE 1 TABLET BY MOUTH DAILY BEFORE BREAKFAST 90 tablet 3  . loperamide (IMODIUM) 2 MG capsule Take 2 mg by mouth as needed for diarrhea or loose stools.    . Multiple Vitamin (MULTIVITAMIN) tablet Take 1 tablet by mouth daily.    . pantoprazole (PROTONIX) 40 MG tablet TAKE 1 TABLET(40 MG) BY MOUTH DAILY 90 tablet 3  . polyethylene glycol (MIRALAX / GLYCOLAX) packet Take 17 g by mouth daily as needed for moderate constipation.     . pravastatin (PRAVACHOL) 40 MG tablet TAKE 1/2 TABLET(20 MG) BY MOUTH AT BEDTIME 45 tablet 3  . simethicone (MYLICON) 761 MG chewable tablet Chew 125 mg by mouth every 6 (six) hours as needed for flatulence.    . tamsulosin (FLOMAX) 0.4 MG CAPS capsule TAKE 1 CAPSULE BY MOUTH DAILY 90 capsule 3  . warfarin (COUMADIN) 1 MG tablet Take 1 to 1 and 1/2 tablets by mouth daily as directed by the coumadin clinic. 45 tablet 2  . Wheat Dextrin (BENEFIBER DRINK MIX PO) Take 1 scoop by mouth daily.     No current facility-administered medications for this visit.     Allergies:   Penicillins      PHYSICAL EXAM: VS:  BP 120/70 (BP Location: Left Arm, Patient Position: Sitting, Cuff Size: Normal)   Pulse 76   Ht 5\' 9"  (1.753 m)   Wt 136 lb 8 oz (61.9 kg)   SpO2 98%   BMI 20.16 kg/m  , BMI Body mass index is 20.16 kg/m. Well developed and well nourished in no acute distress HENT normal Neck supple with JVP-flat Clear Device pocket well healed;  without hematoma or erythema.  There is no tethering  Regular rate and rhythm, n0 gallop 2/6 murmur Abd-soft with active BS No Clubbing cyanosis tr edema Skin-warm and dry A & Oriented  Grossly normal sensory and motor function  ECG apacing 76 35/09/38   ASSESSMENT AND PLAN:  Atrial fibrillation - paroxysmal  Sinus node dysfunction   Pacemaker  Medtronic  The patient's device was interrogated.  The information was reviewed. No changes were made in the programming.  Hypothyroidism-treated  High Risk Medication Surveillance Amiodarone therapy  anemia.  Renal dysfunction gd  4  (GFR 18)   Infrequent and asymptomatic atrial fibrillation.  Continue amiodarone low-dose.  Tolerating.  Surveillance labs are within range.  No bleeding on warfarin.  Chronic edema.  Managed by PCP trying to balance with renal insufficiency       Signed, Virl Axe, MD  06/18/2020 11:37 AM     [facility] [address] [place] [address] [phone] (office) [phone] (fax)

## 2020-06-18 NOTE — Patient Instructions (Signed)

## 2020-06-25 ENCOUNTER — Ambulatory Visit (INDEPENDENT_AMBULATORY_CARE_PROVIDER_SITE_OTHER): Payer: Medicare Other

## 2020-06-25 ENCOUNTER — Other Ambulatory Visit: Payer: Self-pay

## 2020-06-25 DIAGNOSIS — Z0000 Encounter for general adult medical examination without abnormal findings: Secondary | ICD-10-CM | POA: Diagnosis not present

## 2020-06-25 NOTE — Progress Notes (Signed)
Subjective:   [name] [name] is a 87 y.o. male who presents for Medicare Annual/Subsequent preventive examination.  Review of Systems: N/A      I connected with the patient today by telephone and verified that I am speaking with the correct person using two identifiers. Location patient: home Location nurse: work Persons participating in the telephone visit: patient, nurse.   I discussed the limitations, risks, security and privacy concerns of performing an evaluation and management service by telephone and the availability of in person appointments. I also discussed with the patient that there may be a patient responsible charge related to this service. The patient expressed understanding and verbally consented to this telephonic visit.        Cardiac Risk Factors include: advanced age (>59men, >24 women);male gender;hypertension;Other (see comment), Risk factor comments: hyperlipidemia     Objective:    Today's Vitals   There is no height or weight on file to calculate BMI.  Advanced Directives 06/25/2020 04/12/2019 03/22/2019 02/17/2019 02/14/2019 09/21/2018 09/18/2018  Does Patient Have a Medical Advance Directive? Yes Yes Yes - No Yes Yes  Type of Paramedic of [place];Living will [place];Living will [place];Living will - - Living will Living will  Does patient want to make changes to medical advance directive? - - No - Patient declined - - No - Patient declined No - Patient declined  Copy of [place] in Chart? Yes - validated most recent copy scanned in chart (See row information) Yes - validated most recent copy scanned in chart (See row information) - - - - -  Would patient like information on creating a medical advance directive? - - - No - Patient declined - - -    Current Medications (verified) Outpatient Encounter Medications as of 06/25/2020  Medication Sig  . acetaminophen (TYLENOL)  500 MG tablet Take 500 mg by mouth every 6 (six) hours as needed for moderate pain or headache.  . alendronate (FOSAMAX) 70 MG tablet TAKE 1 TABLET BY MOUTH EVERY 7 DAYS WITH A FULL GLASS OF WATER AND ON AN EMPTY STOMACH  . amiodarone (PACERONE) 200 MG tablet TAKE 1/2 TABLET(100 MG) BY MOUTH DAILY  . Cyanocobalamin (VITAMIN B-12 IJ) Inject 1,000 mcg as directed every 30 (thirty) days.  Marland Kitchen docusate sodium (COLACE) 50 MG capsule Take 50 mg by mouth 2 (two) times daily as needed for mild constipation.  . dorzolamide-timolol (COSOPT) 22.3-6.8 MG/ML ophthalmic solution Place 1 drop into both eyes 2 (two) times daily.  . ferrous sulfate 325 (65 FE) MG EC tablet Take 325 mg by mouth daily with breakfast.  . furosemide (LASIX) 20 MG tablet Take 20 mg by mouth every other day.  . levothyroxine (SYNTHROID) 100 MCG tablet TAKE 1 TABLET BY MOUTH DAILY BEFORE BREAKFAST  . loperamide (IMODIUM) 2 MG capsule Take 2 mg by mouth as needed for diarrhea or loose stools.  . Multiple Vitamin (MULTIVITAMIN) tablet Take 1 tablet by mouth daily.  . pantoprazole (PROTONIX) 40 MG tablet TAKE 1 TABLET(40 MG) BY MOUTH DAILY  . polyethylene glycol (MIRALAX / GLYCOLAX) packet Take 17 g by mouth daily as needed for moderate constipation.   . pravastatin (PRAVACHOL) 40 MG tablet TAKE 1/2 TABLET(20 MG) BY MOUTH AT BEDTIME  . simethicone (MYLICON) 332 MG chewable tablet Chew 125 mg by mouth every 6 (six) hours as needed for flatulence.  . tamsulosin (FLOMAX) 0.4 MG CAPS capsule TAKE 1 CAPSULE BY MOUTH DAILY  .  warfarin (COUMADIN) 1 MG tablet Take 1 to 1 and 1/2 tablets by mouth daily as directed by the coumadin clinic.  . Wheat Dextrin (BENEFIBER DRINK MIX PO) Take 1 scoop by mouth daily.   No facility-administered encounter medications on file as of 06/25/2020.    Allergies (verified) Penicillins   History: Past Medical History:  Diagnosis Date  . AICD (automatic cardioverter/defibrillator) present    PACEMAKER  . Anemia    . Cancer ([place])   . Cardiac pacemaker in situ 07/2005   symptomatic bradycardia  . CKD (chronic kidney disease) stage 3, GFR 30-59 ml/min ([name]) 04/13/2017  . Diverticulosis of colon (without mention of hemorrhage)   . Dysrhythmia   . 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 GERD (gastroesophageal reflux disease)   . GI hemorrhage   . Hyperlipidemia   . Hypertension   . Lower extremity edema   . Neck arthritis, Severe, multi-level 06/09/2013  . Paroxysmal atrial fibrillation ([name])    s/p failed ablation  . Peptic ulcer, unspecified site, unspecified as acute or chronic, without mention of hemorrhage, perforation, or obstruction 1975  . Personal history of malignant neoplasm of prostate 12/2000   5 weeks radiation, radiation seeds  . Radiation proctitis   . Sinoatrial node dysfunction ([name])   . Unspecified glaucoma(365.9)   . Unspecified hypothyroidism   . Vitamin B12 deficiency    Past Surgical History:  Procedure Laterality Date  . CARDIAC CATHETERIZATION    . CARDIAC ELECTROPHYSIOLOGY [place] AND ABLATION  2001, 2003   failure  . CATARACT EXTRACTION    . CYSTOGRAM N/A 09/21/2018   Procedure: CYSTOGRAM;  Surgeon: Abbie Sons, MD;  Location: [facility] ORS;  Service: Urology;  Laterality: N/A;  . CYSTOSCOPY WITH URETHRAL DILATATION N/A 01/29/2017   Procedure: CYSTOSCOPY WITH URETHRAL DILATATION;  Surgeon: Abbie Sons, MD;  Location: [facility] OR[facility];  Service: Urology;  Laterality: N/A;  . CYSTOSCOPY WITH URETHRAL DILATATION N/A 09/21/2018   Procedure: CYSTOSCOPY WITH URETHRAL DILATATION;  Surgeon: Abbie Sons, MD;  Location: [facility] OR[facility];  Service: Urology;  Laterality: N/A;  . EP IMPLANTABLE DEVICE N/A 02/24/2016   Procedure: PPM Generator Changeout;  Surgeon: Deboraha Sprang, MD;  Location: [facility];  Service: Cardiovascular;  Laterality: N/A;  . gastric ulcer surgery    . HERNIA REPAIR  1994, 2001, 2003    s/p drainage and complications, 10/7865, 08/7207 mesh removal  . HIP ARTHROPLASTY Right 02/16/2019   Procedure: RIGHT ANTERIOR HIP HEMIARTHROPLASTY,CEMENTED;  Surgeon: Hessie Knows, MD;  Location: [facility] OR[facility];  Service: Orthopedics;  Laterality: Right;  . INSERT / REPLACE / REMOVE PACEMAKER  07/2005   symptomatic bradycardia  . kidney stones    . NOSE SURGERY     cancer removed   . PROSTATE SURGERY    . SKIN CANCER DESTRUCTION    . TOOTH EXTRACTION     Family History  Problem Relation Age of Onset  . Breast cancer Mother   . Bone cancer Father   . Alcohol abuse Other   . Coronary artery disease Other   . Dementia Other   . Diabetes Other    Social History   Socioeconomic History  . Marital status: Widowed    Spouse name: Not on file  . Number of children: Not on file  . Years of education: Not on file  . Highest education level: Not on file  Occupational History  .  Not on file  Tobacco Use  . Smoking status: Never Smoker  . Smokeless tobacco: Never Used  Vaping Use  . Vaping Use: Never used  Substance and Sexual Activity  . Alcohol use: No  . Drug use: No  . Sexual activity: Yes    Birth control/protection: None  Other Topics Concern  . Not on file  Social History Narrative  . Not on file   Social Determinants of Health   Financial Resource Strain: Low Risk   . Difficulty of Paying Living Expenses: Not hard at all  Food Insecurity: No Food Insecurity  . Worried About Charity fundraiser in the Last Year: Never true  . Ran Out of Food in the Last Year: Never true  Transportation Needs: No Transportation Needs  . Lack of Transportation (Medical): No  . Lack of Transportation (Non-Medical): No  Physical Activity: Inactive  . Days of Exercise per Week: 0 days  . Minutes of Exercise per Session: 0 min  Stress: No Stress Concern Present  . Feeling of Stress : Not at all  Social Connections: Not on file    Tobacco Counseling Counseling given: Not  Answered   Clinical Intake:  Pre-visit preparation completed: Yes  Pain : No/denies pain     Nutritional Risks: None Diabetes: No  How often do you need to have someone help you when you read instructions, pamphlets, or other written materials from your doctor or pharmacy?: 1 - Never  Diabetic: No Nutrition Risk Assessment:  Has the patient had any N/V/D within the last 2 months?  No  Does the patient have any non-healing wounds?  No  Has the patient had any unintentional weight loss or weight gain?  No   Diabetes:  Is the patient diabetic?  No  If diabetic, was a CBG obtained today?  N/A Did the patient bring in their glucometer from home?  N/A How often do you monitor your CBG's? N/A.   Financial Strains and Diabetes Management:  Are you having any financial strains with the device, your supplies or your medication? N/A.  Does the patient want to be seen by Chronic Care Management for management of their diabetes?  N/A Would the patient like to be referred to a Nutritionist or for Diabetic Management?  N/A   Interpreter Needed?: No  Information entered by :: CJohnson, LPN   Activities of Daily Living In your present state of health, do you have any difficulty performing the following activities: 06/25/2020  Hearing? Y  Comment Patient is very hard of hearing  Vision? N  Difficulty concentrating or making decisions? Y  Walking or climbing stairs? N  Dressing or bathing? N  Doing errands, shopping? Y  Preparing Food and eating ? N  Using the Toilet? N  In the past six months, have you accidently leaked urine? Y  Comment wears depends  Do you have problems with loss of bowel control? N  Managing your Medications? N  Managing your Finances? N  Housekeeping or managing your Housekeeping? Y  Some recent data might be hidden    Patient Care Team: Owens Loffler, MD as PCP - General (Family Medicine) Deboraha Sprang, MD as PCP - Cardiology  (Cardiology)  Indicate any recent Medical Services you may have received from other than [facility] providers in the past year (date may be approximate).     Assessment:   This is a routine wellness examination for [place].  Hearing/Vision screen  Hearing Screening   125Hz  250Hz   500Hz  1000Hz  2000Hz  3000Hz  4000Hz  6000Hz  8000Hz   Right ear:           Left ear:           Vision Screening Comments: Patient gets annual eye exams   Dietary issues and exercise activities discussed: Current Exercise Habits: The patient does not participate in regular exercise at present, Exercise limited by: None identified  Goals    . DIET - INCREASE WATER INTAKE     Starting 04/08/2018, I will continue to drink at least 10 glasses of water daily.     . Patient Stated     04/12/2019, I will maintain and continue medications as prescribed.    . Patient Stated     06/25/2020, I will maintain and continue medications as prescribed.      Depression Screen PHQ 2/9 Scores 06/25/2020 04/17/2020 04/12/2019 03/16/2019 04/08/2018 04/06/2017 03/18/2016  PHQ - 2 Score 0 0 0 0 0 0 0  PHQ- 9 Score 0 - 0 - 0 0 -    Fall Risk Fall Risk  06/25/2020 04/17/2020 04/12/2019 03/28/2019 03/22/2019  Falls in the past year? 0 0 1 (No Data) (No Data)  Comment - - tripped and fell Falls screenning previously completed- patient denies new/ recent falls Falls screening previously completed; denies new/ recent falls  Number falls in past yr: 0 - 0 - -  Comment - - broke hip and arm - -  Injury with Fall? 0 - 0 - -  Comment - - broke hip and arm - -  Risk for fall due to : Medication side effect;Impaired balance/gait - Medication side effect;History of fall(s) History of fall(s);Impaired mobility -  Follow up Falls evaluation completed;Falls prevention discussed - Falls evaluation completed;Falls prevention discussed Falls prevention discussed Falls prevention discussed    FALL RISK PREVENTION PERTAINING TO THE HOME:  Any stairs in or  around the home? Yes  If so, are there any without handrails? No  Home free of loose throw rugs in walkways, pet beds, electrical cords, etc? Yes  Adequate lighting in your home to reduce risk of falls? Yes   ASSISTIVE DEVICES UTILIZED TO PREVENT FALLS:  Life alert? No  Use of a cane, walker or w/c? Yes  Grab bars in the bathroom? Yes  Shower chair or bench in shower? Yes  Elevated toilet seat or a handicapped toilet? No   TIMED UP AND GO:  Was the test performed? N/A telephone visit .   Cognitive Function: MMSE - Mini Mental State Exam 06/25/2020 04/12/2019 04/08/2018 04/06/2017  Not completed: Unable to complete - - -  Orientation to time - 5 5 5   Orientation to Place - 5 5 5   Registration - 3 3 3   Attention/ Calculation - 5 0 0  Recall - 0 3 3  Language- name 2 objects - - 0 0  Language- repeat - 1 1 1   Language- follow 3 step command - - 3 3  Language- read & follow direction - - 0 0  Write a sentence - - 0 0  Copy design - - 0 0  Total score - - 20 20  Mini Cog  Mini-Cog screen was not completed. Patient very hard of hearing. Maximum score is 22. A value of 0 denotes this part of the MMSE was not completed or the patient failed this part of the Mini-Cog screening.       Immunizations Immunization History  Administered Date(s) Administered  . Fluad Quad(high Dose 65+) 01/04/2019, 12/07/2019  .  H1N1 05/09/2008  . Influenza Split 02/10/2011  . Influenza Whole 01/15/2009, 01/13/2010  . Influenza, High Dose Seasonal PF 12/21/2012, 02/23/2017  . Influenza,inj,Quad PF,6+ Mos 01/08/2014, 01/25/2015, 01/06/2018  . PFIZER(Purple Top)SARS-COV-2 Vaccination 04/19/2019, 05/13/2019, 02/08/2020  . Pneumococcal Conjugate-13 12/14/2013  . Pneumococcal Polysaccharide-23 03/31/2003, 03/30/2005  . Td 03/31/2003, 04/06/2017  . Zoster 02/06/2008    TDAP status: Up to date  Flu Vaccine status: Up to date  Pneumococcal vaccine status: Up to date  Covid-19 vaccine status:  Completed vaccines  Qualifies for Shingles Vaccine? Yes   Zostavax completed Yes   Shingrix Completed?: No.    Education has been provided regarding the importance of this vaccine. Patient has been advised to call insurance company to determine out of pocket expense if they have not yet received this vaccine. Advised may also receive vaccine at local pharmacy or Health Dept. Verbalized acceptance and understanding.  Screening Tests Health Maintenance  Topic Date Due  . COVID-19 Vaccine (4 - Booster for Pfizer series) 08/07/2020  . TETANUS/TDAP  04/07/2027  . INFLUENZA VACCINE  Completed  . PNA vac Low Risk Adult  Completed  . HPV VACCINES  Aged Out    Health Maintenance  There are no preventive care reminders to display for this patient.  Colorectal cancer screening: No longer required.   Lung Cancer Screening: (Low Dose CT Chest recommended if Age 78-80 years, 30 pack-year currently smoking OR have quit w/in 15years.) does not qualify.    Additional Screening:  Hepatitis C Screening: does not qualify; Completed N/A  Vision Screening: Recommended annual ophthalmology exams for early detection of glaucoma and other disorders of the eye. Is the patient up to date with their annual eye exam?  Yes  Who is the provider or what is the name of the office in which the patient attends annual eye exams? [facility] If pt is not established with a provider, would they like to be referred to a provider to establish care? No .   Dental Screening: Recommended annual dental exams for proper oral hygiene  Community Resource Referral / Chronic Care Management: CRR required this visit?  No   CCM required this visit?  No      Plan:     I have personally reviewed and noted the following in the patient's chart:   . Medical and social history . Use of alcohol, tobacco or illicit drugs  . Current medications and supplements . Functional ability and status . Nutritional  status . Physical activity . Advanced directives . List of other physicians . Hospitalizations, surgeries, and ER visits in previous 12 months . Vitals . Screenings to include cognitive, depression, and falls . Referrals and appointments  In addition, I have reviewed and discussed with patient certain preventive protocols, quality metrics, and best practice recommendations. A written personalized care plan for preventive services as well as general preventive health recommendations were provided to patient.   Due to this being a telephonic visit, the after visit summary with patients personalized plan was offered to patient via office or my-chart. Patient preferred to pick up at office at next visit or via mychart.   Andrez Grime, LPN   06/29/270

## 2020-06-25 NOTE — Progress Notes (Signed)
PCP notes:  Health Maintenance: No gaps noted   Abnormal Screenings: none   Patient concerns: none   Nurse concerns: none   Next PCP appt.: 10/16/2020 @ 2 pm

## 2020-06-25 NOTE — Patient Instructions (Signed)
[name] , Thank you for taking time to come for your Medicare Wellness Visit. I appreciate your ongoing commitment to your health goals. Please review the following plan we discussed and let me know if I can assist you in the future.   Screening recommendations/referrals: Colonoscopy: no longer required  Recommended yearly ophthalmology/optometry visit for glaucoma screening and checkup Recommended yearly dental visit for hygiene and checkup  Vaccinations: Influenza vaccine: Up to date, completed 12/07/2019, due 10/2020 Pneumococcal vaccine: Completed series Tdap vaccine: Up to date, completed 04/06/2017, due 03/2027 Shingles vaccine: due, check with your insurance regarding coverage if interested    Covid-19: Completed series  Advanced directives: copy in chart  Conditions/risks identified: hypertension, hyperlipidemia   Next appointment: Follow up in one year for your annual wellness visit.   Preventive Care 110 Years and Older, Male Preventive care refers to lifestyle choices and visits with your health care provider that can promote health and wellness. What does preventive care include?  A yearly physical exam. This is also called an annual well check.  Dental exams once or twice a year.  Routine eye exams. Ask your health care provider how often you should have your eyes checked.  Personal lifestyle choices, including:  Daily care of your teeth and gums.  Regular physical activity.  Eating a healthy diet.  Avoiding tobacco and drug use.  Limiting alcohol use.  Practicing safe sex.  Taking low doses of aspirin every day.  Taking vitamin and mineral supplements as recommended by your health care provider. What happens during an annual well check? The services and screenings done by your health care provider during your annual well check will depend on your age, overall health, lifestyle risk factors, and family history of disease. Counseling  Your health care  provider may ask you questions about your:  Alcohol use.  Tobacco use.  Drug use.  Emotional well-being.  Home and relationship well-being.  Sexual activity.  Eating habits.  History of falls.  Memory and ability to understand (cognition).  Work and work Statistician. Screening  You may have the following tests or measurements:  Height, weight, and BMI.  Blood pressure.  Lipid and cholesterol levels. These may be checked every 5 years, or more frequently if you are over 3 years old.  Skin check.  Lung cancer screening. You may have this screening every year starting at age 82 if you have a 30-pack-year history of smoking and currently smoke or have quit within the past 15 years.  Fecal occult blood test (FOBT) of the stool. You may have this test every year starting at age 1.  Flexible sigmoidoscopy or colonoscopy. You may have a sigmoidoscopy every 5 years or a colonoscopy every 10 years starting at age 15.  Prostate cancer screening. Recommendations will vary depending on your family history and other risks.  Hepatitis C blood test.  Hepatitis B blood test.  Sexually transmitted disease (STD) testing.  Diabetes screening. This is done by checking your blood sugar (glucose) after you have not eaten for a while (fasting). You may have this done every 1-3 years.  Abdominal aortic aneurysm (AAA) screening. You may need this if you are a current or former smoker.  Osteoporosis. You may be screened starting at age 30 if you are at high risk. Talk with your health care provider about your test results, treatment options, and if necessary, the need for more tests. Vaccines  Your health care provider may recommend certain vaccines, such as:  Influenza  vaccine. This is recommended every year.  Tetanus, diphtheria, and acellular pertussis (Tdap, Td) vaccine. You may need a Td booster every 10 years.  Zoster vaccine. You may need this after age 46.  Pneumococcal  13-valent conjugate (PCV13) vaccine. One dose is recommended after age 22.  Pneumococcal polysaccharide (PPSV23) vaccine. One dose is recommended after age 2. Talk to your health care provider about which screenings and vaccines you need and how often you need them. This information is not intended to replace advice given to you by your health care provider. Make sure you discuss any questions you have with your health care provider. Document Released: 04/12/2015 Document Revised: 12/04/2015 Document Reviewed: 01/15/2015 Elsevier Interactive Patient Education  2017 [place] Prevention in the Home Falls can cause injuries. They can happen to people of all ages. There are many things you can do to make your home safe and to help prevent falls. What can I do on the outside of my home?  Regularly fix the edges of walkways and driveways and fix any cracks.  Remove anything that might make you trip as you walk through a door, such as a raised step or threshold.  Trim any bushes or trees on the path to your home.  Use bright outdoor lighting.  Clear any walking paths of anything that might make someone trip, such as rocks or tools.  Regularly check to see if handrails are loose or broken. Make sure that both sides of any steps have handrails.  Any raised decks and porches should have guardrails on the edges.  Have any leaves, snow, or ice cleared regularly.  Use sand or salt on walking paths during winter.  Clean up any spills in your garage right away. This includes oil or grease spills. What can I do in the bathroom?  Use night lights.  Install grab bars by the toilet and in the tub and shower. Do not use towel bars as grab bars.  Use non-skid mats or decals in the tub or shower.  If you need to sit down in the shower, use a plastic, non-slip stool.  Keep the floor dry. Clean up any water that spills on the floor as soon as it happens.  Remove soap buildup in the  tub or shower regularly.  Attach bath mats securely with double-sided non-slip rug tape.  Do not have throw rugs and other things on the floor that can make you trip. What can I do in the bedroom?  Use night lights.  Make sure that you have a light by your bed that is easy to reach.  Do not use any sheets or blankets that are too big for your bed. They should not hang down onto the floor.  Have a firm chair that has side arms. You can use this for support while you get dressed.  Do not have throw rugs and other things on the floor that can make you trip. What can I do in the kitchen?  Clean up any spills right away.  Avoid walking on wet floors.  Keep items that you use a lot in easy-to-reach places.  If you need to reach something above you, use a strong step stool that has a grab bar.  Keep electrical cords out of the way.  Do not use floor polish or wax that makes floors slippery. If you must use wax, use non-skid floor wax.  Do not have throw rugs and other things on the floor that can  make you trip. What can I do with my stairs?  Do not leave any items on the stairs.  Make sure that there are handrails on both sides of the stairs and use them. Fix handrails that are broken or loose. Make sure that handrails are as long as the stairways.  Check any carpeting to make sure that it is firmly attached to the stairs. Fix any carpet that is loose or worn.  Avoid having throw rugs at the top or bottom of the stairs. If you do have throw rugs, attach them to the floor with carpet tape.  Make sure that you have a light switch at the top of the stairs and the bottom of the stairs. If you do not have them, ask someone to add them for you. What else can I do to help prevent falls?  Wear shoes that:  Do not have high heels.  Have rubber bottoms.  Are comfortable and fit you well.  Are closed at the toe. Do not wear sandals.  If you use a stepladder:  Make sure that it  is fully opened. Do not climb a closed stepladder.  Make sure that both sides of the stepladder are locked into place.  Ask someone to hold it for you, if possible.  Clearly mark and make sure that you can see:  Any grab bars or handrails.  First and last steps.  Where the edge of each step is.  Use tools that help you move around (mobility aids) if they are needed. These include:  Canes.  Walkers.  Scooters.  Crutches.  Turn on the lights when you go into a dark area. Replace any light bulbs as soon as they burn out.  Set up your furniture so you have a clear path. Avoid moving your furniture around.  If any of your floors are uneven, fix them.  If there are any pets around you, be aware of where they are.  Review your medicines with your doctor. Some medicines can make you feel dizzy. This can increase your chance of falling. Ask your doctor what other things that you can do to help prevent falls. This information is not intended to replace advice given to you by your health care provider. Make sure you discuss any questions you have with your health care provider. Document Released: 01/10/2009 Document Revised: 08/22/2015 Document Reviewed: 04/20/2014 Elsevier Interactive Patient Education  2017 [employer].

## 2020-06-26 DIAGNOSIS — H6063 Unspecified chronic otitis externa, bilateral: Secondary | ICD-10-CM | POA: Diagnosis not present

## 2020-06-26 DIAGNOSIS — H903 Sensorineural hearing loss, bilateral: Secondary | ICD-10-CM | POA: Diagnosis not present

## 2020-06-26 DIAGNOSIS — H6123 Impacted cerumen, bilateral: Secondary | ICD-10-CM | POA: Diagnosis not present

## 2020-07-03 ENCOUNTER — Other Ambulatory Visit: Payer: Self-pay

## 2020-07-03 ENCOUNTER — Ambulatory Visit (INDEPENDENT_AMBULATORY_CARE_PROVIDER_SITE_OTHER): Payer: Medicare Other

## 2020-07-03 DIAGNOSIS — Z5181 Encounter for therapeutic drug level monitoring: Secondary | ICD-10-CM

## 2020-07-03 DIAGNOSIS — I480 Paroxysmal atrial fibrillation: Secondary | ICD-10-CM

## 2020-07-03 LAB — POCT INR: INR: 3.6 — AB (ref 2.0–3.0)

## 2020-07-03 NOTE — Patient Instructions (Signed)
-   skip warfarin tonight, then  - START NEW warfarin dosage of 1 tablet every day EXCEPT 1/2 tablet on MONDAYS. - recheck in 2 weeks.

## 2020-07-04 ENCOUNTER — Other Ambulatory Visit: Payer: Self-pay | Admitting: Family Medicine

## 2020-07-17 ENCOUNTER — Ambulatory Visit (INDEPENDENT_AMBULATORY_CARE_PROVIDER_SITE_OTHER): Payer: Medicare Other

## 2020-07-17 ENCOUNTER — Other Ambulatory Visit: Payer: Self-pay

## 2020-07-17 DIAGNOSIS — I480 Paroxysmal atrial fibrillation: Secondary | ICD-10-CM

## 2020-07-17 DIAGNOSIS — Z5181 Encounter for therapeutic drug level monitoring: Secondary | ICD-10-CM

## 2020-07-17 LAB — POCT INR: INR: 1.7 — AB (ref 2.0–3.0)

## 2020-07-17 NOTE — Patient Instructions (Addendum)
-   take 1.5 tablets tonight, then  - continue warfarin dosage of 1 tablet every day EXCEPT 1.5 tablets on MONDAYS. - recheck in 2 weeks.

## 2020-07-23 ENCOUNTER — Ambulatory Visit (INDEPENDENT_AMBULATORY_CARE_PROVIDER_SITE_OTHER): Payer: Medicare Other

## 2020-07-23 DIAGNOSIS — I495 Sick sinus syndrome: Secondary | ICD-10-CM

## 2020-07-23 LAB — CUP PACEART REMOTE DEVICE CHECK
Battery Remaining Longevity: 70 mo
Battery Voltage: 3 V
Brady Statistic AP VP Percent: 0.96 %
Brady Statistic AP VS Percent: 93.49 %
Brady Statistic AS VP Percent: 2.63 %
Brady Statistic AS VS Percent: 2.92 %
Brady Statistic RA Percent Paced: 93.73 %
Brady Statistic RV Percent Paced: 3.66 %
Date Time Interrogation Session: 20220426091602
Implantable Lead Implant Date: 20070525
Implantable Lead Implant Date: 20070525
Implantable Lead Location: 753859
Implantable Lead Location: 753860
Implantable Lead Model: 5076
Implantable Lead Model: 5076
Implantable Pulse Generator Implant Date: 20171127
Lead Channel Impedance Value: 342 Ohm
Lead Channel Impedance Value: 361 Ohm
Lead Channel Impedance Value: 361 Ohm
Lead Channel Impedance Value: 399 Ohm
Lead Channel Pacing Threshold Amplitude: 0.75 V
Lead Channel Pacing Threshold Amplitude: 0.875 V
Lead Channel Pacing Threshold Pulse Width: 0.4 ms
Lead Channel Pacing Threshold Pulse Width: 0.4 ms
Lead Channel Sensing Intrinsic Amplitude: 1 mV
Lead Channel Sensing Intrinsic Amplitude: 1 mV
Lead Channel Sensing Intrinsic Amplitude: 1.5 mV
Lead Channel Sensing Intrinsic Amplitude: 1.5 mV
Lead Channel Setting Pacing Amplitude: 2 V
Lead Channel Setting Pacing Amplitude: 2.5 V
Lead Channel Setting Pacing Pulse Width: 0.4 ms
Lead Channel Setting Sensing Sensitivity: 0.6 mV

## 2020-07-31 ENCOUNTER — Ambulatory Visit (INDEPENDENT_AMBULATORY_CARE_PROVIDER_SITE_OTHER): Payer: Medicare Other

## 2020-07-31 ENCOUNTER — Other Ambulatory Visit: Payer: Self-pay

## 2020-07-31 DIAGNOSIS — I480 Paroxysmal atrial fibrillation: Secondary | ICD-10-CM

## 2020-07-31 DIAGNOSIS — Z5181 Encounter for therapeutic drug level monitoring: Secondary | ICD-10-CM

## 2020-07-31 LAB — POCT INR: INR: 1.7 — AB (ref 2.0–3.0)

## 2020-07-31 NOTE — Patient Instructions (Signed)
-   take 1.5 tablets tonight, then  - START NEW warfarin dosage of 1 tablet every day EXCEPT 1.5 tablets on MONDAYS and FRIDAYS. - recheck in 2 weeks.

## 2020-08-03 ENCOUNTER — Other Ambulatory Visit: Payer: Self-pay | Admitting: Family Medicine

## 2020-08-05 ENCOUNTER — Other Ambulatory Visit: Payer: Self-pay | Admitting: Internal Medicine

## 2020-08-05 NOTE — Telephone Encounter (Signed)
Refill request

## 2020-08-05 NOTE — Telephone Encounter (Signed)
This is a [name] [name] pt 

## 2020-08-08 ENCOUNTER — Ambulatory Visit (INDEPENDENT_AMBULATORY_CARE_PROVIDER_SITE_OTHER): Payer: Medicare Other

## 2020-08-08 ENCOUNTER — Other Ambulatory Visit: Payer: Self-pay

## 2020-08-08 DIAGNOSIS — E538 Deficiency of other specified B group vitamins: Secondary | ICD-10-CM | POA: Diagnosis not present

## 2020-08-08 MED ORDER — CYANOCOBALAMIN 1000 MCG/ML IJ SOLN
1000.0000 ug | Freq: Once | INTRAMUSCULAR | Status: AC
  Administered 2020-08-08: 1000 ug via INTRAMUSCULAR

## 2020-08-08 NOTE — Progress Notes (Signed)
Per orders of Dr. Diona Browner in the absence of Dr. Lorelei Pont, injection of B12, given by Aneta Mins, RN. Patient tolerated injection well in R Deltoid.

## 2020-08-13 NOTE — Progress Notes (Signed)
Remote pacemaker transmission.   

## 2020-08-14 ENCOUNTER — Ambulatory Visit (INDEPENDENT_AMBULATORY_CARE_PROVIDER_SITE_OTHER): Payer: Medicare Other

## 2020-08-14 ENCOUNTER — Other Ambulatory Visit: Payer: Self-pay

## 2020-08-14 DIAGNOSIS — I480 Paroxysmal atrial fibrillation: Secondary | ICD-10-CM

## 2020-08-14 DIAGNOSIS — Z5181 Encounter for therapeutic drug level monitoring: Secondary | ICD-10-CM | POA: Diagnosis not present

## 2020-08-14 LAB — POCT INR: INR: 2.6 (ref 2.0–3.0)

## 2020-08-14 NOTE — Patient Instructions (Signed)
-   continue warfarin dosage of 1 tablet every day EXCEPT 1.5 tablets on MONDAYS and FRIDAYS. - recheck in 3 weeks.

## 2020-09-04 ENCOUNTER — Other Ambulatory Visit: Payer: Self-pay

## 2020-09-04 ENCOUNTER — Ambulatory Visit (INDEPENDENT_AMBULATORY_CARE_PROVIDER_SITE_OTHER): Payer: Medicare Other

## 2020-09-04 DIAGNOSIS — Z5181 Encounter for therapeutic drug level monitoring: Secondary | ICD-10-CM

## 2020-09-04 DIAGNOSIS — I480 Paroxysmal atrial fibrillation: Secondary | ICD-10-CM | POA: Diagnosis not present

## 2020-09-04 LAB — POCT INR: INR: 3.2 — AB (ref 2.0–3.0)

## 2020-09-04 NOTE — Patient Instructions (Signed)
-   take 1/2 tablet warfarin tonight, then - continue warfarin dosage of 1 tablet every day EXCEPT 1.5 tablets on MONDAYS and FRIDAYS. - recheck in 3 weeks.

## 2020-09-05 DIAGNOSIS — H353131 Nonexudative age-related macular degeneration, bilateral, early dry stage: Secondary | ICD-10-CM | POA: Diagnosis not present

## 2020-09-05 DIAGNOSIS — H401132 Primary open-angle glaucoma, bilateral, moderate stage: Secondary | ICD-10-CM | POA: Diagnosis not present

## 2020-09-11 ENCOUNTER — Ambulatory Visit (INDEPENDENT_AMBULATORY_CARE_PROVIDER_SITE_OTHER): Payer: Medicare Other | Admitting: Family Medicine

## 2020-09-11 ENCOUNTER — Encounter: Payer: Self-pay | Admitting: Family Medicine

## 2020-09-11 ENCOUNTER — Other Ambulatory Visit: Payer: Self-pay

## 2020-09-11 VITALS — BP 110/60 | HR 66 | Temp 97.4°F | Ht 68.0 in | Wt 133.2 lb

## 2020-09-11 DIAGNOSIS — N184 Chronic kidney disease, stage 4 (severe): Secondary | ICD-10-CM | POA: Diagnosis not present

## 2020-09-11 DIAGNOSIS — E538 Deficiency of other specified B group vitamins: Secondary | ICD-10-CM | POA: Diagnosis not present

## 2020-09-11 DIAGNOSIS — Z79899 Other long term (current) drug therapy: Secondary | ICD-10-CM

## 2020-09-11 DIAGNOSIS — R300 Dysuria: Secondary | ICD-10-CM | POA: Diagnosis not present

## 2020-09-11 DIAGNOSIS — R600 Localized edema: Secondary | ICD-10-CM | POA: Diagnosis not present

## 2020-09-11 DIAGNOSIS — N3001 Acute cystitis with hematuria: Secondary | ICD-10-CM

## 2020-09-11 LAB — POC URINALSYSI DIPSTICK (AUTOMATED)
Bilirubin, UA: NEGATIVE
Glucose, UA: NEGATIVE
Ketones, UA: NEGATIVE
Nitrite, UA: NEGATIVE
Protein, UA: POSITIVE — AB
Spec Grav, UA: 1.015 (ref 1.010–1.025)
Urobilinogen, UA: 0.2 E.U./dL
pH, UA: 6 (ref 5.0–8.0)

## 2020-09-11 MED ORDER — CEPHALEXIN 500 MG PO CAPS: 500.0000 mg | ORAL_CAPSULE | Freq: Three times a day (TID) | ORAL | 0 refills | Status: DC

## 2020-09-11 MED ORDER — CYANOCOBALAMIN 1000 MCG/ML IJ SOLN
1000.0000 ug | Freq: Once | INTRAMUSCULAR | Status: AC
  Administered 2020-09-11: 1000 ug via INTRAMUSCULAR

## 2020-09-11 MED ORDER — CYANOCOBALAMIN 1000 MCG/ML IJ SOLN: 1000.0000 ug | Freq: Once | INTRAMUSCULAR | Status: DC

## 2020-09-11 NOTE — Patient Instructions (Signed)
Wait until the labs come back:  If everything looks ok:  Take 1 tablet of furosemide 20 mg every day for the next five days.  Then go back to every other day.  BUT - IF you have more swelling like today, it is ok to add an extra one on the day in between.  (Not 2 tablets in one day)

## 2020-09-11 NOTE — Progress Notes (Signed)
Evia Goldsmith T. Khushi Zupko, MD, [place] [facility] [place], [address]  Phone: [phone]  FAX: [place] y.o. male  MRN [id number]  Date of Birth: [birth year]  Date: 09/11/2020  PCP: Owens Loffler, MD  Referral: Owens Loffler, MD  Chief Complaint  Patient presents with   Leg Swelling    Left   Drainage from Incision    Left Side from Hernia Surgery that was done 2002-2003   B12 Injection   Burning with urination    This visit occurred during the SARS-CoV-2 public health emergency.  Safety protocols were in place, including screening questions prior to the visit, additional usage of staff PPE, and extensive cleaning of exam room while observing appropriate contact time as indicated for disinfecting solutions.   Subjective:   [name] [name] is a 87 y.o. very pleasant male patient with Body mass index is 20.26 kg/m. who presents with the following:  He and his daughter have a number of issues to discuss today.  Dysuria: Concern for potentially UTI with dysuria.  He has had these in the past.  L leg swelling: This is left greater than right.  He has had issues with this off and on for quite a while, intermittently for years. Cardiology and I have worked with the patient about gentle diuresis with furosemide.  He also has chronic kidney stage IV, so this has to be done gently without causing any worsening of his renal function.  Creatinine has been greater than 2 with a GFR greater than 20.  We have talked about this a lot with his family, and the patient wants to limit visits with additional specialist if in any way possible, so I have been following and managing.  Draining from Incision at hernia surgery, 20 years ago. - Dr. Hassell Done did cauterize it multiple time.  This is been intermittent for 20 years, but it has been doing well over the last recent years. At this point, it is soaking  through any bandage that he puts on there is some sometimes making his pants somewhat wet.  Check RFP and HFP.  RIght now Lasix 20 mg every other day.  Chronic B12 deficiency, injection today.  Review of Systems is noted in the HPI, as appropriate  Objective:   BP 110/60   Pulse 66   Temp (!) 97.4 F (36.3 C) (Temporal)   Ht 5\' 8"  (1.727 m)   Wt 133 lb 4 oz (60.4 kg)   SpO2 98%   BMI 20.26 kg/m   GEN: No acute distress; alert,appropriate. PULM: Breathing comfortably in no respiratory distress PSYCH: Normally interactive.  Abdomen is soft, nontender, nondistended with positive bowel sounds.  In the region of his prior left inguinal hernia this is very minimally having an area of some wetness. CV: RRR, no m/g/r  PULM: Normal respiratory rate, no accessory muscle use. No wheezes, crackles or rhonchi      Laboratory and Imaging Data: Results for orders placed or performed in visit on 01/13/50  Basic metabolic panel  Result Value Ref Range   Sodium 139 135 - 145 mEq/L   Potassium 3.8 3.5 - 5.1 mEq/L   Chloride 107 96 - 112 mEq/L   CO2 24 19 - 32 mEq/L   Glucose, Bld 82 70 - 99 mg/dL   BUN 48 (H) 6 - 23 mg/dL   Creatinine, Ser 2.31 (H) 0.40 - 1.50 mg/dL  GFR 23.43 (L) >60.00 mL/min   Calcium 9.5 8.4 - 10.5 mg/dL  Hepatic function panel  Result Value Ref Range   Total Bilirubin 0.7 0.2 - 1.2 mg/dL   Bilirubin, Direct 0.2 0.0 - 0.3 mg/dL   Alkaline Phosphatase 93 39 - 117 U/L   AST 21 0 - 37 U/L   ALT 14 0 - 53 U/L   Total Protein 7.3 6.0 - 8.3 g/dL   Albumin 3.7 3.5 - 5.2 g/dL  POCT Urinalysis Dipstick (Automated)  Result Value Ref Range   Color, UA Yellow    Clarity, UA Cloudy    Glucose, UA Negative Negative   Bilirubin, UA Negative    Ketones, UA Negative    Spec Grav, UA 1.015 1.010 - 1.025   Blood, UA Large    pH, UA 6.0 5.0 - 8.0   Protein, UA Positive (A) Negative   Urobilinogen, UA 0.2 0.2 or 1.0 E.U./dL   Nitrite, UA Negative    Leukocytes, UA  Large (3+) (A) Negative     Assessment and Plan:     ICD-10-CM   1. Lower extremity edema  I14.4 Basic metabolic panel    2. Vitamin B 12 deficiency  E53.8 cyanocobalamin ((VITAMIN B-12)) injection 1,000 mcg    DISCONTINUED: cyanocobalamin ((VITAMIN B-12)) injection 1,000 mcg    3. Burning with urination  R30.0 POCT Urinalysis Dipstick (Automated)    Urine Culture    4. Chronic kidney disease (CKD), stage IV (severe) ([name])  R15.4 Basic metabolic panel    5. Encounter for long-term (current) use of medications  Z79.899 Hepatic function panel    6. Acute cystitis with hematuria  N30.01      Cystitis with hematuria.  He is chronically on Coumadin, so I am going to place the patient on a cephalosporin.  Hopefully this will be all that he needs, but cultures are present.  Lower extremity swelling.  This got quite a bit worse when we stopped his furosemide last year.  Right now he is taking 20 mg every other day.  With discussion about the patient and his daughter, we are not going to do any additional work-up.  If his renal function seems to be not worsened, I think that doing some very gentle diuresis over the next few days is reasonable, then resuming his every other day furosemide.  Renal function has returned and is stable.  Initiate furosemide as below.  His albumin is improved, indicating improved protein calorie malnutrition.  Patient Instructions  Wait until the labs come back:  If everything looks ok:  Take 1 tablet of furosemide 20 mg every day for the next five days.  Then go back to every other day.  BUT - IF you have more swelling like today, it is ok to add an extra one on the day in between.  (Not 2 tablets in one day)   Meds ordered this encounter  Medications   DISCONTD: cyanocobalamin ((VITAMIN B-12)) injection 1,000 mcg   cyanocobalamin ((VITAMIN B-12)) injection 1,000 mcg   cephALEXin (KEFLEX) 500 MG capsule    Sig: Take 1 capsule (500 mg total) by mouth  3 (three) times daily.    Dispense:  21 capsule    Refill:  0   Medications Discontinued During This Encounter  Medication Reason   cyanocobalamin ((VITAMIN B-12)) injection 1,000 mcg    Orders Placed This Encounter  Procedures   Urine Culture   Basic metabolic panel   Hepatic function panel   POCT  Urinalysis Dipstick (Automated)    Follow-up: No follow-ups on file.  Signed,  [name]. Haelee Bolen, MD   Outpatient Encounter Medications as of 09/11/2020  Medication Sig   acetaminophen (TYLENOL) 500 MG tablet Take 500 mg by mouth every 6 (six) hours as needed for moderate pain or headache.   alendronate (FOSAMAX) 70 MG tablet TAKE 1 TABLET BY MOUTH EVERY 7 DAYS WITH A FULL GLASS OF WATER AND ON AN EMPTY STOMACH   amiodarone (PACERONE) 200 MG tablet TAKE 1/2 TABLET(100 MG) BY MOUTH DAILY   cephALEXin (KEFLEX) 500 MG capsule Take 1 capsule (500 mg total) by mouth 3 (three) times daily.   Cyanocobalamin (VITAMIN B-12 IJ) Inject 1,000 mcg as directed every 30 (thirty) days.   docusate sodium (COLACE) 50 MG capsule Take 50 mg by mouth 2 (two) times daily as needed for mild constipation.   dorzolamide-timolol (COSOPT) 22.3-6.8 MG/ML ophthalmic solution Place 1 drop into both eyes 2 (two) times daily.   ferrous sulfate 325 (65 FE) MG EC tablet Take 325 mg by mouth daily with breakfast.   furosemide (LASIX) 20 MG tablet Take 20 mg by mouth every other day.   levothyroxine (SYNTHROID) 100 MCG tablet TAKE 1 TABLET BY MOUTH DAILY BEFORE BREAKFAST   loperamide (IMODIUM) 2 MG capsule Take 2 mg by mouth as needed for diarrhea or loose stools.   Multiple Vitamin (MULTIVITAMIN) tablet Take 1 tablet by mouth daily.   pantoprazole (PROTONIX) 40 MG tablet TAKE 1 TABLET(40 MG) BY MOUTH DAILY   polyethylene glycol (MIRALAX / GLYCOLAX) packet Take 17 g by mouth daily as needed for moderate constipation.    pravastatin (PRAVACHOL) 40 MG tablet TAKE 1/2 TABLET(20 MG) BY MOUTH AT BEDTIME   simethicone  (MYLICON) 749 MG chewable tablet Chew 125 mg by mouth every 6 (six) hours as needed for flatulence.   tamsulosin (FLOMAX) 0.4 MG CAPS capsule TAKE 1 CAPSULE BY MOUTH DAILY   warfarin (COUMADIN) 1 MG tablet TAKE 1 TO 1 AND 1/2 TABLETS BY MOUTH DAILY AS DIRECTED BY COUMADIN CLINIC   Wheat Dextrin (BENEFIBER DRINK MIX PO) Take 1 scoop by mouth daily.   [EXPIRED] cyanocobalamin ((VITAMIN B-12)) injection 1,000 mcg    [DISCONTINUED] cyanocobalamin ((VITAMIN B-12)) injection 1,000 mcg    No facility-administered encounter medications on file as of 09/11/2020.

## 2020-09-12 LAB — BASIC METABOLIC PANEL
BUN: 48 mg/dL — ABNORMAL HIGH (ref 6–23)
CO2: 24 mEq/L (ref 19–32)
Calcium: 9.5 mg/dL (ref 8.4–10.5)
Chloride: 107 mEq/L (ref 96–112)
Creatinine, Ser: 2.31 mg/dL — ABNORMAL HIGH (ref 0.40–1.50)
GFR: 23.43 mL/min — ABNORMAL LOW (ref >60.00)
Glucose, Bld: 82 mg/dL (ref 70–99)
Potassium: 3.8 mEq/L (ref 3.5–5.1)
Sodium: 139 mEq/L (ref 135–145)

## 2020-09-12 LAB — HEPATIC FUNCTION PANEL
ALT: 14 U/L (ref 0–53)
AST: 21 U/L (ref 0–37)
Albumin: 3.7 g/dL (ref 3.5–5.2)
Alkaline Phosphatase: 93 U/L (ref 39–117)
Bilirubin, Direct: 0.2 mg/dL (ref 0.0–0.3)
Total Bilirubin: 0.7 mg/dL (ref 0.2–1.2)
Total Protein: 7.3 g/dL (ref 6.0–8.3)

## 2020-09-13 ENCOUNTER — Encounter: Payer: Self-pay | Admitting: Family Medicine

## 2020-09-15 ENCOUNTER — Other Ambulatory Visit: Payer: Self-pay | Admitting: Family Medicine

## 2020-09-15 LAB — URINE CULTURE
MICRO NUMBER:: 12011012
SPECIMEN QUALITY:: ADEQUATE

## 2020-09-16 MED ORDER — SULFAMETHOXAZOLE-TRIMETHOPRIM 400-80 MG PO TABS: 1.0000 | ORAL_TABLET | Freq: Two times a day (BID) | ORAL | 0 refills | Status: DC

## 2020-09-16 NOTE — Addendum Note (Signed)
Addended by: Owens Loffler on: 09/16/2020 08:58 AM   Modules accepted: Orders

## 2020-09-17 ENCOUNTER — Other Ambulatory Visit: Payer: Self-pay | Admitting: Family Medicine

## 2020-09-18 ENCOUNTER — Ambulatory Visit (INDEPENDENT_AMBULATORY_CARE_PROVIDER_SITE_OTHER): Payer: Medicare Other

## 2020-09-18 ENCOUNTER — Other Ambulatory Visit: Payer: Self-pay

## 2020-09-18 DIAGNOSIS — Z5181 Encounter for therapeutic drug level monitoring: Secondary | ICD-10-CM | POA: Diagnosis not present

## 2020-09-18 DIAGNOSIS — I480 Paroxysmal atrial fibrillation: Secondary | ICD-10-CM | POA: Diagnosis not present

## 2020-09-18 LAB — POCT INR: INR: 5.3 — AB (ref 2.0–3.0)

## 2020-09-18 NOTE — Patient Instructions (Signed)
-   have a serving of greens today - skip warfarin for the next 3 days, then  - take 1/2 tablet on Saturday and Sunday - on Monday resume warfarin dosage of 1 tablet every day EXCEPT 1.5 tablets on MONDAYS and FRIDAYS. - recheck as scheduled next week

## 2020-09-25 ENCOUNTER — Ambulatory Visit (INDEPENDENT_AMBULATORY_CARE_PROVIDER_SITE_OTHER): Payer: Medicare Other | Admitting: Pharmacist

## 2020-09-25 ENCOUNTER — Other Ambulatory Visit: Payer: Self-pay

## 2020-09-25 DIAGNOSIS — Z7901 Long term (current) use of anticoagulants: Secondary | ICD-10-CM | POA: Diagnosis not present

## 2020-09-25 DIAGNOSIS — I480 Paroxysmal atrial fibrillation: Secondary | ICD-10-CM

## 2020-09-25 LAB — POCT INR: INR: 3.7 — AB (ref 2.0–3.0)

## 2020-09-25 NOTE — Patient Instructions (Signed)
Description   - Skip dose of warfarin today - have a serving of greens today - Change schedule to 1mg  every day - recheck as scheduled next week

## 2020-09-26 ENCOUNTER — Encounter: Payer: Self-pay | Admitting: Physician Assistant

## 2020-09-26 ENCOUNTER — Ambulatory Visit: Payer: Medicare Other | Admitting: Physician Assistant

## 2020-09-26 VITALS — BP 98/62 | HR 81 | Ht 68.0 in | Wt 126.0 lb

## 2020-09-26 DIAGNOSIS — R300 Dysuria: Secondary | ICD-10-CM

## 2020-09-26 LAB — BLADDER SCAN AMB NON-IMAGING

## 2020-09-26 NOTE — Progress Notes (Signed)
09/26/2020 4:02 PM   [name] [name] 1922/05/15 [id number]  CC: Chief Complaint  Patient presents with   Dysuria   HPI: [name] [name] is a 87 y.o. male with PMH prostate cancer in 2002 s/p radiation therapy, hernia mesh erosion within the bladder, and bulbar urethral stricture who presents today for evaluation of persistent dysuria.  He is accompanied today by his daughter, [name] [name], who contributes to HPI.  He saw his PCP 15 days ago with reports of dysuria.  Urine culture ultimately grew ampicillin and ceftriaxone resistant Raoultella planticola.  He was originally started on empiric Keflex, but switched to Bactrim DS twice daily x7 days when his dysuria persisted.  Today he reports having completed 7 days of Bactrim several days ago.  Despite this, he continues to have dysuria.  He states the dysuria originally started 4 to 5 weeks ago.  Notably, he reports worsening split urinary stream over this time.  In-office UA today positive for 1+ blood, 1+ protein, and 2+ leukocyte esterase; urine microscopy with 11-30 WBCs/HPF and 3-10 RBCs/HPF. PVR 61mL.  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, left eye   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    GERD (gastroesophageal reflux disease)    GI hemorrhage    Hyperlipidemia    Hypertension    Lower extremity edema    Neck arthritis, Severe, multi-level 06/09/2013   Paroxysmal atrial fibrillation ([name])    s/p failed ablation   Peptic ulcer, unspecified site,  unspecified as acute or chronic, without mention of hemorrhage, perforation, or obstruction 1975   Personal history of malignant neoplasm of prostate 12/2000   5 weeks radiation, radiation seeds   Radiation proctitis    Sinoatrial node dysfunction ([place])    Unspecified glaucoma(365.9)    Unspecified hypothyroidism    Vitamin B12 deficiency     Surgical History: Past Surgical History:  Procedure Laterality Date   [place] AND ABLATION  2001, 2003   failure   CATARACT EXTRACTION     CYSTOGRAM N/A 09/21/2018   Procedure: CYSTOGRAM;  Surgeon: Abbie Sons, MD;  Location: [facility];  Service: Urology;  Laterality: N/A;   CYSTOSCOPY WITH URETHRAL DILATATION N/A 01/29/2017   Procedure: CYSTOSCOPY WITH URETHRAL DILATATION;  Surgeon: Abbie Sons, MD;  Location: [facility];  Service: Urology;  Laterality: N/A;   CYSTOSCOPY WITH URETHRAL DILATATION N/A 09/21/2018   Procedure: CYSTOSCOPY WITH URETHRAL DILATATION;  Surgeon: Abbie Sons, MD;  Location: [facility];  Service: Urology;  Laterality: N/A;   EP IMPLANTABLE DEVICE N/A 02/24/2016   Procedure: PPM Generator Changeout;  Surgeon: Deboraha Sprang, MD;  Location: [facility];  Service: Cardiovascular;  Laterality: N/A;   gastric ulcer surgery     HERNIA REPAIR  1994, 2001, 2003   s/p drainage and complications, 03/3242, 0/1027 mesh removal   HIP ARTHROPLASTY Right 02/16/2019   Procedure: RIGHT ANTERIOR HIP HEMIARTHROPLASTY,CEMENTED;  Surgeon: Hessie Knows, MD;  Location: [facility];  Service: Orthopedics;  Laterality: Right;   INSERT / REPLACE / REMOVE PACEMAKER  07/2005   symptomatic bradycardia   kidney stones     NOSE SURGERY     cancer removed    PROSTATE SURGERY     SKIN CANCER DESTRUCTION     TOOTH EXTRACTION      Home Medications:  Allergies as of 09/26/2020       Reactions   Penicillins Rash, Other (See Comments)   Has patient had a PCN reaction causing immediate  rash, facial/tongue/throat swelling, SOB or lightheadedness with hypotension: {no Has patient had a PCN reaction causing severe rash involving mucus membranes or skin necrosis: {no Has patient had a PCN reaction that required hospitalization {no Has patient had a PCN reaction occurring within the last 10 years: {no If all of the above answers are "NO", then may proceed with Cephalosporin use.        Medication List        Accurate as of September 26, 2020  4:02 PM. If you have any questions, ask your nurse or doctor.          STOP taking these medications    cephALEXin 500 MG capsule Commonly known as: KEFLEX Stopped by: Debroah Loop, PA-C   sulfamethoxazole-trimethoprim 400-80 MG tablet Commonly known as: BACTRIM Stopped by: Debroah Loop, PA-C       TAKE these medications    acetaminophen 500 MG tablet Commonly known as: TYLENOL Take 500 mg by mouth every 6 (six) hours as needed for moderate pain or headache.   alendronate 70 MG tablet Commonly known as: FOSAMAX TAKE 1 TABLET BY MOUTH EVERY 7 DAYS WITH A FULL GLASS OF WATER AND ON AN EMPTY STOMACH   amiodarone 200 MG tablet Commonly known as: PACERONE TAKE 1/2 TABLET(100 MG) BY MOUTH DAILY   BENEFIBER DRINK MIX PO Take 1 scoop by mouth daily.   docusate sodium 50 MG capsule Commonly known as: COLACE Take 50 mg by mouth 2 (two) times daily as needed for mild constipation.   dorzolamide-timolol 22.3-6.8 MG/ML ophthalmic solution Commonly known as: COSOPT Place 1 drop into both eyes 2 (two) times daily.   ferrous sulfate 325 (65 FE) MG EC tablet Take 325 mg by mouth daily with breakfast.   furosemide 20 MG tablet Commonly known as: LASIX Take 1 tablet (20 mg total) by mouth every other day.   levothyroxine 100 MCG tablet Commonly known as: SYNTHROID TAKE 1 TABLET BY MOUTH DAILY BEFORE BREAKFAST   loperamide 2 MG capsule Commonly known as: IMODIUM Take 2 mg by mouth as needed for  diarrhea or loose stools.   multivitamin tablet Take 1 tablet by mouth daily.   pantoprazole 40 MG tablet Commonly known as: PROTONIX TAKE 1 TABLET(40 MG) BY MOUTH DAILY   polyethylene glycol 17 g packet Commonly known as: MIRALAX / GLYCOLAX Take 17 g by mouth daily as needed for moderate constipation.   pravastatin 40 MG tablet Commonly known as: PRAVACHOL TAKE 1/2 TABLET(20 MG) BY MOUTH AT BEDTIME   simethicone 125 MG chewable tablet Commonly known as: MYLICON Chew 132 mg by mouth every 6 (six) hours as needed for flatulence.   tamsulosin 0.4 MG Caps capsule Commonly known as: FLOMAX TAKE 1 CAPSULE BY MOUTH DAILY   VITAMIN B-12 IJ Inject 1,000 mcg as directed every 30 (thirty) days.   warfarin 1 MG tablet Commonly known as: COUMADIN Take as directed by the anticoagulation clinic. If you are unsure  how to take this medication, talk to your nurse or doctor. Original instructions: TAKE 1 TO 1 AND 1/2 TABLETS BY MOUTH DAILY AS DIRECTED BY COUMADIN CLINIC        Allergies:  Allergies  Allergen Reactions   Penicillins Rash and Other (See Comments)    Has patient had a PCN reaction causing immediate rash, facial/tongue/throat swelling, SOB or lightheadedness with hypotension: {no Has patient had a PCN reaction causing severe rash involving mucus membranes or skin necrosis: {no Has patient had a PCN reaction that required hospitalization {no Has patient had a PCN reaction occurring within the last 10 years: {no If all of the above answers are "NO", then may proceed with Cephalosporin use.    Family History: Family History  Problem Relation Age of Onset   Breast cancer Mother    Bone cancer Father    Alcohol abuse Other    Coronary artery disease Other    Dementia Other    Diabetes Other     Social History:   reports that he has never smoked. He has never used smokeless tobacco. He reports that he does not drink alcohol and does not use drugs.  Physical  Exam: BP 98/62   Pulse 81   Ht 5\' 8"  (1.727 m)   Wt 126 lb (57.2 kg)   BMI 19.16 kg/m   Constitutional:  Alert and oriented, no acute distress, nontoxic appearing HEENT: [place], AT Cardiovascular: No clubbing, cyanosis, or edema Respiratory: Normal respiratory effort, no increased work of breathing Skin: No rashes, bruises or suspicious lesions Neurologic: Grossly intact, no focal deficits, moving all 4 extremities Psychiatric: Normal mood and affect  Laboratory Data: Results for orders placed or performed in visit on 09/26/20  CULTURE, URINE COMPREHENSIVE   Specimen: Urine   UR  Result Value Ref Range   Urine Culture, Comprehensive Final report    Organism ID, Bacteria Comment   Microscopic Examination   Urine  Result Value Ref Range   WBC, UA 11-30 (A) 0 - 5 /hpf   RBC 3-10 (A) 0 - 2 /hpf   Epithelial Cells (non renal) 0-10 0 - 10 /hpf   Casts Present (A) None seen /lpf   Cast Type Hyaline casts N/A   Bacteria, UA None seen None seen/Few  Urinalysis, Complete  Result Value Ref Range   Specific Gravity, UA 1.020 1.005 - 1.030   pH, UA 5.5 5.0 - 7.5   Color, UA Yellow Yellow   Appearance Ur Cloudy (A) Clear   Leukocytes,UA 2+ (A) Negative   Protein,UA 1+ (A) Negative/Trace   Glucose, UA Negative Negative   Ketones, UA Negative Negative   RBC, UA 1+ (A) Negative   Bilirubin, UA Negative Negative   Urobilinogen, Ur 0.2 0.2 - 1.0 mg/dL   Nitrite, UA Negative Negative   Microscopic Examination See below:   Bladder Scan (Post Void Residual) in office  Result Value Ref Range   Scan Result 27mL    Assessment & Plan:   1. Dysuria Differential includes recurrent versus persistent UTI versus urethral stricture recurrence.  We will send urine for culture today and treat based on results and plan for cystoscopy with Dr. Bernardo Heater in about 2 weeks.  Patient is in agreement with this plan. - Urinalysis, Complete - Bladder Scan (Post Void Residual) in office - CULTURE, URINE  COMPREHENSIVE  Return in about 2 weeks (around 10/10/2020) for Cystoscopy with Dr. Bernardo Heater.  Debroah Loop, PA-C  [address], [address]  [place], [place] [address] [phone]

## 2020-09-27 LAB — URINALYSIS, COMPLETE
Bilirubin, UA: NEGATIVE
Glucose, UA: NEGATIVE
Ketones, UA: NEGATIVE
Nitrite, UA: NEGATIVE
Specific Gravity, UA: 1.02 (ref 1.005–1.030)
Urobilinogen, Ur: 0.2 mg/dL (ref 0.2–1.0)
pH, UA: 5.5 (ref 5.0–7.5)

## 2020-09-27 LAB — MICROSCOPIC EXAMINATION: Bacteria, UA: NONE SEEN

## 2020-09-29 LAB — CULTURE, URINE COMPREHENSIVE

## 2020-10-01 ENCOUNTER — Telehealth: Payer: Self-pay

## 2020-10-01 NOTE — Telephone Encounter (Signed)
LMOM notifying patient as advised.  

## 2020-10-01 NOTE — Telephone Encounter (Signed)
-----   Message from Debroah Loop, [place] sent at 10/01/2020  9:32 AM EDT ----- Ucx negative for infection, please keep upcoming cysto appt. ----- Message ----- From: Interface, [facility] Lab Results In Sent: 09/27/2020   5:37 AM EDT To: Debroah Loop, PA-C

## 2020-10-02 ENCOUNTER — Ambulatory Visit (INDEPENDENT_AMBULATORY_CARE_PROVIDER_SITE_OTHER): Payer: Medicare Other | Admitting: Pharmacist

## 2020-10-02 ENCOUNTER — Other Ambulatory Visit: Payer: Self-pay

## 2020-10-02 DIAGNOSIS — I480 Paroxysmal atrial fibrillation: Secondary | ICD-10-CM | POA: Diagnosis not present

## 2020-10-02 DIAGNOSIS — I4891 Unspecified atrial fibrillation: Secondary | ICD-10-CM

## 2020-10-02 DIAGNOSIS — Z7901 Long term (current) use of anticoagulants: Secondary | ICD-10-CM | POA: Diagnosis not present

## 2020-10-02 LAB — POCT INR: INR: 2.7 (ref 2.0–3.0)

## 2020-10-02 NOTE — Patient Instructions (Signed)
Description   - Continue 1mg  every day - Keep your greens consistent

## 2020-10-16 ENCOUNTER — Other Ambulatory Visit: Payer: Self-pay

## 2020-10-16 ENCOUNTER — Encounter: Payer: Self-pay | Admitting: Family Medicine

## 2020-10-16 ENCOUNTER — Ambulatory Visit (INDEPENDENT_AMBULATORY_CARE_PROVIDER_SITE_OTHER): Payer: Medicare Other | Admitting: Family Medicine

## 2020-10-16 VITALS — BP 120/72 | HR 81 | Temp 97.4°F | Ht 68.0 in | Wt 123.2 lb

## 2020-10-16 DIAGNOSIS — Z0000 Encounter for general adult medical examination without abnormal findings: Secondary | ICD-10-CM | POA: Diagnosis not present

## 2020-10-16 DIAGNOSIS — N184 Chronic kidney disease, stage 4 (severe): Secondary | ICD-10-CM | POA: Diagnosis not present

## 2020-10-16 DIAGNOSIS — E538 Deficiency of other specified B group vitamins: Secondary | ICD-10-CM

## 2020-10-16 MED ORDER — CYANOCOBALAMIN 1000 MCG/ML IJ SOLN
1000.0000 ug | Freq: Once | INTRAMUSCULAR | Status: AC
  Administered 2020-10-16: 1000 ug via INTRAMUSCULAR

## 2020-10-16 MED ORDER — FUROSEMIDE 20 MG PO TABS: 20.0000 mg | ORAL_TABLET | Freq: Every day | ORAL | 1 refills | Status: DC

## 2020-10-16 NOTE — Patient Instructions (Addendum)
Stick with Furosemide 20 mg each morning at 9 AM.  Check with your insurance to see if they will cover the shingles shot.  The newer [facility] shot is much better than the older shot. The [facility] shot requires 2 shots given 6 months apart.  Almost always, this shot is not covered in our office by your insurance. It costs 500 dollars, but essentially all insurances cover it at your pharmacy.  I would call your insurance number on your card to confirm this.

## 2020-10-16 NOTE — Progress Notes (Signed)
[name] Mustapha T. Angla Delahunt, MD, [place] at [facility] [place], [address]  Phone: [phone]  FAX: [place] y.o. male  MRN [id number]  Date of Birth: [birth year]  Date: 10/16/2020  PCP: Owens Loffler, MD  Referral: Owens Loffler, MD  Chief Complaint  Patient presents with   Annual Exam    This visit occurred during the SARS-CoV-2 public health emergency.  Safety protocols were in place, including screening questions prior to the visit, additional usage of staff PPE, and extensive cleaning of exam room while observing appropriate contact time as indicated for disinfecting solutions.   Patient Care Team: Owens Loffler, MD as PCP - General (Family Medicine) Deboraha Sprang, MD as PCP - Cardiology (Cardiology) Subjective:   [name] [name] is a 87 y.o. pleasant patient who presents with the following:  Preventative Health Maintenance Visit:  Health Maintenance Summary Reviewed and updated, unless pt declines services. Globally has been doing reasonably well for a 87 year old.  He has lost weight, and his daughter has been encouraging him to eat what ever he wants.  He also does drink an Ensure once a day as well.  He is become much less active, he does not drive and rarely leaves the home.  He is using a walker now.  He also has significant cardiac disease, the same time he also has chronic kidney disease stage IV.  There has been some balance with trying to keep his swelling in check with furosemide and at the same time watching his renal function.  We have talked about it previously, and he would not prefer to see a nephrologist or additional physicians if at all possible.  Tobacco History Reviewed. Alcohol: No concerns, no excessive use Exercise Habits: Minimal to none Drug Use: None  Shingles Covid #4  LE edema is looking a lot better. Now with lasix 20 mg Wakes up at 9  AM Eats around- 1 PM.  B12 shot.  Today.  Health Maintenance  Topic Date Due   Zoster Vaccines- Shingrix (1 of 2) Never done   COVID-19 Vaccine (4 - Booster for Pfizer series) 05/10/2020   INFLUENZA VACCINE  10/28/2020   TETANUS/TDAP  04/07/2027   PNA vac Low Risk Adult  Completed   HPV VACCINES  Aged Out   Immunization History  Administered Date(s) Administered   Fluad Quad(high Dose 65+) 01/04/2019, 12/07/2019   H1N1 05/09/2008   Influenza Split 02/10/2011   Influenza Whole 01/15/2009, 01/13/2010   Influenza, High Dose Seasonal PF 12/21/2012, 02/23/2017   Influenza,inj,Quad PF,6+ Mos 01/08/2014, 01/25/2015, 01/06/2018   PFIZER(Purple Top)SARS-COV-2 Vaccination 04/19/2019, 05/13/2019, 02/08/2020   Pneumococcal Conjugate-13 12/14/2013   Pneumococcal Polysaccharide-23 03/31/2003, 03/30/2005   Td 03/31/2003, 04/06/2017   Zoster, Live 02/06/2008   Patient Active Problem List   Diagnosis Date Noted   Longstanding persistent atrial fibrillation ([place])     Priority: Medium   PACEMAKER, PERMANENT 08/07/2008    Priority: Medium   Atrial fibrillation ([place]) 02/06/2008    Priority: Medium   Age-related osteoporosis with current pathological fracture with routine healing 02/21/2019   Chronic kidney disease (CKD), stage IV (severe) ([place]) 02/21/2019   Glaucoma    Long term (current) use of anticoagulants 06/24/2010   GLAUCOMA 08/07/2008   GERD 08/07/2008   Vitamin B 12 deficiency 07/19/2008   RADIATION PROCTITIS 07/18/2008   DIVERTICULOSIS, COLON 07/18/2008   Prostate cancer, in remission 07/18/2008   Essential hypertension 05/09/2008  Hyperlipidemia 02/06/2008   Hypothyroidism 12/01/2007    Past Medical History:  Diagnosis Date   AICD (automatic cardioverter/defibrillator) present    PACEMAKER   Anemia    Cancer ([place])    Cardiac pacemaker in situ 07/2005   symptomatic bradycardia   CKD (chronic kidney disease) stage 3, GFR 30-59 ml/min ([name]) 04/13/2017   Closed fracture  of neck of right femur ([place])    Closed right hip fracture, initial encounter ([place]) 02/14/2019   Diverticulosis of colon (without mention of hemorrhage)    Dysrhythmia    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, left eye   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    GERD (gastroesophageal reflux disease)    GI hemorrhage    Hyperlipidemia    Hypertension    Lower extremity edema    Neck arthritis, Severe, multi-level 06/09/2013   Paroxysmal atrial fibrillation ([name])    s/p failed ablation   Peptic ulcer, unspecified site, unspecified as acute or chronic, without mention of hemorrhage, perforation, or obstruction 1975   Personal history of malignant neoplasm of prostate 12/2000   5 weeks radiation, radiation seeds   Radiation proctitis    Sinoatrial node dysfunction ([place])    Unspecified glaucoma(365.9)    Unspecified hypothyroidism    Vitamin B12 deficiency     Past Surgical History:  Procedure Laterality Date   [place] AND ABLATION  2001, 2003   failure   CATARACT EXTRACTION     CYSTOGRAM N/A 09/21/2018   Procedure: CYSTOGRAM;  Surgeon: Abbie Sons, MD;  Location: [facility];  Service: Urology;  Laterality: N/A;   CYSTOSCOPY WITH URETHRAL DILATATION N/A 01/29/2017   Procedure: CYSTOSCOPY WITH URETHRAL DILATATION;  Surgeon: Abbie Sons, MD;  Location: [facility];  Service: Urology;  Laterality: N/A;   CYSTOSCOPY WITH URETHRAL DILATATION N/A 09/21/2018   Procedure: CYSTOSCOPY WITH URETHRAL DILATATION;  Surgeon: Abbie Sons, MD;  Location: [facility];  Service: Urology;  Laterality: N/A;   EP IMPLANTABLE DEVICE N/A 02/24/2016   Procedure: PPM Generator Changeout;  Surgeon: Deboraha Sprang, MD;  Location: [facility];  Service: Cardiovascular;  Laterality: N/A;   gastric ulcer surgery     HERNIA REPAIR  1994, 2001, 2003   s/p drainage and complications, 05/4740, 07/9561 mesh removal   HIP ARTHROPLASTY Right  02/16/2019   Procedure: RIGHT ANTERIOR HIP HEMIARTHROPLASTY,CEMENTED;  Surgeon: Hessie Knows, MD;  Location: [facility];  Service: Orthopedics;  Laterality: Right;   INSERT / REPLACE / REMOVE PACEMAKER  07/2005   symptomatic bradycardia   kidney stones     NOSE SURGERY     cancer removed    PROSTATE SURGERY     SKIN CANCER DESTRUCTION     TOOTH EXTRACTION      Family History  Problem Relation Age of Onset   Breast cancer Mother    Bone cancer Father    Alcohol abuse Other    Coronary artery disease Other    Dementia Other    Diabetes Other     Past Medical History, Surgical History, Social History, Family History, Problem List, Medications, and Allergies have been reviewed and updated if relevant.  Review of Systems: Pertinent positives are listed above.  Otherwise, a full 14 point review of systems has been done in full and it is negative except where it is noted positive.  Objective:   BP 120/72   Pulse 81   Temp (!) 97.4 F (  36.3 C) (Temporal)   Ht 5\' 8"  (1.727 m)   Wt 123 lb 4 oz (55.9 kg)   SpO2 98%   BMI 18.74 kg/m  Ideal Body Weight: Weight in (lb) to have BMI = 25: 164.1  Ideal Body Weight: Weight in (lb) to have BMI = 25: 164.1 No results found. Depression screen [facility] 2/9 06/25/2020 04/17/2020 04/12/2019 03/16/2019 04/08/2018  Decreased Interest 0 0 0 0 0  Down, Depressed, Hopeless 0 0 0 0 0  PHQ - 2 Score 0 0 0 0 0  Altered sleeping 0 - 0 - 0  Tired, decreased energy 0 - 0 - 0  Change in appetite 0 - 0 - 0  Feeling bad or failure about yourself  0 - 0 - 0  Trouble concentrating 0 - 0 - 0  Moving slowly or fidgety/restless 0 - 0 - 0  Suicidal thoughts 0 - 0 - 0  PHQ-9 Score 0 - 0 - 0  Difficult doing work/chores Not difficult at all - Not difficult at all - Not difficult at all  Some recent data might be hidden     GEN: well developed, well nourished, no acute distress Eyes: conjunctiva and lids normal, PERRLA, EOMI ENT: TM clear, nares clear, oral exam  WNL Neck: supple, no lymphadenopathy, no thyromegaly, no JVD Pulm: clear to auscultation and percussion, respiratory effort normal CV: regular rate and rhythm, S1-S2, no murmur, rub or gallop, no bruits, peripheral pulses normal and symmetric, no cyanosis, clubbing, edema or varicosities GI: soft, non-tender; no hepatosplenomegaly, masses; active bowel sounds all quadrants GU: deferred Lymph: no cervical, axillary or inguinal adenopathy MSK: gait normal, muscle tone and strength WNL, no joint swelling, effusions, discoloration, crepitus  SKIN: clear, good turgor, color WNL, no rashes, lesions, or ulcerations Neuro: normal mental status, normal strength, sensation, and motion Psych: alert; oriented to person, place and time, normally interactive and not anxious or depressed in appearance.  All labs reviewed with patient. Results for orders placed or performed in visit on 53/29/92  Basic metabolic panel  Result Value Ref Range   Sodium 138 135 - 145 mEq/L   Potassium 4.3 3.5 - 5.1 mEq/L   Chloride 105 96 - 112 mEq/L   CO2 27 19 - 32 mEq/L   Glucose, Bld 92 70 - 99 mg/dL   BUN 61 (H) 6 - 23 mg/dL   Creatinine, Ser 2.71 (H) 0.40 - 1.50 mg/dL   GFR 19.33 (L) >60.00 mL/min   Calcium 9.1 8.4 - 10.5 mg/dL    Assessment and Plan:     ICD-10-CM   1. Healthcare maintenance  Z00.00     2. CKD (chronic kidney disease) stage 4, GFR 15-29 ml/min ([name])  E26.8 Basic metabolic panel    3. Vitamin B 12 deficiency  E53.8 cyanocobalamin ((VITAMIN B-12)) injection 1,000 mcg     Encouraged him to get COVID #4 and shingles.  Vaccinations.  Globally, he is doing well at age 70.  His mind is doing well, and his body is becoming more frail.  I encouraged him to eat anything that he wants.  At the time of this dictation the patient's creatinine is, up to 2.7 compared with a baseline of 2.3.  I am going to have to revisit this with his daughter.  This slight increase may have been due to to changing his  furosemide dosing from every other day to daily dosing due to some worsened lower extremity edema.  Health Maintenance Exam: The patient's preventative  maintenance and recommended screening tests for an annual wellness exam were reviewed in full today. Brought up to date unless services declined.  Counselled on the importance of diet, exercise, and its role in overall health and mortality. The patient's FH and SH was reviewed, including their home life, tobacco status, and drug and alcohol status.  Follow-up in 1 year for physical exam or additional follow-up below.  Follow-up: Return in about 6 months (around 04/18/2021). Or follow-up in 1 year if not noted.  Meds ordered this encounter  Medications   furosemide (LASIX) 20 MG tablet    Sig: Take 1 tablet (20 mg total) by mouth daily.    Dispense:  90 tablet    Refill:  1   cyanocobalamin ((VITAMIN B-12)) injection 1,000 mcg   Medications Discontinued During This Encounter  Medication Reason   furosemide (LASIX) 20 MG tablet Duplicate   furosemide (LASIX) 20 MG tablet    Orders Placed This Encounter  Procedures   Basic metabolic panel    Signed,  [facility] T. Durant Scibilia, MD   Allergies as of 10/16/2020       Reactions   Penicillins Rash, Other (See Comments)   Has patient had a PCN reaction causing immediate rash, facial/tongue/throat swelling, SOB or lightheadedness with hypotension: {no Has patient had a PCN reaction causing severe rash involving mucus membranes or skin necrosis: {no Has patient had a PCN reaction that required hospitalization {no Has patient had a PCN reaction occurring within the last 10 years: {no If all of the above answers are "NO", then may proceed with Cephalosporin use.        Medication List        Accurate as of October 16, 2020 11:59 PM. If you have any questions, ask your nurse or doctor.          acetaminophen 500 MG tablet Commonly known as: TYLENOL Take 500 mg by mouth every 6 (six)  hours as needed for moderate pain or headache.   alendronate 70 MG tablet Commonly known as: FOSAMAX TAKE 1 TABLET BY MOUTH EVERY 7 DAYS WITH A FULL GLASS OF WATER AND ON AN EMPTY STOMACH   amiodarone 200 MG tablet Commonly known as: PACERONE TAKE 1/2 TABLET(100 MG) BY MOUTH DAILY   BENEFIBER DRINK MIX PO Take 1 scoop by mouth daily.   docusate sodium 50 MG capsule Commonly known as: COLACE Take 50 mg by mouth 2 (two) times daily as needed for mild constipation.   dorzolamide-timolol 22.3-6.8 MG/ML ophthalmic solution Commonly known as: COSOPT Place 1 drop into both eyes 2 (two) times daily.   ferrous sulfate 325 (65 FE) MG EC tablet Take 325 mg by mouth daily with breakfast.   furosemide 20 MG tablet Commonly known as: LASIX Take 1 tablet (20 mg total) by mouth daily. What changed:  when to take this Another medication with the same name was removed. Continue taking this medication, and follow the directions you see here. Changed by: Owens Loffler, MD   levothyroxine 100 MCG tablet Commonly known as: SYNTHROID TAKE 1 TABLET BY MOUTH DAILY BEFORE BREAKFAST   loperamide 2 MG capsule Commonly known as: IMODIUM Take 2 mg by mouth as needed for diarrhea or loose stools.   multivitamin tablet Take 1 tablet by mouth daily.   pantoprazole 40 MG tablet Commonly known as: PROTONIX TAKE 1 TABLET(40 MG) BY MOUTH DAILY   polyethylene glycol 17 g packet Commonly known as: MIRALAX / GLYCOLAX Take 17 g by mouth daily as  needed for moderate constipation.   pravastatin 40 MG tablet Commonly known as: PRAVACHOL TAKE 1/2 TABLET(20 MG) BY MOUTH AT BEDTIME   simethicone 125 MG chewable tablet Commonly known as: MYLICON Chew 948 mg by mouth every 6 (six) hours as needed for flatulence.   tamsulosin 0.4 MG Caps capsule Commonly known as: FLOMAX TAKE 1 CAPSULE BY MOUTH DAILY   VITAMIN B-12 IJ Inject 1,000 mcg as directed every 30 (thirty) days.   warfarin 1 MG  tablet Commonly known as: COUMADIN Take as directed by the anticoagulation clinic. If you are unsure how to take this medication, talk to your nurse or doctor. Original instructions: TAKE 1 TO 1 AND 1/2 TABLETS BY MOUTH DAILY AS DIRECTED BY COUMADIN CLINIC

## 2020-10-17 ENCOUNTER — Encounter: Payer: Self-pay | Admitting: Urology

## 2020-10-17 ENCOUNTER — Encounter: Payer: Self-pay | Admitting: Family Medicine

## 2020-10-17 ENCOUNTER — Ambulatory Visit: Payer: Medicare Other | Admitting: Urology

## 2020-10-17 VITALS — BP 105/67 | HR 80 | Ht 68.0 in | Wt 123.0 lb

## 2020-10-17 DIAGNOSIS — N35912 Unspecified bulbous urethral stricture, male: Secondary | ICD-10-CM

## 2020-10-17 DIAGNOSIS — N35812 Other urethral bulbous stricture, male: Secondary | ICD-10-CM

## 2020-10-17 DIAGNOSIS — R300 Dysuria: Secondary | ICD-10-CM

## 2020-10-17 LAB — BASIC METABOLIC PANEL
BUN: 61 mg/dL — ABNORMAL HIGH (ref 6–23)
CO2: 27 mEq/L (ref 19–32)
Calcium: 9.1 mg/dL (ref 8.4–10.5)
Chloride: 105 mEq/L (ref 96–112)
Creatinine, Ser: 2.71 mg/dL — ABNORMAL HIGH (ref 0.40–1.50)
GFR: 19.33 mL/min — ABNORMAL LOW (ref >60.00)
Glucose, Bld: 92 mg/dL (ref 70–99)
Potassium: 4.3 mEq/L (ref 3.5–5.1)
Sodium: 138 mEq/L (ref 135–145)

## 2020-10-18 ENCOUNTER — Other Ambulatory Visit: Payer: Self-pay | Admitting: Urology

## 2020-10-18 ENCOUNTER — Telehealth: Payer: Self-pay

## 2020-10-18 DIAGNOSIS — I700 Atherosclerosis of aorta: Secondary | ICD-10-CM

## 2020-10-18 DIAGNOSIS — Z87448 Personal history of other diseases of urinary system: Secondary | ICD-10-CM

## 2020-10-18 LAB — URINALYSIS, COMPLETE
Bilirubin, UA: NEGATIVE
Glucose, UA: NEGATIVE
Ketones, UA: NEGATIVE
Nitrite, UA: NEGATIVE
Specific Gravity, UA: 1.01 (ref 1.005–1.030)
Urobilinogen, Ur: 0.2 mg/dL (ref 0.2–1.0)
pH, UA: 5.5 (ref 5.0–7.5)

## 2020-10-18 LAB — MICROSCOPIC EXAMINATION: WBC, UA: >30 /hpf — AB (ref 0–5)

## 2020-10-18 NOTE — Telephone Encounter (Signed)
   [facility] Pre-operative Risk Assessment    Patient Name: [name] [name]  DOB: 06-Feb-1923 MRN: [id number]  HEARTCARE STAFF:  - IMPORTANT!!!!!! Under Visit Info/Reason for Call, type in Other and utilize the format Clearance MM/DD/YY or Clearance TBD. Do not use dashes or single digits. - Please review there is not already an duplicate clearance open for this procedure. - If request is for dental extraction, please clarify the # of teeth to be extracted. - If the patient is currently at the dentist's office, call Pre-Op Callback Staff (MA/nurse) to input urgent request.  - If the patient is not currently in the dentist office, please route to the Pre-Op pool.  Request for surgical clearance:  What type of surgery is being performed? Cystoscopy, Balloon dilation of stricture   When is this surgery scheduled? 11/05/2020  What type of clearance is required (medical clearance vs. Pharmacy clearance to hold med vs. Both)? both  Are there any medications that need to be held prior to surgery and how long? Coumadin 5 days prior to surgery   Practice name and name of physician performing surgery? Dr John Giovanni at [facility]   What is the office phone number? [phone] or [phone] Option 3    7.   What is the office fax number? [phone]  8.   Anesthesia type (None, local, MAC, general) ? Claiborne Rigg T 10/18/2020, 8:53 AM  _________________________________________________________________   (provider comments below)

## 2020-10-19 ENCOUNTER — Encounter: Payer: Self-pay | Admitting: Urology

## 2020-10-19 NOTE — Progress Notes (Signed)
   10/19/20  CC:  Chief Complaint  Patient presents with   Cysto    HPI: Refer to Sam Vaillancourt's note 09/26/2020.  Denies gross hematuria  Blood pressure 105/67, pulse 80, height 5\' 8"  (1.727 m), weight 123 lb (55.8 kg). NED. A&Ox3.   No respiratory distress   Abd soft, NT, ND Normal phallus with bilateral descended testicles  Cystoscopy Procedure Note  Patient identification was confirmed, informed consent was obtained, and patient was prepped using Betadine solution.  Lidocaine jelly was administered per urethral meatus.     Pre-Procedure: - Inspection reveals a normal caliber urethral meatus.  Procedure: The flexible cystoscope was introduced without difficulty -< 10 French bulbar stricture.  Unable to advance flexible cystoscope any further  Post-Procedure: - Patient tolerated the procedure well  Assessment/ Plan: Recurrent bulbar urethral stricture Discussed with patient and daughter the above findings.  Prior dilations have resulted in recurrent stricture and he has been unable to perform intermittent catheterization Recommend trial of balloon dilation with [facility] The procedure was discussed and they desire to schedule   Abbie Sons, MD

## 2020-10-22 ENCOUNTER — Ambulatory Visit (INDEPENDENT_AMBULATORY_CARE_PROVIDER_SITE_OTHER): Payer: Medicare Other

## 2020-10-22 DIAGNOSIS — I700 Atherosclerosis of aorta: Secondary | ICD-10-CM

## 2020-10-22 LAB — CUP PACEART REMOTE DEVICE CHECK
Battery Remaining Longevity: 61 mo
Battery Voltage: 3 V
Brady Statistic AP VP Percent: 5.22 %
Brady Statistic AP VS Percent: 89.34 %
Brady Statistic AS VP Percent: 2.34 %
Brady Statistic AS VS Percent: 3.09 %
Brady Statistic RA Percent Paced: 93.76 %
Brady Statistic RV Percent Paced: 7.73 %
Date Time Interrogation Session: 20220726093035
Implantable Lead Implant Date: 20070525
Implantable Lead Implant Date: 20070525
Implantable Lead Location: 753859
Implantable Lead Location: 753860
Implantable Lead Model: 5076
Implantable Lead Model: 5076
Implantable Pulse Generator Implant Date: 20171127
Lead Channel Impedance Value: 380 Ohm
Lead Channel Impedance Value: 399 Ohm
Lead Channel Impedance Value: 399 Ohm
Lead Channel Impedance Value: 418 Ohm
Lead Channel Pacing Threshold Amplitude: 0.875 V
Lead Channel Pacing Threshold Amplitude: 0.875 V
Lead Channel Pacing Threshold Pulse Width: 0.4 ms
Lead Channel Pacing Threshold Pulse Width: 0.4 ms
Lead Channel Sensing Intrinsic Amplitude: 0.625 mV
Lead Channel Sensing Intrinsic Amplitude: 0.625 mV
Lead Channel Sensing Intrinsic Amplitude: 2.25 mV
Lead Channel Sensing Intrinsic Amplitude: 2.25 mV
Lead Channel Setting Pacing Amplitude: 2 V
Lead Channel Setting Pacing Amplitude: 2.5 V
Lead Channel Setting Pacing Pulse Width: 0.4 ms
Lead Channel Setting Sensing Sensitivity: 0.6 mV

## 2020-10-22 NOTE — Telephone Encounter (Signed)
Patient with diagnosis of afib on warfarin for anticoagulation.    Procedure: Cystoscopy, Balloon dilation of stricture Date of procedure: 11/05/20   CHA2DS2-VASc Score = 4  This indicates a 4.8% annual risk of stroke. The patient's score is based upon: CHF History: No HTN History: Yes Diabetes History: No Stroke History: No Vascular Disease History: Yes Age Score: 2 Gender Score: 0    CrCl 12.9 mL/min Platelet count 177K  Per office protocol, patient can hold warfarin for 5 days prior to procedure.   Patient will NOT need bridging with Lovenox (enoxaparin) around procedure.  Patient should restart warfarin on the evening of procedure or day after, at discretion of procedure MD.

## 2020-10-22 NOTE — Telephone Encounter (Signed)
Left message to call back at 11:05 AM on 10/22/2020.  Patient needs call back.

## 2020-10-23 ENCOUNTER — Ambulatory Visit (INDEPENDENT_AMBULATORY_CARE_PROVIDER_SITE_OTHER): Payer: Medicare Other

## 2020-10-23 ENCOUNTER — Other Ambulatory Visit: Payer: Self-pay

## 2020-10-23 DIAGNOSIS — Z7901 Long term (current) use of anticoagulants: Secondary | ICD-10-CM

## 2020-10-23 DIAGNOSIS — I480 Paroxysmal atrial fibrillation: Secondary | ICD-10-CM

## 2020-10-23 LAB — CULTURE, URINE COMPREHENSIVE

## 2020-10-23 LAB — POCT INR: INR: 2.8 (ref 2.0–3.0)

## 2020-10-23 NOTE — Patient Instructions (Signed)
-   continue warfarin dosage of 1 mg tablet every day - STARTING ON Thursday, 8/4 HOLD WARFARIN FOR 5 DAYS - IF OK'D BY PERFORMING MD, RESUME WARFARIN THE NIGHT OF YOUR PROCEDURE W/ EXTRA 1/2 TABLET FOR 2 DAYS. - Recheck INR in 4 weeks

## 2020-10-24 ENCOUNTER — Other Ambulatory Visit: Payer: Medicare Other

## 2020-10-24 DIAGNOSIS — R300 Dysuria: Secondary | ICD-10-CM | POA: Diagnosis not present

## 2020-10-24 NOTE — Telephone Encounter (Signed)
Attempted to contact patient [phone] on 10/24/2020.  Patient needs call back.

## 2020-10-24 NOTE — Telephone Encounter (Signed)
Pt's daughter [name] is returning call to Coletta Memos for Pre Op

## 2020-10-24 NOTE — Progress Notes (Signed)
I think this is ok.  Hopefully, it will be better after dilation.

## 2020-10-25 ENCOUNTER — Other Ambulatory Visit: Payer: Self-pay

## 2020-10-25 ENCOUNTER — Telehealth: Payer: Self-pay | Admitting: Internal Medicine

## 2020-10-25 ENCOUNTER — Telehealth: Payer: Self-pay

## 2020-10-25 ENCOUNTER — Encounter
Admission: RE | Admit: 2020-10-25 | Discharge: 2020-10-25 | Disposition: A | Discharge status: Home / Self Care | Payer: Medicare Other | Source: Ambulatory Visit | Attending: Urology | Admitting: Urology

## 2020-10-25 HISTORY — DX: Unspecified glaucoma: H40.9

## 2020-10-25 HISTORY — DX: Other abnormalities of gait and mobility: R26.89

## 2020-10-25 HISTORY — DX: Presence of cardiac pacemaker: Z95.0

## 2020-10-25 HISTORY — DX: Atherosclerosis of aorta: I70.0

## 2020-10-25 HISTORY — DX: Hypoglycemia, unspecified: E16.2

## 2020-10-25 LAB — URINALYSIS, COMPLETE
Bilirubin, UA: NEGATIVE
Glucose, UA: NEGATIVE
Ketones, UA: NEGATIVE
Nitrite, UA: POSITIVE — AB
Protein,UA: NEGATIVE
Specific Gravity, UA: 1.01 (ref 1.005–1.030)
Urobilinogen, Ur: 0.2 mg/dL (ref 0.2–1.0)
pH, UA: 5.5 (ref 5.0–7.5)

## 2020-10-25 LAB — MICROSCOPIC EXAMINATION
RBC, Urine: >30 /hpf — AB (ref 0–2)
WBC, UA: >30 /hpf — AB (ref 0–5)

## 2020-10-25 NOTE — Telephone Encounter (Signed)
Left detailed message for [facility] notifying her that we did receive a clearance note from Card[facility] stating that it is ok for [name] [name] to stop coudmadin 5 days prior to surgery on 11-05-20. Return call to office to confirm

## 2020-10-25 NOTE — Patient Instructions (Signed)
Your procedure is scheduled on:11-05-20 Tuesday Report to the Registration Desk on the 1st floor of the [facility]-Then proceed to the 2nd floor Surgery Desk in the [facility] To find out your arrival time, please call [phone] between 1PM - 3PM on:11-04-20 Monday  REMEMBER: Instructions that are not followed completely may result in serious medical risk, up to and including death; or upon the discretion of your surgeon and anesthesiologist your surgery may need to be rescheduled.  Do not eat food OR drink any liquids after midnight the night before surgery. (If your blood sugar drops the morning of surgery, you may drink apple juice up to 2 hours prior to your arrival time to the hospital) No gum chewing, lozengers or hard candies.  TAKE THESE MEDICATIONS THE MORNING OF SURGERY WITH A SIP OF WATER: -Levothyroxine (Synthroid) -Tamsulosin (Flomax) -Pantoprazole (Protonix)-take one the night before and one on the morning of surgery - helps to prevent nausea after surgery.)  Stop your Coumadin (Warfarin) as instructed 5 days prior to surgery-Last dose will be on 10-30-20 (Wednesday)  One week prior to surgery: Stop Anti-inflammatories (NSAIDS) such as Advil, Aleve, Ibuprofen, Motrin, Naproxen, Naprosyn and Aspirin based products such as Excedrin, Goodys Powder, BC Powder.You may however, continue to take Tylenol if needed for pain up until the day of surgery.  Stop ANY OVER THE COUNTER supplements/vitamins 7 days prior to surgery (Multivitamin)  No Alcohol for 24 hours before or after surgery.  No Smoking including e-cigarettes for 24 hours prior to surgery.  No chewable tobacco products for at least 6 hours prior to surgery.  No nicotine patches on the day of surgery.  Do not use any "recreational" drugs for at least a week prior to your surgery.  Please be advised that the combination of cocaine and anesthesia may have negative outcomes, up to and including death. If you test  positive for cocaine, your surgery will be cancelled.  On the morning of surgery brush your teeth with toothpaste and water, you may rinse your mouth with mouthwash if you wish. Do not swallow any toothpaste or mouthwash.  Do not wear jewelry, make-up, hairpins, clips or nail polish.  Do not wear lotions, powders, or perfumes.   Do not shave body from the neck down 48 hours prior to surgery just in case you cut yourself which could leave a site for infection.  Also, freshly shaved skin may become irritated if using the CHG soap.  Contact lenses, hearing aids and dentures may not be worn into surgery.  Do not bring valuables to the hospital. [facility] is not responsible for any missing/lost belongings or valuables.   Notify your doctor if there is any change in your medical condition (cold, fever, infection).  Wear comfortable clothing (specific to your surgery type) to the hospital.  After surgery, you can help prevent lung complications by doing breathing exercises.  Take deep breaths and cough every 1-2 hours. Your doctor may order a device called an Incentive Spirometer to help you take deep breaths. When coughing or sneezing, hold a pillow firmly against your incision with both hands. This is called "splinting." Doing this helps protect your incision. It also decreases belly discomfort.  If you are being admitted to the hospital overnight, leave your suitcase in the car. After surgery it may be brought to your room.  If you are being discharged the day of surgery, you will not be allowed to drive home. You will need a responsible adult (18  years or older) to drive you home and stay with you that night.   If you are taking public transportation, you will need to have a responsible adult (18 years or older) with you. Please confirm with your physician that it is acceptable to use public transportation.   Please call the [employer] [facility] at [phone] if you have  any questions about these instructions.  Surgery Visitation Policy:  Patients undergoing a surgery or procedure may have one family member or support person with them as long as that person is not COVID-19 positive or experiencing its symptoms.  That person may remain in the waiting area during the procedure.  Inpatient Visitation:    Visiting hours are 7 a.m. to 8 p.m. Inpatients will be allowed two visitors daily. The visitors may change each day during the patient's stay. No visitors under the age of 44. Any visitor under the age of 1 must be accompanied by an adult. The visitor must pass COVID-19 screenings, use hand sanitizer when entering and exiting the patient's room and wear a mask at all times, including in the patient's room. Patients must also wear a mask when staff or their visitor are in the room. Masking is required regardless of vaccination status.

## 2020-10-25 NOTE — Telephone Encounter (Signed)
Spoke with patient's daughter regarding need for preoperative cardiac evaluation.  Daughter not with patient at this time.  Tentatively planned follow-up call 4:10 AM.  Patient will need call back.

## 2020-10-25 NOTE — Telephone Encounter (Signed)
Patient's daughter was calling back to get info on the clearance. Please made sure you call daughter back and not patient. Please advise

## 2020-10-25 NOTE — Telephone Encounter (Signed)
Follow up:   Patient returning a call back.  

## 2020-10-25 NOTE — Telephone Encounter (Signed)
   Primary Cardiologist: Virl Axe, MD  Chart reviewed as part of pre-operative protocol coverage. Given past medical history and time since last visit, based on ACC/AHA guidelines, [name] [name] would be at acceptable risk for the planned procedure without further cardiovascular testing.   Patient with diagnosis of afib on warfarin for anticoagulation.     Procedure: Cystoscopy, Balloon dilation of stricture Date of procedure: 11/05/20     CHA2DS2-VASc Score = 4  This indicates a 4.8% annual risk of stroke. The patient's score is based upon: CHF History: No HTN History: Yes Diabetes History: No Stroke History: No Vascular Disease History: Yes Age Score: 2 Gender Score: 0      CrCl 12.9 mL/min Platelet count 177K   Per office protocol, patient can hold warfarin for 5 days prior to procedure.   Patient will NOT need bridging with Lovenox (enoxaparin) around procedure.   Patient should restart warfarin on the evening of procedure or day after, at discretion of procedure MD.   Patient was advised that if he develops new symptoms prior to surgery to contact our office to arrange a follow-up appointment.  He verbalized understanding.  He did report that he has rare occasional low blood sugar.  I will route this recommendation to the requesting party via Epic fax function and remove from pre-op pool.  Please call with questions.  Jossie Ng. Amisha Pospisil NP-C    10/25/2020, 10:13 AM [address] Office [phone] Fax [phone]

## 2020-10-28 ENCOUNTER — Encounter: Payer: Self-pay | Admitting: Internal Medicine

## 2020-10-28 ENCOUNTER — Encounter: Payer: Self-pay | Admitting: Urgent Care

## 2020-10-28 ENCOUNTER — Encounter
Admission: RE | Admit: 2020-10-28 | Discharge: 2020-10-28 | Disposition: A | Discharge status: Home / Self Care | Payer: Medicare Other | Source: Ambulatory Visit | Attending: Urology | Admitting: Urology

## 2020-10-28 DIAGNOSIS — Z01812 Encounter for preprocedural laboratory examination: Secondary | ICD-10-CM | POA: Diagnosis not present

## 2020-10-28 LAB — CBC
HCT: 35.4 % — ABNORMAL LOW (ref 39.0–52.0)
Hemoglobin: 11.1 g/dL — ABNORMAL LOW (ref 13.0–17.0)
MCH: 32.3 pg (ref 26.0–34.0)
MCHC: 31.4 g/dL (ref 30.0–36.0)
MCV: 102.9 fL — ABNORMAL HIGH (ref 80.0–100.0)
Platelets: 216 10*3/uL (ref 150–400)
RBC: 3.44 MIL/uL — ABNORMAL LOW (ref 4.22–5.81)
RDW: 13.4 % (ref 11.5–15.5)
WBC: 6.3 10*3/uL (ref 4.0–10.5)
nRBC: 0 % (ref 0.0–0.2)

## 2020-10-28 LAB — CULTURE, URINE COMPREHENSIVE

## 2020-10-28 NOTE — Telephone Encounter (Signed)
Called and confirmed with [name] [name] that message was received. Patient will stop coumadin 5 days prior to surgery

## 2020-10-28 NOTE — Progress Notes (Signed)
[facility] DEVICE PROGRAMMING  Patient Information: Name:  KAYA KLAUSING  DOB:  [birth year]  MRN:  [id number]    Planned Procedure:  CYSTOSCOPY WITH URETHRAL DILATATION-OPTILUME BALLOON  Surgeon:  Dr. John Giovanni, MD  Date of Procedure:  11/05/2020  Cautery will be used.   Please send documentation back to:  [facility] [phone] # [phone])  Device Information:  Clinic EP Physician:  Virl Axe, MD   Device Type:  Pacemaker Manufacturer and Phone #:  [employer]: [id number] Pacemaker Dependent?:  Yes.   Date of Last Device Check:  10/22/20 (Remote) Normal Device Function?:  Yes.    Electrophysiologist's Recommendations:  Have magnet available. Provide continuous ECG monitoring when magnet is used or reprogramming is to be performed.  Procedure may interfere with device function.  Magnet should be placed over device during procedure.  Per Device Clinic Standing Orders, Simone Curia, RN  3:28 PM 10/28/2020

## 2020-10-29 ENCOUNTER — Encounter: Payer: Self-pay | Admitting: Urology

## 2020-10-30 ENCOUNTER — Telehealth: Payer: Self-pay | Admitting: *Deleted

## 2020-10-30 ENCOUNTER — Other Ambulatory Visit: Payer: Self-pay | Admitting: Urology

## 2020-10-30 MED ORDER — SULFAMETHOXAZOLE-TRIMETHOPRIM 800-160 MG PO TABS: 1.0000 | ORAL_TABLET | Freq: Two times a day (BID) | ORAL | 0 refills | Status: AC

## 2020-10-30 NOTE — Telephone Encounter (Signed)
Notified patient as instructed, patient pleased. Discussed follow-up appointments, patient agrees  

## 2020-10-30 NOTE — Telephone Encounter (Signed)
-----   Message from Abbie Sons, MD sent at 10/30/2020  7:18 AM EDT ----- Urine culture was positive.  Antibiotic Rx was sent to pharmacy.  Have him go ahead and start

## 2020-11-05 ENCOUNTER — Emergency Department: Payer: Medicare Other

## 2020-11-05 ENCOUNTER — Encounter
Admission: RE | Disposition: A | Discharge status: Other Acute Inpatient Hospital | Payer: Self-pay | Source: Home / Self Care | Attending: Urology

## 2020-11-05 ENCOUNTER — Ambulatory Visit: Payer: Medicare Other

## 2020-11-05 ENCOUNTER — Other Ambulatory Visit: Payer: Self-pay

## 2020-11-05 ENCOUNTER — Ambulatory Visit
Admission: RE | Admit: 2020-11-05 | Discharge: 2020-11-05 | Disposition: A | Discharge status: Other Acute Inpatient Hospital | Payer: Medicare Other | Attending: Urology | Admitting: Urology

## 2020-11-05 ENCOUNTER — Emergency Department
Admission: EM | Admit: 2020-11-05 | Discharge: 2020-11-05 | Disposition: A | Discharge status: Home / Self Care | Payer: Medicare Other | Source: Home / Self Care | Attending: Emergency Medicine | Admitting: Emergency Medicine

## 2020-11-05 DIAGNOSIS — Z20822 Contact with and (suspected) exposure to covid-19: Secondary | ICD-10-CM | POA: Diagnosis not present

## 2020-11-05 DIAGNOSIS — S2242XA Multiple fractures of ribs, left side, initial encounter for closed fracture: Secondary | ICD-10-CM

## 2020-11-05 DIAGNOSIS — I4891 Unspecified atrial fibrillation: Secondary | ICD-10-CM | POA: Insufficient documentation

## 2020-11-05 DIAGNOSIS — R109 Unspecified abdominal pain: Secondary | ICD-10-CM | POA: Insufficient documentation

## 2020-11-05 DIAGNOSIS — Z9181 History of falling: Secondary | ICD-10-CM | POA: Insufficient documentation

## 2020-11-05 DIAGNOSIS — W19XXXA Unspecified fall, initial encounter: Secondary | ICD-10-CM | POA: Insufficient documentation

## 2020-11-05 DIAGNOSIS — S0993XA Unspecified injury of face, initial encounter: Secondary | ICD-10-CM | POA: Diagnosis not present

## 2020-11-05 DIAGNOSIS — J90 Pleural effusion, not elsewhere classified: Secondary | ICD-10-CM | POA: Insufficient documentation

## 2020-11-05 DIAGNOSIS — Z880 Allergy status to penicillin: Secondary | ICD-10-CM | POA: Diagnosis not present

## 2020-11-05 DIAGNOSIS — Z79899 Other long term (current) drug therapy: Secondary | ICD-10-CM | POA: Insufficient documentation

## 2020-11-05 DIAGNOSIS — Z8249 Family history of ischemic heart disease and other diseases of the circulatory system: Secondary | ICD-10-CM | POA: Insufficient documentation

## 2020-11-05 DIAGNOSIS — Y939 Activity, unspecified: Secondary | ICD-10-CM | POA: Insufficient documentation

## 2020-11-05 DIAGNOSIS — Z7901 Long term (current) use of anticoagulants: Secondary | ICD-10-CM | POA: Insufficient documentation

## 2020-11-05 DIAGNOSIS — I129 Hypertensive chronic kidney disease with stage 1 through stage 4 chronic kidney disease, or unspecified chronic kidney disease: Secondary | ICD-10-CM | POA: Insufficient documentation

## 2020-11-05 DIAGNOSIS — Z87448 Personal history of other diseases of urinary system: Secondary | ICD-10-CM

## 2020-11-05 DIAGNOSIS — R791 Abnormal coagulation profile: Secondary | ICD-10-CM

## 2020-11-05 DIAGNOSIS — Z950 Presence of cardiac pacemaker: Secondary | ICD-10-CM | POA: Insufficient documentation

## 2020-11-05 DIAGNOSIS — S270XXA Traumatic pneumothorax, initial encounter: Secondary | ICD-10-CM

## 2020-11-05 DIAGNOSIS — Z833 Family history of diabetes mellitus: Secondary | ICD-10-CM | POA: Diagnosis not present

## 2020-11-05 DIAGNOSIS — K219 Gastro-esophageal reflux disease without esophagitis: Secondary | ICD-10-CM | POA: Insufficient documentation

## 2020-11-05 DIAGNOSIS — Z79890 Hormone replacement therapy (postmenopausal): Secondary | ICD-10-CM | POA: Diagnosis not present

## 2020-11-05 DIAGNOSIS — N183 Chronic kidney disease, stage 3 unspecified: Secondary | ICD-10-CM | POA: Diagnosis not present

## 2020-11-05 DIAGNOSIS — I2510 Atherosclerotic heart disease of native coronary artery without angina pectoris: Secondary | ICD-10-CM | POA: Diagnosis not present

## 2020-11-05 DIAGNOSIS — D638 Anemia in other chronic diseases classified elsewhere: Secondary | ICD-10-CM | POA: Diagnosis not present

## 2020-11-05 DIAGNOSIS — Z5309 Procedure and treatment not carried out because of other contraindication: Secondary | ICD-10-CM | POA: Insufficient documentation

## 2020-11-05 DIAGNOSIS — S098XXA Other specified injuries of head, initial encounter: Secondary | ICD-10-CM | POA: Diagnosis not present

## 2020-11-05 DIAGNOSIS — N184 Chronic kidney disease, stage 4 (severe): Secondary | ICD-10-CM | POA: Insufficient documentation

## 2020-11-05 DIAGNOSIS — Z8546 Personal history of malignant neoplasm of prostate: Secondary | ICD-10-CM | POA: Insufficient documentation

## 2020-11-05 DIAGNOSIS — R296 Repeated falls: Secondary | ICD-10-CM

## 2020-11-05 DIAGNOSIS — Z803 Family history of malignant neoplasm of breast: Secondary | ICD-10-CM | POA: Diagnosis not present

## 2020-11-05 DIAGNOSIS — S301XXA Contusion of abdominal wall, initial encounter: Secondary | ICD-10-CM | POA: Insufficient documentation

## 2020-11-05 DIAGNOSIS — E039 Hypothyroidism, unspecified: Secondary | ICD-10-CM | POA: Insufficient documentation

## 2020-11-05 DIAGNOSIS — Z96641 Presence of right artificial hip joint: Secondary | ICD-10-CM | POA: Insufficient documentation

## 2020-11-05 DIAGNOSIS — Z85828 Personal history of other malignant neoplasm of skin: Secondary | ICD-10-CM | POA: Insufficient documentation

## 2020-11-05 DIAGNOSIS — E785 Hyperlipidemia, unspecified: Secondary | ICD-10-CM | POA: Insufficient documentation

## 2020-11-05 DIAGNOSIS — S0083XA Contusion of other part of head, initial encounter: Secondary | ICD-10-CM | POA: Insufficient documentation

## 2020-11-05 DIAGNOSIS — W01198A Fall on same level from slipping, tripping and stumbling with subsequent striking against other object, initial encounter: Secondary | ICD-10-CM | POA: Insufficient documentation

## 2020-11-05 DIAGNOSIS — I10 Essential (primary) hypertension: Secondary | ICD-10-CM | POA: Diagnosis not present

## 2020-11-05 DIAGNOSIS — N281 Cyst of kidney, acquired: Secondary | ICD-10-CM | POA: Diagnosis not present

## 2020-11-05 DIAGNOSIS — Z043 Encounter for examination and observation following other accident: Secondary | ICD-10-CM | POA: Diagnosis not present

## 2020-11-05 DIAGNOSIS — K5730 Diverticulosis of large intestine without perforation or abscess without bleeding: Secondary | ICD-10-CM | POA: Diagnosis not present

## 2020-11-05 HISTORY — DX: Unspecified malignant neoplasm of skin, unspecified: C44.90

## 2020-11-05 LAB — COMPREHENSIVE METABOLIC PANEL
ALT: 16 U/L (ref 0–44)
AST: 22 U/L (ref 15–41)
Albumin: 3.3 g/dL — ABNORMAL LOW (ref 3.5–5.0)
Alkaline Phosphatase: 77 U/L (ref 38–126)
Anion gap: 8 (ref 5–15)
BUN: 79 mg/dL — ABNORMAL HIGH (ref 8–23)
CO2: 19 mmol/L — ABNORMAL LOW (ref 22–32)
Calcium: 8.7 mg/dL — ABNORMAL LOW (ref 8.9–10.3)
Chloride: 106 mmol/L (ref 98–111)
Creatinine, Ser: 3.59 mg/dL — ABNORMAL HIGH (ref 0.61–1.24)
GFR, Estimated: 15 mL/min — ABNORMAL LOW (ref >60)
Glucose, Bld: 102 mg/dL — ABNORMAL HIGH (ref 70–99)
Potassium: 3.9 mmol/L (ref 3.5–5.1)
Sodium: 133 mmol/L — ABNORMAL LOW (ref 135–145)
Total Bilirubin: 0.6 mg/dL (ref 0.3–1.2)
Total Protein: 6.7 g/dL (ref 6.5–8.1)

## 2020-11-05 LAB — CBC WITH DIFFERENTIAL/PLATELET
Abs Immature Granulocytes: 0.02 10*3/uL (ref 0.00–0.07)
Basophils Absolute: 0 10*3/uL (ref 0.0–0.1)
Basophils Relative: 0 %
Eosinophils Absolute: 0 10*3/uL (ref 0.0–0.5)
Eosinophils Relative: 1 %
HCT: 31.8 % — ABNORMAL LOW (ref 39.0–52.0)
Hemoglobin: 10.3 g/dL — ABNORMAL LOW (ref 13.0–17.0)
Immature Granulocytes: 0 %
Lymphocytes Relative: 17 %
Lymphs Abs: 1.1 10*3/uL (ref 0.7–4.0)
MCH: 32 pg (ref 26.0–34.0)
MCHC: 32.4 g/dL (ref 30.0–36.0)
MCV: 98.8 fL (ref 80.0–100.0)
Monocytes Absolute: 0.4 10*3/uL (ref 0.1–1.0)
Monocytes Relative: 7 %
Neutro Abs: 4.9 10*3/uL (ref 1.7–7.7)
Neutrophils Relative %: 75 %
Platelets: 193 10*3/uL (ref 150–400)
RBC: 3.22 MIL/uL — ABNORMAL LOW (ref 4.22–5.81)
RDW: 13.6 % (ref 11.5–15.5)
WBC: 6.5 10*3/uL (ref 4.0–10.5)
nRBC: 0 % (ref 0.0–0.2)

## 2020-11-05 LAB — TYPE AND SCREEN
ABO/RH(D): O POS
Antibody Screen: NEGATIVE

## 2020-11-05 LAB — RESP PANEL BY RT-PCR (FLU A&B, COVID) ARPGX2
Influenza A by PCR: NEGATIVE
Influenza B by PCR: NEGATIVE
SARS Coronavirus 2 by RT PCR: NEGATIVE

## 2020-11-05 LAB — PROTIME-INR
INR: 6.3 (ref 0.8–1.2)
INR: 6.6 (ref 0.8–1.2)
Prothrombin Time: 55.6 seconds — ABNORMAL HIGH (ref 11.4–15.2)
Prothrombin Time: 57.9 seconds — ABNORMAL HIGH (ref 11.4–15.2)

## 2020-11-05 SURGERY — CYSTOSCOPY, WITH URETHRAL DILATION
Anesthesia: Choice

## 2020-11-05 MED ORDER — LACTATED RINGERS IV SOLN: INTRAVENOUS | Status: DC

## 2020-11-05 MED ORDER — ORAL CARE MOUTH RINSE: 15.0000 mL | Freq: Once | OROMUCOSAL | Status: DC

## 2020-11-05 MED ORDER — CHLORHEXIDINE GLUCONATE 0.12 % MT SOLN
15.0000 mL | Freq: Once | OROMUCOSAL | Status: DC
Start: 2020-11-05 — End: 2020-11-05

## 2020-11-05 MED ORDER — GENTAMICIN SULFATE 40 MG/ML IJ SOLN
5.0000 mg/kg | INTRAVENOUS | Status: DC
  Filled 2020-11-05: qty 7

## 2020-11-05 SURGICAL SUPPLY — 18 items
BAG URINE DRAIN 2000ML AR STRL (UROLOGICAL SUPPLIES) ×2 IMPLANT
CATH FOL 2WAY LX 16X5 (CATHETERS) ×2 IMPLANT
CATH FOLEY 2W COUNCIL 20FR 5CC (CATHETERS) IMPLANT
CATH SET URETHRAL DILATOR (CATHETERS) ×2 IMPLANT
ELECT REM PT RETURN 9FT ADLT (ELECTROSURGICAL) ×2
ELECTRODE REM PT RTRN 9FT ADLT (ELECTROSURGICAL) ×1 IMPLANT
GAUZE 4X4 16PLY ~~LOC~~+RFID DBL (SPONGE) ×4 IMPLANT
GLOVE SURG UNDER POLY LF SZ7.5 (GLOVE) ×2 IMPLANT
GOWN STRL REUS W/ TWL XL LVL3 (GOWN DISPOSABLE) ×2 IMPLANT
GOWN STRL REUS W/TWL XL LVL3 (GOWN DISPOSABLE) ×2
GUIDEWIRE STR DUAL SENSOR (WIRE) ×2 IMPLANT
HOLDER FOLEY CATH W/STRAP (MISCELLANEOUS) ×2 IMPLANT
MANIFOLD NEPTUNE II (INSTRUMENTS) IMPLANT
PACK CYSTO AR (MISCELLANEOUS) ×2 IMPLANT
SET CYSTO W/LG BORE CLAMP LF (SET/KITS/TRAYS/PACK) ×2 IMPLANT
SYR 30ML LL (SYRINGE) ×2 IMPLANT
WATER STERILE IRR 1000ML POUR (IV SOLUTION) ×2 IMPLANT
WATER STERILE IRR 3000ML UROMA (IV SOLUTION) ×2 IMPLANT

## 2020-11-05 NOTE — ED Provider Notes (Signed)
[facility] Emergency Department Provider Note  ____________________________________________   None    (approximate)  I have reviewed the triage vital signs and the nursing notes.   HISTORY  Chief Complaint Fall   HPI [name] [name] is a 87 y.o. male with a past medical history of HTN, HDL, GERD, CKD, symptomatic bradycardia status post pacemaker anticoagulated on Coumadin, aortic atherosclerosis and chronic anemia who presents to the ED after referred by anesthesiology after patient was initially supposed to have a routine urethral dilation done today but was deferred after he reported several falls last couple days.  Patient fell twice last week most recently on Friday fifth.  He has been holding his Coumadin for couple days anticipation of surgery.  He is accompanied by his daughter.  He states that he thinks he slipped.  Were not gripping the floor.  He does not think he passed out before falling.  He does note he had an right side of his face and the left side of his abdomen.  He denies any current significant headache, neck pain, chest pain or pain in any extremities.  No recent fevers, cough, vomiting, [place] dysuria, rash or other clear acute sick symptoms.         Past Medical History:  Diagnosis Date   Anemia    Aortic atherosclerosis ([employer])    Balance problems    uses walker   Cardiac pacemaker in situ 07/2005   symptomatic bradycardia   CKD (chronic kidney disease) stage 3, GFR 30-59 ml/min ([name]) 04/13/2017   Closed right hip fracture, initial encounter ([place]) 02/14/2019   Diverticulosis of colon (without mention of hemorrhage)    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    GERD (gastroesophageal reflux disease)    Glaucoma    Hyperlipidemia    Hypertension    Neck arthritis, Severe, multi-level 06/09/2013   Paroxysmal atrial fibrillation ([name])    s/p failed ablation   Prostate cancer ([place]) 12/2000   5 weeks XRT + brachytherapy    Radiation proctitis    Sinoatrial node dysfunction ([name])    Skin cancer    Skin cancer of eyelid, left 10/2007   Unspecified glaucoma(365.9)    Unspecified hypothyroidism    Vitamin B12 deficiency     Patient Active Problem List   Diagnosis Date Noted   Aortic atherosclerosis ([place]) 10/22/2020   Age-related osteoporosis with current pathological fracture with routine healing 02/21/2019   Chronic kidney disease (CKD), stage IV (severe) ([place]) 02/21/2019   Longstanding persistent atrial fibrillation ([place])    Glaucoma    Long term (current) use of anticoagulants 06/24/2010   GLAUCOMA 08/07/2008   GERD 08/07/2008   PACEMAKER, PERMANENT 08/07/2008   Vitamin B 12 deficiency 07/19/2008   RADIATION PROCTITIS 07/18/2008   DIVERTICULOSIS, COLON 07/18/2008   Prostate cancer, in remission 07/18/2008   Essential hypertension 05/09/2008   Hyperlipidemia 02/06/2008   Atrial fibrillation ([place]) 02/06/2008   Hypothyroidism 12/01/2007    Past Surgical History:  Procedure Laterality Date   [place] AND ABLATION  2001, 2003   failure   CATARACT EXTRACTION     CYSTOGRAM N/A 09/21/2018   Procedure: CYSTOGRAM;  Surgeon: Abbie Sons, MD;  Location: [facility] ORS;  Service: Urology;  Laterality: N/A;   CYSTOSCOPY WITH URETHRAL DILATATION N/A 01/29/2017   Procedure: CYSTOSCOPY WITH URETHRAL DILATATION;  Surgeon: Abbie Sons, MD;  Location: [facility] ORS;  Service: Urology;  Laterality: N/A;   CYSTOSCOPY  WITH URETHRAL DILATATION N/A 09/21/2018   Procedure: CYSTOSCOPY WITH URETHRAL DILATATION;  Surgeon: Abbie Sons, MD;  Location: [facility] ORS;  Service: Urology;  Laterality: N/A;   CYSTOURETHROSCOPY N/A 11/07/2014   EP IMPLANTABLE DEVICE N/A 02/24/2016   Procedure: PPM Generator Changeout;  Surgeon: Deboraha Sprang, MD;  Location: [facility];  Service: Cardiovascular;  Laterality: N/A;   gastric ulcer surgery     HERNIA REPAIR  1994, 2001,  2003   s/p drainage and complications, 09/9022, 0/9735 mesh removal   HIP ARTHROPLASTY Right 02/16/2019   Procedure: RIGHT ANTERIOR HIP HEMIARTHROPLASTY,CEMENTED;  Surgeon: Hessie Knows, MD;  Location: [facility] ORS;  Service: Orthopedics;  Laterality: Right;   INSERT / REPLACE / REMOVE PACEMAKER  07/2005   symptomatic bradycardia   kidney stones     NOSE SURGERY     cancer removed    PROSTATE SURGERY     SKIN CANCER DESTRUCTION     TOOTH EXTRACTION      Prior to Admission medications   Medication Sig Start Date End Date Taking? Authorizing Provider  acetaminophen (TYLENOL) 500 MG tablet Take 500 mg by mouth every 6 (six) hours as needed for moderate pain or headache.    [provider]  alendronate (FOSAMAX) 70 MG tablet TAKE 1 TABLET BY MOUTH EVERY 7 DAYS WITH A FULL GLASS OF WATER AND ON AN EMPTY STOMACH 04/30/20   Copland, Frederico Hamman, MD  amiodarone (PACERONE) 200 MG tablet TAKE 1/2 TABLET(100 MG) BY MOUTH DAILY Patient taking differently: Take 100 mg by mouth at bedtime. 03/27/20   Deboraha Sprang, MD  cyanocobalamin (,VITAMIN B-12,) 1000 MCG/ML injection Inject 1,000 mcg into the muscle every 30 (thirty) days.    [provider]  docusate sodium (COLACE) 50 MG capsule Take 50 mg by mouth 2 (two) times daily as needed for mild constipation.    [provider]  dorzolamide-timolol (COSOPT) 22.3-6.8 MG/ML ophthalmic solution Place 1 drop into both eyes 2 (two) times daily.    [provider]  ferrous sulfate 325 (65 FE) MG EC tablet Take 325 mg by mouth daily with breakfast.    [provider]  furosemide (LASIX) 20 MG tablet Take 1 tablet (20 mg total) by mouth daily. Patient taking differently: Take 20 mg by mouth daily before lunch. 10/16/20   Copland, Frederico Hamman, MD  levothyroxine (SYNTHROID) 100 MCG tablet TAKE 1 TABLET BY MOUTH DAILY BEFORE BREAKFAST Patient taking differently: Take 100 mcg by mouth daily before breakfast. 09/18/20   Copland, Frederico Hamman,  MD  loperamide (IMODIUM) 2 MG capsule Take 2 mg by mouth as needed for diarrhea or loose stools.    [provider]  Multiple Vitamin (MULTIVITAMIN) tablet Take 1 tablet by mouth daily.    [provider]  pantoprazole (PROTONIX) 40 MG tablet TAKE 1 TABLET(40 MG) BY MOUTH DAILY Patient taking differently: Take 40 mg by mouth every morning. 08/05/20   Copland, Frederico Hamman, MD  polyethylene glycol (MIRALAX / GLYCOLAX) packet Take 17 g by mouth daily as needed for moderate constipation.     [provider]  pravastatin (PRAVACHOL) 40 MG tablet TAKE 1/2 TABLET(20 MG) BY MOUTH AT BEDTIME Patient taking differently: Take 20 mg by mouth at bedtime. 07/04/20   Copland, Frederico Hamman, MD  simethicone (MYLICON) 329 MG chewable tablet Chew 125 mg by mouth every 6 (six) hours as needed for flatulence.    [provider]  sulfamethoxazole-trimethoprim (BACTRIM DS) 800-160 MG tablet Take 1 tablet by mouth 2 (two)  times daily for 7 days. 10/30/20 11/06/20  Stoioff, Ronda Fairly, MD  tamsulosin (FLOMAX) 0.4 MG CAPS capsule TAKE 1 CAPSULE BY MOUTH DAILY Patient taking differently: Take 0.4 mg by mouth daily after breakfast. 09/18/20   Copland, Frederico Hamman, MD  warfarin (COUMADIN) 1 MG tablet TAKE 1 TO 1 AND 1/2 TABLETS BY MOUTH DAILY AS DIRECTED BY COUMADIN CLINIC Patient taking differently: Take 1 mg by mouth daily at 4 PM. 08/05/20   Deboraha Sprang, MD  Wheat Dextrin (BENEFIBER DRINK MIX PO) Take 1 scoop by mouth daily.    [provider]    Allergies Penicillins  Family History  Problem Relation Age of Onset   Breast cancer Mother    Bone cancer Father    Alcohol abuse Other    Coronary artery disease Other    Dementia Other    Diabetes Other     Social History Social History   Tobacco Use   Smoking status: Never   Smokeless tobacco: Never  Vaping Use   Vaping Use: Never used  Substance Use Topics   Alcohol use: No   Drug use: No    Review of Systems  Review of Systems   Constitutional:  Negative for chills and fever.  HENT:  Negative for sore throat.   Eyes:  Negative for pain.  Respiratory:  Negative for cough and stridor.   Cardiovascular:  Negative for chest pain.  Gastrointestinal:  Positive for abdominal pain (L abdomen). Negative for vomiting.  Genitourinary:  Positive for flank pain (L flank).  Musculoskeletal:  Positive for falls.  Skin:  Negative for rash.  Neurological:  Negative for seizures, loss of consciousness and headaches.  Endo/Heme/Allergies:  Bruises/bleeds easily.  Psychiatric/Behavioral:  Negative for suicidal ideas.   All other systems reviewed and are negative.    ____________________________________________   PHYSICAL EXAM:  VITAL SIGNS: ED Triage Vitals  Enc Vitals Group     BP 11/05/20 1239 101/61     Pulse Rate 11/05/20 1239 60     Resp 11/05/20 1239 16     Temp 11/05/20 1239 97.7 F (36.5 C)     Temp Source 11/05/20 1239 Oral     SpO2 11/05/20 1239 98 %     Weight 11/05/20 1239 123 lb (55.8 kg)     Height 11/05/20 1239 5\' 8"  (1.727 m)     Head Circumference --      Peak Flow --      Pain Score 11/05/20 1253 3     Pain Loc --      Pain Edu? --      Excl. in [place]? --    Vitals:   11/05/20 1430 11/05/20 1500  BP: 117/69 130/68  Pulse: 62 62  Resp: 20 20  Temp:    SpO2: 100% 93%   Physical Exam Vitals and nursing note reviewed.  Constitutional:      Appearance: He is well-developed.  HENT:     Head: Normocephalic and atraumatic.     Right Ear: External ear normal.     Left Ear: External ear normal.     Nose: Nose normal.  Eyes:     Conjunctiva/sclera: Conjunctivae normal.  Cardiovascular:     Rate and Rhythm: Normal rate and regular rhythm.     Heart sounds: No murmur heard. Pulmonary:     Effort: Pulmonary effort is normal. No respiratory distress.     Breath sounds: Normal breath sounds.  Abdominal:     Palpations: Abdomen is soft.  Tenderness: There is no abdominal tenderness.   Musculoskeletal:     Cervical back: Neck supple.  Skin:    General: Skin is warm and dry.     Capillary Refill: Capillary refill takes more than 3 seconds.  Neurological:     Mental Status: He is alert.  Psychiatric:        Mood and Affect: Mood normal.    Patient has some extensive ecchymosis around the right side of his face.  He has some ecchymosis and tenderness over his left abdomen and left flank.  There is no tenderness to proximal deformities over the C/T/L-spine.  2+ radial pulse.  Patient has scattered Mckeag Moses bilateral upper extremities but no point tenderness effusion or deformity at the bilateral shoulders, elbows, wrists, knees or ankles.  No significant chest wall tenderness.  Level breath sounds heard bilaterally. ____________________________________________   LABS (all labs ordered are listed, but only abnormal results are displayed)  Labs Reviewed  RESP PANEL BY RT-PCR (FLU A&B, COVID) ARPGX2  CBC WITH DIFFERENTIAL/PLATELET  COMPREHENSIVE METABOLIC PANEL  PROTIME-INR  TYPE AND SCREEN   ____________________________________________  EKG  A. fib with a ventricular rate of 60, nonspecific changes throughout. ____________________________________________  RADIOLOGY  ED MD interpretation: CT head shows no evidence of acute intracranial abnormality.  Some small vessel chronic microvascular ischemic changes are noted.  There is a small left mastoid effusion.  CT C-spine remarkable for small left apical pneumothorax without evidence of acute C-spine fracture.  There are some chronic changes noted.  Official radiology report(s): CT HEAD WO CONTRAST (5MM)  Result Date: 11/05/2020 CLINICAL DATA:  Facial trauma EXAM: CT HEAD WITHOUT CONTRAST CT CERVICAL SPINE WITHOUT CONTRAST TECHNIQUE: Multidetector CT imaging of the head and cervical spine was performed following the standard protocol without intravenous contrast. Multiplanar CT image reconstructions of the cervical  spine were also generated. COMPARISON:  CT head Aug 19, 2005. CTA neck 11/18/2004. Cervical radiographs June 07, 2013. FINDINGS: CT HEAD FINDINGS Brain: No evidence of acute infarction, hemorrhage, hydrocephalus, extra-axial collection or mass lesion/mass effect. Mild for age scattered white matter hypoattenuation, nonspecific but likely related to chronic microvascular ischemic disease. Mild for age atrophy with ex vacuo ventricular dilation. Vascular: No hyperdense vessel identified. Calcific intracranial atherosclerosis. Skull: No acute fracture. No acute findings in the visualized orbits. Sinuses/Orbits: Mild paranasal sinus mucosal thickening without visualized air-fluid levels. Other: Small left mastoid effusion. CT CERVICAL SPINE FINDINGS Alignment: There is reversal of the normal cervical lordosis with kyphosis centered in the mid to lower cervical spine, similar to the priors. Approximately 3 mm of anterolisthesis of C3 on C4 and C4 on C5, increased from the remote priors and most likely degenerative given severe facet arthropathy at these levels. Broad levocurvature. Skull base and vertebrae: No evidence of acute fracture. Chronic degenerative remodeling of multiple vertebral bodies. Osteopenia. Soft tissues and spinal canal: No prevertebral fluid or swelling. No visible canal hematoma. Disc levels: Multilevel degenerative disease, most pronounced and severe at C5-C6 and C6-C7. Moderate degenerative disease at other levels. Severe facet arthropathy on the right at C3-C4 and C4-C5. Multilevel neural foraminal stenosis, potentially severe on the right at C3-C4. Craniocervical degenerative change narrowing of the atlantodental interval Upper chest: Biapical pleuroparenchymal scarring. Small left apical pneumothorax. Other: Carotid bifurcation atherosclerosis. IMPRESSION: CT head: 1. No evidence of acute intracranial abnormality. 2. Mild for age chronic microvascular ischemic disease and atrophy. 3. Small  left mastoid effusion. CT cervical spine: 1. Small left apical pneumothorax, partially imaged. Recommend dedicated  chest imaging. 2. No evidence of acute fracture. 3. Chronic reversal of the normal cervical lordosis with approximately 3 mm of anterolisthesis of C3 on C4 and C4 on C5, increased from the remote priors and most likely degenerative given severe facet arthropathy at these levels. 4. Multilevel severe degenerative disc disease and facet arthropathy with potentially severe foraminal stenosis on the right at C3-C4. MRI of the cervical spine could better characterize the canal/foramina if clinically indicated. These results will be called to the ordering clinician or representative by the Radiologist Assistant, and communication documented in the PACS or [employer]. Electronically Signed   By: Margaretha Sheffield MD   On: 11/05/2020 13:48   CT Cervical Spine Wo Contrast  Result Date: 11/05/2020 CLINICAL DATA:  Facial trauma EXAM: CT HEAD WITHOUT CONTRAST CT CERVICAL SPINE WITHOUT CONTRAST TECHNIQUE: Multidetector CT imaging of the head and cervical spine was performed following the standard protocol without intravenous contrast. Multiplanar CT image reconstructions of the cervical spine were also generated. COMPARISON:  CT head Aug 19, 2005. CTA neck 11/18/2004. Cervical radiographs June 07, 2013. FINDINGS: CT HEAD FINDINGS Brain: No evidence of acute infarction, hemorrhage, hydrocephalus, extra-axial collection or mass lesion/mass effect. Mild for age scattered white matter hypoattenuation, nonspecific but likely related to chronic microvascular ischemic disease. Mild for age atrophy with ex vacuo ventricular dilation. Vascular: No hyperdense vessel identified. Calcific intracranial atherosclerosis. Skull: No acute fracture. No acute findings in the visualized orbits. Sinuses/Orbits: Mild paranasal sinus mucosal thickening without visualized air-fluid levels. Other: Small left mastoid effusion. CT  CERVICAL SPINE FINDINGS Alignment: There is reversal of the normal cervical lordosis with kyphosis centered in the mid to lower cervical spine, similar to the priors. Approximately 3 mm of anterolisthesis of C3 on C4 and C4 on C5, increased from the remote priors and most likely degenerative given severe facet arthropathy at these levels. Broad levocurvature. Skull base and vertebrae: No evidence of acute fracture. Chronic degenerative remodeling of multiple vertebral bodies. Osteopenia. Soft tissues and spinal canal: No prevertebral fluid or swelling. No visible canal hematoma. Disc levels: Multilevel degenerative disease, most pronounced and severe at C5-C6 and C6-C7. Moderate degenerative disease at other levels. Severe facet arthropathy on the right at C3-C4 and C4-C5. Multilevel neural foraminal stenosis, potentially severe on the right at C3-C4. Craniocervical degenerative change narrowing of the atlantodental interval Upper chest: Biapical pleuroparenchymal scarring. Small left apical pneumothorax. Other: Carotid bifurcation atherosclerosis. IMPRESSION: CT head: 1. No evidence of acute intracranial abnormality. 2. Mild for age chronic microvascular ischemic disease and atrophy. 3. Small left mastoid effusion. CT cervical spine: 1. Small left apical pneumothorax, partially imaged. Recommend dedicated chest imaging. 2. No evidence of acute fracture. 3. Chronic reversal of the normal cervical lordosis with approximately 3 mm of anterolisthesis of C3 on C4 and C4 on C5, increased from the remote priors and most likely degenerative given severe facet arthropathy at these levels. 4. Multilevel severe degenerative disc disease and facet arthropathy with potentially severe foraminal stenosis on the right at C3-C4. MRI of the cervical spine could better characterize the canal/foramina if clinically indicated. These results will be called to the ordering clinician or representative by the Radiologist Assistant, and  communication documented in the PACS or [employer]. Electronically Signed   By: Margaretha Sheffield MD   On: 11/05/2020 13:48   DG OR UROLOGY CYSTO IMAGE ([place])  Result Date: 11/05/2020 There is no interpretation for this exam.  This order is for images obtained during a surgical procedure.  Please See "Surgeries" Tab for more information regarding the procedure.    ____________________________________________   PROCEDURES  Procedure(s) performed (including Critical Care):  .1-3 Lead EKG Interpretation  Date/Time: 11/05/2020 3:24 PM Performed by: Lucrezia Starch, MD Authorized by: Lucrezia Starch, MD     Interpretation: non-specific     ECG rate assessment: normal     Rhythm: sinus rhythm     Ectopy: none     ____________________________________________   INITIAL IMPRESSION / ASSESSMENT AND PLAN / ED COURSE      Patient presents with above-stated history and exam for assessment after he was referred to the ED for evaluation after reportedly had a couple falls last week.  Patient does state he hit his head and the left side of his abdomen.    CT head shows no evidence of acute intracranial abnormality.  Some small vessel chronic microvascular ischemic changes are noted.  There is a small left mastoid effusion.  CT C-spine remarkable for small left apical pneumothorax without evidence of acute C-spine fracture.  There are some chronic changes noted.  INR drawn earlier this morning was reviewed and noted to be 6.3  Will place patient on nasal cannula oxygen given elevated INR and age will obtain full trauma pan scans.  We will also obtain a CBC, CMP and type and screen  Care patient signed over to provider approximately 1500.  Plan is to follow-up repeat lab work and imaging.   ____________________________________________   FINAL CLINICAL IMPRESSION(S) / ED DIAGNOSES  Final diagnoses:  Fall  Traumatic pneumothorax, initial encounter  Elevated INR  Recurrent  falls    Medications - No data to display   ED Discharge Orders     None        Note:  This document was prepared using Dragon voice recognition software and may include unintentional dictation errors.    Lucrezia Starch, MD 11/05/20 [phone]

## 2020-11-05 NOTE — ED Notes (Signed)
Pt transported to CT via stretcher at this time.  

## 2020-11-05 NOTE — Progress Notes (Signed)
Patient's surgery was cancelled due to two recent falls. The patient was not seen in a healthcare facility after the occurrence of these falls and anesthesia would like him evaluated prior to having a procedure.

## 2020-11-05 NOTE — ED Notes (Signed)
Pt in bed, pt oriented, pt states that he was scheduled for surgery, but is here because the anaesthesiologist wanted him evaluated for a facial bruise.  Pt has bruising to face, pt c/o L Rib/flank pain, pt on HR and O2 sat monitor, pt satting 98 on room air

## 2020-11-05 NOTE — ED Notes (Signed)
Archie Balboa MD notified of critical INR.

## 2020-11-05 NOTE — ED Provider Notes (Signed)
CT imaging notable for multiple left sided rib fractures, small pneumothorax and small pleural effusion. Patient not in any respiratory distress. Discussed with Dr. Kipp Brood with cardiothoracic surgery. At this time given that the fall was 4 days ago did not think any emergent intervention was necessary. Did recommend follow up in cardiothoracic clinic. In terms of INR it is elevated however no indication of active bleeding. Discussed continuing to hold warfarin dose and getting INR rechecked tomorrow. Also discussed that they would have to speak to urology to reschedule procedure.    Nance Pear, MD 11/05/20 2106

## 2020-11-05 NOTE — ED Notes (Signed)
Pt ambulated well with walker. Daughter states "he is doing much better walking than I expected". MD notified.

## 2020-11-05 NOTE — ED Triage Notes (Signed)
Pt to ED from same day surgery , was supposed to have procedure was sent to ER for evaluation, has had 2 falls since Friday (daughter reports this is normal d/t bad balance), taking coumadin and hit head Pt oriented, NAD noted Bruising noted to right eye and forehead

## 2020-11-05 NOTE — Discharge Instructions (Addendum)
As we discussed please hold warfarin tonight as well. Please call your physician to schedule an INR check for tomorrow. In terms of the rib fractures and pneumothorax please follow up with Dr. Kipp Brood. Please seek medical attention for any bleeding, worsening pain, shortness of breath, cough or any other new or concerning symptoms.

## 2020-11-05 NOTE — ED Notes (Signed)
Incentive spirometer provided to the patient and pt educated on how to use as well as did return demonstration.

## 2020-11-05 NOTE — ED Notes (Signed)
Pt in bed, pt states that his pain is a 6/10, states that he doesn't need anything for pain at this time, family at bedside, pt satting 96% on room air.

## 2020-11-06 ENCOUNTER — Encounter: Payer: Self-pay | Admitting: Urgent Care

## 2020-11-06 ENCOUNTER — Ambulatory Visit (INDEPENDENT_AMBULATORY_CARE_PROVIDER_SITE_OTHER): Payer: Medicare Other

## 2020-11-06 DIAGNOSIS — Z7901 Long term (current) use of anticoagulants: Secondary | ICD-10-CM | POA: Diagnosis not present

## 2020-11-06 DIAGNOSIS — I4891 Unspecified atrial fibrillation: Secondary | ICD-10-CM

## 2020-11-06 DIAGNOSIS — I480 Paroxysmal atrial fibrillation: Secondary | ICD-10-CM | POA: Diagnosis not present

## 2020-11-06 LAB — POCT INR: INR: 7.7 — AB (ref 2.0–3.0)

## 2020-11-06 NOTE — Patient Instructions (Signed)
-   your INR may not be as high as 7.7, but since we don't want to go to the lab, we will dose your warfarin based on the ED's results - continue holding warfarin for the next 4 days - on Sunday, start taking 1/2 tablet warfarin every day  - Recheck INR next week

## 2020-11-07 ENCOUNTER — Telehealth: Payer: Self-pay

## 2020-11-07 NOTE — Telephone Encounter (Signed)
Received call from daughter. Patient was scheduled to have surgery next week. He had had two falls over the last few days to when they went to get evaluation by anesthesia they were concerned about falls with blood thinner. Was seen to ED for evaluation. Was found to have Traumatic pneumothorax. Per daughter will have follow up with specialist next month for follow up. And is being monitored for INR levels by cardiology due to change.   Her question for Dr. Lorelei Pont. Was told to take lasix one 20 mg daily until procedure to help with leg swelling and go to one tab every other day after procedure. She wanted to know how to procedure  now that procedure has been post postponed and no current date scheduled. She states that at one tab daily the swelling has been very minimum.   Advised that someone will return her call on her cell phone at [phone].

## 2020-11-07 NOTE — Telephone Encounter (Signed)
  I would still do lasix every other day.  It is a balancing act between swelling and kidney function.  His kidney function got a little worse with the higher lasix dose.  I would do lasix every other day.  Occasional extra lasix tablet when swellng is bad is ok.

## 2020-11-07 NOTE — Telephone Encounter (Signed)
La[place] notified as instructed by telephone.

## 2020-11-10 ENCOUNTER — Other Ambulatory Visit: Payer: Self-pay | Admitting: Internal Medicine

## 2020-11-13 ENCOUNTER — Telehealth: Payer: Self-pay | Admitting: Family Medicine

## 2020-11-13 ENCOUNTER — Other Ambulatory Visit: Payer: Self-pay

## 2020-11-13 ENCOUNTER — Ambulatory Visit (INDEPENDENT_AMBULATORY_CARE_PROVIDER_SITE_OTHER): Payer: Medicare Other

## 2020-11-13 DIAGNOSIS — Z7901 Long term (current) use of anticoagulants: Secondary | ICD-10-CM

## 2020-11-13 DIAGNOSIS — Z5181 Encounter for therapeutic drug level monitoring: Secondary | ICD-10-CM

## 2020-11-13 DIAGNOSIS — I480 Paroxysmal atrial fibrillation: Secondary | ICD-10-CM

## 2020-11-13 LAB — POCT INR: INR: 1.9 — AB (ref 2.0–3.0)

## 2020-11-13 MED ORDER — TRAMADOL HCL 50 MG PO TABS: 25.0000 mg | ORAL_TABLET | Freq: Three times a day (TID) | ORAL | 0 refills | Status: DC | PRN

## 2020-11-13 NOTE — Telephone Encounter (Signed)
I sent him in some Tramadol. Try 1/2 tablet first to see if that helps enough.  1 tablet is ok.  He can't take NSAIDS, and I don't want him to take stronger pain medicatoin.

## 2020-11-13 NOTE — Patient Instructions (Signed)
-   START NEW DOSAGE of warfarin of 1/2 tablet every day EXCEPT 1 tablet on FRIDAYS  - Recheck INR in 2 weeks

## 2020-11-13 NOTE — Telephone Encounter (Signed)
Pt daughter called he had a fall on 8/9 and is still having rib pain. Pt daughter wanted to know what else he could take since the tylenol isn't helping. Pt has an appt scheduled for 8/24

## 2020-11-13 NOTE — Telephone Encounter (Signed)
Laurel notified as instructed by telephone.  States understanding.

## 2020-11-13 NOTE — Addendum Note (Signed)
Addended by: Owens Loffler on: 11/13/2020 04:21 PM   Modules accepted: Orders

## 2020-11-18 NOTE — Progress Notes (Signed)
Remote pacemaker transmission.   

## 2020-11-20 ENCOUNTER — Telehealth: Payer: Self-pay

## 2020-11-20 ENCOUNTER — Encounter: Payer: Self-pay | Admitting: Family Medicine

## 2020-11-20 ENCOUNTER — Other Ambulatory Visit: Payer: Self-pay

## 2020-11-20 ENCOUNTER — Ambulatory Visit (INDEPENDENT_AMBULATORY_CARE_PROVIDER_SITE_OTHER): Payer: Medicare Other | Admitting: Family Medicine

## 2020-11-20 VITALS — BP 100/60 | HR 64 | Temp 97.9°F | Ht 68.0 in | Wt 124.8 lb

## 2020-11-20 DIAGNOSIS — S270XXA Traumatic pneumothorax, initial encounter: Secondary | ICD-10-CM

## 2020-11-20 DIAGNOSIS — S2242XA Multiple fractures of ribs, left side, initial encounter for closed fracture: Secondary | ICD-10-CM

## 2020-11-20 DIAGNOSIS — K921 Melena: Secondary | ICD-10-CM | POA: Diagnosis not present

## 2020-11-20 DIAGNOSIS — E860 Dehydration: Secondary | ICD-10-CM

## 2020-11-20 DIAGNOSIS — N179 Acute kidney failure, unspecified: Secondary | ICD-10-CM | POA: Diagnosis not present

## 2020-11-20 DIAGNOSIS — R791 Abnormal coagulation profile: Secondary | ICD-10-CM

## 2020-11-20 DIAGNOSIS — S2249XA Multiple fractures of ribs, unspecified side, initial encounter for closed fracture: Secondary | ICD-10-CM | POA: Diagnosis not present

## 2020-11-20 MED ORDER — TRAMADOL HCL 50 MG PO TABS: 50.0000 mg | ORAL_TABLET | Freq: Four times a day (QID) | ORAL | 1 refills | Status: DC | PRN

## 2020-11-20 MED ORDER — MIRTAZAPINE 15 MG PO TABS: 15.0000 mg | ORAL_TABLET | Freq: Every day | ORAL | 3 refills | Status: AC

## 2020-11-20 NOTE — Patient Instructions (Addendum)
On the first night, only take the Mirtazepime at bedtime to see if it makes your drowsy. I want to make sure that you will do ok with this new medicine and the tramadol combined.   Hold your furosemide now

## 2020-11-20 NOTE — Progress Notes (Signed)
Caylin Raby T. Mitzi Lilja, MD, [place] at [facility] [place], [address]  Phone: [phone]  FAX: [place] y.o. male  MRN [id number]  Date of Birth: [birth year]  Date: 11/20/2020  PCP: Owens Loffler, MD  Referral: Owens Loffler, MD  Chief Complaint  Patient presents with   Fall   Rib Injury    Seen in ED 11/05/20    This visit occurred during the SARS-CoV-2 public health emergency.  Safety protocols were in place, including screening questions prior to the visit, additional usage of staff PPE, and extensive cleaning of exam room while observing appropriate contact time as indicated for disinfecting solutions.   Subjective:   [name] [name] is a 87 y.o. very pleasant male patient with Body mass index is 18.97 kg/m. who presents with the following:  The patient is here for hospital follow-up after a fall on November 05, 2020.  At that time he had had several falls and he thinks that he did slip.  While at the hospital, he had multiple studies done including CT the head, cervical spine, chest abdomen and pelvis, T-spine as well as L-spine.  While in the ER, the patient had a creatinine of 3.6 and a BUN of 79.  He has a baseline creatinine of 2.3.  In the ER his INR was actually 6.3.  Most remarkable findings on imaging: Small left-sided apical pneumothorax.  Multilevel degenerative disc disease throughout the entirety of the spine with multilevel arthropathy.  Small left pleural effusion.  Fifth, sixth, seventh, eighth, ninth ribs are all fractures on the anterolateral left.  5-9 l rib fractures  ER physician spoke with Dr. Kipp Brood, and they felt that he would be safe to discharge without any intervention. Small pneumothorax F/u Dr. Kipp Brood -  follow-up with Dr. Kipp Brood.  ? F/u at all or needed or repeat CT  Pain right now 7/10.   Wt Readings from Last 3 Encounters:   11/20/20 124 lb 12 oz (56.6 kg)  11/05/20 123 lb (55.8 kg)  11/05/20 123 lb (55.8 kg)    Having some black stool. Stopped iron pills for about a week.   Urology procedure.  This was canceled due to his polytrauma.  Review of Systems is noted in the HPI, as appropriate  Patient Active Problem List   Diagnosis Date Noted   Longstanding persistent atrial fibrillation ([place])     Priority: Medium   PACEMAKER, PERMANENT 08/07/2008    Priority: Medium   Atrial fibrillation ([place]) 02/06/2008    Priority: Medium   Aortic atherosclerosis ([place]) 10/22/2020   Age-related osteoporosis with current pathological fracture with routine healing 02/21/2019   Chronic kidney disease (CKD), stage IV (severe) ([place]) 02/21/2019   Glaucoma    Long term (current) use of anticoagulants 06/24/2010   GLAUCOMA 08/07/2008   GERD 08/07/2008   Vitamin B 12 deficiency 07/19/2008   RADIATION PROCTITIS 07/18/2008   DIVERTICULOSIS, COLON 07/18/2008   Prostate cancer, in remission 07/18/2008   Essential hypertension 05/09/2008   Hyperlipidemia 02/06/2008   Hypothyroidism 12/01/2007    Past Medical History:  Diagnosis Date   Anemia    Aortic atherosclerosis ([name])    Balance problems    uses walker   Cardiac pacemaker in situ 07/2005   symptomatic bradycardia   CKD (chronic kidney disease) stage 3, GFR 30-59 ml/min ([name]) 04/13/2017   Closed right hip fracture, initial encounter ([place]) 02/14/2019   Diverticulosis of colon (  without mention of hemorrhage)    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    GERD (gastroesophageal reflux disease)    Glaucoma    Hyperlipidemia    Hypertension    Neck arthritis, Severe, multi-level 06/09/2013   Paroxysmal atrial fibrillation ([name])    s/p failed ablation   Prostate cancer ([place]) 12/2000   5 weeks XRT + brachytherapy   Radiation proctitis    Sinoatrial node dysfunction ([name])    Skin cancer    Skin cancer of eyelid, left 10/2007   Unspecified glaucoma(365.9)     Unspecified hypothyroidism    Vitamin B12 deficiency     Past Surgical History:  Procedure Laterality Date   [place] AND ABLATION  2001, 2003   failure   CATARACT EXTRACTION     CYSTOGRAM N/A 09/21/2018   Procedure: CYSTOGRAM;  Surgeon: Abbie Sons, MD;  Location: [facility] ORS;  Service: Urology;  Laterality: N/A;   CYSTOSCOPY WITH URETHRAL DILATATION N/A 01/29/2017   Procedure: CYSTOSCOPY WITH URETHRAL DILATATION;  Surgeon: Abbie Sons, MD;  Location: [facility] ORS;  Service: Urology;  Laterality: N/A;   CYSTOSCOPY WITH URETHRAL DILATATION N/A 09/21/2018   Procedure: CYSTOSCOPY WITH URETHRAL DILATATION;  Surgeon: Abbie Sons, MD;  Location: [facility] ORS;  Service: Urology;  Laterality: N/A;   CYSTOURETHROSCOPY N/A 11/07/2014   EP IMPLANTABLE DEVICE N/A 02/24/2016   Procedure: PPM Generator Changeout;  Surgeon: Deboraha Sprang, MD;  Location: [facility];  Service: Cardiovascular;  Laterality: N/A;   gastric ulcer surgery     HERNIA REPAIR  1994, 2001, 2003   s/p drainage and complications, 03/930, 05/5571 mesh removal   HIP ARTHROPLASTY Right 02/16/2019   Procedure: RIGHT ANTERIOR HIP HEMIARTHROPLASTY,CEMENTED;  Surgeon: Hessie Knows, MD;  Location: [facility] OR[facility];  Service: Orthopedics;  Laterality: Right;   INSERT / REPLACE / REMOVE PACEMAKER  07/2005   symptomatic bradycardia   kidney stones     NOSE SURGERY     cancer removed    PROSTATE SURGERY     SKIN CANCER DESTRUCTION     TOOTH EXTRACTION      Family History  Problem Relation Age of Onset   Breast cancer Mother    Bone cancer Father    Alcohol abuse Other    Coronary artery disease Other    Dementia Other    Diabetes Other      Objective:   BP 100/60   Pulse 64   Temp 97.9 F (36.6 C) (Temporal)   Ht _0  (1.727 m)   Wt 124 lb 12 oz (56.6 kg)   SpO2 99%   BMI 18.97 kg/m   GEN: No acute distress; alert,appropriate. PULM: Breathing comfortably  in no respiratory distress PSYCH: Normally interactive.  Facial bruising. Anterolateral discomfort at the ribs with gentle palpation.  Laboratory and Imaging Data: Lab Review:  CBC EXTENDED Latest Ref Rng & Units 11/05/2020 10/28/2020 04/17/2020  WBC 4.0 - 10.5 K/uL 6.5 6.3 5.6  RBC 4.22 - 5.81 MIL/uL 3.22(L) 3.44(L) 3.35(L)  HGB 13.0 - 17.0 g/dL 10.3(L) 11.1(L) 10.9(L)  HCT 39.0 - 52.0 % 31.8(L) 35.4(L) 34.1(L)  PLT 150 - 400 K/uL 193 216 177.0  NEUTROABS 1.7 - 7.7 K/uL 4.9 - 3.2  LYMPHSABS 0.7 - 4.0 K/uL 1.1 - 1.5    BMP Latest Ref Rng & Units 11/05/2020 10/16/2020 09/11/2020  Glucose 70 - 99 mg/dL 102(H) 92 82  BUN 8 - 23 mg/dL 79(H) 61(H) 48(H)  Creatinine 0.61 - 1.24 mg/dL 3.59(H) 2.71(H) 2.31(H)  BUN/Creat Ratio 10 - 24 - - -  Sodium 135 - 145 mmol/L 133(L) 138 139  Potassium 3.5 - 5.1 mmol/L 3.9 4.3 3.8  Chloride 98 - 111 mmol/L 106 105 107  CO2 22 - 32 mmol/L 19(L) 27 24  Calcium 8.9 - 10.3 mg/dL 8.7(L) 9.1 9.5    Hepatic Function Latest Ref Rng & Units 11/05/2020 09/11/2020 04/17/2020  Total Protein 6.5 - 8.1 g/dL 6.7 7.3 6.3  Albumin 3.5 - 5.0 g/dL 3.3(L) 3.7 3.8  AST 15 - 41 U/L _0 ALT 0 - 44 U/L _1 Alk Phosphatase 38 - 126 U/L 77 93 84  Total Bilirubin 0.3 - 1.2 mg/dL 0.6 0.7 0.5  Bilirubin, Direct 0.0 - 0.3 mg/dL - 0.2 0.1    Lab Results  Component Value Date   CHOL 132 04/08/2018   Lab Results  Component Value Date   HDL 68.50 04/08/2018   Lab Results  Component Value Date   LDLCALC 53 04/08/2018   Lab Results  Component Value Date   TRIG 50.0 04/08/2018   Lab Results  Component Value Date   CHOLHDL 2 04/08/2018   No results for input(s): PSA in the last 72 hours. No results found for: HCVAB Lab Results  Component Value Date   VD25OH 53.07 04/17/2020   VD25OH 50.75 04/12/2019   VD25OH 55.51 04/08/2018     Lab Results  Component Value Date   HGBA1C 6.2 07/20/2008   Lab Results  Component Value Date   LDLCALC 53 04/08/2018    CREATININE 3.59 (H) 11/05/2020     Assessment and Plan:     ICD-10-CM   1. Acute renal failure, unspecified acute renal failure type ([place])  N17.9 CBC with Differential/Platelet    Basic metabolic panel    Hepatic function panel    2. Melena  K92.1 CBC with Differential/Platelet    Basic metabolic panel    Hepatic function panel    3. Elevated INR  R79.1     4. Multiple rib fractures involving four or more ribs  S22.49XA     5. Traumatic fracture of ribs with pneumothorax, left, closed, initial encounter  S22.42XA    S27.0XXA     6. Acute dehydration  E86.0      Total encounter time: 40 minutes. This includes total time spent on the day of encounter.  Complicated case.  1.  Acute renal failure.  The patient was discharged from the ER with a creatinine of 3.6 and a BUN of 80 from the emergency room up from a baseline of 2.3.  He has also been dehydrated and he has not eating or drinking much.  The patient and his daughter and I had a very long conversation about kidney disease and kidney failure.  He has been stable up until somewhat recently with acute renal failure in the ER 11/04/2020.  If his creatinine drops down to approaching normal, then I think that this will be okay just to follow.  If it remains elevated as above, we discussed a number of different options.  I think that the most appropriate thing to do in a 87 year old who is 100% with it mentally would be to have him go to the ER for urgent evaluation, fluids, and an attempt to get his renal function to return.  It is even a more challenging thing if he ultimately goes on to complete renal failure.  I discussed  all this with the patient and his daughter as well.  Historically when he has had chronic kidney disease in his 38s, the patient had been wanting to limit specialist visits if possible, and while it had been stable I think that this was probably reasonable plan.  2.  Multiple rib fractures, left sided anterolateral with  pain that he describes as 7 out of 10 constantly.  I gave him plenty of tramadol to use.  There are risks and benefits to this, but right now he is in significant pain and at 87 years old.  Primary risk as follows.  3. Small left-sided apical pneumothorax, felt to be stable for discharge by cardiothoracic surgery and discharged by ER on the above date.  Cardiothoracic surgery recommended 1 month follow-up.  I honestly think that the bigger issue now or his multiple other medical problems.  4.  Melanotic stool.  Chronic anemia.  Assess for CBC stability.  5.  Elevated INR greater than 6 in the ER.  Greater than 7 on recheck at cardiac Coumadin clinic.  Thankfully, this is now returned to a more normal level.  CBC to assess.  6.  Weight loss and decreased diet.  Offer any kind of food that he would like.  Trial of Remeron at night to see if this will offer benefit.  Meds ordered this encounter  Medications   traMADol (ULTRAM) 50 MG tablet    Sig: Take 1 tablet (50 mg total) by mouth every 6 (six) hours as needed for moderate pain.    Dispense:  120 tablet    Refill:  1    Severe post-traumatic pain with 5 rib fractures and small pneumothorax   mirtazapine (REMERON) 15 MG tablet    Sig: Take 1 tablet (15 mg total) by mouth at bedtime.    Dispense:  30 tablet    Refill:  3   Medications Discontinued During This Encounter  Medication Reason   traMADol (ULTRAM) 50 MG tablet Reorder   Multiple Vitamin (MULTIVITAMIN) tablet    ferrous sulfate 325 (65 FE) MG EC tablet    Orders Placed This Encounter  Procedures   CBC with Differential/Platelet   Basic metabolic panel   Hepatic function panel    Follow-up: Return in about 1 month (around 12/21/2020).  [employer] One speech-to-text software was used for transcription in this dictation.  Possible transcriptional errors can occur using Editor, commissioning.   Signed,  [name]. Ran Tullis, MD   Outpatient Encounter Medications as of  11/20/2020  Medication Sig   acetaminophen (TYLENOL) 500 MG tablet Take 500 mg by mouth every 6 (six) hours as needed for moderate pain or headache.   alendronate (FOSAMAX) 70 MG tablet TAKE 1 TABLET BY MOUTH EVERY 7 DAYS WITH A FULL GLASS OF WATER AND ON AN EMPTY STOMACH   amiodarone (PACERONE) 200 MG tablet TAKE 1/2 TABLET(100 MG) BY MOUTH DAILY   cyanocobalamin (,VITAMIN B-12,) 1000 MCG/ML injection Inject 1,000 mcg into the muscle every 30 (thirty) days.   docusate sodium (COLACE) 50 MG capsule Take 50 mg by mouth 2 (two) times daily as needed for mild constipation.   dorzolamide-timolol (COSOPT) 22.3-6.8 MG/ML ophthalmic solution Place 1 drop into both eyes 2 (two) times daily.   furosemide (LASIX) 20 MG tablet Take 1 tablet (20 mg total) by mouth daily.   levothyroxine (SYNTHROID) 100 MCG tablet TAKE 1 TABLET BY MOUTH DAILY BEFORE BREAKFAST   loperamide (IMODIUM) 2 MG capsule Take 2 mg by mouth as  needed for diarrhea or loose stools.   mirtazapine (REMERON) 15 MG tablet Take 1 tablet (15 mg total) by mouth at bedtime.   pantoprazole (PROTONIX) 40 MG tablet TAKE 1 TABLET(40 MG) BY MOUTH DAILY   polyethylene glycol (MIRALAX / GLYCOLAX) packet Take 17 g by mouth daily as needed for moderate constipation.    pravastatin (PRAVACHOL) 40 MG tablet TAKE 1/2 TABLET(20 MG) BY MOUTH AT BEDTIME   simethicone (MYLICON) 612 MG chewable tablet Chew 125 mg by mouth every 6 (six) hours as needed for flatulence.   tamsulosin (FLOMAX) 0.4 MG CAPS capsule TAKE 1 CAPSULE BY MOUTH DAILY   warfarin (COUMADIN) 1 MG tablet TAKE 1 TO 1 AND 1/2 TABLETS BY MOUTH DAILY AS DIRECTED BY COUMADIN CLINIC   Wheat Dextrin (BENEFIBER DRINK MIX PO) Take 1 scoop by mouth daily.   [DISCONTINUED] traMADol (ULTRAM) 50 MG tablet Take 0.5-1 tablets (25-50 mg total) by mouth every 8 (eight) hours as needed for moderate pain. (Patient taking differently: Take 50 mg by mouth every 12 (twelve) hours as needed for moderate pain.)    traMADol (ULTRAM) 50 MG tablet Take 1 tablet (50 mg total) by mouth every 6 (six) hours as needed for moderate pain.   [DISCONTINUED] ferrous sulfate 325 (65 FE) MG EC tablet Take 325 mg by mouth daily with breakfast. (Patient not taking: Reported on 11/20/2020)   [DISCONTINUED] Multiple Vitamin (MULTIVITAMIN) tablet Take 1 tablet by mouth daily. (Patient not taking: Reported on 11/20/2020)   No facility-administered encounter medications on file as of 11/20/2020.

## 2020-11-20 NOTE — Telephone Encounter (Signed)
Attempted to reach patient and daughter for update on his condition after his fall.  Surgery on 11/05/20 was cancelled due to small pneumothorax, pleural effusion, elevated INR as well as rib fractures. It was recommended that patient follow up with PCP and cardio, possibly pulmonology for clearance prior to surgery. Rib fractures will also need time to heal from an anesthesia stand point.  Left a message on patient's home number to call back with an update and discuss possible future surgery date

## 2020-11-21 ENCOUNTER — Other Ambulatory Visit: Payer: Self-pay

## 2020-11-21 ENCOUNTER — Emergency Department: Payer: Medicare Other

## 2020-11-21 ENCOUNTER — Telehealth: Payer: Self-pay | Admitting: Radiology

## 2020-11-21 ENCOUNTER — Inpatient Hospital Stay
Admission: EM | Admit: 2020-11-21 | Discharge: 2020-11-29 | DRG: 682 | Disposition: A | Discharge status: Skilled Nursing Facility | Payer: Medicare Other | Attending: Student | Admitting: Student

## 2020-11-21 DIAGNOSIS — Z7901 Long term (current) use of anticoagulants: Secondary | ICD-10-CM | POA: Diagnosis not present

## 2020-11-21 DIAGNOSIS — Z79899 Other long term (current) drug therapy: Secondary | ICD-10-CM

## 2020-11-21 DIAGNOSIS — E872 Acidosis: Secondary | ICD-10-CM | POA: Diagnosis present

## 2020-11-21 DIAGNOSIS — Z6820 Body mass index (BMI) 20.0-20.9, adult: Secondary | ICD-10-CM | POA: Diagnosis not present

## 2020-11-21 DIAGNOSIS — Z85828 Personal history of other malignant neoplasm of skin: Secondary | ICD-10-CM

## 2020-11-21 DIAGNOSIS — I130 Hypertensive heart and chronic kidney disease with heart failure and stage 1 through stage 4 chronic kidney disease, or unspecified chronic kidney disease: Secondary | ICD-10-CM | POA: Diagnosis not present

## 2020-11-21 DIAGNOSIS — Z950 Presence of cardiac pacemaker: Secondary | ICD-10-CM | POA: Diagnosis present

## 2020-11-21 DIAGNOSIS — S2242XD Multiple fractures of ribs, left side, subsequent encounter for fracture with routine healing: Secondary | ICD-10-CM

## 2020-11-21 DIAGNOSIS — Z66 Do not resuscitate: Secondary | ICD-10-CM | POA: Diagnosis not present

## 2020-11-21 DIAGNOSIS — N184 Chronic kidney disease, stage 4 (severe): Secondary | ICD-10-CM | POA: Diagnosis present

## 2020-11-21 DIAGNOSIS — H409 Unspecified glaucoma: Secondary | ICD-10-CM | POA: Diagnosis present

## 2020-11-21 DIAGNOSIS — R296 Repeated falls: Secondary | ICD-10-CM | POA: Diagnosis present

## 2020-11-21 DIAGNOSIS — M7989 Other specified soft tissue disorders: Secondary | ICD-10-CM

## 2020-11-21 DIAGNOSIS — N139 Obstructive and reflux uropathy, unspecified: Secondary | ICD-10-CM | POA: Diagnosis not present

## 2020-11-21 DIAGNOSIS — Z8546 Personal history of malignant neoplasm of prostate: Secondary | ICD-10-CM

## 2020-11-21 DIAGNOSIS — E785 Hyperlipidemia, unspecified: Secondary | ICD-10-CM | POA: Diagnosis present

## 2020-11-21 DIAGNOSIS — E039 Hypothyroidism, unspecified: Secondary | ICD-10-CM | POA: Diagnosis not present

## 2020-11-21 DIAGNOSIS — Z79890 Hormone replacement therapy (postmenopausal): Secondary | ICD-10-CM

## 2020-11-21 DIAGNOSIS — E43 Unspecified severe protein-calorie malnutrition: Secondary | ICD-10-CM | POA: Insufficient documentation

## 2020-11-21 DIAGNOSIS — I4811 Longstanding persistent atrial fibrillation: Secondary | ICD-10-CM | POA: Diagnosis present

## 2020-11-21 DIAGNOSIS — Z923 Personal history of irradiation: Secondary | ICD-10-CM

## 2020-11-21 DIAGNOSIS — R791 Abnormal coagulation profile: Secondary | ICD-10-CM | POA: Diagnosis present

## 2020-11-21 DIAGNOSIS — K219 Gastro-esophageal reflux disease without esophagitis: Secondary | ICD-10-CM | POA: Diagnosis present

## 2020-11-21 DIAGNOSIS — I82A12 Acute embolism and thrombosis of left axillary vein: Secondary | ICD-10-CM | POA: Diagnosis not present

## 2020-11-21 DIAGNOSIS — E611 Iron deficiency: Secondary | ICD-10-CM | POA: Diagnosis present

## 2020-11-21 DIAGNOSIS — I700 Atherosclerosis of aorta: Secondary | ICD-10-CM | POA: Diagnosis present

## 2020-11-21 DIAGNOSIS — Z20822 Contact with and (suspected) exposure to covid-19: Secondary | ICD-10-CM | POA: Diagnosis not present

## 2020-11-21 DIAGNOSIS — Z7983 Long term (current) use of bisphosphonates: Secondary | ICD-10-CM

## 2020-11-21 DIAGNOSIS — I5032 Chronic diastolic (congestive) heart failure: Secondary | ICD-10-CM | POA: Diagnosis not present

## 2020-11-21 DIAGNOSIS — I959 Hypotension, unspecified: Secondary | ICD-10-CM | POA: Diagnosis not present

## 2020-11-21 DIAGNOSIS — G929 Unspecified toxic encephalopathy: Secondary | ICD-10-CM | POA: Diagnosis not present

## 2020-11-21 DIAGNOSIS — Z8249 Family history of ischemic heart disease and other diseases of the circulatory system: Secondary | ICD-10-CM

## 2020-11-21 DIAGNOSIS — R0781 Pleurodynia: Secondary | ICD-10-CM | POA: Diagnosis not present

## 2020-11-21 DIAGNOSIS — N19 Unspecified kidney failure: Secondary | ICD-10-CM | POA: Diagnosis not present

## 2020-11-21 DIAGNOSIS — I10 Essential (primary) hypertension: Secondary | ICD-10-CM | POA: Diagnosis not present

## 2020-11-21 DIAGNOSIS — R531 Weakness: Secondary | ICD-10-CM

## 2020-11-21 DIAGNOSIS — E875 Hyperkalemia: Secondary | ICD-10-CM | POA: Diagnosis present

## 2020-11-21 DIAGNOSIS — N281 Cyst of kidney, acquired: Secondary | ICD-10-CM | POA: Diagnosis not present

## 2020-11-21 DIAGNOSIS — J90 Pleural effusion, not elsewhere classified: Secondary | ICD-10-CM | POA: Diagnosis not present

## 2020-11-21 DIAGNOSIS — R319 Hematuria, unspecified: Secondary | ICD-10-CM | POA: Diagnosis present

## 2020-11-21 DIAGNOSIS — N35912 Unspecified bulbous urethral stricture, male: Secondary | ICD-10-CM | POA: Diagnosis not present

## 2020-11-21 DIAGNOSIS — Z880 Allergy status to penicillin: Secondary | ICD-10-CM

## 2020-11-21 DIAGNOSIS — I82622 Acute embolism and thrombosis of deep veins of left upper extremity: Secondary | ICD-10-CM | POA: Diagnosis not present

## 2020-11-21 DIAGNOSIS — C61 Malignant neoplasm of prostate: Secondary | ICD-10-CM | POA: Diagnosis present

## 2020-11-21 DIAGNOSIS — R3911 Hesitancy of micturition: Secondary | ICD-10-CM | POA: Diagnosis present

## 2020-11-21 DIAGNOSIS — R600 Localized edema: Secondary | ICD-10-CM | POA: Diagnosis not present

## 2020-11-21 DIAGNOSIS — N179 Acute kidney failure, unspecified: Secondary | ICD-10-CM | POA: Diagnosis not present

## 2020-11-21 DIAGNOSIS — N35919 Unspecified urethral stricture, male, unspecified site: Secondary | ICD-10-CM | POA: Diagnosis not present

## 2020-11-21 DIAGNOSIS — E876 Hypokalemia: Secondary | ICD-10-CM | POA: Diagnosis not present

## 2020-11-21 DIAGNOSIS — M6281 Muscle weakness (generalized): Secondary | ICD-10-CM | POA: Diagnosis not present

## 2020-11-21 LAB — URINALYSIS, COMPLETE (UACMP) WITH MICROSCOPIC
Bilirubin Urine: NEGATIVE
Glucose, UA: NEGATIVE mg/dL
Ketones, ur: NEGATIVE mg/dL
Nitrite: NEGATIVE
Protein, ur: 30 mg/dL — AB
Specific Gravity, Urine: 1.009 (ref 1.005–1.030)
WBC, UA: >50 WBC/hpf — ABNORMAL HIGH (ref 0–5)
pH: 6 (ref 5.0–8.0)

## 2020-11-21 LAB — COMPREHENSIVE METABOLIC PANEL
ALT: 16 U/L (ref 0–44)
AST: 18 U/L (ref 15–41)
Albumin: 3.3 g/dL — ABNORMAL LOW (ref 3.5–5.0)
Alkaline Phosphatase: 99 U/L (ref 38–126)
Anion gap: 12 (ref 5–15)
BUN: 96 mg/dL — ABNORMAL HIGH (ref 8–23)
CO2: 13 mmol/L — ABNORMAL LOW (ref 22–32)
Calcium: 8.9 mg/dL (ref 8.9–10.3)
Chloride: 112 mmol/L — ABNORMAL HIGH (ref 98–111)
Creatinine, Ser: 5.05 mg/dL — ABNORMAL HIGH (ref 0.61–1.24)
GFR, Estimated: 10 mL/min — ABNORMAL LOW (ref >60)
Glucose, Bld: 131 mg/dL — ABNORMAL HIGH (ref 70–99)
Potassium: 5.3 mmol/L — ABNORMAL HIGH (ref 3.5–5.1)
Sodium: 137 mmol/L (ref 135–145)
Total Bilirubin: 0.8 mg/dL (ref 0.3–1.2)
Total Protein: 7 g/dL (ref 6.5–8.1)

## 2020-11-21 LAB — HEPATIC FUNCTION PANEL
ALT: 14 U/L (ref 0–53)
AST: 17 U/L (ref 0–37)
Albumin: 3.5 g/dL (ref 3.5–5.2)
Alkaline Phosphatase: 108 U/L (ref 39–117)
Bilirubin, Direct: 0.1 mg/dL (ref 0.0–0.3)
Total Bilirubin: 0.5 mg/dL (ref 0.2–1.2)
Total Protein: 7 g/dL (ref 6.0–8.3)

## 2020-11-21 LAB — CBC WITH DIFFERENTIAL/PLATELET
Abs Immature Granulocytes: 0.02 10*3/uL (ref 0.00–0.07)
Basophils Absolute: 0.1 10*3/uL (ref 0.0–0.1)
Basophils Absolute: 0 10*3/uL (ref 0.0–0.1)
Basophils Relative: 1.5 % (ref 0.0–3.0)
Basophils Relative: 1 %
Eosinophils Absolute: 0 10*3/uL (ref 0.0–0.7)
Eosinophils Absolute: 0.1 10*3/uL (ref 0.0–0.5)
Eosinophils Relative: 0.7 % (ref 0.0–5.0)
Eosinophils Relative: 2 %
HCT: 33.7 % — ABNORMAL LOW (ref 39.0–52.0)
HCT: 34.8 % — ABNORMAL LOW (ref 39.0–52.0)
Hemoglobin: 10.8 g/dL — ABNORMAL LOW (ref 13.0–17.0)
Hemoglobin: 10.9 g/dL — ABNORMAL LOW (ref 13.0–17.0)
Immature Granulocytes: 0 %
Lymphocytes Relative: 14.6 % (ref 12.0–46.0)
Lymphocytes Relative: 17 %
Lymphs Abs: 0.9 10*3/uL (ref 0.7–4.0)
Lymphs Abs: 1.1 10*3/uL (ref 0.7–4.0)
MCH: 32.1 pg (ref 26.0–34.0)
MCHC: 32 g/dL (ref 30.0–36.0)
MCHC: 31.3 g/dL (ref 30.0–36.0)
MCV: 100.1 fl — ABNORMAL HIGH (ref 78.0–100.0)
MCV: 102.4 fL — ABNORMAL HIGH (ref 80.0–100.0)
Monocytes Absolute: 0.6 10*3/uL (ref 0.1–1.0)
Monocytes Absolute: 0.6 10*3/uL (ref 0.1–1.0)
Monocytes Relative: 9.6 % (ref 3.0–12.0)
Monocytes Relative: 10 %
Neutro Abs: 4.6 10*3/uL (ref 1.4–7.7)
Neutro Abs: 4.7 10*3/uL (ref 1.7–7.7)
Neutrophils Relative %: 73.6 % (ref 43.0–77.0)
Neutrophils Relative %: 70 %
Platelets: 238 10*3/uL (ref 150.0–400.0)
Platelets: 237 10*3/uL (ref 150–400)
RBC: 3.36 Mil/uL — ABNORMAL LOW (ref 4.22–5.81)
RBC: 3.4 MIL/uL — ABNORMAL LOW (ref 4.22–5.81)
RDW: 16 % — ABNORMAL HIGH (ref 11.5–15.5)
RDW: 15.8 % — ABNORMAL HIGH (ref 11.5–15.5)
WBC: 6.3 10*3/uL (ref 4.0–10.5)
WBC: 6.6 10*3/uL (ref 4.0–10.5)
nRBC: 0 % (ref 0.0–0.2)

## 2020-11-21 LAB — RESP PANEL BY RT-PCR (FLU A&B, COVID) ARPGX2
Influenza A by PCR: NEGATIVE
Influenza B by PCR: NEGATIVE
SARS Coronavirus 2 by RT PCR: NEGATIVE

## 2020-11-21 LAB — PROTIME-INR
INR: 2.8 — ABNORMAL HIGH (ref 0.8–1.2)
Prothrombin Time: 29.9 seconds — ABNORMAL HIGH (ref 11.4–15.2)

## 2020-11-21 LAB — BASIC METABOLIC PANEL
BUN: 93 mg/dL (ref 6–23)
CO2: 15 mEq/L — ABNORMAL LOW (ref 19–32)
Calcium: 8.8 mg/dL (ref 8.4–10.5)
Chloride: 109 mEq/L (ref 96–112)
Creatinine, Ser: 4.76 mg/dL (ref 0.40–1.50)
GFR: 9.83 mL/min — CL (ref >60.00)
Glucose, Bld: 123 mg/dL — ABNORMAL HIGH (ref 70–99)
Potassium: 4.4 mEq/L (ref 3.5–5.1)
Sodium: 137 mEq/L (ref 135–145)

## 2020-11-21 MED ORDER — CAMPHOR-MENTHOL 0.5-0.5 % EX LOTN
1.0000 "application " | TOPICAL_LOTION | Freq: Three times a day (TID) | CUTANEOUS | Status: DC | PRN
  Filled 2020-11-21: qty 222

## 2020-11-21 MED ORDER — TAMSULOSIN HCL 0.4 MG PO CAPS
0.4000 mg | ORAL_CAPSULE | Freq: Every day | ORAL | Status: DC
  Administered 2020-11-22 – 2020-11-29 (×7): 0.4 mg via ORAL
  Filled 2020-11-21 (×8): qty 1

## 2020-11-21 MED ORDER — SIMETHICONE 80 MG PO CHEW
80.0000 mg | CHEWABLE_TABLET | Freq: Four times a day (QID) | ORAL | Status: DC
  Administered 2020-11-22 – 2020-11-29 (×28): 80 mg via ORAL
  Filled 2020-11-21 (×29): qty 1

## 2020-11-21 MED ORDER — TRAMADOL HCL 50 MG PO TABS: 50.0000 mg | ORAL_TABLET | Freq: Four times a day (QID) | ORAL | Status: DC | PRN

## 2020-11-21 MED ORDER — ZOLPIDEM TARTRATE 5 MG PO TABS
5.0000 mg | ORAL_TABLET | Freq: Every evening | ORAL | Status: DC | PRN
  Administered 2020-11-26: 5 mg via ORAL
  Filled 2020-11-21: qty 1

## 2020-11-21 MED ORDER — CYANOCOBALAMIN 1000 MCG/ML IJ SOLN
1000.0000 ug | INTRAMUSCULAR | Status: DC
  Administered 2020-11-22: 1000 ug via INTRAMUSCULAR
  Filled 2020-11-21: qty 1

## 2020-11-21 MED ORDER — SORBITOL 70 % SOLN
30.0000 mL | Status: DC | PRN
  Filled 2020-11-21: qty 30

## 2020-11-21 MED ORDER — PANTOPRAZOLE SODIUM 40 MG PO TBEC
40.0000 mg | DELAYED_RELEASE_TABLET | Freq: Every day | ORAL | Status: DC
  Administered 2020-11-22 – 2020-11-29 (×7): 40 mg via ORAL
  Filled 2020-11-21 (×8): qty 1

## 2020-11-21 MED ORDER — STERILE WATER FOR INJECTION IV SOLN
INTRAVENOUS | Status: DC
  Filled 2020-11-21 (×4): qty 1000
  Filled 2020-11-21: qty 150
  Filled 2020-11-21 (×3): qty 1000

## 2020-11-21 MED ORDER — WARFARIN - PHARMACIST DOSING INPATIENT
Freq: Every day | Status: DC
  Filled 2020-11-21: qty 1

## 2020-11-21 MED ORDER — LOPERAMIDE HCL 2 MG PO CAPS: 2.0000 mg | ORAL_CAPSULE | ORAL | Status: DC | PRN

## 2020-11-21 MED ORDER — SODIUM CHLORIDE 0.45 % IV SOLN
INTRAVENOUS | Status: DC
  Filled 2020-11-21 (×3): qty 75

## 2020-11-21 MED ORDER — WARFARIN 0.5 MG HALF TABLET
0.5000 mg | ORAL_TABLET | Freq: Once | ORAL | Status: AC
  Administered 2020-11-21: 0.5 mg via ORAL
  Filled 2020-11-21: qty 1

## 2020-11-21 MED ORDER — NEPRO/CARBSTEADY PO LIQD: 237.0000 mL | Freq: Three times a day (TID) | ORAL | Status: DC | PRN

## 2020-11-21 MED ORDER — ACETAMINOPHEN 650 MG RE SUPP: 650.0000 mg | Freq: Four times a day (QID) | RECTAL | Status: DC | PRN

## 2020-11-21 MED ORDER — WARFARIN SODIUM 1 MG PO TABS
1.0000 mg | ORAL_TABLET | ORAL | Status: DC
  Filled 2020-11-21: qty 1

## 2020-11-21 MED ORDER — DOCUSATE SODIUM 283 MG RE ENEM
1.0000 | ENEMA | RECTAL | Status: DC | PRN
  Filled 2020-11-21: qty 1

## 2020-11-21 MED ORDER — DORZOLAMIDE HCL-TIMOLOL MAL 2-0.5 % OP SOLN
1.0000 [drp] | Freq: Two times a day (BID) | OPHTHALMIC | Status: DC
  Administered 2020-11-23 – 2020-11-29 (×14): 1 [drp] via OPHTHALMIC
  Filled 2020-11-21 (×2): qty 10

## 2020-11-21 MED ORDER — LEVOTHYROXINE SODIUM 100 MCG PO TABS
100.0000 ug | ORAL_TABLET | Freq: Every day | ORAL | Status: DC
  Administered 2020-11-22 – 2020-11-29 (×6): 100 ug via ORAL
  Filled 2020-11-21 (×6): qty 1

## 2020-11-21 MED ORDER — LACTATED RINGERS IV BOLUS
1000.0000 mL | Freq: Once | INTRAVENOUS | Status: AC
  Administered 2020-11-21: 1000 mL via INTRAVENOUS

## 2020-11-21 MED ORDER — ONDANSETRON HCL 4 MG/2ML IJ SOLN
4.0000 mg | Freq: Four times a day (QID) | INTRAMUSCULAR | Status: DC | PRN
  Filled 2020-11-21: qty 2

## 2020-11-21 MED ORDER — AMIODARONE HCL 200 MG PO TABS
100.0000 mg | ORAL_TABLET | Freq: Every evening | ORAL | Status: DC
  Administered 2020-11-22 – 2020-11-28 (×7): 100 mg via ORAL
  Filled 2020-11-21 (×7): qty 1

## 2020-11-21 MED ORDER — PRAVASTATIN SODIUM 20 MG PO TABS
20.0000 mg | ORAL_TABLET | Freq: Every day | ORAL | Status: DC
  Administered 2020-11-22 – 2020-11-28 (×7): 20 mg via ORAL
  Filled 2020-11-21 (×7): qty 1

## 2020-11-21 MED ORDER — POLYETHYLENE GLYCOL 3350 17 G PO PACK: 17.0000 g | PACK | Freq: Every day | ORAL | Status: DC | PRN

## 2020-11-21 MED ORDER — HYDROXYZINE HCL 25 MG PO TABS: 25.0000 mg | ORAL_TABLET | Freq: Three times a day (TID) | ORAL | Status: DC | PRN

## 2020-11-21 MED ORDER — CALCIUM CARBONATE ANTACID 500 MG PO CHEW: 500.0000 mg | CHEWABLE_TABLET | Freq: Four times a day (QID) | ORAL | Status: DC | PRN

## 2020-11-21 MED ORDER — DOCUSATE SODIUM 50 MG PO CAPS
50.0000 mg | ORAL_CAPSULE | Freq: Two times a day (BID) | ORAL | Status: DC | PRN
  Filled 2020-11-21: qty 1

## 2020-11-21 MED ORDER — ONDANSETRON HCL 4 MG PO TABS: 4.0000 mg | ORAL_TABLET | Freq: Four times a day (QID) | ORAL | Status: DC | PRN

## 2020-11-21 MED ORDER — MIRTAZAPINE 15 MG PO TABS
15.0000 mg | ORAL_TABLET | Freq: Every day | ORAL | Status: DC
  Administered 2020-11-21 – 2020-11-28 (×8): 15 mg via ORAL
  Filled 2020-11-21 (×8): qty 1

## 2020-11-21 MED ORDER — ACETAMINOPHEN 325 MG PO TABS
650.0000 mg | ORAL_TABLET | Freq: Four times a day (QID) | ORAL | Status: DC | PRN
  Administered 2020-11-22 – 2020-11-27 (×3): 650 mg via ORAL
  Filled 2020-11-21 (×3): qty 2

## 2020-11-21 NOTE — ED Notes (Signed)
Informed RN bed assigned 

## 2020-11-21 NOTE — ED Notes (Signed)
US at bedside

## 2020-11-21 NOTE — Consult Note (Signed)
[facility]  CONSULT NOTE    Date: 11/21/2020                  Patient Name:  [name] [name]  MRN: [id number]  DOB: Sep 26, 1922  Age / Sex: 87 y.o., male         PCP: Owens Loffler, MD                 Service Requesting Consult: Dr. Charna Archer                 Reason for Consult: Acute kidney injury            History of Present Illness: [name] presents to the ED after having elevated creatinine and BUN at his PCP's office. Patient is accompanied by his daughter who assists with history taking. Patient has not been eating well for several weeks. Continues to take his medications. He noticed that he is not urinating as frequently.   Patient was scheduled for a cystoscopy with urethral dilation on 8/9. But he fell and fractured his ribs and found to have a small pneumothorax. He was then asked to reschedule.   Patient feels weak and tired. Unable to do much in last two weeks.   Patient's INR was elevated on 8/9 at 6.3. Today's INR is 2.8     Medications: Outpatient medications: (Not in a hospital admission)   Current medications: Current Facility-Administered Medications  Medication Dose Route Frequency Provider Last Rate Last Admin   sodium bicarbonate 75 mEq in sodium chloride 0.45 % 1,075 mL infusion   Intravenous Continuous Blake Divine, MD       Current Outpatient Medications  Medication Sig Dispense Refill   acetaminophen (TYLENOL) 500 MG tablet Take 500 mg by mouth every 6 (six) hours as needed for moderate pain or headache.     alendronate (FOSAMAX) 70 MG tablet TAKE 1 TABLET BY MOUTH EVERY 7 DAYS WITH A FULL GLASS OF WATER AND ON AN EMPTY STOMACH 12 tablet 3   amiodarone (PACERONE) 200 MG tablet TAKE 1/2 TABLET(100 MG) BY MOUTH DAILY 45 tablet 3   cyanocobalamin (,VITAMIN B-12,) 1000 MCG/ML injection Inject 1,000 mcg into the muscle every 30 (thirty) days.     docusate sodium (COLACE) 50 MG capsule Take 50 mg by mouth 2 (two)  times daily as needed for mild constipation.     dorzolamide-timolol (COSOPT) 22.3-6.8 MG/ML ophthalmic solution Place 1 drop into both eyes 2 (two) times daily.     furosemide (LASIX) 20 MG tablet Take 1 tablet (20 mg total) by mouth daily. 90 tablet 1   levothyroxine (SYNTHROID) 100 MCG tablet TAKE 1 TABLET BY MOUTH DAILY BEFORE BREAKFAST 90 tablet 3   loperamide (IMODIUM) 2 MG capsule Take 2 mg by mouth as needed for diarrhea or loose stools.     mirtazapine (REMERON) 15 MG tablet Take 1 tablet (15 mg total) by mouth at bedtime. 30 tablet 3   pantoprazole (PROTONIX) 40 MG tablet TAKE 1 TABLET(40 MG) BY MOUTH DAILY 90 tablet 3   polyethylene glycol (MIRALAX / GLYCOLAX) packet Take 17 g by mouth daily as needed for moderate constipation.      pravastatin (PRAVACHOL) 40 MG tablet TAKE 1/2 TABLET(20 MG) BY MOUTH AT BEDTIME 45 tablet 3   simethicone (MYLICON) 578 MG chewable tablet Chew 125 mg by mouth every 6 (six) hours as needed for flatulence.     tamsulosin (FLOMAX) 0.4 MG CAPS capsule TAKE 1  CAPSULE BY MOUTH DAILY 90 capsule 3   traMADol (ULTRAM) 50 MG tablet Take 1 tablet (50 mg total) by mouth every 6 (six) hours as needed for moderate pain. 120 tablet 1   warfarin (COUMADIN) 1 MG tablet TAKE 1 TO 1 AND 1/2 TABLETS BY MOUTH DAILY AS DIRECTED BY COUMADIN CLINIC 45 tablet 2   Wheat Dextrin (BENEFIBER DRINK MIX PO) Take 1 scoop by mouth daily.        Allergies: Allergies  Allergen Reactions   Penicillins Rash and Other (See Comments)    Has patient had a PCN reaction causing immediate rash, facial/tongue/throat swelling, SOB or lightheadedness with hypotension: {no Has patient had a PCN reaction causing severe rash involving mucus membranes or skin necrosis: {no Has patient had a PCN reaction that required hospitalization {no Has patient had a PCN reaction occurring within the last 10 years: {no If all of the above answers are "NO", then may proceed with Cephalosporin use.      Past  Medical History: Past Medical History:  Diagnosis Date   Anemia    Aortic atherosclerosis ([name])    Balance problems    uses walker   Cardiac pacemaker in situ 07/2005   symptomatic bradycardia   CKD (chronic kidney disease) stage 3, GFR 30-59 ml/min ([name]) 04/13/2017   Closed right hip fracture, initial encounter ([place]) 02/14/2019   Diverticulosis of colon (without mention of hemorrhage)    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    GERD (gastroesophageal reflux disease)    Glaucoma    Hyperlipidemia    Hypertension    Neck arthritis, Severe, multi-level 06/09/2013   Paroxysmal atrial fibrillation ([name])    s/p failed ablation   Prostate cancer ([place]) 12/2000   5 weeks XRT + brachytherapy   Radiation proctitis    Sinoatrial node dysfunction ([name])    Skin cancer    Skin cancer of eyelid, left 10/2007   Unspecified glaucoma(365.9)    Unspecified hypothyroidism    Vitamin B12 deficiency      Past Surgical History: Past Surgical History:  Procedure Laterality Date   [place] AND ABLATION  2001, 2003   failure   CATARACT EXTRACTION     CYSTOGRAM N/A 09/21/2018   Procedure: CYSTOGRAM;  Surgeon: Abbie Sons, MD;  Location: [facility];  Service: Urology;  Laterality: N/A;   CYSTOSCOPY WITH URETHRAL DILATATION N/A 01/29/2017   Procedure: CYSTOSCOPY WITH URETHRAL DILATATION;  Surgeon: Abbie Sons, MD;  Location: [facility];  Service: Urology;  Laterality: N/A;   CYSTOSCOPY WITH URETHRAL DILATATION N/A 09/21/2018   Procedure: CYSTOSCOPY WITH URETHRAL DILATATION;  Surgeon: Abbie Sons, MD;  Location: [facility];  Service: Urology;  Laterality: N/A;   CYSTOURETHROSCOPY N/A 11/07/2014   EP IMPLANTABLE DEVICE N/A 02/24/2016   Procedure: PPM Generator Changeout;  Surgeon: Deboraha Sprang, MD;  Location: [place] [facility];  Service: Cardiovascular;  Laterality: N/A;   gastric ulcer surgery     HERNIA REPAIR  1994, 2001,  2003   s/p drainage and complications, 10/2991, 09/1694 mesh removal   HIP ARTHROPLASTY Right 02/16/2019   Procedure: RIGHT ANTERIOR HIP HEMIARTHROPLASTY,CEMENTED;  Surgeon: Hessie Knows, MD;  Location: [facility];  Service: Orthopedics;  Laterality: Right;   INSERT / REPLACE / REMOVE PACEMAKER  07/2005   symptomatic bradycardia   kidney stones     NOSE SURGERY     cancer removed    PROSTATE SURGERY  SKIN CANCER DESTRUCTION     TOOTH EXTRACTION       Family History: Family History  Problem Relation Age of Onset   Breast cancer Mother    Bone cancer Father    Alcohol abuse Other    Coronary artery disease Other    Dementia Other    Diabetes Other      Social History: Social History   Socioeconomic History   Marital status: Widowed    Spouse name: Not on file   Number of children: Not on file   Years of education: Not on file   Highest education level: Not on file  Occupational History   Not on file  Tobacco Use   Smoking status: Never   Smokeless tobacco: Never  Vaping Use   Vaping Use: Never used  Substance and Sexual Activity   Alcohol use: No   Drug use: No   Sexual activity: Yes    Birth control/protection: None  Other Topics Concern   Not on file  Social History Narrative   Not on file   Social Determinants of Health   Financial Resource Strain: Low Risk    Difficulty of Paying Living Expenses: Not hard at all  Food Insecurity: No Food Insecurity   Worried About Charity fundraiser in the Last Year: Never true   [place] in the Last Year: Never true  Transportation Needs: No Transportation Needs   Lack of Transportation (Medical): No   Lack of Transportation (Non-Medical): No  Physical Activity: Inactive   Days of Exercise per Week: 0 days   Minutes of Exercise per Session: 0 min  Stress: No Stress Concern Present   Feeling of Stress : Not at all  Social Connections: Not on file  Intimate Partner Violence: Not At Risk   Fear of Current  or Ex-Partner: No   Emotionally Abused: No   Physically Abused: No   Sexually Abused: No     Review of Systems: Review of Systems  Constitutional:  Positive for malaise/fatigue and weight loss.  HENT: Negative.    Eyes: Negative.   Respiratory:  Negative for cough, hemoptysis, sputum production, shortness of breath and wheezing.   Cardiovascular:  Negative for chest pain, palpitations, orthopnea, claudication, leg swelling and PND.  Gastrointestinal: Negative.   Genitourinary:  Negative for dysuria, flank pain, frequency, hematuria and urgency.  Musculoskeletal:  Negative for back pain, falls, joint pain, myalgias and neck pain.  Skin: Negative.   Neurological: Negative.   Endo/Heme/Allergies: Negative.   Psychiatric/Behavioral: Negative.     Vital Signs: Blood pressure 139/81, pulse 62, temperature 97.8 F (36.6 C), temperature source Oral, resp. rate 12, height 5\' 8"  (1.727 m), weight 56.6 kg, SpO2 99 %.  Weight trends: Filed Weights   11/21/20 1420  Weight: 56.6 kg    Physical Exam: General: NAD,   Head: Normocephalic, atraumatic. Moist oral mucosal membranes  Eyes: Anicteric, PERRL  Neck: Supple, trachea midline  Lungs:  Clear to auscultation  Heart: Regular rate and rhythm  Abdomen:  Soft, nontender,   Extremities:  trace peripheral edema.  Neurologic: Nonfocal, moving all four extremities  Skin: No lesions        Lab results: Basic Metabolic Panel: Recent Labs  Lab 11/20/20 1536 11/21/20 1427  NA 137 137  K 4.4 5.3*  CL 109 112*  CO2 15* 13*  GLUCOSE 123* 131*  BUN 93 Repeated and verified X2.* 96*  CREATININE 4.76 Repeated and verified X2.* 5.05*  CALCIUM  8.8 8.9    Liver Function Tests: Recent Labs  Lab 11/20/20 1536 11/21/20 1427  AST 17 18  ALT 14 16  ALKPHOS 108 99  BILITOT 0.5 0.8  PROT 7.0 7.0  ALBUMIN 3.5 3.3*   No results for input(s): LIPASE, AMYLASE in the last 168 hours. No results for input(s): AMMONIA in the last 168  hours.  CBC: Recent Labs  Lab 11/20/20 1536 11/21/20 1427  WBC 6.3 6.6  NEUTROABS 4.6 4.7  HGB 10.8* 10.9*  HCT 33.7* 34.8*  MCV 100.1* 102.4*  PLT 238.0 237    Cardiac Enzymes: No results for input(s): CKTOTAL, CKMB, CKMBINDEX, TROPONINI in the last 168 hours.  BNP: Invalid input(s): POCBNP  CBG: No results for input(s): GLUCAP in the last 168 hours.  Microbiology: Results for orders placed or performed during the hospital encounter of 11/21/20  Resp Panel by RT-PCR (Flu A&B, Covid) Nasopharyngeal Swab     Status: None   Collection Time: 11/21/20  3:59 PM   Specimen: Nasopharyngeal Swab; Nasopharyngeal(NP) swabs in vial transport medium  Result Value Ref Range Status   SARS Coronavirus 2 by RT PCR NEGATIVE NEGATIVE Final    Comment: (NOTE) SARS-CoV-2 target nucleic acids are NOT DETECTED.  The SARS-CoV-2 RNA is generally detectable in upper respiratory specimens during the acute phase of infection. The lowest concentration of SARS-CoV-2 viral copies this assay can detect is 138 copies/mL. A negative result does not preclude SARS-Cov-2 infection and should not be used as the sole basis for treatment or other patient management decisions. A negative result may occur with  improper specimen collection/handling, submission of specimen other than nasopharyngeal swab, presence of viral mutation(s) within the areas targeted by this assay, and inadequate number of viral copies(<138 copies/mL). A negative result must be combined with clinical observations, patient history, and epidemiological information. The expected result is Negative.  Fact Sheet for Patients:  EntrepreneurPulse.com.au  Fact Sheet for Healthcare Providers:  IncredibleEmployment.be  This test is no t yet approved or cleared by the [place] FDA and  has been authorized for detection and/or diagnosis of SARS-CoV-2 by FDA under an Emergency Use Authorization (EUA).  This EUA will remain  in effect (meaning this test can be used) for the duration of the COVID-19 declaration under Section 564(b)(1) of the Act, 21 U.S.C.section 360bbb-3(b)(1), unless the authorization is terminated  or revoked sooner.       Influenza A by PCR NEGATIVE NEGATIVE Final   Influenza B by PCR NEGATIVE NEGATIVE Final    Comment: (NOTE) The Xpert Xpress SARS-CoV-2/FLU/RSV plus assay is intended as an aid in the diagnosis of influenza from Nasopharyngeal swab specimens and should not be used as a sole basis for treatment. Nasal washings and aspirates are unacceptable for Xpert Xpress SARS-CoV-2/FLU/RSV testing.  Fact Sheet for Patients: EntrepreneurPulse.com.au  Fact Sheet for Healthcare Providers: IncredibleEmployment.be  This test is not yet approved or cleared by the [place] FDA and has been authorized for detection and/or diagnosis of SARS-CoV-2 by FDA under an Emergency Use Authorization (EUA). This EUA will remain in effect (meaning this test can be used) for the duration of the COVID-19 declaration under Section 564(b)(1) of the Act, 21 U.S.C. section 360bbb-3(b)(1), unless the authorization is terminated or revoked.  Performed at [facility], [address] [place], [name] [address]     Coagulation Studies: Recent Labs    11/21/20 1559  LABPROT 29.9*  INR 2.8*    Urinalysis: No results for input(s): COLORURINE, LABSPEC,  PHURINE, GLUCOSEU, HGBUR, BILIRUBINUR, KETONESUR, PROTEINUR, UROBILINOGEN, NITRITE, LEUKOCYTESUR in the last 72 hours.  Invalid input(s): APPERANCEUR    Imaging: DG Chest 2 View  Result Date: 11/21/2020 CLINICAL DATA:  Weakness, left rib pain after fall several weeks ago EXAM: CHEST - 2 VIEW COMPARISON:  02/14/2019 FINDINGS: Frontal and lateral views of the chest are obtained. Dual lead pacer is again noted and stable. The cardiac silhouette is unremarkable. There is chronic  elevation of the left hemidiaphragm. Trace left pleural fluid. No airspace disease or pneumothorax. There are displaced left lateral seventh and eighth rib fractures noted. No other acute bony abnormalities. IMPRESSION: 1. Minimally displaced left lateral seventh and eighth rib fractures. 2. Trace left pleural effusion. Electronically Signed   By: Randa Ngo M.D.   On: 11/21/2020 16:02   US Renal  Result Date: 11/21/2020 CLINICAL DATA:  Renal failure EXAM: RENAL / URINARY TRACT ULTRASOUND COMPLETE COMPARISON:  CT abdomen and pelvis 12/10/2016 FINDINGS: Right Kidney: Renal measurements: 8.6 x 4.7 x 4.6 cm = volume: 96 mL. Small circumscribed cysts are demonstrated in the upper pole measuring up to 1.3 cm maximal diameter. No significant parenchymal thinning or hydronephrosis. Left Kidney: Renal measurements: 9.3 x 5 x 5.1 cm = volume: 123 mL. 2.4 cm diameter cyst in the midpole was also present on prior CT. No significant parenchymal thinning or hydronephrosis. Bladder: There is heterogeneous bladder wall thickening with trabeculation likely indicating chronic outflow obstruction. Echogenic structure in the base of the bladder corresponds to abnormality seen at prior CT. There are surgical clips in the area suggesting this could be postoperative. Alternatively, a chronic calcified stone or polypoid structure could have this appearance. Mild diffuse internal echoes within the bladder suggesting debris. This could represent concentrated urine, hemorrhage, or infection. Correlate with urinalysis. A chronic finding of the echogenic process suggest benign etiology but if stone is suspected, cystoscopy may be useful in further evaluation. Other: None. IMPRESSION: 1. No evidence of hydronephrosis in either kidney. 2. Small bilateral renal cysts. 3. Bladder wall thickening with trabeculation suggesting chronic outflow obstruction. Nonspecific large echogenic structure demonstrated in the base of the bladder which was  present on prior CT from 12/10/2016. Diffuse internal echoes in the urine. Correlate with urinalysis. Consider urology consultation. Electronically Signed   By: Lucienne Capers M.D.   On: 11/21/2020 17:55     Assessment & Plan: Mr. CAYLOR TALLARICO is a 87 y.o. white male with hypertension, atrial fibrillation on warfarin, prostate cancer, hypothyroidism, glaucoma, pacemaker, who was admitted to [facility] on 11/21/2020 for acute renal failure  Acute kidney injury on chronic kidney disease stage IV: baseline creatinine of 2.31, GFR of 23 on 09/11/2020.  Hyperkalemia Metabolic acidosis Obstructive uropathy Hematuria and proteinuria  AKI secondary to prerenal azotemia from poor PO intake, on going diuretic use of furosemide, and obstructive uropathy. Renal ultrasound reviewed.  - Agree with IV bicarbonate infusion - holding furosemide - Recommend urology consultation - No acute indication for dialysis. Patient states he is not interested in long term dialysis.      LOS: 0 Merrilee Ancona 8/25/20226:04 PM

## 2020-11-21 NOTE — ED Notes (Signed)
3 SST, 1 LAV, 1 LT GREEN, 1 BLUE SENT TO LAB

## 2020-11-21 NOTE — ED Notes (Signed)
Nephrology at bedside

## 2020-11-21 NOTE — ED Triage Notes (Signed)
Pt sent here by Dr this am, states acute renal failure, creatinine elevated. Pt had a fall a few weeks ago and c/o pain in his left ribs, daughter states this is consistent with pain from fall. Pt is HOH.

## 2020-11-21 NOTE — ED Notes (Signed)
Sent by Dr. Acute renal failure.

## 2020-11-21 NOTE — ED Provider Notes (Signed)
[facility] Emergency Department Provider Note   ____________________________________________   Event Date/Time   First MD Initiated Contact with Patient 11/21/20 1503     (approximate)  I have reviewed the triage vital signs and the nursing notes.   HISTORY  Chief Complaint Acute Renal Failure    HPI [name] [name] is a 87 y.o. male with possible history of hypertension, hyperlipidemia, CKD, bradycardia status post pacemaker, and atrial fibrillation on Coumadin who presents to the ED for abnormal labs.  Majority of history is obtained from patient's daughter at bedside, who states that patient has been gradually declining over the past couple weeks.  He had multiple falls earlier this month, was seen in the ED and diagnosed with multiple left-sided rib fractures with small pneumothorax.  He was cleared for discharge home by cardiothoracic surgery, has since been following up with his PCP and has been found to have worsening renal function.  Daughter states that he has had poor p.o. intake, continues to feel weak and unsteady on his feet.  He endorses dysuria but has not had any abdominal or flank pain.  He has not had any fevers, cough, shortness of breath, vomiting, or diarrhea.  He does endorse ongoing pain in the area of his left lateral chest wall.        Past Medical History:  Diagnosis Date   Anemia    Aortic atherosclerosis ([place])    Balance problems    uses walker   Cardiac pacemaker in situ 07/2005   symptomatic bradycardia   CKD (chronic kidney disease) stage 3, GFR 30-59 ml/min ([name]) 04/13/2017   Closed right hip fracture, initial encounter ([place]) 02/14/2019   Diverticulosis of colon (without mention of hemorrhage)    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    GERD (gastroesophageal reflux disease)    Glaucoma    Hyperlipidemia    Hypertension    Neck arthritis, Severe, multi-level 06/09/2013   Paroxysmal atrial fibrillation ([name])     s/p failed ablation   Prostate cancer ([place]) 12/2000   5 weeks XRT + brachytherapy   Radiation proctitis    Sinoatrial node dysfunction ([name])    Skin cancer    Skin cancer of eyelid, left 10/2007   Unspecified glaucoma(365.9)    Unspecified hypothyroidism    Vitamin B12 deficiency     Patient Active Problem List   Diagnosis Date Noted   Aortic atherosclerosis ([place]) 10/22/2020   Age-related osteoporosis with current pathological fracture with routine healing 02/21/2019   Chronic kidney disease (CKD), stage IV (severe) ([place]) 02/21/2019   Longstanding persistent atrial fibrillation ([place])    Glaucoma    Long term (current) use of anticoagulants 06/24/2010   GLAUCOMA 08/07/2008   GERD 08/07/2008   PACEMAKER, PERMANENT 08/07/2008   Vitamin B 12 deficiency 07/19/2008   RADIATION PROCTITIS 07/18/2008   DIVERTICULOSIS, COLON 07/18/2008   Prostate cancer, in remission 07/18/2008   Essential hypertension 05/09/2008   Hyperlipidemia 02/06/2008   Atrial fibrillation ([place]) 02/06/2008   Hypothyroidism 12/01/2007    Past Surgical History:  Procedure Laterality Date   [place] AND ABLATION  2001, 2003   failure   CATARACT EXTRACTION     CYSTOGRAM N/A 09/21/2018   Procedure: CYSTOGRAM;  Surgeon: Abbie Sons, MD;  Location: [facility];  Service: Urology;  Laterality: N/A;   CYSTOSCOPY WITH URETHRAL DILATATION N/A 01/29/2017   Procedure: CYSTOSCOPY WITH URETHRAL DILATATION;  Surgeon: Abbie Sons, MD;  Location: [facility];  Service: Urology;  Laterality: N/A;   CYSTOSCOPY WITH URETHRAL DILATATION N/A 09/21/2018   Procedure: CYSTOSCOPY WITH URETHRAL DILATATION;  Surgeon: Abbie Sons, MD;  Location: [facility];  Service: Urology;  Laterality: N/A;   CYSTOURETHROSCOPY N/A 11/07/2014   EP IMPLANTABLE DEVICE N/A 02/24/2016   Procedure: PPM Generator Changeout;  Surgeon: Deboraha Sprang, MD;  Location: [facility];  Service:  Cardiovascular;  Laterality: N/A;   gastric ulcer surgery     HERNIA REPAIR  1994, 2001, 2003   s/p drainage and complications, 06/7094, 04/8364 mesh removal   HIP ARTHROPLASTY Right 02/16/2019   Procedure: RIGHT ANTERIOR HIP HEMIARTHROPLASTY,CEMENTED;  Surgeon: Hessie Knows, MD;  Location: [facility];  Service: Orthopedics;  Laterality: Right;   INSERT / REPLACE / REMOVE PACEMAKER  07/2005   symptomatic bradycardia   kidney stones     NOSE SURGERY     cancer removed    PROSTATE SURGERY     SKIN CANCER DESTRUCTION     TOOTH EXTRACTION      Prior to Admission medications   Medication Sig Start Date End Date Taking? Authorizing Provider  acetaminophen (TYLENOL) 500 MG tablet Take 500 mg by mouth every 6 (six) hours as needed for moderate pain or headache.    [provider]  alendronate (FOSAMAX) 70 MG tablet TAKE 1 TABLET BY MOUTH EVERY 7 DAYS WITH A FULL GLASS OF WATER AND ON AN EMPTY STOMACH 04/30/20   Copland, Frederico Hamman, MD  amiodarone (PACERONE) 200 MG tablet TAKE 1/2 TABLET(100 MG) BY MOUTH DAILY 03/27/20   Deboraha Sprang, MD  cyanocobalamin (,VITAMIN B-12,) 1000 MCG/ML injection Inject 1,000 mcg into the muscle every 30 (thirty) days.    [provider]  docusate sodium (COLACE) 50 MG capsule Take 50 mg by mouth 2 (two) times daily as needed for mild constipation.    [provider]  dorzolamide-timolol (COSOPT) 22.3-6.8 MG/ML ophthalmic solution Place 1 drop into both eyes 2 (two) times daily.    [provider]  furosemide (LASIX) 20 MG tablet Take 1 tablet (20 mg total) by mouth daily. 10/16/20   Copland, Frederico Hamman, MD  levothyroxine (SYNTHROID) 100 MCG tablet TAKE 1 TABLET BY MOUTH DAILY BEFORE BREAKFAST 09/18/20   Copland, Frederico Hamman, MD  loperamide (IMODIUM) 2 MG capsule Take 2 mg by mouth as needed for diarrhea or loose stools.    [provider]  mirtazapine (REMERON) 15 MG tablet Take 1 tablet (15 mg total) by mouth at bedtime. 11/20/20    Copland, Frederico Hamman, MD  pantoprazole (PROTONIX) 40 MG tablet TAKE 1 TABLET(40 MG) BY MOUTH DAILY 08/05/20   Copland, Frederico Hamman, MD  polyethylene glycol (MIRALAX / GLYCOLAX) packet Take 17 g by mouth daily as needed for moderate constipation.     [provider]  pravastatin (PRAVACHOL) 40 MG tablet TAKE 1/2 TABLET(20 MG) BY MOUTH AT BEDTIME 07/04/20   Copland, Frederico Hamman, MD  simethicone (MYLICON) 294 MG chewable tablet Chew 125 mg by mouth every 6 (six) hours as needed for flatulence.    [provider]  tamsulosin (FLOMAX) 0.4 MG CAPS capsule TAKE 1 CAPSULE BY MOUTH DAILY 09/18/20   Copland, Frederico Hamman, MD  traMADol (ULTRAM) 50 MG tablet Take 1 tablet (50 mg total) by mouth every 6 (six) hours as needed for moderate pain. 11/20/20   Copland, Frederico Hamman, MD  warfarin (COUMADIN) 1 MG tablet TAKE 1 TO 1 AND 1/2 TABLETS BY MOUTH DAILY AS DIRECTED BY COUMADIN CLINIC 11/11/20  Deboraha Sprang, MD  Wheat Dextrin (BENEFIBER DRINK MIX PO) Take 1 scoop by mouth daily.    [provider]    Allergies Penicillins  Family History  Problem Relation Age of Onset   Breast cancer Mother    Bone cancer Father    Alcohol abuse Other    Coronary artery disease Other    Dementia Other    Diabetes Other     Social History Social History   Tobacco Use   Smoking status: Never   Smokeless tobacco: Never  Vaping Use   Vaping Use: Never used  Substance Use Topics   Alcohol use: No   Drug use: No    Review of Systems  Constitutional: No fever/chills.  Positive for generalized weakness. Eyes: No visual changes. ENT: No sore throat. Cardiovascular: Positive for chest wall pain. Respiratory: Denies shortness of breath. Gastrointestinal: No abdominal pain.  No nausea, no vomiting.  No diarrhea.  No constipation. Genitourinary: Positive for dysuria. Musculoskeletal: Negative for back pain. Skin: Negative for rash. Neurological: Negative for headaches, focal weakness or  numbness.  ____________________________________________   PHYSICAL EXAM:  VITAL SIGNS: ED Triage Vitals [11/21/20 1420]  Enc Vitals Group     BP 104/64     Pulse Rate 63     Resp 16     Temp 97.8 F (36.6 C)     Temp Source Oral     SpO2 99 %     Weight 124 lb 12 oz (56.6 kg)     Height 5\' 8"  (1.727 m)     Head Circumference      Peak Flow      Pain Score 0     Pain Loc      Pain Edu?      Excl. in [place]?     Constitutional: Alert and oriented. Eyes: Conjunctivae are normal. Head: Atraumatic. Nose: No congestion/rhinnorhea. Mouth/Throat: Mucous membranes are dry. Neck: Normal ROM Cardiovascular: Normal rate, regular rhythm. Grossly normal heart sounds.  2+ radial pulses bilaterally. Respiratory: Normal respiratory effort.  No retractions. Lungs CTAB.  Tender to palpation over left lateral chest wall. Gastrointestinal: Soft and nontender. No distention. Genitourinary: deferred Musculoskeletal: No lower extremity tenderness nor edema. Neurologic:  Normal speech and language. No gross focal neurologic deficits are appreciated. Skin:  Skin is warm, dry and intact. No rash noted. Psychiatric: Mood and affect are normal. Speech and behavior are normal.  ____________________________________________   LABS (all labs ordered are listed, but only abnormal results are displayed)  Labs Reviewed  CBC WITH DIFFERENTIAL/PLATELET - Abnormal; Notable for the following components:      Result Value   RBC 3.40 (*)    Hemoglobin 10.9 (*)    HCT 34.8 (*)    MCV 102.4 (*)    RDW 15.8 (*)    All other components within normal limits  COMPREHENSIVE METABOLIC PANEL - Abnormal; Notable for the following components:   Potassium 5.3 (*)    Chloride 112 (*)    CO2 13 (*)    Glucose, Bld 131 (*)    BUN 96 (*)    Creatinine, Ser 5.05 (*)    Albumin 3.3 (*)    GFR, Estimated 10 (*)    All other components within normal limits  PROTIME-INR - Abnormal; Notable for the following  components:   Prothrombin Time 29.9 (*)    INR 2.8 (*)    All other components within normal limits  RESP PANEL BY RT-PCR (FLU A&B, COVID)  ARPGX2  URINALYSIS, COMPLETE (UACMP) WITH MICROSCOPIC   ____________________________________________  EKG  ED ECG REPORT I, Blake Divine, the attending physician, personally viewed and interpreted this ECG.   Date: 11/21/2020  EKG Time: 15:59  Rate: 60  Rhythm: normal sinus rhythm  Axis: Normal  Intervals:none  ST&T Change: None    PROCEDURES  Procedure(s) performed (including Critical Care):  .Critical Care  Date/Time: 11/21/2020 3:54 PM Performed by: Blake Divine, MD Authorized by: Blake Divine, MD   Critical care provider statement:    Critical care time (minutes):  45   Critical care time was exclusive of:  Separately billable procedures and treating other patients and teaching time   Critical care was necessary to treat or prevent imminent or life-threatening deterioration of the following conditions:  Renal failure   Critical care was time spent personally by me on the following activities:  Discussions with consultants, evaluation of patient's response to treatment, examination of patient, ordering and performing treatments and interventions, ordering and review of laboratory studies, ordering and review of radiographic studies, pulse oximetry, re-evaluation of patient's condition, obtaining history from patient or surrogate and review of old charts   I assumed direction of critical care for this patient from another provider in my specialty: no     Care discussed with: admitting provider     ____________________________________________   INITIAL IMPRESSION / ASSESSMENT AND PLAN / ED COURSE      87 year old male with past medical history of hypertension, hyperlipidemia, CKD, bradycardia status post pacemaker, and atrial fibrillation on Coumadin who presents to the ED for abnormal labs noted at his PCPs office with  worsening renal function.  Repeat labs today confirm acute renal failure with creatinine of above 5, bicarb of 13, and mild hyperkalemia at 5.3.  Case discussed with Dr. Juleen China of nephrology, we will give 1 L IV fluid bolus and start on bicarb drip.  Worsening renal function likely due to poor p.o. intake, although we will further assess for obstructive process with renal ultrasound.  Plan to check chest x-ray to ensure his pneumothorax is not worsening, also check UA.  Chest x-ray reviewed by me and redemonstrates rib fractures but no residual pneumothorax.  Renal ultrasound shows signs of bladder outlet obstruction but no hydronephrosis.  Findings were discussed with Dr. Diamantina Providence of urology, who recommends holding off on Foley catheter but patient to be evaluated during admission for possible urethral dilation.  Case discussed with hospitalist for admission.      ____________________________________________   FINAL CLINICAL IMPRESSION(S) / ED DIAGNOSES  Final diagnoses:  Acute renal failure, unspecified acute renal failure type ([name])  Generalized weakness  Closed fracture of multiple ribs of left side with routine healing, subsequent encounter     ED Discharge Orders     None        Note:  This document was prepared using Dragon voice recognition software and may include unintentional dictation errors.    Blake Divine, MD 11/21/20 [phone]

## 2020-11-21 NOTE — ED Notes (Signed)
Patients daughter updated on plan of care, patient provided with warm blanket and meal. Awaiting hospitalist.

## 2020-11-21 NOTE — Telephone Encounter (Signed)
I have already called the patient's daughter, and after my conversation yesterday, they understand what a creatinine of 4.8 means.  They will be leaving now to go to the ER at [facility].

## 2020-11-21 NOTE — ED Notes (Signed)
MD at bedside. 

## 2020-11-21 NOTE — ED Notes (Signed)
[name] [name] Daughter [phone]

## 2020-11-21 NOTE — Telephone Encounter (Signed)
I gave report to intake nurse at [facility] ER, and they are expecting him.  Cr 4.8 now with GFR 9 Normally around 2 - 2.3 Cr

## 2020-11-21 NOTE — ED Notes (Signed)
Patient transported to X-ray 

## 2020-11-21 NOTE — Telephone Encounter (Signed)
[facility] called critical results, BUN - 93, CRT - 4.76, GFR - 9.83. Results given to Dr Lorelei Pont.

## 2020-11-21 NOTE — H&P (Signed)
History and Physical   [name] [name] NGE:[id number] DOB: 03/14/1923 DOA: 11/21/2020  Referring MD/NP/PA: Dr. Charna Archer  PCP: Owens Loffler, MD   Outpatient Specialists: Dr. Juleen China  Patient coming from: Home  Chief Complaint: Acute renal failure  HPI: [name] [name] is a 87 y.o. male with medical history significant of essential hypertension, chronic kidney disease stage IV, symptomatic bradycardia with pacemaker in place, hypertension lipidemia, prostate cancer, paroxysmal atrial fibrillation, diverticular disease GERD and multiple other medical problems who is currently on Coumadin.  Patient presented the ER secondary to having abnormal labs.  His daughter brought him in and mentioned that he has been gradually declining over the last couple of weeks.  Patient has had multiple falls in the last 1 month.  He had small rib fracture with pneumothorax.  Was on the service cardiovascular surgery at the time.  Patient continue to follow with PCP.  He was found to have worsening renal function although he had chronic kidney disease stage III at the time.  His intake has been declining.  He is also not been himself.  He did mention that he had dysuria with some abdominal flank.  No hematuria.  No diarrhea no vomiting.  Patient now has worsening renal failure with creatinine more than 5.  Suspicion for UTI and possible obstructive uropathy was made.  He is being admitted to the hospital for treatment of acute on chronic kidney failure..  ED Course: Temperature 97.5 blood pressure 104/64, pulse 67 respirate of 18 oxygen sats 99% room air.  White count is 6.6 hemoglobin 10.9 platelets 237.  Sodium is 137 potassium 5.3 chloride 112 CO2 30 BUN 96 creatinine 5.05 and calcium 8.9.  INR is 2.8 and glucose 131.  COVID-19 and other viral screen negative.  Urinalysis showed yellow urine positive for leukocytes large.  WBC more than 50 and rare bacteria.  Renal ultrasound shows no evidence of hydronephrosis.  There  is bladder wall thickening with trabeculation suggestive chronic outflow obstruction.  Patient has had previous obstructive uropathy and urology is aware.  Plan is for urology to see patient at some point.  Review of Systems: As per HPI otherwise 10 point review of systems negative.    Past Medical History:  Diagnosis Date   Anemia    Aortic atherosclerosis ([name])    Balance problems    uses walker   Cardiac pacemaker in situ 07/2005   symptomatic bradycardia   CKD (chronic kidney disease) stage 3, GFR 30-59 ml/min ([name]) 04/13/2017   Closed right hip fracture, initial encounter ([place]) 02/14/2019   Diverticulosis of colon (without mention of hemorrhage)    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    GERD (gastroesophageal reflux disease)    Glaucoma    Hyperlipidemia    Hypertension    Neck arthritis, Severe, multi-level 06/09/2013   Paroxysmal atrial fibrillation ([name])    s/p failed ablation   Prostate cancer ([place]) 12/2000   5 weeks XRT + brachytherapy   Radiation proctitis    Sinoatrial node dysfunction ([name])    Skin cancer    Skin cancer of eyelid, left 10/2007   Unspecified glaucoma(365.9)    Unspecified hypothyroidism    Vitamin B12 deficiency     Past Surgical History:  Procedure Laterality Date   [place] AND ABLATION  2001, 2003   failure   CATARACT EXTRACTION     CYSTOGRAM N/A 09/21/2018   Procedure: CYSTOGRAM;  Surgeon: Abbie Sons, MD;  Location: [facility];  Service: Urology;  Laterality: N/A;   CYSTOSCOPY WITH URETHRAL DILATATION N/A 01/29/2017   Procedure: CYSTOSCOPY WITH URETHRAL DILATATION;  Surgeon: Abbie Sons, MD;  Location: [facility];  Service: Urology;  Laterality: N/A;   CYSTOSCOPY WITH URETHRAL DILATATION N/A 09/21/2018   Procedure: CYSTOSCOPY WITH URETHRAL DILATATION;  Surgeon: Abbie Sons, MD;  Location: [facility];  Service: Urology;  Laterality: N/A;   CYSTOURETHROSCOPY N/A  11/07/2014   EP IMPLANTABLE DEVICE N/A 02/24/2016   Procedure: PPM Generator Changeout;  Surgeon: Deboraha Sprang, MD;  Location: [facility];  Service: Cardiovascular;  Laterality: N/A;   gastric ulcer surgery     HERNIA REPAIR  1994, 2001, 2003   s/p drainage and complications, 03/6107, 08/452 mesh removal   HIP ARTHROPLASTY Right 02/16/2019   Procedure: RIGHT ANTERIOR HIP HEMIARTHROPLASTY,CEMENTED;  Surgeon: Hessie Knows, MD;  Location: [facility];  Service: Orthopedics;  Laterality: Right;   INSERT / REPLACE / REMOVE PACEMAKER  07/2005   symptomatic bradycardia   kidney stones     NOSE SURGERY     cancer removed    PROSTATE SURGERY     SKIN CANCER DESTRUCTION     TOOTH EXTRACTION       reports that he has never smoked. He has never used smokeless tobacco. He reports that he does not drink alcohol and does not use drugs.  Allergies  Allergen Reactions   Penicillins Rash and Other (See Comments)    Has patient had a PCN reaction causing immediate rash, facial/tongue/throat swelling, SOB or lightheadedness with hypotension: {no Has patient had a PCN reaction causing severe rash involving mucus membranes or skin necrosis: {no Has patient had a PCN reaction that required hospitalization {no Has patient had a PCN reaction occurring within the last 10 years: {no If all of the above answers are "NO", then may proceed with Cephalosporin use.    Family History  Problem Relation Age of Onset   Breast cancer Mother    Bone cancer Father    Alcohol abuse Other    Coronary artery disease Other    Dementia Other    Diabetes Other      Prior to Admission medications   Medication Sig Start Date End Date Taking? Authorizing Provider  alendronate (FOSAMAX) 70 MG tablet TAKE 1 TABLET BY MOUTH EVERY 7 DAYS WITH A FULL GLASS OF WATER AND ON AN EMPTY STOMACH Patient taking differently: Take 70 mg by mouth every Thursday. TAKE 1 TABLET BY MOUTH EVERY 7 DAYS WITH A FULL GLASS OF WATER AND  ON AN EMPTY STOMACH 04/30/20  Yes Copland, Frederico Hamman, MD  amiodarone (PACERONE) 200 MG tablet TAKE 1/2 TABLET(100 MG) BY MOUTH DAILY Patient taking differently: Take 100 mg by mouth every evening. 03/27/20  Yes Deboraha Sprang, MD  cyanocobalamin (,VITAMIN B-12,) 1000 MCG/ML injection Inject 1,000 mcg into the muscle every 30 (thirty) days.   Yes [provider]  docusate sodium (COLACE) 50 MG capsule Take 50 mg by mouth 2 (two) times daily as needed for mild constipation.   Yes [provider]  dorzolamide-timolol (COSOPT) 22.3-6.8 MG/ML ophthalmic solution Place 1 drop into both eyes 2 (two) times daily.   Yes [provider]  levothyroxine (SYNTHROID) 100 MCG tablet TAKE 1 TABLET BY MOUTH DAILY BEFORE BREAKFAST 09/18/20  Yes Copland, Frederico Hamman, MD  loperamide (IMODIUM) 2 MG capsule Take 2 mg by mouth as needed for diarrhea or loose stools.   Yes [provider]  mirtazapine (REMERON) 15 MG tablet Take 1 tablet (15 mg total) by mouth at bedtime. 11/20/20  Yes Copland, Frederico Hamman, MD  pantoprazole (PROTONIX) 40 MG tablet TAKE 1 TABLET(40 MG) BY MOUTH DAILY 08/05/20  Yes Copland, Frederico Hamman, MD  polyethylene glycol (MIRALAX / GLYCOLAX) packet Take 17 g by mouth daily as needed for moderate constipation.    Yes [provider]  pravastatin (PRAVACHOL) 40 MG tablet TAKE 1/2 TABLET(20 MG) BY MOUTH AT BEDTIME 07/04/20  Yes Copland, Frederico Hamman, MD  simethicone (MYLICON) 829 MG chewable tablet Chew 125 mg by mouth every 6 (six) hours as needed for flatulence.   Yes [provider]  tamsulosin (FLOMAX) 0.4 MG CAPS capsule TAKE 1 CAPSULE BY MOUTH DAILY 09/18/20  Yes Copland, Frederico Hamman, MD  traMADol (ULTRAM) 50 MG tablet Take 1 tablet (50 mg total) by mouth every 6 (six) hours as needed for moderate pain. 11/20/20  Yes Copland, Frederico Hamman, MD  warfarin (COUMADIN) 1 MG tablet TAKE 1 TO 1 AND 1/2 TABLETS BY MOUTH DAILY AS DIRECTED BY COUMADIN CLINIC Patient taking differently: Take  0.5-1 mg by mouth at bedtime. TAKE 1 TO 1 AND 1/2 TABLETS BY MOUTH DAILY AS DIRECTED BY COUMADIN CLINIC  0.5 mg everyday except for fri he gets 1 mg 11/11/20  Yes Deboraha Sprang, MD  Wheat Dextrin (BENEFIBER DRINK MIX PO) Take 1 scoop by mouth daily.   Yes [provider]  acetaminophen (TYLENOL) 500 MG tablet Take 500 mg by mouth every 6 (six) hours as needed for moderate pain or headache. Patient not taking: Reported on 11/21/2020    [provider]  furosemide (LASIX) 20 MG tablet Take 1 tablet (20 mg total) by mouth daily. Patient not taking: Reported on 11/21/2020 10/16/20   Owens Loffler, MD    Physical Exam: Vitals:   11/21/20 1700 11/21/20 1800 11/21/20 1955 11/21/20 2005  BP: 139/81   114/76  Pulse: 62 60  67  Resp: 12 12 16 18   Temp:    (!) 97.5 F (36.4 C)  TempSrc:    Oral  SpO2: 99% 99%  99%  Weight:      Height:          Constitutional: Frail, chronically ill looking, Vitals:   11/21/20 1700 11/21/20 1800 11/21/20 1955 11/21/20 2005  BP: 139/81   114/76  Pulse: 62 60  67  Resp: 12 12 16 18   Temp:    (!) 97.5 F (36.4 C)  TempSrc:    Oral  SpO2: 99% 99%  99%  Weight:      Height:       Eyes: PERRL, lids and conjunctivae normal ENMT: Mucous membranes are dry. Posterior pharynx clear of any exudate or lesions.Normal dentition.  Neck: normal, supple, no masses, no thyromegaly Respiratory: clear to auscultation bilaterally, no wheezing, no crackles. Normal respiratory effort. No accessory muscle use.  Cardiovascular: Regular rate and rhythm, no murmurs / rubs / gallops. No extremity edema. 2+ pedal pulses. No carotid bruits.  Abdomen: no tenderness, no masses palpated. No hepatosplenomegaly. Bowel sounds positive.  Musculoskeletal: no clubbing / cyanosis. No joint deformity upper and lower extremities. Good ROM, no contractures. Normal muscle tone.  Skin: no rashes, lesions, ulcers. No induration Neurologic: CN 2-12 grossly intact. Sensation  intact, DTR normal. Strength 5/5 in all 4.  Psychiatric: Difficulty hearing but mildly lethargic    Labs on Admission: I have personally reviewed following labs and imaging studies  CBC: Recent Labs  Lab 11/20/20 1536 11/21/20 1427  WBC 6.3 6.6  NEUTROABS 4.6 4.7  HGB 10.8* 10.9*  HCT 33.7* 34.8*  MCV 100.1* 102.4*  PLT 238.0 833   Basic Metabolic Panel: Recent Labs  Lab 11/20/20 1536 11/21/20 1427  NA 137 137  K 4.4 5.3*  CL 109 112*  CO2 15* 13*  GLUCOSE 123* 131*  BUN 93 Repeated and verified X2.* 96*  CREATININE 4.76 Repeated and verified X2.* 5.05*  CALCIUM 8.8 8.9   GFR: Estimated Creatinine Clearance: 7 mL/min (A) (by C-G formula based on SCr of 5.05 mg/dL (H)). Liver Function Tests: Recent Labs  Lab 11/20/20 1536 11/21/20 1427  AST 17 18  ALT 14 16  ALKPHOS 108 99  BILITOT 0.5 0.8  PROT 7.0 7.0  ALBUMIN 3.5 3.3*   No results for input(s): LIPASE, AMYLASE in the last 168 hours. No results for input(s): AMMONIA in the last 168 hours. Coagulation Profile: Recent Labs  Lab 11/21/20 1559  INR 2.8*   Cardiac Enzymes: No results for input(s): CKTOTAL, CKMB, CKMBINDEX, TROPONINI in the last 168 hours. BNP (last 3 results) No results for input(s): PROBNP in the last 8760 hours. HbA1C: No results for input(s): HGBA1C in the last 72 hours. CBG: No results for input(s): GLUCAP in the last 168 hours. Lipid Profile: No results for input(s): CHOL, HDL, LDLCALC, TRIG, CHOLHDL, LDLDIRECT in the last 72 hours. Thyroid Function Tests: No results for input(s): TSH, T4TOTAL, FREET4, T3FREE, THYROIDAB in the last 72 hours. Anemia Panel: No results for input(s): VITAMINB12, FOLATE, FERRITIN, TIBC, IRON, RETICCTPCT in the last 72 hours. Urine analysis:    Component Value Date/Time   COLORURINE YELLOW (A) 11/21/2020 1600   APPEARANCEUR HAZY (A) 11/21/2020 1600   APPEARANCEUR Cloudy (A) 10/24/2020 1309   LABSPEC 1.009 11/21/2020 1600   PHURINE 6.0 11/21/2020  1600   GLUCOSEU NEGATIVE 11/21/2020 1600   HGBUR MODERATE (A) 11/21/2020 1600   BILIRUBINUR NEGATIVE 11/21/2020 1600   BILIRUBINUR Negative 10/24/2020 1309   KETONESUR NEGATIVE 11/21/2020 1600   PROTEINUR 30 (A) 11/21/2020 1600   UROBILINOGEN 0.2 09/11/2020 1507   NITRITE NEGATIVE 11/21/2020 1600   LEUKOCYTESUR LARGE (A) 11/21/2020 1600   Sepsis Labs: @LABRCNTIP (procalcitonin:4,lacticidven:4) ) Recent Results (from the past 240 hour(s))  Resp Panel by RT-PCR (Flu A&B, Covid) Nasopharyngeal Swab     Status: None   Collection Time: 11/21/20  3:59 PM   Specimen: Nasopharyngeal Swab; Nasopharyngeal(NP) swabs in vial transport medium  Result Value Ref Range Status   SARS Coronavirus 2 by RT PCR NEGATIVE NEGATIVE Final    Comment: (NOTE) SARS-CoV-2 target nucleic acids are NOT DETECTED.  The SARS-CoV-2 RNA is generally detectable in upper respiratory specimens during the acute phase of infection. The lowest concentration of SARS-CoV-2 viral copies this assay can detect is 138 copies/mL. A negative result does not preclude SARS-Cov-2 infection and should not be used as the sole basis for treatment or other patient management decisions. A negative result may occur with  improper specimen collection/handling, submission of specimen other than nasopharyngeal swab, presence of viral mutation(s) within the areas targeted by this assay, and inadequate number of viral copies(<138 copies/mL). A negative result must be combined with clinical observations, patient history, and epidemiological information. The expected result is Negative.  Fact Sheet for Patients:  EntrepreneurPulse.com.au  Fact Sheet for Healthcare Providers:  IncredibleEmployment.be  This test is no t yet approved or cleared by the [place] FDA and  has been authorized for detection and/or diagnosis of SARS-CoV-2 by FDA under an Emergency Use  Authorization (EUA). This EUA will  remain  in effect (meaning this test can be used) for the duration of the COVID-19 declaration under Section 564(b)(1) of the Act, 21 U.S.C.section 360bbb-3(b)(1), unless the authorization is terminated  or revoked sooner.       Influenza A by PCR NEGATIVE NEGATIVE Final   Influenza B by PCR NEGATIVE NEGATIVE Final    Comment: (NOTE) The Xpert Xpress SARS-CoV-2/FLU/RSV plus assay is intended as an aid in the diagnosis of influenza from Nasopharyngeal swab specimens and should not be used as a sole basis for treatment. Nasal washings and aspirates are unacceptable for Xpert Xpress SARS-CoV-2/FLU/RSV testing.  Fact Sheet for Patients: EntrepreneurPulse.com.au  Fact Sheet for Healthcare Providers: IncredibleEmployment.be  This test is not yet approved or cleared by the [place] FDA and has been authorized for detection and/or diagnosis of SARS-CoV-2 by FDA under an Emergency Use Authorization (EUA). This EUA will remain in effect (meaning this test can be used) for the duration of the COVID-19 declaration under Section 564(b)(1) of the Act, 21 U.S.C. section 360bbb-3(b)(1), unless the authorization is terminated or revoked.  Performed at [facility], [address] [place], [place] [address]      Radiological Exams on Admission: DG Chest 2 View  Result Date: 11/21/2020 CLINICAL DATA:  Weakness, left rib pain after fall several weeks ago EXAM: CHEST - 2 VIEW COMPARISON:  02/14/2019 FINDINGS: Frontal and lateral views of the chest are obtained. Dual lead pacer is again noted and stable. The cardiac silhouette is unremarkable. There is chronic elevation of the left hemidiaphragm. Trace left pleural fluid. No airspace disease or pneumothorax. There are displaced left lateral seventh and eighth rib fractures noted. No other acute bony abnormalities. IMPRESSION: 1. Minimally displaced left lateral seventh and eighth rib fractures. 2.  Trace left pleural effusion. Electronically Signed   By: Randa Ngo M.D.   On: 11/21/2020 16:02   US Renal  Result Date: 11/21/2020 CLINICAL DATA:  Renal failure EXAM: RENAL / URINARY TRACT ULTRASOUND COMPLETE COMPARISON:  CT abdomen and pelvis 12/10/2016 FINDINGS: Right Kidney: Renal measurements: 8.6 x 4.7 x 4.6 cm = volume: 96 mL. Small circumscribed cysts are demonstrated in the upper pole measuring up to 1.3 cm maximal diameter. No significant parenchymal thinning or hydronephrosis. Left Kidney: Renal measurements: 9.3 x 5 x 5.1 cm = volume: 123 mL. 2.4 cm diameter cyst in the midpole was also present on prior CT. No significant parenchymal thinning or hydronephrosis. Bladder: There is heterogeneous bladder wall thickening with trabeculation likely indicating chronic outflow obstruction. Echogenic structure in the base of the bladder corresponds to abnormality seen at prior CT. There are surgical clips in the area suggesting this could be postoperative. Alternatively, a chronic calcified stone or polypoid structure could have this appearance. Mild diffuse internal echoes within the bladder suggesting debris. This could represent concentrated urine, hemorrhage, or infection. Correlate with urinalysis. A chronic finding of the echogenic process suggest benign etiology but if stone is suspected, cystoscopy may be useful in further evaluation. Other: None. IMPRESSION: 1. No evidence of hydronephrosis in either kidney. 2. Small bilateral renal cysts. 3. Bladder wall thickening with trabeculation suggesting chronic outflow obstruction. Nonspecific large echogenic structure demonstrated in the base of the bladder which was present on prior CT from 12/10/2016. Diffuse internal echoes in the urine. Correlate with urinalysis. Consider urology consultation. Electronically Signed   By: Lucienne Capers M.D.   On: 11/21/2020 17:55      Assessment/Plan Principal Problem:  AKI (acute kidney injury)  ([place]) Active Problems:   Hypothyroidism   Hyperlipidemia   Essential hypertension   GERD   Prostate cancer, in remission   PACEMAKER, PERMANENT   Long term (current) use of anticoagulants   Longstanding persistent atrial fibrillation ([name])   Chronic kidney disease (CKD), stage IV (severe) ([name])     #1 AKI on chronic kidney disease stage III: Most likely prerenal.  Poor oral intake with patient's recent hospitalizations may be contributing.  Patient however has mild cardiac disease.  We will treat patient aggressively with IV fluids.  We will follow nephrology recommendation at this point.  No immediate need for hemodialysis.  #2 obstructive uropathy: Previously scheduled for cystoscopy with urethral dilatation on August 9.  May need to have this done while in the hospital.  #3 atrial fibrillation: Continue warfarin per pharmacy.  Continue amiodarone.  #4 possible UTI: No evidence of active infection.  Urine cultures to be done and if positive IV antibiotics.  #5 essential hypertension: Hold Lasix.  Relatively hypotensive so monitor but hold meds.  #6 GERD: Continue PPIs  #7 hyperlipidemia: Continue pravastatin  #8 hypothyroidism: Continue levothyroxine     DVT prophylaxis: Warfarin Code Status: DNR Family Communication: Daughter Disposition Plan: To be determined Consults called: Dr. Juleen China, nephrology Admission status: Inpatient  Severity of Illness: The appropriate patient status for this patient is INPATIENT. Inpatient status is judged to be reasonable and necessary in order to provide the required intensity of service to ensure the patient's safety. The patient's presenting symptoms, physical exam findings, and initial radiographic and laboratory data in the context of their chronic comorbidities is felt to place them at high risk for further clinical deterioration. Furthermore, it is not anticipated that the patient will be medically stable for discharge from the  hospital within 2 midnights of admission. The following factors support the patient status of inpatient.   " The patient's presenting symptoms include weakness. " The worrisome physical exam findings include dry mucous membrane. " The initial radiographic and laboratory data are worrisome because of worsening renal function. " The chronic co-morbidities include obstructive uropathy.   * I certify that at the point of admission it is my clinical judgment that the patient will require inpatient hospital care spanning beyond 2 midnights from the point of admission due to high intensity of service, high risk for further deterioration and high frequency of surveillance required.Barbette Merino MD [facility] Pager [phone]  If 7PM-7AM, please contact night-coverage [email or web address] Password [facility]  11/21/2020, 8:41 PM

## 2020-11-21 NOTE — Consult Note (Addendum)
ANTICOAGULATION CONSULT NOTE - Initial Consult  Pharmacy Consult for Warfarin Dosing Indication: atrial fibrillation  Allergies  Allergen Reactions   Penicillins Rash and Other (See Comments)    Has patient had a PCN reaction causing immediate rash, facial/tongue/throat swelling, SOB or lightheadedness with hypotension: {no Has patient had a PCN reaction causing severe rash involving mucus membranes or skin necrosis: {no Has patient had a PCN reaction that required hospitalization {no Has patient had a PCN reaction occurring within the last 10 years: {no If all of the above answers are "NO", then may proceed with Cephalosporin use.    Patient Measurements: Height: 5\' 8"  (172.7 cm) Weight: 56.6 kg (124 lb 12 oz) IBW/kg (Calculated) : 68.4 Heparin Dosing Weight: 56.6 kg  Vital Signs: Temp: 97.7 F (36.5 C) (08/25 2105) Temp Source: Oral (08/25 2105) BP: 127/69 (08/25 2105) Pulse Rate: 64 (08/25 2105)  Labs: Recent Labs    11/20/20 1536 11/21/20 1427 11/21/20 1559  HGB 10.8* 10.9*  --   HCT 33.7* 34.8*  --   PLT 238.0 237  --   LABPROT  --   --  29.9*  INR  --   --  2.8*  CREATININE 4.76 Repeated and verified X2.* 5.05*  --     Estimated Creatinine Clearance: 7 mL/min (A) (by C-G formula based on SCr of 5.05 mg/dL (H)).   Medical History: Past Medical History:  Diagnosis Date   Anemia    Aortic atherosclerosis ([name])    Balance problems    uses walker   Cardiac pacemaker in situ 07/2005   symptomatic bradycardia   CKD (chronic kidney disease) stage 3, GFR 30-59 ml/min ([name]) 04/13/2017   Closed right hip fracture, initial encounter ([place]) 02/14/2019   Diverticulosis of colon (without mention of hemorrhage)    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    GERD (gastroesophageal reflux disease)    Glaucoma    Hyperlipidemia    Hypertension    Neck arthritis, Severe, multi-level 06/09/2013   Paroxysmal atrial fibrillation ([name])    s/p failed ablation    Prostate cancer ([place]) 12/2000   5 weeks XRT + brachytherapy   Radiation proctitis    Sinoatrial node dysfunction ([name])    Skin cancer    Skin cancer of eyelid, left 10/2007   Unspecified glaucoma(365.9)    Unspecified hypothyroidism    Vitamin B12 deficiency     Medications:  Medications Prior to Admission  Medication Sig Dispense Refill Last Dose   alendronate (FOSAMAX) 70 MG tablet TAKE 1 TABLET BY MOUTH EVERY 7 DAYS WITH A FULL GLASS OF WATER AND ON AN EMPTY STOMACH (Patient taking differently: Take 70 mg by mouth every Thursday. TAKE 1 TABLET BY MOUTH EVERY 7 DAYS WITH A FULL GLASS OF WATER AND ON AN EMPTY STOMACH) 12 tablet 3 11/14/2020 at unk   amiodarone (PACERONE) 200 MG tablet TAKE 1/2 TABLET(100 MG) BY MOUTH DAILY (Patient taking differently: Take 100 mg by mouth every evening.) 45 tablet 3 11/20/2020 at 2100   cyanocobalamin (,VITAMIN B-12,) 1000 MCG/ML injection Inject 1,000 mcg into the muscle every 30 (thirty) days.   Past Month at unk   docusate sodium (COLACE) 50 MG capsule Take 50 mg by mouth 2 (two) times daily as needed for mild constipation.   prn at prn   dorzolamide-timolol (COSOPT) 22.3-6.8 MG/ML ophthalmic solution Place 1 drop into both eyes 2 (two) times daily.   11/21/2020 at 1130   levothyroxine (SYNTHROID) 100 MCG tablet TAKE 1 TABLET BY  MOUTH DAILY BEFORE BREAKFAST 90 tablet 3 11/20/2020 at am   loperamide (IMODIUM) 2 MG capsule Take 2 mg by mouth as needed for diarrhea or loose stools.   prn at prn   mirtazapine (REMERON) 15 MG tablet Take 1 tablet (15 mg total) by mouth at bedtime. 30 tablet 3 11/20/2020 at 2100   pantoprazole (PROTONIX) 40 MG tablet TAKE 1 TABLET(40 MG) BY MOUTH DAILY 90 tablet 3 11/21/2020 at 1130   polyethylene glycol (MIRALAX / GLYCOLAX) packet Take 17 g by mouth daily as needed for moderate constipation.    prn at prn   pravastatin (PRAVACHOL) 40 MG tablet TAKE 1/2 TABLET(20 MG) BY MOUTH AT BEDTIME 45 tablet 3 11/20/2020 at 2100   simethicone  (MYLICON) 726 MG chewable tablet Chew 125 mg by mouth every 6 (six) hours as needed for flatulence.   prn at prn   tamsulosin (FLOMAX) 0.4 MG CAPS capsule TAKE 1 CAPSULE BY MOUTH DAILY 90 capsule 3 11/20/2020 at am   traMADol (ULTRAM) 50 MG tablet Take 1 tablet (50 mg total) by mouth every 6 (six) hours as needed for moderate pain. 120 tablet 1 11/21/2020 at am   warfarin (COUMADIN) 1 MG tablet TAKE 1 TO 1 AND 1/2 TABLETS BY MOUTH DAILY AS DIRECTED BY COUMADIN CLINIC (Patient taking differently: Take 0.5-1 mg by mouth at bedtime. TAKE 1 TO 1 AND 1/2 TABLETS BY MOUTH DAILY AS DIRECTED BY COUMADIN CLINIC  0.5 mg everyday except for fri he gets 1 mg) 45 tablet 2 11/20/2020 at 2100   Wheat Dextrin (BENEFIBER DRINK MIX PO) Take 1 scoop by mouth daily.   prn at prn   acetaminophen (TYLENOL) 500 MG tablet Take 500 mg by mouth every 6 (six) hours as needed for moderate pain or headache. (Patient not taking: Reported on 11/21/2020)   Not Taking   furosemide (LASIX) 20 MG tablet Take 1 tablet (20 mg total) by mouth daily. (Patient not taking: Reported on 11/21/2020) 90 tablet 1 Not Taking   Scheduled:   [START ON 11/22/2020] levothyroxine  100 mcg Oral QAC breakfast   mirtazapine  15 mg Oral QHS   pantoprazole  40 mg Oral Daily   [START ON 11/22/2020] pravastatin  20 mg Oral q1800   warfarin  1 mg Oral NOW   [START ON 11/22/2020] Warfarin - Pharmacist Dosing Inpatient   Does not apply q1600   Infusions:    sodium bicarbonate (isotonic) infusion in sterile water     PRN: acetaminophen **OR** acetaminophen, calcium carbonate, camphor-menthol **AND** hydrOXYzine, docusate sodium, feeding supplement (NEPRO CARB STEADY), ondansetron **OR** ondansetron (ZOFRAN) IV, sorbitol, zolpidem Anti-infectives (From admission, onward)    None       Assessment: Pharmacy has been consulted for Warfarin dosing on 87yo patient with history of atrial fibrillation. Patient's home dose is as follows: 0.5mg  daily EXCEPT on  Friday, where he takes 1mg .  8/25: INR 2.8 0.5mg  dose ordered  Goal of Therapy:  INR 2-3 Monitor platelets by anticoagulation protocol: Yes   Plan:  --Will order Warfarin 0.5mg  PO x 1 dose(continuing home dose) --recheck INR with AM labs --CBC daily  Shaya Altamura A Randol Zumstein 11/21/2020,9:47 PM

## 2020-11-21 NOTE — ED Notes (Signed)
Bladder Scan: 271 ml

## 2020-11-22 DIAGNOSIS — N179 Acute kidney failure, unspecified: Principal | ICD-10-CM

## 2020-11-22 DIAGNOSIS — N35919 Unspecified urethral stricture, male, unspecified site: Secondary | ICD-10-CM

## 2020-11-22 LAB — CBC
HCT: 31.9 % — ABNORMAL LOW (ref 39.0–52.0)
Hemoglobin: 10.2 g/dL — ABNORMAL LOW (ref 13.0–17.0)
MCH: 31.9 pg (ref 26.0–34.0)
MCHC: 32 g/dL (ref 30.0–36.0)
MCV: 99.7 fL (ref 80.0–100.0)
Platelets: 224 10*3/uL (ref 150–400)
RBC: 3.2 MIL/uL — ABNORMAL LOW (ref 4.22–5.81)
RDW: 15.6 % — ABNORMAL HIGH (ref 11.5–15.5)
WBC: 6.3 10*3/uL (ref 4.0–10.5)
nRBC: 0 % (ref 0.0–0.2)

## 2020-11-22 LAB — COMPREHENSIVE METABOLIC PANEL
ALT: 15 U/L (ref 0–44)
AST: 17 U/L (ref 15–41)
Albumin: 2.9 g/dL — ABNORMAL LOW (ref 3.5–5.0)
Alkaline Phosphatase: 93 U/L (ref 38–126)
Anion gap: 9 (ref 5–15)
BUN: 89 mg/dL — ABNORMAL HIGH (ref 8–23)
CO2: 20 mmol/L — ABNORMAL LOW (ref 22–32)
Calcium: 8.1 mg/dL — ABNORMAL LOW (ref 8.9–10.3)
Chloride: 108 mmol/L (ref 98–111)
Creatinine, Ser: 4.77 mg/dL — ABNORMAL HIGH (ref 0.61–1.24)
GFR, Estimated: 11 mL/min — ABNORMAL LOW (ref >60)
Glucose, Bld: 138 mg/dL — ABNORMAL HIGH (ref 70–99)
Potassium: 4.1 mmol/L (ref 3.5–5.1)
Sodium: 137 mmol/L (ref 135–145)
Total Bilirubin: 0.8 mg/dL (ref 0.3–1.2)
Total Protein: 6.3 g/dL — ABNORMAL LOW (ref 6.5–8.1)

## 2020-11-22 LAB — PHOSPHORUS: Phosphorus: 6.8 mg/dL — ABNORMAL HIGH (ref 2.5–4.6)

## 2020-11-22 LAB — PROTIME-INR
INR: 3 — ABNORMAL HIGH (ref 0.8–1.2)
Prothrombin Time: 31 seconds — ABNORMAL HIGH (ref 11.4–15.2)

## 2020-11-22 MED ORDER — ADULT MULTIVITAMIN W/MINERALS CH
1.0000 | ORAL_TABLET | Freq: Every day | ORAL | Status: DC
  Administered 2020-11-22 – 2020-11-29 (×7): 1 via ORAL
  Filled 2020-11-22 (×8): qty 1

## 2020-11-22 MED ORDER — HYDRALAZINE HCL 20 MG/ML IJ SOLN: 10.0000 mg | INTRAMUSCULAR | Status: DC | PRN

## 2020-11-22 MED ORDER — ENSURE ENLIVE PO LIQD
237.0000 mL | Freq: Three times a day (TID) | ORAL | Status: DC
  Administered 2020-11-22 – 2020-11-27 (×9): 237 mL via ORAL

## 2020-11-22 NOTE — Progress Notes (Signed)
PROGRESS NOTE    [name] [name]  YNW:[id number] DOB: January 09, 1923 DOA: 11/21/2020 PCP: Owens Loffler, MD    Brief Narrative:  87 y.o. male with medical history significant of essential hypertension, chronic kidney disease stage IV, symptomatic bradycardia with pacemaker in place, hypertension lipidemia, prostate cancer, paroxysmal atrial fibrillation, diverticular disease GERD and multiple other medical problems who is currently on Coumadin.  Patient presented the ER secondary to having abnormal labs.  His daughter brought him in and mentioned that he has been gradually declining over the last couple of weeks.  Patient has had multiple falls in the last 1 month.  He had small rib fracture with pneumothorax.  Was on the service cardiovascular surgery at the time.  Patient continue to follow with PCP.  He was found to have worsening renal function although he had chronic kidney disease stage III at the time.  His intake has been declining.  He is also not been himself.  He did mention that he had dysuria with some abdominal flank.  No hematuria.  No diarrhea no vomiting.  Patient now has worsening renal failure with creatinine more than 5.  Admitted to the medicine service with nephrology and urology aware.  AKI likely multifactorial.  Per urology obstructive uropathy and associated AKI is unlikely due to lack of hydronephrosis on imaging.  Patient's creatinine is improving while on IV fluids.  Urology planning tentatively for cystoscopy, dilatation and Foley placement in operating room early next week   Assessment & Plan:   Principal Problem:   AKI (acute kidney injury) ([place]) Active Problems:   Hypothyroidism   Hyperlipidemia   Essential hypertension   GERD   Prostate cancer, in remission   PACEMAKER, PERMANENT   Long term (current) use of anticoagulants   Longstanding persistent atrial fibrillation ([name])   Chronic kidney disease (CKD), stage IV (severe) ([name])  AKI on CKD stage  IIIb Multifactorial, prerenal versus obstructive uropathy Poor p.o. intake, recent hospitalizations Plan: Continue bicarbonate infusion Monitor kidney function Continue Flomax Appreciate nephrology input Appreciate urology input Tentative plan for cystoscopy, dilatation, Foley placement in OR early next week  Paroxysmal atrial fibrillation Currently controlled on amiodarone Was therapeutic on warfarin Warfarin now being held in anticipation of surgical procedure  Possible UTI Leukocytes on UA No other clinical indications of infection Suspect asymptomatic bacteriuria For antibiotics for now  Essential hypertension Lasix on hold As needed hydralazine  GERD PPI  Hyperlipidemia Pravastatin  Hypothyroidism Synthroid   DVT prophylaxis: SCDs Code Status: Full Family Communication: Daughter at bedside 8/26 Disposition Plan: Status is: Inpatient  Remains inpatient appropriate because:Inpatient level of care appropriate due to severity of illness  Dispo: The patient is from: Home              Anticipated d/c is to: Home              Patient currently is not medically stable to d/c.   Difficult to place patient No       Level of care: Med-Surg  Consultants:  Nephrology Urology  Procedures: None  Antimicrobials:  None   Subjective: Seen and examined.  Sitting up in bed eating breakfast.  No visible distress.  Objective: Vitals:   11/21/20 2105 11/22/20 0540 11/22/20 0656 11/22/20 0811  BP: 127/69  112/65 (!) 98/58  Pulse: 64  67 60  Resp: 18  20 17   Temp: 97.7 F (36.5 C)  97.7 F (36.5 C) 98.1 F (36.7 C)  TempSrc: Oral  SpO2: 100%  100% 99%  Weight:  59.7 kg    Height:        Intake/Output Summary (Last 24 hours) at 11/22/2020 1130 Last data filed at 11/22/2020 0900 Gross per 24 hour  Intake 120 ml  Output --  Net 120 ml   Filed Weights   11/21/20 1420 11/22/20 0540  Weight: 56.6 kg 59.7 kg    Examination:  General exam:  Appears calm and comfortable  Respiratory system: Poor respiratory effort.  Lungs clear.  Normal work of breathing.  Room air Cardiovascular system: S1-S2, regular rate, regular rhythm, 2/6 murmur, no pedal edema Gastrointestinal system: Soft, nontender, nondistended, normal bowel sounds Central nervous system: Alert and oriented. No focal neurological deficits. Extremities: Symmetric 5 x 5 power. Skin: No rashes, lesions or ulcers Psychiatry: Judgement and insight appear normal. Mood & affect appropriate.     Data Reviewed: I have personally reviewed following labs and imaging studies  CBC: Recent Labs  Lab 11/20/20 1536 11/21/20 1427 11/22/20 0444  WBC 6.3 6.6 6.3  NEUTROABS 4.6 4.7  --   HGB 10.8* 10.9* 10.2*  HCT 33.7* 34.8* 31.9*  MCV 100.1* 102.4* 99.7  PLT 238.0 237 454   Basic Metabolic Panel: Recent Labs  Lab 11/20/20 1536 11/21/20 1427 11/22/20 0444  NA 137 137 137  K 4.4 5.3* 4.1  CL 109 112* 108  CO2 15* 13* 20*  GLUCOSE 123* 131* 138*  BUN 93 Repeated and verified X2.* 96* 89*  CREATININE 4.76 Repeated and verified X2.* 5.05* 4.77*  CALCIUM 8.8 8.9 8.1*  PHOS  --   --  6.8*   GFR: Estimated Creatinine Clearance: 7.8 mL/min (A) (by C-G formula based on SCr of 4.77 mg/dL (H)). Liver Function Tests: Recent Labs  Lab 11/20/20 1536 11/21/20 1427 11/22/20 0444  AST 17 18 17   ALT 14 16 15   ALKPHOS 108 99 93  BILITOT 0.5 0.8 0.8  PROT 7.0 7.0 6.3*  ALBUMIN 3.5 3.3* 2.9*   No results for input(s): LIPASE, AMYLASE in the last 168 hours. No results for input(s): AMMONIA in the last 168 hours. Coagulation Profile: Recent Labs  Lab 11/21/20 1559 11/22/20 0444  INR 2.8* 3.0*   Cardiac Enzymes: No results for input(s): CKTOTAL, CKMB, CKMBINDEX, TROPONINI in the last 168 hours. BNP (last 3 results) No results for input(s): PROBNP in the last 8760 hours. HbA1C: No results for input(s): HGBA1C in the last 72 hours. CBG: No results for input(s):  GLUCAP in the last 168 hours. Lipid Profile: No results for input(s): CHOL, HDL, LDLCALC, TRIG, CHOLHDL, LDLDIRECT in the last 72 hours. Thyroid Function Tests: No results for input(s): TSH, T4TOTAL, FREET4, T3FREE, THYROIDAB in the last 72 hours. Anemia Panel: No results for input(s): VITAMINB12, FOLATE, FERRITIN, TIBC, IRON, RETICCTPCT in the last 72 hours. Sepsis Labs: No results for input(s): PROCALCITON, LATICACIDVEN in the last 168 hours.  Recent Results (from the past 240 hour(s))  Resp Panel by RT-PCR (Flu A&B, Covid) Nasopharyngeal Swab     Status: None   Collection Time: 11/21/20  3:59 PM   Specimen: Nasopharyngeal Swab; Nasopharyngeal(NP) swabs in vial transport medium  Result Value Ref Range Status   SARS Coronavirus 2 by RT PCR NEGATIVE NEGATIVE Final    Comment: (NOTE) SARS-CoV-2 target nucleic acids are NOT DETECTED.  The SARS-CoV-2 RNA is generally detectable in upper respiratory specimens during the acute phase of infection. The lowest concentration of SARS-CoV-2 viral copies this assay can detect is 138 copies/mL. A negative  result does not preclude SARS-Cov-2 infection and should not be used as the sole basis for treatment or other patient management decisions. A negative result may occur with  improper specimen collection/handling, submission of specimen other than nasopharyngeal swab, presence of viral mutation(s) within the areas targeted by this assay, and inadequate number of viral copies(<138 copies/mL). A negative result must be combined with clinical observations, patient history, and epidemiological information. The expected result is Negative.  Fact Sheet for Patients:  EntrepreneurPulse.com.au  Fact Sheet for Healthcare Providers:  IncredibleEmployment.be  This test is no t yet approved or cleared by the [place] FDA and  has been authorized for detection and/or diagnosis of SARS-CoV-2 by FDA under an  Emergency Use Authorization (EUA). This EUA will remain  in effect (meaning this test can be used) for the duration of the COVID-19 declaration under Section 564(b)(1) of the Act, 21 U.S.C.section 360bbb-3(b)(1), unless the authorization is terminated  or revoked sooner.       Influenza A by PCR NEGATIVE NEGATIVE Final   Influenza B by PCR NEGATIVE NEGATIVE Final    Comment: (NOTE) The Xpert Xpress SARS-CoV-2/FLU/RSV plus assay is intended as an aid in the diagnosis of influenza from Nasopharyngeal swab specimens and should not be used as a sole basis for treatment. Nasal washings and aspirates are unacceptable for Xpert Xpress SARS-CoV-2/FLU/RSV testing.  Fact Sheet for Patients: EntrepreneurPulse.com.au  Fact Sheet for Healthcare Providers: IncredibleEmployment.be  This test is not yet approved or cleared by the [place] FDA and has been authorized for detection and/or diagnosis of SARS-CoV-2 by FDA under an Emergency Use Authorization (EUA). This EUA will remain in effect (meaning this test can be used) for the duration of the COVID-19 declaration under Section 564(b)(1) of the Act, 21 U.S.C. section 360bbb-3(b)(1), unless the authorization is terminated or revoked.  Performed at [facility], [address] [place], [place] [address]          Radiology Studies: DG Chest 2 View  Result Date: 11/21/2020 CLINICAL DATA:  Weakness, left rib pain after fall several weeks ago EXAM: CHEST - 2 VIEW COMPARISON:  02/14/2019 FINDINGS: Frontal and lateral views of the chest are obtained. Dual lead pacer is again noted and stable. The cardiac silhouette is unremarkable. There is chronic elevation of the left hemidiaphragm. Trace left pleural fluid. No airspace disease or pneumothorax. There are displaced left lateral seventh and eighth rib fractures noted. No other acute bony abnormalities. IMPRESSION: 1. Minimally displaced left  lateral seventh and eighth rib fractures. 2. Trace left pleural effusion. Electronically Signed   By: Randa Ngo M.D.   On: 11/21/2020 16:02   US Renal  Result Date: 11/21/2020 CLINICAL DATA:  Renal failure EXAM: RENAL / URINARY TRACT ULTRASOUND COMPLETE COMPARISON:  CT abdomen and pelvis 12/10/2016 FINDINGS: Right Kidney: Renal measurements: 8.6 x 4.7 x 4.6 cm = volume: 96 mL. Small circumscribed cysts are demonstrated in the upper pole measuring up to 1.3 cm maximal diameter. No significant parenchymal thinning or hydronephrosis. Left Kidney: Renal measurements: 9.3 x 5 x 5.1 cm = volume: 123 mL. 2.4 cm diameter cyst in the midpole was also present on prior CT. No significant parenchymal thinning or hydronephrosis. Bladder: There is heterogeneous bladder wall thickening with trabeculation likely indicating chronic outflow obstruction. Echogenic structure in the base of the bladder corresponds to abnormality seen at prior CT. There are surgical clips in the area suggesting this could be postoperative. Alternatively, a chronic calcified stone or polypoid structure could  have this appearance. Mild diffuse internal echoes within the bladder suggesting debris. This could represent concentrated urine, hemorrhage, or infection. Correlate with urinalysis. A chronic finding of the echogenic process suggest benign etiology but if stone is suspected, cystoscopy may be useful in further evaluation. Other: None. IMPRESSION: 1. No evidence of hydronephrosis in either kidney. 2. Small bilateral renal cysts. 3. Bladder wall thickening with trabeculation suggesting chronic outflow obstruction. Nonspecific large echogenic structure demonstrated in the base of the bladder which was present on prior CT from 12/10/2016. Diffuse internal echoes in the urine. Correlate with urinalysis. Consider urology consultation. Electronically Signed   By: Lucienne Capers M.D.   On: 11/21/2020 17:55        Scheduled Meds:  amiodarone   100 mg Oral QPM   cyanocobalamin  1,000 mcg Intramuscular Q30 days   dorzolamide-timolol  1 drop Both Eyes BID   levothyroxine  100 mcg Oral QAC breakfast   mirtazapine  15 mg Oral QHS   pantoprazole  40 mg Oral Daily   pravastatin  20 mg Oral q1800   simethicone  80 mg Oral QID   tamsulosin  0.4 mg Oral Daily   Continuous Infusions:   sodium bicarbonate (isotonic) infusion in sterile water 75 mL/hr at 11/22/20 1128     LOS: 1 day    Time spent: 35 minutes    Sidney Ace, MD [facility] Pager [phone]  If 7PM-7AM, please contact night-coverage [email or web address] Password [email or web address] 11/22/2020, 11:30 AM

## 2020-11-22 NOTE — Progress Notes (Signed)
[facility]  ROUNDING NOTE   Subjective:   [name] [name]  presents to the ED after having elevated creatinine and BUN at his PCP's office. Patient is accompanied by his daughter who assists with history taking.   Patient seen sitting up  in bed Completing breakfast Denies GI symptoms Denies shortness of breath Daughter at bedside   Objective:  Vital signs in last 24 hours:  Temp:  [97.5 F (36.4 C)-98.1 F (36.7 C)] 98.1 F (36.7 C) (08/26 0811) Pulse Rate:  [59-67] 60 (08/26 0811) Resp:  [12-20] 17 (08/26 0811) BP: (98-143)/(58-81) 98/58 (08/26 0811) SpO2:  [99 %-100 %] 99 % (08/26 0811) Weight:  [56.6 kg-59.7 kg] 59.7 kg (08/26 0540)  Weight change:  Filed Weights   11/21/20 1420 11/22/20 0540  Weight: 56.6 kg 59.7 kg    Intake/Output: No intake/output data recorded.   Intake/Output this shift:  Total I/O In: 120 [P.O.:120] Out: -   Physical Exam: General: NAD, sitting up in bed  Head: Normocephalic, atraumatic. Moist oral mucosal membranes  Eyes: Anicteric  Lungs:  Clear to auscultation, normal effort  Heart: Regular rate and rhythm  Abdomen:  Soft, nontender  Extremities:  no peripheral edema.  Neurologic: alert, moving all four extremities  Skin: No lesions   Basic Metabolic Panel: Recent Labs  Lab 11/20/20 1536 11/21/20 1427 11/22/20 0444  NA 137 137 137  K 4.4 5.3* 4.1  CL 109 112* 108  CO2 15* 13* 20*  GLUCOSE 123* 131* 138*  BUN 93 Repeated and verified X2.* 96* 89*  CREATININE 4.76 Repeated and verified X2.* 5.05* 4.77*  CALCIUM 8.8 8.9 8.1*  PHOS  --   --  6.8*    Liver Function Tests: Recent Labs  Lab 11/20/20 1536 11/21/20 1427 11/22/20 0444  AST 17 18 17   ALT 14 16 15   ALKPHOS 108 99 93  BILITOT 0.5 0.8 0.8  PROT 7.0 7.0 6.3*  ALBUMIN 3.5 3.3* 2.9*   No results for input(s): LIPASE, AMYLASE in the last 168 hours. No results for input(s): AMMONIA in the last 168 hours.  CBC: Recent Labs  Lab  11/20/20 1536 11/21/20 1427 11/22/20 0444  WBC 6.3 6.6 6.3  NEUTROABS 4.6 4.7  --   HGB 10.8* 10.9* 10.2*  HCT 33.7* 34.8* 31.9*  MCV 100.1* 102.4* 99.7  PLT 238.0 237 224    Cardiac Enzymes: No results for input(s): CKTOTAL, CKMB, CKMBINDEX, TROPONINI in the last 168 hours.  BNP: Invalid input(s): POCBNP  CBG: No results for input(s): GLUCAP in the last 168 hours.  Microbiology: Results for orders placed or performed during the hospital encounter of 11/21/20  Resp Panel by RT-PCR (Flu A&B, Covid) Nasopharyngeal Swab     Status: None   Collection Time: 11/21/20  3:59 PM   Specimen: Nasopharyngeal Swab; Nasopharyngeal(NP) swabs in vial transport medium  Result Value Ref Range Status   SARS Coronavirus 2 by RT PCR NEGATIVE NEGATIVE Final    Comment: (NOTE) SARS-CoV-2 target nucleic acids are NOT DETECTED.  The SARS-CoV-2 RNA is generally detectable in upper respiratory specimens during the acute phase of infection. The lowest concentration of SARS-CoV-2 viral copies this assay can detect is 138 copies/mL. A negative result does not preclude SARS-Cov-2 infection and should not be used as the sole basis for treatment or other patient management decisions. A negative result may occur with  improper specimen collection/handling, submission of specimen other than nasopharyngeal swab, presence of viral mutation(s) within the areas targeted by this  assay, and inadequate number of viral copies(<138 copies/mL). A negative result must be combined with clinical observations, patient history, and epidemiological information. The expected result is Negative.  Fact Sheet for Patients:  EntrepreneurPulse.com.au  Fact Sheet for Healthcare Providers:  IncredibleEmployment.be  This test is no t yet approved or cleared by the [place] FDA and  has been authorized for detection and/or diagnosis of SARS-CoV-2 by FDA under an Emergency Use  Authorization (EUA). This EUA will remain  in effect (meaning this test can be used) for the duration of the COVID-19 declaration under Section 564(b)(1) of the Act, 21 U.S.C.section 360bbb-3(b)(1), unless the authorization is terminated  or revoked sooner.       Influenza A by PCR NEGATIVE NEGATIVE Final   Influenza B by PCR NEGATIVE NEGATIVE Final    Comment: (NOTE) The Xpert Xpress SARS-CoV-2/FLU/RSV plus assay is intended as an aid in the diagnosis of influenza from Nasopharyngeal swab specimens and should not be used as a sole basis for treatment. Nasal washings and aspirates are unacceptable for Xpert Xpress SARS-CoV-2/FLU/RSV testing.  Fact Sheet for Patients: EntrepreneurPulse.com.au  Fact Sheet for Healthcare Providers: IncredibleEmployment.be  This test is not yet approved or cleared by the [place] FDA and has been authorized for detection and/or diagnosis of SARS-CoV-2 by FDA under an Emergency Use Authorization (EUA). This EUA will remain in effect (meaning this test can be used) for the duration of the COVID-19 declaration under Section 564(b)(1) of the Act, 21 U.S.C. section 360bbb-3(b)(1), unless the authorization is terminated or revoked.  Performed at [facility], [address] [place], [place] [address]     Coagulation Studies: Recent Labs    11/21/20 1559 11/22/20 0444  LABPROT 29.9* 31.0*  INR 2.8* 3.0*    Urinalysis: Recent Labs    11/21/20 1600  COLORURINE YELLOW*  LABSPEC 1.009  PHURINE 6.0  GLUCOSEU NEGATIVE  HGBUR MODERATE*  BILIRUBINUR NEGATIVE  KETONESUR NEGATIVE  PROTEINUR 30*  NITRITE NEGATIVE  LEUKOCYTESUR LARGE*      Imaging: DG Chest 2 View  Result Date: 11/21/2020 CLINICAL DATA:  Weakness, left rib pain after fall several weeks ago EXAM: CHEST - 2 VIEW COMPARISON:  02/14/2019 FINDINGS: Frontal and lateral views of the chest are obtained. Dual lead pacer is again  noted and stable. The cardiac silhouette is unremarkable. There is chronic elevation of the left hemidiaphragm. Trace left pleural fluid. No airspace disease or pneumothorax. There are displaced left lateral seventh and eighth rib fractures noted. No other acute bony abnormalities. IMPRESSION: 1. Minimally displaced left lateral seventh and eighth rib fractures. 2. Trace left pleural effusion. Electronically Signed   By: Randa Ngo M.D.   On: 11/21/2020 16:02   US Renal  Result Date: 11/21/2020 CLINICAL DATA:  Renal failure EXAM: RENAL / URINARY TRACT ULTRASOUND COMPLETE COMPARISON:  CT abdomen and pelvis 12/10/2016 FINDINGS: Right Kidney: Renal measurements: 8.6 x 4.7 x 4.6 cm = volume: 96 mL. Small circumscribed cysts are demonstrated in the upper pole measuring up to 1.3 cm maximal diameter. No significant parenchymal thinning or hydronephrosis. Left Kidney: Renal measurements: 9.3 x 5 x 5.1 cm = volume: 123 mL. 2.4 cm diameter cyst in the midpole was also present on prior CT. No significant parenchymal thinning or hydronephrosis. Bladder: There is heterogeneous bladder wall thickening with trabeculation likely indicating chronic outflow obstruction. Echogenic structure in the base of the bladder corresponds to abnormality seen at prior CT. There are surgical clips in the area suggesting this could be  postoperative. Alternatively, a chronic calcified stone or polypoid structure could have this appearance. Mild diffuse internal echoes within the bladder suggesting debris. This could represent concentrated urine, hemorrhage, or infection. Correlate with urinalysis. A chronic finding of the echogenic process suggest benign etiology but if stone is suspected, cystoscopy may be useful in further evaluation. Other: None. IMPRESSION: 1. No evidence of hydronephrosis in either kidney. 2. Small bilateral renal cysts. 3. Bladder wall thickening with trabeculation suggesting chronic outflow obstruction. Nonspecific  large echogenic structure demonstrated in the base of the bladder which was present on prior CT from 12/10/2016. Diffuse internal echoes in the urine. Correlate with urinalysis. Consider urology consultation. Electronically Signed   By: Lucienne Capers M.D.   On: 11/21/2020 17:55     Medications:     sodium bicarbonate (isotonic) infusion in sterile water 75 mL/hr at 11/22/20 1128    amiodarone  100 mg Oral QPM   cyanocobalamin  1,000 mcg Intramuscular Q30 days   dorzolamide-timolol  1 drop Both Eyes BID   feeding supplement  237 mL Oral TID BM   levothyroxine  100 mcg Oral QAC breakfast   mirtazapine  15 mg Oral QHS   multivitamin with minerals  1 tablet Oral Daily   pantoprazole  40 mg Oral Daily   pravastatin  20 mg Oral q1800   simethicone  80 mg Oral QID   tamsulosin  0.4 mg Oral Daily   acetaminophen **OR** acetaminophen, calcium carbonate, camphor-menthol **AND** hydrOXYzine, docusate sodium, docusate sodium, hydrALAZINE, loperamide, ondansetron **OR** ondansetron (ZOFRAN) IV, polyethylene glycol, sorbitol, traMADol, zolpidem  Assessment/ Plan:  [name] [name] is a 87 y.o.  male  presents to the ED after having elevated creatinine and BUN at his PCP's office.   Acute Kidney Injury with hematuria and proteinuria on chronic kidney disease stage 4 with baseline creatinine 2.31 and GFR of 23 on 09/11/20.  Acute kidney injury secondary to prerenal azotemia from poor PO intake with continued furosemide and obstructive uropathy.  Reduced IVF to sodium bicarbonate 75 ml/hr No indication for dialysis at this time Continue holding diuretic Urology consulted for obstructive uropathy  Lab Results  Component Value Date   CREATININE 4.77 (H) 11/22/2020   CREATININE 5.05 (H) 11/21/2020   CREATININE 4.76 Repeated and verified X2. ([facility]) 11/20/2020    Intake/Output Summary (Last 24 hours) at 11/22/2020 1155 Last data filed at 11/22/2020 0900 Gross per 24 hour  Intake 120 ml  Output  --  Net 120 ml   2. Hyperkalemia: 5.3 on admission Currently 4.1  3. Metabolic acidosis Continue IVF    LOS: 1 Jacari Iannello 8/26/202211:55 AM

## 2020-11-22 NOTE — Evaluation (Signed)
Occupational Therapy Evaluation Patient Details Name: [name] [name] MRN: [id number] DOB: [birth year] Today's Date: 11/22/2020    History of Present Illness [name] [name] is a 87 y.o. male with medical history significant of essential hypertension, chronic kidney disease stage IV, symptomatic bradycardia with pacemaker in place, hypertension lipidemia, prostate cancer, paroxysmal atrial fibrillation, diverticular disease GERD presenting for acute on chronic kidney failure.   Clinical Impression   [name] [name] was seen for OT evaluation this date. Prior to hospital admission, pt was MOD I for mobility and ADLs using SPC or RW as needed. Pt lives with daughter available PRN in home c 4 STE. Pt presents to acute OT demonstrating impaired ADL performance and functional mobility 2/2 decreased activity tolerance, poor insight into deficits, and functional strength/ROM/balance deficits. Pt currently requires MOD A don B socks seated EOB - pt attempted for ~2 min prior to asking for assistance. Pt requires BUE support for standing balance, unable to progress to standing grooming this date. Pt would benefit from skilled OT to address noted impairments and functional limitations (see below for any additional details) in order to maximize safety and independence while minimizing falls risk and caregiver burden. Upon hospital discharge, recommend STR to maximize pt safety and return to PLOF.     Follow Up Recommendations  SNF    Equipment Recommendations  Other (comment) (TBD)    Recommendations for Other Services       Precautions / Restrictions Precautions Precautions: Fall Restrictions Weight Bearing Restrictions: No      Mobility Bed Mobility Overal bed mobility: Needs Assistance Bed Mobility: Supine to Sit;Sit to Supine     Supine to sit: Min guard Sit to supine: Min guard   General bed mobility comments: increased time    Transfers Overall transfer level: Needs  assistance Equipment used: Rolling walker (2 wheeled) Transfers: Sit to/from Stand Sit to Stand: Mod assist;From elevated surface              Balance Overall balance assessment: Needs assistance Sitting-balance support: No upper extremity supported;Feet supported Sitting balance-Leahy Scale: Fair     Standing balance support: Bilateral upper extremity supported Standing balance-Leahy Scale: Poor                             ADL either performed or assessed with clinical judgement   ADL Overall ADL's : Needs assistance/impaired                                       General ADL Comments: MOD A don B socks seated EOB - pt attempted for ~2 min prior to asking for assistance. Pt requires BUE support for standing balance, unable to progress to standing grooming this date      Pertinent Vitals/Pain Pain Assessment: No/denies pain     Hand Dominance     Extremity/Trunk Assessment Upper Extremity Assessment Upper Extremity Assessment: Generalized weakness   Lower Extremity Assessment Lower Extremity Assessment: Generalized weakness       Communication Communication Communication: No difficulties   Cognition Arousal/Alertness: Awake/alert Behavior During Therapy: WFL for tasks assessed/performed Overall Cognitive Status: No family/caregiver present to determine baseline cognitive functioning  General Comments       Exercises Exercises: Other exercises Other Exercises Other Exercises: Pt educated re: OT role, DME recs, d/c recs, falls prevention, ECS Other Exercises: LBD, sup<>sit, sit<>stand, sitting/standing balance/tolerance, side steps   Shoulder Instructions      Home Living Family/patient expects to be discharged to:: Private residence Living Arrangements: Children Available Help at Discharge: Family;Available PRN/intermittently Type of Home: House Home Access: Stairs to  enter Entrance Stairs-Number of Steps: 4   Home Layout: One level               Home Equipment: Walker - 2 wheels;Cane - single point          Prior Functioning/Environment Level of Independence: Independent with assistive device(s)                 OT Problem List: Decreased strength;Decreased activity tolerance;Impaired balance (sitting and/or standing);Decreased safety awareness      OT Treatment/Interventions: Self-care/ADL training;Therapeutic exercise;Energy conservation;DME and/or AE instruction;Therapeutic activities;Patient/family education;Balance training    OT Goals(Current goals can be found in the care plan section) Acute Rehab OT Goals Patient Stated Goal: to go home OT Goal Formulation: With patient Time For Goal Achievement: 12/16/2020 Potential to Achieve Goals: Good ADL Goals Pt Will Perform Grooming: with supervision;standing (c LRAD PRN) Pt Will Perform Lower Body Dressing: with min guard assist;sitting/lateral leans Pt Will Transfer to Toilet: with supervision;ambulating;bedside commode (c LRAD PRN)  OT Frequency: Min 2X/week   Barriers to D/C: Decreased caregiver support          Co-evaluation              AM-PAC OT "6 Clicks" Daily Activity     Outcome Measure Help from another person eating meals?: None Help from another person taking care of personal grooming?: A Lot Help from another person toileting, which includes using toliet, bedpan, or urinal?: A Little Help from another person bathing (including washing, rinsing, drying)?: A Lot Help from another person to put on and taking off regular upper body clothing?: A Little Help from another person to put on and taking off regular lower body clothing?: A Lot 6 Click Score: 16   End of Session    Activity Tolerance: Patient tolerated treatment well;Patient limited by fatigue Patient left: in bed;with call bell/phone within reach;with bed alarm set  OT Visit Diagnosis:  Unsteadiness on feet (R26.81);Muscle weakness (generalized) (M62.81)                Time: 5638-9373 OT Time Calculation (min): 14 min Charges:  OT General Charges $OT Visit: 1 Visit OT Evaluation $OT Eval Moderate Complexity: 1 Mod  Dessie Coma, M.S. OTR/L  11/22/20, 4:32 PM  ascom [phone]

## 2020-11-22 NOTE — Evaluation (Signed)
Physical Therapy Evaluation Patient Details Name: [name] [name] MRN: [id number] DOB: Aug 30, 1922 Today's Date: 11/22/2020   History of Present Illness  87 yo male with onset of many falls was admitted with worsening renal function but had recent rib fractures on L side 7&8 with pneumothorax, now has pleural effusion and abnormal lab values.  Has suspected UTI, malnutrition, incontinence, general weakness.  PMHx:  HTN, CKD 4, bradycardia, pacemaker, prostate CA, PAF, diverticular disease, GERD,  Clinical Impression  Pt is assessed today and was weak and limited in endurance, yet motivated to try to work.  He is noting some limited energy to sit up on side of bed, but also struggling to lift his hand off the bed to walker once up.  Pt  will be concentrating on his LE strength, standing balance and tolerance for gait with upcoming therapy, and will communicate with discharge planning staff about expectations to go to SNF.  He is not safe and independent enough for home even with help, due to his inability to stand well and low standing endurance.  Focus on goals of acute PT.    Follow Up Recommendations SNF    Equipment Recommendations  None recommended by PT    Recommendations for Other Services       Precautions / Restrictions Precautions Precautions: Fall Precaution Comments: monitor BP Restrictions Weight Bearing Restrictions: No      Mobility  Bed Mobility Overal bed mobility: Needs Assistance Bed Mobility: Supine to Sit;Sit to Supine     Supine to sit: Min guard Sit to supine: Min guard   General bed mobility comments: increased time    Transfers Overall transfer level: Needs assistance Equipment used: Rolling walker (2 wheeled) Transfers: Sit to/from Stand Sit to Stand: Mod assist;From elevated surface            Ambulation/Gait             General Gait Details: unable to take steps  Stairs            Wheelchair Mobility    Modified Rankin  (Stroke Patients Only)       Balance Overall balance assessment: Needs assistance Sitting-balance support: No upper extremity supported;Feet supported Sitting balance-Leahy Scale: Fair     Standing balance support: Bilateral upper extremity supported Standing balance-Leahy Scale: Poor                               Pertinent Vitals/Pain Pain Assessment: No/denies pain    Home Living Family/patient expects to be discharged to:: Private residence Living Arrangements: Children Available Help at Discharge: Family;Available PRN/intermittently Type of Home: House Home Access: Stairs to enter Entrance Stairs-Rails: Psychiatric nurse of Steps: 4 Home Layout: One level Home Equipment: Walker - 2 wheels;Cane - single point      Prior Function Level of Independence: Independent with assistive device(s)               Hand Dominance   Dominant Hand: Right    Extremity/Trunk Assessment   Upper Extremity Assessment Upper Extremity Assessment: Defer to OT evaluation    Lower Extremity Assessment Lower Extremity Assessment: Generalized weakness       Communication   Communication: No difficulties  Cognition Arousal/Alertness: Awake/alert Behavior During Therapy: WFL for tasks assessed/performed Overall Cognitive Status: No family/caregiver present to determine baseline cognitive functioning  General Comments      Exercises Other Exercises Other Exercises: Pt educated re: OT role, DME recs, d/c recs, falls prevention, ECS Other Exercises: LBD, sup<>sit, sit<>stand, sitting/standing balance/tolerance, side steps   Assessment/Plan    PT Assessment Patient needs continued PT services  PT Problem List Decreased strength;Decreased range of motion;Decreased activity tolerance;Decreased balance;Decreased mobility;Decreased coordination;Decreased cognition;Decreased knowledge of use of  DME;Decreased safety awareness;Cardiopulmonary status limiting activity       PT Treatment Interventions DME instruction;Gait training;Stair training;Functional mobility training;Therapeutic activities;Therapeutic exercise;Balance training;Neuromuscular re-education;Patient/family education    PT Goals (Current goals can be found in the Care Plan section)  Acute Rehab PT Goals Patient Stated Goal: to go home PT Goal Formulation: With patient Time For Goal Achievement: 12/12/2020 Potential to Achieve Goals: Good    Frequency Min 2X/week   Barriers to discharge Inaccessible home environment;Decreased caregiver support home with independence previously,  stairs to enter    Co-evaluation               AM-PAC PT "6 Clicks" Mobility  Outcome Measure                  End of Session Equipment Utilized During Treatment: Gait belt Activity Tolerance: Patient limited by fatigue Patient left: in bed;with call bell/phone within reach;with bed alarm set Nurse Communication: Mobility status PT Visit Diagnosis: Unsteadiness on feet (R26.81);Muscle weakness (generalized) (M62.81);Repeated falls (R29.6);Difficulty in walking, not elsewhere classified (R26.2)    Time: 2330-0762 PT Time Calculation (min) (ACUTE ONLY): 36 min   Charges:   PT Evaluation $PT Eval Moderate Complexity: 1 Mod PT Treatments $Therapeutic Activity: 8-22 mins       Ramond Dial 11/22/2020, 4:39 PM  Mee Hives, PT MS Acute Rehab Dept. Number: [place] [facility] [place]

## 2020-11-22 NOTE — Progress Notes (Signed)
Initial Nutrition Assessment  DOCUMENTATION CODES:  Severe malnutrition in context of chronic illness  INTERVENTION:  Liberalize diet to regular.  Discontinue Nepro shakes TID.  Add Ensure Plus po TID, each supplement provides 350 kcal and 13 grams of protein.   Add Magic cup TID with meals, each supplement provides 290 kcal and 9 grams of protein.  Add MVI with minerals daily.  NUTRITION DIAGNOSIS:  Severe Malnutrition related to chronic illness, cancer and cancer related treatments (CKD stage 4) as evidenced by severe fat depletion, severe muscle depletion.  GOAL:  Patient will meet greater than or equal to 90% of their needs  MONITOR:  PO intake, Supplement acceptance, Diet advancement, Labs, Weight trends, I & O's  REASON FOR ASSESSMENT:  Malnutrition Screening Tool    ASSESSMENT:  87 yo male with a PMH of essential HTN, CKD stage IV, symptomatic bradycardia with pacemaker in place, HLD, prostate cancer, paroxysmal atrial fibrillation, diverticular disease, GERD, and multiple other medical problems. Patient presented the ER secondary to having abnormal labs.  His daughter brought him in and mentioned that he has been gradually declining over the last couple of weeks.  Patient has had multiple falls in the last 1 month.  He had small rib fracture with pneumothorax.  Was on the service cardiovascular surgery at the time. He was found to have worsening renal function although he had chronic kidney disease stage III at the time.  His intake has been declining.  He is also not been himself. Suspicion for UTI and possible obstructive uropathy was made.  He is being admitted to the hospital for treatment of acute on chronic kidney failure.  Spoke with pt at bedside with daughter. Pt reports having a good appetite at home until the last 2-3 days PTA. Daughter reports he ate almost all of his breakfast this morning and his appetite is starting to get back to baseline.  Per Epic, pt's  weight appears to be increased from previous admissions over the past month, however, pt does have mild edema BLE. Daughter reports pt has a more recent UBW of 124 lbs. It has been this for 3 weeks now. His weight has been very slowly declining over the past few years.  On exam, pt with moderate to severe depletions.  Recommend adding Ensure Enlive/Plus TID and Magic Cup TID, as well as MVI with minerals daily.  Also recommend liberalizing diet to regular given age and severe malnutrition. Secure chatted MD regarding this.  Medications: reviewed; Vitamin B12, Synthroid, Remeron, Protonix, Warfarin, Na Bicarb 150 mEq @ 125 ml/hr  Labs: reviewed; Glucose 138 (H), Phos 6.8 (H)  NUTRITION - FOCUSED PHYSICAL EXAM: Flowsheet Row Most Recent Value  Orbital Region Severe depletion  Upper Arm Region Severe depletion  Thoracic and Lumbar Region Moderate depletion  Buccal Region Moderate depletion  Temple Region Severe depletion  Clavicle Bone Region Severe depletion  Clavicle and Acromion Bone Region Severe depletion  Scapular Bone Region Severe depletion  Dorsal Hand Severe depletion  Patellar Region Moderate depletion  Anterior Thigh Region Moderate depletion  Posterior Calf Region Moderate depletion  Edema (RD Assessment) Mild  [BLE]  Hair Reviewed  Eyes Reviewed  Mouth Reviewed  Skin Reviewed  Nails Reviewed   Diet Order:   Diet Order             Diet regular Room service appropriate? Yes; Fluid consistency: Thin  Diet effective now  EDUCATION NEEDS:  Education needs have been addressed  Skin:  Skin Assessment: Reviewed RN Assessment (Ecchymosis, abrasion)  Last BM:  11/22/20 - Type 5, brown/black, small  Height:  Ht Readings from Last 1 Encounters:  11/21/20 5\' 8"  (1.727 m)   Weight:  Wt Readings from Last 1 Encounters:  11/22/20 59.7 kg   BMI:  Body mass index is 20.01 kg/m.  Estimated Nutritional Needs:  Kcal:  1850-2050 Protein:  75-90  grams Fluid:  >1.85 L  Derrel Nip, RD, LDN (she/her/hers) Registered Dietitian I After-Hours/Weekend Pager # in [place]

## 2020-11-22 NOTE — Consult Note (Signed)
Urology Consult   I have been asked to see the patient by Dr. Priscella Mann, for evaluation and management of urethral stricture, AKI.  Chief Complaint: Renal failure, urethral stricture  HPI:  [name] [name] is a 87 y.o. year old male who has been followed by Dr. Bernardo Heater for a complex urologic history including known erosion of mesh into the bladder and recurrent urethral stricture disease.  Most recently, was found to have recurrence of a <91F bulbourethral stricture on clinic cystoscopy with Dr. Bernardo Heater on 10/17/2020.  He has had 10 to 12 weeks of mild to moderate dysuria and weaker stream.  Dr. Bernardo Heater was planning for OptiLume balloon dilation in the OR, but this was canceled secondary to elevated INR in the fall.  He is also had a history of colonization of the urine without UTI symptoms.  He was admitted overnight with worsening renal function and overall decline/weakness and noted to have worsening renal function from his baseline CKD.  A renal ultrasound was performed and showed no hydronephrosis, and renal function is improved slightly this morning with hydration to 4.7 from 5 on admission.  INR significantly elevated at 3, and he is on Coumadin for history of atrial fibrillation.  He is currently voiding spontaneously clear yellow urine, he has his baseline weak stream and dysuria.  PMH: Past Medical History:  Diagnosis Date   Anemia    Aortic atherosclerosis ([place])    Balance problems    uses walker   Cardiac pacemaker in situ 07/2005   symptomatic bradycardia   CKD (chronic kidney disease) stage 3, GFR 30-59 ml/min ([name]) 04/13/2017   Closed right hip fracture, initial encounter ([place]) 02/14/2019   Diverticulosis of colon (without mention of hemorrhage)    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    GERD (gastroesophageal reflux disease)    Glaucoma    Hyperlipidemia    Hypertension    Neck arthritis, Severe, multi-level 06/09/2013   Paroxysmal atrial fibrillation ([name])     s/p failed ablation   Prostate cancer ([place]) 12/2000   5 weeks XRT + brachytherapy   Radiation proctitis    Sinoatrial node dysfunction ([name])    Skin cancer    Skin cancer of eyelid, left 10/2007   Unspecified glaucoma(365.9)    Unspecified hypothyroidism    Vitamin B12 deficiency     Surgical History: Past Surgical History:  Procedure Laterality Date   [place] AND ABLATION  2001, 2003   failure   CATARACT EXTRACTION     CYSTOGRAM N/A 09/21/2018   Procedure: CYSTOGRAM;  Surgeon: Abbie Sons, MD;  Location: [facility];  Service: Urology;  Laterality: N/A;   CYSTOSCOPY WITH URETHRAL DILATATION N/A 01/29/2017   Procedure: CYSTOSCOPY WITH URETHRAL DILATATION;  Surgeon: Abbie Sons, MD;  Location: [facility];  Service: Urology;  Laterality: N/A;   CYSTOSCOPY WITH URETHRAL DILATATION N/A 09/21/2018   Procedure: CYSTOSCOPY WITH URETHRAL DILATATION;  Surgeon: Abbie Sons, MD;  Location: [facility];  Service: Urology;  Laterality: N/A;   CYSTOURETHROSCOPY N/A 11/07/2014   EP IMPLANTABLE DEVICE N/A 02/24/2016   Procedure: PPM Generator Changeout;  Surgeon: Deboraha Sprang, MD;  Location: [facility];  Service: Cardiovascular;  Laterality: N/A;   gastric ulcer surgery     HERNIA REPAIR  1994, 2001, 2003   s/p drainage and complications, 04/5850, 09/7822 mesh removal   HIP ARTHROPLASTY Right 02/16/2019   Procedure: RIGHT ANTERIOR HIP HEMIARTHROPLASTY,CEMENTED;  Surgeon: Hessie Knows, MD;  Location: [facility];  Service: Orthopedics;  Laterality: Right;   INSERT / REPLACE / REMOVE PACEMAKER  07/2005   symptomatic bradycardia   kidney stones     NOSE SURGERY     cancer removed    PROSTATE SURGERY     SKIN CANCER DESTRUCTION     TOOTH EXTRACTION      Allergies:  Allergies  Allergen Reactions   Penicillins Rash and Other (See Comments)    Has patient had a PCN reaction causing immediate rash, facial/tongue/throat  swelling, SOB or lightheadedness with hypotension: {no Has patient had a PCN reaction causing severe rash involving mucus membranes or skin necrosis: {no Has patient had a PCN reaction that required hospitalization {no Has patient had a PCN reaction occurring within the last 10 years: {no If all of the above answers are "NO", then may proceed with Cephalosporin use.    Family History: Family History  Problem Relation Age of Onset   Breast cancer Mother    Bone cancer Father    Alcohol abuse Other    Coronary artery disease Other    Dementia Other    Diabetes Other     Social History:  reports that he has never smoked. He has never used smokeless tobacco. He reports that he does not drink alcohol and does not use drugs.  ROS: Negative aside from those stated in the HPI.  Physical Exam: BP (!) 98/58 (BP Location: Right Arm)   Pulse 60   Temp 98.1 F (36.7 C)   Resp 17   Ht 5\' 8"  (1.727 m)   Wt 59.7 kg   SpO2 99%   BMI 20.01 kg/m    Constitutional: Alert, frail-appearing, conversational Cardiovascular: No clubbing, cyanosis, or edema. Respiratory: Normal respiratory effort, no increased work of breathing. GI: Abdomen is soft, nontender, nondistended, no abdominal masses GU: External drainage bag over the penis with clear yellow urine   Laboratory Data: Reviewed, see HPI  Pertinent Imaging: I have personally reviewed the recent CT and renal ultrasound that showed no hydronephrosis.  Assessment & Plan:   Comorbid 87 year old male with long history of urethral stricture disease as well as likely mesh erosion into the bladder that is chronic who has recurrence of a known <58F bulbourethral stricture.  He continues to void spontaneously and has no lower abdominal pain, no hydronephrosis.  AKI likely multifactorial.  With his significantly elevated INR of 3, I did not recommend urgent urethral dilation and catheter placement or balloon dilation as I think the risks of  bleeding outweigh the benefits at this time with him spontaneously voiding, no lower abdominal pain, and no hydronephrosis.  I had a long conversation with patient and his daughter about options, and they are concerned about being discharged prior to urethral dilation with his overall decline and previously canceled surgery from elevated INR and fall. I think it is reasonable for cysto and optilume ballon dilation to be performed inpatient, but will need to hold coumadin for INR to normalize  Recommendations: Hold coumadin, continue to monitor INR Tenatively plan for OR Monday or Tuesday for cystoscopy and balloon dilation with Optilume balloon/possible DVIU  Billey Co, MD  Total time spent on the floor was 57minutes, with greater than 50% spent in counseling and coordination of care with the patient regarding urethral stricture, AKI, and treatment options  [facility] [address], [place], [place] [address] [phone]

## 2020-11-23 DIAGNOSIS — E43 Unspecified severe protein-calorie malnutrition: Secondary | ICD-10-CM | POA: Insufficient documentation

## 2020-11-23 LAB — PROTIME-INR
INR: 3 — ABNORMAL HIGH (ref 0.8–1.2)
Prothrombin Time: 31.2 seconds — ABNORMAL HIGH (ref 11.4–15.2)

## 2020-11-23 LAB — GASTROINTESTINAL PANEL BY PCR, STOOL (REPLACES STOOL CULTURE)

## 2020-11-23 LAB — CBC WITH DIFFERENTIAL/PLATELET
Abs Immature Granulocytes: 0.02 10*3/uL (ref 0.00–0.07)
Basophils Absolute: 0 10*3/uL (ref 0.0–0.1)
Basophils Relative: 0 %
Eosinophils Absolute: 0.2 10*3/uL (ref 0.0–0.5)
Eosinophils Relative: 3 %
HCT: 33.1 % — ABNORMAL LOW (ref 39.0–52.0)
Hemoglobin: 11 g/dL — ABNORMAL LOW (ref 13.0–17.0)
Immature Granulocytes: 0 %
Lymphocytes Relative: 21 %
Lymphs Abs: 1.4 10*3/uL (ref 0.7–4.0)
MCH: 32.4 pg (ref 26.0–34.0)
MCHC: 33.2 g/dL (ref 30.0–36.0)
MCV: 97.6 fL (ref 80.0–100.0)
Monocytes Absolute: 0.9 10*3/uL (ref 0.1–1.0)
Monocytes Relative: 13 %
Neutro Abs: 4.3 10*3/uL (ref 1.7–7.7)
Neutrophils Relative %: 63 %
Platelets: 211 10*3/uL (ref 150–400)
RBC: 3.39 MIL/uL — ABNORMAL LOW (ref 4.22–5.81)
RDW: 15.6 % — ABNORMAL HIGH (ref 11.5–15.5)
WBC: 6.9 10*3/uL (ref 4.0–10.5)
nRBC: 0 % (ref 0.0–0.2)

## 2020-11-23 LAB — C DIFFICILE QUICK SCREEN W PCR REFLEX
C Diff antigen: NEGATIVE
C Diff interpretation: NOT DETECTED
C Diff toxin: NEGATIVE

## 2020-11-23 LAB — BASIC METABOLIC PANEL
Anion gap: 11 (ref 5–15)
BUN: 83 mg/dL — ABNORMAL HIGH (ref 8–23)
CO2: 26 mmol/L (ref 22–32)
Calcium: 8.3 mg/dL — ABNORMAL LOW (ref 8.9–10.3)
Chloride: 102 mmol/L (ref 98–111)
Creatinine, Ser: 4.6 mg/dL — ABNORMAL HIGH (ref 0.61–1.24)
GFR, Estimated: 11 mL/min — ABNORMAL LOW (ref >60)
Glucose, Bld: 102 mg/dL — ABNORMAL HIGH (ref 70–99)
Potassium: 3.4 mmol/L — ABNORMAL LOW (ref 3.5–5.1)
Sodium: 139 mmol/L (ref 135–145)

## 2020-11-23 MED ORDER — CHOLESTYRAMINE LIGHT 4 G PO PACK
4.0000 g | PACK | Freq: Two times a day (BID) | ORAL | Status: DC | PRN
  Filled 2020-11-23: qty 1

## 2020-11-23 MED ORDER — SODIUM CHLORIDE 0.9 % IV SOLN: INTRAVENOUS | Status: DC

## 2020-11-23 NOTE — Progress Notes (Signed)
PROGRESS NOTE    [name] [name]  MVH:[id number] DOB: 1922-10-01 DOA: 11/21/2020 PCP: Owens Loffler, MD    Brief Narrative:  87 y.o. male with medical history significant of essential hypertension, chronic kidney disease stage IV, symptomatic bradycardia with pacemaker in place, hypertension lipidemia, prostate cancer, paroxysmal atrial fibrillation, diverticular disease GERD and multiple other medical problems who is currently on Coumadin.  Patient presented the ER secondary to having abnormal labs.  His daughter brought him in and mentioned that he has been gradually declining over the last couple of weeks.  Patient has had multiple falls in the last 1 month.  He had small rib fracture with pneumothorax.  Was on the service cardiovascular surgery at the time.  Patient continue to follow with PCP.  He was found to have worsening renal function although he had chronic kidney disease stage III at the time.  His intake has been declining.  He is also not been himself.  He did mention that he had dysuria with some abdominal flank.  No hematuria.  No diarrhea no vomiting.  Patient now has worsening renal failure with creatinine more than 5.  Admitted to the medicine service with nephrology and urology aware.  AKI likely multifactorial.  Per urology obstructive uropathy and associated AKI is unlikely due to lack of hydronephrosis on imaging.  Patient's creatinine is improving while on IV fluids.  Urology planning tentatively for cystoscopy, dilatation and Foley placement in operating room early next week   Assessment & Plan:   Principal Problem:   AKI (acute kidney injury) ([place]) Active Problems:   Hypothyroidism   Hyperlipidemia   Essential hypertension   GERD   Prostate cancer, in remission   PACEMAKER, PERMANENT   Long term (current) use of anticoagulants   Longstanding persistent atrial fibrillation ([name])   Chronic kidney disease (CKD), stage IV (severe) ([name])   Protein-calorie  malnutrition, severe  AKI on CKD stage IIIb Multifactorial, prerenal versus obstructive uropathy Poor p.o. intake, recent hospitalizations Plan: Continue bicarbonate infusion, rate decreased to 75 cc/h Monitor kidney function Continue Flomax Appreciate nephrology input Appreciate urology input Tentative plan for cystoscopy, dilatation, Foley placement in OR early next week Coumadin on hold  Paroxysmal atrial fibrillation Currently controlled on amiodarone Was therapeutic on warfarin Coumadin on hold in anticipation of surgical procedure May need vitamin K/FFP if INR remains elevated  Possible UTI Leukocytes on UA No other clinical indications of infection Suspect asymptomatic bacteriuria No indication for antibiotics at this time  Essential hypertension Lasix on hold As needed hydralazine  GERD PPI  Hyperlipidemia Pravastatin  Hypothyroidism Synthroid   DVT prophylaxis: SCDs Code Status: Full Family Communication: Daughter at bedside 8/26, 827 Disposition Plan: Status is: Inpatient  Remains inpatient appropriate because:Inpatient level of care appropriate due to severity of illness  Dispo: The patient is from: Home              Anticipated d/c is to: Home              Patient currently is not medically stable to d/c.   Difficult to place patient No       Level of care: Med-Surg  Consultants:  Nephrology Urology  Procedures: None  Antimicrobials:  None   Subjective: Seen and examined.  Sitting up in bed eating breakfast.  No visible distress.  Objective: Vitals:   11/22/20 1954 11/23/20 0500 11/23/20 0514 11/23/20 0813  BP: (!) 102/59  138/75 115/66  Pulse: 83  61 79  Resp: 15  16 17  Temp: 97.6 F (36.4 C)  98.4 F (36.9 C) 98.2 F (36.8 C)  TempSrc: Oral  Oral   SpO2: 95%  95% 96%  Weight:  61 kg    Height:        Intake/Output Summary (Last 24 hours) at 11/23/2020 1054 Last data filed at 11/23/2020 0900 Gross per 24 hour   Intake 2481.02 ml  Output 2000 ml  Net 481.02 ml   Filed Weights   11/21/20 1420 11/22/20 0540 11/23/20 0500  Weight: 56.6 kg 59.7 kg 61 kg    Examination:  General exam: Appears calm and comfortable  Respiratory system: Lungs clear.  Normal work of breathing.  Room air Cardiovascular system: S1-S2, regular rate and rhythm, 2/6 murmur, no pedal edema  gastrointestinal system: Soft, nontender, nondistended, normal bowel sounds Central nervous system: Alert and oriented. No focal neurological deficits. Extremities: Symmetric 5 x 5 power. Skin: No rashes, lesions or ulcers Psychiatry: Judgement and insight appear normal. Mood & affect appropriate.     Data Reviewed: I have personally reviewed following labs and imaging studies  CBC: Recent Labs  Lab 11/20/20 1536 11/21/20 1427 11/22/20 0444 11/23/20 0744  WBC 6.3 6.6 6.3 6.9  NEUTROABS 4.6 4.7  --  4.3  HGB 10.8* 10.9* 10.2* 11.0*  HCT 33.7* 34.8* 31.9* 33.1*  MCV 100.1* 102.4* 99.7 97.6  PLT 238.0 237 224 242   Basic Metabolic Panel: Recent Labs  Lab 11/20/20 1536 11/21/20 1427 11/22/20 0444 11/23/20 0744  NA 137 137 137 139  K 4.4 5.3* 4.1 3.4*  CL 109 112* 108 102  CO2 15* 13* 20* 26  GLUCOSE 123* 131* 138* 102*  BUN 93 Repeated and verified X2.* 96* 89* 83*  CREATININE 4.76 Repeated and verified X2.* 5.05* 4.77* 4.60*  CALCIUM 8.8 8.9 8.1* 8.3*  PHOS  --   --  6.8*  --    GFR: Estimated Creatinine Clearance: 8.3 mL/min (A) (by C-G formula based on SCr of 4.6 mg/dL (H)). Liver Function Tests: Recent Labs  Lab 11/20/20 1536 11/21/20 1427 11/22/20 0444  AST 17 18 17   ALT 14 16 15   ALKPHOS 108 99 93  BILITOT 0.5 0.8 0.8  PROT 7.0 7.0 6.3*  ALBUMIN 3.5 3.3* 2.9*   No results for input(s): LIPASE, AMYLASE in the last 168 hours. No results for input(s): AMMONIA in the last 168 hours. Coagulation Profile: Recent Labs  Lab 11/21/20 1559 11/22/20 0444 11/23/20 0950  INR 2.8* 3.0* 3.0*   Cardiac  Enzymes: No results for input(s): CKTOTAL, CKMB, CKMBINDEX, TROPONINI in the last 168 hours. BNP (last 3 results) No results for input(s): PROBNP in the last 8760 hours. HbA1C: No results for input(s): HGBA1C in the last 72 hours. CBG: No results for input(s): GLUCAP in the last 168 hours. Lipid Profile: No results for input(s): CHOL, HDL, LDLCALC, TRIG, CHOLHDL, LDLDIRECT in the last 72 hours. Thyroid Function Tests: No results for input(s): TSH, T4TOTAL, FREET4, T3FREE, THYROIDAB in the last 72 hours. Anemia Panel: No results for input(s): VITAMINB12, FOLATE, FERRITIN, TIBC, IRON, RETICCTPCT in the last 72 hours. Sepsis Labs: No results for input(s): PROCALCITON, LATICACIDVEN in the last 168 hours.  Recent Results (from the past 240 hour(s))  Resp Panel by RT-PCR (Flu A&B, Covid) Nasopharyngeal Swab     Status: None   Collection Time: 11/21/20  3:59 PM   Specimen: Nasopharyngeal Swab; Nasopharyngeal(NP) swabs in vial transport medium  Result Value Ref Range Status   SARS Coronavirus 2 by  RT PCR NEGATIVE NEGATIVE Final    Comment: (NOTE) SARS-CoV-2 target nucleic acids are NOT DETECTED.  The SARS-CoV-2 RNA is generally detectable in upper respiratory specimens during the acute phase of infection. The lowest concentration of SARS-CoV-2 viral copies this assay can detect is 138 copies/mL. A negative result does not preclude SARS-Cov-2 infection and should not be used as the sole basis for treatment or other patient management decisions. A negative result may occur with  improper specimen collection/handling, submission of specimen other than nasopharyngeal swab, presence of viral mutation(s) within the areas targeted by this assay, and inadequate number of viral copies(<138 copies/mL). A negative result must be combined with clinical observations, patient history, and epidemiological information. The expected result is Negative.  Fact Sheet for Patients:   EntrepreneurPulse.com.au  Fact Sheet for Healthcare Providers:  IncredibleEmployment.be  This test is no t yet approved or cleared by the [place] FDA and  has been authorized for detection and/or diagnosis of SARS-CoV-2 by FDA under an Emergency Use Authorization (EUA). This EUA will remain  in effect (meaning this test can be used) for the duration of the COVID-19 declaration under Section 564(b)(1) of the Act, 21 U.S.C.section 360bbb-3(b)(1), unless the authorization is terminated  or revoked sooner.       Influenza A by PCR NEGATIVE NEGATIVE Final   Influenza B by PCR NEGATIVE NEGATIVE Final    Comment: (NOTE) The Xpert Xpress SARS-CoV-2/FLU/RSV plus assay is intended as an aid in the diagnosis of influenza from Nasopharyngeal swab specimens and should not be used as a sole basis for treatment. Nasal washings and aspirates are unacceptable for Xpert Xpress SARS-CoV-2/FLU/RSV testing.  Fact Sheet for Patients: EntrepreneurPulse.com.au  Fact Sheet for Healthcare Providers: IncredibleEmployment.be  This test is not yet approved or cleared by the [place] FDA and has been authorized for detection and/or diagnosis of SARS-CoV-2 by FDA under an Emergency Use Authorization (EUA). This EUA will remain in effect (meaning this test can be used) for the duration of the COVID-19 declaration under Section 564(b)(1) of the Act, 21 U.S.C. section 360bbb-3(b)(1), unless the authorization is terminated or revoked.  Performed at [facility], [address] [place], [place] [address]          Radiology Studies: DG Chest 2 View  Result Date: 11/21/2020 CLINICAL DATA:  Weakness, left rib pain after fall several weeks ago EXAM: CHEST - 2 VIEW COMPARISON:  02/14/2019 FINDINGS: Frontal and lateral views of the chest are obtained. Dual lead pacer is again noted and stable. The cardiac silhouette  is unremarkable. There is chronic elevation of the left hemidiaphragm. Trace left pleural fluid. No airspace disease or pneumothorax. There are displaced left lateral seventh and eighth rib fractures noted. No other acute bony abnormalities. IMPRESSION: 1. Minimally displaced left lateral seventh and eighth rib fractures. 2. Trace left pleural effusion. Electronically Signed   By: Randa Ngo M.D.   On: 11/21/2020 16:02   US Renal  Result Date: 11/21/2020 CLINICAL DATA:  Renal failure EXAM: RENAL / URINARY TRACT ULTRASOUND COMPLETE COMPARISON:  CT abdomen and pelvis 12/10/2016 FINDINGS: Right Kidney: Renal measurements: 8.6 x 4.7 x 4.6 cm = volume: 96 mL. Small circumscribed cysts are demonstrated in the upper pole measuring up to 1.3 cm maximal diameter. No significant parenchymal thinning or hydronephrosis. Left Kidney: Renal measurements: 9.3 x 5 x 5.1 cm = volume: 123 mL. 2.4 cm diameter cyst in the midpole was also present on prior CT. No significant parenchymal thinning or hydronephrosis.  Bladder: There is heterogeneous bladder wall thickening with trabeculation likely indicating chronic outflow obstruction. Echogenic structure in the base of the bladder corresponds to abnormality seen at prior CT. There are surgical clips in the area suggesting this could be postoperative. Alternatively, a chronic calcified stone or polypoid structure could have this appearance. Mild diffuse internal echoes within the bladder suggesting debris. This could represent concentrated urine, hemorrhage, or infection. Correlate with urinalysis. A chronic finding of the echogenic process suggest benign etiology but if stone is suspected, cystoscopy may be useful in further evaluation. Other: None. IMPRESSION: 1. No evidence of hydronephrosis in either kidney. 2. Small bilateral renal cysts. 3. Bladder wall thickening with trabeculation suggesting chronic outflow obstruction. Nonspecific large echogenic structure demonstrated in  the base of the bladder which was present on prior CT from 12/10/2016. Diffuse internal echoes in the urine. Correlate with urinalysis. Consider urology consultation. Electronically Signed   By: Lucienne Capers M.D.   On: 11/21/2020 17:55        Scheduled Meds:  amiodarone  100 mg Oral QPM   cyanocobalamin  1,000 mcg Intramuscular Q30 days   dorzolamide-timolol  1 drop Both Eyes BID   feeding supplement  237 mL Oral TID BM   levothyroxine  100 mcg Oral QAC breakfast   mirtazapine  15 mg Oral QHS   multivitamin with minerals  1 tablet Oral Daily   pantoprazole  40 mg Oral Daily   pravastatin  20 mg Oral q1800   simethicone  80 mg Oral QID   tamsulosin  0.4 mg Oral Daily   Continuous Infusions:   sodium bicarbonate (isotonic) infusion in sterile water 75 mL/hr at 11/23/20 0025     LOS: 2 days    Time spent: 25 minutes    Sidney Ace, MD [facility] Pager [phone]  If 7PM-7AM, please contact night-coverage 11/23/2020, 10:54 AM

## 2020-11-23 NOTE — Progress Notes (Signed)
[facility]  PROGRESS NOTE   Subjective:   Patient seen at bedside comfortable. Urology note appreciated, scheduled for cystoscopy Monday or Tuesday.  Objective:  Vital signs in last 24 hours:  Temp:  [97.6 F (36.4 C)-98.4 F (36.9 C)] 98.2 F (36.8 C) (08/27 0813) Pulse Rate:  [61-83] 79 (08/27 0813) Resp:  [15-17] 17 (08/27 0813) BP: (102-138)/(59-75) 115/66 (08/27 0813) SpO2:  [95 %-97 %] 96 % (08/27 0813) Weight:  [61 kg] 61 kg (08/27 0500)  Weight change: 4.414 kg Filed Weights   11/21/20 1420 11/22/20 0540 11/23/20 0500  Weight: 56.6 kg 59.7 kg 61 kg    Intake/Output: I/O last 3 completed shifts: In: 2361 [P.O.:600; I.V.:1761] Out: 2000 [Urine:2000]   Intake/Output this shift:  Total I/O In: 1472.8 [P.O.:240; I.V.:1232.8] Out: 500 [Urine:500]  Physical Exam: General:  No acute distress  Head:  Normocephalic, atraumatic. Moist oral mucosal membranes  Eyes:  Anicteric  Neck:  Supple  Lungs:   Clear to auscultation, normal effort  Heart:  S1S2 no rubs  Abdomen:   Soft, nontender, bowel sounds present  Extremities:  peripheral edema.  Neurologic:  Awake, alert, following commands  Skin:  No lesions  Access:     Basic Metabolic Panel: Recent Labs  Lab 11/20/20 1536 11/21/20 1427 11/22/20 0444 11/23/20 0744  NA 137 137 137 139  K 4.4 5.3* 4.1 3.4*  CL 109 112* 108 102  CO2 15* 13* 20* 26  GLUCOSE 123* 131* 138* 102*  BUN 93 Repeated and verified X2.* 96* 89* 83*  CREATININE 4.76 Repeated and verified X2.* 5.05* 4.77* 4.60*  CALCIUM 8.8 8.9 8.1* 8.3*  PHOS  --   --  6.8*  --     CBC: Recent Labs  Lab 11/20/20 1536 11/21/20 1427 11/22/20 0444 11/23/20 0744  WBC 6.3 6.6 6.3 6.9  NEUTROABS 4.6 4.7  --  4.3  HGB 10.8* 10.9* 10.2* 11.0*  HCT 33.7* 34.8* 31.9* 33.1*  MCV 100.1* 102.4* 99.7 97.6  PLT 238.0 237 224 211     Urinalysis: Recent Labs    11/21/20 1600  COLORURINE YELLOW*  LABSPEC 1.009  PHURINE 6.0  GLUCOSEU  NEGATIVE  HGBUR MODERATE*  BILIRUBINUR NEGATIVE  KETONESUR NEGATIVE  PROTEINUR 30*  NITRITE NEGATIVE  LEUKOCYTESUR LARGE*      Imaging: DG Chest 2 View  Result Date: 11/21/2020 CLINICAL DATA:  Weakness, left rib pain after fall several weeks ago EXAM: CHEST - 2 VIEW COMPARISON:  02/14/2019 FINDINGS: Frontal and lateral views of the chest are obtained. Dual lead pacer is again noted and stable. The cardiac silhouette is unremarkable. There is chronic elevation of the left hemidiaphragm. Trace left pleural fluid. No airspace disease or pneumothorax. There are displaced left lateral seventh and eighth rib fractures noted. No other acute bony abnormalities. IMPRESSION: 1. Minimally displaced left lateral seventh and eighth rib fractures. 2. Trace left pleural effusion. Electronically Signed   By: Randa Ngo M.D.   On: 11/21/2020 16:02   US Renal  Result Date: 11/21/2020 CLINICAL DATA:  Renal failure EXAM: RENAL / URINARY TRACT ULTRASOUND COMPLETE COMPARISON:  CT abdomen and pelvis 12/10/2016 FINDINGS: Right Kidney: Renal measurements: 8.6 x 4.7 x 4.6 cm = volume: 96 mL. Small circumscribed cysts are demonstrated in the upper pole measuring up to 1.3 cm maximal diameter. No significant parenchymal thinning or hydronephrosis. Left Kidney: Renal measurements: 9.3 x 5 x 5.1 cm = volume: 123 mL. 2.4 cm diameter cyst in the midpole was also present on  prior CT. No significant parenchymal thinning or hydronephrosis. Bladder: There is heterogeneous bladder wall thickening with trabeculation likely indicating chronic outflow obstruction. Echogenic structure in the base of the bladder corresponds to abnormality seen at prior CT. There are surgical clips in the area suggesting this could be postoperative. Alternatively, a chronic calcified stone or polypoid structure could have this appearance. Mild diffuse internal echoes within the bladder suggesting debris. This could represent concentrated urine,  hemorrhage, or infection. Correlate with urinalysis. A chronic finding of the echogenic process suggest benign etiology but if stone is suspected, cystoscopy may be useful in further evaluation. Other: None. IMPRESSION: 1. No evidence of hydronephrosis in either kidney. 2. Small bilateral renal cysts. 3. Bladder wall thickening with trabeculation suggesting chronic outflow obstruction. Nonspecific large echogenic structure demonstrated in the base of the bladder which was present on prior CT from 12/10/2016. Diffuse internal echoes in the urine. Correlate with urinalysis. Consider urology consultation. Electronically Signed   By: Lucienne Capers M.D.   On: 11/21/2020 17:55     Medications:     sodium bicarbonate (isotonic) infusion in sterile water 75 mL/hr at 11/23/20 1130    amiodarone  100 mg Oral QPM   cyanocobalamin  1,000 mcg Intramuscular Q30 days   dorzolamide-timolol  1 drop Both Eyes BID   feeding supplement  237 mL Oral TID BM   levothyroxine  100 mcg Oral QAC breakfast   mirtazapine  15 mg Oral QHS   multivitamin with minerals  1 tablet Oral Daily   pantoprazole  40 mg Oral Daily   pravastatin  20 mg Oral q1800   simethicone  80 mg Oral QID   tamsulosin  0.4 mg Oral Daily    Assessment/ Plan:     Principal Problem:   AKI (acute kidney injury) ([place]) Active Problems:   Hypothyroidism   Hyperlipidemia   Essential hypertension   GERD   Prostate cancer, in remission   PACEMAKER, PERMANENT   Long term (current) use of anticoagulants   Longstanding persistent atrial fibrillation ([name])   Chronic kidney disease (CKD), stage IV (severe) ([name])   Protein-calorie malnutrition, severe  #1 acute kidney injury on the top of chronic kidney disease.  Most likely due to obstructive disease.  GU note appreciated.  Scheduled for OR Monday/Tuesday.  GFR slightly improved.  No acute need for renal replacement therapy.  #2: Congestive heart failure: Diuretics were placed on hold at this  time.  We will continue to monitor closely.  #3: Hyperkalemia: Has improved significantly with bicarbonate drip.  We will change IV fluids to normal saline at 75 cc an hour.    LOS: [place], MD [facility] 8/27/20223:22 PM

## 2020-11-23 NOTE — TOC Initial Note (Addendum)
Transition of Care [facility] - Initial/Assessment Note    Patient Details  Name: [name] [name] MRN: [id number] Date of Birth: 09/08/1922  Transition of Care [facility]) CM/SW Contact:    Magnus Ivan, LCSW Phone Number: 11/23/2020, 1:59 PM  Clinical Narrative:            CSW met with patient and son at bedside then spoke to daughter/HCPOA [facility] via phone per their request. Patient lives with [place] who provides transportation. PCP is Dr. Edilia Bo. Patient has a RW, cane, and raised toilet seat at home. No HH history. Patient went to [facility] about 2 years ago. Patient has had 2 of the COVID vaccines plus 1 booster vaccine. Patient and children are agreeable to SNF recommendation and prefer [facility]. CSW is starting SNF work up. Sent message to [place] with [facility] to ask her to review patient, she agreed.       Unable to pull up patient in NCMust, TOC will need to call Helpdesk Monday for PASRR.    Expected Discharge Plan: Skilled Nursing Facility Barriers to Discharge: Continued Medical Work up   Patient Goals and CMS Choice Patient states their goals for this hospitalization and ongoing recovery are:: SNF rehab CMS Medicare.gov Compare Post Acute Care list provided to:: Patient Choice offered to / list presented to : Patient, Adult Children, [place] / Guardian  Expected Discharge Plan and Services Expected Discharge Plan: [place] arrangements for the past 2 months: Single Family Home                                      Prior Living Arrangements/Services Living arrangements for the past 2 months: Single Family Home Lives with:: Adult Children Patient language and need for interpreter reviewed:: Yes Do you feel safe going back to the place where you live?: Yes      Need for Family Participation in Patient Care: Yes (Comment) Care giver support system in place?: Yes (comment) Current home services: DME Criminal  Activity/Legal Involvement Pertinent to Current Situation/Hospitalization: No - Comment as needed  Activities of Daily Living Home Assistive Devices/Equipment: Gilford Rile (specify type) ADL Screening (condition at time of admission) Patient's cognitive ability adequate to safely complete daily activities?: Yes Is the patient deaf or have difficulty hearing?: No Does the patient have difficulty seeing, even when wearing glasses/contacts?: No Does the patient have difficulty concentrating, remembering, or making decisions?: No Patient able to express need for assistance with ADLs?: Yes Does the patient have difficulty dressing or bathing?: Yes Independently performs ADLs?: No Does the patient have difficulty walking or climbing stairs?: Yes Weakness of Legs: Both Weakness of Arms/Hands: Both  Permission Sought/Granted Permission sought to share information with : Facility Sport and exercise psychologist, Family Supports Permission granted to share information with : Yes, Verbal Permission Granted  Share Information with NAME: daughter/HCPOA [name]  Permission granted to share info w AGENCY: SNFs        Emotional Assessment       Orientation: : Oriented to Place, Oriented to  Time, Oriented to Situation, Oriented to Self Alcohol / Substance Use: Not Applicable Psych Involvement: No (comment)  Admission diagnosis:  Generalized weakness [R53.1] AKI (acute kidney injury) ([place]) [N17.9] Acute renal failure, unspecified acute renal failure type ([place]) [N17.9] Closed fracture of multiple ribs of left side with routine healing, subsequent encounter [S22.42XD] Patient Active Problem  List   Diagnosis Date Noted   Protein-calorie malnutrition, severe 11/23/2020   AKI (acute kidney injury) ([place]) 11/21/2020   Aortic atherosclerosis ([employer]) 10/22/2020   Age-related osteoporosis with current pathological fracture with routine healing 02/21/2019   Chronic kidney disease (CKD), stage IV (severe) ([place])  02/21/2019   Longstanding persistent atrial fibrillation ([place])    Glaucoma    Long term (current) use of anticoagulants 06/24/2010   GLAUCOMA 08/07/2008   GERD 08/07/2008   PACEMAKER, PERMANENT 08/07/2008   Vitamin B 12 deficiency 07/19/2008   RADIATION PROCTITIS 07/18/2008   DIVERTICULOSIS, COLON 07/18/2008   Prostate cancer, in remission 07/18/2008   Essential hypertension 05/09/2008   Hyperlipidemia 02/06/2008   Atrial fibrillation ([place]) 02/06/2008   Hypothyroidism 12/01/2007   PCP:  Owens Loffler, MD Pharmacy:   [id number] [address]ina [address], [place] - [address] AT [address] [place] [address] Phone: [phone] Fax: [phone]     Social Determinants of Health (SDOH) Interventions    Readmission Risk Interventions Readmission Risk Prevention Plan 11/23/2020  Transportation Screening Complete  PCP or Specialist Appt within 3-5 Days Complete  HRI or [place] Complete  Social Work Consult for [place] Planning/Counseling Complete  Palliative Care Screening Not Applicable  Medication Review Press photographer) Complete  Some recent data might be hidden

## 2020-11-23 NOTE — NC FL2 (Signed)
[place] LEVEL OF CARE SCREENING TOOL     IDENTIFICATION  Patient Name: [name] [name] Birthdate: 1923-02-13 Sex: male Admission Date (Current Location): 11/21/2020  [facility] [place] Number:  Engineering geologist and Address:  [facility], [address] [place], [place] [address]      Provider Number: [id number]  Attending Physician Name and Address:  Sidney Ace, MD  Relative Name and Phone Number:  [name] [phone]    Current Level of Care: Hospital Recommended Level of Care: [place] Prior Approval Number:    Date Approved/Denied:   PASRR Number:    Discharge Plan:      Current Diagnoses: Patient Active Problem List   Diagnosis Date Noted   Protein-calorie malnutrition, severe 11/23/2020   AKI (acute kidney injury) ([place]) 11/21/2020   Aortic atherosclerosis ([place]) 10/22/2020   Age-related osteoporosis with current pathological fracture with routine healing 02/21/2019   Chronic kidney disease (CKD), stage IV (severe) ([place]) 02/21/2019   Longstanding persistent atrial fibrillation ([place])    Glaucoma    Long term (current) use of anticoagulants 06/24/2010   GLAUCOMA 08/07/2008   GERD 08/07/2008   PACEMAKER, PERMANENT 08/07/2008   Vitamin B 12 deficiency 07/19/2008   RADIATION PROCTITIS 07/18/2008   DIVERTICULOSIS, COLON 07/18/2008   Prostate cancer, in remission 07/18/2008   Essential hypertension 05/09/2008   Hyperlipidemia 02/06/2008   Atrial fibrillation ([place]) 02/06/2008   Hypothyroidism 12/01/2007    Orientation RESPIRATION BLADDER Height & Weight     Self, Time, Situation, Place  Normal Incontinent, External catheter Weight: 134 lb 7.7 oz (61 kg) Height:  5\' 8"  (172.7 cm)  BEHAVIORAL SYMPTOMS/MOOD NEUROLOGICAL BOWEL NUTRITION STATUS      Incontinent Diet (regular diet, thin liquids)  AMBULATORY STATUS COMMUNICATION OF NEEDS Skin   Extensive Assist Verbally Bruising                        Personal Care Assistance Level of Assistance  Bathing, Feeding, Dressing Bathing Assistance: Maximum assistance Feeding assistance: Limited assistance Dressing Assistance: Maximum assistance     Functional Limitations Info             SPECIAL CARE FACTORS FREQUENCY  PT (By licensed PT), OT (By licensed OT)     PT Frequency: 5 times per week OT Frequency: 5 times per week            Contractures      Additional Factors Info  Code Status, Allergies Code Status Info: DNR Allergies Info: Penicillins           Current Medications (11/23/2020):  This is the current hospital active medication list Current Facility-Administered Medications  Medication Dose Route Frequency Provider Last Rate Last Admin   acetaminophen (TYLENOL) tablet 650 mg  650 mg Oral Q6H PRN Elwyn Reach, MD   650 mg at 11/22/20 2155   Or   acetaminophen (TYLENOL) suppository 650 mg  650 mg Rectal Q6H PRN Elwyn Reach, MD       amiodarone (PACERONE) tablet 100 mg  100 mg Oral QPM Gala Romney L, MD   100 mg at 11/22/20 1710   calcium carbonate (TUMS - dosed in mg elemental calcium) chewable tablet 500 mg of elemental calcium  500 mg of elemental calcium Oral Q6H PRN Elwyn Reach, MD       camphor-menthol (SARNA) lotion 1 application  1 application Topical N3Z PRN Elwyn Reach, MD  And   hydrOXYzine (ATARAX/VISTARIL) tablet 25 mg  25 mg Oral Q8H PRN Elwyn Reach, MD       cyanocobalamin ((VITAMIN B-12)) injection 1,000 mcg  1,000 mcg Intramuscular Q30 days Gala Romney L, MD   1,000 mcg at 11/22/20 0930   docusate sodium (COLACE) capsule 50 mg  50 mg Oral BID PRN Elwyn Reach, MD       docusate sodium (ENEMEEZ) enema 283 mg  1 enema Rectal PRN Elwyn Reach, MD       dorzolamide-timolol (COSOPT) 22.3-6.8 MG/ML ophthalmic solution 1 drop  1 drop Both Eyes BID Gala Romney L, MD   1 drop at 11/23/20 0801   feeding supplement (ENSURE ENLIVE / ENSURE  PLUS) liquid 237 mL  237 mL Oral TID BM Sreenath, Sudheer B, MD   237 mL at 11/23/20 1346   hydrALAZINE (APRESOLINE) injection 10 mg  10 mg Intravenous Q4H PRN Ralene Muskrat B, MD       levothyroxine (SYNTHROID) tablet 100 mcg  100 mcg Oral QAC breakfast Gala Romney L, MD   100 mcg at 11/23/20 0510   loperamide (IMODIUM) capsule 2 mg  2 mg Oral PRN Elwyn Reach, MD       mirtazapine (REMERON) tablet 15 mg  15 mg Oral QHS Gala Romney L, MD   15 mg at 11/22/20 2156   multivitamin with minerals tablet 1 tablet  1 tablet Oral Daily Ralene Muskrat B, MD   1 tablet at 11/23/20 0800   ondansetron (ZOFRAN) tablet 4 mg  4 mg Oral Q6H PRN Elwyn Reach, MD       Or   ondansetron (ZOFRAN) injection 4 mg  4 mg Intravenous Q6H PRN Elwyn Reach, MD       pantoprazole (PROTONIX) EC tablet 40 mg  40 mg Oral Daily Jonelle Sidle, Mohammad L, MD   40 mg at 11/23/20 0800   polyethylene glycol (MIRALAX / GLYCOLAX) packet 17 g  17 g Oral Daily PRN Elwyn Reach, MD       pravastatin (PRAVACHOL) tablet 20 mg  20 mg Oral q1800 Gala Romney L, MD   20 mg at 11/22/20 1710   simethicone (MYLICON) chewable tablet 80 mg  80 mg Oral QID Gala Romney L, MD   80 mg at 11/23/20 1344   sodium bicarbonate 150 mEq in sterile water 1,150 mL infusion   Intravenous Continuous Colon Flattery, NP 75 mL/hr at 11/23/20 1130 Infusion Verify at 11/23/20 1130   sorbitol 70 % solution 30 mL  30 mL Oral PRN Elwyn Reach, MD       tamsulosin (FLOMAX) capsule 0.4 mg  0.4 mg Oral Daily Jonelle Sidle, Mohammad L, MD   0.4 mg at 11/23/20 0800   traMADol (ULTRAM) tablet 50 mg  50 mg Oral Q6H PRN Elwyn Reach, MD       zolpidem (AMBIEN) tablet 5 mg  5 mg Oral QHS PRN Elwyn Reach, MD         Discharge Medications: Please see discharge summary for a list of discharge medications.  Relevant Imaging Results:  Relevant Lab Results:   Additional Information SS #: [place],  LCSW

## 2020-11-24 LAB — BASIC METABOLIC PANEL
Anion gap: 9 (ref 5–15)
BUN: 85 mg/dL — ABNORMAL HIGH (ref 8–23)
CO2: 25 mmol/L (ref 22–32)
Calcium: 8.4 mg/dL — ABNORMAL LOW (ref 8.9–10.3)
Chloride: 106 mmol/L (ref 98–111)
Creatinine, Ser: 4.55 mg/dL — ABNORMAL HIGH (ref 0.61–1.24)
GFR, Estimated: 11 mL/min — ABNORMAL LOW (ref >60)
Glucose, Bld: 88 mg/dL (ref 70–99)
Potassium: 3.6 mmol/L (ref 3.5–5.1)
Sodium: 140 mmol/L (ref 135–145)

## 2020-11-24 LAB — CBC WITH DIFFERENTIAL/PLATELET
Abs Immature Granulocytes: 0.01 10*3/uL (ref 0.00–0.07)
Basophils Absolute: 0 10*3/uL (ref 0.0–0.1)
Basophils Relative: 1 %
Eosinophils Absolute: 0.3 10*3/uL (ref 0.0–0.5)
Eosinophils Relative: 5 %
HCT: 31.9 % — ABNORMAL LOW (ref 39.0–52.0)
Hemoglobin: 10.5 g/dL — ABNORMAL LOW (ref 13.0–17.0)
Immature Granulocytes: 0 %
Lymphocytes Relative: 23 %
Lymphs Abs: 1.5 10*3/uL (ref 0.7–4.0)
MCH: 32.2 pg (ref 26.0–34.0)
MCHC: 32.9 g/dL (ref 30.0–36.0)
MCV: 97.9 fL (ref 80.0–100.0)
Monocytes Absolute: 0.9 10*3/uL (ref 0.1–1.0)
Monocytes Relative: 14 %
Neutro Abs: 3.6 10*3/uL (ref 1.7–7.7)
Neutrophils Relative %: 57 %
Platelets: 205 10*3/uL (ref 150–400)
RBC: 3.26 MIL/uL — ABNORMAL LOW (ref 4.22–5.81)
RDW: 15.5 % (ref 11.5–15.5)
WBC: 6.2 10*3/uL (ref 4.0–10.5)
nRBC: 0 % (ref 0.0–0.2)

## 2020-11-24 LAB — PROTIME-INR
INR: 2 — ABNORMAL HIGH (ref 0.8–1.2)
Prothrombin Time: 22.6 seconds — ABNORMAL HIGH (ref 11.4–15.2)

## 2020-11-24 NOTE — Plan of Care (Signed)
  Problem: Clinical Measurements: Goal: Ability to maintain clinical measurements within normal limits will improve Outcome: Progressing Goal: Will remain free from infection Outcome: Progressing Goal: Diagnostic test results will improve Outcome: Progressing Goal: Respiratory complications will improve Outcome: Progressing Goal: Cardiovascular complication will be avoided Outcome: Progressing   Pt is alert & oriented x4. V/S stable. No complaints of pain or SOB.

## 2020-11-24 NOTE — Progress Notes (Signed)
PROGRESS NOTE    [name] [name]  UXN:[id number] DOB: April 11, 1922 DOA: 11/21/2020 PCP: Owens Loffler, MD    Brief Narrative:  87 y.o. male with medical history significant of essential hypertension, chronic kidney disease stage IV, symptomatic bradycardia with pacemaker in place, hypertension lipidemia, prostate cancer, paroxysmal atrial fibrillation, diverticular disease GERD and multiple other medical problems who is currently on Coumadin.  Patient presented the ER secondary to having abnormal labs.  His daughter brought him in and mentioned that he has been gradually declining over the last couple of weeks.  Patient has had multiple falls in the last 1 month.  He had small rib fracture with pneumothorax.  Was on the service cardiovascular surgery at the time.  Patient continue to follow with PCP.  He was found to have worsening renal function although he had chronic kidney disease stage III at the time.  His intake has been declining.  He is also not been himself.  He did mention that he had dysuria with some abdominal flank.  No hematuria.  No diarrhea no vomiting.  Patient now has worsening renal failure with creatinine more than 5.  Admitted to the medicine service with nephrology and urology aware.  AKI likely multifactorial.  Per urology obstructive uropathy and associated AKI is unlikely due to lack of hydronephrosis on imaging.  Patient's creatinine is improving while on IV fluids.  Urology planning tentatively for cystoscopy, dilatation and Foley placement in operating room early next week   Assessment & Plan:   Principal Problem:   AKI (acute kidney injury) ([place]) Active Problems:   Hypothyroidism   Hyperlipidemia   Essential hypertension   GERD   Prostate cancer, in remission   PACEMAKER, PERMANENT   Long term (current) use of anticoagulants   Longstanding persistent atrial fibrillation ([name])   Chronic kidney disease (CKD), stage IV (severe) ([name])   Protein-calorie  malnutrition, severe  AKI on CKD stage IIIb Metabolic acidosis, improved Multifactorial, prerenal versus obstructive uropathy Poor p.o. intake, recent hospitalizations Plan: Continue normal saline 75 cc/h Daily BMP Continue Flomax Appreciate nephrology input Appreciate urology input Tentative plan for cystoscopy, dilatation, Foley placement in OR early next week Coumadin on hold We will tentatively make n.p.o. after midnight in anticipation of procedure tomorrow Will check a.m. INR  Paroxysmal atrial fibrillation Currently controlled on amiodarone Was therapeutic on warfarin Coumadin on hold in anticipation of surgical procedure May need vitamin K/FFP if INR remains elevated 8/28: INR 2.0  Possible UTI Leukocytes on UA No other clinical indications of infection Suspect asymptomatic bacteriuria No indication for antibiotics at this time  Essential hypertension Lasix on hold As needed hydralazine  GERD PPI  Hyperlipidemia Pravastatin  Hypothyroidism Synthroid   DVT prophylaxis: SCDs Code Status: Full Family Communication: Daughter at bedside 8/26, 827 Disposition Plan: Status is: Inpatient  Remains inpatient appropriate because:Inpatient level of care appropriate due to severity of illness  Dispo: The patient is from: Home              Anticipated d/c is to: Home              Patient currently is not medically stable to d/c.   Difficult to place patient No       Level of care: Med-Surg  Consultants:  Nephrology Urology  Procedures: None  Antimicrobials:  None   Subjective: Seen and examined.  Sleeping but easily awakened.  No visible distress  Objective: Vitals:   11/23/20 2015 11/24/20 0500 11/24/20 2542 11/24/20 7062  BP: (!) 98/58  107/66 115/63  Pulse: 90  70 60  Resp: 18  18 18   Temp: 98.6 F (37 C)  98.5 F (36.9 C) 98.5 F (36.9 C)  TempSrc: Oral   Oral  SpO2: 96%  94% 92%  Weight:  63.1 kg    Height:         Intake/Output Summary (Last 24 hours) at 11/24/2020 1033 Last data filed at 11/24/2020 0523 Gross per 24 hour  Intake 1802.32 ml  Output 1500 ml  Net 302.32 ml   Filed Weights   11/22/20 0540 11/23/20 0500 11/24/20 0500  Weight: 59.7 kg 61 kg 63.1 kg    Examination:  General exam: No acute distress Respiratory system: Lungs clear.  Normal work of breathing.  Room air Cardiovascular system: S1-S2, regular rate and rhythm, 2/6 murmur, no pedal edema  gastrointestinal system: Soft, nontender, nondistended, normal bowel sounds Central nervous system: Alert and oriented. No focal neurological deficits. Extremities: Symmetric 5 x 5 power. Skin: No rashes, lesions or ulcers Psychiatry: Judgement and insight appear normal. Mood & affect appropriate.     Data Reviewed: I have personally reviewed following labs and imaging studies  CBC: Recent Labs  Lab 11/20/20 1536 11/21/20 1427 11/22/20 0444 11/23/20 0744 11/24/20 0808  WBC 6.3 6.6 6.3 6.9 6.2  NEUTROABS 4.6 4.7  --  4.3 3.6  HGB 10.8* 10.9* 10.2* 11.0* 10.5*  HCT 33.7* 34.8* 31.9* 33.1* 31.9*  MCV 100.1* 102.4* 99.7 97.6 97.9  PLT 238.0 237 224 211 007   Basic Metabolic Panel: Recent Labs  Lab 11/20/20 1536 11/21/20 1427 11/22/20 0444 11/23/20 0744 11/24/20 0808  NA 137 137 137 139 140  K 4.4 5.3* 4.1 3.4* 3.6  CL 109 112* 108 102 106  CO2 15* 13* 20* 26 25  GLUCOSE 123* 131* 138* 102* 88  BUN 93 Repeated and verified X2.* 96* 89* 83* 85*  CREATININE 4.76 Repeated and verified X2.* 5.05* 4.77* 4.60* 4.55*  CALCIUM 8.8 8.9 8.1* 8.3* 8.4*  PHOS  --   --  6.8*  --   --    GFR: Estimated Creatinine Clearance: 8.7 mL/min (A) (by C-G formula based on SCr of 4.55 mg/dL (H)). Liver Function Tests: Recent Labs  Lab 11/20/20 1536 11/21/20 1427 11/22/20 0444  AST 17 18 17   ALT 14 16 15   ALKPHOS 108 99 93  BILITOT 0.5 0.8 0.8  PROT 7.0 7.0 6.3*  ALBUMIN 3.5 3.3* 2.9*   No results for input(s): LIPASE,  AMYLASE in the last 168 hours. No results for input(s): AMMONIA in the last 168 hours. Coagulation Profile: Recent Labs  Lab 11/21/20 1559 11/22/20 0444 11/23/20 0950 11/24/20 0432  INR 2.8* 3.0* 3.0* 2.0*   Cardiac Enzymes: No results for input(s): CKTOTAL, CKMB, CKMBINDEX, TROPONINI in the last 168 hours. BNP (last 3 results) No results for input(s): PROBNP in the last 8760 hours. HbA1C: No results for input(s): HGBA1C in the last 72 hours. CBG: No results for input(s): GLUCAP in the last 168 hours. Lipid Profile: No results for input(s): CHOL, HDL, LDLCALC, TRIG, CHOLHDL, LDLDIRECT in the last 72 hours. Thyroid Function Tests: No results for input(s): TSH, T4TOTAL, FREET4, T3FREE, THYROIDAB in the last 72 hours. Anemia Panel: No results for input(s): VITAMINB12, FOLATE, FERRITIN, TIBC, IRON, RETICCTPCT in the last 72 hours. Sepsis Labs: No results for input(s): PROCALCITON, LATICACIDVEN in the last 168 hours.  Recent Results (from the past 240 hour(s))  Resp Panel by RT-PCR (Flu A&B, Covid) Nasopharyngeal  Swab     Status: None   Collection Time: 11/21/20  3:59 PM   Specimen: Nasopharyngeal Swab; Nasopharyngeal(NP) swabs in vial transport medium  Result Value Ref Range Status   SARS Coronavirus 2 by RT PCR NEGATIVE NEGATIVE Final    Comment: (NOTE) SARS-CoV-2 target nucleic acids are NOT DETECTED.  The SARS-CoV-2 RNA is generally detectable in upper respiratory specimens during the acute phase of infection. The lowest concentration of SARS-CoV-2 viral copies this assay can detect is 138 copies/mL. A negative result does not preclude SARS-Cov-2 infection and should not be used as the sole basis for treatment or other patient management decisions. A negative result may occur with  improper specimen collection/handling, submission of specimen other than nasopharyngeal swab, presence of viral mutation(s) within the areas targeted by this assay, and inadequate number of  viral copies(<138 copies/mL). A negative result must be combined with clinical observations, patient history, and epidemiological information. The expected result is Negative.  Fact Sheet for Patients:  EntrepreneurPulse.com.au  Fact Sheet for Healthcare Providers:  IncredibleEmployment.be  This test is no t yet approved or cleared by the [place] FDA and  has been authorized for detection and/or diagnosis of SARS-CoV-2 by FDA under an Emergency Use Authorization (EUA). This EUA will remain  in effect (meaning this test can be used) for the duration of the COVID-19 declaration under Section 564(b)(1) of the Act, 21 U.S.C.section 360bbb-3(b)(1), unless the authorization is terminated  or revoked sooner.       Influenza A by PCR NEGATIVE NEGATIVE Final   Influenza B by PCR NEGATIVE NEGATIVE Final    Comment: (NOTE) The Xpert Xpress SARS-CoV-2/FLU/RSV plus assay is intended as an aid in the diagnosis of influenza from Nasopharyngeal swab specimens and should not be used as a sole basis for treatment. Nasal washings and aspirates are unacceptable for Xpert Xpress SARS-CoV-2/FLU/RSV testing.  Fact Sheet for Patients: EntrepreneurPulse.com.au  Fact Sheet for Healthcare Providers: IncredibleEmployment.be  This test is not yet approved or cleared by the [place] FDA and has been authorized for detection and/or diagnosis of SARS-CoV-2 by FDA under an Emergency Use Authorization (EUA). This EUA will remain in effect (meaning this test can be used) for the duration of the COVID-19 declaration under Section 564(b)(1) of the Act, 21 U.S.C. section 360bbb-3(b)(1), unless the authorization is terminated or revoked.  Performed at [facility], [place], [place] [address]   C Difficile Quick Screen w PCR reflex     Status: None   Collection Time: 11/23/20  4:45 PM   Specimen: STOOL   Result Value Ref Range Status   C Diff antigen NEGATIVE NEGATIVE Final   C Diff toxin NEGATIVE NEGATIVE Final   C Diff interpretation No C. difficile detected.  Final    Comment: Performed at [facility], [address] [place], [place] [address]  Gastrointestinal Panel by PCR , Stool     Status: None   Collection Time: 11/23/20  4:45 PM   Specimen: Stool  Result Value Ref Range Status   Campylobacter species NOT DETECTED NOT DETECTED Final   Plesimonas shigelloides NOT DETECTED NOT DETECTED Final   Salmonella species NOT DETECTED NOT DETECTED Final   Yersinia enterocolitica NOT DETECTED NOT DETECTED Final   Vibrio species NOT DETECTED NOT DETECTED Final   Vibrio cholerae NOT DETECTED NOT DETECTED Final   Enteroaggregative E coli (EAEC) NOT DETECTED NOT DETECTED Final   Enteropathogenic E coli (EPEC) NOT DETECTED NOT DETECTED Final  Enterotoxigenic E coli (ETEC) NOT DETECTED NOT DETECTED Final   Shiga like toxin producing E coli (STEC) NOT DETECTED NOT DETECTED Final   Shigella/Enteroinvasive E coli (EIEC) NOT DETECTED NOT DETECTED Final   Cryptosporidium NOT DETECTED NOT DETECTED Final   Cyclospora cayetanensis NOT DETECTED NOT DETECTED Final   Entamoeba histolytica NOT DETECTED NOT DETECTED Final   Giardia lamblia NOT DETECTED NOT DETECTED Final   Adenovirus F40/41 NOT DETECTED NOT DETECTED Final   Astrovirus NOT DETECTED NOT DETECTED Final   Norovirus GI/GII NOT DETECTED NOT DETECTED Final   Rotavirus A NOT DETECTED NOT DETECTED Final   Sapovirus (I, II, IV, and V) NOT DETECTED NOT DETECTED Final    Comment: Performed at [facility], [address], [place], [place] [address]         Radiology Studies: No results found.      Scheduled Meds:  amiodarone  100 mg Oral QPM   cyanocobalamin  1,000 mcg Intramuscular Q30 days   dorzolamide-timolol  1 drop Both Eyes BID   feeding supplement  237 mL Oral TID BM   levothyroxine  100 mcg Oral QAC  breakfast   mirtazapine  15 mg Oral QHS   multivitamin with minerals  1 tablet Oral Daily   pantoprazole  40 mg Oral Daily   pravastatin  20 mg Oral q1800   simethicone  80 mg Oral QID   tamsulosin  0.4 mg Oral Daily   Continuous Infusions:  sodium chloride 75 mL/hr at 11/24/20 0527     LOS: 3 days    Time spent: 25 minutes    Sidney Ace, MD [facility] Pager [phone]  If 7PM-7AM, please contact night-coverage 11/24/2020, 10:33 AM

## 2020-11-24 NOTE — TOC Progression Note (Addendum)
Transition of Care [facility]) - Progression Note    Patient Details  Name: [name] [name] MRN: [id number] Date of Birth: 18-Nov-1922  Transition of Care [facility]) CM/SW [place], LCSW Phone Number: 11/24/2020, 12:06 PM  Clinical Narrative:   Reached out to [facility] with [facility] to follow up. Waiting on response on if they can make a bed offer. Patient will have a Urology procedure early in the week, and will not be ready for SNF until after that.   12:48- Magda Paganini with [facility] is able to accept patient pending financial review which will be done on Monday. TOC will need to follow up Monday.   Expected Discharge Plan: Skilled Nursing Facility Barriers to Discharge: Continued Medical Work up  Expected Discharge Plan and Services Expected Discharge Plan: [place] arrangements for the past 2 months: Single Family Home                                       Social Determinants of Health (SDOH) Interventions    Readmission Risk Interventions Readmission Risk Prevention Plan 11/23/2020  Transportation Screening Complete  PCP or Specialist Appt within 3-5 Days Complete  HRI or Home Care Consult Complete  Social Work Consult for [place] Planning/Counseling Complete  Palliative Care Screening Not Applicable  Medication Review Press photographer) Complete  Some recent data might be hidden

## 2020-11-24 NOTE — Progress Notes (Signed)
[facility]  PROGRESS NOTE   Subjective:   Patient seen at bedside.  Family at bedside. Patient is scheduled for cystoscopy possibly tomorrow or day after.  Objective:  Vital signs in last 24 hours:  Temp:  [97.9 F (36.6 C)-98.6 F (37 C)] 98.5 F (36.9 C) (08/28 0829) Pulse Rate:  [60-94] 60 (08/28 0829) Resp:  [18-19] 18 (08/28 0829) BP: (97-115)/(58-66) 115/63 (08/28 0829) SpO2:  [92 %-96 %] 92 % (08/28 0829) Weight:  [63.1 kg] 63.1 kg (08/28 0500)  Weight change: 2.1 kg Filed Weights   11/22/20 0540 11/23/20 0500 11/24/20 0500  Weight: 59.7 kg 61 kg 63.1 kg    Intake/Output: I/O last 3 completed shifts: In: 2042.3 [P.O.:440; I.V.:1602.3] Out: 2200 [Urine:2200]   Intake/Output this shift:  No intake/output data recorded.  Physical Exam: General:  No acute distress  Head:  Normocephalic, atraumatic. Moist oral mucosal membranes  Eyes:  Anicteric  Neck:  Supple  Lungs:   Clear to auscultation, normal effort  Heart:  S1S2 no rubs  Abdomen:   Soft, nontender, bowel sounds present  Extremities:  peripheral edema.  Neurologic:  Awake, alert, following commands  Skin:  No lesions  Access:     Basic Metabolic Panel: Recent Labs  Lab 11/20/20 1536 11/21/20 1427 11/22/20 0444 11/23/20 0744 11/24/20 0808  NA 137 137 137 139 140  K 4.4 5.3* 4.1 3.4* 3.6  CL 109 112* 108 102 106  CO2 15* 13* 20* 26 25  GLUCOSE 123* 131* 138* 102* 88  BUN 93 Repeated and verified X2.* 96* 89* 83* 85*  CREATININE 4.76 Repeated and verified X2.* 5.05* 4.77* 4.60* 4.55*  CALCIUM 8.8 8.9 8.1* 8.3* 8.4*  PHOS  --   --  6.8*  --   --     CBC: Recent Labs  Lab 11/20/20 1536 11/21/20 1427 11/22/20 0444 11/23/20 0744 11/24/20 0808  WBC 6.3 6.6 6.3 6.9 6.2  NEUTROABS 4.6 4.7  --  4.3 3.6  HGB 10.8* 10.9* 10.2* 11.0* 10.5*  HCT 33.7* 34.8* 31.9* 33.1* 31.9*  MCV 100.1* 102.4* 99.7 97.6 97.9  PLT 238.0 237 224 211 205     Urinalysis: Recent Labs     11/21/20 1600  COLORURINE YELLOW*  LABSPEC 1.009  PHURINE 6.0  GLUCOSEU NEGATIVE  HGBUR MODERATE*  BILIRUBINUR NEGATIVE  KETONESUR NEGATIVE  PROTEINUR 30*  NITRITE NEGATIVE  LEUKOCYTESUR LARGE*      Imaging: No results found.   Medications:    sodium chloride 75 mL/hr at 11/24/20 4765    amiodarone  100 mg Oral QPM   cyanocobalamin  1,000 mcg Intramuscular Q30 days   dorzolamide-timolol  1 drop Both Eyes BID   feeding supplement  237 mL Oral TID BM   levothyroxine  100 mcg Oral QAC breakfast   mirtazapine  15 mg Oral QHS   multivitamin with minerals  1 tablet Oral Daily   pantoprazole  40 mg Oral Daily   pravastatin  20 mg Oral q1800   simethicone  80 mg Oral QID   tamsulosin  0.4 mg Oral Daily    Assessment/ Plan:     Principal Problem:   AKI (acute kidney injury) ([place]) Active Problems:   Hypothyroidism   Hyperlipidemia   Essential hypertension   GERD   Prostate cancer, in remission   PACEMAKER, PERMANENT   Long term (current) use of anticoagulants   Longstanding persistent atrial fibrillation ([name])   Chronic kidney disease (CKD), stage IV (severe) ([place])   Protein-calorie  malnutrition, severe  #1 acute kidney injury on the top of chronic kidney disease.  Most likely due to obstructive disease.  GU note appreciated.  Scheduled for OR Monday/Tuesday.  GFR slightly improved.  No acute need for renal replacement therapy.   #2: Congestive heart failure: Diuretics were placed on hold at this time.  We will continue to monitor closely.   #3: Hyperkalemia: Has improved significantly with bicarbonate drip.  We will change IV fluids to normal saline at 75 cc an hour.  Spoke to the patient's daughter at bedside in detail.   LOS: [place], MD [facility] 8/28/202210:48 AM

## 2020-11-25 DIAGNOSIS — N179 Acute kidney failure, unspecified: Secondary | ICD-10-CM | POA: Diagnosis not present

## 2020-11-25 DIAGNOSIS — N35919 Unspecified urethral stricture, male, unspecified site: Secondary | ICD-10-CM | POA: Diagnosis not present

## 2020-11-25 LAB — CBC WITH DIFFERENTIAL/PLATELET
Abs Immature Granulocytes: 0.02 10*3/uL (ref 0.00–0.07)
Basophils Absolute: 0.1 10*3/uL (ref 0.0–0.1)
Basophils Relative: 1 %
Eosinophils Absolute: 0.4 10*3/uL (ref 0.0–0.5)
Eosinophils Relative: 6 %
HCT: 31.9 % — ABNORMAL LOW (ref 39.0–52.0)
Hemoglobin: 10.1 g/dL — ABNORMAL LOW (ref 13.0–17.0)
Immature Granulocytes: 0 %
Lymphocytes Relative: 22 %
Lymphs Abs: 1.4 10*3/uL (ref 0.7–4.0)
MCH: 31.6 pg (ref 26.0–34.0)
MCHC: 31.7 g/dL (ref 30.0–36.0)
MCV: 99.7 fL (ref 80.0–100.0)
Monocytes Absolute: 0.9 10*3/uL (ref 0.1–1.0)
Monocytes Relative: 14 %
Neutro Abs: 3.7 10*3/uL (ref 1.7–7.7)
Neutrophils Relative %: 57 %
Platelets: 201 10*3/uL (ref 150–400)
RBC: 3.2 MIL/uL — ABNORMAL LOW (ref 4.22–5.81)
RDW: 15.4 % (ref 11.5–15.5)
WBC: 6.4 10*3/uL (ref 4.0–10.5)
nRBC: 0 % (ref 0.0–0.2)

## 2020-11-25 LAB — BASIC METABOLIC PANEL
Anion gap: 6 (ref 5–15)
BUN: 80 mg/dL — ABNORMAL HIGH (ref 8–23)
CO2: 23 mmol/L (ref 22–32)
Calcium: 8.1 mg/dL — ABNORMAL LOW (ref 8.9–10.3)
Chloride: 111 mmol/L (ref 98–111)
Creatinine, Ser: 4.66 mg/dL — ABNORMAL HIGH (ref 0.61–1.24)
GFR, Estimated: 11 mL/min — ABNORMAL LOW (ref >60)
Glucose, Bld: 105 mg/dL — ABNORMAL HIGH (ref 70–99)
Potassium: 3.4 mmol/L — ABNORMAL LOW (ref 3.5–5.1)
Sodium: 140 mmol/L (ref 135–145)

## 2020-11-25 LAB — PROTIME-INR
INR: 1.6 — ABNORMAL HIGH (ref 0.8–1.2)
Prothrombin Time: 18.6 seconds — ABNORMAL HIGH (ref 11.4–15.2)

## 2020-11-25 MED ORDER — SODIUM CHLORIDE 0.9 % IV SOLN
1.0000 g | INTRAVENOUS | Status: AC
  Administered 2020-11-26: 1 g via INTRAVENOUS
  Filled 2020-11-25: qty 1
  Filled 2020-11-25: qty 10

## 2020-11-25 MED ORDER — POTASSIUM CHLORIDE CRYS ER 20 MEQ PO TBCR
40.0000 meq | EXTENDED_RELEASE_TABLET | Freq: Once | ORAL | Status: AC
  Administered 2020-11-25: 40 meq via ORAL
  Filled 2020-11-25: qty 2

## 2020-11-25 NOTE — Progress Notes (Signed)
[facility]  PROGRESS NOTE   Subjective:   Patient seen resting in bed Alert and oriented Tolerating meals Denies nausea, vomiting and shortness of breath   Objective:  Vital signs in last 24 hours:  Temp:  [98.4 F (36.9 C)-99 F (37.2 C)] 98.4 F (36.9 C) (08/29 0742) Pulse Rate:  [62-68] 62 (08/29 0742) Resp:  [18-20] 20 (08/29 0742) BP: (112-125)/(56-67) 115/66 (08/29 0742) SpO2:  [89 %-94 %] 94 % (08/29 0742) Weight:  [61.1 kg] 61.1 kg (08/29 0416)  Weight change: -2 kg Filed Weights   11/23/20 0500 11/24/20 0500 11/25/20 0416  Weight: 61 kg 63.1 kg 61.1 kg    Intake/Output: I/O last 3 completed shifts: In: 2985.9 [P.O.:380; I.V.:2605.9] Out: 1800 [Urine:1800]   Intake/Output this shift:  Total I/O In: 0  Out: 500 [Urine:500]  Physical Exam: General:  No acute distress  Head:  Normocephalic, atraumatic. Moist oral mucosal membranes  Eyes:  Anicteric  Neck:  Supple  Lungs:   Clear to auscultation, normal effort  Heart:  S1S2 no rubs  Abdomen:   Soft, nontender, bowel sounds present  Extremities:  peripheral edema.  Neurologic:  Awake, alert, following commands  Skin:  No lesions       Basic Metabolic Panel: Recent Labs  Lab 11/21/20 1427 11/22/20 0444 11/23/20 0744 11/24/20 0808 11/25/20 0404  NA 137 137 139 140 140  K 5.3* 4.1 3.4* 3.6 3.4*  CL 112* 108 102 106 111  CO2 13* 20* 26 25 23   GLUCOSE 131* 138* 102* 88 105*  BUN 96* 89* 83* 85* 80*  CREATININE 5.05* 4.77* 4.60* 4.55* 4.66*  CALCIUM 8.9 8.1* 8.3* 8.4* 8.1*  PHOS  --  6.8*  --   --   --      CBC: Recent Labs  Lab 11/20/20 1536 11/21/20 1427 11/22/20 0444 11/23/20 0744 11/24/20 0808 11/25/20 0404  WBC 6.3 6.6 6.3 6.9 6.2 6.4  NEUTROABS 4.6 4.7  --  4.3 3.6 3.7  HGB 10.8* 10.9* 10.2* 11.0* 10.5* 10.1*  HCT 33.7* 34.8* 31.9* 33.1* 31.9* 31.9*  MCV 100.1* 102.4* 99.7 97.6 97.9 99.7  PLT 238.0 237 224 211 205 201      Urinalysis: No results for  input(s): COLORURINE, LABSPEC, PHURINE, GLUCOSEU, HGBUR, BILIRUBINUR, KETONESUR, PROTEINUR, UROBILINOGEN, NITRITE, LEUKOCYTESUR in the last 72 hours.  Invalid input(s): APPERANCEUR     Imaging: No results found.   Medications:    sodium chloride 75 mL/hr at 11/25/20 0951   [START ON 11/26/2020] cefTRIAXone (ROCEPHIN)  IV      amiodarone  100 mg Oral QPM   cyanocobalamin  1,000 mcg Intramuscular Q30 days   dorzolamide-timolol  1 drop Both Eyes BID   feeding supplement  237 mL Oral TID BM   levothyroxine  100 mcg Oral QAC breakfast   mirtazapine  15 mg Oral QHS   multivitamin with minerals  1 tablet Oral Daily   pantoprazole  40 mg Oral Daily   pravastatin  20 mg Oral q1800   simethicone  80 mg Oral QID   tamsulosin  0.4 mg Oral Daily    Assessment/ Plan:     Principal Problem:   AKI (acute kidney injury) ([place]) Active Problems:   Hypothyroidism   Hyperlipidemia   Essential hypertension   GERD   Prostate cancer, in remission   PACEMAKER, PERMANENT   Long term (current) use of anticoagulants   Longstanding persistent atrial fibrillation ([name])   Chronic kidney disease (CKD), stage IV (severe) ([place])  Protein-calorie malnutrition, severe  #1 acute kidney injury on the top of chronic kidney disease.  Most likely due to obstructive disease.  GU note appreciated.  Scheduled for OR Tuesday for cystoscopy and balloon dilations with possible DVIU.Marland Kitchen  No acute need for renal replacement therapy. Creatinine stable. Will continue to monitor    #2: Congestive heart failure: Diuretics were placed on hold at this time.  We will continue to monitor closely.   #3: Hyperkalemia: Currently 3.4. normal saline at 75 cc an hour.   LOS: [place], MD [facility] 8/29/20225:42 PM

## 2020-11-25 NOTE — Progress Notes (Signed)
Urology Inpatient Progress Note  Subjective: No acute events overnight.  He is afebrile, VSS. Creatinine stable today, 4.66.  Hemoglobin stable, 10.1.  INR down today, 1.6. Patient reports stable dysuria and weak stream consistent with baseline but feels he is emptying well.  No acute concerns today.  Anti-infectives: Anti-infectives (From admission, onward)    None       Current Facility-Administered Medications  Medication Dose Route Frequency Provider Last Rate Last Admin   0.9 %  sodium chloride infusion   Intravenous Continuous Lyla Son, MD 75 mL/hr at 11/25/20 0559 Infusion Verify at 11/25/20 0559   acetaminophen (TYLENOL) tablet 650 mg  650 mg Oral Q6H PRN Elwyn Reach, MD   650 mg at 11/22/20 2155   Or   acetaminophen (TYLENOL) suppository 650 mg  650 mg Rectal Q6H PRN Elwyn Reach, MD       amiodarone (PACERONE) tablet 100 mg  100 mg Oral QPM Elwyn Reach, MD   100 mg at 11/24/20 1718   calcium carbonate (TUMS - dosed in mg elemental calcium) chewable tablet 500 mg of elemental calcium  500 mg of elemental calcium Oral Q6H PRN Elwyn Reach, MD       camphor-menthol (SARNA) lotion 1 application  1 application Topical W4O PRN Elwyn Reach, MD       And   hydrOXYzine (ATARAX/VISTARIL) tablet 25 mg  25 mg Oral Q8H PRN Elwyn Reach, MD       cholestyramine light (PREVALITE) packet 4 g  4 g Oral BID PRN Ralene Muskrat B, MD       cyanocobalamin ((VITAMIN B-12)) injection 1,000 mcg  1,000 mcg Intramuscular Q30 days Gala Romney L, MD   1,000 mcg at 11/22/20 0930   docusate sodium (COLACE) capsule 50 mg  50 mg Oral BID PRN Elwyn Reach, MD       docusate sodium (ENEMEEZ) enema 283 mg  1 enema Rectal PRN Elwyn Reach, MD       dorzolamide-timolol (COSOPT) 22.3-6.8 MG/ML ophthalmic solution 1 drop  1 drop Both Eyes BID Gala Romney L, MD   1 drop at 11/24/20 2008   feeding supplement (ENSURE ENLIVE / ENSURE PLUS) liquid 237 mL   237 mL Oral TID BM Sreenath, Sudheer B, MD   237 mL at 11/24/20 0914   hydrALAZINE (APRESOLINE) injection 10 mg  10 mg Intravenous Q4H PRN Ralene Muskrat B, MD       levothyroxine (SYNTHROID) tablet 100 mcg  100 mcg Oral QAC breakfast Gala Romney L, MD   100 mcg at 11/24/20 0518   loperamide (IMODIUM) capsule 2 mg  2 mg Oral PRN Elwyn Reach, MD       mirtazapine (REMERON) tablet 15 mg  15 mg Oral QHS Gala Romney L, MD   15 mg at 11/24/20 2006   multivitamin with minerals tablet 1 tablet  1 tablet Oral Daily Ralene Muskrat B, MD   1 tablet at 11/24/20 0914   ondansetron (ZOFRAN) tablet 4 mg  4 mg Oral Q6H PRN Elwyn Reach, MD       Or   ondansetron (ZOFRAN) injection 4 mg  4 mg Intravenous Q6H PRN Elwyn Reach, MD       pantoprazole (PROTONIX) EC tablet 40 mg  40 mg Oral Daily Gala Romney L, MD   40 mg at 11/24/20 0914   polyethylene glycol (MIRALAX / GLYCOLAX) packet 17 g  17 g Oral Daily PRN Gala Romney  L, MD       pravastatin (PRAVACHOL) tablet 20 mg  20 mg Oral q1800 Gala Romney L, MD   20 mg at 11/24/20 1719   simethicone (MYLICON) chewable tablet 80 mg  80 mg Oral QID Elwyn Reach, MD   80 mg at 11/24/20 2006   sorbitol 70 % solution 30 mL  30 mL Oral PRN Elwyn Reach, MD       tamsulosin (FLOMAX) capsule 0.4 mg  0.4 mg Oral Daily Gala Romney L, MD   0.4 mg at 11/24/20 0914   traMADol (ULTRAM) tablet 50 mg  50 mg Oral Q6H PRN Elwyn Reach, MD       zolpidem (AMBIEN) tablet 5 mg  5 mg Oral QHS PRN Elwyn Reach, MD       Objective: Vital signs in last 24 hours: Temp:  [98.3 F (36.8 C)-99 F (37.2 C)] 98.4 F (36.9 C) (08/29 0742) Pulse Rate:  [62-68] 62 (08/29 0742) Resp:  [18-20] 20 (08/29 0742) BP: (102-125)/(56-67) 115/66 (08/29 0742) SpO2:  [89 %-95 %] 94 % (08/29 0742) Weight:  [61.1 kg] 61.1 kg (08/29 0416)  Intake/Output from previous day: 08/28 0701 - 08/29 0700 In: 2785.9 [P.O.:180; I.V.:2605.9] Out: 800  [Urine:800] Intake/Output this shift: No intake/output data recorded.  Physical Exam Vitals and nursing note reviewed.  Constitutional:      General: He is not in acute distress.    Appearance: He is not ill-appearing, toxic-appearing or diaphoretic.  HENT:     Head: Normocephalic and atraumatic.  Pulmonary:     Effort: Pulmonary effort is normal. No respiratory distress.  Skin:    General: Skin is warm and dry.  Neurological:     Mental Status: He is alert and oriented to person, place, and time.  Psychiatric:        Mood and Affect: Mood normal.        Behavior: Behavior normal.   Lab Results:  Recent Labs    11/24/20 0808 11/25/20 0404  WBC 6.2 6.4  HGB 10.5* 10.1*  HCT 31.9* 31.9*  PLT 205 201   BMET Recent Labs    11/24/20 0808 11/25/20 0404  NA 140 140  K 3.6 3.4*  CL 106 111  CO2 25 23  GLUCOSE 88 105*  BUN 85* 80*  CREATININE 4.55* 4.66*  CALCIUM 8.4* 8.1*   PT/INR Recent Labs    11/24/20 0432 11/25/20 0404  LABPROT 22.6* 18.6*  INR 2.0* 1.6*   Assessment & Plan: Comorbid 87 year old male with recurrent urethral stricture disease as well as chronic mesh erosion into the bladder admitted with likely multifactorial AKI, creatinine stable.  INR is downtrending as anticoagulation is being held and he continues to void spontaneously.  We discussed plans to proceed to the OR tomorrow with Dr. Bernardo Heater for cystoscopy and balloon dilation with possible DVIU.  We discussed that he will have a Foley catheter in place postoperatively to allow for healing.  Patient expressed understanding and wishes to proceed.  Recommendations: -Continue to hold Coumadin and monitor INR -N.p.o. at midnight -Cystoscopy and balloon dilation with possible DVIU with Dr. Bernardo Heater tomorrow  Debroah Loop, PA-C 11/25/2020

## 2020-11-25 NOTE — Progress Notes (Signed)
PROGRESS NOTE    [name] SIPPEL  JKK:[id number] DOB: January 06, 1923 DOA: 11/21/2020 PCP: Owens Loffler, MD    Brief Narrative:  87 y.o. male with medical history significant of essential hypertension, chronic kidney disease stage IV, symptomatic bradycardia with pacemaker in place, hypertension lipidemia, prostate cancer, paroxysmal atrial fibrillation, diverticular disease GERD and multiple other medical problems who is currently on Coumadin.  Patient presented the ER secondary to having abnormal labs.  His daughter brought him in and mentioned that he has been gradually declining over the last couple of weeks.  Patient has had multiple falls in the last 1 month.  He had small rib fracture with pneumothorax.  Was on the service cardiovascular surgery at the time.  Patient continue to follow with PCP.  He was found to have worsening renal function although he had chronic kidney disease stage III at the time.  His intake has been declining.  He is also not been himself.  He did mention that he had dysuria with some abdominal flank.  No hematuria.  No diarrhea no vomiting.  Patient now has worsening renal failure with creatinine more than 5.  Admitted to the medicine service with nephrology and urology aware.  AKI likely multifactorial.  Per urology obstructive uropathy and associated AKI is unlikely due to lack of hydronephrosis on imaging.  Patient's creatinine is improving while on IV fluids.    Plan for the OR 8/30 with urology for cystoscopy and balloon dilatation.  Patient will likely have Foley catheter in place postoperatively.   Assessment & Plan:   Principal Problem:   AKI (acute kidney injury) ([place]) Active Problems:   Hypothyroidism   Hyperlipidemia   Essential hypertension   GERD   Prostate cancer, in remission   PACEMAKER, PERMANENT   Long term (current) use of anticoagulants   Longstanding persistent atrial fibrillation ([name])   Chronic kidney disease (CKD), stage IV (severe)  ([name])   Protein-calorie malnutrition, severe  AKI on CKD stage IIIb Metabolic acidosis, improved Multifactorial, prerenal versus obstructive uropathy Poor p.o. intake, recent hospitalizations Plan: Continue normal saline 75 cc/h Daily BMP Continue Flomax Appreciate nephrology input Appreciate urology input OR tomorrow Coumadin on hold N.p.o. after midnight Check a.m. INR  Paroxysmal atrial fibrillation Currently controlled on amiodarone Was therapeutic on warfarin Coumadin on hold in anticipation of surgical procedure May need vitamin K/FFP if INR remains elevated 8/28: INR 2.0 8/29: INR 1.6  Possible UTI Leukocytes on UA No other clinical indications of infection Suspect asymptomatic bacteriuria No indication for antibiotics at this time  Essential hypertension Lasix on hold As needed hydralazine  GERD PPI  Hyperlipidemia Pravastatin  Hypothyroidism Synthroid   DVT prophylaxis: SCDs Code Status: Full Family Communication: Daughter at bedside 8/26, 8/27, 8/29 Disposition Plan: Status is: Inpatient  Remains inpatient appropriate because:Inpatient level of care appropriate due to severity of illness  Dispo: The patient is from: Home              Anticipated d/c is to: Home              Patient currently is not medically stable to d/c.   Difficult to place patient No       Level of care: Med-Surg  Consultants:  Nephrology Urology  Procedures: None  Antimicrobials:  None   Subjective: Patient seen and examined.  Laying in bed.  No visible distress.  Answers all questions appropriately  Objective: Vitals:   11/24/20 1747 11/25/20 0414 11/25/20 0416 11/25/20 0742  BP: Marland Kitchen)  112/56 125/67  115/66  Pulse: 68 65  62  Resp: 18 18  20   Temp: 99 F (37.2 C) 98.7 F (37.1 C)  98.4 F (36.9 C)  TempSrc:    Oral  SpO2: 93% (!) 89% 93% 94%  Weight:   61.1 kg   Height:        Intake/Output Summary (Last 24 hours) at 11/25/2020 1137 Last data  filed at 11/25/2020 0715 Gross per 24 hour  Intake 2785.91 ml  Output 800 ml  Net 1985.91 ml   Filed Weights   11/23/20 0500 11/24/20 0500 11/25/20 0416  Weight: 61 kg 63.1 kg 61.1 kg    Examination:  General exam: Lying comfortably in bed.  No visible distress Respiratory system: Lungs clear.  Normal work of breathing.  Room air Cardiovascular system: S1-S2, RRR, 2/6 murmur, no pedal edema  gastrointestinal system: Soft, nontender, nondistended, normal bowel sounds Central nervous system: Alert and oriented. No focal neurological deficits. Extremities: Symmetric 5 x 5 power. Skin: No rashes, lesions or ulcers Psychiatry: Judgement and insight appear normal. Mood & affect appropriate.     Data Reviewed: I have personally reviewed following labs and imaging studies  CBC: Recent Labs  Lab 11/20/20 1536 11/21/20 1427 11/22/20 0444 11/23/20 0744 11/24/20 0808 11/25/20 0404  WBC 6.3 6.6 6.3 6.9 6.2 6.4  NEUTROABS 4.6 4.7  --  4.3 3.6 3.7  HGB 10.8* 10.9* 10.2* 11.0* 10.5* 10.1*  HCT 33.7* 34.8* 31.9* 33.1* 31.9* 31.9*  MCV 100.1* 102.4* 99.7 97.6 97.9 99.7  PLT 238.0 237 224 211 205 355   Basic Metabolic Panel: Recent Labs  Lab 11/21/20 1427 11/22/20 0444 11/23/20 0744 11/24/20 0808 11/25/20 0404  NA 137 137 139 140 140  K 5.3* 4.1 3.4* 3.6 3.4*  CL 112* 108 102 106 111  CO2 13* 20* 26 25 23   GLUCOSE 131* 138* 102* 88 105*  BUN 96* 89* 83* 85* 80*  CREATININE 5.05* 4.77* 4.60* 4.55* 4.66*  CALCIUM 8.9 8.1* 8.3* 8.4* 8.1*  PHOS  --  6.8*  --   --   --    GFR: Estimated Creatinine Clearance: 8.2 mL/min (A) (by C-G formula based on SCr of 4.66 mg/dL (H)). Liver Function Tests: Recent Labs  Lab 11/20/20 1536 11/21/20 1427 11/22/20 0444  AST 17 18 17   ALT 14 16 15   ALKPHOS 108 99 93  BILITOT 0.5 0.8 0.8  PROT 7.0 7.0 6.3*  ALBUMIN 3.5 3.3* 2.9*   No results for input(s): LIPASE, AMYLASE in the last 168 hours. No results for input(s): AMMONIA in the  last 168 hours. Coagulation Profile: Recent Labs  Lab 11/21/20 1559 11/22/20 0444 11/23/20 0950 11/24/20 0432 11/25/20 0404  INR 2.8* 3.0* 3.0* 2.0* 1.6*   Cardiac Enzymes: No results for input(s): CKTOTAL, CKMB, CKMBINDEX, TROPONINI in the last 168 hours. BNP (last 3 results) No results for input(s): PROBNP in the last 8760 hours. HbA1C: No results for input(s): HGBA1C in the last 72 hours. CBG: No results for input(s): GLUCAP in the last 168 hours. Lipid Profile: No results for input(s): CHOL, HDL, LDLCALC, TRIG, CHOLHDL, LDLDIRECT in the last 72 hours. Thyroid Function Tests: No results for input(s): TSH, T4TOTAL, FREET4, T3FREE, THYROIDAB in the last 72 hours. Anemia Panel: No results for input(s): VITAMINB12, FOLATE, FERRITIN, TIBC, IRON, RETICCTPCT in the last 72 hours. Sepsis Labs: No results for input(s): PROCALCITON, LATICACIDVEN in the last 168 hours.  Recent Results (from the past 240 hour(s))  Resp Panel by RT-PCR (  Flu A&B, Covid) Nasopharyngeal Swab     Status: None   Collection Time: 11/21/20  3:59 PM   Specimen: Nasopharyngeal Swab; Nasopharyngeal(NP) swabs in vial transport medium  Result Value Ref Range Status   SARS Coronavirus 2 by RT PCR NEGATIVE NEGATIVE Final    Comment: (NOTE) SARS-CoV-2 target nucleic acids are NOT DETECTED.  The SARS-CoV-2 RNA is generally detectable in upper respiratory specimens during the acute phase of infection. The lowest concentration of SARS-CoV-2 viral copies this assay can detect is 138 copies/mL. A negative result does not preclude SARS-Cov-2 infection and should not be used as the sole basis for treatment or other patient management decisions. A negative result may occur with  improper specimen collection/handling, submission of specimen other than nasopharyngeal swab, presence of viral mutation(s) within the areas targeted by this assay, and inadequate number of viral copies(<138 copies/mL). A negative result must be  combined with clinical observations, patient history, and epidemiological information. The expected result is Negative.  Fact Sheet for Patients:  EntrepreneurPulse.com.au  Fact Sheet for Healthcare Providers:  IncredibleEmployment.be  This test is no t yet approved or cleared by the [place] FDA and  has been authorized for detection and/or diagnosis of SARS-CoV-2 by FDA under an Emergency Use Authorization (EUA). This EUA will remain  in effect (meaning this test can be used) for the duration of the COVID-19 declaration under Section 564(b)(1) of the Act, 21 U.S.C.section 360bbb-3(b)(1), unless the authorization is terminated  or revoked sooner.       Influenza A by PCR NEGATIVE NEGATIVE Final   Influenza B by PCR NEGATIVE NEGATIVE Final    Comment: (NOTE) The Xpert Xpress SARS-CoV-2/FLU/RSV plus assay is intended as an aid in the diagnosis of influenza from Nasopharyngeal swab specimens and should not be used as a sole basis for treatment. Nasal washings and aspirates are unacceptable for Xpert Xpress SARS-CoV-2/FLU/RSV testing.  Fact Sheet for Patients: EntrepreneurPulse.com.au  Fact Sheet for Healthcare Providers: IncredibleEmployment.be  This test is not yet approved or cleared by the [place] FDA and has been authorized for detection and/or diagnosis of SARS-CoV-2 by FDA under an Emergency Use Authorization (EUA). This EUA will remain in effect (meaning this test can be used) for the duration of the COVID-19 declaration under Section 564(b)(1) of the Act, 21 U.S.C. section 360bbb-3(b)(1), unless the authorization is terminated or revoked.  Performed at [facility], [place], [place] [address]   C Difficile Quick Screen w PCR reflex     Status: None   Collection Time: 11/23/20  4:45 PM   Specimen: STOOL  Result Value Ref Range Status   C Diff antigen  NEGATIVE NEGATIVE Final   C Diff toxin NEGATIVE NEGATIVE Final   C Diff interpretation No C. difficile detected.  Final    Comment: Performed at [facility], [address] [place], [place] [address]  Gastrointestinal Panel by PCR , Stool     Status: None   Collection Time: 11/23/20  4:45 PM   Specimen: Stool  Result Value Ref Range Status   Campylobacter species NOT DETECTED NOT DETECTED Final   Plesimonas shigelloides NOT DETECTED NOT DETECTED Final   Salmonella species NOT DETECTED NOT DETECTED Final   Yersinia enterocolitica NOT DETECTED NOT DETECTED Final   Vibrio species NOT DETECTED NOT DETECTED Final   Vibrio cholerae NOT DETECTED NOT DETECTED Final   Enteroaggregative E coli (EAEC) NOT DETECTED NOT DETECTED Final   Enteropathogenic E coli (EPEC) NOT DETECTED  NOT DETECTED Final   Enterotoxigenic E coli (ETEC) NOT DETECTED NOT DETECTED Final   Shiga like toxin producing E coli (STEC) NOT DETECTED NOT DETECTED Final   Shigella/Enteroinvasive E coli (EIEC) NOT DETECTED NOT DETECTED Final   Cryptosporidium NOT DETECTED NOT DETECTED Final   Cyclospora cayetanensis NOT DETECTED NOT DETECTED Final   Entamoeba histolytica NOT DETECTED NOT DETECTED Final   Giardia lamblia NOT DETECTED NOT DETECTED Final   Adenovirus F40/41 NOT DETECTED NOT DETECTED Final   Astrovirus NOT DETECTED NOT DETECTED Final   Norovirus GI/GII NOT DETECTED NOT DETECTED Final   Rotavirus A NOT DETECTED NOT DETECTED Final   Sapovirus (I, II, IV, and V) NOT DETECTED NOT DETECTED Final    Comment: Performed at [facility], [address] [place], [place] [address]         Radiology Studies: No results found.      Scheduled Meds:  amiodarone  100 mg Oral QPM   cyanocobalamin  1,000 mcg Intramuscular Q30 days   dorzolamide-timolol  1 drop Both Eyes BID   feeding supplement  237 mL Oral TID BM   levothyroxine  100 mcg Oral QAC breakfast   mirtazapine  15 mg Oral QHS    multivitamin with minerals  1 tablet Oral Daily   pantoprazole  40 mg Oral Daily   pravastatin  20 mg Oral q1800   simethicone  80 mg Oral QID   tamsulosin  0.4 mg Oral Daily   Continuous Infusions:  sodium chloride 75 mL/hr at 11/25/20 0951     LOS: 4 days    Time spent: 25 minutes    Sidney Ace, MD [facility] Pager [phone]  If 7PM-7AM, please contact night-coverage 11/25/2020, 11:37 AM

## 2020-11-25 NOTE — TOC Progression Note (Signed)
Transition of Care [facility]) - Progression Note    Patient Details  Name: [name] [name] MRN: [id number] Date of Birth: July 09, 1922  Transition of Care [facility]) CM/SW Contact  Beverly Sessions, RN Phone Number: 11/25/2020, 9:31 AM  Clinical Narrative:     Rosalie Gums [phone] A Reached out to [facility] with [place] to determine if she has reviewed.  Awaiting response   Expected Discharge Plan: [place] Barriers to Discharge: Continued Medical Work up  Expected Discharge Plan and Services Expected Discharge Plan: [place] arrangements for the past 2 months: Single Family Home                                       Social Determinants of Health (SDOH) Interventions    Readmission Risk Interventions Readmission Risk Prevention Plan 11/23/2020  Transportation Screening Complete  PCP or Specialist Appt within 3-5 Days Complete  HRI or [place] Complete  Social Work Consult for [place] Planning/Counseling Complete  Palliative Care Screening Not Applicable  Medication Review Press photographer) Complete  Some recent data might be hidden

## 2020-11-25 NOTE — Care Management Important Message (Signed)
Important Message  Patient Details  Name: [name] [name] MRN: [id number] Date of Birth: 10-Jul-1922   Medicare Important Message Given:  Yes     Dannette Barbara 11/25/2020, 2:17 PM

## 2020-11-25 NOTE — TOC Progression Note (Signed)
Transition of Care [facility]) - Progression Note    Patient Details  Name: [name] [name] MRN: [id number] Date of Birth: March 23, 1923  Transition of Care [facility]) CM/SW Contact  Beverly Sessions, RN Phone Number: 11/25/2020, 12:16 PM  Clinical Narrative:     Patient resting soundly at this time Call to daughter to present bed offers.  She accepts the offer at [facility].  Accepted in [place], and notified Magda Paganini at [facility].   Per MD earliest patient would be medically clear for discharge would be Wednesday. Patient not listed in [place] at [facility] notified so she can start [place] when time Expected Discharge Plan: [place] Barriers to Discharge: Continued Medical Work up  Expected Discharge Plan and Services Expected Discharge Plan: [place] arrangements for the past 2 months: Single Family Home                                       Social Determinants of Health (SDOH) Interventions    Readmission Risk Interventions Readmission Risk Prevention Plan 11/23/2020  Transportation Screening Complete  PCP or Specialist Appt within 3-5 Days Complete  HRI or Home Care Consult Complete  Social Work Consult for [place] Planning/Counseling Complete  Palliative Care Screening Not Applicable  Medication Review Press photographer) Complete  Some recent data might be hidden

## 2020-11-26 ENCOUNTER — Encounter
Admission: EM | Disposition: A | Discharge status: Skilled Nursing Facility | Payer: Medicare Other | Source: Home / Self Care | Attending: Internal Medicine

## 2020-11-26 ENCOUNTER — Encounter: Payer: Self-pay | Admitting: Internal Medicine

## 2020-11-26 ENCOUNTER — Inpatient Hospital Stay: Payer: Medicare Other | Admitting: Anesthesiology

## 2020-11-26 DIAGNOSIS — N35912 Unspecified bulbous urethral stricture, male: Secondary | ICD-10-CM

## 2020-11-26 HISTORY — PX: CYSTOSCOPY WITH DIRECT VISION INTERNAL URETHROTOMY: SHX6637

## 2020-11-26 LAB — CBC WITH DIFFERENTIAL/PLATELET
Abs Immature Granulocytes: 0.03 10*3/uL (ref 0.00–0.07)
Basophils Absolute: 0 10*3/uL (ref 0.0–0.1)
Basophils Relative: 0 %
Eosinophils Absolute: 0.4 10*3/uL (ref 0.0–0.5)
Eosinophils Relative: 5 %
HCT: 34.1 % — ABNORMAL LOW (ref 39.0–52.0)
Hemoglobin: 10.5 g/dL — ABNORMAL LOW (ref 13.0–17.0)
Immature Granulocytes: 0 %
Lymphocytes Relative: 20 %
Lymphs Abs: 1.4 10*3/uL (ref 0.7–4.0)
MCH: 32 pg (ref 26.0–34.0)
MCHC: 30.8 g/dL (ref 30.0–36.0)
MCV: 104 fL — ABNORMAL HIGH (ref 80.0–100.0)
Monocytes Absolute: 1 10*3/uL (ref 0.1–1.0)
Monocytes Relative: 13 %
Neutro Abs: 4.5 10*3/uL (ref 1.7–7.7)
Neutrophils Relative %: 62 %
Platelets: 196 10*3/uL (ref 150–400)
RBC: 3.28 MIL/uL — ABNORMAL LOW (ref 4.22–5.81)
RDW: 15.4 % (ref 11.5–15.5)
WBC: 7.3 10*3/uL (ref 4.0–10.5)
nRBC: 0 % (ref 0.0–0.2)

## 2020-11-26 LAB — BASIC METABOLIC PANEL
Anion gap: 9 (ref 5–15)
BUN: 75 mg/dL — ABNORMAL HIGH (ref 8–23)
CO2: 21 mmol/L — ABNORMAL LOW (ref 22–32)
Calcium: 8.5 mg/dL — ABNORMAL LOW (ref 8.9–10.3)
Chloride: 112 mmol/L — ABNORMAL HIGH (ref 98–111)
Creatinine, Ser: 4.45 mg/dL — ABNORMAL HIGH (ref 0.61–1.24)
GFR, Estimated: 12 mL/min — ABNORMAL LOW (ref >60)
Glucose, Bld: 95 mg/dL (ref 70–99)
Potassium: 4.3 mmol/L (ref 3.5–5.1)
Sodium: 142 mmol/L (ref 135–145)

## 2020-11-26 LAB — PROTIME-INR
INR: 1.4 — ABNORMAL HIGH (ref 0.8–1.2)
Prothrombin Time: 17.4 seconds — ABNORMAL HIGH (ref 11.4–15.2)

## 2020-11-26 SURGERY — CYSTOSCOPY, WITH DIRECT VISION INTERNAL URETHROTOMY
Anesthesia: General

## 2020-11-26 MED ORDER — CHLORHEXIDINE GLUCONATE CLOTH 2 % EX PADS
6.0000 | MEDICATED_PAD | Freq: Every day | CUTANEOUS | Status: DC
  Administered 2020-11-27 – 2020-11-29 (×3): 6 via TOPICAL

## 2020-11-26 MED ORDER — FENTANYL CITRATE (PF) 100 MCG/2ML IJ SOLN
INTRAMUSCULAR | Status: AC
  Filled 2020-11-26: qty 2

## 2020-11-26 MED ORDER — LACTATED RINGERS IV SOLN: INTRAVENOUS | Status: DC | PRN

## 2020-11-26 MED ORDER — PHENYLEPHRINE HCL (PRESSORS) 10 MG/ML IV SOLN
INTRAVENOUS | Status: DC | PRN
  Administered 2020-11-26 (×2): 100 ug via INTRAVENOUS

## 2020-11-26 MED ORDER — ONDANSETRON HCL 4 MG/2ML IJ SOLN
INTRAMUSCULAR | Status: DC | PRN
  Administered 2020-11-26: 4 mg via INTRAVENOUS

## 2020-11-26 MED ORDER — LIDOCAINE HCL (CARDIAC) PF 100 MG/5ML IV SOSY
PREFILLED_SYRINGE | INTRAVENOUS | Status: DC | PRN
  Administered 2020-11-26: 60 mg via INTRAVENOUS

## 2020-11-26 MED ORDER — DEXMEDETOMIDINE (PRECEDEX) IN NS 20 MCG/5ML (4 MCG/ML) IV SYRINGE
PREFILLED_SYRINGE | INTRAVENOUS | Status: AC
  Filled 2020-11-26: qty 5

## 2020-11-26 MED ORDER — SODIUM CHLORIDE 0.9 % IR SOLN
Status: DC | PRN
  Administered 2020-11-26: 3000 mL via INTRAVESICAL

## 2020-11-26 MED ORDER — PROPOFOL 10 MG/ML IV BOLUS
INTRAVENOUS | Status: DC | PRN
  Administered 2020-11-26: 60 mg via INTRAVENOUS

## 2020-11-26 MED ORDER — FENTANYL CITRATE (PF) 100 MCG/2ML IJ SOLN
INTRAMUSCULAR | Status: DC | PRN
  Administered 2020-11-26 (×2): 25 ug via INTRAVENOUS
  Administered 2020-11-26: 12.5 ug via INTRAVENOUS
  Administered 2020-11-26: 25 ug via INTRAVENOUS
  Administered 2020-11-26: 12.5 ug via INTRAVENOUS

## 2020-11-26 MED ORDER — PROPOFOL 10 MG/ML IV BOLUS
INTRAVENOUS | Status: AC
  Filled 2020-11-26: qty 20

## 2020-11-26 SURGICAL SUPPLY — 26 items
BAG DRAIN CYSTO-URO LG1000N (MISCELLANEOUS) ×2 IMPLANT
BAG URINE DRAIN 2000ML AR STRL (UROLOGICAL SUPPLIES) ×2 IMPLANT
BALLN OPTILUME DCB 30X3X75 (BALLOONS) ×2
BALLOON OPTILUME DCB 30X3X75 (BALLOONS) ×1 IMPLANT
BASIN GRAD PLASTIC 32OZ STRL (MISCELLANEOUS) IMPLANT
BRUSH SCRUB EZ 1% IODOPHOR (MISCELLANEOUS) ×2 IMPLANT
CATH FOLEY 2W COUNCIL 5CC 18FR (CATHETERS) ×2 IMPLANT
CATH SET URETHRAL DILATOR (CATHETERS) ×2 IMPLANT
DRAPE 3/4 80X56 (DRAPES) ×2 IMPLANT
ELECT REM PT RETURN 9FT ADLT (ELECTROSURGICAL) ×2
ELECTRODE REM PT RTRN 9FT ADLT (ELECTROSURGICAL) ×1 IMPLANT
GAUZE 4X4 16PLY ~~LOC~~+RFID DBL (SPONGE) ×4 IMPLANT
GOWN STRL REUS W/ TWL XL LVL3 (GOWN DISPOSABLE) ×1 IMPLANT
GOWN STRL REUS W/TWL XL LVL3 (GOWN DISPOSABLE) ×2
KIT TURNOVER CYSTO (KITS) ×2 IMPLANT
MANIFOLD NEPTUNE II (INSTRUMENTS) ×2 IMPLANT
PACK CYSTO AR (MISCELLANEOUS) ×2 IMPLANT
SET CYSTO W/LG BORE CLAMP LF (SET/KITS/TRAYS/PACK) ×2 IMPLANT
SOL PREP PVP 2OZ (MISCELLANEOUS)
SOLUTION PREP PVP 2OZ (MISCELLANEOUS) IMPLANT
SURGILUBE 2OZ TUBE FLIPTOP (MISCELLANEOUS) ×2 IMPLANT
SYR TOOMEY 50ML (SYRINGE) IMPLANT
SYR TOOMEY IRRIG 70ML (MISCELLANEOUS) ×2
SYRINGE TOOMEY IRRIG 70ML (MISCELLANEOUS) ×1 IMPLANT
WATER STERILE IRR 3000ML UROMA (IV SOLUTION) ×2 IMPLANT
WATER STERILE IRR 500ML POUR (IV SOLUTION) ×2 IMPLANT

## 2020-11-26 NOTE — Anesthesia Procedure Notes (Signed)
Procedure Name: LMA Insertion Date/Time: 11/26/2020 3:21 PM Performed by: Lerry Liner, CRNA Pre-anesthesia Checklist: Patient identified, Emergency Drugs available, Suction available and Patient being monitored Patient Re-evaluated:Patient Re-evaluated prior to induction Preoxygenation: Pre-oxygenation with 100% oxygen Induction Type: IV induction Ventilation: Mask ventilation without difficulty LMA: LMA inserted LMA Size: 4.0 Number of attempts: 1 Placement Confirmation: ETT inserted through vocal cords under direct vision Tube secured with: Tape

## 2020-11-26 NOTE — Op Note (Signed)
Preoperative diagnosis:  Anterior urethral stricture  Postoperative diagnosis:  Anterior urethral stricture  Procedure: Cystoscopy with urethral dilation Balloon dilation urethral stricture (Optilume DBC)  Surgeon: Abbie Sons, MD  Anesthesia: General  Complications: None  Intraoperative findings:  Dense stricture bulbous urethra measuring ~ 1.5 cm   EBL: Minimal  Specimens: Urine for culture  Indication: [name] [name] is a 87 y.o. male with recurrent bulbar urethral stricture.  He has a history of mesh erosion from a previous hernia repair and to the anterior bladder wall which was incompletely removed endoscopically and he declined open treatment.  Recent decreased force and caliber of his urinary stream with urinary hesitancy.  Was scheduled for balloon dilation on 8/9 however the procedure was canceled by anesthesia due to a recent fall which had not been evaluated.  After reviewing the management options for treatment, he elected to proceed with the above surgical procedure(s). We have discussed the potential benefits and risks of the procedure, side effects of the proposed treatment, the likelihood of the patient achieving the goals of the procedure, and any potential problems that might occur during the procedure or recuperation. Informed consent has been obtained.  Description of procedure:  The patient was taken to the operating room and general anesthesia was induced.  The patient was placed in the dorsal lithotomy position, prepped and draped in the usual sterile fashion, and preoperative antibiotics were administered. A preoperative time-out was performed.   A 21 French cystoscope with 30 degree lens was lubricated and advanced proximally where the bulbous urethral stricture was identified.  A 0.038 Sensor wire was placed through the cystoscope and through the stricture and advanced in the bladder.  The cystoscope was removed and over-the-wire disposable dilators were  used.  The 10 [place] dilator was passed with moderate resistance however the 12 [place] dilator would not advance and buckled at the beginning of the stricture.  Heyman Bard dilators were then gently advanced over the guidewire from 12-20 [place].  The cystoscope was then readvanced alongside the guidewire.  There was mild oozing at the dilated stricture.  The prostate was nonocclusive.  The scope was advanced in the bladder and the bladder was emptied and urine was collected and sent for culture.  Panendoscopy was performed however visualization was suboptimal.  No gross tumor was identified.  The guidewire was removed and repassed to the cystoscope and the cystoscope was backed into the beginning of the stricture.  A 30 French/30 mm Optilume DBC balloon was advanced over the wire and was positioned at the stricture.  It was inflated to 10 atm and kept inflated for 5 minutes.  The balloon was then deflated and removed.  An 28 [place] [employer] catheter was then advanced over the guidewire without difficulty and the balloon was inflated with 10 cc of sterile water.  Catheter was irrigated with return of pink-tinged effluent.  The catheter was placed to gravity drainage.  After anesthetic reversal he was transported to the PACU in stable condition.  Plan: Recommend Foley catheter drainage x7 days 2-3 week postop follow-up   Abbie Sons, M.D.

## 2020-11-26 NOTE — Transfer of Care (Signed)
Immediate Anesthesia Transfer of Care Note  Patient: [name] [name]  Procedure(s) Performed: CYSTOSCOPY WITH DIRECT VISION INTERNAL URETHROTOMY-OPTILUM  Patient Location: PACU  Anesthesia Type:General  Level of Consciousness: drowsy  Airway & Oxygen Therapy: Patient Spontanous Breathing and Patient connected to face mask oxygen  Post-op Assessment: Report given to RN  Post vital signs: stable  Last Vitals:  Vitals Value Taken Time  BP 128/64 11/26/20 1605  Temp    Pulse 60 11/26/20 1609  Resp 18 11/26/20 1609  SpO2 98 % 11/26/20 1609  Vitals shown include unvalidated device data.  Last Pain:  Vitals:   11/26/20 1409  TempSrc: Tympanic  PainSc: 0-No pain      Patients Stated Pain Goal: 0 (35/07/57 3225)  Complications: No notable events documented.

## 2020-11-26 NOTE — Anesthesia Postprocedure Evaluation (Signed)
Anesthesia Post Note  Patient: [name] [name]  Procedure(s) Performed: CYSTOSCOPY WITH DIRECT VISION INTERNAL URETHROTOMY-OPTILUM  Patient location during evaluation: PACU Anesthesia Type: General Level of consciousness: awake and alert Pain management: pain level controlled Vital Signs Assessment: post-procedure vital signs reviewed and stable Respiratory status: spontaneous breathing, nonlabored ventilation, respiratory function stable and patient connected to nasal cannula oxygen Cardiovascular status: blood pressure returned to baseline and stable Postop Assessment: no apparent nausea or vomiting Anesthetic complications: no   No notable events documented.   Last Vitals:  Vitals:   11/26/20 1615 11/26/20 1630  BP: 90/64 (!) 146/79  Pulse: 76   Resp: 18   Temp:    SpO2: 96%     Last Pain:  Vitals:   11/26/20 1630  TempSrc:   PainSc: 0-No pain                 Arita Miss

## 2020-11-26 NOTE — Progress Notes (Signed)
PROGRESS NOTE    [name] [name]  XQJ:[id number] DOB: [birth year] DOA: 11/21/2020 PCP: Owens Loffler, MD    Brief Narrative:  87 y.o. male with medical history significant of essential hypertension, chronic kidney disease stage IV, symptomatic bradycardia with pacemaker in place, hypertension lipidemia, prostate cancer, paroxysmal atrial fibrillation, diverticular disease GERD and multiple other medical problems who is currently on Coumadin.  Patient presented the ER secondary to having abnormal labs.  His daughter brought him in and mentioned that he has been gradually declining over the last couple of weeks.  Patient has had multiple falls in the last 1 month.  He had small rib fracture with pneumothorax.  Was on the service cardiovascular surgery at the time.  Patient continue to follow with PCP.  He was found to have worsening renal function although he had chronic kidney disease stage III at the time.  His intake has been declining.  He is also not been himself.  He did mention that he had dysuria with some abdominal flank.  No hematuria.  No diarrhea no vomiting.  Patient now has worsening renal failure with creatinine more than 5.  Admitted to the medicine service with nephrology and urology aware.  AKI likely multifactorial.  Per urology obstructive uropathy and associated AKI is unlikely due to lack of hydronephrosis on imaging.  Patient's creatinine is improving while on IV fluids.    Plan for the OR 8/30 with urology for cystoscopy and balloon dilatation.  Patient will likely have Foley catheter in place postoperatively.   Assessment & Plan:   Principal Problem:   AKI (acute kidney injury) ([place]) Active Problems:   Hypothyroidism   Hyperlipidemia   Essential hypertension   GERD   Prostate cancer, in remission   PACEMAKER, PERMANENT   Long term (current) use of anticoagulants   Longstanding persistent atrial fibrillation ([name])   Chronic kidney disease (CKD), stage IV (severe)  ([name])   Protein-calorie malnutrition, severe  AKI on CKD stage IIIb Metabolic acidosis, improved Multifactorial, prerenal versus obstructive uropathy Poor p.o. intake, recent hospitalizations Plan: Continue normal saline 75 cc/h Daily BMP Continue Flomax Appreciate nephrology input OR today with urology Continue holding Coumadin Possible disposition in 24 to 48 hours if kidney function  Paroxysmal atrial fibrillation Currently controlled on amiodarone Was therapeutic on warfarin Coumadin on hold in anticipation of surgical procedure 8/28: INR 2.0 8/29: INR 1.6 8/30: INR one-point  Possible UTI Leukocytes on UA No other clinical indications of infection Suspect asymptomatic bacteriuria No indication for antibiotics at this time  Essential hypertension Lasix on hold As needed hydralazine  GERD PPI  Hyperlipidemia Pravastatin  Hypothyroidism Synthroid   DVT prophylaxis: SCDs Code Status: Full Family Communication: Daughter at bedside 8/26, 8/27, 8/29, 8/30 Disposition Plan: Status is: Inpatient  Remains inpatient appropriate because:Inpatient level of care appropriate due to severity of illness  Dispo: The patient is from: Home              Anticipated d/c is to: Home              Patient currently is not medically stable to d/c.   Difficult to place patient No  OR with urology today     Level of care: Med-Surg  Consultants:  Nephrology Urology  Procedures: None  Antimicrobials:  None   Subjective: Patient seen and examined.  Laying in bed.  No visible distress.  Answers all questions appropriately  Objective: Vitals:   11/25/20 4481 11/25/20 2007 11/26/20 0507 11/26/20 8563  BP: 115/66 131/72 131/70 (!) 141/70  Pulse: 62 62 61 60  Resp: 20 17 15    Temp: 98.4 F (36.9 C) 98.3 F (36.8 C) 98.5 F (36.9 C) 97.9 F (36.6 C)  TempSrc: Oral Oral Oral Oral  SpO2: 94% 95% 93% 99%  Weight:      Height:        Intake/Output Summary  (Last 24 hours) at 11/26/2020 1052 Last data filed at 11/26/2020 0620 Gross per 24 hour  Intake 0 ml  Output 800 ml  Net -800 ml   Filed Weights   11/23/20 0500 11/24/20 0500 11/25/20 0416  Weight: 61 kg 63.1 kg 61.1 kg    Examination:  General exam: Lying in bed.  No distress Respiratory system: Lungs clear.  Normal work of breathing.  Room air Cardiovascular system: S1-S2, RRR, 2/6 murmur, no pedal edema  gastrointestinal system: Soft, NT/ND, normal bowel sound Central nervous system: Alert and oriented. No focal neurological deficits. Extremities: Symmetric 5 x 5 power. Skin: No rashes, lesions or ulcers Psychiatry: Judgement and insight appear normal. Mood & affect appropriate.     Data Reviewed: I have personally reviewed following labs and imaging studies  CBC: Recent Labs  Lab 11/21/20 1427 11/22/20 0444 11/23/20 0744 11/24/20 0808 11/25/20 0404 11/26/20 0423  WBC 6.6 6.3 6.9 6.2 6.4 7.3  NEUTROABS 4.7  --  4.3 3.6 3.7 4.5  HGB 10.9* 10.2* 11.0* 10.5* 10.1* 10.5*  HCT 34.8* 31.9* 33.1* 31.9* 31.9* 34.1*  MCV 102.4* 99.7 97.6 97.9 99.7 104.0*  PLT 237 224 211 205 201 631   Basic Metabolic Panel: Recent Labs  Lab 11/22/20 0444 11/23/20 0744 11/24/20 0808 11/25/20 0404 11/26/20 0423  NA 137 139 140 140 142  K 4.1 3.4* 3.6 3.4* 4.3  CL 108 102 106 111 112*  CO2 20* 26 25 23  21*  GLUCOSE 138* 102* 88 105* 95  BUN 89* 83* 85* 80* 75*  CREATININE 4.77* 4.60* 4.55* 4.66* 4.45*  CALCIUM 8.1* 8.3* 8.4* 8.1* 8.5*  PHOS 6.8*  --   --   --   --    GFR: Estimated Creatinine Clearance: 8.6 mL/min (A) (by C-G formula based on SCr of 4.45 mg/dL (H)). Liver Function Tests: Recent Labs  Lab 11/20/20 1536 11/21/20 1427 11/22/20 0444  AST 17 18 17   ALT 14 16 15   ALKPHOS 108 99 93  BILITOT 0.5 0.8 0.8  PROT 7.0 7.0 6.3*  ALBUMIN 3.5 3.3* 2.9*   No results for input(s): LIPASE, AMYLASE in the last 168 hours. No results for input(s): AMMONIA in the last 168  hours. Coagulation Profile: Recent Labs  Lab 11/22/20 0444 11/23/20 0950 11/24/20 0432 11/25/20 0404 11/26/20 0423  INR 3.0* 3.0* 2.0* 1.6* 1.4*   Cardiac Enzymes: No results for input(s): CKTOTAL, CKMB, CKMBINDEX, TROPONINI in the last 168 hours. BNP (last 3 results) No results for input(s): PROBNP in the last 8760 hours. HbA1C: No results for input(s): HGBA1C in the last 72 hours. CBG: No results for input(s): GLUCAP in the last 168 hours. Lipid Profile: No results for input(s): CHOL, HDL, LDLCALC, TRIG, CHOLHDL, LDLDIRECT in the last 72 hours. Thyroid Function Tests: No results for input(s): TSH, T4TOTAL, FREET4, T3FREE, THYROIDAB in the last 72 hours. Anemia Panel: No results for input(s): VITAMINB12, FOLATE, FERRITIN, TIBC, IRON, RETICCTPCT in the last 72 hours. Sepsis Labs: No results for input(s): PROCALCITON, LATICACIDVEN in the last 168 hours.  Recent Results (from the past 240 hour(s))  Resp Panel by RT-PCR (  Flu A&B, Covid) Nasopharyngeal Swab     Status: None   Collection Time: 11/21/20  3:59 PM   Specimen: Nasopharyngeal Swab; Nasopharyngeal(NP) swabs in vial transport medium  Result Value Ref Range Status   SARS Coronavirus 2 by RT PCR NEGATIVE NEGATIVE Final    Comment: (NOTE) SARS-CoV-2 target nucleic acids are NOT DETECTED.  The SARS-CoV-2 RNA is generally detectable in upper respiratory specimens during the acute phase of infection. The lowest concentration of SARS-CoV-2 viral copies this assay can detect is 138 copies/mL. A negative result does not preclude SARS-Cov-2 infection and should not be used as the sole basis for treatment or other patient management decisions. A negative result may occur with  improper specimen collection/handling, submission of specimen other than nasopharyngeal swab, presence of viral mutation(s) within the areas targeted by this assay, and inadequate number of viral copies(<138 copies/mL). A negative result must be combined  with clinical observations, patient history, and epidemiological information. The expected result is Negative.  Fact Sheet for Patients:  EntrepreneurPulse.com.au  Fact Sheet for Healthcare Providers:  IncredibleEmployment.be  This test is no t yet approved or cleared by the [place] FDA and  has been authorized for detection and/or diagnosis of SARS-CoV-2 by FDA under an Emergency Use Authorization (EUA). This EUA will remain  in effect (meaning this test can be used) for the duration of the COVID-19 declaration under Section 564(b)(1) of the Act, 21 U.S.C.section 360bbb-3(b)(1), unless the authorization is terminated  or revoked sooner.       Influenza A by PCR NEGATIVE NEGATIVE Final   Influenza B by PCR NEGATIVE NEGATIVE Final    Comment: (NOTE) The Xpert Xpress SARS-CoV-2/FLU/RSV plus assay is intended as an aid in the diagnosis of influenza from Nasopharyngeal swab specimens and should not be used as a sole basis for treatment. Nasal washings and aspirates are unacceptable for Xpert Xpress SARS-CoV-2/FLU/RSV testing.  Fact Sheet for Patients: EntrepreneurPulse.com.au  Fact Sheet for Healthcare Providers: IncredibleEmployment.be  This test is not yet approved or cleared by the [place] FDA and has been authorized for detection and/or diagnosis of SARS-CoV-2 by FDA under an Emergency Use Authorization (EUA). This EUA will remain in effect (meaning this test can be used) for the duration of the COVID-19 declaration under Section 564(b)(1) of the Act, 21 U.S.C. section 360bbb-3(b)(1), unless the authorization is terminated or revoked.  Performed at [facility], [place], [place] [address]   C Difficile Quick Screen w PCR reflex     Status: None   Collection Time: 11/23/20  4:45 PM   Specimen: STOOL  Result Value Ref Range Status   C Diff antigen NEGATIVE  NEGATIVE Final   C Diff toxin NEGATIVE NEGATIVE Final   C Diff interpretation No C. difficile detected.  Final    Comment: Performed at [facility], [address] [place], [place] [address]  Gastrointestinal Panel by PCR , Stool     Status: None   Collection Time: 11/23/20  4:45 PM   Specimen: Stool  Result Value Ref Range Status   Campylobacter species NOT DETECTED NOT DETECTED Final   Plesimonas shigelloides NOT DETECTED NOT DETECTED Final   Salmonella species NOT DETECTED NOT DETECTED Final   Yersinia enterocolitica NOT DETECTED NOT DETECTED Final   Vibrio species NOT DETECTED NOT DETECTED Final   Vibrio cholerae NOT DETECTED NOT DETECTED Final   Enteroaggregative E coli (EAEC) NOT DETECTED NOT DETECTED Final   Enteropathogenic E coli (EPEC) NOT DETECTED  NOT DETECTED Final   Enterotoxigenic E coli (ETEC) NOT DETECTED NOT DETECTED Final   Shiga like toxin producing E coli (STEC) NOT DETECTED NOT DETECTED Final   Shigella/Enteroinvasive E coli (EIEC) NOT DETECTED NOT DETECTED Final   Cryptosporidium NOT DETECTED NOT DETECTED Final   Cyclospora cayetanensis NOT DETECTED NOT DETECTED Final   Entamoeba histolytica NOT DETECTED NOT DETECTED Final   Giardia lamblia NOT DETECTED NOT DETECTED Final   Adenovirus F40/41 NOT DETECTED NOT DETECTED Final   Astrovirus NOT DETECTED NOT DETECTED Final   Norovirus GI/GII NOT DETECTED NOT DETECTED Final   Rotavirus A NOT DETECTED NOT DETECTED Final   Sapovirus (I, II, IV, and V) NOT DETECTED NOT DETECTED Final    Comment: Performed at [facility], [address], [place], [place] [address]         Radiology Studies: No results found.      Scheduled Meds:  amiodarone  100 mg Oral QPM   cyanocobalamin  1,000 mcg Intramuscular Q30 days   dorzolamide-timolol  1 drop Both Eyes BID   feeding supplement  237 mL Oral TID BM   levothyroxine  100 mcg Oral QAC breakfast   mirtazapine  15 mg Oral QHS   multivitamin with  minerals  1 tablet Oral Daily   pantoprazole  40 mg Oral Daily   pravastatin  20 mg Oral q1800   simethicone  80 mg Oral QID   tamsulosin  0.4 mg Oral Daily   Continuous Infusions:  sodium chloride 75 mL/hr at 11/26/20 0334   cefTRIAXone (ROCEPHIN)  IV       LOS: 5 days    Time spent: 25 minutes    Sidney Ace, MD [facility] Pager [phone]  If 7PM-7AM, please contact night-coverage 11/26/2020, 10:52 AM

## 2020-11-26 NOTE — Progress Notes (Signed)
OT Cancellation Note  Patient Details Name: [name] [name] MRN: [id number] DOB: 08/07/1922   Cancelled Treatment:    Reason Eval/Treat Not Completed: Patient declined, no reason specified;Fatigue/lethargy limiting ability to participate. Upon arrival pt and family at bedside reporting pt recently completed BSC t/f. Pt reports fatigue and planned procedure later this date, requesting to hold session today. Will continue to follow POC as able.   Dessie Coma, M.S. OTR/L  11/26/20, 10:19 AM  ascom [phone]

## 2020-11-26 NOTE — Progress Notes (Signed)
Physical Therapy Treatment Patient Details Name: [name] [name] MRN: [id number] DOB: May 07, 1922 Today's Date: 11/26/2020    History of Present Illness 87 yo male with onset of many falls was admitted with worsening renal function but had recent rib fractures on L side 7&8 with pneumothorax, now has pleural effusion and abnormal lab values.  Has suspected UTI, malnutrition, incontinence, general weakness.  PMHx:  HTN, CKD 4, bradycardia, pacemaker, prostate CA, PAF, diverticular disease, GERD,    PT Comments    Feeling better.  Awaiting Cystoscopy but willing to participate in PT prior.  OOB with min a x 1.  Steady EOB.  He is able to stand with mod a x 1.  Once standing he is noted to have a small BM and is returned to sitting. Commode obtained and he is able to transfer to/from commode with close min a x 1.  Good steps today but needs hands on assist +1 at all times when standing.  He is able to have a very small BM and does void a thick milky consistency urine.  Returns to supine without physical assist once sitting.  He declines to remain OOB in chair while waiting and is encouraged to get OOB daily with nursing staff.   Follow Up Recommendations  SNF     Equipment Recommendations  None recommended by PT    Recommendations for Other Services       Precautions / Restrictions Precautions Precautions: Fall Restrictions Weight Bearing Restrictions: No    Mobility  Bed Mobility Overal bed mobility: Needs Assistance Bed Mobility: Supine to Sit;Sit to Supine     Supine to sit: Min assist Sit to supine: Min guard        Transfers Overall transfer level: Needs assistance Equipment used: Rolling walker (2 wheeled) Transfers: Sit to/from Stand Sit to Stand: Mod assist            Ambulation/Gait Ambulation/Gait assistance: Min assist Gait Distance (Feet): 3 Feet Assistive device: Rolling walker (2 wheeled) Gait Pattern/deviations: Step-to pattern;Step-through  pattern;Decreased step length - right;Decreased step length - left;Trunk flexed Gait velocity: decreased   General Gait Details: able to tke good steps to/from commode for very small BM and to void   Marine scientist [place] (Stroke Patients Only)       Balance Overall balance assessment: Needs assistance Sitting-balance support: No upper extremity supported;Feet supported Sitting balance-Leahy Scale: Good     Standing balance support: Bilateral upper extremity supported Standing balance-Leahy Scale: Poor                              Cognition Arousal/Alertness: Awake/alert Behavior During Therapy: WFL for tasks assessed/performed Overall Cognitive Status: Within Functional Limits for tasks assessed                                        Exercises      General Comments        Pertinent Vitals/Pain Pain Assessment: No/denies pain    Home Living                      Prior Function            PT Goals (current goals can now be found in the  care plan section) Progress towards PT goals: Progressing toward goals    Frequency    Min 2X/week      PT Plan Current plan remains appropriate    Co-evaluation              AM-PAC PT "6 Clicks" Mobility   Outcome Measure  Help needed turning from your back to your side while in a flat bed without using bedrails?: A Little Help needed moving from lying on your back to sitting on the side of a flat bed without using bedrails?: A Little Help needed moving to and from a bed to a chair (including a wheelchair)?: A Little Help needed standing up from a chair using your arms (e.g., wheelchair or bedside chair)?: A Little Help needed to walk in hospital room?: A Little Help needed climbing 3-5 steps with a railing? : A Lot 6 Click Score: 17    End of Session Equipment Utilized During Treatment: Gait belt Activity Tolerance: Patient  tolerated treatment well Patient left: in bed;with call bell/phone within reach;with bed alarm set Nurse Communication: Mobility status PT Visit Diagnosis: Unsteadiness on feet (R26.81);Muscle weakness (generalized) (M62.81);Repeated falls (R29.6);Difficulty in walking, not elsewhere classified (R26.2)     Time: 8377-9396 PT Time Calculation (min) (ACUTE ONLY): 16 min  Charges:  $Therapeutic Activity: 8-22 mins                    Chesley Noon, PTA 11/26/20, 9:16 AM , 9:13 AM

## 2020-11-26 NOTE — TOC Progression Note (Signed)
Transition of Care [facility]) - Progression Note    Patient Details  Name: [name] [name] MRN: [id number] Date of Birth: 09-Jan-1923  Transition of Care [facility]) CM/SW [place], LCSW Phone Number: 11/26/2020, 11:25 AM  Clinical Narrative:   [facility] [place] started insurance authorization yesterday afternoon. Still pending.  Expected Discharge Plan: Skilled Nursing Facility Barriers to Discharge: Continued Medical Work up  Expected Discharge Plan and Services Expected Discharge Plan: [place] arrangements for the past 2 months: Single Family Home                                       Social Determinants of Health (SDOH) Interventions    Readmission Risk Interventions Readmission Risk Prevention Plan 11/23/2020  Transportation Screening Complete  PCP or Specialist Appt within 3-5 Days Complete  HRI or Home Care Consult Complete  Social Work Consult for [place] Planning/Counseling Complete  Palliative Care Screening Not Applicable  Medication Review Press photographer) Complete  Some recent data might be hidden

## 2020-11-26 NOTE — Anesthesia Preprocedure Evaluation (Addendum)
Anesthesia Evaluation   Patient awake    Reviewed: Allergy & Precautions, H&P , NPO status , Patient's Chart, lab work & pertinent test results, reviewed documented beta blocker date and time   Airway Mallampati: II  TM Distance: >3 FB Neck ROM: full    Dental no notable dental hx.    Pulmonary neg pulmonary ROS,    Pulmonary exam normal        Cardiovascular Exercise Tolerance: Poor hypertension, On Medications Normal cardiovascular exam+ dysrhythmias (s/p failed ablation. Currently controlled on amiodarone) Atrial Fibrillation + pacemaker (h/o symptomatic bradycardia) Cardiac Defibrillator: for symptomatic sinus bradycardia.  Rhythm:regular Rate:Normal  Anticoagulation with warfarin   Neuro/Psych negative neurological ROS  negative psych ROS   GI/Hepatic Neg liver ROS, PUD, GERD  Medicated,  Endo/Other  Hypothyroidism   Renal/GU CRF and ARFRenal disease (AKI on CKD stage IIIb)  negative genitourinary   Musculoskeletal  (+) Arthritis  (neck arthritis),   Abdominal Normal abdominal exam  (+)   Peds negative pediatric ROS (+)  Hematology  (+) Blood dyscrasia, anemia ,   Anesthesia Other Findings S/P multiple falls in the last 1 month.  He had small rib fracture with pneumothorax.  Reproductive/Obstetrics negative OB ROS                          Anesthesia Physical  Anesthesia Plan  ASA: III  Anesthesia Plan: General   Post-op Pain Management:    Induction:   PONV Risk Score and Plan: 2 and Dexamethasone, Ondansetron and Treatment may vary due to age or medical condition  Airway Management Planned: LMA  Additional Equipment:   Intra-op Plan:   Post-operative Plan: Extubation in OR  Informed Consent: I have reviewed the patients History and Physical, chart, labs and discussed the procedure including the risks, benefits and alternatives for the proposed anesthesia with the patient  or authorized representative who has indicated his/her understanding and acceptance.   Patient has DNR.  Suspend DNR.   Dental Advisory Given  Plan Discussed with: CRNA and Anesthesiologist  Anesthesia Plan Comments:        Anesthesia Quick Evaluation

## 2020-11-26 NOTE — Progress Notes (Signed)
[facility]  PROGRESS NOTE   Subjective:   Patient seen resting in bed Alert and oriented Currently NPO for procedure Daughter at bedside concerned about new edema on left arm   Objective:  Vital signs in last 24 hours:  Temp:  [97.9 F (36.6 C)-98.5 F (36.9 C)] 97.9 F (36.6 C) (08/30 0747) Pulse Rate:  [60-62] 60 (08/30 0747) Resp:  [15-17] 15 (08/30 0507) BP: (131-141)/(70-72) 141/70 (08/30 0747) SpO2:  [93 %-99 %] 99 % (08/30 0747)  Weight change:  Filed Weights   11/23/20 0500 11/24/20 0500 11/25/20 0416  Weight: 61 kg 63.1 kg 61.1 kg    Intake/Output: I/O last 3 completed shifts: In: 2785.9 [P.O.:180; I.V.:2605.9] Out: 1600 [Urine:1600]   Intake/Output this shift:  No intake/output data recorded.  Physical Exam: General:  No acute distress  Head:  Normocephalic, atraumatic. Moist oral mucosal membranes  Eyes:  Anicteric  Lungs:   Clear to auscultation, normal effort  Heart:  S1S2 no rubs  Abdomen:   Soft, nontender, bowel sounds present  Extremities:  No BLE peripheral edema, LUE edema, erythema.  Neurologic:  Awake, alert, following commands  Skin:  No lesions       Basic Metabolic Panel: Recent Labs  Lab 11/22/20 0444 11/23/20 0744 11/24/20 0808 11/25/20 0404 11/26/20 0423  NA 137 139 140 140 142  K 4.1 3.4* 3.6 3.4* 4.3  CL 108 102 106 111 112*  CO2 20* 26 25 23  21*  GLUCOSE 138* 102* 88 105* 95  BUN 89* 83* 85* 80* 75*  CREATININE 4.77* 4.60* 4.55* 4.66* 4.45*  CALCIUM 8.1* 8.3* 8.4* 8.1* 8.5*  PHOS 6.8*  --   --   --   --      CBC: Recent Labs  Lab 11/21/20 1427 11/22/20 0444 11/23/20 0744 11/24/20 0808 11/25/20 0404 11/26/20 0423  WBC 6.6 6.3 6.9 6.2 6.4 7.3  NEUTROABS 4.7  --  4.3 3.6 3.7 4.5  HGB 10.9* 10.2* 11.0* 10.5* 10.1* 10.5*  HCT 34.8* 31.9* 33.1* 31.9* 31.9* 34.1*  MCV 102.4* 99.7 97.6 97.9 99.7 104.0*  PLT 237 224 211 205 201 196      Urinalysis: No results for input(s): COLORURINE, LABSPEC,  PHURINE, GLUCOSEU, HGBUR, BILIRUBINUR, KETONESUR, PROTEINUR, UROBILINOGEN, NITRITE, LEUKOCYTESUR in the last 72 hours.  Invalid input(s): APPERANCEUR     Imaging: No results found.   Medications:    sodium chloride 75 mL/hr at 11/26/20 1217   cefTRIAXone (ROCEPHIN)  IV      amiodarone  100 mg Oral QPM   cyanocobalamin  1,000 mcg Intramuscular Q30 days   dorzolamide-timolol  1 drop Both Eyes BID   feeding supplement  237 mL Oral TID BM   levothyroxine  100 mcg Oral QAC breakfast   mirtazapine  15 mg Oral QHS   multivitamin with minerals  1 tablet Oral Daily   pantoprazole  40 mg Oral Daily   pravastatin  20 mg Oral q1800   simethicone  80 mg Oral QID   tamsulosin  0.4 mg Oral Daily    Assessment/ Plan:     Principal Problem:   AKI (acute kidney injury) ([place]) Active Problems:   Hypothyroidism   Hyperlipidemia   Essential hypertension   GERD   Prostate cancer, in remission   PACEMAKER, PERMANENT   Long term (current) use of anticoagulants   Longstanding persistent atrial fibrillation ([name])   Chronic kidney disease (CKD), stage IV (severe) ([name])   Protein-calorie malnutrition, severe  #1 acute kidney injury  on the top of chronic kidney disease.  Most likely due to obstructive disease.  Baseline creatinine 2.71 from 10/16/20. GU note appreciated.  Scheduled for OR today for cystoscopy and balloon dilations with possible DVIU.Marland Kitchen  No acute need for renal replacement therapy. Creatinine/BUN elevated but stable. Will continue to monitor renal function after procedure.   #2: Congestive heart failure: Diuretics were placed on hold at this time.  We will continue to monitor closely.   #3: Hyperkalemia: Currently 4.3. normal saline at 75 cc an hour.   LOS: 5 Colon Flattery, MD [facility] 8/30/20222:01 PM

## 2020-11-27 ENCOUNTER — Inpatient Hospital Stay: Payer: Medicare Other

## 2020-11-27 ENCOUNTER — Encounter: Payer: Self-pay | Admitting: Urology

## 2020-11-27 ENCOUNTER — Telehealth: Payer: Self-pay | Admitting: Urology

## 2020-11-27 DIAGNOSIS — N179 Acute kidney failure, unspecified: Secondary | ICD-10-CM | POA: Diagnosis not present

## 2020-11-27 LAB — CBC WITH DIFFERENTIAL/PLATELET
Abs Immature Granulocytes: 0.03 10*3/uL (ref 0.00–0.07)
Basophils Absolute: 0 10*3/uL (ref 0.0–0.1)
Basophils Relative: 0 %
Eosinophils Absolute: 0 10*3/uL (ref 0.0–0.5)
Eosinophils Relative: 0 %
HCT: 34 % — ABNORMAL LOW (ref 39.0–52.0)
Hemoglobin: 10.2 g/dL — ABNORMAL LOW (ref 13.0–17.0)
Immature Granulocytes: 0 %
Lymphocytes Relative: 9 %
Lymphs Abs: 0.9 10*3/uL (ref 0.7–4.0)
MCH: 31.7 pg (ref 26.0–34.0)
MCHC: 30 g/dL (ref 30.0–36.0)
MCV: 105.6 fL — ABNORMAL HIGH (ref 80.0–100.0)
Monocytes Absolute: 0.6 10*3/uL (ref 0.1–1.0)
Monocytes Relative: 7 %
Neutro Abs: 7.7 10*3/uL (ref 1.7–7.7)
Neutrophils Relative %: 84 %
Platelets: 201 10*3/uL (ref 150–400)
RBC: 3.22 MIL/uL — ABNORMAL LOW (ref 4.22–5.81)
RDW: 15.2 % (ref 11.5–15.5)
WBC: 9.3 10*3/uL (ref 4.0–10.5)
nRBC: 0 % (ref 0.0–0.2)

## 2020-11-27 LAB — IRON AND TIBC
Iron: 18 ug/dL — ABNORMAL LOW (ref 45–182)
Saturation Ratios: 10 % — ABNORMAL LOW (ref 17.9–39.5)
TIBC: 185 ug/dL — ABNORMAL LOW (ref 250–450)
UIBC: 167 ug/dL

## 2020-11-27 LAB — BASIC METABOLIC PANEL
Anion gap: 6 (ref 5–15)
BUN: 76 mg/dL — ABNORMAL HIGH (ref 8–23)
CO2: 21 mmol/L — ABNORMAL LOW (ref 22–32)
Calcium: 8.5 mg/dL — ABNORMAL LOW (ref 8.9–10.3)
Chloride: 115 mmol/L — ABNORMAL HIGH (ref 98–111)
Creatinine, Ser: 4.26 mg/dL — ABNORMAL HIGH (ref 0.61–1.24)
GFR, Estimated: 12 mL/min — ABNORMAL LOW (ref >60)
Glucose, Bld: 104 mg/dL — ABNORMAL HIGH (ref 70–99)
Potassium: 4.6 mmol/L (ref 3.5–5.1)
Sodium: 142 mmol/L (ref 135–145)

## 2020-11-27 LAB — PROTIME-INR
INR: 1.5 — ABNORMAL HIGH (ref 0.8–1.2)
Prothrombin Time: 18.3 seconds — ABNORMAL HIGH (ref 11.4–15.2)

## 2020-11-27 LAB — VITAMIN B12: Vitamin B-12: 5939 pg/mL — ABNORMAL HIGH (ref 180–914)

## 2020-11-27 LAB — FOLATE: Folate: 15 ng/mL (ref >5.9)

## 2020-11-27 LAB — VITAMIN D 25 HYDROXY (VIT D DEFICIENCY, FRACTURES): Vit D, 25-Hydroxy: 36.66 ng/mL (ref 30–100)

## 2020-11-27 LAB — APTT: aPTT: 44 seconds — ABNORMAL HIGH (ref 24–36)

## 2020-11-27 MED ORDER — WARFARIN - PHARMACIST DOSING INPATIENT: Freq: Every day | Status: DC

## 2020-11-27 MED ORDER — HEPARIN BOLUS VIA INFUSION
4000.0000 [IU] | Freq: Once | INTRAVENOUS | Status: AC
  Administered 2020-11-27: 4000 [IU] via INTRAVENOUS
  Filled 2020-11-27: qty 4000

## 2020-11-27 MED ORDER — HEPARIN (PORCINE) 25000 UT/250ML-% IV SOLN
1100.0000 [IU]/h | INTRAVENOUS | Status: DC
  Administered 2020-11-27 – 2020-11-28 (×2): 1100 [IU]/h via INTRAVENOUS
  Filled 2020-11-27 (×2): qty 250

## 2020-11-27 MED ORDER — WARFARIN SODIUM 1 MG PO TABS
1.0000 mg | ORAL_TABLET | Freq: Once | ORAL | Status: AC
  Administered 2020-11-27: 1 mg via ORAL
  Filled 2020-11-27: qty 1

## 2020-11-27 NOTE — Progress Notes (Signed)
PROGRESS NOTE    [name] [name]  TFT:[id number] DOB: 04/22/1922 DOA: 11/21/2020 PCP: Owens Loffler, MD    Brief Narrative:  87 y.o. male with medical history significant of essential hypertension, chronic kidney disease stage IV, symptomatic bradycardia with pacemaker in place, hypertension lipidemia, prostate cancer, paroxysmal atrial fibrillation, diverticular disease GERD and multiple other medical problems who is currently on Coumadin.  Patient presented the ER secondary to having abnormal labs.  His daughter brought him in and mentioned that he has been gradually declining over the last couple of weeks.  Patient has had multiple falls in the last 1 month.  He had small rib fracture with pneumothorax.  Was on the service cardiovascular surgery at the time.  Patient continue to follow with PCP.  He was found to have worsening renal function although he had chronic kidney disease stage III at the time.  His intake has been declining.  He is also not been himself.  He did mention that he had dysuria with some abdominal flank.  No hematuria.  No diarrhea no vomiting.  Patient now has worsening renal failure with creatinine more than 5.  Admitted to the medicine service with nephrology and urology aware.  AKI likely multifactorial.  Per urology obstructive uropathy and associated AKI is unlikely due to lack of hydronephrosis on imaging.  Patient's creatinine is improving while on IV fluids.    Plan for the OR 8/30 with urology for cystoscopy and balloon dilatation.  Patient will likely have Foley catheter in place postoperatively.   Assessment & Plan:   Principal Problem:   AKI (acute kidney injury) ([place]) Active Problems:   Hypothyroidism   Hyperlipidemia   Essential hypertension   GERD   Prostate cancer, in remission   PACEMAKER, PERMANENT   Long term (current) use of anticoagulants   Longstanding persistent atrial fibrillation ([name])   Chronic kidney disease (CKD), stage IV (severe)  ([name])   Protein-calorie malnutrition, severe  AKI on CKD stage IIIb Metabolic acidosis, improved Multifactorial, prerenal versus obstructive uropathy Poor p.o. intake, recent hospitalizations Plan: Continue normal saline 75 cc/h Daily BMP Continue Flomax Appreciate nephrology input 8/30 s/p OR with urology Resumed Coumadin Possible disposition in 24 to 48 hours if kidney function  Paroxysmal atrial fibrillation Currently controlled on amiodarone Was therapeutic on warfarin Coumadin  held in anticipation of surgical procedure, resumed on 8/31 8/28: INR 2.0 8/29: INR 1.6 8/31: INR 1.5  Left arm swelling due to DVT Venous duplex: Nonocclusive, subacute appearing thrombus limited the left axillary vein. Pharmacy consulted to resume Coumadin and bridge with Lovenox versus heparin   Possible UTI Leukocytes on UA No other clinical indications of infection Suspect asymptomatic bacteriuria No indication for antibiotics at this time  Essential hypertension Lasix on hold As needed hydralazine  GERD PPI  Hyperlipidemia Pravastatin  Hypothyroidism Synthroid   DVT prophylaxis: SCDs Code Status: Full Family Communication: Daughter at bedside 8/26, 8/27, 8/29, 8/30 Disposition Plan: Status is: Inpatient  Remains inpatient appropriate because:Inpatient level of care appropriate due to severity of illness  Dispo: The patient is from: Home              Anticipated d/c is to: SNF              Patient currently is not medically stable to d/c.   Difficult to place patient No  OR with urology on 8/30     Level of care: Med-Surg  Consultants:  Nephrology Urology  Procedures: 8/30 cystoscopy with renal dilation,  balloon dilation of urethral stricture   Antimicrobials:  None   Subjective: Overnight patient was very delirious after the procedure, possibly due to anesthesia.  In the morning time patient was also agitated, having visual hallucinations. During my  exam patient was sleepy, patient's daughter was at bedside so patient was left resting comfortably.   Objective: Vitals:   11/26/20 2027 11/27/20 0446 11/27/20 0500 11/27/20 1029  BP: 124/68 134/73  115/63  Pulse: 61 64  65  Resp: 16 20  16   Temp: 98.7 F (37.1 C) (!) 97.4 F (36.3 C)  97.9 F (36.6 C)  TempSrc:  Oral  Oral  SpO2: 96% 95%  100%  Weight:   69.4 kg   Height:        Intake/Output Summary (Last 24 hours) at 11/27/2020 1558 Last data filed at 11/27/2020 0453 Gross per 24 hour  Intake 240 ml  Output 480 ml  Net -240 ml   Filed Weights   11/25/20 0416 11/26/20 1409 11/27/20 0500  Weight: 61.1 kg 61.1 kg 69.4 kg    Examination:  General exam: Lying in bed.  No distress Respiratory system: Lungs clear.  Normal work of breathing.  Room air Cardiovascular system: S1-S2, RRR, 2/6 murmur, no pedal edema  gastrointestinal system: Soft, NT/ND, normal bowel sound Central nervous system: No focal neurological deficits. Sleepy  Extremities: Symmetric 5 x 5 power. Skin: No rashes, lesions or ulcers Psychiatry: sleepy    Data Reviewed: I have personally reviewed following labs and imaging studies  CBC: Recent Labs  Lab 11/23/20 0744 11/24/20 0808 11/25/20 0404 11/26/20 0423 11/27/20 0709  WBC 6.9 6.2 6.4 7.3 9.3  NEUTROABS 4.3 3.6 3.7 4.5 7.7  HGB 11.0* 10.5* 10.1* 10.5* 10.2*  HCT 33.1* 31.9* 31.9* 34.1* 34.0*  MCV 97.6 97.9 99.7 104.0* 105.6*  PLT 211 205 201 196 941   Basic Metabolic Panel: Recent Labs  Lab 11/22/20 0444 11/23/20 0744 11/24/20 0808 11/25/20 0404 11/26/20 0423 11/27/20 0709  NA 137 139 140 140 142 142  K 4.1 3.4* 3.6 3.4* 4.3 4.6  CL 108 102 106 111 112* 115*  CO2 20* 26 25 23  21* 21*  GLUCOSE 138* 102* 88 105* 95 104*  BUN 89* 83* 85* 80* 75* 76*  CREATININE 4.77* 4.60* 4.55* 4.66* 4.45* 4.26*  CALCIUM 8.1* 8.3* 8.4* 8.1* 8.5* 8.5*  PHOS 6.8*  --   --   --   --   --    GFR: Estimated Creatinine Clearance: 10 mL/min (A) (by  C-G formula based on SCr of 4.26 mg/dL (H)). Liver Function Tests: Recent Labs  Lab 11/21/20 1427 11/22/20 0444  AST 18 17  ALT 16 15  ALKPHOS 99 93  BILITOT 0.8 0.8  PROT 7.0 6.3*  ALBUMIN 3.3* 2.9*   No results for input(s): LIPASE, AMYLASE in the last 168 hours. No results for input(s): AMMONIA in the last 168 hours. Coagulation Profile: Recent Labs  Lab 11/23/20 0950 11/24/20 0432 11/25/20 0404 11/26/20 0423 11/27/20 0709  INR 3.0* 2.0* 1.6* 1.4* 1.5*   Cardiac Enzymes: No results for input(s): CKTOTAL, CKMB, CKMBINDEX, TROPONINI in the last 168 hours. BNP (last 3 results) No results for input(s): PROBNP in the last 8760 hours. HbA1C: No results for input(s): HGBA1C in the last 72 hours. CBG: No results for input(s): GLUCAP in the last 168 hours. Lipid Profile: No results for input(s): CHOL, HDL, LDLCALC, TRIG, CHOLHDL, LDLDIRECT in the last 72 hours. Thyroid Function Tests: No results for input(s): TSH,  T4TOTAL, FREET4, T3FREE, THYROIDAB in the last 72 hours. Anemia Panel: Recent Labs    11/27/20 0709  VITAMINB12 5,939*  FOLATE 15.0  TIBC 185*  IRON 18*   Sepsis Labs: No results for input(s): PROCALCITON, LATICACIDVEN in the last 168 hours.  Recent Results (from the past 240 hour(s))  Resp Panel by RT-PCR (Flu A&B, Covid) Nasopharyngeal Swab     Status: None   Collection Time: 11/21/20  3:59 PM   Specimen: Nasopharyngeal Swab; Nasopharyngeal(NP) swabs in vial transport medium  Result Value Ref Range Status   SARS Coronavirus 2 by RT PCR NEGATIVE NEGATIVE Final    Comment: (NOTE) SARS-CoV-2 target nucleic acids are NOT DETECTED.  The SARS-CoV-2 RNA is generally detectable in upper respiratory specimens during the acute phase of infection. The lowest concentration of SARS-CoV-2 viral copies this assay can detect is 138 copies/mL. A negative result does not preclude SARS-Cov-2 infection and should not be used as the sole basis for treatment or other  patient management decisions. A negative result may occur with  improper specimen collection/handling, submission of specimen other than nasopharyngeal swab, presence of viral mutation(s) within the areas targeted by this assay, and inadequate number of viral copies(<138 copies/mL). A negative result must be combined with clinical observations, patient history, and epidemiological information. The expected result is Negative.  Fact Sheet for Patients:  EntrepreneurPulse.com.au  Fact Sheet for Healthcare Providers:  IncredibleEmployment.be  This test is no t yet approved or cleared by the [place] FDA and  has been authorized for detection and/or diagnosis of SARS-CoV-2 by FDA under an Emergency Use Authorization (EUA). This EUA will remain  in effect (meaning this test can be used) for the duration of the COVID-19 declaration under Section 564(b)(1) of the Act, 21 U.S.C.section 360bbb-3(b)(1), unless the authorization is terminated  or revoked sooner.       Influenza A by PCR NEGATIVE NEGATIVE Final   Influenza B by PCR NEGATIVE NEGATIVE Final    Comment: (NOTE) The Xpert Xpress SARS-CoV-2/FLU/RSV plus assay is intended as an aid in the diagnosis of influenza from Nasopharyngeal swab specimens and should not be used as a sole basis for treatment. Nasal washings and aspirates are unacceptable for Xpert Xpress SARS-CoV-2/FLU/RSV testing.  Fact Sheet for Patients: EntrepreneurPulse.com.au  Fact Sheet for Healthcare Providers: IncredibleEmployment.be  This test is not yet approved or cleared by the [place] FDA and has been authorized for detection and/or diagnosis of SARS-CoV-2 by FDA under an Emergency Use Authorization (EUA). This EUA will remain in effect (meaning this test can be used) for the duration of the COVID-19 declaration under Section 564(b)(1) of the Act, 21 U.S.C. section  360bbb-3(b)(1), unless the authorization is terminated or revoked.  Performed at [facility], [place], [place] [address]   C Difficile Quick Screen w PCR reflex     Status: None   Collection Time: 11/23/20  4:45 PM   Specimen: STOOL  Result Value Ref Range Status   C Diff antigen NEGATIVE NEGATIVE Final   C Diff toxin NEGATIVE NEGATIVE Final   C Diff interpretation No C. difficile detected.  Final    Comment: Performed at [facility], [address] [place], [place] [address]  Gastrointestinal Panel by PCR , Stool     Status: None   Collection Time: 11/23/20  4:45 PM   Specimen: Stool  Result Value Ref Range Status   Campylobacter species NOT DETECTED NOT DETECTED Final   Plesimonas shigelloides NOT DETECTED  NOT DETECTED Final   Salmonella species NOT DETECTED NOT DETECTED Final   Yersinia enterocolitica NOT DETECTED NOT DETECTED Final   Vibrio species NOT DETECTED NOT DETECTED Final   Vibrio cholerae NOT DETECTED NOT DETECTED Final   Enteroaggregative E coli (EAEC) NOT DETECTED NOT DETECTED Final   Enteropathogenic E coli (EPEC) NOT DETECTED NOT DETECTED Final   Enterotoxigenic E coli (ETEC) NOT DETECTED NOT DETECTED Final   Shiga like toxin producing E coli (STEC) NOT DETECTED NOT DETECTED Final   Shigella/Enteroinvasive E coli (EIEC) NOT DETECTED NOT DETECTED Final   Cryptosporidium NOT DETECTED NOT DETECTED Final   Cyclospora cayetanensis NOT DETECTED NOT DETECTED Final   Entamoeba histolytica NOT DETECTED NOT DETECTED Final   Giardia lamblia NOT DETECTED NOT DETECTED Final   Adenovirus F40/41 NOT DETECTED NOT DETECTED Final   Astrovirus NOT DETECTED NOT DETECTED Final   Norovirus GI/GII NOT DETECTED NOT DETECTED Final   Rotavirus A NOT DETECTED NOT DETECTED Final   Sapovirus (I, II, IV, and V) NOT DETECTED NOT DETECTED Final    Comment: Performed at [facility], [address] [place], [place] [address]          Radiology Studies: US Venous Img Upper Uni Left (DVT)  Result Date: 11/27/2020 CLINICAL DATA:  87 year old male with left arm swelling and redness. EXAM: UPPER EXTREMITY VENOUS DOPPLER ULTRASOUND TECHNIQUE: Gray-scale sonography with graded compression, as well as color Doppler and duplex ultrasound were performed to evaluate the upper extremity deep venous system from the level of the subclavian vein and including the jugular, axillary, basilic, radial, ulnar and upper cephalic vein. Spectral Doppler was utilized to evaluate flow at rest and with distal augmentation maneuvers. COMPARISON:  None. FINDINGS: Contralateral Subclavian Vein: Respiratory phasicity is normal and symmetric with the symptomatic side. No evidence of thrombus. Normal compressibility. Internal Jugular Vein: No evidence of thrombus. Normal compressibility, respiratory phasicity and response to augmentation. Subclavian Vein: No evidence of thrombus. Normal compressibility, respiratory phasicity and response to augmentation. Axillary Vein: Nonocclusive, subacute appearing thrombus is present. Cephalic Vein: No evidence of thrombus. Normal compressibility, respiratory phasicity and response to augmentation. Basilic Vein: No evidence of thrombus. Normal compressibility, respiratory phasicity and response to augmentation. Brachial Veins: No evidence of thrombus. Normal compressibility, respiratory phasicity and response to augmentation. Radial Veins: No evidence of thrombus. Normal compressibility, respiratory phasicity and response to augmentation. Ulnar Veins: No evidence of thrombus. Normal compressibility, respiratory phasicity and response to augmentation. Other Findings: Subcutaneous edema is noted in the right upper extremity. IMPRESSION: Nonocclusive, subacute appearing thrombus limited the left axillary vein. Ruthann Cancer, MD [facility] Electronically Signed   By:  Ruthann Cancer M.D.   On: 11/27/2020 12:23        Scheduled Meds:  amiodarone  100 mg Oral QPM   Chlorhexidine Gluconate Cloth  6 each Topical Daily   cyanocobalamin  1,000 mcg Intramuscular Q30 days   dorzolamide-timolol  1 drop Both Eyes BID   feeding supplement  237 mL Oral TID BM   levothyroxine  100 mcg Oral QAC breakfast   mirtazapine  15 mg Oral QHS   multivitamin with minerals  1 tablet Oral Daily   pantoprazole  40 mg Oral Daily   pravastatin  20 mg Oral q1800   simethicone  80 mg Oral QID   tamsulosin  0.4 mg Oral Daily   warfarin  1 mg Oral ONCE-1600   Warfarin - Pharmacist Dosing Inpatient   Does not  apply q1600   Continuous Infusions:  sodium chloride 75 mL/hr at 11/27/20 0733     LOS: 6 days    Time spent: 25 minutes    Val Riles, MD [facility] Pager [phone]  If 7PM-7AM, please contact night-coverage 11/27/2020, 3:58 PM

## 2020-11-27 NOTE — Consult Note (Signed)
[place] for Heparin Indication:  Nonocclusive, subacute appearing thrombus limited the left axillary.   Allergies  Allergen Reactions   Penicillins Rash and Other (See Comments)    Has patient had a PCN reaction causing immediate rash, facial/tongue/throat swelling, SOB or lightheadedness with hypotension: {no Has patient had a PCN reaction causing severe rash involving mucus membranes or skin necrosis: {no Has patient had a PCN reaction that required hospitalization {no Has patient had a PCN reaction occurring within the last 10 years: {no If all of the above answers are "NO", then may proceed with Cephalosporin use.    Patient Measurements: Height: 5\' 8"  (172.7 cm) Weight: 69.4 kg (153 lb) IBW/kg (Calculated) : 68.4 Heparin Dosing Weight: 69.4 kg  Vital Signs: Temp: 97.9 F (36.6 C) (08/31 1029) Temp Source: Oral (08/31 1029) BP: 115/63 (08/31 1029) Pulse Rate: 65 (08/31 1029)  Labs: Recent Labs    11/25/20 0404 11/26/20 0423 11/27/20 0709  HGB 10.1* 10.5* 10.2*  HCT 31.9* 34.1* 34.0*  PLT 201 196 201  LABPROT 18.6* 17.4* 18.3*  INR 1.6* 1.4* 1.5*  CREATININE 4.66* 4.45* 4.26*    Estimated Creatinine Clearance: 10 mL/min (A) (by C-G formula based on SCr of 4.26 mg/dL (H)).   Medical History: Past Medical History:  Diagnosis Date   Anemia    Aortic atherosclerosis ([name])    Balance problems    uses walker   Cardiac pacemaker in situ 07/2005   symptomatic bradycardia   CKD (chronic kidney disease) stage 3, GFR 30-59 ml/min ([name]) 04/13/2017   Closed right hip fracture, initial encounter ([place]) 02/14/2019   Diverticulosis of colon (without mention of hemorrhage)    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    GERD (gastroesophageal reflux disease)    Glaucoma    Hyperlipidemia    Hypertension    Neck arthritis, Severe, multi-level 06/09/2013   Paroxysmal atrial fibrillation ([name])    s/p failed ablation   Prostate  cancer ([place]) 12/2000   5 weeks XRT + brachytherapy   Radiation proctitis    Sinoatrial node dysfunction ([name])    Skin cancer    Skin cancer of eyelid, left 10/2007   Unspecified glaucoma(365.9)    Unspecified hypothyroidism    Vitamin B12 deficiency     Medications:  Medications Prior to Admission  Medication Sig Dispense Refill Last Dose   alendronate (FOSAMAX) 70 MG tablet TAKE 1 TABLET BY MOUTH EVERY 7 DAYS WITH A FULL GLASS OF WATER AND ON AN EMPTY STOMACH (Patient taking differently: Take 70 mg by mouth every Thursday. TAKE 1 TABLET BY MOUTH EVERY 7 DAYS WITH A FULL GLASS OF WATER AND ON AN EMPTY STOMACH) 12 tablet 3 11/14/2020 at unk   amiodarone (PACERONE) 200 MG tablet TAKE 1/2 TABLET(100 MG) BY MOUTH DAILY (Patient taking differently: Take 100 mg by mouth every evening.) 45 tablet 3 11/20/2020 at 2100   cyanocobalamin (,VITAMIN B-12,) 1000 MCG/ML injection Inject 1,000 mcg into the muscle every 30 (thirty) days.   Past Month at unk   docusate sodium (COLACE) 50 MG capsule Take 50 mg by mouth 2 (two) times daily as needed for mild constipation.   prn at prn   dorzolamide-timolol (COSOPT) 22.3-6.8 MG/ML ophthalmic solution Place 1 drop into both eyes 2 (two) times daily.   11/21/2020 at 1130   levothyroxine (SYNTHROID) 100 MCG tablet TAKE 1 TABLET BY MOUTH DAILY BEFORE BREAKFAST 90 tablet 3 11/20/2020 at am   loperamide (IMODIUM) 2 MG capsule  Take 2 mg by mouth as needed for diarrhea or loose stools.   prn at prn   mirtazapine (REMERON) 15 MG tablet Take 1 tablet (15 mg total) by mouth at bedtime. 30 tablet 3 11/20/2020 at 2100   pantoprazole (PROTONIX) 40 MG tablet TAKE 1 TABLET(40 MG) BY MOUTH DAILY 90 tablet 3 11/21/2020 at 1130   polyethylene glycol (MIRALAX / GLYCOLAX) packet Take 17 g by mouth daily as needed for moderate constipation.    prn at prn   pravastatin (PRAVACHOL) 40 MG tablet TAKE 1/2 TABLET(20 MG) BY MOUTH AT BEDTIME 45 tablet 3 11/20/2020 at 2100   simethicone (MYLICON)  147 MG chewable tablet Chew 125 mg by mouth every 6 (six) hours as needed for flatulence.   prn at prn   tamsulosin (FLOMAX) 0.4 MG CAPS capsule TAKE 1 CAPSULE BY MOUTH DAILY 90 capsule 3 11/20/2020 at am   traMADol (ULTRAM) 50 MG tablet Take 1 tablet (50 mg total) by mouth every 6 (six) hours as needed for moderate pain. 120 tablet 1 11/21/2020 at am   warfarin (COUMADIN) 1 MG tablet TAKE 1 TO 1 AND 1/2 TABLETS BY MOUTH DAILY AS DIRECTED BY COUMADIN CLINIC (Patient taking differently: Take 0.5-1 mg by mouth at bedtime. TAKE 1 TO 1 AND 1/2 TABLETS BY MOUTH DAILY AS DIRECTED BY COUMADIN CLINIC  0.5 mg everyday except for fri he gets 1 mg) 45 tablet 2 11/20/2020 at 2100   Wheat Dextrin (BENEFIBER DRINK MIX PO) Take 1 scoop by mouth daily.   prn at prn   acetaminophen (TYLENOL) 500 MG tablet Take 500 mg by mouth every 6 (six) hours as needed for moderate pain or headache. (Patient not taking: Reported on 11/21/2020)   Not Taking   furosemide (LASIX) 20 MG tablet Take 1 tablet (20 mg total) by mouth daily. (Patient not taking: Reported on 11/21/2020) 90 tablet 1 Not Taking   Scheduled:   amiodarone  100 mg Oral QPM   Chlorhexidine Gluconate Cloth  6 each Topical Daily   cyanocobalamin  1,000 mcg Intramuscular Q30 days   dorzolamide-timolol  1 drop Both Eyes BID   feeding supplement  237 mL Oral TID BM   levothyroxine  100 mcg Oral QAC breakfast   mirtazapine  15 mg Oral QHS   multivitamin with minerals  1 tablet Oral Daily   pantoprazole  40 mg Oral Daily   pravastatin  20 mg Oral q1800   simethicone  80 mg Oral QID   tamsulosin  0.4 mg Oral Daily   warfarin  1 mg Oral ONCE-1600   Warfarin - Pharmacist Dosing Inpatient   Does not apply q1600   Infusions:   sodium chloride 75 mL/hr at 11/27/20 0733   PRN: acetaminophen **OR** acetaminophen, calcium carbonate, camphor-menthol **AND** hydrOXYzine, cholestyramine light, docusate sodium, docusate sodium, hydrALAZINE, loperamide, ondansetron **OR**  ondansetron (ZOFRAN) IV, polyethylene glycol, sorbitol, traMADol, zolpidem Anti-infectives (From admission, onward)    Start     Dose/Rate Route Frequency Ordered Stop   11/26/20 1700  cefTRIAXone (ROCEPHIN) 1 g in sodium chloride 0.9 % 100 mL IVPB        1 g 200 mL/hr over 30 Minutes Intravenous On call 11/25/20 1713 11/26/20 1516       Assessment: Pt found to have nonocclusive, subacute appearing thrombus limited the left axillary. Pharmacy consulted to start heparin infusion. Pt has a hx of afib and is currently on warfarin. INR is 1.5 (subtherapeutic). Plan to give warfarin 1mg  on 8/31. Pt  will need to bridge with warfarin x 5 days and until INR therapeutic for > 24 hours.   Goal of Therapy:  INR 2-3 Heparin level 0.3-0.7 units/ml Monitor platelets by anticoagulation protocol: Yes   Plan:  Give 4000 units bolus x 1 Start heparin infusion at 1100 units/hr Check anti-Xa level in 8 hours and daily while on heparin Continue to monitor H&H and platelets  Oswald Hillock, PharmD, BCPS 11/27/2020,4:07 PM

## 2020-11-27 NOTE — TOC Progression Note (Signed)
Transition of Care [facility]) - Progression Note    Patient Details  Name: [name] [name] MRN: [id number] Date of Birth: Dec 19, 1922  Transition of [facility]) CM/SW [place], LCSW Phone Number: 11/27/2020, 10:43 AM  Clinical Narrative:  Insurance authorization approved. Per urology PA, patient now with mittens due to delirium last night. Mittens will have to be off for 24 hours prior to discharge to [facility].  Expected Discharge Plan: Skilled Nursing Facility Barriers to Discharge: Continued Medical Work up  Expected Discharge Plan and Services Expected Discharge Plan: [place] arrangements for the past 2 months: Single Family Home                                       Social Determinants of Health (SDOH) Interventions    Readmission Risk Interventions Readmission Risk Prevention Plan 11/23/2020  Transportation Screening Complete  PCP or Specialist Appt within 3-5 Days Complete  HRI or Home Care Consult Complete  Social Work Consult for [place] Planning/Counseling Complete  Palliative Care Screening Not Applicable  Medication Review Press photographer) Complete  Some recent data might be hidden

## 2020-11-27 NOTE — Consult Note (Signed)
ANTICOAGULATION CONSULT NOTE - Initial Consult  Pharmacy Consult for warfarin dosing  Indication: atrial fibrillation  Allergies  Allergen Reactions   Penicillins Rash and Other (See Comments)    Has patient had a PCN reaction causing immediate rash, facial/tongue/throat swelling, SOB or lightheadedness with hypotension: {no Has patient had a PCN reaction causing severe rash involving mucus membranes or skin necrosis: {no Has patient had a PCN reaction that required hospitalization {no Has patient had a PCN reaction occurring within the last 10 years: {no If all of the above answers are "NO", then may proceed with Cephalosporin use.    Patient Measurements: Height: 5\' 8"  (172.7 cm) Weight: 69.4 kg (153 lb) IBW/kg (Calculated) : 68.4  Vital Signs: Temp: 97.4 F (36.3 C) (08/31 0446) Temp Source: Oral (08/31 0446) BP: 134/73 (08/31 0446) Pulse Rate: 64 (08/31 0446)  Labs: Recent Labs    11/25/20 0404 11/26/20 0423 11/27/20 0709  HGB 10.1* 10.5* 10.2*  HCT 31.9* 34.1* 34.0*  PLT 201 196 201  LABPROT 18.6* 17.4* 18.3*  INR 1.6* 1.4* 1.5*  CREATININE 4.66* 4.45* 4.26*    Estimated Creatinine Clearance: 10 mL/min (A) (by C-G formula based on SCr of 4.26 mg/dL (H)).   Medical History: Past Medical History:  Diagnosis Date   Anemia    Aortic atherosclerosis ([name])    Balance problems    uses walker   Cardiac pacemaker in situ 07/2005   symptomatic bradycardia   CKD (chronic kidney disease) stage 3, GFR 30-59 ml/min ([name]) 04/13/2017   Closed right hip fracture, initial encounter ([place]) 02/14/2019   Diverticulosis of colon (without mention of hemorrhage)    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    GERD (gastroesophageal reflux disease)    Glaucoma    Hyperlipidemia    Hypertension    Neck arthritis, Severe, multi-level 06/09/2013   Paroxysmal atrial fibrillation ([name])    s/p failed ablation   Prostate cancer ([place]) 12/2000   5 weeks XRT + brachytherapy    Radiation proctitis    Sinoatrial node dysfunction ([name])    Skin cancer    Skin cancer of eyelid, left 10/2007   Unspecified glaucoma(365.9)    Unspecified hypothyroidism    Vitamin B12 deficiency     Medications:  Scheduled:   amiodarone  100 mg Oral QPM   Chlorhexidine Gluconate Cloth  6 each Topical Daily   cyanocobalamin  1,000 mcg Intramuscular Q30 days   dorzolamide-timolol  1 drop Both Eyes BID   feeding supplement  237 mL Oral TID BM   levothyroxine  100 mcg Oral QAC breakfast   mirtazapine  15 mg Oral QHS   multivitamin with minerals  1 tablet Oral Daily   pantoprazole  40 mg Oral Daily   pravastatin  20 mg Oral q1800   simethicone  80 mg Oral QID   tamsulosin  0.4 mg Oral Daily   warfarin  1 mg Oral ONCE-1600   Warfarin - Pharmacist Dosing Inpatient   Does not apply q1600    Assessment: 87yo M with PMH of CKD3, anemia, Afib on warfarin, cardiac pacemaker 2007, HLD, HTN, prostate cancer 2002, glaucoma, hypothyroid presented to the hospital with complaints of weakness and worsening renal function. Pharmacy has been consulted for warfarin dosing.   On admission on 8/25 pt INR was therapeutic at 2.8 on his home regimen (0.5mg  Sat-Thurs and 1mg  Fri, ~0.57mg  daily). This regimen was confirmed with pt son. Warfarin was not restarting and over the following days the INR has continued  to decrease down to 1.5 on 8/31.   Upon review of the pt chart, a significant drug interaction with amiodarone and warfarin was found, however both medications were taken PTA and no signs of toxicity were seen.   Given subtherapeutic INR, the dose given today will be ~47% increase in dose of what pt usually receives at home.   Date INR 8/25 2.8 8/26 3.0 8/27 3.0 8/28 2.0 8/29 1.6 8/30 1.4 8/31 1.5  Goal of Therapy:  INR 2-3 Monitor platelets by anticoagulation protocol: Yes   Plan:  -Warfarin   -Will give Warfarin 1mg  x1 -Continue to monitor daily INR and adjust dose as clinically  indicated   Narda Rutherford, PharmD Pharmacy Resident  11/27/2020 2:11 PM

## 2020-11-27 NOTE — Telephone Encounter (Signed)
Please schedule PA follow-up 2-3 weeks   APPT SCHEDULED

## 2020-11-27 NOTE — Progress Notes (Signed)
PT Cancellation Note  Patient Details Name: [name] [name] MRN: [id number] DOB: 10-31-1922   Cancelled Treatment:    Reason Eval/Treat Not Completed: Medical issues which prohibited therapy: Hold PT services this date pending assessment/management of potential UE DVT.  Will attempt to see pt at a future date/time as medically appropriate.     Linus Salmons PT, DPT 11/27/20, 2:56 PM

## 2020-11-27 NOTE — Progress Notes (Signed)
Urology Consult Follow Up  Subjective: POD # 1 cystoscopy with renal dilation, balloon dilation of urethral stricture (Optilume DBC)  Intraoperative findings for bulbous urethral stricture ~ 1.5 cm and no gross tumor was identified although panendoscopy had suboptimal visualization due to debris and oozing in the bladder.  Patients with issues of delirium last evening and mitts are in place.  He is resting comfortably in bed.  Foley catheter is in place and draining clear yellow urine.  VSS afebrile  Serum creatinine 4.26.  WBC count 9.3.  Urine culture pending.   Anti-infectives: Anti-infectives (From admission, onward)    Start     Dose/Rate Route Frequency Ordered Stop   11/26/20 1700  cefTRIAXone (ROCEPHIN) 1 g in sodium chloride 0.9 % 100 mL IVPB        1 g 200 mL/hr over 30 Minutes Intravenous On call 11/25/20 1713 11/26/20 1516       Current Facility-Administered Medications  Medication Dose Route Frequency Provider Last Rate Last Admin   0.9 %  sodium chloride infusion   Intravenous Continuous Lyla Son, MD 75 mL/hr at 11/27/20 0733 New Bag at 11/27/20 1610   acetaminophen (TYLENOL) tablet 650 mg  650 mg Oral Q6H PRN Elwyn Reach, MD   650 mg at 11/27/20 9604   Or   acetaminophen (TYLENOL) suppository 650 mg  650 mg Rectal Q6H PRN Elwyn Reach, MD       amiodarone (PACERONE) tablet 100 mg  100 mg Oral QPM Elwyn Reach, MD   100 mg at 11/26/20 1937   calcium carbonate (TUMS - dosed in mg elemental calcium) chewable tablet 500 mg of elemental calcium  500 mg of elemental calcium Oral Q6H PRN Elwyn Reach, MD       camphor-menthol (SARNA) lotion 1 application  1 application Topical V4U PRN Elwyn Reach, MD       And   hydrOXYzine (ATARAX/VISTARIL) tablet 25 mg  25 mg Oral Q8H PRN Elwyn Reach, MD       Chlorhexidine Gluconate Cloth 2 % PADS 6 each  6 each Topical Daily Sreenath, Sudheer B, MD       cholestyramine light (PREVALITE) packet  4 g  4 g Oral BID PRN Priscella Mann, Sudheer B, MD       cyanocobalamin ((VITAMIN B-12)) injection 1,000 mcg  1,000 mcg Intramuscular Q30 days Gala Romney L, MD   1,000 mcg at 11/22/20 0930   docusate sodium (COLACE) capsule 50 mg  50 mg Oral BID PRN Elwyn Reach, MD       docusate sodium (ENEMEEZ) enema 283 mg  1 enema Rectal PRN Elwyn Reach, MD       dorzolamide-timolol (COSOPT) 22.3-6.8 MG/ML ophthalmic solution 1 drop  1 drop Both Eyes BID Gala Romney L, MD   1 drop at 11/26/20 2208   feeding supplement (ENSURE ENLIVE / ENSURE PLUS) liquid 237 mL  237 mL Oral TID BM Ralene Muskrat B, MD   237 mL at 11/24/20 0914   hydrALAZINE (APRESOLINE) injection 10 mg  10 mg Intravenous Q4H PRN Ralene Muskrat B, MD       levothyroxine (SYNTHROID) tablet 100 mcg  100 mcg Oral QAC breakfast Gala Romney L, MD   100 mcg at 11/27/20 0816   loperamide (IMODIUM) capsule 2 mg  2 mg Oral PRN Elwyn Reach, MD       mirtazapine (REMERON) tablet 15 mg  15 mg Oral QHS Elwyn Reach, MD  15 mg at 11/26/20 2208   multivitamin with minerals tablet 1 tablet  1 tablet Oral Daily Ralene Muskrat B, MD   1 tablet at 11/27/20 0815   ondansetron (ZOFRAN) tablet 4 mg  4 mg Oral Q6H PRN Elwyn Reach, MD       Or   ondansetron (ZOFRAN) injection 4 mg  4 mg Intravenous Q6H PRN Elwyn Reach, MD       pantoprazole (PROTONIX) EC tablet 40 mg  40 mg Oral Daily Gala Romney L, MD   40 mg at 11/27/20 0816   polyethylene glycol (MIRALAX / GLYCOLAX) packet 17 g  17 g Oral Daily PRN Elwyn Reach, MD       pravastatin (PRAVACHOL) tablet 20 mg  20 mg Oral q1800 Elwyn Reach, MD   20 mg at 11/26/20 1938   simethicone (MYLICON) chewable tablet 80 mg  80 mg Oral QID Gala Romney L, MD   80 mg at 11/27/20 0816   sorbitol 70 % solution 30 mL  30 mL Oral PRN Elwyn Reach, MD       tamsulosin (FLOMAX) capsule 0.4 mg  0.4 mg Oral Daily Gala Romney L, MD   0.4 mg at 11/27/20 0816    traMADol (ULTRAM) tablet 50 mg  50 mg Oral Q6H PRN Elwyn Reach, MD       zolpidem (AMBIEN) tablet 5 mg  5 mg Oral QHS PRN Elwyn Reach, MD   5 mg at 11/26/20 2325     Objective: Vital signs in last 24 hours: Temp:  [97.1 F (36.2 C)-98.7 F (37.1 C)] 97.4 F (36.3 C) (08/31 0446) Pulse Rate:  [59-76] 64 (08/31 0446) Resp:  [16-20] 20 (08/31 0446) BP: (90-160)/(64-79) 134/73 (08/31 0446) SpO2:  [92 %-98 %] 95 % (08/31 0446) Weight:  [61.1 kg-69.4 kg] 69.4 kg (08/31 0500)  Intake/Output from previous day: 08/30 0701 - 08/31 0700 In: 740 [P.O.:240; I.V.:500] Out: 480 [Urine:480] Intake/Output this shift: No intake/output data recorded.   Physical Exam Constitutional:  Well nourished. Alert and oriented, No acute distress. HEENT: [place] AT, moist mucus membranes.  Trachea midline   Cardiovascular: No clubbing, cyanosis, or edema. Respiratory: Normal respiratory effort, no increased work of breathing.  Nasal cannula in place GI: Abdomen is soft, non tender, non distended.    GU: No CVA tenderness.  No bladder fullness or masses.  Catheter in place draining clear yellow urine. Psychiatric: Normal mood and affect.   Lab Results:  Recent Labs    11/26/20 0423 11/27/20 0709  WBC 7.3 9.3  HGB 10.5* 10.2*  HCT 34.1* 34.0*  PLT 196 201   BMET Recent Labs    11/26/20 0423 11/27/20 0709  NA 142 142  K 4.3 4.6  CL 112* 115*  CO2 21* 21*  GLUCOSE 95 104*  BUN 75* 76*  CREATININE 4.45* 4.26*  CALCIUM 8.5* 8.5*   PT/INR Recent Labs    11/26/20 0423 11/27/20 0709  LABPROT 17.4* 18.3*  INR 1.4* 1.5*   ABG No results for input(s): PHART, HCO3 in the last 72 hours.  Invalid input(s): PCO2, PO2  Studies/Results: No results found.   Assessment and Plan: 87 year old male with recurrent bulbar urethral stricture with a history of mesh erosion from a previous hernia repair into the anterior bladder which was incompletely removed endoscopically and he declined  open treatment who underwent cystoscopy with urethral dilation and balloon dilation urethral stricture (Optilume DBC) on 11/27/2020 with Dr. Bernardo Heater.    -  Serum creatinine is elevated likely due to prerenal sources as he had no hydronephrosis on admitting renal ultrasound and Foley catheter is currently in place  -Recommend Foley catheter drainage for 7 days -2 to 3-week postop follow-up     LOS: 6 days    [facility] 11/27/2020

## 2020-11-27 NOTE — Progress Notes (Signed)
OT Cancellation Note  Patient Details Name: [name] [name] MRN: [id number] DOB: 1922-05-04   Cancelled Treatment:    Reason Eval/Treat Not Completed: Medical issues which prohibited therapy. Pt noted to be pending imaging to r/o DVT. Will hold at this time and re-attempt at a later date/time as available and pt medically appropriate for OT services.   Shara Blazing, M.S., OTR/L Ascom: [phone] 11/27/20, 11:55 AM

## 2020-11-27 NOTE — Progress Notes (Addendum)
[facility]  PROGRESS NOTE   Subjective:   Patient seen resting in bed Drowsy Daughter at bedside States patient has increased confusion and agitation, since surgery Not able to eat this morning   Objective:  Vital signs in last 24 hours:  Temp:  [97.1 F (36.2 C)-98.7 F (37.1 C)] 97.4 F (36.3 C) (08/31 0446) Pulse Rate:  [59-76] 64 (08/31 0446) Resp:  [16-20] 20 (08/31 0446) BP: (90-160)/(64-79) 134/73 (08/31 0446) SpO2:  [92 %-98 %] 95 % (08/31 0446) Weight:  [61.1 kg-69.4 kg] 69.4 kg (08/31 0500)  Weight change:  Filed Weights   11/25/20 0416 11/26/20 1409 11/27/20 0500  Weight: 61.1 kg 61.1 kg 69.4 kg    Intake/Output: I/O last 3 completed shifts: In: 740 [P.O.:240; I.V.:500] Out: 780 [Urine:780]   Intake/Output this shift:  No intake/output data recorded.  Physical Exam: General:  No acute distress  Head:  Normocephalic, atraumatic. Moist oral mucosal membranes  Eyes:  Anicteric  Lungs:   Clear to auscultation, normal effort  Heart:  S1S2 no rubs  Abdomen:   Soft, nontender, bowel sounds present  Extremities:  No BLE peripheral edema, LUE edema, erythema.  Neurologic:  Very lethargic  Skin:  No lesions       Basic Metabolic Panel: Recent Labs  Lab 11/22/20 0444 11/23/20 0744 11/24/20 0808 11/25/20 0404 11/26/20 0423 11/27/20 0709  NA 137 139 140 140 142 142  K 4.1 3.4* 3.6 3.4* 4.3 4.6  CL 108 102 106 111 112* 115*  CO2 20* 26 25 23  21* 21*  GLUCOSE 138* 102* 88 105* 95 104*  BUN 89* 83* 85* 80* 75* 76*  CREATININE 4.77* 4.60* 4.55* 4.66* 4.45* 4.26*  CALCIUM 8.1* 8.3* 8.4* 8.1* 8.5* 8.5*  PHOS 6.8*  --   --   --   --   --      CBC: Recent Labs  Lab 11/23/20 0744 11/24/20 0808 11/25/20 0404 11/26/20 0423 11/27/20 0709  WBC 6.9 6.2 6.4 7.3 9.3  NEUTROABS 4.3 3.6 3.7 4.5 7.7  HGB 11.0* 10.5* 10.1* 10.5* 10.2*  HCT 33.1* 31.9* 31.9* 34.1* 34.0*  MCV 97.6 97.9 99.7 104.0* 105.6*  PLT 211 205 201 196 201       Urinalysis: No results for input(s): COLORURINE, LABSPEC, PHURINE, GLUCOSEU, HGBUR, BILIRUBINUR, KETONESUR, PROTEINUR, UROBILINOGEN, NITRITE, LEUKOCYTESUR in the last 72 hours.  Invalid input(s): APPERANCEUR     Imaging: No results found.   Medications:    sodium chloride 75 mL/hr at 11/27/20 0102    amiodarone  100 mg Oral QPM   Chlorhexidine Gluconate Cloth  6 each Topical Daily   cyanocobalamin  1,000 mcg Intramuscular Q30 days   dorzolamide-timolol  1 drop Both Eyes BID   feeding supplement  237 mL Oral TID BM   levothyroxine  100 mcg Oral QAC breakfast   mirtazapine  15 mg Oral QHS   multivitamin with minerals  1 tablet Oral Daily   pantoprazole  40 mg Oral Daily   pravastatin  20 mg Oral q1800   simethicone  80 mg Oral QID   tamsulosin  0.4 mg Oral Daily   warfarin  1 mg Oral ONCE-1600   Warfarin - Pharmacist Dosing Inpatient   Does not apply q1600    Assessment/ Plan:     Principal Problem:   AKI (acute kidney injury) ([place]) Active Problems:   Hypothyroidism   Hyperlipidemia   Essential hypertension   GERD   Prostate cancer, in remission   PACEMAKER, PERMANENT  Long term (current) use of anticoagulants   Longstanding persistent atrial fibrillation ([name])   Chronic kidney disease (CKD), stage IV (severe) ([name])   Protein-calorie malnutrition, severe  #1 acute kidney injury on  chronic kidney disease st 4.  Most likely due to obstructive disease.  Baseline creatinine 2.71 from 10/16/20. GFR 20 Patient underwent cystoscopy with urethral dilatation on November 26, 2020 Creatinine has began to trend downward.. Will continue to monitor for continued downward trend. Foley catheter in place since surgery, recommended placement for 7 days. Lab Results  Component Value Date   CREATININE 4.26 (H) 11/27/2020   CREATININE 4.45 (H) 11/26/2020   CREATININE 4.66 (H) 11/25/2020      #2:  Mental status change Believed from procedure anesthesia  Continue IVF  until oral intake improved Due to decreased renal function, may take extra time to clear sedative medications.    #3: Hyperkalemia: Currently 4.3. normal saline at 75 cc an hour.  #4 left arm Edema Requested [place] r/o DVT Elevated on pillows  Will monitor   LOS: 6 Colon Flattery, NP [facility] 8/31/202210:00 AM

## 2020-11-28 LAB — PROTIME-INR
INR: 1.8 — ABNORMAL HIGH (ref 0.8–1.2)
Prothrombin Time: 20.8 seconds — ABNORMAL HIGH (ref 11.4–15.2)

## 2020-11-28 LAB — BASIC METABOLIC PANEL
Anion gap: 3 — ABNORMAL LOW (ref 5–15)
BUN: 73 mg/dL — ABNORMAL HIGH (ref 8–23)
CO2: 23 mmol/L (ref 22–32)
Calcium: 8.1 mg/dL — ABNORMAL LOW (ref 8.9–10.3)
Chloride: 115 mmol/L — ABNORMAL HIGH (ref 98–111)
Creatinine, Ser: 4.22 mg/dL — ABNORMAL HIGH (ref 0.61–1.24)
GFR, Estimated: 12 mL/min — ABNORMAL LOW (ref >60)
Glucose, Bld: 113 mg/dL — ABNORMAL HIGH (ref 70–99)
Potassium: 4.7 mmol/L (ref 3.5–5.1)
Sodium: 141 mmol/L (ref 135–145)

## 2020-11-28 LAB — CBC WITH DIFFERENTIAL/PLATELET
Abs Immature Granulocytes: 0.06 10*3/uL (ref 0.00–0.07)
Basophils Absolute: 0 10*3/uL (ref 0.0–0.1)
Basophils Relative: 0 %
Eosinophils Absolute: 0.3 10*3/uL (ref 0.0–0.5)
Eosinophils Relative: 3 %
HCT: 34.5 % — ABNORMAL LOW (ref 39.0–52.0)
Hemoglobin: 10.4 g/dL — ABNORMAL LOW (ref 13.0–17.0)
Immature Granulocytes: 1 %
Lymphocytes Relative: 10 %
Lymphs Abs: 1.1 10*3/uL (ref 0.7–4.0)
MCH: 32.3 pg (ref 26.0–34.0)
MCHC: 30.1 g/dL (ref 30.0–36.0)
MCV: 107.1 fL — ABNORMAL HIGH (ref 80.0–100.0)
Monocytes Absolute: 0.9 10*3/uL (ref 0.1–1.0)
Monocytes Relative: 8 %
Neutro Abs: 8.4 10*3/uL — ABNORMAL HIGH (ref 1.7–7.7)
Neutrophils Relative %: 78 %
Platelets: 203 10*3/uL (ref 150–400)
RBC: 3.22 MIL/uL — ABNORMAL LOW (ref 4.22–5.81)
RDW: 15.2 % (ref 11.5–15.5)
WBC: 10.7 10*3/uL — ABNORMAL HIGH (ref 4.0–10.5)
nRBC: 0 % (ref 0.0–0.2)

## 2020-11-28 LAB — HEPARIN LEVEL (UNFRACTIONATED)
Heparin Unfractionated: 0.32 IU/mL (ref 0.30–0.70)
Heparin Unfractionated: 0.36 IU/mL (ref 0.30–0.70)

## 2020-11-28 LAB — PHOSPHORUS: Phosphorus: 6 mg/dL — ABNORMAL HIGH (ref 2.5–4.6)

## 2020-11-28 LAB — MAGNESIUM: Magnesium: 2 mg/dL (ref 1.7–2.4)

## 2020-11-28 MED ORDER — WARFARIN SODIUM 1 MG PO TABS
1.0000 mg | ORAL_TABLET | Freq: Once | ORAL | Status: AC
  Administered 2020-11-28: 1 mg via ORAL
  Filled 2020-11-28: qty 1

## 2020-11-28 MED ORDER — ASCORBIC ACID 500 MG PO TABS
500.0000 mg | ORAL_TABLET | Freq: Every day | ORAL | Status: DC
  Administered 2020-11-28 – 2020-11-29 (×2): 500 mg via ORAL
  Filled 2020-11-28 (×2): qty 1

## 2020-11-28 MED ORDER — POLYSACCHARIDE IRON COMPLEX 150 MG PO CAPS
150.0000 mg | ORAL_CAPSULE | Freq: Every day | ORAL | Status: DC
  Administered 2020-11-28 – 2020-11-29 (×2): 150 mg via ORAL
  Filled 2020-11-28 (×2): qty 1

## 2020-11-28 NOTE — Progress Notes (Signed)
PROGRESS NOTE    [name] [name]  EXH:[id number] DOB: Sep 04, 1922 DOA: 11/21/2020 PCP: Owens Loffler, MD    Brief Narrative:  87 y.o. male with medical history significant of essential hypertension, chronic kidney disease stage IV, symptomatic bradycardia with pacemaker in place, hypertension lipidemia, prostate cancer, paroxysmal atrial fibrillation, diverticular disease GERD and multiple other medical problems who is currently on Coumadin.  Patient presented the ER secondary to having abnormal labs.  His daughter brought him in and mentioned that he has been gradually declining over the last couple of weeks.  Patient has had multiple falls in the last 1 month.  He had small rib fracture with pneumothorax.  Was on the service cardiovascular surgery at the time.  Patient continue to follow with PCP.  He was found to have worsening renal function although he had chronic kidney disease stage III at the time.  His intake has been declining.  He is also not been himself.  He did mention that he had dysuria with some abdominal flank.  No hematuria.  No diarrhea no vomiting.  Patient now has worsening renal failure with creatinine more than 5.  Admitted to the medicine service with nephrology and urology aware.  AKI likely multifactorial.  Per urology obstructive uropathy and associated AKI is unlikely due to lack of hydronephrosis on imaging.  Patient's creatinine is improving while on IV fluids.    Plan for the OR 8/30 with urology for cystoscopy and balloon dilatation.  Patient will likely have Foley catheter in place postoperatively.   Assessment & Plan:   Principal Problem:   AKI (acute kidney injury) ([place]) Active Problems:   Hypothyroidism   Hyperlipidemia   Essential hypertension   GERD   Prostate cancer, in remission   PACEMAKER, PERMANENT   Long term (current) use of anticoagulants   Longstanding persistent atrial fibrillation ([name])   Chronic kidney disease (CKD), stage IV (severe)  ([name])   Protein-calorie malnutrition, severe  AKI on CKD stage IIIb Metabolic acidosis, improved Multifactorial, prerenal versus obstructive uropathy Poor p.o. intake, recent hospitalizations Plan: Continue normal saline 75 cc/h Daily BMP Continue Flomax Appreciate nephrology input 8/30 s/p OR with urology Resumed Coumadin Possible disposition in 24 to 48 hours if kidney function Renal function gradually improving, creatinine 4.22 today   Toxic encephalopathy after cystoscopy, patient developed delirium and visual hallucinations. Encephalopathy resolved. Patient is AO x3 today, slightly confused or forgetful. Keep off mittens for 24 hours in order to discharge   Paroxysmal atrial fibrillation Currently controlled on amiodarone Was therapeutic on warfarin Coumadin  held in anticipation of surgical procedure, resumed on 8/31 8/28: INR 2.0 8/29: INR 1.6 8/31: INR 1.5--1.8 today   Left arm swelling due to DVT Venous duplex: Nonocclusive, subacute appearing thrombus limited the left axillary vein. Pharmacy consulted to resume Coumadin and bridge with heparin IV gtt   Possible UTI Leukocytes on UA No other clinical indications of infection Suspect asymptomatic bacteriuria No indication for antibiotics at this time  Essential hypertension Lasix on hold As needed hydralazine  GERD PPI  Hyperlipidemia Pravastatin  Hypothyroidism Synthroid  Iron deficiency, started oral iron supplement.  Vitamin B12 level is elevated, no need of supplements  Folate within normal range   DVT prophylaxis: SCDs Code Status: Full Family Communication: Daughter at bedside 8/26, 8/27, 8/29, 8/30 Disposition Plan: Status is: Inpatient  Remains inpatient appropriate because:Inpatient level of care appropriate due to severity of illness  Dispo: The patient is from: Home  Anticipated d/c is to: SNF              Patient currently is not medically stable to d/c. 1-2 days  depends on renal functions and nephrology clearance   Difficult to place patient No  Level of care: Med-Surg  Consultants:  Nephrology Urology  Procedures: 8/30 cystoscopy with renal dilation, balloon dilation of urethral stricture   Antimicrobials:  None   Subjective: No significant overnight events, patient was awake and alert today, AAO x3 slightly confused and forgetful.  Any active issues, no abdominal pain, no nausea vomiting or diarrhea, no chest pain or palpitation, no shortness of breath.   Objective: Vitals:   11/27/20 2027 11/28/20 0426 11/28/20 0500 11/28/20 0829  BP: (!) 109/57 121/66  95/60  Pulse: (!) 58 84  87  Resp: 17 16  16   Temp: 97.8 F (36.6 C) 97.7 F (36.5 C)  97.6 F (36.4 C)  TempSrc:    Oral  SpO2: 100% 100%  100%  Weight:   67.8 kg   Height:        Intake/Output Summary (Last 24 hours) at 11/28/2020 1201 Last data filed at 11/28/2020 1026 Gross per 24 hour  Intake 4338.91 ml  Output 620 ml  Net 3718.91 ml   Filed Weights   11/26/20 1409 11/27/20 0500 11/28/20 0500  Weight: 61.1 kg 69.4 kg 67.8 kg    Examination:  General exam: Lying in bed.  No distress Respiratory system: Lungs clear.  Normal work of breathing.  Room air Cardiovascular system: S1-S2, RRR, 2/6 murmur, no pedal edema  gastrointestinal system: Soft, NT/ND, normal bowel sound Central nervous system: No focal neurological deficits. Sleepy  Extremities: Symmetric 5 x 5 power. Skin: No rashes, lesions or ulcers Psychiatry: sleepy    Data Reviewed: I have personally reviewed following labs and imaging studies  CBC: Recent Labs  Lab 11/24/20 0808 11/25/20 0404 11/26/20 0423 11/27/20 0709 11/28/20 0459  WBC 6.2 6.4 7.3 9.3 10.7*  NEUTROABS 3.6 3.7 4.5 7.7 8.4*  HGB 10.5* 10.1* 10.5* 10.2* 10.4*  HCT 31.9* 31.9* 34.1* 34.0* 34.5*  MCV 97.9 99.7 104.0* 105.6* 107.1*  PLT 205 201 196 201 009   Basic Metabolic Panel: Recent Labs  Lab 11/22/20 0444  11/23/20 0744 11/24/20 0808 11/25/20 0404 11/26/20 0423 11/27/20 0709 11/28/20 0459  NA 137   < > 140 140 142 142 141  K 4.1   < > 3.6 3.4* 4.3 4.6 4.7  CL 108   < > 106 111 112* 115* 115*  CO2 20*   < > 25 23 21* 21* 23  GLUCOSE 138*   < > 88 105* 95 104* 113*  BUN 89*   < > 85* 80* 75* 76* 73*  CREATININE 4.77*   < > 4.55* 4.66* 4.45* 4.26* 4.22*  CALCIUM 8.1*   < > 8.4* 8.1* 8.5* 8.5* 8.1*  MG  --   --   --   --   --   --  2.0  PHOS 6.8*  --   --   --   --   --  6.0*   < > = values in this interval not displayed.   GFR: Estimated Creatinine Clearance: 10 mL/min (A) (by C-G formula based on SCr of 4.22 mg/dL (H)). Liver Function Tests: Recent Labs  Lab 11/21/20 1427 11/22/20 0444  AST 18 17  ALT 16 15  ALKPHOS 99 93  BILITOT 0.8 0.8  PROT 7.0 6.3*  ALBUMIN 3.3* 2.9*  No results for input(s): LIPASE, AMYLASE in the last 168 hours. No results for input(s): AMMONIA in the last 168 hours. Coagulation Profile: Recent Labs  Lab 11/24/20 0432 11/25/20 0404 11/26/20 0423 11/27/20 0709 11/28/20 0459  INR 2.0* 1.6* 1.4* 1.5* 1.8*   Cardiac Enzymes: No results for input(s): CKTOTAL, CKMB, CKMBINDEX, TROPONINI in the last 168 hours. BNP (last 3 results) No results for input(s): PROBNP in the last 8760 hours. HbA1C: No results for input(s): HGBA1C in the last 72 hours. CBG: No results for input(s): GLUCAP in the last 168 hours. Lipid Profile: No results for input(s): CHOL, HDL, LDLCALC, TRIG, CHOLHDL, LDLDIRECT in the last 72 hours. Thyroid Function Tests: No results for input(s): TSH, T4TOTAL, FREET4, T3FREE, THYROIDAB in the last 72 hours. Anemia Panel: Recent Labs    11/27/20 0709  VITAMINB12 5,939*  FOLATE 15.0  TIBC 185*  IRON 18*   Sepsis Labs: No results for input(s): PROCALCITON, LATICACIDVEN in the last 168 hours.  Recent Results (from the past 240 hour(s))  Resp Panel by RT-PCR (Flu A&B, Covid) Nasopharyngeal Swab     Status: None   Collection  Time: 11/21/20  3:59 PM   Specimen: Nasopharyngeal Swab; Nasopharyngeal(NP) swabs in vial transport medium  Result Value Ref Range Status   SARS Coronavirus 2 by RT PCR NEGATIVE NEGATIVE Final    Comment: (NOTE) SARS-CoV-2 target nucleic acids are NOT DETECTED.  The SARS-CoV-2 RNA is generally detectable in upper respiratory specimens during the acute phase of infection. The lowest concentration of SARS-CoV-2 viral copies this assay can detect is 138 copies/mL. A negative result does not preclude SARS-Cov-2 infection and should not be used as the sole basis for treatment or other patient management decisions. A negative result may occur with  improper specimen collection/handling, submission of specimen other than nasopharyngeal swab, presence of viral mutation(s) within the areas targeted by this assay, and inadequate number of viral copies(<138 copies/mL). A negative result must be combined with clinical observations, patient history, and epidemiological information. The expected result is Negative.  Fact Sheet for Patients:  EntrepreneurPulse.com.au  Fact Sheet for Healthcare Providers:  IncredibleEmployment.be  This test is no t yet approved or cleared by the [place] FDA and  has been authorized for detection and/or diagnosis of SARS-CoV-2 by FDA under an Emergency Use Authorization (EUA). This EUA will remain  in effect (meaning this test can be used) for the duration of the COVID-19 declaration under Section 564(b)(1) of the Act, 21 U.S.C.section 360bbb-3(b)(1), unless the authorization is terminated  or revoked sooner.       Influenza A by PCR NEGATIVE NEGATIVE Final   Influenza B by PCR NEGATIVE NEGATIVE Final    Comment: (NOTE) The Xpert Xpress SARS-CoV-2/FLU/RSV plus assay is intended as an aid in the diagnosis of influenza from Nasopharyngeal swab specimens and should not be used as a sole basis for treatment. Nasal washings  and aspirates are unacceptable for Xpert Xpress SARS-CoV-2/FLU/RSV testing.  Fact Sheet for Patients: EntrepreneurPulse.com.au  Fact Sheet for Healthcare Providers: IncredibleEmployment.be  This test is not yet approved or cleared by the [place] FDA and has been authorized for detection and/or diagnosis of SARS-CoV-2 by FDA under an Emergency Use Authorization (EUA). This EUA will remain in effect (meaning this test can be used) for the duration of the COVID-19 declaration under Section 564(b)(1) of the Act, 21 U.S.C. section 360bbb-3(b)(1), unless the authorization is terminated or revoked.  Performed at [facility], [address] [place], [place] [address]  C Difficile Quick Screen w PCR reflex     Status: None   Collection Time: 11/23/20  4:45 PM   Specimen: STOOL  Result Value Ref Range Status   C Diff antigen NEGATIVE NEGATIVE Final   C Diff toxin NEGATIVE NEGATIVE Final   C Diff interpretation No C. difficile detected.  Final    Comment: Performed at [facility], [address] [place], [place] [address]  Gastrointestinal Panel by PCR , Stool     Status: None   Collection Time: 11/23/20  4:45 PM   Specimen: Stool  Result Value Ref Range Status   Campylobacter species NOT DETECTED NOT DETECTED Final   Plesimonas shigelloides NOT DETECTED NOT DETECTED Final   Salmonella species NOT DETECTED NOT DETECTED Final   Yersinia enterocolitica NOT DETECTED NOT DETECTED Final   Vibrio species NOT DETECTED NOT DETECTED Final   Vibrio cholerae NOT DETECTED NOT DETECTED Final   Enteroaggregative E coli (EAEC) NOT DETECTED NOT DETECTED Final   Enteropathogenic E coli (EPEC) NOT DETECTED NOT DETECTED Final   Enterotoxigenic E coli (ETEC) NOT DETECTED NOT DETECTED Final   Shiga like toxin producing E coli (STEC) NOT DETECTED NOT DETECTED Final   Shigella/Enteroinvasive E coli (EIEC) NOT DETECTED NOT DETECTED Final    Cryptosporidium NOT DETECTED NOT DETECTED Final   Cyclospora cayetanensis NOT DETECTED NOT DETECTED Final   Entamoeba histolytica NOT DETECTED NOT DETECTED Final   Giardia lamblia NOT DETECTED NOT DETECTED Final   Adenovirus F40/41 NOT DETECTED NOT DETECTED Final   Astrovirus NOT DETECTED NOT DETECTED Final   Norovirus GI/GII NOT DETECTED NOT DETECTED Final   Rotavirus A NOT DETECTED NOT DETECTED Final   Sapovirus (I, II, IV, and V) NOT DETECTED NOT DETECTED Final    Comment: Performed at [facility], [address], [place], [place] [address]  Urine Culture     Status: Abnormal (Preliminary result)   Collection Time: 11/26/20  3:52 PM   Specimen: Urine, Catheterized  Result Value Ref Range Status   Specimen Description   Final    URINE, CATHETERIZED Performed at [facility], [address], [place], [place] [address]    Special Requests   Final    NONE Performed at [facility], [address] [place], [place] [address]    Culture (A)  Final    70,000 COLONIES/mL KLEBSIELLA PNEUMONIAE 70,000 COLONIES/mL ENTEROCOCCUS FAECALIS SUSCEPTIBILITIES TO FOLLOW Performed at [facility], [address], [place], [place] [address]    Report Status PENDING  Incomplete         Radiology Studies: US Venous Img Upper Uni Left (DVT)  Result Date: 11/27/2020 CLINICAL DATA:  87 year old male with left arm swelling and redness. EXAM: UPPER EXTREMITY VENOUS DOPPLER ULTRASOUND TECHNIQUE: Gray-scale sonography with graded compression, as well as color Doppler and duplex ultrasound were performed to evaluate the upper extremity deep venous system from the level of the subclavian vein and including the jugular, axillary, basilic, radial, ulnar and upper cephalic vein. Spectral Doppler was utilized to evaluate flow at rest and with distal augmentation maneuvers. COMPARISON:  None. FINDINGS: Contralateral Subclavian Vein: Respiratory phasicity is normal and  symmetric with the symptomatic side. No evidence of thrombus. Normal compressibility. Internal Jugular Vein: No evidence of thrombus. Normal compressibility, respiratory phasicity and response to augmentation. Subclavian Vein: No evidence of thrombus. Normal compressibility, respiratory phasicity and response to augmentation. Axillary Vein: Nonocclusive, subacute appearing thrombus is present. Cephalic Vein: No evidence of thrombus. Normal  compressibility, respiratory phasicity and response to augmentation. Basilic Vein: No evidence of thrombus. Normal compressibility, respiratory phasicity and response to augmentation. Brachial Veins: No evidence of thrombus. Normal compressibility, respiratory phasicity and response to augmentation. Radial Veins: No evidence of thrombus. Normal compressibility, respiratory phasicity and response to augmentation. Ulnar Veins: No evidence of thrombus. Normal compressibility, respiratory phasicity and response to augmentation. Other Findings: Subcutaneous edema is noted in the right upper extremity. IMPRESSION: Nonocclusive, subacute appearing thrombus limited the left axillary vein. Ruthann Cancer, MD [facility] Electronically Signed   By: Ruthann Cancer M.D.   On: 11/27/2020 12:23        Scheduled Meds:  amiodarone  100 mg Oral QPM   vitamin C  500 mg Oral Daily   Chlorhexidine Gluconate Cloth  6 each Topical Daily   cyanocobalamin  1,000 mcg Intramuscular Q30 days   dorzolamide-timolol  1 drop Both Eyes BID   feeding supplement  237 mL Oral TID BM   iron polysaccharides  150 mg Oral Daily   levothyroxine  100 mcg Oral QAC breakfast   mirtazapine  15 mg Oral QHS   multivitamin with minerals  1 tablet Oral Daily   pantoprazole  40 mg Oral Daily   pravastatin  20 mg Oral q1800   simethicone  80 mg Oral QID   tamsulosin  0.4 mg Oral Daily   Warfarin - Pharmacist Dosing Inpatient   Does not apply q1600    Continuous Infusions:  sodium chloride 75 mL/hr at 11/28/20 0636   heparin 1,100 Units/hr (11/28/20 0636)     LOS: 7 days    Time spent: 25 minutes    Val Riles, MD [facility] Pager [phone]  If 7PM-7AM, please contact night-coverage 11/28/2020, 12:01 PM

## 2020-11-28 NOTE — Consult Note (Signed)
ANTICOAGULATION CONSULT NOTE - Initial Consult  Pharmacy Consult for warfarin dosing  Indication: atrial fibrillation  Allergies  Allergen Reactions   Penicillins Rash and Other (See Comments)    Has patient had a PCN reaction causing immediate rash, facial/tongue/throat swelling, SOB or lightheadedness with hypotension: {no Has patient had a PCN reaction causing severe rash involving mucus membranes or skin necrosis: {no Has patient had a PCN reaction that required hospitalization {no Has patient had a PCN reaction occurring within the last 10 years: {no If all of the above answers are "NO", then may proceed with Cephalosporin use.    Patient Measurements: Height: 5\' 8"  (172.7 cm) Weight: 67.8 kg (149 lb 7.6 oz) IBW/kg (Calculated) : 68.4  Vital Signs: Temp: 97.7 F (36.5 C) (09/01 0426) BP: 121/66 (09/01 0426) Pulse Rate: 84 (09/01 0426)  Labs: Recent Labs    11/26/20 0423 11/27/20 0709 11/27/20 1630 11/28/20 0002 11/28/20 0459  HGB 10.5* 10.2*  --   --  10.4*  HCT 34.1* 34.0*  --   --  34.5*  PLT 196 201  --   --  203  APTT  --   --  44*  --   --   LABPROT 17.4* 18.3*  --   --  20.8*  INR 1.4* 1.5*  --   --  1.8*  HEPARINUNFRC  --   --   --  0.36  --   CREATININE 4.45* 4.26*  --   --  4.22*     Estimated Creatinine Clearance: 10 mL/min (A) (by C-G formula based on SCr of 4.22 mg/dL (H)).   Medical History: Past Medical History:  Diagnosis Date   Anemia    Aortic atherosclerosis ([name])    Balance problems    uses walker   Cardiac pacemaker in situ 07/2005   symptomatic bradycardia   CKD (chronic kidney disease) stage 3, GFR 30-59 ml/min ([name]) 04/13/2017   Closed right hip fracture, initial encounter ([place]) 02/14/2019   Diverticulosis of colon (without mention of hemorrhage)    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    GERD (gastroesophageal reflux disease)    Glaucoma    Hyperlipidemia    Hypertension    Neck arthritis, Severe, multi-level  06/09/2013   Paroxysmal atrial fibrillation ([name])    s/p failed ablation   Prostate cancer ([place]) 12/2000   5 weeks XRT + brachytherapy   Radiation proctitis    Sinoatrial node dysfunction ([name])    Skin cancer    Skin cancer of eyelid, left 10/2007   Unspecified glaucoma(365.9)    Unspecified hypothyroidism    Vitamin B12 deficiency     Medications:  Scheduled:   amiodarone  100 mg Oral QPM   Chlorhexidine Gluconate Cloth  6 each Topical Daily   cyanocobalamin  1,000 mcg Intramuscular Q30 days   dorzolamide-timolol  1 drop Both Eyes BID   feeding supplement  237 mL Oral TID BM   levothyroxine  100 mcg Oral QAC breakfast   mirtazapine  15 mg Oral QHS   multivitamin with minerals  1 tablet Oral Daily   pantoprazole  40 mg Oral Daily   pravastatin  20 mg Oral q1800   simethicone  80 mg Oral QID   tamsulosin  0.4 mg Oral Daily   Warfarin - Pharmacist Dosing Inpatient   Does not apply q1600    Assessment: 87yo M with PMH of CKD3, anemia, Afib on warfarin, cardiac pacemaker 2007, HLD, HTN, prostate cancer 2002, glaucoma, hypothyroid presented to  the hospital with complaints of weakness and worsening renal function. Pharmacy has been consulted for warfarin dosing.   On admission on 8/25 pt INR was therapeutic at 2.8 on his home regimen (0.5mg  Sat-Thurs and 1mg  Fri, ~0.57mg  daily). This regimen was confirmed with pt son. Upon review of the pt chart, a significant drug interaction with amiodarone and warfarin was found, however both medications were taken PTA and no signs of toxicity were seen.   Venous duplex on 8/31 showed nonocclusive, subacute appearing thrombus limited the left axillary vein    Date INR Action Taken 8/25 2.8 Hold 8/26 3.0 Hold 8/27 3.0 Hold 8/28 2.0 Hold 8/29 1.6 Hold 8/30 1.4 Hold 8/31 1.5 Warfarin 1mg  9/1 1.8 Warfarin 1mg   Date  HL, interpretation  9/1 0002 0.36, therapeutic  9/1 0759 0.32, therapeutic x2  Goal of Therapy:  INR 2-3 Heparin level  0.3-0.7 units/ml Monitor platelets by anticoagulation protocol: Yes   Plan:   -Warfarin   -Given subtherapeutic INR, the dose given today will be ~47% increase in dose of what pt usually receives at home.    -Will give Warfarin 1mg  x1   -Will repeat INR with AM labs   -Heparin   -Second consecutive therapeutic heparin level, per protocol will move to once daily monitoring    -Will continue heparin infusion at 1100 units/hr   -Will repeat CBC with AM labs  Narda Rutherford, PharmD Pharmacy Resident  11/28/2020 10:27 AM

## 2020-11-28 NOTE — Consult Note (Signed)
[place] for Heparin Indication:  Nonocclusive, subacute appearing thrombus limited the left axillary.   Allergies  Allergen Reactions   Penicillins Rash and Other (See Comments)    Has patient had a PCN reaction causing immediate rash, facial/tongue/throat swelling, SOB or lightheadedness with hypotension: {no Has patient had a PCN reaction causing severe rash involving mucus membranes or skin necrosis: {no Has patient had a PCN reaction that required hospitalization {no Has patient had a PCN reaction occurring within the last 10 years: {no If all of the above answers are "NO", then may proceed with Cephalosporin use.    Patient Measurements: Height: 5\' 8"  (172.7 cm) Weight: 69.4 kg (153 lb) IBW/kg (Calculated) : 68.4 Heparin Dosing Weight: 69.4 kg  Vital Signs: Temp: 97.8 F (36.6 C) (08/31 2027) Temp Source: Axillary (08/31 1625) BP: 109/57 (08/31 2027) Pulse Rate: 58 (08/31 2027)  Labs: Recent Labs    11/25/20 0404 11/26/20 0423 11/27/20 0709 11/27/20 1630 11/28/20 0002  HGB 10.1* 10.5* 10.2*  --   --   HCT 31.9* 34.1* 34.0*  --   --   PLT 201 196 201  --   --   APTT  --   --   --  44*  --   LABPROT 18.6* 17.4* 18.3*  --   --   INR 1.6* 1.4* 1.5*  --   --   HEPARINUNFRC  --   --   --   --  0.36  CREATININE 4.66* 4.45* 4.26*  --   --      Estimated Creatinine Clearance: 10 mL/min (A) (by C-G formula based on SCr of 4.26 mg/dL (H)).   Medical History: Past Medical History:  Diagnosis Date   Anemia    Aortic atherosclerosis ([name])    Balance problems    uses walker   Cardiac pacemaker in situ 07/2005   symptomatic bradycardia   CKD (chronic kidney disease) stage 3, GFR 30-59 ml/min ([name]) 04/13/2017   Closed right hip fracture, initial encounter ([place]) 02/14/2019   Diverticulosis of colon (without mention of hemorrhage)    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    GERD (gastroesophageal reflux disease)     Glaucoma    Hyperlipidemia    Hypertension    Neck arthritis, Severe, multi-level 06/09/2013   Paroxysmal atrial fibrillation ([name])    s/p failed ablation   Prostate cancer ([place]) 12/2000   5 weeks XRT + brachytherapy   Radiation proctitis    Sinoatrial node dysfunction ([name])    Skin cancer    Skin cancer of eyelid, left 10/2007   Unspecified glaucoma(365.9)    Unspecified hypothyroidism    Vitamin B12 deficiency     Medications:  Medications Prior to Admission  Medication Sig Dispense Refill Last Dose   alendronate (FOSAMAX) 70 MG tablet TAKE 1 TABLET BY MOUTH EVERY 7 DAYS WITH A FULL GLASS OF WATER AND ON AN EMPTY STOMACH (Patient taking differently: Take 70 mg by mouth every Thursday. TAKE 1 TABLET BY MOUTH EVERY 7 DAYS WITH A FULL GLASS OF WATER AND ON AN EMPTY STOMACH) 12 tablet 3 11/14/2020 at unk   amiodarone (PACERONE) 200 MG tablet TAKE 1/2 TABLET(100 MG) BY MOUTH DAILY (Patient taking differently: Take 100 mg by mouth every evening.) 45 tablet 3 11/20/2020 at 2100   cyanocobalamin (,VITAMIN B-12,) 1000 MCG/ML injection Inject 1,000 mcg into the muscle every 30 (thirty) days.   Past Month at unk   docusate sodium (COLACE) 50 MG  capsule Take 50 mg by mouth 2 (two) times daily as needed for mild constipation.   prn at prn   dorzolamide-timolol (COSOPT) 22.3-6.8 MG/ML ophthalmic solution Place 1 drop into both eyes 2 (two) times daily.   11/21/2020 at 1130   levothyroxine (SYNTHROID) 100 MCG tablet TAKE 1 TABLET BY MOUTH DAILY BEFORE BREAKFAST 90 tablet 3 11/20/2020 at am   loperamide (IMODIUM) 2 MG capsule Take 2 mg by mouth as needed for diarrhea or loose stools.   prn at prn   mirtazapine (REMERON) 15 MG tablet Take 1 tablet (15 mg total) by mouth at bedtime. 30 tablet 3 11/20/2020 at 2100   pantoprazole (PROTONIX) 40 MG tablet TAKE 1 TABLET(40 MG) BY MOUTH DAILY 90 tablet 3 11/21/2020 at 1130   polyethylene glycol (MIRALAX / GLYCOLAX) packet Take 17 g by mouth daily as needed for  moderate constipation.    prn at prn   pravastatin (PRAVACHOL) 40 MG tablet TAKE 1/2 TABLET(20 MG) BY MOUTH AT BEDTIME 45 tablet 3 11/20/2020 at 2100   simethicone (MYLICON) 425 MG chewable tablet Chew 125 mg by mouth every 6 (six) hours as needed for flatulence.   prn at prn   tamsulosin (FLOMAX) 0.4 MG CAPS capsule TAKE 1 CAPSULE BY MOUTH DAILY 90 capsule 3 11/20/2020 at am   traMADol (ULTRAM) 50 MG tablet Take 1 tablet (50 mg total) by mouth every 6 (six) hours as needed for moderate pain. 120 tablet 1 11/21/2020 at am   warfarin (COUMADIN) 1 MG tablet TAKE 1 TO 1 AND 1/2 TABLETS BY MOUTH DAILY AS DIRECTED BY COUMADIN CLINIC (Patient taking differently: Take 0.5-1 mg by mouth at bedtime. TAKE 1 TO 1 AND 1/2 TABLETS BY MOUTH DAILY AS DIRECTED BY COUMADIN CLINIC  0.5 mg everyday except for fri he gets 1 mg) 45 tablet 2 11/20/2020 at 2100   Wheat Dextrin (BENEFIBER DRINK MIX PO) Take 1 scoop by mouth daily.   prn at prn   acetaminophen (TYLENOL) 500 MG tablet Take 500 mg by mouth every 6 (six) hours as needed for moderate pain or headache. (Patient not taking: Reported on 11/21/2020)   Not Taking   furosemide (LASIX) 20 MG tablet Take 1 tablet (20 mg total) by mouth daily. (Patient not taking: Reported on 11/21/2020) 90 tablet 1 Not Taking   Scheduled:   amiodarone  100 mg Oral QPM   Chlorhexidine Gluconate Cloth  6 each Topical Daily   cyanocobalamin  1,000 mcg Intramuscular Q30 days   dorzolamide-timolol  1 drop Both Eyes BID   feeding supplement  237 mL Oral TID BM   levothyroxine  100 mcg Oral QAC breakfast   mirtazapine  15 mg Oral QHS   multivitamin with minerals  1 tablet Oral Daily   pantoprazole  40 mg Oral Daily   pravastatin  20 mg Oral q1800   simethicone  80 mg Oral QID   tamsulosin  0.4 mg Oral Daily   Warfarin - Pharmacist Dosing Inpatient   Does not apply q1600   Infusions:   sodium chloride 75 mL/hr at 11/27/20 2158   heparin 1,100 Units/hr (11/27/20 1741)   PRN:  acetaminophen **OR** acetaminophen, calcium carbonate, camphor-menthol **AND** hydrOXYzine, cholestyramine light, docusate sodium, docusate sodium, hydrALAZINE, loperamide, ondansetron **OR** ondansetron (ZOFRAN) IV, polyethylene glycol, sorbitol, traMADol, zolpidem Anti-infectives (From admission, onward)    Start     Dose/Rate Route Frequency Ordered Stop   11/26/20 1700  cefTRIAXone (ROCEPHIN) 1 g in sodium chloride 0.9 % 100 mL  IVPB        1 g 200 mL/hr over 30 Minutes Intravenous On call 11/25/20 1713 11/26/20 1516       Assessment: Pt found to have nonocclusive, subacute appearing thrombus limited the left axillary. Pharmacy consulted to start heparin infusion. Pt has a hx of afib and is currently on warfarin. INR is 1.5 (subtherapeutic). Plan to give warfarin 1mg  on 8/31. Pt will need to bridge with warfarin x 5 days and until INR therapeutic for > 24 hours.   Goal of Therapy:  INR 2-3 Heparin level 0.3-0.7 units/ml Monitor platelets by anticoagulation protocol: Yes   Plan:  9/1:  HL @ 0002 = 0.36 Will continue pt on current rate and draw confirmation level in 8 hrs on 9/1 @ 0800.   Zayn Selley D, PharmD 11/28/2020,1:24 AM

## 2020-11-28 NOTE — Progress Notes (Signed)
[facility]  PROGRESS NOTE   Subjective:   Patient seen sitting up in bed reading newspaper Alert and oriented States he ate a good breakfast Denies nausea Denies shortness of breath Denies pain and discomfort   Objective:  Vital signs in last 24 hours:  Temp:  [97.6 F (36.4 C)-97.8 F (36.6 C)] 97.6 F (36.4 C) (09/01 0829) Pulse Rate:  [58-87] 87 (09/01 0829) Resp:  [16-17] 16 (09/01 0829) BP: (95-143)/(56-66) 95/60 (09/01 0829) SpO2:  [100 %] 100 % (09/01 0829) Weight:  [67.8 kg] 67.8 kg (09/01 0500)  Weight change: 6.7 kg Filed Weights   11/26/20 1409 11/27/20 0500 11/28/20 0500  Weight: 61.1 kg 69.4 kg 67.8 kg    Intake/Output: I/O last 3 completed shifts: In: 4338.9 [P.O.:240; I.V.:4098.9] Out: 970 [Urine:970]   Intake/Output this shift:  Total I/O In: 240 [P.O.:240] Out: -   Physical Exam: General:  No acute distress  Head:  Normocephalic, atraumatic. Moist oral mucosal membranes  Eyes:  Anicteric  Lungs:   Clear to auscultation, normal effort  Heart:  S1S2 no rubs  Abdomen:   Soft, nontender, bowel sounds present  Extremities:  No BLE peripheral edema, LUE edema, erythema.  Neurologic:  Alert and oriented  Skin:  No lesions       Basic Metabolic Panel: Recent Labs  Lab 11/22/20 0444 11/23/20 0744 11/24/20 0808 11/25/20 0404 11/26/20 0423 11/27/20 0709 11/28/20 0459  NA 137   < > 140 140 142 142 141  K 4.1   < > 3.6 3.4* 4.3 4.6 4.7  CL 108   < > 106 111 112* 115* 115*  CO2 20*   < > 25 23 21* 21* 23  GLUCOSE 138*   < > 88 105* 95 104* 113*  BUN 89*   < > 85* 80* 75* 76* 73*  CREATININE 4.77*   < > 4.55* 4.66* 4.45* 4.26* 4.22*  CALCIUM 8.1*   < > 8.4* 8.1* 8.5* 8.5* 8.1*  MG  --   --   --   --   --   --  2.0  PHOS 6.8*  --   --   --   --   --  6.0*   < > = values in this interval not displayed.     CBC: Recent Labs  Lab 11/24/20 0808 11/25/20 0404 11/26/20 0423 11/27/20 0709 11/28/20 0459  WBC 6.2 6.4 7.3 9.3  10.7*  NEUTROABS 3.6 3.7 4.5 7.7 8.4*  HGB 10.5* 10.1* 10.5* 10.2* 10.4*  HCT 31.9* 31.9* 34.1* 34.0* 34.5*  MCV 97.9 99.7 104.0* 105.6* 107.1*  PLT 205 201 196 201 203      Urinalysis: No results for input(s): COLORURINE, LABSPEC, PHURINE, GLUCOSEU, HGBUR, BILIRUBINUR, KETONESUR, PROTEINUR, UROBILINOGEN, NITRITE, LEUKOCYTESUR in the last 72 hours.  Invalid input(s): APPERANCEUR     Imaging: US Venous Img Upper Uni Left (DVT)  Result Date: 11/27/2020 CLINICAL DATA:  87 year old male with left arm swelling and redness. EXAM: UPPER EXTREMITY VENOUS DOPPLER ULTRASOUND TECHNIQUE: Gray-scale sonography with graded compression, as well as color Doppler and duplex ultrasound were performed to evaluate the upper extremity deep venous system from the level of the subclavian vein and including the jugular, axillary, basilic, radial, ulnar and upper cephalic vein. Spectral Doppler was utilized to evaluate flow at rest and with distal augmentation maneuvers. COMPARISON:  None. FINDINGS: Contralateral Subclavian Vein: Respiratory phasicity is normal and symmetric with the symptomatic side. No evidence of thrombus. Normal compressibility. Internal Jugular Vein: No  evidence of thrombus. Normal compressibility, respiratory phasicity and response to augmentation. Subclavian Vein: No evidence of thrombus. Normal compressibility, respiratory phasicity and response to augmentation. Axillary Vein: Nonocclusive, subacute appearing thrombus is present. Cephalic Vein: No evidence of thrombus. Normal compressibility, respiratory phasicity and response to augmentation. Basilic Vein: No evidence of thrombus. Normal compressibility, respiratory phasicity and response to augmentation. Brachial Veins: No evidence of thrombus. Normal compressibility, respiratory phasicity and response to augmentation. Radial Veins: No evidence of thrombus. Normal compressibility, respiratory phasicity and response to augmentation. Ulnar Veins:  No evidence of thrombus. Normal compressibility, respiratory phasicity and response to augmentation. Other Findings: Subcutaneous edema is noted in the right upper extremity. IMPRESSION: Nonocclusive, subacute appearing thrombus limited the left axillary vein. [name] Cancer, MD [facility] Radiology Electronically Signed   By: [name] [name] M.D.   On: 11/27/2020 12:23     Medications:    sodium chloride 75 mL/hr at 11/28/20 1215   heparin 1,100 Units/hr (11/28/20 0636)    amiodarone  100 mg Oral QPM   vitamin C  500 mg Oral Daily   Chlorhexidine Gluconate Cloth  6 each Topical Daily   cyanocobalamin  1,000 mcg Intramuscular Q30 days   dorzolamide-timolol  1 drop Both Eyes BID   feeding supplement  237 mL Oral TID BM   iron polysaccharides  150 mg Oral Daily   levothyroxine  100 mcg Oral QAC breakfast   mirtazapine  15 mg Oral QHS   multivitamin with minerals  1 tablet Oral Daily   pantoprazole  40 mg Oral Daily   pravastatin  20 mg Oral q1800   simethicone  80 mg Oral QID   tamsulosin  0.4 mg Oral Daily   Warfarin - Pharmacist Dosing Inpatient   Does not apply q1600    Assessment/ Plan:     Principal Problem:   AKI (acute kidney injury) ([place]) Active Problems:   Hypothyroidism   Hyperlipidemia   Essential hypertension   GERD   Prostate [name], in remission   PACEMAKER, PERMANENT   Long term (current) use of anticoagulants   Longstanding persistent atrial fibrillation ([name])   Chronic kidney disease (CKD), stage IV (severe) ([name])   Protein-calorie malnutrition, severe  #1 acute kidney injury on  chronic kidney disease st 4.  Most likely due to obstructive disease.  Baseline creatinine 2.71 from 10/16/20. GFR 20 Patient underwent cystoscopy with urethral dilatation on November 26, 2020 Foley catheter in place since surgery, recommended placement for 7 days. Creatinine remained stable today. UOP 675ml. Will d/c IVF and encourage  patient to continue oral nutrition. Will need to follow up with nephrology at discharge. Will continue to monitor while inpatient Lab Results  Component Value Date   CREATININE 4.22 (H) 11/28/2020   CREATININE 4.26 (H) 11/27/2020   CREATININE 4.45 (H) 11/26/2020      #2:  Mental status change Believed from procedure anesthesia  Alert and oriented today   #3: Hyperkalemia: Currently 4.7.   #4 left arm Edema [place] r/o DVT, negative for thrombus Elevated on pillows     LOS: 7 Colon Flattery, NP [facility] 9/1/202212:22 PM

## 2020-11-28 NOTE — Progress Notes (Signed)
Physical Therapy Treatment Patient Details Name: [name] [name] MRN: [id number] DOB: 1922/04/30 Today's Date: 11/28/2020    History of Present Illness 87 yo male with onset of many falls was admitted with worsening renal function but had recent rib fractures on L side 7&8 with pneumothorax, now has pleural effusion and abnormal lab values.  Has suspected UTI, malnutrition, incontinence, general weakness.  PMHx:  HTN, CKD 4, bradycardia, pacemaker, prostate CA, PAF, diverticular disease, GERD,    PT Comments    Patient agreeable to PT and requesting to sit up on edge of bed. He is alert and cooperative during session, able to follow all commands without difficulty. Patient required moderate assistance for bed mobility with good sitting balance. No shortness of breath noted with Sp02 95% with activity. Limited activity tolerance overall. Recommend to continue PT to maximize independence and facilitate return to prior level of function. SNF recommended at discharge.    Follow Up Recommendations  SNF     Equipment Recommendations  None recommended by PT    Recommendations for Other Services       Precautions / Restrictions Precautions Precautions: Fall Restrictions Weight Bearing Restrictions: No    Mobility  Bed Mobility Overal bed mobility: Needs Assistance Bed Mobility: Supine to Sit;Sit to Supine     Supine to sit: Mod assist Sit to supine: Mod assist   General bed mobility comments: assistance for LE and trunk support. verbal cues for technique. increased time and effort required to complete tasks    Transfers                 General transfer comment: not attempted  Ambulation/Gait                 Stairs             Wheelchair Mobility    Modified Rankin (Stroke Patients Only)       Balance   Sitting-balance support: No upper extremity supported;Feet supported Sitting balance-Leahy Scale: Good Sitting balance - Comments: once feet  supported on floor, good sitting balance demonstrated                                    Cognition Arousal/Alertness: Awake/alert Behavior During Therapy: WFL for tasks assessed/performed Overall Cognitive Status: Within Functional Limits for tasks assessed                                 General Comments: patient is able to follow commands without difficulty      Exercises Other Exercises Other Exercises: Sp02 95% and no shortness of breath noted with activity. heart rate in the 80's    General Comments        Pertinent Vitals/Pain Pain Assessment: No/denies pain    Home Living                      Prior Function            PT Goals (current goals can now be found in the care plan section) Acute Rehab PT Goals Patient Stated Goal: to go home PT Goal Formulation: With patient Time For Goal Achievement: 12/25/2020 Potential to Achieve Goals: Good Progress towards PT goals: Progressing toward goals    Frequency    Min 2X/week      PT Plan Current plan remains appropriate  Co-evaluation              AM-PAC PT "6 Clicks" Mobility   Outcome Measure  Help needed turning from your back to your side while in a flat bed without using bedrails?: A Little Help needed moving from lying on your back to sitting on the side of a flat bed without using bedrails?: A Lot Help needed moving to and from a bed to a chair (including a wheelchair)?: A Lot Help needed standing up from a chair using your arms (e.g., wheelchair or bedside chair)?: A Lot Help needed to walk in hospital room?: A Lot Help needed climbing 3-5 steps with a railing? : A Lot 6 Click Score: 13    End of Session Equipment Utilized During Treatment: Oxygen Activity Tolerance: Patient tolerated treatment well Patient left: in bed;with call bell/phone within reach;with SCD's reapplied;with bed alarm set   PT Visit Diagnosis: Unsteadiness on feet (R26.81);Muscle  weakness (generalized) (M62.81);Repeated falls (R29.6);Difficulty in walking, not elsewhere classified (R26.2)     Time: 9702-6378 PT Time Calculation (min) (ACUTE ONLY): 13 min  Charges:  $Therapeutic Activity: 8-22 mins                     Minna Merritts, PT, MPT    Percell Locus 11/28/2020, 3:44 PM

## 2020-11-28 NOTE — TOC Progression Note (Signed)
Transition of Care [facility]) - Progression Note    Patient Details  Name: [name] [name] MRN: [id number] Date of Birth: 07-12-1922  Transition of Care [facility]) CM/SW [place], LCSW Phone Number: 11/28/2020, 9:39 AM  Clinical Narrative:  SNF insurance authorization is good through tomorrow.   Expected Discharge Plan: Skilled Nursing Facility Barriers to Discharge: Continued Medical Work up  Expected Discharge Plan and Services Expected Discharge Plan: [place] arrangements for the past 2 months: Single Family Home                                       Social Determinants of Health (SDOH) Interventions    Readmission Risk Interventions Readmission Risk Prevention Plan 11/23/2020  Transportation Screening Complete  PCP or Specialist Appt within 3-5 Days Complete  HRI or Home Care Consult Complete  Social Work Consult for [place] Planning/Counseling Complete  Palliative Care Screening Not Applicable  Medication Review [employer]) Complete  Some recent data might be hidden

## 2020-11-28 NOTE — Care Management Important Message (Signed)
Important Message  Patient Details  Name: ALANI LACIVITA MRN: [id number] Date of Birth: [birth year]   Medicare Important Message Given:  Yes     Dannette Barbara 11/28/2020, 1:42 PM

## 2020-11-29 LAB — CBC WITH DIFFERENTIAL/PLATELET
Abs Immature Granulocytes: 0.16 10*3/uL — ABNORMAL HIGH (ref 0.00–0.07)
Basophils Absolute: 0 10*3/uL (ref 0.0–0.1)
Basophils Relative: 0 %
Eosinophils Absolute: 0.5 10*3/uL (ref 0.0–0.5)
Eosinophils Relative: 5 %
HCT: 34.3 % — ABNORMAL LOW (ref 39.0–52.0)
Hemoglobin: 10.2 g/dL — ABNORMAL LOW (ref 13.0–17.0)
Immature Granulocytes: 2 %
Lymphocytes Relative: 16 %
Lymphs Abs: 1.7 10*3/uL (ref 0.7–4.0)
MCH: 31.9 pg (ref 26.0–34.0)
MCHC: 29.7 g/dL — ABNORMAL LOW (ref 30.0–36.0)
MCV: 107.2 fL — ABNORMAL HIGH (ref 80.0–100.0)
Monocytes Absolute: 0.9 10*3/uL (ref 0.1–1.0)
Monocytes Relative: 8 %
Neutro Abs: 7.2 10*3/uL (ref 1.7–7.7)
Neutrophils Relative %: 69 %
Platelets: 195 10*3/uL (ref 150–400)
RBC: 3.2 MIL/uL — ABNORMAL LOW (ref 4.22–5.81)
RDW: 15.3 % (ref 11.5–15.5)
WBC: 10.4 10*3/uL (ref 4.0–10.5)
nRBC: 0 % (ref 0.0–0.2)

## 2020-11-29 LAB — URINE CULTURE: Culture: 70000 — AB

## 2020-11-29 LAB — PROTIME-INR
INR: 1.9 — ABNORMAL HIGH (ref 0.8–1.2)
Prothrombin Time: 21.7 seconds — ABNORMAL HIGH (ref 11.4–15.2)

## 2020-11-29 LAB — BASIC METABOLIC PANEL
Anion gap: 3 — ABNORMAL LOW (ref 5–15)
BUN: 62 mg/dL — ABNORMAL HIGH (ref 8–23)
CO2: 22 mmol/L (ref 22–32)
Calcium: 8.1 mg/dL — ABNORMAL LOW (ref 8.9–10.3)
Chloride: 116 mmol/L — ABNORMAL HIGH (ref 98–111)
Creatinine, Ser: 3.99 mg/dL — ABNORMAL HIGH (ref 0.61–1.24)
GFR, Estimated: 13 mL/min — ABNORMAL LOW (ref >60)
Glucose, Bld: 116 mg/dL — ABNORMAL HIGH (ref 70–99)
Potassium: 4.2 mmol/L (ref 3.5–5.1)
Sodium: 141 mmol/L (ref 135–145)

## 2020-11-29 LAB — MAGNESIUM: Magnesium: 2 mg/dL (ref 1.7–2.4)

## 2020-11-29 LAB — SARS CORONAVIRUS 2 (TAT 6-24 HRS): SARS Coronavirus 2: NEGATIVE

## 2020-11-29 LAB — HEPARIN LEVEL (UNFRACTIONATED): Heparin Unfractionated: 0.57 IU/mL (ref 0.30–0.70)

## 2020-11-29 LAB — PHOSPHORUS: Phosphorus: 5 mg/dL — ABNORMAL HIGH (ref 2.5–4.6)

## 2020-11-29 MED ORDER — TRAMADOL HCL 50 MG PO TABS: 50.0000 mg | ORAL_TABLET | Freq: Four times a day (QID) | ORAL | 0 refills | Status: AC | PRN

## 2020-11-29 MED ORDER — ASCORBIC ACID 500 MG PO TABS: 500.0000 mg | ORAL_TABLET | Freq: Every day | ORAL | 2 refills | Status: AC

## 2020-11-29 MED ORDER — WARFARIN 0.5 MG HALF TABLET
0.5000 mg | ORAL_TABLET | Freq: Once | ORAL | Status: AC
  Administered 2020-11-29: 0.5 mg via ORAL
  Filled 2020-11-29: qty 1

## 2020-11-29 MED ORDER — POLYSACCHARIDE IRON COMPLEX 150 MG PO CAPS: 150.0000 mg | ORAL_CAPSULE | Freq: Every day | ORAL | 2 refills | Status: AC

## 2020-11-29 NOTE — Consult Note (Signed)
ANTICOAGULATION CONSULT NOTE - Initial Consult  Pharmacy Consult for warfarin dosing  Indication: atrial fibrillation  Allergies  Allergen Reactions   Penicillins Rash and Other (See Comments)    Has patient had a PCN reaction causing immediate rash, facial/tongue/throat swelling, SOB or lightheadedness with hypotension: {no Has patient had a PCN reaction causing severe rash involving mucus membranes or skin necrosis: {no Has patient had a PCN reaction that required hospitalization {no Has patient had a PCN reaction occurring within the last 10 years: {no If all of the above answers are "NO", then may proceed with Cephalosporin use.    Patient Measurements: Height: 5\' 8"  (172.7 cm) Weight: 70.4 kg (155 lb 3.3 oz) IBW/kg (Calculated) : 68.4  Vital Signs: Temp: 97.4 F (36.3 C) (09/02 0730) Temp Source: Oral (09/02 0730) BP: 98/69 (09/02 0730) Pulse Rate: 74 (09/02 0730)  Labs: Recent Labs    11/27/20 0709 11/27/20 1630 11/28/20 0002 11/28/20 0459 11/28/20 0759 11/29/20 0447  HGB 10.2*  --   --  10.4*  --  10.2*  HCT 34.0*  --   --  34.5*  --  34.3*  PLT 201  --   --  203  --  195  APTT  --  44*  --   --   --   --   LABPROT 18.3*  --   --  20.8*  --  21.7*  INR 1.5*  --   --  1.8*  --  1.9*  HEPARINUNFRC  --   --  0.36  --  0.32 0.57  CREATININE 4.26*  --   --  4.22*  --  3.99*     Estimated Creatinine Clearance: 10.7 mL/min (A) (by C-G formula based on SCr of 3.99 mg/dL (H)).   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    GERD (gastroesophageal reflux disease)     Glaucoma    Hyperlipidemia    Hypertension    Neck arthritis, Severe, multi-level 06/09/2013   Paroxysmal atrial fibrillation ([name])    s/p failed ablation   Prostate cancer ([place]) 12/2000   5 weeks XRT + brachytherapy   Radiation proctitis    Sinoatrial node dysfunction ([name])    Skin cancer    Skin cancer of eyelid, left 10/2007   Unspecified glaucoma(365.9)    Unspecified hypothyroidism    Vitamin B12 deficiency     Medications:  Scheduled:   amiodarone  100 mg Oral QPM   vitamin C  500 mg Oral Daily   Chlorhexidine Gluconate Cloth  6 each Topical Daily   cyanocobalamin  1,000 mcg Intramuscular Q30 days   dorzolamide-timolol  1 drop Both Eyes BID   feeding supplement  237 mL Oral TID BM   iron polysaccharides  150 mg Oral Daily   levothyroxine  100 mcg Oral QAC breakfast   mirtazapine  15 mg Oral QHS   multivitamin with minerals  1 tablet Oral Daily   pantoprazole  40 mg Oral Daily   pravastatin  20 mg Oral q1800   simethicone  80 mg Oral QID  tamsulosin  0.4 mg Oral Daily   Warfarin - Pharmacist Dosing Inpatient   Does not apply q1600    Assessment: 87yo M with PMH of CKD3, anemia, Afib on warfarin, cardiac pacemaker 2007, HLD, HTN, prostate cancer 2002, glaucoma, hypothyroid presented to the hospital with complaints of weakness and worsening renal function. Pharmacy has been consulted for warfarin dosing.   On admission on 8/25 pt INR was therapeutic at 2.8 on his home regimen (0.5mg  M,T,W,Th,S,S and 1mg  Fri, ~0.57mg  daily). This regimen was confirmed with pt son. Upon review of the pt chart, a significant drug interaction with amiodarone and warfarin was found, however both medications were taken PTA and no signs of toxicity were seen.   Venous duplex on 8/31 showed nonocclusive, subacute appearing thrombus limited the left axillary vein    Date INR Action  Taken 8/25 2.8 Hold 8/26 3.0 Hold 8/27 3.0 Hold 8/28 2.0 Hold 8/29 1.6 Hold 8/30 1.4 Hold 8/31 1.5 Warfarin 1mg  9/1 1.8 1 mg 9/2 1.9   Date  HL, interpretation  9/1 0002 0.36, therapeutic  9/1 0759 0.32, therapeutic x2 9/2  0447 0.57 therapetuic  Goal of Therapy:  INR 2-3 Heparin level 0.3-0.7 units/ml Monitor platelets by anticoagulation protocol: Yes   Plan:   -Warfarin INR barely subtherapeutic   -Will give Warfarin 0.5 mg today    -Will repeat INR with AM labs  DDI: Amiodarone(on both PTA)  -Heparin   -consecutive therapeutic heparin level, per protocol will move to once daily monitoring    -Will continue heparin infusion at 1100 units/hr   -Will repeat CBC with AM labs  Yaden Seith A, PharmD 11/29/2020 8:52 AM

## 2020-11-29 NOTE — TOC Transition Note (Signed)
Transition of Care [facility]) - CM/SW Discharge Note   Patient Details  Name: [name] [name] MRN: [id number] Date of Birth: [birth year]  Transition of Care [facility]) CM/SW Contact:  Candie Chroman, LCSW Phone Number: 11/29/2020, 3:42 PM   Clinical Narrative:  Patient has orders to discharge to [facility] today. RN will call report to [phone]. EMS transport has been arranged and he is 5th on the list. No further concerns. CSW signing off.   Final next level of care: Skilled Nursing Facility Barriers to Discharge: Barriers Resolved   Patient Goals and CMS Choice Patient states their goals for this hospitalization and ongoing recovery are:: SNF rehab CMS Medicare.gov Compare Post Acute Care list provided to:: Patient Choice offered to / list presented to : Patient, Adult Children  Discharge Placement   Existing PASRR number confirmed : 11/26/20          Patient chooses bed at: [facility] Patient to be transferred to facility by: EMS Name of family member notified: Chrissie Noa Patient and family notified of of transfer: 11/29/20  Discharge Plan and Services                                     Social Determinants of Health (SDOH) Interventions     Readmission Risk Interventions Readmission Risk Prevention Plan 11/23/2020  Transportation Screening Complete  PCP or Specialist Appt within 3-5 Days Complete  HRI or [place] Complete  Social Work Consult for [place] Planning/Counseling Complete  Palliative Care Screening Not Applicable  Medication Review Press photographer) Complete  Some recent data might be hidden

## 2020-11-29 NOTE — Consult Note (Signed)
[place] for Heparin Indication:  Nonocclusive, subacute appearing thrombus limited the left axillary.   Allergies  Allergen Reactions   Penicillins Rash and Other (See Comments)    Has patient had a PCN reaction causing immediate rash, facial/tongue/throat swelling, SOB or lightheadedness with hypotension: {no Has patient had a PCN reaction causing severe rash involving mucus membranes or skin necrosis: {no Has patient had a PCN reaction that required hospitalization {no Has patient had a PCN reaction occurring within the last 10 years: {no If all of the above answers are "NO", then may proceed with Cephalosporin use.    Patient Measurements: Height: 5\' 8"  (172.7 cm) Weight: 70.4 kg (155 lb 3.3 oz) IBW/kg (Calculated) : 68.4 Heparin Dosing Weight: 69.4 kg  Vital Signs: Temp: 98.3 F (36.8 C) (09/01 2103) Temp Source: Oral (09/01 2103) BP: 108/59 (09/01 2103) Pulse Rate: 59 (09/01 2103)  Labs: Recent Labs    11/27/20 0709 11/27/20 1630 11/28/20 0002 11/28/20 0459 11/28/20 0759 11/29/20 0447  HGB 10.2*  --   --  10.4*  --  10.2*  HCT 34.0*  --   --  34.5*  --  34.3*  PLT 201  --   --  203  --  195  APTT  --  44*  --   --   --   --   LABPROT 18.3*  --   --  20.8*  --  21.7*  INR 1.5*  --   --  1.8*  --  1.9*  HEPARINUNFRC  --   --  0.36  --  0.32 0.57  CREATININE 4.26*  --   --  4.22*  --  3.99*     Estimated Creatinine Clearance: 10.7 mL/min (A) (by C-G formula based on SCr of 3.99 mg/dL (H)).   Medical History: Past Medical History:  Diagnosis Date   Anemia    Aortic atherosclerosis ([name])    Balance problems    uses walker   Cardiac pacemaker in situ 07/2005   symptomatic bradycardia   CKD (chronic kidney disease) stage 3, GFR 30-59 ml/min ([name]) 04/13/2017   Closed right hip fracture, initial encounter ([place]) 02/14/2019   Diverticulosis of colon (without mention of hemorrhage)    Gastroenteritis and colitis due to  radiation    GERD (gastroesophageal reflux disease)    Glaucoma    Hyperlipidemia    Hypertension    Neck arthritis, Severe, multi-level 06/09/2013   Paroxysmal atrial fibrillation ([name])    s/p failed ablation   Prostate cancer ([place]) 12/2000   5 weeks XRT + brachytherapy   Radiation proctitis    Sinoatrial node dysfunction ([name])    Skin cancer    Skin cancer of eyelid, left 10/2007   Unspecified glaucoma(365.9)    Unspecified hypothyroidism    Vitamin B12 deficiency     Medications:  Medications Prior to Admission  Medication Sig Dispense Refill Last Dose   alendronate (FOSAMAX) 70 MG tablet TAKE 1 TABLET BY MOUTH EVERY 7 DAYS WITH A FULL GLASS OF WATER AND ON AN EMPTY STOMACH (Patient taking differently: Take 70 mg by mouth every Thursday. TAKE 1 TABLET BY MOUTH EVERY 7 DAYS WITH A FULL GLASS OF WATER AND ON AN EMPTY STOMACH) 12 tablet 3 11/14/2020 at unk   amiodarone (PACERONE) 200 MG tablet TAKE 1/2 TABLET(100 MG) BY MOUTH DAILY (Patient taking differently: Take 100 mg by mouth every evening.) 45 tablet 3 11/20/2020 at 2100   cyanocobalamin (,VITAMIN B-12,) 1000 MCG/ML  injection Inject 1,000 mcg into the muscle every 30 (thirty) days.   Past Month at unk   docusate sodium (COLACE) 50 MG capsule Take 50 mg by mouth 2 (two) times daily as needed for mild constipation.   prn at prn   dorzolamide-timolol (COSOPT) 22.3-6.8 MG/ML ophthalmic solution Place 1 drop into both eyes 2 (two) times daily.   11/21/2020 at 1130   levothyroxine (SYNTHROID) 100 MCG tablet TAKE 1 TABLET BY MOUTH DAILY BEFORE BREAKFAST 90 tablet 3 11/20/2020 at am   loperamide (IMODIUM) 2 MG capsule Take 2 mg by mouth as needed for diarrhea or loose stools.   prn at prn   mirtazapine (REMERON) 15 MG tablet Take 1 tablet (15 mg total) by mouth at bedtime. 30 tablet 3 11/20/2020 at 2100   pantoprazole (PROTONIX) 40 MG tablet TAKE 1 TABLET(40 MG) BY MOUTH DAILY 90 tablet 3 11/21/2020 at 1130   polyethylene glycol (MIRALAX /  GLYCOLAX) packet Take 17 g by mouth daily as needed for moderate constipation.    prn at prn   pravastatin (PRAVACHOL) 40 MG tablet TAKE 1/2 TABLET(20 MG) BY MOUTH AT BEDTIME 45 tablet 3 11/20/2020 at 2100   simethicone (MYLICON) 096 MG chewable tablet Chew 125 mg by mouth every 6 (six) hours as needed for flatulence.   prn at prn   tamsulosin (FLOMAX) 0.4 MG CAPS capsule TAKE 1 CAPSULE BY MOUTH DAILY 90 capsule 3 11/20/2020 at am   traMADol (ULTRAM) 50 MG tablet Take 1 tablet (50 mg total) by mouth every 6 (six) hours as needed for moderate pain. 120 tablet 1 11/21/2020 at am   warfarin (COUMADIN) 1 MG tablet TAKE 1 TO 1 AND 1/2 TABLETS BY MOUTH DAILY AS DIRECTED BY COUMADIN CLINIC (Patient taking differently: Take 0.5-1 mg by mouth at bedtime. TAKE 1 TO 1 AND 1/2 TABLETS BY MOUTH DAILY AS DIRECTED BY COUMADIN CLINIC  0.5 mg everyday except for fri he gets 1 mg) 45 tablet 2 11/20/2020 at 2100   Wheat Dextrin (BENEFIBER DRINK MIX PO) Take 1 scoop by mouth daily.   prn at prn   acetaminophen (TYLENOL) 500 MG tablet Take 500 mg by mouth every 6 (six) hours as needed for moderate pain or headache. (Patient not taking: Reported on 11/21/2020)   Not Taking   furosemide (LASIX) 20 MG tablet Take 1 tablet (20 mg total) by mouth daily. (Patient not taking: Reported on 11/21/2020) 90 tablet 1 Not Taking   Scheduled:   amiodarone  100 mg Oral QPM   vitamin C  500 mg Oral Daily   Chlorhexidine Gluconate Cloth  6 each Topical Daily   cyanocobalamin  1,000 mcg Intramuscular Q30 days   dorzolamide-timolol  1 drop Both Eyes BID   feeding supplement  237 mL Oral TID BM   iron polysaccharides  150 mg Oral Daily   levothyroxine  100 mcg Oral QAC breakfast   mirtazapine  15 mg Oral QHS   multivitamin with minerals  1 tablet Oral Daily   pantoprazole  40 mg Oral Daily   pravastatin  20 mg Oral q1800   simethicone  80 mg Oral QID   tamsulosin  0.4 mg Oral Daily   Warfarin - Pharmacist Dosing Inpatient   Does not  apply q1600   Infusions:   heparin 1,100 Units/hr (11/28/20 1446)   PRN: acetaminophen **OR** acetaminophen, calcium carbonate, camphor-menthol **AND** hydrOXYzine, cholestyramine light, docusate sodium, docusate sodium, hydrALAZINE, loperamide, ondansetron **OR** ondansetron (ZOFRAN) IV, polyethylene glycol, sorbitol, traMADol, zolpidem  Anti-infectives (From admission, onward)    Start     Dose/Rate Route Frequency Ordered Stop   11/26/20 1700  cefTRIAXone (ROCEPHIN) 1 g in sodium chloride 0.9 % 100 mL IVPB        1 g 200 mL/hr over 30 Minutes Intravenous On call 11/25/20 1713 11/26/20 1516       Assessment: Pt found to have nonocclusive, subacute appearing thrombus limited the left axillary. Pharmacy consulted to start heparin infusion. Pt has a hx of afib and is currently on warfarin. INR is 1.5 (subtherapeutic). Plan to give warfarin 1mg  on 8/31. Pt will need to bridge with warfarin x 5 days and until INR therapeutic for > 24 hours.   Goal of Therapy:  INR 2-3 Heparin level 0.3-0.7 units/ml Monitor platelets by anticoagulation protocol: Yes   Plan:  9/2:  HL @ 0447 = 0.57 Will continue this pt on current rate and recheck HL on 9/3 with AM labs.   Natesha Hassey D, PharmD 11/29/2020,6:55 AM

## 2020-11-29 NOTE — Discharge Summary (Signed)
[facility] Discharge Summary   Patient: [name] [name] JWJ:[id number]  PCP: Owens Loffler, MD  Date of admission: 11/21/2020   Date of discharge:  11/29/2020     Discharge Diagnoses:  Principal Problem:   AKI (acute kidney injury) ([place]) Active Problems:   Hypothyroidism   Hyperlipidemia   Essential hypertension   GERD   Prostate cancer, in remission   PACEMAKER, PERMANENT   Long term (current) use of anticoagulants   Longstanding persistent atrial fibrillation ([name])   Chronic kidney disease (CKD), stage IV (severe) ([name])   Protein-calorie malnutrition, severe   Admitted From: Home Disposition:  SNF   Recommendations for Outpatient Follow-up:  PCP: in 1 wk Urology next week for voiding trial, continue Foley catheter for now Nephrology next week Follow up LABS/TEST: BMP to check renal functions   Contact information for after-discharge care     [place] [facility] Preferred SNF .   Service: Skilled Nursing Contact information: [place] [phone]                    Diet recommendation: Cardiac diet  Activity: The patient is advised to gradually reintroduce usual activities, as tolerated  Discharge Condition: stable  Code Status: DNR   History of present illness: As per the H and P dictated on admission Hospital Course:  Assessment & Plan: Principal Problem:   AKI (acute kidney injury) ([place]) Active Problems:   Hypothyroidism   Hyperlipidemia   Essential hypertension   GERD   Prostate cancer, in remission   PACEMAKER, PERMANENT   Long term (current) use of anticoagulants   Longstanding persistent atrial fibrillation ([name])   Chronic kidney disease (CKD), stage IV (severe) ([name])   Protein-calorie malnutrition, severe # AKI on CKD stage IIIb, Metabolic acidosis, improved, Multifactorial, prerenal versus  obstructive uropathy. Poor p.o. intake, recent hospitalizations. S/p normal saline 75 cc/h, continue Flomax 8/30 s/p OR with urology, cystoscopy was done and Foley catheter was inserted.  Urology recommended to continue Foley catheter for 7 days and follow-up as an outpatient.  Patient was seen by nephrology as well, creatinine is gradually improving, nephrology recommended that patient can be discharged and follow-up as an outpatient.  Repeat BMP next week.  # Toxic encephalopathy after cystoscopy, patient developed delirium and visual hallucinations. Encephalopathy resolved. Patient is AO x3 today, slightly confused or forgetful. off mittens for 24 hours.  # Paroxysmal atrial fibrillation, Currently controlled on amiodarone Was therapeutic on warfarin, Coumadin  held in anticipation of surgical procedure, resumed on 8/31. INR 1.9 today.  Continue Coumadin and check INR dose accordingly. # Left arm swelling due to DVT, Venous duplex: Nonocclusive, subacute appearing thrombus limited the left axillary vein. Pharmacy consulted to resume Coumadin and bridge with heparin IV gtt Left arm swelling is improving.  Continue Coumadin, it is close to therapeutic range.   # Bacteriuria, UTI ruled out.  Leukocytes on UA, No other clinical indications of infection. Suspect asymptomatic bacteriuria. No indication for antibiotics at this time # Essential hypertension, Lasix dc'd, pt was not taking it.  Blood pressure remains soft, continue to monitor, patient is not on any antihypertensive medication right now. # GERD, PPI # Hyperlipidemia, Pravastatin # Hypothyroidism, Synthroid # Iron deficiency, started oral iron supplement.  Vitamin B12 level is elevated, no need of supplements . Folate within normal range  Body mass index is 23.6 kg/m.  Nutrition Problem:  Severe Malnutrition Etiology: chronic illness, cancer and cancer related treatments (CKD stage 4) Nutrition Interventions: Interventions: Ensure Enlive  (each supplement provides 350kcal and 20 grams of protein), Magic cup, MVI, Liberalize Diet   Pain control  - [place] Controlled Substance Reporting System database was reviewed. - 5 day supply was provided. - Patient was instructed, not to drive, operate heavy machinery, perform activities at heights, swimming or participation in water activities or provide baby sitting services while on Pain, Sleep and Anxiety Medications; until his outpatient Physician has advised to do so again.  - Also recommended to not to take more than prescribed Pain, Sleep and Anxiety Medications.  Patient was seen by physical therapy, who recommended SNF, which was arranged. On the day of the discharge the patient's vitals were stable, and no other acute medical condition were reported by patient. the patient was felt safe to be discharge at [facility] .  Consultants: Urology, nephrology Procedures: s/p cystoscopy and Foley catheter insertion  Discharge Exam: General: Appear in no distress, no Rash; Oral Mucosa Clear, moist. Cardiovascular: S1 and S2 Present, no Murmur, Respiratory: normal respiratory effort, Bilateral Air entry present and no Crackles, no wheezes Abdomen: Bowel Sound present, Soft and no tenderness, no hernia Extremities: no Pedal edema, no calf tenderness Neurology: alert and oriented to time, place, and person affect appropriate.  Filed Weights   11/28/20 0500 11/28/20 2103 11/29/20 0500  Weight: 67.8 kg 70.6 kg 70.4 kg   Vitals:   11/28/20 2103 11/29/20 0730  BP: (!) 108/59 98/69  Pulse: (!) 59 74  Resp:  20  Temp: 98.3 F (36.8 C) (!) 97.4 F (36.3 C)  SpO2: 100% 96%    DISCHARGE MEDICATION: Allergies as of 11/29/2020       Reactions   Penicillins Rash, Other (See Comments)   Has patient had a PCN reaction causing immediate rash, facial/tongue/throat swelling, SOB or lightheadedness with hypotension: {no Has patient had a PCN reaction causing severe rash involving mucus  membranes or skin necrosis: {no Has patient had a PCN reaction that required hospitalization {no Has patient had a PCN reaction occurring within the last 10 years: {no If all of the above answers are "NO", then may proceed with Cephalosporin use.        Medication List     STOP taking these medications    furosemide 20 MG tablet Commonly known as: LASIX       TAKE these medications    acetaminophen 500 MG tablet Commonly known as: TYLENOL Take 500 mg by mouth every 6 (six) hours as needed for moderate pain or headache.   alendronate 70 MG tablet Commonly known as: FOSAMAX TAKE 1 TABLET BY MOUTH EVERY 7 DAYS WITH A FULL GLASS OF WATER AND ON AN EMPTY STOMACH What changed:  how much to take how to take this when to take this   amiodarone 200 MG tablet Commonly known as: PACERONE TAKE 1/2 TABLET(100 MG) BY MOUTH DAILY What changed: See the new instructions.   ascorbic acid 500 MG tablet Commonly known as: VITAMIN C Take 1 tablet (500 mg total) by mouth daily. Start taking on: November 30, 2020   BENEFIBER DRINK MIX PO Take 1 scoop by mouth daily.   cyanocobalamin 1000 MCG/ML injection Commonly known as: (VITAMIN B-12) Inject 1,000 mcg into the muscle every 30 (thirty) days.   docusate sodium 50 MG capsule Commonly known as: COLACE Take 50 mg by mouth 2 (two) times daily as needed for mild constipation.  dorzolamide-timolol 22.3-6.8 MG/ML ophthalmic solution Commonly known as: COSOPT Place 1 drop into both eyes 2 (two) times daily.   iron polysaccharides 150 MG capsule Commonly known as: NIFEREX Take 1 capsule (150 mg total) by mouth daily. Start taking on: November 30, 2020   levothyroxine 100 MCG tablet Commonly known as: SYNTHROID TAKE 1 TABLET BY MOUTH DAILY BEFORE BREAKFAST   loperamide 2 MG capsule Commonly known as: IMODIUM Take 2 mg by mouth as needed for diarrhea or loose stools.   mirtazapine 15 MG tablet Commonly known as: REMERON Take  1 tablet (15 mg total) by mouth at bedtime.   pantoprazole 40 MG tablet Commonly known as: PROTONIX TAKE 1 TABLET(40 MG) BY MOUTH DAILY   polyethylene glycol 17 g packet Commonly known as: MIRALAX / GLYCOLAX Take 17 g by mouth daily as needed for moderate constipation.   pravastatin 40 MG tablet Commonly known as: PRAVACHOL TAKE 1/2 TABLET(20 MG) BY MOUTH AT BEDTIME   simethicone 125 MG chewable tablet Commonly known as: MYLICON Chew 443 mg by mouth every 6 (six) hours as needed for flatulence.   tamsulosin 0.4 MG Caps capsule Commonly known as: FLOMAX TAKE 1 CAPSULE BY MOUTH DAILY   traMADol 50 MG tablet Commonly known as: ULTRAM Take 1 tablet (50 mg total) by mouth every 6 (six) hours as needed for moderate pain.   warfarin 1 MG tablet Commonly known as: COUMADIN Take as directed. If you are unsure how to take this medication, talk to your nurse or doctor. Original instructions: TAKE 1 TO 1 AND 1/2 TABLETS BY MOUTH DAILY AS DIRECTED BY COUMADIN CLINIC What changed: See the new instructions.               Discharge Care Instructions  (From admission, onward)           Start     Ordered   11/29/20 0000  Discharge wound care:       Comments: Continue wound care and frequent turning   11/29/20 1323           Allergies  Allergen Reactions   Penicillins Rash and Other (See Comments)    Has patient had a PCN reaction causing immediate rash, facial/tongue/throat swelling, SOB or lightheadedness with hypotension: {no Has patient had a PCN reaction causing severe rash involving mucus membranes or skin necrosis: {no Has patient had a PCN reaction that required hospitalization {no Has patient had a PCN reaction occurring within the last 10 years: {no If all of the above answers are "NO", then may proceed with Cephalosporin use.   Discharge Instructions     Call MD for:  difficulty breathing, headache or visual disturbances   Complete by: As directed    Call  MD for:  extreme fatigue   Complete by: As directed    Call MD for:  persistant dizziness or light-headedness   Complete by: As directed    Call MD for:  persistant nausea and vomiting   Complete by: As directed    Call MD for:  severe uncontrolled pain   Complete by: As directed    Call MD for:  temperature >100.4   Complete by: As directed    Diet - low sodium heart healthy   Complete by: As directed    Discharge instructions   Complete by: As directed    Follow-up with PCP in 1 week, patient should be seen by an MD in few days. Repeat BMP next week to check creatinine level Follow  with urology next week for voiding trial, keep Foley catheter for total 7 days Follow nephrologist as an outpatient   Discharge wound care:   Complete by: As directed    Continue wound care and frequent turning   Increase activity slowly   Complete by: As directed        The results of significant diagnostics from this hospitalization (including imaging, microbiology, ancillary and laboratory) are listed below for reference.    Significant Diagnostic Studies: DG Chest 2 View  Result Date: 11/21/2020 CLINICAL DATA:  Weakness, left rib pain after fall several weeks ago EXAM: CHEST - 2 VIEW COMPARISON:  02/14/2019 FINDINGS: Frontal and lateral views of the chest are obtained. Dual lead pacer is again noted and stable. The cardiac silhouette is unremarkable. There is chronic elevation of the left hemidiaphragm. Trace left pleural fluid. No airspace disease or pneumothorax. There are displaced left lateral seventh and eighth rib fractures noted. No other acute bony abnormalities. IMPRESSION: 1. Minimally displaced left lateral seventh and eighth rib fractures. 2. Trace left pleural effusion. Electronically Signed   By: Randa Ngo M.D.   On: 11/21/2020 16:02   CT HEAD WO CONTRAST (5MM)  Result Date: 11/05/2020 CLINICAL DATA:  Facial trauma EXAM: CT HEAD WITHOUT CONTRAST CT CERVICAL SPINE WITHOUT CONTRAST  TECHNIQUE: Multidetector CT imaging of the head and cervical spine was performed following the standard protocol without intravenous contrast. Multiplanar CT image reconstructions of the cervical spine were also generated. COMPARISON:  CT head Aug 19, 2005. CTA neck 11/18/2004. Cervical radiographs June 07, 2013. FINDINGS: CT HEAD FINDINGS Brain: No evidence of acute infarction, hemorrhage, hydrocephalus, extra-axial collection or mass lesion/mass effect. Mild for age scattered white matter hypoattenuation, nonspecific but likely related to chronic microvascular ischemic disease. Mild for age atrophy with ex vacuo ventricular dilation. Vascular: No hyperdense vessel identified. Calcific intracranial atherosclerosis. Skull: No acute fracture. No acute findings in the visualized orbits. Sinuses/Orbits: Mild paranasal sinus mucosal thickening without visualized air-fluid levels. Other: Small left mastoid effusion. CT CERVICAL SPINE FINDINGS Alignment: There is reversal of the normal cervical lordosis with kyphosis centered in the mid to lower cervical spine, similar to the priors. Approximately 3 mm of anterolisthesis of C3 on C4 and C4 on C5, increased from the remote priors and most likely degenerative given severe facet arthropathy at these levels. Broad levocurvature. Skull base and vertebrae: No evidence of acute fracture. Chronic degenerative remodeling of multiple vertebral bodies. Osteopenia. Soft tissues and spinal canal: No prevertebral fluid or swelling. No visible canal hematoma. Disc levels: Multilevel degenerative disease, most pronounced and severe at C5-C6 and C6-C7. Moderate degenerative disease at other levels. Severe facet arthropathy on the right at C3-C4 and C4-C5. Multilevel neural foraminal stenosis, potentially severe on the right at C3-C4. Craniocervical degenerative change narrowing of the atlantodental interval Upper chest: Biapical pleuroparenchymal scarring. Small left apical pneumothorax.  Other: Carotid bifurcation atherosclerosis. IMPRESSION: CT head: 1. No evidence of acute intracranial abnormality. 2. Mild for age chronic microvascular ischemic disease and atrophy. 3. Small left mastoid effusion. CT cervical spine: 1. Small left apical pneumothorax, partially imaged. Recommend dedicated chest imaging. 2. No evidence of acute fracture. 3. Chronic reversal of the normal cervical lordosis with approximately 3 mm of anterolisthesis of C3 on C4 and C4 on C5, increased from the remote priors and most likely degenerative given severe facet arthropathy at these levels. 4. Multilevel severe degenerative disc disease and facet arthropathy with potentially severe foraminal stenosis on the right at C3-C4. MRI of  the cervical spine could better characterize the canal/foramina if clinically indicated. These results will be called to the ordering clinician or representative by the Radiologist Assistant, and communication documented in the PACS or [employer]. Electronically Signed   By: Margaretha Sheffield MD   On: 11/05/2020 13:48   CT Cervical Spine Wo Contrast  Result Date: 11/05/2020 CLINICAL DATA:  Facial trauma EXAM: CT HEAD WITHOUT CONTRAST CT CERVICAL SPINE WITHOUT CONTRAST TECHNIQUE: Multidetector CT imaging of the head and cervical spine was performed following the standard protocol without intravenous contrast. Multiplanar CT image reconstructions of the cervical spine were also generated. COMPARISON:  CT head Aug 19, 2005. CTA neck 11/18/2004. Cervical radiographs June 07, 2013. FINDINGS: CT HEAD FINDINGS Brain: No evidence of acute infarction, hemorrhage, hydrocephalus, extra-axial collection or mass lesion/mass effect. Mild for age scattered white matter hypoattenuation, nonspecific but likely related to chronic microvascular ischemic disease. Mild for age atrophy with ex vacuo ventricular dilation. Vascular: No hyperdense vessel identified. Calcific intracranial atherosclerosis. Skull: No  acute fracture. No acute findings in the visualized orbits. Sinuses/Orbits: Mild paranasal sinus mucosal thickening without visualized air-fluid levels. Other: Small left mastoid effusion. CT CERVICAL SPINE FINDINGS Alignment: There is reversal of the normal cervical lordosis with kyphosis centered in the mid to lower cervical spine, similar to the priors. Approximately 3 mm of anterolisthesis of C3 on C4 and C4 on C5, increased from the remote priors and most likely degenerative given severe facet arthropathy at these levels. Broad levocurvature. Skull base and vertebrae: No evidence of acute fracture. Chronic degenerative remodeling of multiple vertebral bodies. Osteopenia. Soft tissues and spinal canal: No prevertebral fluid or swelling. No visible canal hematoma. Disc levels: Multilevel degenerative disease, most pronounced and severe at C5-C6 and C6-C7. Moderate degenerative disease at other levels. Severe facet arthropathy on the right at C3-C4 and C4-C5. Multilevel neural foraminal stenosis, potentially severe on the right at C3-C4. Craniocervical degenerative change narrowing of the atlantodental interval Upper chest: Biapical pleuroparenchymal scarring. Small left apical pneumothorax. Other: Carotid bifurcation atherosclerosis. IMPRESSION: CT head: 1. No evidence of acute intracranial abnormality. 2. Mild for age chronic microvascular ischemic disease and atrophy. 3. Small left mastoid effusion. CT cervical spine: 1. Small left apical pneumothorax, partially imaged. Recommend dedicated chest imaging. 2. No evidence of acute fracture. 3. Chronic reversal of the normal cervical lordosis with approximately 3 mm of anterolisthesis of C3 on C4 and C4 on C5, increased from the remote priors and most likely degenerative given severe facet arthropathy at these levels. 4. Multilevel severe degenerative disc disease and facet arthropathy with potentially severe foraminal stenosis on the right at C3-C4. MRI of the  cervical spine could better characterize the canal/foramina if clinically indicated. These results will be called to the ordering clinician or representative by the Radiologist Assistant, and communication documented in the PACS or [employer]. Electronically Signed   By: Margaretha Sheffield MD   On: 11/05/2020 13:48   US Renal  Result Date: 11/21/2020 CLINICAL DATA:  Renal failure EXAM: RENAL / URINARY TRACT ULTRASOUND COMPLETE COMPARISON:  CT abdomen and pelvis 12/10/2016 FINDINGS: Right Kidney: Renal measurements: 8.6 x 4.7 x 4.6 cm = volume: 96 mL. Small circumscribed cysts are demonstrated in the upper pole measuring up to 1.3 cm maximal diameter. No significant parenchymal thinning or hydronephrosis. Left Kidney: Renal measurements: 9.3 x 5 x 5.1 cm = volume: 123 mL. 2.4 cm diameter cyst in the midpole was also present on prior CT. No significant parenchymal thinning or hydronephrosis. Bladder:  There is heterogeneous bladder wall thickening with trabeculation likely indicating chronic outflow obstruction. Echogenic structure in the base of the bladder corresponds to abnormality seen at prior CT. There are surgical clips in the area suggesting this could be postoperative. Alternatively, a chronic calcified stone or polypoid structure could have this appearance. Mild diffuse internal echoes within the bladder suggesting debris. This could represent concentrated urine, hemorrhage, or infection. Correlate with urinalysis. A chronic finding of the echogenic process suggest benign etiology but if stone is suspected, cystoscopy may be useful in further evaluation. Other: None. IMPRESSION: 1. No evidence of hydronephrosis in either kidney. 2. Small bilateral renal cysts. 3. Bladder wall thickening with trabeculation suggesting chronic outflow obstruction. Nonspecific large echogenic structure demonstrated in the base of the bladder which was present on prior CT from 12/10/2016. Diffuse internal echoes in the  urine. Correlate with urinalysis. Consider urology consultation. Electronically Signed   By: Lucienne Capers M.D.   On: 11/21/2020 17:55   US Venous Img Upper Uni Left (DVT)  Result Date: 11/27/2020 CLINICAL DATA:  87 year old male with left arm swelling and redness. EXAM: UPPER EXTREMITY VENOUS DOPPLER ULTRASOUND TECHNIQUE: Gray-scale sonography with graded compression, as well as color Doppler and duplex ultrasound were performed to evaluate the upper extremity deep venous system from the level of the subclavian vein and including the jugular, axillary, basilic, radial, ulnar and upper cephalic vein. Spectral Doppler was utilized to evaluate flow at rest and with distal augmentation maneuvers. COMPARISON:  None. FINDINGS: Contralateral Subclavian Vein: Respiratory phasicity is normal and symmetric with the symptomatic side. No evidence of thrombus. Normal compressibility. Internal Jugular Vein: No evidence of thrombus. Normal compressibility, respiratory phasicity and response to augmentation. Subclavian Vein: No evidence of thrombus. Normal compressibility, respiratory phasicity and response to augmentation. Axillary Vein: Nonocclusive, subacute appearing thrombus is present. Cephalic Vein: No evidence of thrombus. Normal compressibility, respiratory phasicity and response to augmentation. Basilic Vein: No evidence of thrombus. Normal compressibility, respiratory phasicity and response to augmentation. Brachial Veins: No evidence of thrombus. Normal compressibility, respiratory phasicity and response to augmentation. Radial Veins: No evidence of thrombus. Normal compressibility, respiratory phasicity and response to augmentation. Ulnar Veins: No evidence of thrombus. Normal compressibility, respiratory phasicity and response to augmentation. Other Findings: Subcutaneous edema is noted in the right upper extremity. IMPRESSION: Nonocclusive, subacute appearing thrombus limited the left axillary vein. Ruthann Cancer, MD [facility] Electronically Signed   By: Ruthann Cancer M.D.   On: 11/27/2020 12:23   CT T-SPINE NO CHARGE  Result Date: 11/05/2020 CLINICAL DATA:  Fall W19.XXXA (ICD-10-CM) EXAM: CT THORACIC SPINE WITHOUT CONTRAST TECHNIQUE: Multidetector CT images of the thoracic were obtained using the standard protocol without intravenous contrast. COMPARISON:  None. FINDINGS: Alignment: Broad dextrocurvature of the thoracic spine. No significant sagittal subluxation. Vertebrae: Vertebral body heights are maintained. No evidence of acute fracture. Diffuse osteopenia. Paraspinal and other soft tissues: Please see concurrent CT chest/abdomen/pelvis for intrathoracic evaluation. Disc levels: Mild-to-moderate multilevel degenerative change. IMPRESSION: 1. No evidence of acute fracture or traumatic malalignment. 2. Broad dextrocurvature of the thoracic spine. 3. Diffuse osteopenia. 4. Please see concurrent CT chest/abdomen/pelvis for intrathoracic evaluation. Electronically Signed   By: Margaretha Sheffield MD   On: 11/05/2020 16:13   CT L-SPINE NO CHARGE  Result Date: 11/05/2020 CLINICAL DATA:  Fall W19.XXXA (ICD-10-CM) EXAM: CT LUMBAR SPINE WITHOUT CONTRAST TECHNIQUE: Multidetector CT imaging of the lumbar spine was performed without intravenous contrast administration. Multiplanar CT image reconstructions were also generated.  COMPARISON:  CT abdomen/pelvis 12/10/2016. FINDINGS: Segmentation: 5 non rib-bearing lumbar type vertebral bodies. Alignment: Levocurvature, centered at L3-L4. Grade 1 anterolisthesis of L4 on L5, similar to the prior. Otherwise, no substantial sagittal subluxation. Vertebrae: Vertebral body heights appears similar to the prior. No evidence of acute fracture. Osteopenia. Paraspinal and other soft tissues: Please see concurrent CT chest/abdomen/pelvis for intrathoracic and intra-abdominal evaluation. Disc levels: Severe degenerative disease  at L4-L5, centric to the right where there is disc height loss, vacuum disc phenomenon and endplate sclerosis/cystic change. There is also severe lower lumbar facet arthropathy. Multilevel disc bulges with likely at least moderate canal stenosis at L4-L5 and potentially significant canal stenosis at L3-L4 IMPRESSION: 1. No evidence of acute fracture or traumatic malalignment. 2. Severe right eccentric degenerative disc disease at L4-L5 with grade 1 anterolisthesis disc bulge and suspected at least moderate canal stenosis and right foraminal stenosis. Potentially significant canal stenosis at L3-L4 as well. MRI of the lumbar spine could better characterize the canal and foramina if clinically indicated. 3. Lumbar levocurvature. 4. Diffuse osteopenia. 5. Please see concurrent CT chest/abdomen/pelvis for intrathoracic and intra-abdominal evaluation. Electronically Signed   By: Margaretha Sheffield MD   On: 11/05/2020 16:04   DG OR UROLOGY CYSTO IMAGE ([employer])  Result Date: 11/05/2020 There is no interpretation for this exam.  This order is for images obtained during a surgical procedure.  Please See "Surgeries" Tab for more information regarding the procedure.   CT CHEST ABDOMEN PELVIS WO CONTRAST  Result Date: 11/05/2020 CLINICAL DATA:  Multiple falls in past several days, chronic anemia, on chronic LEFT side abdominal pain EXAM: CT CHEST, ABDOMEN AND PELVIS WITHOUT CONTRAST TECHNIQUE: Multidetector CT imaging of the chest, abdomen and pelvis was performed following the standard protocol without IV contrast. Sagittal and coronal MPR images reconstructed from axial data set. No oral contrast. COMPARISON:  CT abdomen and pelvis 12/10/2016, CT chest 04/19/2012 FINDINGS: CT CHEST FINDINGS Cardiovascular: Atherosclerotic calcifications aorta and coronary arteries. Aorta normal caliber. Heart enlarged. Pacemaker leads RIGHT atrium and RIGHT ventricle. No pericardial effusion. Mediastinum/Nodes: Scattered gas in  esophagus. Base of cervical region normal appearance. No thoracic adenopathy. Lungs/Pleura: Small LEFT pleural effusion. Lungs clear. Tiny apical and anterior pneumothorax. Mild compressive atelectasis LEFT lower lobe. Musculoskeletal: Osseous demineralization with thoracic scoliosis. Numerous LEFT-sided rib fractures, including anterolateral LEFT fifth sixth seventh eighth and ninth ribs as well as the posterior LEFT ninth, tenth, and eleventh ribs. No RIGHT-sided rib fractures identified. CT ABDOMEN PELVIS FINDINGS Hepatobiliary: Gallbladder and liver normal appearance Pancreas: Atrophic pancreas without mass Spleen: Normal appearance Adrenals/Urinary Tract: Adrenal glands normal appearance. RIGHT kidney unremarkable. LEFT renal cyst 2.3 x 2.2 cm. No renal calculi, hydronephrosis, hydroureter, or bladder abnormality. Stomach/Bowel: Extensive distal colonic diverticulosis without definite evidence of diverticulitis. Appendix not visualized. Pelvic bowel loops are poorly assessed due to lack of IV and oral contrast. Stomach decompressed with evidence of prior surgery at proximal stomach. Vascular/Lymphatic: Atherosclerotic calcifications aorta and iliac arteries without aneurysm. Numerous pelvic phleboliths. No definite adenopathy. Reproductive: Prior prostate surgery. Large soft tissue calcification at anteriorly at superior bladder. Other: No definite free air or free fluid.  No hernia. Musculoskeletal: LEFT hip prosthesis. Degenerative changes of the lumbar spine. IMPRESSION: Numerous LEFT-sided rib fractures including the anterolateral LEFT fifth sixth seventh eighth and ninth ribs as well as the posterior LEFT ninth, tenth, and eleventh ribs. Small LEFT pleural effusion and minimal LEFT pneumothorax. Extensive distal colonic diverticulosis without evidence of diverticulitis. No acute intra-abdominal or intrapelvic abnormalities.  Aortic Atherosclerosis (ICD10-I70.0). Findings called to Dr. Archie Balboa on 11/05/2020  at 1645 hours. Electronically Signed   By: Lavonia Dana M.D.   On: 11/05/2020 16:46    Microbiology: Recent Results (from the past 240 hour(s))  Resp Panel by RT-PCR (Flu A&B, Covid) Nasopharyngeal Swab     Status: None   Collection Time: 11/21/20  3:59 PM   Specimen: Nasopharyngeal Swab; Nasopharyngeal(NP) swabs in vial transport medium  Result Value Ref Range Status   SARS Coronavirus 2 by RT PCR NEGATIVE NEGATIVE Final    Comment: (NOTE) SARS-CoV-2 target nucleic acids are NOT DETECTED.  The SARS-CoV-2 RNA is generally detectable in upper respiratory specimens during the acute phase of infection. The lowest concentration of SARS-CoV-2 viral copies this assay can detect is 138 copies/mL. A negative result does not preclude SARS-Cov-2 infection and should not be used as the sole basis for treatment or other patient management decisions. A negative result may occur with  improper specimen collection/handling, submission of specimen other than nasopharyngeal swab, presence of viral mutation(s) within the areas targeted by this assay, and inadequate number of viral copies(<138 copies/mL). A negative result must be combined with clinical observations, patient history, and epidemiological information. The expected result is Negative.  Fact Sheet for Patients:  EntrepreneurPulse.com.au  Fact Sheet for Healthcare Providers:  IncredibleEmployment.be  This test is no t yet approved or cleared by the [place] FDA and  has been authorized for detection and/or diagnosis of SARS-CoV-2 by FDA under an Emergency Use Authorization (EUA). This EUA will remain  in effect (meaning this test can be used) for the duration of the COVID-19 declaration under Section 564(b)(1) of the Act, 21 U.S.C.section 360bbb-3(b)(1), unless the authorization is terminated  or revoked sooner.       Influenza A by PCR NEGATIVE NEGATIVE Final   Influenza B by PCR NEGATIVE  NEGATIVE Final    Comment: (NOTE) The Xpert Xpress SARS-CoV-2/FLU/RSV plus assay is intended as an aid in the diagnosis of influenza from Nasopharyngeal swab specimens and should not be used as a sole basis for treatment. Nasal washings and aspirates are unacceptable for Xpert Xpress SARS-CoV-2/FLU/RSV testing.  Fact Sheet for Patients: EntrepreneurPulse.com.au  Fact Sheet for Healthcare Providers: IncredibleEmployment.be  This test is not yet approved or cleared by the [place] FDA and has been authorized for detection and/or diagnosis of SARS-CoV-2 by FDA under an Emergency Use Authorization (EUA). This EUA will remain in effect (meaning this test can be used) for the duration of the COVID-19 declaration under Section 564(b)(1) of the Act, 21 U.S.C. section 360bbb-3(b)(1), unless the authorization is terminated or revoked.  Performed at [facility], [place], [place] [address]   C Difficile Quick Screen w PCR reflex     Status: None   Collection Time: 11/23/20  4:45 PM   Specimen: STOOL  Result Value Ref Range Status   C Diff antigen NEGATIVE NEGATIVE Final   C Diff toxin NEGATIVE NEGATIVE Final   C Diff interpretation No C. difficile detected.  Final    Comment: Performed at [facility], [address] [place], [place] [address]  Gastrointestinal Panel by PCR , Stool     Status: None   Collection Time: 11/23/20  4:45 PM   Specimen: Stool  Result Value Ref Range Status   Campylobacter species NOT DETECTED NOT DETECTED Final   Plesimonas shigelloides NOT DETECTED NOT DETECTED Final   Salmonella species NOT DETECTED NOT DETECTED Final   Yersinia  enterocolitica NOT DETECTED NOT DETECTED Final   Vibrio species NOT DETECTED NOT DETECTED Final   Vibrio cholerae NOT DETECTED NOT DETECTED Final   Enteroaggregative E coli (EAEC) NOT DETECTED NOT DETECTED Final   Enteropathogenic E coli (EPEC) NOT  DETECTED NOT DETECTED Final   Enterotoxigenic E coli (ETEC) NOT DETECTED NOT DETECTED Final   Shiga like toxin producing E coli (STEC) NOT DETECTED NOT DETECTED Final   Shigella/Enteroinvasive E coli (EIEC) NOT DETECTED NOT DETECTED Final   Cryptosporidium NOT DETECTED NOT DETECTED Final   Cyclospora cayetanensis NOT DETECTED NOT DETECTED Final   Entamoeba histolytica NOT DETECTED NOT DETECTED Final   Giardia lamblia NOT DETECTED NOT DETECTED Final   Adenovirus F40/41 NOT DETECTED NOT DETECTED Final   Astrovirus NOT DETECTED NOT DETECTED Final   Norovirus GI/GII NOT DETECTED NOT DETECTED Final   Rotavirus A NOT DETECTED NOT DETECTED Final   Sapovirus (I, II, IV, and V) NOT DETECTED NOT DETECTED Final    Comment: Performed at [facility], [address] [place], [place] [address]  Urine Culture     Status: Abnormal   Collection Time: 11/26/20  3:52 PM   Specimen: Urine, Catheterized  Result Value Ref Range Status   Specimen Description   Final    URINE, CATHETERIZED Performed at [facility], [address] [place], [place] [address]    Special Requests   Final    NONE Performed at [facility], [address] [place], [place] [address]    Culture (A)  Final    70,000 COLONIES/mL KLEBSIELLA PNEUMONIAE 70,000 COLONIES/mL ENTEROCOCCUS FAECALIS    Report Status 11/29/2020 FINAL  Final   Organism ID, Bacteria KLEBSIELLA PNEUMONIAE (A)  Final   Organism ID, Bacteria ENTEROCOCCUS FAECALIS (A)  Final      Susceptibility   Enterococcus faecalis - MIC*    AMPICILLIN <=2 SENSITIVE Sensitive     NITROFURANTOIN <=16 SENSITIVE Sensitive     VANCOMYCIN 2 SENSITIVE Sensitive     * 70,000 COLONIES/mL ENTEROCOCCUS FAECALIS   Klebsiella pneumoniae - MIC*    AMPICILLIN >=32 RESISTANT Resistant     CEFAZOLIN <=4 SENSITIVE Sensitive     CEFEPIME <=0.12 SENSITIVE Sensitive     CEFTRIAXONE <=0.25 SENSITIVE Sensitive     CIPROFLOXACIN <=0.25 SENSITIVE Sensitive      GENTAMICIN <=1 SENSITIVE Sensitive     IMIPENEM <=0.25 SENSITIVE Sensitive     NITROFURANTOIN 32 SENSITIVE Sensitive     TRIMETH/SULFA <=20 SENSITIVE Sensitive     AMPICILLIN/SULBACTAM 4 SENSITIVE Sensitive     PIP/TAZO 8 SENSITIVE Sensitive     * 70,000 COLONIES/mL KLEBSIELLA PNEUMONIAE  SARS CORONAVIRUS 2 (TAT 6-24 HRS) Nasopharyngeal     Status: None   Collection Time: 11/28/20  2:38 PM   Specimen: Nasopharyngeal  Result Value Ref Range Status   SARS Coronavirus 2 NEGATIVE NEGATIVE Final    Comment: (NOTE) SARS-CoV-2 target nucleic acids are NOT DETECTED.  The SARS-CoV-2 RNA is generally detectable in upper and lower respiratory specimens during the acute phase of infection. Negative results do not preclude SARS-CoV-2 infection, do not rule out co-infections with other pathogens, and should not be used as the sole basis for treatment or other patient management decisions. Negative results must be combined with clinical observations, patient history, and epidemiological information. The expected result is Negative.  Fact Sheet for Patients: SugarRoll.be  Fact Sheet for Healthcare Providers: https://www.woods-mathews.com/  This test is not yet approved or cleared by the [place] FDA and  has been authorized for detection and/or diagnosis of SARS-CoV-2 by FDA under an Emergency Use Authorization (EUA). This EUA will remain  in effect (meaning this test can be used) for the duration of the COVID-19 declaration under Se ction 564(b)(1) of the Act, 21 U.S.C. section 360bbb-3(b)(1), unless the authorization is terminated or revoked sooner.  Performed at [facility], [place] [address], [place], [place] [address]      Labs: CBC: Recent Labs  Lab 11/25/20 0404 11/26/20 0423 11/27/20 0709 11/28/20 0459 11/29/20 0447  WBC 6.4 7.3 9.3 10.7* 10.4  NEUTROABS 3.7 4.5 7.7 8.4* 7.2  HGB 10.1* 10.5* 10.2* 10.4* 10.2*  HCT  31.9* 34.1* 34.0* 34.5* 34.3*  MCV 99.7 104.0* 105.6* 107.1* 107.2*  PLT 201 196 201 203 626   Basic Metabolic Panel: Recent Labs  Lab 11/25/20 0404 11/26/20 0423 11/27/20 0709 11/28/20 0459 11/29/20 0447  NA 140 142 142 141 141  K 3.4* 4.3 4.6 4.7 4.2  CL 111 112* 115* 115* 116*  CO2 23 21* 21* 23 22  GLUCOSE 105* 95 104* 113* 116*  BUN 80* 75* 76* 73* 62*  CREATININE 4.66* 4.45* 4.26* 4.22* 3.99*  CALCIUM 8.1* 8.5* 8.5* 8.1* 8.1*  MG  --   --   --  2.0 2.0  PHOS  --   --   --  6.0* 5.0*   Liver Function Tests: No results for input(s): AST, ALT, ALKPHOS, BILITOT, PROT, ALBUMIN in the last 168 hours. No results for input(s): LIPASE, AMYLASE in the last 168 hours. No results for input(s): AMMONIA in the last 168 hours. Cardiac Enzymes: No results for input(s): CKTOTAL, CKMB, CKMBINDEX, TROPONINI in the last 168 hours. BNP (last 3 results) No results for input(s): BNP in the last 8760 hours. CBG: No results for input(s): GLUCAP in the last 168 hours.  Time spent: 35 minutes  Signed:  Val Riles  [facility]  11/29/2020 1:24 PM

## 2020-11-29 NOTE — Progress Notes (Signed)
Report called to stephanie at [facility].  Patient awaiting transport to facility via EMS.

## 2020-11-29 NOTE — Progress Notes (Signed)
Patient discharged via ems, no acute distress noted. Family aware. Patient discharge with all pertinent information, prescriptions, and personal belongings.  Report previously called. IV d/ced.  Care relinquished.

## 2020-11-29 NOTE — Progress Notes (Signed)
[facility]  PROGRESS NOTE   Subjective:   Patient seen sitting up in bed Eating breakfast Daughter at bedside  Alert and oriented Denies nausea and vomiting Denies shortness of breath   Objective:  Vital signs in last 24 hours:  Temp:  [97.4 F (36.3 C)-98.3 F (36.8 C)] 97.4 F (36.3 C) (09/02 0730) Pulse Rate:  [59-74] 74 (09/02 0730) Resp:  [17-24] 20 (09/02 0730) BP: (98-115)/(59-72) 98/69 (09/02 0730) SpO2:  [96 %-100 %] 96 % (09/02 0730) Weight:  [70.4 kg-70.6 kg] 70.4 kg (09/02 0500)  Weight change: 2.8 kg Filed Weights   11/28/20 0500 11/28/20 2103 11/29/20 0500  Weight: 67.8 kg 70.6 kg 70.4 kg    Intake/Output: I/O last 3 completed shifts: In: 2281.5 [P.O.:600; I.V.:1681.5] Out: 870 [Urine:870]   Intake/Output this shift:  Total I/O In: 240 [P.O.:240] Out: -   Physical Exam: General:  No acute distress  Head:  Normocephalic, atraumatic. Moist oral mucosal membranes  Eyes:  Anicteric  Lungs:   Clear to auscultation, normal effort  Heart:  S1S2 no rubs  Abdomen:   Soft, nontender, bowel sounds present  Extremities:  No BLE peripheral edema, LUE edema, erythema.  Neurologic:  Alert and oriented  Skin:  No lesions       Basic Metabolic Panel: Recent Labs  Lab 11/25/20 0404 11/26/20 0423 11/27/20 0709 11/28/20 0459 11/29/20 0447  NA 140 142 142 141 141  K 3.4* 4.3 4.6 4.7 4.2  CL 111 112* 115* 115* 116*  CO2 23 21* 21* 23 22  GLUCOSE 105* 95 104* 113* 116*  BUN 80* 75* 76* 73* 62*  CREATININE 4.66* 4.45* 4.26* 4.22* 3.99*  CALCIUM 8.1* 8.5* 8.5* 8.1* 8.1*  MG  --   --   --  2.0 2.0  PHOS  --   --   --  6.0* 5.0*     CBC: Recent Labs  Lab 11/25/20 0404 11/26/20 0423 11/27/20 0709 11/28/20 0459 11/29/20 0447  WBC 6.4 7.3 9.3 10.7* 10.4  NEUTROABS 3.7 4.5 7.7 8.4* 7.2  HGB 10.1* 10.5* 10.2* 10.4* 10.2*  HCT 31.9* 34.1* 34.0* 34.5* 34.3*  MCV 99.7 104.0* 105.6* 107.1* 107.2*  PLT 201 196 201 203 195       Urinalysis: No results for input(s): COLORURINE, LABSPEC, PHURINE, GLUCOSEU, HGBUR, BILIRUBINUR, KETONESUR, PROTEINUR, UROBILINOGEN, NITRITE, LEUKOCYTESUR in the last 72 hours.  Invalid input(s): APPERANCEUR     Imaging: US Venous Img Upper Uni Left (DVT)  Result Date: 11/27/2020 CLINICAL DATA:  87 year old male with left arm swelling and redness. EXAM: UPPER EXTREMITY VENOUS DOPPLER ULTRASOUND TECHNIQUE: Gray-scale sonography with graded compression, as well as color Doppler and duplex ultrasound were performed to evaluate the upper extremity deep venous system from the level of the subclavian vein and including the jugular, axillary, basilic, radial, ulnar and upper cephalic vein. Spectral Doppler was utilized to evaluate flow at rest and with distal augmentation maneuvers. COMPARISON:  None. FINDINGS: Contralateral Subclavian Vein: Respiratory phasicity is normal and symmetric with the symptomatic side. No evidence of thrombus. Normal compressibility. Internal Jugular Vein: No evidence of thrombus. Normal compressibility, respiratory phasicity and response to augmentation. Subclavian Vein: No evidence of thrombus. Normal compressibility, respiratory phasicity and response to augmentation. Axillary Vein: Nonocclusive, subacute appearing thrombus is present. Cephalic Vein: No evidence of thrombus. Normal compressibility, respiratory phasicity and response to augmentation. Basilic Vein: No evidence of thrombus. Normal compressibility, respiratory phasicity and response to augmentation. Brachial Veins: No evidence of thrombus. Normal compressibility, respiratory phasicity  and response to augmentation. Radial Veins: No evidence of thrombus. Normal compressibility, respiratory phasicity and response to augmentation. Ulnar Veins: No evidence of thrombus. Normal compressibility, respiratory phasicity and response to augmentation. Other Findings: Subcutaneous edema is noted in the right upper  extremity. IMPRESSION: Nonocclusive, subacute appearing thrombus limited the left axillary vein. Ruthann Cancer, MD [facility] Electronically Signed   By: Ruthann Cancer M.D.   On: 11/27/2020 12:23     Medications:    heparin 1,100 Units/hr (11/28/20 1446)    amiodarone  100 mg Oral QPM   vitamin C  500 mg Oral Daily   Chlorhexidine Gluconate Cloth  6 each Topical Daily   cyanocobalamin  1,000 mcg Intramuscular Q30 days   dorzolamide-timolol  1 drop Both Eyes BID   feeding supplement  237 mL Oral TID BM   iron polysaccharides  150 mg Oral Daily   levothyroxine  100 mcg Oral QAC breakfast   mirtazapine  15 mg Oral QHS   multivitamin with minerals  1 tablet Oral Daily   pantoprazole  40 mg Oral Daily   pravastatin  20 mg Oral q1800   simethicone  80 mg Oral QID   tamsulosin  0.4 mg Oral Daily   warfarin  0.5 mg Oral ONCE-1600   Warfarin - Pharmacist Dosing Inpatient   Does not apply q1600    Assessment/ Plan:     Principal Problem:   AKI (acute kidney injury) ([place]) Active Problems:   Hypothyroidism   Hyperlipidemia   Essential hypertension   GERD   Prostate cancer, in remission   PACEMAKER, PERMANENT   Long term (current) use of anticoagulants   Longstanding persistent atrial fibrillation ([name])   Chronic kidney disease (CKD), stage IV (severe) ([name])   Protein-calorie malnutrition, severe  #1 acute kidney injury on  chronic kidney disease st 4.  Most likely due to obstructive disease.  Baseline creatinine 2.71 from 10/16/20. GFR 20 Patient underwent cystoscopy with urethral dilatation on November 26, 2020 Foley catheter in place since surgery, recommended placement for 7 days. Creatinine slightly improved. Encourage patient to continue oral nutrition. Will need to follow up with nephrology at discharge. Will continue to monitor while inpatient Lab Results  Component Value Date   CREATININE 3.99 (H) 11/29/2020    CREATININE 4.22 (H) 11/28/2020   CREATININE 4.26 (H) 11/27/2020      #2:  Mental status change Believed from procedure anesthesia  Mentation at baseline   #3: Hyperkalemia: Currently 4.2.   #4 left arm Edema [place] r/o DVT, negative for thrombus Elevated on pillows     LOS: 8 Colon Flattery, NP [facility] 9/2/202211:36 AM

## 2020-12-04 DIAGNOSIS — N179 Acute kidney failure, unspecified: Secondary | ICD-10-CM | POA: Diagnosis not present

## 2020-12-04 DIAGNOSIS — I5040 Unspecified combined systolic (congestive) and diastolic (congestive) heart failure: Secondary | ICD-10-CM | POA: Diagnosis not present

## 2020-12-04 DIAGNOSIS — N184 Chronic kidney disease, stage 4 (severe): Secondary | ICD-10-CM | POA: Diagnosis not present

## 2020-12-04 DIAGNOSIS — G9340 Encephalopathy, unspecified: Secondary | ICD-10-CM | POA: Diagnosis not present

## 2020-12-06 ENCOUNTER — Emergency Department: Payer: Medicare Other

## 2020-12-06 ENCOUNTER — Encounter: Payer: Self-pay | Admitting: Internal Medicine

## 2020-12-06 ENCOUNTER — Other Ambulatory Visit: Payer: Self-pay

## 2020-12-06 ENCOUNTER — Inpatient Hospital Stay
Admission: EM | Admit: 2020-12-06 | Discharge: 2020-12-28 | DRG: 951 | Disposition: E | Payer: Medicare Other | Source: Skilled Nursing Facility | Attending: Internal Medicine | Admitting: Internal Medicine

## 2020-12-06 DIAGNOSIS — Z515 Encounter for palliative care: Principal | ICD-10-CM

## 2020-12-06 DIAGNOSIS — Z79890 Hormone replacement therapy (postmenopausal): Secondary | ICD-10-CM

## 2020-12-06 DIAGNOSIS — G9340 Encephalopathy, unspecified: Secondary | ICD-10-CM | POA: Diagnosis not present

## 2020-12-06 DIAGNOSIS — Z950 Presence of cardiac pacemaker: Secondary | ICD-10-CM

## 2020-12-06 DIAGNOSIS — I129 Hypertensive chronic kidney disease with stage 1 through stage 4 chronic kidney disease, or unspecified chronic kidney disease: Secondary | ICD-10-CM | POA: Diagnosis present

## 2020-12-06 DIAGNOSIS — Z85828 Personal history of other malignant neoplasm of skin: Secondary | ICD-10-CM

## 2020-12-06 DIAGNOSIS — D539 Nutritional anemia, unspecified: Secondary | ICD-10-CM | POA: Diagnosis not present

## 2020-12-06 DIAGNOSIS — Z833 Family history of diabetes mellitus: Secondary | ICD-10-CM | POA: Diagnosis not present

## 2020-12-06 DIAGNOSIS — G9341 Metabolic encephalopathy: Secondary | ICD-10-CM | POA: Diagnosis present

## 2020-12-06 DIAGNOSIS — K5730 Diverticulosis of large intestine without perforation or abscess without bleeding: Secondary | ICD-10-CM | POA: Diagnosis present

## 2020-12-06 DIAGNOSIS — I4891 Unspecified atrial fibrillation: Secondary | ICD-10-CM | POA: Diagnosis present

## 2020-12-06 DIAGNOSIS — I700 Atherosclerosis of aorta: Secondary | ICD-10-CM | POA: Diagnosis present

## 2020-12-06 DIAGNOSIS — I4811 Longstanding persistent atrial fibrillation: Secondary | ICD-10-CM | POA: Diagnosis not present

## 2020-12-06 DIAGNOSIS — R410 Disorientation, unspecified: Secondary | ICD-10-CM

## 2020-12-06 DIAGNOSIS — R4182 Altered mental status, unspecified: Secondary | ICD-10-CM | POA: Diagnosis not present

## 2020-12-06 DIAGNOSIS — R6520 Severe sepsis without septic shock: Secondary | ICD-10-CM | POA: Diagnosis not present

## 2020-12-06 DIAGNOSIS — E039 Hypothyroidism, unspecified: Secondary | ICD-10-CM

## 2020-12-06 DIAGNOSIS — I10 Essential (primary) hypertension: Secondary | ICD-10-CM

## 2020-12-06 DIAGNOSIS — Z8249 Family history of ischemic heart disease and other diseases of the circulatory system: Secondary | ICD-10-CM

## 2020-12-06 DIAGNOSIS — Z923 Personal history of irradiation: Secondary | ICD-10-CM

## 2020-12-06 DIAGNOSIS — Z803 Family history of malignant neoplasm of breast: Secondary | ICD-10-CM | POA: Diagnosis not present

## 2020-12-06 DIAGNOSIS — N184 Chronic kidney disease, stage 4 (severe): Secondary | ICD-10-CM | POA: Diagnosis not present

## 2020-12-06 DIAGNOSIS — A419 Sepsis, unspecified organism: Secondary | ICD-10-CM | POA: Diagnosis not present

## 2020-12-06 DIAGNOSIS — Z7901 Long term (current) use of anticoagulants: Secondary | ICD-10-CM

## 2020-12-06 DIAGNOSIS — N179 Acute kidney failure, unspecified: Secondary | ICD-10-CM | POA: Diagnosis present

## 2020-12-06 DIAGNOSIS — J90 Pleural effusion, not elsewhere classified: Secondary | ICD-10-CM

## 2020-12-06 DIAGNOSIS — I480 Paroxysmal atrial fibrillation: Secondary | ICD-10-CM | POA: Diagnosis present

## 2020-12-06 DIAGNOSIS — I5040 Unspecified combined systolic (congestive) and diastolic (congestive) heart failure: Secondary | ICD-10-CM | POA: Diagnosis not present

## 2020-12-06 DIAGNOSIS — N185 Chronic kidney disease, stage 5: Secondary | ICD-10-CM | POA: Diagnosis not present

## 2020-12-06 DIAGNOSIS — Z66 Do not resuscitate: Secondary | ICD-10-CM | POA: Diagnosis present

## 2020-12-06 DIAGNOSIS — F32A Depression, unspecified: Secondary | ICD-10-CM | POA: Diagnosis present

## 2020-12-06 DIAGNOSIS — Z20822 Contact with and (suspected) exposure to covid-19: Secondary | ICD-10-CM | POA: Diagnosis present

## 2020-12-06 DIAGNOSIS — R6521 Severe sepsis with septic shock: Secondary | ICD-10-CM | POA: Diagnosis present

## 2020-12-06 DIAGNOSIS — Z79899 Other long term (current) drug therapy: Secondary | ICD-10-CM

## 2020-12-06 DIAGNOSIS — E785 Hyperlipidemia, unspecified: Secondary | ICD-10-CM

## 2020-12-06 DIAGNOSIS — Z7983 Long term (current) use of bisphosphonates: Secondary | ICD-10-CM

## 2020-12-06 DIAGNOSIS — K219 Gastro-esophageal reflux disease without esophagitis: Secondary | ICD-10-CM | POA: Diagnosis present

## 2020-12-06 DIAGNOSIS — T68XXXA Hypothermia, initial encounter: Secondary | ICD-10-CM | POA: Diagnosis present

## 2020-12-06 DIAGNOSIS — R4020 Unspecified coma: Secondary | ICD-10-CM | POA: Diagnosis present

## 2020-12-06 DIAGNOSIS — I517 Cardiomegaly: Secondary | ICD-10-CM | POA: Diagnosis not present

## 2020-12-06 DIAGNOSIS — Z8546 Personal history of malignant neoplasm of prostate: Secondary | ICD-10-CM

## 2020-12-06 DIAGNOSIS — J189 Pneumonia, unspecified organism: Secondary | ICD-10-CM

## 2020-12-06 DIAGNOSIS — Z880 Allergy status to penicillin: Secondary | ICD-10-CM

## 2020-12-06 DIAGNOSIS — Y95 Nosocomial condition: Secondary | ICD-10-CM | POA: Diagnosis present

## 2020-12-06 LAB — BASIC METABOLIC PANEL
Anion gap: 6 (ref 5–15)
BUN: 60 mg/dL — ABNORMAL HIGH (ref 8–23)
CO2: 20 mmol/L — ABNORMAL LOW (ref 22–32)
Calcium: 8.6 mg/dL — ABNORMAL LOW (ref 8.9–10.3)
Chloride: 113 mmol/L — ABNORMAL HIGH (ref 98–111)
Creatinine, Ser: 4.57 mg/dL — ABNORMAL HIGH (ref 0.61–1.24)
GFR, Estimated: 11 mL/min — ABNORMAL LOW (ref >60)
Glucose, Bld: 111 mg/dL — ABNORMAL HIGH (ref 70–99)
Potassium: 4.9 mmol/L (ref 3.5–5.1)
Sodium: 139 mmol/L (ref 135–145)

## 2020-12-06 LAB — CBC WITH DIFFERENTIAL/PLATELET
Abs Immature Granulocytes: 0.12 10*3/uL — ABNORMAL HIGH (ref 0.00–0.07)
Basophils Absolute: 0 10*3/uL (ref 0.0–0.1)
Basophils Relative: 0 %
Eosinophils Absolute: 0.1 10*3/uL (ref 0.0–0.5)
Eosinophils Relative: 1 %
HCT: 33.2 % — ABNORMAL LOW (ref 39.0–52.0)
Hemoglobin: 9.9 g/dL — ABNORMAL LOW (ref 13.0–17.0)
Immature Granulocytes: 2 %
Lymphocytes Relative: 11 %
Lymphs Abs: 0.8 10*3/uL (ref 0.7–4.0)
MCH: 32.5 pg (ref 26.0–34.0)
MCHC: 29.8 g/dL — ABNORMAL LOW (ref 30.0–36.0)
MCV: 108.9 fL — ABNORMAL HIGH (ref 80.0–100.0)
Monocytes Absolute: 0.6 10*3/uL (ref 0.1–1.0)
Monocytes Relative: 8 %
Neutro Abs: 5.5 10*3/uL (ref 1.7–7.7)
Neutrophils Relative %: 78 %
Platelets: 237 10*3/uL (ref 150–400)
RBC: 3.05 MIL/uL — ABNORMAL LOW (ref 4.22–5.81)
RDW: 15.9 % — ABNORMAL HIGH (ref 11.5–15.5)
WBC: 7.1 10*3/uL (ref 4.0–10.5)
nRBC: 0 % (ref 0.0–0.2)

## 2020-12-06 LAB — RESP PANEL BY RT-PCR (FLU A&B, COVID) ARPGX2
Influenza A by PCR: NEGATIVE
Influenza B by PCR: NEGATIVE
SARS Coronavirus 2 by RT PCR: NEGATIVE

## 2020-12-06 LAB — LACTIC ACID, PLASMA
Lactic Acid, Venous: 0.6 mmol/L (ref 0.5–1.9)
Lactic Acid, Venous: 0.5 mmol/L (ref 0.5–1.9)

## 2020-12-06 LAB — PROCALCITONIN: Procalcitonin: <0.1 ng/mL

## 2020-12-06 LAB — PROTIME-INR
INR: 3 — ABNORMAL HIGH (ref 0.8–1.2)
Prothrombin Time: 31.1 seconds — ABNORMAL HIGH (ref 11.4–15.2)

## 2020-12-06 MED ORDER — SODIUM CHLORIDE 0.9 % IV SOLN
2.0000 g | Freq: Once | INTRAVENOUS | Status: AC
  Administered 2020-12-06: 2 g via INTRAVENOUS
  Filled 2020-12-06: qty 2

## 2020-12-06 MED ORDER — NAPHAZOLINE-GLYCERIN 0.012-0.25 % OP SOLN
1.0000 [drp] | Freq: Four times a day (QID) | OPHTHALMIC | Status: DC | PRN
  Filled 2020-12-06: qty 15

## 2020-12-06 MED ORDER — SODIUM CHLORIDE 0.9 % IV SOLN: 1.0000 g | INTRAVENOUS | Status: DC

## 2020-12-06 MED ORDER — SIMETHICONE 80 MG PO CHEW
125.0000 mg | CHEWABLE_TABLET | Freq: Four times a day (QID) | ORAL | Status: DC | PRN
  Filled 2020-12-06: qty 2

## 2020-12-06 MED ORDER — WARFARIN - PHARMACIST DOSING INPATIENT
Freq: Every day | Status: DC
  Filled 2020-12-06: qty 1

## 2020-12-06 MED ORDER — PANTOPRAZOLE SODIUM 40 MG PO TBEC: 40.0000 mg | DELAYED_RELEASE_TABLET | Freq: Every day | ORAL | Status: DC

## 2020-12-06 MED ORDER — ACETAMINOPHEN 650 MG RE SUPP: 650.0000 mg | Freq: Four times a day (QID) | RECTAL | Status: DC | PRN

## 2020-12-06 MED ORDER — LEVOTHYROXINE SODIUM 50 MCG PO TABS: 100.0000 ug | ORAL_TABLET | Freq: Every day | ORAL | Status: DC

## 2020-12-06 MED ORDER — DOCUSATE SODIUM 100 MG PO CAPS: 100.0000 mg | ORAL_CAPSULE | Freq: Two times a day (BID) | ORAL | Status: DC | PRN

## 2020-12-06 MED ORDER — MIRTAZAPINE 15 MG PO TABS: 15.0000 mg | ORAL_TABLET | Freq: Every day | ORAL | Status: DC

## 2020-12-06 MED ORDER — LINEZOLID 600 MG/300ML IV SOLN
600.0000 mg | Freq: Two times a day (BID) | INTRAVENOUS | Status: DC
  Filled 2020-12-06 (×2): qty 300

## 2020-12-06 MED ORDER — LACTATED RINGERS IV SOLN: INTRAVENOUS | Status: DC

## 2020-12-06 MED ORDER — ACETAMINOPHEN 325 MG PO TABS
650.0000 mg | ORAL_TABLET | Freq: Four times a day (QID) | ORAL | Status: DC | PRN
Start: 2020-12-06 — End: 2020-12-06

## 2020-12-06 MED ORDER — VANCOMYCIN HCL IN DEXTROSE 1-5 GM/200ML-% IV SOLN
1000.0000 mg | Freq: Once | INTRAVENOUS | Status: AC
  Administered 2020-12-06: 1000 mg via INTRAVENOUS
  Filled 2020-12-06: qty 200

## 2020-12-06 MED ORDER — DM-GUAIFENESIN ER 30-600 MG PO TB12
1.0000 | ORAL_TABLET | Freq: Two times a day (BID) | ORAL | Status: DC | PRN
Start: 2020-12-06 — End: 2020-12-06

## 2020-12-06 MED ORDER — POLYSACCHARIDE IRON COMPLEX 150 MG PO CAPS: 150.0000 mg | ORAL_CAPSULE | Freq: Every day | ORAL | Status: DC

## 2020-12-06 MED ORDER — VANCOMYCIN HCL 500 MG/100ML IV SOLN
500.0000 mg | Freq: Once | INTRAVENOUS | Status: DC
  Filled 2020-12-06: qty 100

## 2020-12-06 MED ORDER — AMIODARONE HCL 100 MG PO TABS: 100.0000 mg | ORAL_TABLET | Freq: Every day | ORAL | Status: DC

## 2020-12-06 MED ORDER — MORPHINE SULFATE (PF) 2 MG/ML IV SOLN
2.0000 mg | INTRAVENOUS | Status: DC | PRN
  Administered 2020-12-06 – 2020-12-07 (×3): 2 mg via INTRAVENOUS
  Filled 2020-12-06 (×3): qty 1

## 2020-12-06 MED ORDER — WARFARIN 0.5 MG HALF TABLET
0.5000 mg | ORAL_TABLET | Freq: Once | ORAL | Status: DC
  Filled 2020-12-06: qty 1

## 2020-12-06 MED ORDER — LOPERAMIDE HCL 2 MG PO CAPS: 2.0000 mg | ORAL_CAPSULE | ORAL | Status: DC | PRN

## 2020-12-06 MED ORDER — SCOPOLAMINE 1 MG/3DAYS TD PT72
1.0000 | MEDICATED_PATCH | TRANSDERMAL | Status: DC
  Administered 2020-12-06: 1.5 mg via TRANSDERMAL
  Filled 2020-12-06 (×2): qty 1

## 2020-12-06 MED ORDER — POLYETHYLENE GLYCOL 3350 17 G PO PACK: 17.0000 g | PACK | Freq: Every day | ORAL | Status: DC | PRN

## 2020-12-06 MED ORDER — DORZOLAMIDE HCL-TIMOLOL MAL 2-0.5 % OP SOLN
1.0000 [drp] | Freq: Two times a day (BID) | OPHTHALMIC | Status: DC
  Filled 2020-12-06: qty 10

## 2020-12-06 MED ORDER — VANCOMYCIN VARIABLE DOSE PER UNSTABLE RENAL FUNCTION (PHARMACIST DOSING): Status: DC

## 2020-12-06 MED ORDER — SODIUM CHLORIDE 0.9 % IV BOLUS
1000.0000 mL | Freq: Once | INTRAVENOUS | Status: AC
  Administered 2020-12-06: 1000 mL via INTRAVENOUS

## 2020-12-06 MED ORDER — PRAVASTATIN SODIUM 20 MG PO TABS: 20.0000 mg | ORAL_TABLET | Freq: Every day | ORAL | Status: DC

## 2020-12-06 MED ORDER — ONDANSETRON HCL 4 MG/2ML IJ SOLN: 4.0000 mg | Freq: Three times a day (TID) | INTRAMUSCULAR | Status: DC | PRN

## 2020-12-06 MED ORDER — ASCORBIC ACID 500 MG PO TABS: 500.0000 mg | ORAL_TABLET | Freq: Every day | ORAL | Status: DC

## 2020-12-06 MED ORDER — ALBUTEROL SULFATE (2.5 MG/3ML) 0.083% IN NEBU
2.5000 mg | INHALATION_SOLUTION | RESPIRATORY_TRACT | Status: DC | PRN
Start: 2020-12-06 — End: 2020-12-06

## 2020-12-06 MED ORDER — TRAMADOL HCL 50 MG PO TABS: 50.0000 mg | ORAL_TABLET | Freq: Two times a day (BID) | ORAL | Status: DC | PRN

## 2020-12-06 MED ORDER — LORAZEPAM 2 MG/ML IJ SOLN
1.0000 mg | Freq: Four times a day (QID) | INTRAMUSCULAR | Status: DC | PRN
  Administered 2020-12-07: 1 mg via INTRAVENOUS
  Filled 2020-12-06: qty 1

## 2020-12-06 MED ORDER — HYDROCORTISONE SOD SUC (PF) 100 MG IJ SOLR
100.0000 mg | Freq: Once | INTRAMUSCULAR | Status: AC
  Administered 2020-12-06: 100 mg via INTRAVENOUS
  Filled 2020-12-06: qty 2

## 2020-12-06 NOTE — Progress Notes (Signed)
PHARMACY -  BRIEF ANTIBIOTIC NOTE   Pharmacy has received consult(s) for sepsis from an ED provider.  The patient's profile has been reviewed for ht/wt/allergies/indication/available labs.    One time order(s) placed for vancomycin 1,000 x 1 and cefepime 2 grams x 1   Further antibiotics/pharmacy consults should be ordered by admitting physician if indicated.                       Thank you, Wynelle Cleveland, PharmD Pharmacy Resident  12/12/2020 3:22 PM

## 2020-12-06 NOTE — H&P (Signed)
History and Physical    [name] [name] PJK:[id number] DOB: 1922-05-05 DOA: 12/23/2020  Referring MD/NP/PA:   PCP: Owens Loffler, MD   Patient coming from:  The patient is coming from SNF.      Chief Complaint: AMS  HPI: [name] [name] is a 87 y.o. male with medical history significant of CKD-4, hypertension, hyperlipidemia, GERD, hypothyroidism, depression, prostate cancer (s/p of radiation and brachytherapy), atrial fibrillation on Coumadin, anemia, pacemaker placement, urethral stricture, who presents with altered mental status.  Per his son at at bedside, patient was recently hospitalized and had cystoscopy with urology and balloon dilation of urethral stricture.  Foley catheter was left in place. Pt was discharged to rehab facility. His son states that patient used to be alert oriented x3, but his mental status has been declining in the past several days, much worse today. When I saw pt in ED, pt is unresponsive and does not move extremities on painful stimuli.  Per his son, patient has been having shortness breath and dry cough in the past several days, no active nausea, vomiting or diarrhea noted.  Does not seem to have chest pain or abdominal pain per his son.  No hematuria noted.  Not sure if patient has symptoms of UTI.   Patient was found to have persistent hypotension with SBP at 70-80s which does not respond to IV fluid resuscitation.  Patient was given more than 2 L normal saline bolus and 100 mg of Solu-Cortef, his SBP is still in 70s.  Mental status has no any improvement.  Patient is in comatose status.  ED Course: pt was found to have WBC 7.1, lactic acid of 0.6, INR 3.0, pending urinalysis, pending COVID PCR, slightly worsening renal function, hypothermia with temperature 90.2, heart rate 59, RR 21, oxygen saturation 99% on 2 L oxygen.  CT head is negative for acute intracranial abnormalities.  Chest x-ray showed cardiomegaly and layered bilateral pleural effusion with  consolidation.  Patient is admitted to [facility] bed as inpatient for comfort care.   Review of Systems: Could not be reviewed due to comatose status.  Allergy:  Allergies  Allergen Reactions   Penicillins Rash and Other (See Comments)    Has patient had a PCN reaction causing immediate rash, facial/tongue/throat swelling, SOB or lightheadedness with hypotension: {no Has patient had a PCN reaction causing severe rash involving mucus membranes or skin necrosis: {no Has patient had a PCN reaction that required hospitalization {no Has patient had a PCN reaction occurring within the last 10 years: {no If all of the above answers are "NO", then may proceed with Cephalosporin use.    Past Medical History:  Diagnosis Date   Anemia    Aortic atherosclerosis ([name])    Balance problems    uses walker   Cardiac pacemaker in situ 07/2005   symptomatic bradycardia   CKD (chronic kidney disease) stage 3, GFR 30-59 ml/min ([name]) 04/13/2017   Closed right hip fracture, initial encounter ([place]) 02/14/2019   Diverticulosis of colon (without mention of hemorrhage)    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    GERD (gastroesophageal reflux disease)    Glaucoma    Hyperlipidemia    Hypertension    Neck arthritis, Severe, multi-level 06/09/2013   Paroxysmal atrial fibrillation ([name])    s/p failed ablation   Prostate cancer ([place]) 12/2000   5 weeks XRT + brachytherapy   Radiation proctitis    Sinoatrial node dysfunction ([name])    Skin cancer    Skin  cancer of eyelid, left 10/2007   Unspecified glaucoma(365.9)    Unspecified hypothyroidism    Vitamin B12 deficiency     Past Surgical History:  Procedure Laterality Date   CARDIAC CATHETERIZATION     CARDIAC ELECTROPHYSIOLOGY [place] AND ABLATION  2001, 2003   failure   CATARACT EXTRACTION     CYSTOGRAM N/A 09/21/2018   Procedure: CYSTOGRAM;  Surgeon: Abbie Sons, MD;  Location: [facility];  Service: Urology;  Laterality: N/A;    CYSTOSCOPY WITH DIRECT VISION INTERNAL URETHROTOMY N/A 11/26/2020   Procedure: CYSTOSCOPY WITH DIRECT VISION INTERNAL URETHROTOMY-OPTILUM;  Surgeon: Abbie Sons, MD;  Location: [facility];  Service: Urology;  Laterality: N/A;   CYSTOSCOPY WITH URETHRAL DILATATION N/A 01/29/2017   Procedure: CYSTOSCOPY WITH URETHRAL DILATATION;  Surgeon: Abbie Sons, MD;  Location: [facility];  Service: Urology;  Laterality: N/A;   CYSTOSCOPY WITH URETHRAL DILATATION N/A 09/21/2018   Procedure: CYSTOSCOPY WITH URETHRAL DILATATION;  Surgeon: Abbie Sons, MD;  Location: [facility];  Service: Urology;  Laterality: N/A;   CYSTOURETHROSCOPY N/A 11/07/2014   EP IMPLANTABLE DEVICE N/A 02/24/2016   Procedure: PPM Generator Changeout;  Surgeon: Deboraha Sprang, MD;  Location: [facility];  Service: Cardiovascular;  Laterality: N/A;   gastric ulcer surgery     HERNIA REPAIR  1994, 2001, 2003   s/p drainage and complications, 04/7060, 05/7626 mesh removal   HIP ARTHROPLASTY Right 02/16/2019   Procedure: RIGHT ANTERIOR HIP HEMIARTHROPLASTY,CEMENTED;  Surgeon: Hessie Knows, MD;  Location: [facility];  Service: Orthopedics;  Laterality: Right;   INSERT / REPLACE / REMOVE PACEMAKER  07/2005   symptomatic bradycardia   kidney stones     NOSE SURGERY     cancer removed    PROSTATE SURGERY     SKIN CANCER DESTRUCTION     TOOTH EXTRACTION      Social History:  reports that he has never smoked. He has never used smokeless tobacco. He reports that he does not drink alcohol and does not use drugs.  Family History:  Family History  Problem Relation Age of Onset   Breast cancer Mother    Bone cancer Father    Alcohol abuse Other    Coronary artery disease Other    Dementia Other    Diabetes Other      Prior to Admission medications   Medication Sig Start Date End Date Taking? Authorizing Provider  acetaminophen (TYLENOL) 500 MG tablet Take 500 mg by mouth every 6 (six) hours as needed for moderate pain or  headache.   Yes [provider]  alendronate (FOSAMAX) 70 MG tablet TAKE 1 TABLET BY MOUTH EVERY 7 DAYS WITH A FULL GLASS OF WATER AND ON AN EMPTY STOMACH Patient taking differently: Take 70 mg by mouth every Thursday. TAKE 1 TABLET BY MOUTH EVERY 7 DAYS WITH A FULL GLASS OF WATER AND ON AN EMPTY STOMACH 04/30/20  Yes Copland, Frederico Hamman, MD  amiodarone (PACERONE) 200 MG tablet TAKE 1/2 TABLET(100 MG) BY MOUTH DAILY Patient taking differently: Take 100 mg by mouth every evening. 03/27/20  Yes Deboraha Sprang, MD  ascorbic acid (VITAMIN C) 500 MG tablet Take 1 tablet (500 mg total) by mouth daily. 11/30/20 02/28/21 Yes Val Riles, MD  cyanocobalamin (,VITAMIN B-12,) 1000 MCG/ML injection Inject 1,000 mcg into the muscle every 30 (thirty) days.   Yes [provider]  docusate sodium (COLACE) 50 MG capsule Take 50 mg by mouth 2 (two) times daily as needed for mild  constipation.   Yes [provider]  dorzolamide-timolol (COSOPT) 22.3-6.8 MG/ML ophthalmic solution Place 1 drop into both eyes 2 (two) times daily.   Yes [provider]  iron polysaccharides (NIFEREX) 150 MG capsule Take 1 capsule (150 mg total) by mouth daily. 11/30/20 02/28/21 Yes Val Riles, MD  levothyroxine (SYNTHROID) 100 MCG tablet TAKE 1 TABLET BY MOUTH DAILY BEFORE BREAKFAST 09/18/20  Yes Copland, Frederico Hamman, MD  loperamide (IMODIUM) 2 MG capsule Take 2 mg by mouth as needed for diarrhea or loose stools.   Yes [provider]  mirtazapine (REMERON) 15 MG tablet Take 1 tablet (15 mg total) by mouth at bedtime. 11/20/20  Yes Copland, Frederico Hamman, MD  pantoprazole (PROTONIX) 40 MG tablet TAKE 1 TABLET(40 MG) BY MOUTH DAILY 08/05/20  Yes Copland, Frederico Hamman, MD  polyethylene glycol (MIRALAX / GLYCOLAX) packet Take 17 g by mouth daily as needed for moderate constipation.    Yes [provider]  pravastatin (PRAVACHOL) 40 MG tablet TAKE 1/2 TABLET(20 MG) BY MOUTH AT BEDTIME 07/04/20  Yes Copland, Frederico Hamman,  MD  simethicone (MYLICON) 542 MG chewable tablet Chew 125 mg by mouth every 6 (six) hours as needed for flatulence.   Yes [provider]  tamsulosin (FLOMAX) 0.4 MG CAPS capsule TAKE 1 CAPSULE BY MOUTH DAILY 09/18/20  Yes Copland, Frederico Hamman, MD  traMADol (ULTRAM) 50 MG tablet Take 1 tablet (50 mg total) by mouth every 6 (six) hours as needed for moderate pain. 11/29/20  Yes Val Riles, MD  Wheat Dextrin (BENEFIBER DRINK MIX PO) Take 1 scoop by mouth daily.   Yes [provider]  warfarin (COUMADIN) 1 MG tablet TAKE 1 TO 1 AND 1/2 TABLETS BY MOUTH DAILY AS DIRECTED BY COUMADIN CLINIC Patient taking differently: Take 0.5-1 mg by mouth at bedtime. TAKE 1 TO 1 AND 1/2 TABLETS BY MOUTH DAILY AS DIRECTED BY COUMADIN CLINIC  0.5 mg everyday except for fri he gets 1 mg 11/11/20   Deboraha Sprang, MD    Physical Exam: Vitals:   12/26/2020 1700 12/20/2020 1715 12/14/2020 1730 11/29/2020 1745  BP: (!) 76/48 (!) 76/50 (!) 79/43 (!) 89/72  Pulse: (!) 59 60 60 60  Resp: (!) 21 19 19  (!) 23  Temp: (!) 94.2 F (34.6 C) (!) 94.5 F (34.7 C) (!) 94.6 F (34.8 C) (!) 94.9 F (34.9 C)  TempSrc:      SpO2: 96% 98% 95% 96%  Weight:      Height:       General: Not in acute distress HEENT:       Eyes: no scleral icterus.       ENT: No discharge from the ears and nose       Neck: No JVD, no bruit, no mass felt. Heme: No neck lymph node enlargement. Cardiac: S1/S2, RRR, No murmurs, No gallops or rubs. Respiratory: Decreased air movement bilaterally. GI: Soft, nondistended, nontender, no rebound pain, no organomegaly, BS present. GU: No hematuria Ext: 1+ pitting leg edema bilaterally. 1+DP/PT pulse bilaterally. Musculoskeletal: No joint deformities, No joint redness or warmth, no limitation of ROM in spin. Skin: No rashes.  Neuro: Patient is unresponsive, in comatose status, not move extremities on painful stimuli. Psych: Patient is not psychotic  Labs on Admission: I have personally reviewed  following labs and imaging studies  CBC: Recent Labs  Lab 12/26/2020 1255  WBC 7.1  NEUTROABS 5.5  HGB 9.9*  HCT 33.2*  MCV 108.9*  PLT 706   Basic Metabolic Panel: Recent Labs  Lab 12/18/2020 1255  NA 139  K 4.9  CL 113*  CO2 20*  GLUCOSE 111*  BUN 60*  CREATININE 4.57*  CALCIUM 8.6*   GFR: Estimated Creatinine Clearance: 9.4 mL/min (A) (by C-G formula based on SCr of 4.57 mg/dL (H)). Liver Function Tests: No results for input(s): AST, ALT, ALKPHOS, BILITOT, PROT, ALBUMIN in the last 168 hours. No results for input(s): LIPASE, AMYLASE in the last 168 hours. No results for input(s): AMMONIA in the last 168 hours. Coagulation Profile: Recent Labs  Lab 11/29/2020 1255  INR 3.0*   Cardiac Enzymes: No results for input(s): CKTOTAL, CKMB, CKMBINDEX, TROPONINI in the last 168 hours. BNP (last 3 results) No results for input(s): PROBNP in the last 8760 hours. HbA1C: No results for input(s): HGBA1C in the last 72 hours. CBG: No results for input(s): GLUCAP in the last 168 hours. Lipid Profile: No results for input(s): CHOL, HDL, LDLCALC, TRIG, CHOLHDL, LDLDIRECT in the last 72 hours. Thyroid Function Tests: No results for input(s): TSH, T4TOTAL, FREET4, T3FREE, THYROIDAB in the last 72 hours. Anemia Panel: No results for input(s): VITAMINB12, FOLATE, FERRITIN, TIBC, IRON, RETICCTPCT in the last 72 hours. Urine analysis:    Component Value Date/Time   COLORURINE YELLOW (A) 11/21/2020 1600   APPEARANCEUR HAZY (A) 11/21/2020 1600   APPEARANCEUR Cloudy (A) 10/24/2020 1309   LABSPEC 1.009 11/21/2020 1600   PHURINE 6.0 11/21/2020 1600   GLUCOSEU NEGATIVE 11/21/2020 1600   HGBUR MODERATE (A) 11/21/2020 1600   BILIRUBINUR NEGATIVE 11/21/2020 1600   BILIRUBINUR Negative 10/24/2020 1309   KETONESUR NEGATIVE 11/21/2020 1600   PROTEINUR 30 (A) 11/21/2020 1600   UROBILINOGEN 0.2 09/11/2020 1507   NITRITE NEGATIVE 11/21/2020 1600   LEUKOCYTESUR LARGE (A) 11/21/2020 1600    Sepsis Labs: @LABRCNTIP (procalcitonin:4,lacticidven:4) ) Recent Results (from the past 240 hour(s))  SARS CORONAVIRUS 2 (TAT 6-24 HRS) Nasopharyngeal     Status: None   Collection Time: 11/28/20  2:38 PM   Specimen: Nasopharyngeal  Result Value Ref Range Status   SARS Coronavirus 2 NEGATIVE NEGATIVE Final    Comment: (NOTE) SARS-CoV-2 target nucleic acids are NOT DETECTED.  The SARS-CoV-2 RNA is generally detectable in upper and lower respiratory specimens during the acute phase of infection. Negative results do not preclude SARS-CoV-2 infection, do not rule out co-infections with other pathogens, and should not be used as the sole basis for treatment or other patient management decisions. Negative results must be combined with clinical observations, patient history, and epidemiological information. The expected result is Negative.  Fact Sheet for Patients: SugarRoll.be  Fact Sheet for Healthcare Providers: https://www.woods-mathews.com/  This test is not yet approved or cleared by the [place] FDA and  has been authorized for detection and/or diagnosis of SARS-CoV-2 by FDA under an Emergency Use Authorization (EUA). This EUA will remain  in effect (meaning this test can be used) for the duration of the COVID-19 declaration under Se ction 564(b)(1) of the Act, 21 U.S.C. section 360bbb-3(b)(1), unless the authorization is terminated or revoked sooner.  Performed at [facility], [place] [address], [place], [place] [address]   Resp Panel by RT-PCR (Flu A&B, Covid) Nasopharyngeal Swab     Status: None   Collection Time: 12/10/2020  4:17 PM   Specimen: Nasopharyngeal Swab; Nasopharyngeal(NP) swabs in vial transport medium  Result Value Ref Range Status   SARS Coronavirus 2 by RT PCR NEGATIVE NEGATIVE Final    Comment: (NOTE) SARS-CoV-2 target nucleic acids are NOT DETECTED.  The SARS-CoV-2 RNA  is generally detectable in upper  respiratory specimens during the acute phase of infection. The lowest concentration of SARS-CoV-2 viral copies this assay can detect is 138 copies/mL. A negative result does not preclude SARS-Cov-2 infection and should not be used as the sole basis for treatment or other patient management decisions. A negative result may occur with  improper specimen collection/handling, submission of specimen other than nasopharyngeal swab, presence of viral mutation(s) within the areas targeted by this assay, and inadequate number of viral copies(<138 copies/mL). A negative result must be combined with clinical observations, patient history, and epidemiological information. The expected result is Negative.  Fact Sheet for Patients:  EntrepreneurPulse.com.au  Fact Sheet for Healthcare Providers:  IncredibleEmployment.be  This test is no t yet approved or cleared by the [place] FDA and  has been authorized for detection and/or diagnosis of SARS-CoV-2 by FDA under an Emergency Use Authorization (EUA). This EUA will remain  in effect (meaning this test can be used) for the duration of the COVID-19 declaration under Section 564(b)(1) of the Act, 21 U.S.C.section 360bbb-3(b)(1), unless the authorization is terminated  or revoked sooner.       Influenza A by PCR NEGATIVE NEGATIVE Final   Influenza B by PCR NEGATIVE NEGATIVE Final    Comment: (NOTE) The Xpert Xpress SARS-CoV-2/FLU/RSV plus assay is intended as an aid in the diagnosis of influenza from Nasopharyngeal swab specimens and should not be used as a sole basis for treatment. Nasal washings and aspirates are unacceptable for Xpert Xpress SARS-CoV-2/FLU/RSV testing.  Fact Sheet for Patients: EntrepreneurPulse.com.au  Fact Sheet for Healthcare Providers: IncredibleEmployment.be  This test is not yet approved or cleared by the [place] FDA and has been  authorized for detection and/or diagnosis of SARS-CoV-2 by FDA under an Emergency Use Authorization (EUA). This EUA will remain in effect (meaning this test can be used) for the duration of the COVID-19 declaration under Section 564(b)(1) of the Act, 21 U.S.C. section 360bbb-3(b)(1), unless the authorization is terminated or revoked.  Performed at [facility], [address] [place], [place] [address]      Radiological Exams on Admission: CT HEAD WO CONTRAST (5MM)  Result Date: 12/17/2020 CLINICAL DATA:  Altered mental status EXAM: CT HEAD WITHOUT CONTRAST TECHNIQUE: Contiguous axial images were obtained from the base of the skull through the vertex without intravenous contrast. COMPARISON:  11/05/2020 FINDINGS: Brain: No evidence of acute infarction, hemorrhage, hydrocephalus, extra-axial collection or mass lesion/mass effect. Mild periventricular white matter hypodensity. Vascular: No hyperdense vessel or unexpected calcification. Skull: Normal. Negative for fracture or focal lesion. Sinuses/Orbits: No acute finding. Other: None. IMPRESSION: No acute intracranial pathology. Small-vessel white matter disease. Electronically Signed   By: Eddie Candle M.D.   On: 12/16/2020 14:30   DG Chest Portable 1 View  Result Date: 12/24/2020 CLINICAL DATA:  Altered mental status EXAM: PORTABLE CHEST 1 VIEW COMPARISON:  11/21/2020 FINDINGS: Cardiomegaly with left chest multi lead pacer. Layering bilateral pleural effusions associated atelectasis or consolidation. IMPRESSION: 1.  Cardiomegaly. 2. Layering bilateral pleural effusions and associated atelectasis or consolidation. Electronically Signed   By: Eddie Candle M.D.   On: 11/30/2020 13:58     EKG: I have personally reviewed.  Seems to be atrial fibrillation, QTC 478, low voltage, poor R wave progression  Assessment/Plan Principal Problem:   Comfort measures only status Active Problems:   Hypothyroidism   Hyperlipidemia   Essential  hypertension   Atrial fibrillation ([name])   GERD   Chronic kidney disease (CKD),  stage IV (severe) ([name])   Severe sepsis with septic shock ([name])   HCAP (healthcare-associated pneumonia)   Macrocytic anemia   Depression   Pleural effusion   Acute metabolic encephalopathy   Comatose ([name])   Hypothermia   Comfort measures only status:  Patient has multiple chronic comorbidities. No presents with severe sepsis with septic shock, which is likely due to HCAP and possible empyema. Patient is in comatose status.  Patient has persistent hypotension which does not respond to IV fluid resuscitation, broad antibiotics (vancomycin and cefepime) and steroid treatment. His prognosis is extremely poor. I have had extensive discussion with her daughter and son about goal of care. Patient's family members were very supportive. They agree for comfort care now, which is reasonable given his extremely poor prognosis. Pt will be DNR.  -Will admit to regular bed for comfort care. -Stop drawing labs and IVF -prn Ativan for agitation -As needed morphine for pain -Naphazoline 0.1 % ophthalmic solution -scopolamine 1.5 MG patch  -Psycho/Social: emotional support offered to patient and family at bedside -consult to palliterative care -Nurse may pronounce death   Other medical problem as below, will stop treatment and start comfort care    Hypothyroidism   Hyperlipidemia   Essential hypertension   Atrial fibrillation ([name])   GERD   Chronic kidney disease (CKD), stage IV (severe) ([name])   Severe sepsis with septic shock ([name])   HCAP (healthcare-associated pneumonia)   Macrocytic anemia   Depression   Pleural effusion   Acute metabolic encephalopathy   Comatose ([place])  Hypothermia    DVT ppx: none Code Status: DNR Family Communication: Yes, patient's son at bed side and daughter by phone Disposition Plan:  Anticipate discharge back to previous environment Consults called:  none Admission status and  Level of care: Med-Surg:     as inpt       Status is: Inpatient  Remains inpatient appropriate because:Inpatient level of care appropriate due to severity of illness  Dispo: The patient is from: SNF              Anticipated d/c is to:  to be determined              Patient currently is not medically stable to d/c.   Difficult to place patient No         Date of Service 12/27/2020    Ivor Costa [facility]   If 7PM-7AM, please contact night-coverage [email or web address] 12/16/2020, 6:11 PM

## 2020-12-06 NOTE — Progress Notes (Signed)
CODE SEPSIS - PHARMACY COMMUNICATION  **Broad Spectrum Antibiotics should be administered within 1 hour of Sepsis diagnosis**  Time Code Sepsis Called/Page Received: 1443  Antibiotics Ordered: vancomycin 1,000 mg + cefepime 2 grams  Time of 1st antibiotic administration: 1454  Additional action taken by pharmacy: None  If necessary, Name of Provider/Nurse Contacted: N/a   Wynelle Cleveland, PharmD Pharmacy Resident  12/05/2020 2:45 PM

## 2020-12-06 NOTE — ED Notes (Addendum)
Per Dr Blaine Hamper at bedside, pt's now comfort care. "No labs, no IVF, discontinue everything. Oxygen okay".

## 2020-12-06 NOTE — ED Provider Notes (Signed)
[facility] Emergency Department Provider Note ____________________________________________   Event Date/Time   First MD Initiated Contact with Patient 12/05/2020 1258     (approximate)  I have reviewed the triage vital signs and the nursing notes.  HISTORY  Chief Complaint Altered Mental Status   HPI [name] [name] is a 87 y.o. Audrie Gallus presents to the ED for evaluation of altered mentation.   Chart review indicates recent medical admission 8/25-9/2, discharged to SNF.  Admitted for AKI on CKD 4.  8/30 cystoscopy with urology and balloon dilation of a urethral stricture.  Foley catheter left in place, planning to follow-up in 1 week and potentially remove then. Otherwise paroxysmal atrial fibrillation on amiodarone and Coumadin.  Patient returns to the ED from his SNF via EMS for evaluation of altered and decreased mentation over the past few days.  He is reportedly alert and oriented x4 at baseline, and rather talkative, but for the past few days he has been much more reserved, quiet and seemingly in declining health.  Here in the ED, patient reports that he feels fine and has no complaints.  He is oriented to year, location and situation.  He reports that he was sent over here to "get him feeling better."  Past Medical History:  Diagnosis Date   Anemia    Aortic atherosclerosis ([name])    Balance problems    uses walker   Cardiac pacemaker in situ 07/2005   symptomatic bradycardia   CKD (chronic kidney disease) stage 3, GFR 30-59 ml/min ([name]) 04/13/2017   Closed right hip fracture, initial encounter ([place]) 02/14/2019   Diverticulosis of colon (without mention of hemorrhage)    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    GERD (gastroesophageal reflux disease)    Glaucoma    Hyperlipidemia    Hypertension    Neck arthritis, Severe, multi-level 06/09/2013   Paroxysmal atrial fibrillation ([name])    s/p failed ablation   Prostate cancer ([place]) 12/2000    5 weeks XRT + brachytherapy   Radiation proctitis    Sinoatrial node dysfunction ([name])    Skin cancer    Skin cancer of eyelid, left 10/2007   Unspecified glaucoma(365.9)    Unspecified hypothyroidism    Vitamin B12 deficiency     Patient Active Problem List   Diagnosis Date Noted   Protein-calorie malnutrition, severe 11/23/2020   AKI (acute kidney injury) ([place]) 11/21/2020   Aortic atherosclerosis ([place]) 10/22/2020   Age-related osteoporosis with current pathological fracture with routine healing 02/21/2019   Chronic kidney disease (CKD), stage IV (severe) ([place]) 02/21/2019   Longstanding persistent atrial fibrillation ([place])    Glaucoma    Long term (current) use of anticoagulants 06/24/2010   GLAUCOMA 08/07/2008   GERD 08/07/2008   PACEMAKER, PERMANENT 08/07/2008   Vitamin B 12 deficiency 07/19/2008   RADIATION PROCTITIS 07/18/2008   DIVERTICULOSIS, COLON 07/18/2008   Prostate cancer, in remission 07/18/2008   Essential hypertension 05/09/2008   Hyperlipidemia 02/06/2008   Atrial fibrillation ([place]) 02/06/2008   Hypothyroidism 12/01/2007    Past Surgical History:  Procedure Laterality Date   [place] AND ABLATION  2001, 2003   failure   CATARACT EXTRACTION     CYSTOGRAM N/A 09/21/2018   Procedure: CYSTOGRAM;  Surgeon: Abbie Sons, MD;  Location: [facility] ORS;  Service: Urology;  Laterality: N/A;   CYSTOSCOPY WITH DIRECT VISION INTERNAL URETHROTOMY N/A 11/26/2020   Procedure: CYSTOSCOPY WITH DIRECT VISION INTERNAL URETHROTOMY-OPTILUM;  Surgeon: Abbie Sons, MD;  Location: [facility] OR[facility];  Service: Urology;  Laterality: N/A;   CYSTOSCOPY WITH URETHRAL DILATATION N/A 01/29/2017   Procedure: CYSTOSCOPY WITH URETHRAL DILATATION;  Surgeon: Abbie Sons, MD;  Location: [facility] ORS;  Service: Urology;  Laterality: N/A;   CYSTOSCOPY WITH URETHRAL DILATATION N/A 09/21/2018   Procedure: CYSTOSCOPY WITH URETHRAL DILATATION;   Surgeon: Abbie Sons, MD;  Location: [facility] OR[facility];  Service: Urology;  Laterality: N/A;   CYSTOURETHROSCOPY N/A 11/07/2014   EP IMPLANTABLE DEVICE N/A 02/24/2016   Procedure: PPM Generator Changeout;  Surgeon: Deboraha Sprang, MD;  Location: [facility];  Service: Cardiovascular;  Laterality: N/A;   gastric ulcer surgery     HERNIA REPAIR  1994, 2001, 2003   s/p drainage and complications, 10/6576, 06/6960 mesh removal   HIP ARTHROPLASTY Right 02/16/2019   Procedure: RIGHT ANTERIOR HIP HEMIARTHROPLASTY,CEMENTED;  Surgeon: Hessie Knows, MD;  Location: [facility] OR[facility];  Service: Orthopedics;  Laterality: Right;   INSERT / REPLACE / REMOVE PACEMAKER  07/2005   symptomatic bradycardia   kidney stones     NOSE SURGERY     cancer removed    PROSTATE SURGERY     SKIN CANCER DESTRUCTION     TOOTH EXTRACTION      Prior to Admission medications   Medication Sig Start Date End Date Taking? Authorizing Provider  acetaminophen (TYLENOL) 500 MG tablet Take 500 mg by mouth every 6 (six) hours as needed for moderate pain or headache. Patient not taking: Reported on 11/21/2020    [provider]  alendronate (FOSAMAX) 70 MG tablet TAKE 1 TABLET BY MOUTH EVERY 7 DAYS WITH A FULL GLASS OF WATER AND ON AN EMPTY STOMACH Patient taking differently: Take 70 mg by mouth every Thursday. TAKE 1 TABLET BY MOUTH EVERY 7 DAYS WITH A FULL GLASS OF WATER AND ON AN EMPTY STOMACH 04/30/20   Copland, Frederico Hamman, MD  amiodarone (PACERONE) 200 MG tablet TAKE 1/2 TABLET(100 MG) BY MOUTH DAILY Patient taking differently: Take 100 mg by mouth every evening. 03/27/20   Deboraha Sprang, MD  ascorbic acid (VITAMIN C) 500 MG tablet Take 1 tablet (500 mg total) by mouth daily. 11/30/20 02/28/21  Val Riles, MD  cyanocobalamin (,VITAMIN B-12,) 1000 MCG/ML injection Inject 1,000 mcg into the muscle every 30 (thirty) days.    [provider]  docusate sodium (COLACE) 50 MG capsule Take 50 mg by mouth 2 (two) times daily as  needed for mild constipation.    [provider]  dorzolamide-timolol (COSOPT) 22.3-6.8 MG/ML ophthalmic solution Place 1 drop into both eyes 2 (two) times daily.    [provider]  iron polysaccharides (NIFEREX) 150 MG capsule Take 1 capsule (150 mg total) by mouth daily. 11/30/20 02/28/21  Val Riles, MD  levothyroxine (SYNTHROID) 100 MCG tablet TAKE 1 TABLET BY MOUTH DAILY BEFORE BREAKFAST 09/18/20   Copland, Frederico Hamman, MD  loperamide (IMODIUM) 2 MG capsule Take 2 mg by mouth as needed for diarrhea or loose stools.    [provider]  mirtazapine (REMERON) 15 MG tablet Take 1 tablet (15 mg total) by mouth at bedtime. 11/20/20   Copland, Frederico Hamman, MD  pantoprazole (PROTONIX) 40 MG tablet TAKE 1 TABLET(40 MG) BY MOUTH DAILY 08/05/20   Copland, Frederico Hamman, MD  polyethylene glycol (MIRALAX / GLYCOLAX) packet Take 17 g by mouth daily as needed for moderate constipation.     [provider]  pravastatin (PRAVACHOL) 40 MG tablet TAKE 1/2 TABLET(20 MG) BY MOUTH  AT BEDTIME 07/04/20   Copland, Frederico Hamman, MD  simethicone (MYLICON) 329 MG chewable tablet Chew 125 mg by mouth every 6 (six) hours as needed for flatulence.    [provider]  tamsulosin (FLOMAX) 0.4 MG CAPS capsule TAKE 1 CAPSULE BY MOUTH DAILY 09/18/20   Copland, Frederico Hamman, MD  traMADol (ULTRAM) 50 MG tablet Take 1 tablet (50 mg total) by mouth every 6 (six) hours as needed for moderate pain. 11/29/20   Val Riles, MD  warfarin (COUMADIN) 1 MG tablet TAKE 1 TO 1 AND 1/2 TABLETS BY MOUTH DAILY AS DIRECTED BY COUMADIN CLINIC Patient taking differently: Take 0.5-1 mg by mouth at bedtime. TAKE 1 TO 1 AND 1/2 TABLETS BY MOUTH DAILY AS DIRECTED BY COUMADIN CLINIC  0.5 mg everyday except for fri he gets 1 mg 11/11/20   Deboraha Sprang, MD  Wheat Dextrin (BENEFIBER DRINK MIX PO) Take 1 scoop by mouth daily.    [provider]    Allergies Penicillins  Family History  Problem Relation Age of Onset   Breast  cancer Mother    Bone cancer Father    Alcohol abuse Other    Coronary artery disease Other    Dementia Other    Diabetes Other     Social History Social History   Tobacco Use   Smoking status: Never   Smokeless tobacco: Never  Vaping Use   Vaping Use: Never used  Substance Use Topics   Alcohol use: No   Drug use: No    Review of Systems  Constitutional: No fever/chills.  Positive generalized weakness. Eyes: No visual changes. ENT: No sore throat. Cardiovascular: Denies chest pain. Respiratory: Denies shortness of breath. Gastrointestinal: No abdominal pain.  No nausea, no vomiting.  No diarrhea.  No constipation. Genitourinary: Negative for dysuria. Musculoskeletal: Negative for back pain. Skin: Negative for rash. Neurological: Negative for headaches, focal weakness or numbness.  ____________________________________________   PHYSICAL EXAM:  VITAL SIGNS: Vitals:   12/05/2020 1330 12/02/2020 1406  BP: 101/68   Pulse: 60   Resp: (!) 21   Temp:  (!) 92.6 F (33.7 C)  SpO2: 100%      Constitutional: Alert and oriented to person, location, situation and year. Well appearing and in no acute distress.  Follows commands in all 4 Eyes: Conjunctivae are normal. PERRL. EOMI. Head: Atraumatic. Nose: No congestion/rhinnorhea. Mouth/Throat: Mucous membranes are moist.  Oropharynx non-erythematous. Neck: No stridor. No cervical spine tenderness to palpation. Cardiovascular: Normal rate, regular rhythm. Grossly normal heart sounds.  Good peripheral circulation. Respiratory: Normal respiratory effort.  No retractions. Lungs CTAB. Gastrointestinal: Soft , nondistended, nontender to palpation. No CVA tenderness. Indwelling Foley catheter in place, draining cloudy yellow urine Musculoskeletal: No lower extremity tenderness nor edema.  No joint effusions. No signs of acute trauma. Neurologic:  Normal speech and language. No gross focal neurologic deficits are appreciated.   Cranial nerves II through XII intact 5/5 strength and sensation in all 4 extremities Skin:  Skin is warm, dry and intact. No rash noted. Psychiatric: Mood and affect are normal. Speech and behavior are normal.  ____________________________________________   LABS (all labs ordered are listed, but only abnormal results are displayed)  Labs Reviewed  CBC WITH DIFFERENTIAL/PLATELET - Abnormal; Notable for the following components:      Result Value   RBC 3.05 (*)    Hemoglobin 9.9 (*)    HCT 33.2 (*)    MCV 108.9 (*)    MCHC 29.8 (*)    RDW  15.9 (*)    Abs Immature Granulocytes 0.12 (*)    All other components within normal limits  BASIC METABOLIC PANEL - Abnormal; Notable for the following components:   Chloride 113 (*)    CO2 20 (*)    Glucose, Bld 111 (*)    BUN 60 (*)    Creatinine, Ser 4.57 (*)    Calcium 8.6 (*)    GFR, Estimated 11 (*)    All other components within normal limits  PROTIME-INR - Abnormal; Notable for the following components:   Prothrombin Time 31.1 (*)    INR 3.0 (*)    All other components within normal limits  URINE CULTURE  CULTURE, BLOOD (SINGLE)  CULTURE, BLOOD (SINGLE)  URINALYSIS, COMPLETE (UACMP) WITH MICROSCOPIC  LACTIC ACID, PLASMA  LACTIC ACID, PLASMA  PROCALCITONIN   ____________________________________________  12 Lead EKG  Paced rhythm with a rate of 60 bpm.  Normal axis and appropriate intervals.  No evidence of acute ischemia. ____________________________________________  RADIOLOGY  ED MD interpretation: 1 view CXR reviewed by me with bilateral infiltration and pleural effusions CT head reviewed by me without evidence of acute cranial pathology.  Official radiology report(s): CT HEAD WO CONTRAST (5MM)  Result Date: 12/27/2020 CLINICAL DATA:  Altered mental status EXAM: CT HEAD WITHOUT CONTRAST TECHNIQUE: Contiguous axial images were obtained from the base of the skull through the vertex without intravenous contrast.  COMPARISON:  11/05/2020 FINDINGS: Brain: No evidence of acute infarction, hemorrhage, hydrocephalus, extra-axial collection or mass lesion/mass effect. Mild periventricular white matter hypodensity. Vascular: No hyperdense vessel or unexpected calcification. Skull: Normal. Negative for fracture or focal lesion. Sinuses/Orbits: No acute finding. Other: None. IMPRESSION: No acute intracranial pathology. Small-vessel white matter disease. Electronically Signed   By: Eddie Candle M.D.   On: 12/09/2020 14:30   DG Chest Portable 1 View  Result Date: 12/04/2020 CLINICAL DATA:  Altered mental status EXAM: PORTABLE CHEST 1 VIEW COMPARISON:  11/21/2020 FINDINGS: Cardiomegaly with left chest multi lead pacer. Layering bilateral pleural effusions associated atelectasis or consolidation. IMPRESSION: 1.  Cardiomegaly. 2. Layering bilateral pleural effusions and associated atelectasis or consolidation. Electronically Signed   By: Eddie Candle M.D.   On: 12/13/2020 13:58    ____________________________________________   PROCEDURES and INTERVENTIONS  Procedure(s) performed (including Critical Care):  .1-3 Lead EKG Interpretation Performed by: Vladimir Crofts, MD Authorized by: Vladimir Crofts, MD     Interpretation: normal     ECG rate:  60   ECG rate assessment: normal     Rhythm: paced     Ectopy: none     Conduction: normal   .Critical Care Performed by: Vladimir Crofts, MD Authorized by: Vladimir Crofts, MD   Critical care provider statement:    Critical care time (minutes):  45   Critical care was necessary to treat or prevent imminent or life-threatening deterioration of the following conditions:  Sepsis   Critical care was time spent personally by me on the following activities:  Discussions with consultants, evaluation of patient's response to treatment, examination of patient, ordering and performing treatments and interventions, ordering and review of laboratory studies, ordering and review of radiographic  studies, pulse oximetry, re-evaluation of patient's condition, obtaining history from patient or surrogate and review of old charts  Medications  lactated ringers infusion (has no administration in time range)  ceFEPIme (MAXIPIME) 2 g in sodium chloride 0.9 % 100 mL IVPB (has no administration in time range)  vancomycin (VANCOCIN) IVPB 1000 mg/200 mL premix (has no administration in  time range)  sodium chloride 0.9 % bolus 1,000 mL (has no administration in time range)    ____________________________________________   MDM / ED COURSE   87 year old DNR male presents to the ED with decreased mentation, with evidence of sepsis requiring medical admission.  He is hypothermic and tachypneic.  CXR with infiltrates concerning for pneumonia.  CT head without evidence of ICH or CVA.  Blood work with slightly worsening renal function on his CKD 4 and INR around goal.  Initially had difficulty getting a temperature, so rectal temperature obtained when exchanging his Foley catheters for a fresh 1 for a urine sample, and noted to be in the low 90s.  I discussed goals of care with daughter at the bedside, and she is agreeable with medical management.  We will add on blood cultures, lactic acid, procalcitonin with broad-spectrum antibiotics and expectation for medical admission.  Clinical Course as of 12/09/2020 1438  Fri Dec 06, 2020  1435 Return to the bedside when I noticed his rectal temperature was hypothermic.  His daughter is at bedside and we have long discussion about goals of care.  She reiterates that he is a DNR and would not want any aggressive measures.  She is okay with warming blankets, warmed fluids, IV antibiotics and medical admission in hopes of correcting likely sepsis.  [DS]    Clinical Course User Index [DS] Vladimir Crofts, MD    ____________________________________________   FINAL CLINICAL IMPRESSION(S) / ED DIAGNOSES  Final diagnoses:  Confusion  AKI (acute kidney injury) [facility])   Metabolic encephalopathy     ED Discharge Orders     None        Anadia Helmes   Note:  This document was prepared using Dragon voice recognition software and may include unintentional dictation errors.    Vladimir Crofts, MD 12/02/2020 [phone]

## 2020-12-06 NOTE — Sepsis Progress Note (Signed)
eLink is monitoring this Code Sepsis. °

## 2020-12-06 NOTE — Progress Notes (Signed)
Pharmacy Antibiotic Note  [name] [name] is a 87 y.o. male admitted on 12/26/2020 with pneumonia.  Pharmacy has been consulted for vancomycin and cefepime dosing.  Patient with PMH of CKD stage 4, atrial fibrillation, HTN who presented from SNF with declining mental status over past few days now in the ED responding to voice only. WBC WNL, afebrile. Patient recently admitted with encephalopathy and metabolic acidosis due to CKD. Chest X-ray showing "layering bilateral pleural effusions and associated atelectasis or consolidation". Vancomycin IV 1,000 mg LD x 1 given and now discontinued due to concern for renal damage.  Plan: Start cefepime 1 gram every 24 hours. Patient is also on linezolid 600 mg every 12 hours. Monitor renal function, clinical course, LOT.   Height: 5\' 8"  (172.7 cm) Weight: 70.3 kg (155 lb) IBW/kg (Calculated) : 68.4  Temp (24hrs), Avg:91.9 F (33.3 C), Min:90.2 F (32.3 C), Max:93 F (33.9 C)  Recent Labs  Lab 12/01/2020 1255 12/26/2020 1442  WBC 7.1  --   CREATININE 4.57*  --   LATICACIDVEN  --  0.6    Estimated Creatinine Clearance: 9.4 mL/min (A) (by C-G formula based on SCr of 4.57 mg/dL (H)).    Allergies  Allergen Reactions   Penicillins Rash and Other (See Comments)    Has patient had a PCN reaction causing immediate rash, facial/tongue/throat swelling, SOB or lightheadedness with hypotension: {no Has patient had a PCN reaction causing severe rash involving mucus membranes or skin necrosis: {no Has patient had a PCN reaction that required hospitalization {no Has patient had a PCN reaction occurring within the last 10 years: {no If all of the above answers are "NO", then may proceed with Cephalosporin use.    Antimicrobials this admission: Vancomycin 9/9 >>  cefepime 9/9 >>   Dose adjustments this admission: None  Microbiology results: 9/9 BCx: ip 9/9 MRSA PCR: ip  Thank you for allowing pharmacy to be a part of this patient's  care.   Wynelle Cleveland, PharmD Pharmacy Resident  12/08/2020 4:27 PM

## 2020-12-06 NOTE — ED Notes (Signed)
Unable to collect urine at this time, only < 5 cc in foley bag.

## 2020-12-06 NOTE — ED Notes (Signed)
Repositioned pt, pulled up on the bed. Per son at the bedside, would like to continue bair hugger, pt always cold.

## 2020-12-06 NOTE — Progress Notes (Signed)
[place] for warfarin Indication: atrial fibrillation  Allergies  Allergen Reactions   Penicillins Rash and Other (See Comments)    Has patient had a PCN reaction causing immediate rash, facial/tongue/throat swelling, SOB or lightheadedness with hypotension: {no Has patient had a PCN reaction causing severe rash involving mucus membranes or skin necrosis: {no Has patient had a PCN reaction that required hospitalization {no Has patient had a PCN reaction occurring within the last 10 years: {no If all of the above answers are "NO", then may proceed with Cephalosporin use.    Patient Measurements: Height: 5\' 8"  (172.7 cm) Weight: 70.3 kg (155 lb) IBW/kg (Calculated) : 68.4  Vital Signs: Temp: 93.7 F (34.3 C) (09/09 1630) Temp Source: Rectal (09/09 1406) BP: 87/51 (09/09 1630) Pulse Rate: 59 (09/09 1630)  Labs: Recent Labs    12/18/2020 1255  HGB 9.9*  HCT 33.2*  PLT 237  LABPROT 31.1*  INR 3.0*  CREATININE 4.57*    Estimated Creatinine Clearance: 9.4 mL/min (A) (by C-G formula based on SCr of 4.57 mg/dL (H)).   Medical History: Past Medical History:  Diagnosis Date   Anemia    Aortic atherosclerosis ([place])    Balance problems    uses walker   Cardiac pacemaker in situ 07/2005   symptomatic bradycardia   CKD (chronic kidney disease) stage 3, GFR 30-59 ml/min ([name]) 04/13/2017   Closed right hip fracture, initial encounter ([place]) 02/14/2019   Diverticulosis of colon (without mention of hemorrhage)    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    GERD (gastroesophageal reflux disease)    Glaucoma    Hyperlipidemia    Hypertension    Neck arthritis, Severe, multi-level 06/09/2013   Paroxysmal atrial fibrillation ([name])    s/p failed ablation   Prostate cancer ([place]) 12/2000   5 weeks XRT + brachytherapy   Radiation proctitis    Sinoatrial node dysfunction ([name])    Skin cancer    Skin cancer of eyelid, left 10/2007    Unspecified glaucoma(365.9)    Unspecified hypothyroidism    Vitamin B12 deficiency     Medications:  (Not in a hospital admission)  Scheduled:   [START ON 11-Dec-2020] amiodarone  100 mg Oral Daily   [START ON Dec 11, 2020] ascorbic acid  500 mg Oral Daily   dorzolamide-timolol  1 drop Both Eyes BID   [START ON December 11, 2020] iron polysaccharides  150 mg Oral Daily   [START ON 2020-12-11] levothyroxine  100 mcg Oral Q0600   mirtazapine  15 mg Oral QHS   [START ON 12/11/20] pantoprazole  40 mg Oral Daily   pravastatin  20 mg Oral Daily   vancomycin variable dose per unstable renal function (pharmacist dosing)   Does not apply See admin instructions   Infusions:   lactated ringers 150 mL/hr at 12/27/2020 1501   PRN: acetaminophen, acetaminophen, albuterol, dextromethorphan-guaiFENesin, docusate sodium, loperamide, ondansetron (ZOFRAN) IV, polyethylene glycol, simethicone, traMADol Anti-infectives (From admission, onward)    Start     Dose/Rate Route Frequency Ordered Stop   12-11-2020 1800  ceFEPIme (MAXIPIME) 1 g in sodium chloride 0.9 % 100 mL IVPB  Status:  Discontinued        1 g 200 mL/hr over 30 Minutes Intravenous Every 24 hours 12/15/2020 1616 12/03/2020 1618   12/22/2020 1700  vancomycin (VANCOREADY) IVPB 500 mg/100 mL  Status:  Discontinued        500 mg 100 mL/hr over 60 Minutes Intravenous  Once 12/02/2020 1616 12/25/2020 1618  11/29/2020 1617  vancomycin variable dose per unstable renal function (pharmacist dosing)         Does not apply See admin instructions 12/27/2020 1618     12/14/2020 1445  ceFEPIme (MAXIPIME) 2 g in sodium chloride 0.9 % 100 mL IVPB        2 g 200 mL/hr over 30 Minutes Intravenous  Once 12/26/2020 1436 12/10/2020 1524   12/17/2020 1445  vancomycin (VANCOCIN) IVPB 1000 mg/200 mL premix        1,000 mg 200 mL/hr over 60 Minutes Intravenous  Once 12/23/2020 1436 12/17/2020 1603       Assessment: 89YOM with PMH of atrial fibrillation on warfarin at home, CKD stage 4, HTN, HLD  presenting from SNF with declining mental status over past few days now in the ED with septic shock. CBC is stable.   Home warfarin regimen 1 mg daily.  INR 3.0 today  Goal of Therapy:  INR 2-3   DDI's -amiodarone: increased INR via CYP inhibition. PTA med continued. -levothyroxine: increased anticoagulant effect. PTA med continued. -cefepime: increased anticoagulant effect. New inpatient med.    Plan:  INR at upper end of range and now starting antibiotic that will increase anticoagulation effect of warfarin therefore will reduce warfarin dose. Give warfarin 0.5 mg x 1 tonight. Daily INR, CBC.   Wynelle Cleveland, PharmD Pharmacy Resident  12/02/2020 5:19 PM

## 2020-12-06 NOTE — ED Triage Notes (Signed)
Pt BIB EMS from [place], "health declining over the past few days",  only responds to voice, normally ao x 4. Per EMS. facility unable to tell when he was last AO x 4 stated "couple of weeks ago". DNR brought in with pt.  Pt now able to state name, DOB, current month/year and place. Pt states "I feel better, I couldn't talk for 2-3 days". Denies pain.

## 2020-12-07 DIAGNOSIS — Z515 Encounter for palliative care: Principal | ICD-10-CM

## 2020-12-07 MED ORDER — MORPHINE SULFATE (PF) 2 MG/ML IV SOLN: 2.0000 mg | INTRAVENOUS | Status: DC | PRN

## 2020-12-07 MED ORDER — MORPHINE 100MG IN NS 100ML (1MG/ML) PREMIX INFUSION
2.0000 mg/h | INTRAVENOUS | Status: DC
  Administered 2020-12-07 (×2): 2 mg/h via INTRAVENOUS
  Filled 2020-12-07: qty 100

## 2020-12-07 MED ORDER — GLYCOPYRROLATE 0.2 MG/ML IJ SOLN
0.1000 mg | Freq: Three times a day (TID) | INTRAMUSCULAR | Status: DC
  Administered 2020-12-07: 0.1 mg via INTRAVENOUS
  Filled 2020-12-07: qty 1

## 2020-12-11 ENCOUNTER — Ambulatory Visit: Payer: Medicare Other | Admitting: Urology

## 2020-12-11 LAB — CULTURE, BLOOD (SINGLE)
Culture: NO GROWTH
Culture: NO GROWTH
Special Requests: ADEQUATE

## 2020-12-13 ENCOUNTER — Ambulatory Visit: Payer: Medicare Other | Admitting: Thoracic Surgery (Cardiothoracic Vascular Surgery)

## 2020-12-23 ENCOUNTER — Ambulatory Visit: Payer: Medicare Other | Admitting: Family Medicine

## 2020-12-24 ENCOUNTER — Encounter: Payer: Medicare Other | Admitting: Internal Medicine

## 2020-12-28 NOTE — Death Summary Note (Signed)
Death Summary  [name] [name] [facility]:[id number] DOB: 06-Dec-1922 DOA: Dec 20, 2020  PCP: Owens Loffler, MD  Admit date: 20-Dec-2020 Date of Death: 12-21-2020 Time of Death: 07-04-1242    History of present illness:   87 year old male with history of CKD stage IV, hypertension, hyperlipidemia, GERD, hypothyroidism, depression, prostate cancer s/p radiation/brachytherapy, A. fib on Coumadin, anemia, status post pacemaker, urethral stricture who presented with altered mental status from skilled nursing facility.  He was recently hospitalized, had cystoscopy with balloon dilation of urethral stricture and has chronic Foley catheter.  At baseline, he is usually alert and oriented.  Mental status has been declining since last several days.  On presentation he was unresponsive, hypotensive, did not respond to IV fluids.  CT head did not show any acute intracranial abnormalities. Septic shock was suspected. Chest ray showed cardiomegaly, bilateral pleural effusion with consolidation.  Given his advanced age, multiple comorbidities, poor prognosis goals of care were discussed on admission and decision was was made to transition his care to comfort. He passed away this afternoon  Final Diagnoses:  1.   Septic shock   The results of significant diagnostics from this hospitalization (including imaging, microbiology, ancillary and laboratory) are listed below for reference.    Significant Diagnostic Studies: DG Chest 2 View  Result Date: 11/21/2020 CLINICAL DATA:  Weakness, left rib pain after fall several weeks ago EXAM: CHEST - 2 VIEW COMPARISON:  02/14/2019 FINDINGS: Frontal and lateral views of the chest are obtained. Dual lead pacer is again noted and stable. The cardiac silhouette is unremarkable. There is chronic elevation of the left hemidiaphragm. Trace left pleural fluid. No airspace disease or pneumothorax. There are displaced left lateral seventh and eighth rib fractures noted. No other acute bony  abnormalities. IMPRESSION: 1. Minimally displaced left lateral seventh and eighth rib fractures. 2. Trace left pleural effusion. Electronically Signed   By: Randa Ngo M.D.   On: 11/21/2020 16:02   CT HEAD WO CONTRAST (5MM)  Result Date: 2020/12/20 CLINICAL DATA:  Altered mental status EXAM: CT HEAD WITHOUT CONTRAST TECHNIQUE: Contiguous axial images were obtained from the base of the skull through the vertex without intravenous contrast. COMPARISON:  11/05/2020 FINDINGS: Brain: No evidence of acute infarction, hemorrhage, hydrocephalus, extra-axial collection or mass lesion/mass effect. Mild periventricular white matter hypodensity. Vascular: No hyperdense vessel or unexpected calcification. Skull: Normal. Negative for fracture or focal lesion. Sinuses/Orbits: No acute finding. Other: None. IMPRESSION: No acute intracranial pathology. Small-vessel white matter disease. Electronically Signed   By: Eddie Candle M.D.   On: 2020/12/20 14:30   US Renal  Result Date: 11/21/2020 CLINICAL DATA:  Renal failure EXAM: RENAL / URINARY TRACT ULTRASOUND COMPLETE COMPARISON:  CT abdomen and pelvis 12/10/2016 FINDINGS: Right Kidney: Renal measurements: 8.6 x 4.7 x 4.6 cm = volume: 96 mL. Small circumscribed cysts are demonstrated in the upper pole measuring up to 1.3 cm maximal diameter. No significant parenchymal thinning or hydronephrosis. Left Kidney: Renal measurements: 9.3 x 5 x 5.1 cm = volume: 123 mL. 2.4 cm diameter cyst in the midpole was also present on prior CT. No significant parenchymal thinning or hydronephrosis. Bladder: There is heterogeneous bladder wall thickening with trabeculation likely indicating chronic outflow obstruction. Echogenic structure in the base of the bladder corresponds to abnormality seen at prior CT. There are surgical clips in the area suggesting this could be postoperative. Alternatively, a chronic calcified stone or polypoid structure could have this appearance. Mild diffuse  internal echoes within the bladder suggesting debris. This could represent  concentrated urine, hemorrhage, or infection. Correlate with urinalysis. A chronic finding of the echogenic process suggest benign etiology but if stone is suspected, cystoscopy may be useful in further evaluation. Other: None. IMPRESSION: 1. No evidence of hydronephrosis in either kidney. 2. Small bilateral renal cysts. 3. Bladder wall thickening with trabeculation suggesting chronic outflow obstruction. Nonspecific large echogenic structure demonstrated in the base of the bladder which was present on prior CT from 12/10/2016. Diffuse internal echoes in the urine. Correlate with urinalysis. Consider urology consultation. Electronically Signed   By: Lucienne Capers M.D.   On: 11/21/2020 17:55   US Venous Img Upper Uni Left (DVT)  Result Date: 11/27/2020 CLINICAL DATA:  87 year old male with left arm swelling and redness. EXAM: UPPER EXTREMITY VENOUS DOPPLER ULTRASOUND TECHNIQUE: Gray-scale sonography with graded compression, as well as color Doppler and duplex ultrasound were performed to evaluate the upper extremity deep venous system from the level of the subclavian vein and including the jugular, axillary, basilic, radial, ulnar and upper cephalic vein. Spectral Doppler was utilized to evaluate flow at rest and with distal augmentation maneuvers. COMPARISON:  None. FINDINGS: Contralateral Subclavian Vein: Respiratory phasicity is normal and symmetric with the symptomatic side. No evidence of thrombus. Normal compressibility. Internal Jugular Vein: No evidence of thrombus. Normal compressibility, respiratory phasicity and response to augmentation. Subclavian Vein: No evidence of thrombus. Normal compressibility, respiratory phasicity and response to augmentation. Axillary Vein: Nonocclusive, subacute appearing thrombus is present. Cephalic Vein: No evidence of thrombus. Normal compressibility, respiratory phasicity and response to  augmentation. Basilic Vein: No evidence of thrombus. Normal compressibility, respiratory phasicity and response to augmentation. Brachial Veins: No evidence of thrombus. Normal compressibility, respiratory phasicity and response to augmentation. Radial Veins: No evidence of thrombus. Normal compressibility, respiratory phasicity and response to augmentation. Ulnar Veins: No evidence of thrombus. Normal compressibility, respiratory phasicity and response to augmentation. Other Findings: Subcutaneous edema is noted in the right upper extremity. IMPRESSION: Nonocclusive, subacute appearing thrombus limited the left axillary vein. Ruthann Cancer, MD [facility] Electronically Signed   By: Ruthann Cancer M.D.   On: 11/27/2020 12:23   DG Chest Portable 1 View  Result Date: 11/29/2020 CLINICAL DATA:  Altered mental status EXAM: PORTABLE CHEST 1 VIEW COMPARISON:  11/21/2020 FINDINGS: Cardiomegaly with left chest multi lead pacer. Layering bilateral pleural effusions associated atelectasis or consolidation. IMPRESSION: 1.  Cardiomegaly. 2. Layering bilateral pleural effusions and associated atelectasis or consolidation. Electronically Signed   By: Eddie Candle M.D.   On: 12/22/2020 13:58    Microbiology: Recent Results (from the past 240 hour(s))  SARS CORONAVIRUS 2 (TAT 6-24 HRS) Nasopharyngeal     Status: None   Collection Time: 11/28/20  2:38 PM   Specimen: Nasopharyngeal  Result Value Ref Range Status   SARS Coronavirus 2 NEGATIVE NEGATIVE Final    Comment: (NOTE) SARS-CoV-2 target nucleic acids are NOT DETECTED.  The SARS-CoV-2 RNA is generally detectable in upper and lower respiratory specimens during the acute phase of infection. Negative results do not preclude SARS-CoV-2 infection, do not rule out co-infections with other pathogens, and should not be used as the sole basis for treatment or other patient management decisions. Negative results  must be combined with clinical observations, patient history, and epidemiological information. The expected result is Negative.  Fact Sheet for Patients: SugarRoll.be  Fact Sheet for Healthcare Providers: https://www.woods-mathews.com/  This test is not yet approved or cleared by the [place] FDA and  has been authorized for detection and/or  diagnosis of SARS-CoV-2 by FDA under an Emergency Use Authorization (EUA). This EUA will remain  in effect (meaning this test can be used) for the duration of the COVID-19 declaration under Se ction 564(b)(1) of the Act, 21 U.S.C. section 360bbb-3(b)(1), unless the authorization is terminated or revoked sooner.  Performed at [facility], [place] [address], [place], [place] [address]   Blood culture (single)     Status: None (Preliminary result)   Collection Time: 12/10/2020  2:42 PM   Specimen: BLOOD  Result Value Ref Range Status   Specimen Description BLOOD BLOOD LEFT FOREARM  Final   Special Requests   Final    BOTTLES DRAWN AEROBIC AND ANAEROBIC Blood Culture results may not be optimal due to an inadequate volume of blood received in culture bottles   Culture   Final    NO GROWTH < 24 HOURS Performed at [facility], [address], [place], [place] [address]    Report Status PENDING  Incomplete  Culture, blood (single)     Status: None (Preliminary result)   Collection Time: 12/26/2020  2:42 PM   Specimen: BLOOD  Result Value Ref Range Status   Specimen Description BLOOD BLOOD RIGHT HAND  Final   Special Requests   Final    BOTTLES DRAWN AEROBIC AND ANAEROBIC Blood Culture adequate volume   Culture   Final    NO GROWTH < 24 HOURS Performed at [facility], [address] [place], [place] [address]    Report Status PENDING  Incomplete  Resp Panel by RT-PCR (Flu A&B, Covid) Nasopharyngeal Swab     Status: None   Collection Time: 12/17/2020  4:17 PM   Specimen:  Nasopharyngeal Swab; Nasopharyngeal(NP) swabs in vial transport medium  Result Value Ref Range Status   SARS Coronavirus 2 by RT PCR NEGATIVE NEGATIVE Final    Comment: (NOTE) SARS-CoV-2 target nucleic acids are NOT DETECTED.  The SARS-CoV-2 RNA is generally detectable in upper respiratory specimens during the acute phase of infection. The lowest concentration of SARS-CoV-2 viral copies this assay can detect is 138 copies/mL. A negative result does not preclude SARS-Cov-2 infection and should not be used as the sole basis for treatment or other patient management decisions. A negative result may occur with  improper specimen collection/handling, submission of specimen other than nasopharyngeal swab, presence of viral mutation(s) within the areas targeted by this assay, and inadequate number of viral copies(<138 copies/mL). A negative result must be combined with clinical observations, patient history, and epidemiological information. The expected result is Negative.  Fact Sheet for Patients:  EntrepreneurPulse.com.au  Fact Sheet for Healthcare Providers:  IncredibleEmployment.be  This test is no t yet approved or cleared by the [place] FDA and  has been authorized for detection and/or diagnosis of SARS-CoV-2 by FDA under an Emergency Use Authorization (EUA). This EUA will remain  in effect (meaning this test can be used) for the duration of the COVID-19 declaration under Section 564(b)(1) of the Act, 21 U.S.C.section 360bbb-3(b)(1), unless the authorization is terminated  or revoked sooner.       Influenza A by PCR NEGATIVE NEGATIVE Final   Influenza B by PCR NEGATIVE NEGATIVE Final    Comment: (NOTE) The Xpert Xpress SARS-CoV-2/FLU/RSV plus assay is intended as an aid in the diagnosis of influenza from Nasopharyngeal swab specimens and should not be used as a sole basis for treatment. Nasal washings and aspirates are unacceptable for  Xpert Xpress SARS-CoV-2/FLU/RSV testing.  Fact Sheet for  Patients: EntrepreneurPulse.com.au  Fact Sheet for Healthcare Providers: IncredibleEmployment.be  This test is not yet approved or cleared by the [place] FDA and has been authorized for detection and/or diagnosis of SARS-CoV-2 by FDA under an Emergency Use Authorization (EUA). This EUA will remain in effect (meaning this test can be used) for the duration of the COVID-19 declaration under Section 564(b)(1) of the Act, 21 U.S.C. section 360bbb-3(b)(1), unless the authorization is terminated or revoked.  Performed at [facility], [address] [place], [place] [address]      Labs: Basic Metabolic Panel: Recent Labs  Lab 12/08/2020 1255  NA 139  K 4.9  CL 113*  CO2 20*  GLUCOSE 111*  BUN 60*  CREATININE 4.57*  CALCIUM 8.6*   Liver Function Tests: No results for input(s): AST, ALT, ALKPHOS, BILITOT, PROT, ALBUMIN in the last 168 hours. No results for input(s): LIPASE, AMYLASE in the last 168 hours. No results for input(s): AMMONIA in the last 168 hours. CBC: Recent Labs  Lab 12/18/2020 1255  WBC 7.1  NEUTROABS 5.5  HGB 9.9*  HCT 33.2*  MCV 108.9*  PLT 237   Cardiac Enzymes: No results for input(s): CKTOTAL, CKMB, CKMBINDEX, TROPONINI in the last 168 hours. D-Dimer No results for input(s): DDIMER in the last 72 hours. BNP: Invalid input(s): POCBNP CBG: No results for input(s): GLUCAP in the last 168 hours. Anemia work up No results for input(s): VITAMINB12, FOLATE, FERRITIN, TIBC, IRON, RETICCTPCT in the last 72 hours. Urinalysis    Component Value Date/Time   COLORURINE YELLOW (A) 11/21/2020 1600   APPEARANCEUR HAZY (A) 11/21/2020 1600   APPEARANCEUR Cloudy (A) 10/24/2020 1309   LABSPEC 1.009 11/21/2020 1600   PHURINE 6.0 11/21/2020 1600   GLUCOSEU NEGATIVE 11/21/2020 1600   HGBUR MODERATE (A) 11/21/2020 1600   BILIRUBINUR NEGATIVE 11/21/2020  1600   BILIRUBINUR Negative 10/24/2020 1309   KETONESUR NEGATIVE 11/21/2020 1600   PROTEINUR 30 (A) 11/21/2020 1600   UROBILINOGEN 0.2 09/11/2020 1507   NITRITE NEGATIVE 11/21/2020 1600   LEUKOCYTESUR LARGE (A) 11/21/2020 1600   Sepsis Labs Invalid input(s): PROCALCITONIN,  WBC,  LACTICIDVEN     SIGNED:  Shelly Coss, MD  [facility] Dec 30, 2020, 2:05 PM Pager [phone]  If 7PM-7AM, please contact night-coverage [email or web address] Password [email or web address]

## 2020-12-28 NOTE — Progress Notes (Signed)
PROGRESS NOTE    [name] [name]  LFY:[id number] DOB: 08-07-1922 DOA: 12/05/2020 PCP: Owens Loffler, MD   Chief Complain:  87 year old male with history of CKD stage IV, hypertension, hyperlipidemia, GERD, hypothyroidism, depression, prostate cancer s/p radiation/brachytherapy, A. fib on Coumadin, anemia, status post pacemaker, urethral stricture who presented with altered mental status from skilled nursing facility.  He was recently hospitalized, had cystoscopy with balloon dilation of urethral stricture and has chronic Foley catheter.  At baseline, he is usually alert and oriented.  Mental status has been declining since last several days.  On presentation he was unresponsive, hypotensive, did not respond to IV fluids.  CT head did not show any acute intracranial abnormalities.  Chest ray showed cardiomegaly, bilateral pleural effusion with consolidation.  Given his advanced age, multiple comorbidities, poor prognosis goals of care were discussed on admission and decision was was made to transition his care to comfort.  Palliative care consulted.  Brief Narrative:  Septic shock: Presented with hypotension, altered mental status, chest x-ray showing bilateral pleural effusion with consolidation.  Did not respond to IV fluid, steroid.  Currently on comfort care.  Goals of care: Palliative care has been consulted.  Goal is comfort care.  No lab works, IV fluids.  Nurse may pronounce death. Started on morphine drip.  Continue other comfort care medications  Other medical problems: Hypothyroidism, hyperlipidemia, hypertension, A. fib, GERD, CKD    Assessment & Plan:   Principal Problem:   Comfort measures only status Active Problems:   Hypothyroidism   Hyperlipidemia   Essential hypertension   Atrial fibrillation ([name])   GERD   Chronic kidney disease (CKD), stage IV (severe) ([name])   Severe sepsis with septic shock ([name])   HCAP (healthcare-associated pneumonia)   Macrocytic anemia    Depression   Pleural effusion   Acute metabolic encephalopathy   Comatose ([place])   Hypothermia          DVT prophylaxis:None Code Status: Comfort care Family Communication: Daughter at the bedside Status is: Inpatient  Remains inpatient appropriate because:Inpatient level of care appropriate due to severity of illness  Dispo: The patient is from: SNF              Anticipated d/c is to: Hospital death              Patient currently is not medically stable to d/c.   Difficult to place patient No     Consultants: Palliative care  Procedures:None  Antimicrobials:  Anti-infectives (From admission, onward)    Start     Dose/Rate Route Frequency Ordered Stop   12-11-20 1800  ceFEPIme (MAXIPIME) 1 g in sodium chloride 0.9 % 100 mL IVPB  Status:  Discontinued        1 g 200 mL/hr over 30 Minutes Intravenous Every 24 hours 12/01/2020 1616 11/29/2020 1618   2020-12-11 1500  ceFEPIme (MAXIPIME) 1 g in sodium chloride 0.9 % 100 mL IVPB  Status:  Discontinued        1 g 200 mL/hr over 30 Minutes Intravenous Every 24 hours 12/04/2020 1713 12/09/2020 1741   12/05/2020 2000  linezolid (ZYVOX) IVPB 600 mg  Status:  Discontinued        600 mg 300 mL/hr over 60 Minutes Intravenous Every 12 hours 12/25/2020 1707 12/26/2020 1741   12/19/2020 1700  vancomycin (VANCOREADY) IVPB 500 mg/100 mL  Status:  Discontinued        500 mg 100 mL/hr over 60 Minutes Intravenous  Once 12/12/2020 1616  12/26/2020 1618   11/28/2020 1617  vancomycin variable dose per unstable renal function (pharmacist dosing)  Status:  Discontinued         Does not apply See admin instructions 12/22/2020 1618 12/05/2020 1723   12/04/2020 1445  ceFEPIme (MAXIPIME) 2 g in sodium chloride 0.9 % 100 mL IVPB        2 g 200 mL/hr over 30 Minutes Intravenous  Once 11/28/2020 1436 12/10/2020 1524   12/13/2020 1445  vancomycin (VANCOCIN) IVPB 1000 mg/200 mL premix        1,000 mg 200 mL/hr over 60 Minutes Intravenous  Once 12/12/2020 1436 12/17/2020 1603        Subjective:  Patient seen and examined at the bedside this morning.  Daughter at the bedside.  Unresponsive, responding to only verbal/painful stimuli.  Eyes closed.  He looks uncomfortable , mildly agitated, crackles could be heard.  After discussion with the daughter, we discontinued the IV morphine process and started on morphine drip.  Daughter wanted him to be comfortable as much as possible  Objective: Vitals:   11/30/2020 1730 12/25/2020 1745 12/22/2020 2013 2020-12-28 0538  BP: (!) 79/43 (!) 89/72 (!) 79/44 98/73  Pulse: 60 60 62 62  Resp: 19 (!) 23 18 18   Temp: (!) 94.6 F (34.8 C) (!) 94.9 F (34.9 C)    TempSrc:      SpO2: 95% 96% 91% 97%  Weight:      Height:        Intake/Output Summary (Last 24 hours) at December 28, 2020 0740 Last data filed at 12/13/2020 1801 Gross per 24 hour  Intake 2569.67 ml  Output --  Net 2569.67 ml   Filed Weights   12/10/2020 1255  Weight: 70.3 kg    Examination:  General exam: Mildly agitated, appears uncomfortable, unresponsive, HEENT: Eyes closed Respiratory system: Bilateral crackles Cardiovascular system: S1 & S2 heard, RRR.  Gastrointestinal system: Abdomen is nondistended, soft and nontender. Central nervous system: Not alert or awake Extremities: No edema, no clubbing ,no cyanosis Skin: No rashes, no ulcers,no icterus       Data Reviewed: I have personally reviewed following labs and imaging studies  CBC: Recent Labs  Lab 12/06/20 1255  WBC 7.1  NEUTROABS 5.5  HGB 9.9*  HCT 33.2*  MCV 108.9*  PLT 884   Basic Metabolic Panel: Recent Labs  Lab 12/06/20 1255  NA 139  K 4.9  CL 113*  CO2 20*  GLUCOSE 111*  BUN 60*  CREATININE 4.57*  CALCIUM 8.6*   GFR: Estimated Creatinine Clearance: 9.4 mL/min (A) (by C-G formula based on SCr of 4.57 mg/dL (H)). Liver Function Tests: No results for input(s): AST, ALT, ALKPHOS, BILITOT, PROT, ALBUMIN in the last 168 hours. No results for input(s): LIPASE, AMYLASE in the last 168  hours. No results for input(s): AMMONIA in the last 168 hours. Coagulation Profile: Recent Labs  Lab 12/06/20 1255  INR 3.0*   Cardiac Enzymes: No results for input(s): CKTOTAL, CKMB, CKMBINDEX, TROPONINI in the last 168 hours. BNP (last 3 results) No results for input(s): PROBNP in the last 8760 hours. HbA1C: No results for input(s): HGBA1C in the last 72 hours. CBG: No results for input(s): GLUCAP in the last 168 hours. Lipid Profile: No results for input(s): CHOL, HDL, LDLCALC, TRIG, CHOLHDL, LDLDIRECT in the last 72 hours. Thyroid Function Tests: No results for input(s): TSH, T4TOTAL, FREET4, T3FREE, THYROIDAB in the last 72 hours. Anemia Panel: No results for input(s): VITAMINB12, FOLATE, FERRITIN, TIBC, IRON, RETICCTPCT  in the last 72 hours. Sepsis Labs: Recent Labs  Lab 12/21/2020 1255 12/27/2020 1442 12/12/2020 1617  PROCALCITON <0.10  --   --   LATICACIDVEN  --  0.6 0.5    Recent Results (from the past 240 hour(s))  SARS CORONAVIRUS 2 (TAT 6-24 HRS) Nasopharyngeal     Status: None   Collection Time: 11/28/20  2:38 PM   Specimen: Nasopharyngeal  Result Value Ref Range Status   SARS Coronavirus 2 NEGATIVE NEGATIVE Final    Comment: (NOTE) SARS-CoV-2 target nucleic acids are NOT DETECTED.  The SARS-CoV-2 RNA is generally detectable in upper and lower respiratory specimens during the acute phase of infection. Negative results do not preclude SARS-CoV-2 infection, do not rule out co-infections with other pathogens, and should not be used as the sole basis for treatment or other patient management decisions. Negative results must be combined with clinical observations, patient history, and epidemiological information. The expected result is Negative.  Fact Sheet for Patients: SugarRoll.be  Fact Sheet for Healthcare Providers: https://www.woods-mathews.com/  This test is not yet approved or cleared by the [place] FDA  and  has been authorized for detection and/or diagnosis of SARS-CoV-2 by FDA under an Emergency Use Authorization (EUA). This EUA will remain  in effect (meaning this test can be used) for the duration of the COVID-19 declaration under Se ction 564(b)(1) of the Act, 21 U.S.C. section 360bbb-3(b)(1), unless the authorization is terminated or revoked sooner.  Performed at [facility], [place] [address], [place], [place] [address]   Blood culture (single)     Status: None (Preliminary result)   Collection Time: 11/28/2020  2:42 PM   Specimen: BLOOD  Result Value Ref Range Status   Specimen Description BLOOD BLOOD LEFT FOREARM  Final   Special Requests   Final    BOTTLES DRAWN AEROBIC AND ANAEROBIC Blood Culture results may not be optimal due to an inadequate volume of blood received in culture bottles   Culture   Final    NO GROWTH < 24 HOURS Performed at [facility], [address] [place],  [address]    Report Status PENDING  Incomplete  Culture, blood (single)     Status: None (Preliminary result)   Collection Time: 12/23/2020  2:42 PM   Specimen: BLOOD  Result Value Ref Range Status   Specimen Description BLOOD BLOOD RIGHT HAND  Final   Special Requests   Final    BOTTLES DRAWN AEROBIC AND ANAEROBIC Blood Culture adequate volume   Culture   Final    NO GROWTH < 24 HOURS Performed at [facility], [address] [place],  [address]    Report Status PENDING  Incomplete  Resp Panel by RT-PCR (Flu A&B, Covid) Nasopharyngeal Swab     Status: None   Collection Time: 12/06/20  4:17 PM   Specimen: Nasopharyngeal Swab; Nasopharyngeal(NP) swabs in vial transport medium  Result Value Ref Range Status   SARS Coronavirus 2 by RT PCR NEGATIVE NEGATIVE Final    Comment: (NOTE) SARS-CoV-2 target nucleic acids are NOT DETECTED.  The SARS-CoV-2 RNA is generally detectable in upper respiratory specimens during the acute phase of infection. The  lowest concentration of SARS-CoV-2 viral copies this assay can detect is 138 copies/mL. A negative result does not preclude SARS-Cov-2 infection and should not be used as the sole basis for treatment or other patient management decisions. A negative result may occur with  improper specimen collection/handling, submission of specimen other than nasopharyngeal  swab, presence of viral mutation(s) within the areas targeted by this assay, and inadequate number of viral copies(<138 copies/mL). A negative result must be combined with clinical observations, patient history, and epidemiological information. The expected result is Negative.  Fact Sheet for Patients:  EntrepreneurPulse.com.au  Fact Sheet for Healthcare Providers:  IncredibleEmployment.be  This test is no t yet approved or cleared by the [place] FDA and  has been authorized for detection and/or diagnosis of SARS-CoV-2 by FDA under an Emergency Use Authorization (EUA). This EUA will remain  in effect (meaning this test can be used) for the duration of the COVID-19 declaration under Section 564(b)(1) of the Act, 21 U.S.C.section 360bbb-3(b)(1), unless the authorization is terminated  or revoked sooner.       Influenza A by PCR NEGATIVE NEGATIVE Final   Influenza B by PCR NEGATIVE NEGATIVE Final    Comment: (NOTE) The Xpert Xpress SARS-CoV-2/FLU/RSV plus assay is intended as an aid in the diagnosis of influenza from Nasopharyngeal swab specimens and should not be used as a sole basis for treatment. Nasal washings and aspirates are unacceptable for Xpert Xpress SARS-CoV-2/FLU/RSV testing.  Fact Sheet for Patients: EntrepreneurPulse.com.au  Fact Sheet for Healthcare Providers: IncredibleEmployment.be  This test is not yet approved or cleared by the [place] FDA and has been authorized for detection and/or diagnosis of SARS-CoV-2 by FDA under  an Emergency Use Authorization (EUA). This EUA will remain in effect (meaning this test can be used) for the duration of the COVID-19 declaration under Section 564(b)(1) of the Act, 21 U.S.C. section 360bbb-3(b)(1), unless the authorization is terminated or revoked.  Performed at [facility], [address] [place], [place] [address]          Radiology Studies: CT HEAD WO CONTRAST (5MM)  Result Date: 12/21/2020 CLINICAL DATA:  Altered mental status EXAM: CT HEAD WITHOUT CONTRAST TECHNIQUE: Contiguous axial images were obtained from the base of the skull through the vertex without intravenous contrast. COMPARISON:  11/05/2020 FINDINGS: Brain: No evidence of acute infarction, hemorrhage, hydrocephalus, extra-axial collection or mass lesion/mass effect. Mild periventricular white matter hypodensity. Vascular: No hyperdense vessel or unexpected calcification. Skull: Normal. Negative for fracture or focal lesion. Sinuses/Orbits: No acute finding. Other: None. IMPRESSION: No acute intracranial pathology. Small-vessel white matter disease. Electronically Signed   By: Eddie Candle M.D.   On: 12/20/2020 14:30   DG Chest Portable 1 View  Result Date: 12/20/2020 CLINICAL DATA:  Altered mental status EXAM: PORTABLE CHEST 1 VIEW COMPARISON:  11/21/2020 FINDINGS: Cardiomegaly with left chest multi lead pacer. Layering bilateral pleural effusions associated atelectasis or consolidation. IMPRESSION: 1.  Cardiomegaly. 2. Layering bilateral pleural effusions and associated atelectasis or consolidation. Electronically Signed   By: Eddie Candle M.D.   On: 12/06/2020 13:58        Scheduled Meds:  scopolamine  1 patch Transdermal Q72H   Continuous Infusions:   LOS: 1 day    Time spent: 25 mins,More than 50% of that time was spent in counseling and/or coordination of care.      Shelly Coss, MD [facility] P19-Sep-2022, 7:40 AM

## 2020-12-28 NOTE — Progress Notes (Signed)
Nutrition Brief Note  Chart reviewed due to MST score of 3. Patient now transitioning to comfort care.  No nutrition interventions planned at this time.  Please re-consult as needed.   Lucas Mallow, RD, LDN, CNSC Please refer to [facility] for contact information.

## 2020-12-28 DEATH — deceased

## 2021-01-14 ENCOUNTER — Telehealth: Payer: Self-pay

## 2021-01-14 NOTE — Telephone Encounter (Signed)
I called the patient daughter and left a message on her voicemail to call us back. I need to verify the address to send the return kit to.

## 2021-01-14 NOTE — Telephone Encounter (Signed)
Patient daughter called back to confirm the address left on the vm was correct and I let patient know we will send out a return kit

## 2021-01-14 NOTE — Telephone Encounter (Signed)
-----   Message from Simone Curia, RN sent at 01/13/2021 11:31 AM EDT ----- Hello,  Patient daughter calling.  Patient has passed away and he had a pacemaker with device checks, would like to know what they are to do with the devices.  Please call to discuss or leave detailed VM at [phone]. Thank you.

## 2021-04-23 ENCOUNTER — Ambulatory Visit: Payer: Medicare Other | Admitting: Family Medicine

## 2021-06-26 ENCOUNTER — Ambulatory Visit: Payer: Medicare Other

## 2022-05-20 IMAGING — US US RENAL
1 series · 13 of 25 positions shown · non-contrast
Comparison: CT abdomen and pelvis 12/10/2016

CLINICAL DATA: Renal failure

EXAM:
RENAL / URINARY TRACT ULTRASOUND COMPLETE

[Series 1: us renal · 50 acquisitions, 13 frames shown]
[im 1/50]
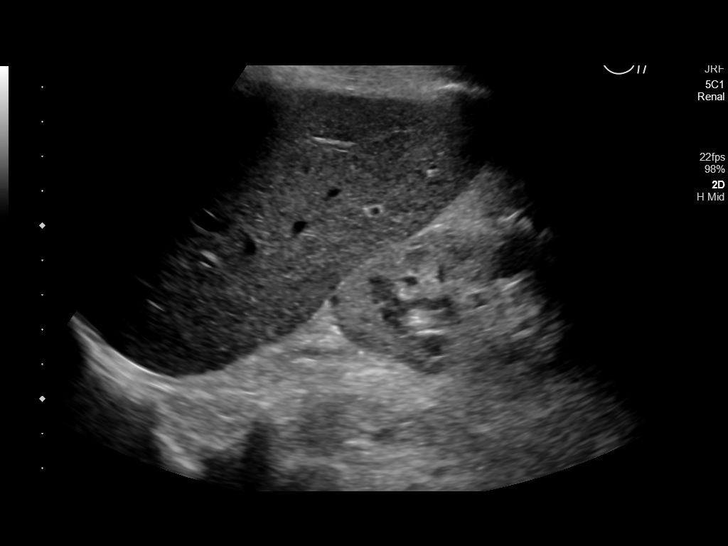
[im 5/50]
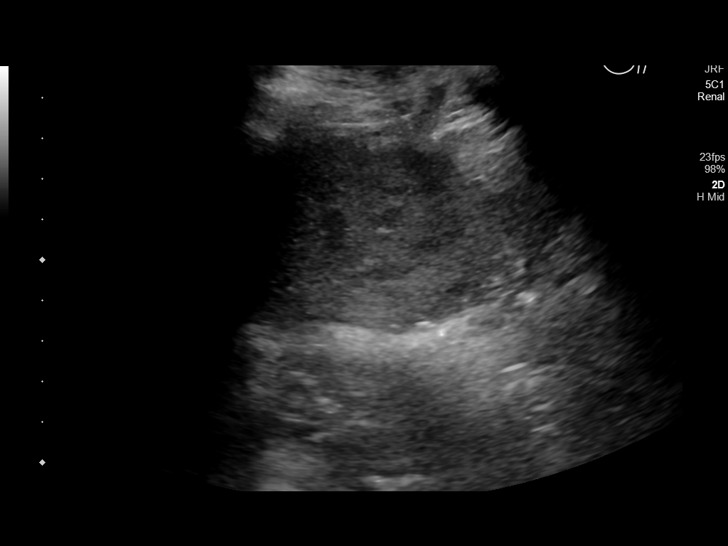
[im 9/50]
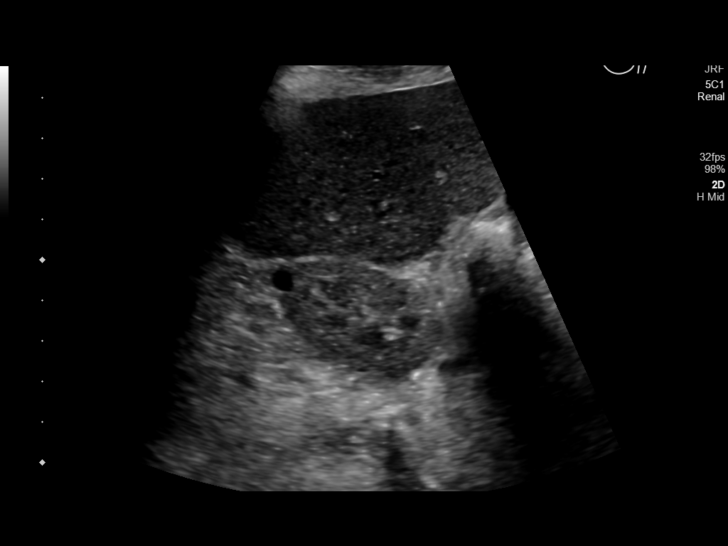
[im 13/50]
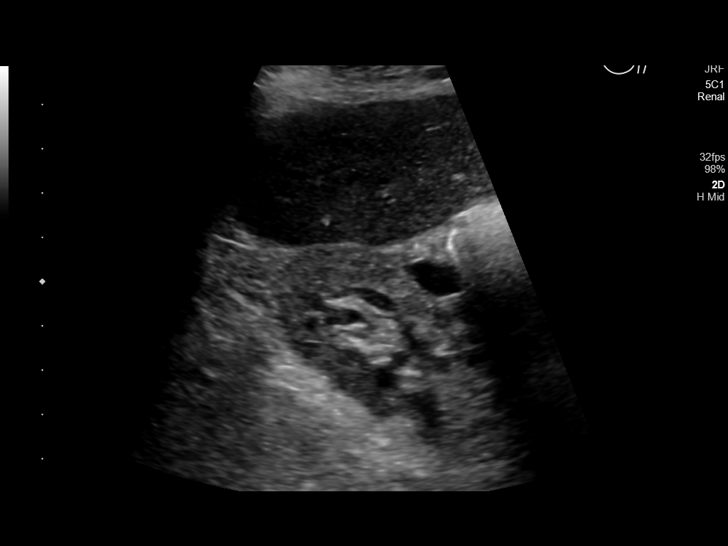
[im 17/50]
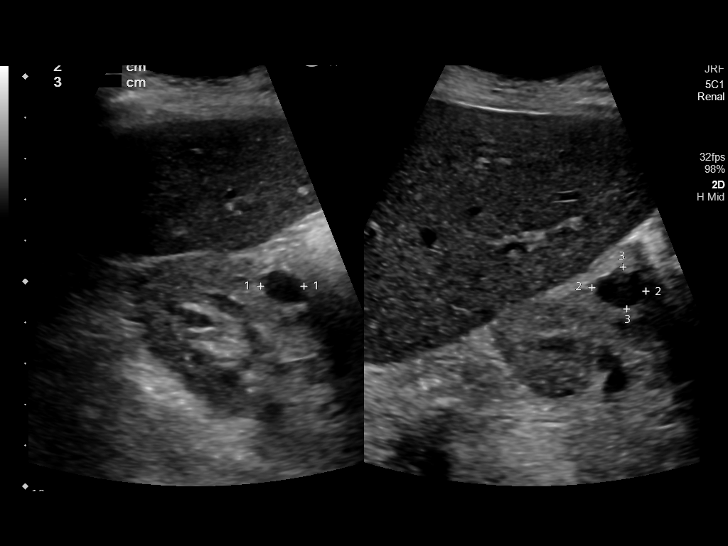
[im 21/50]
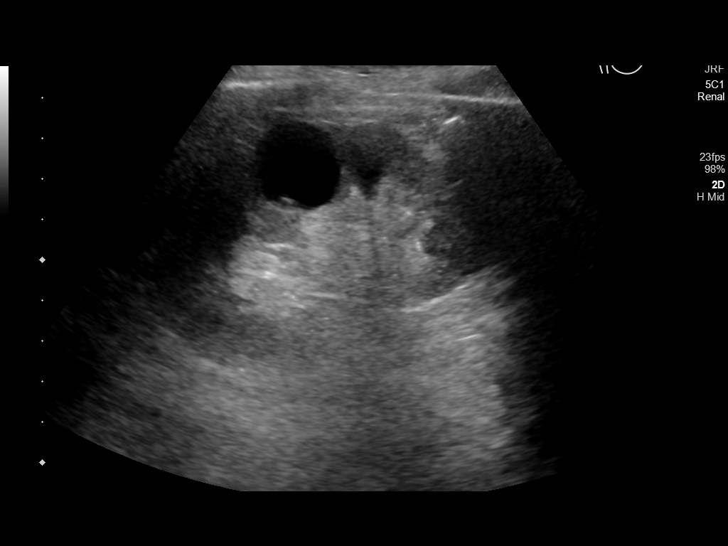
[im 25/50]
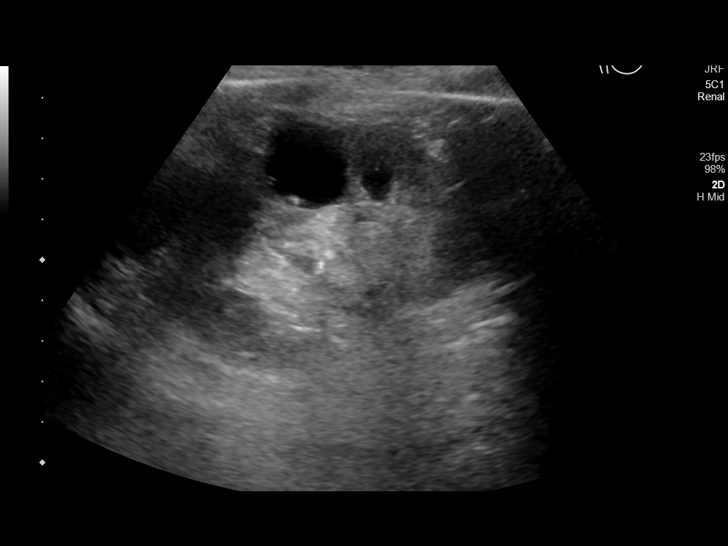
[im 29/50]
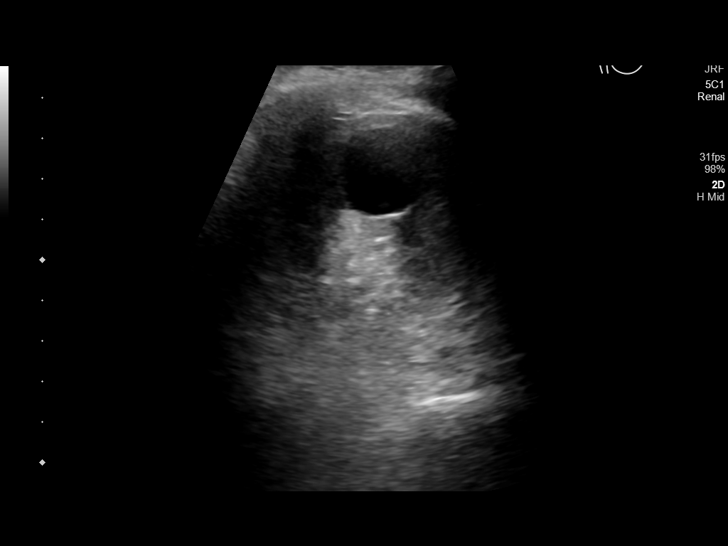
[im 33/50]
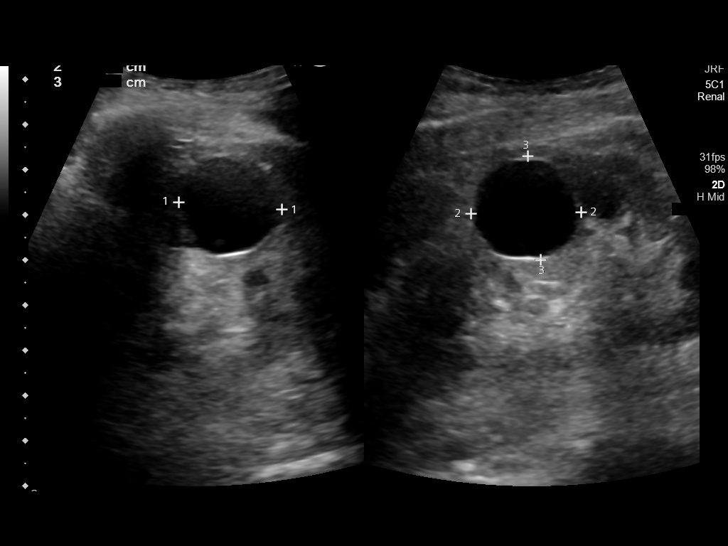
[im 37/50]
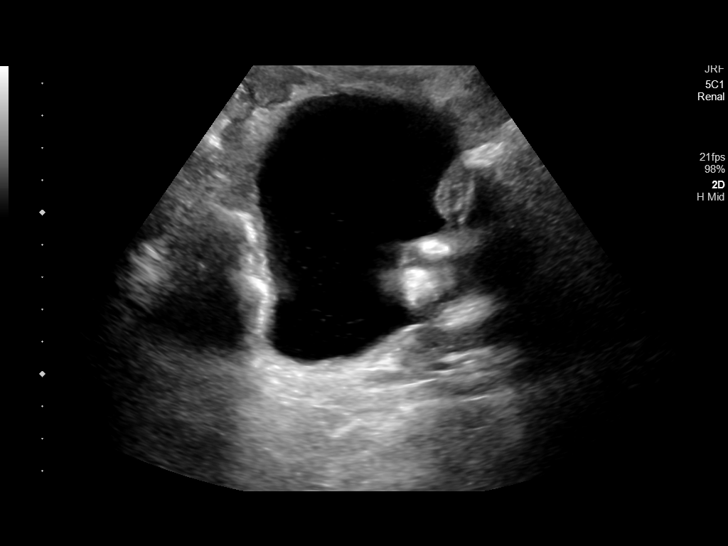
[im 41/50]
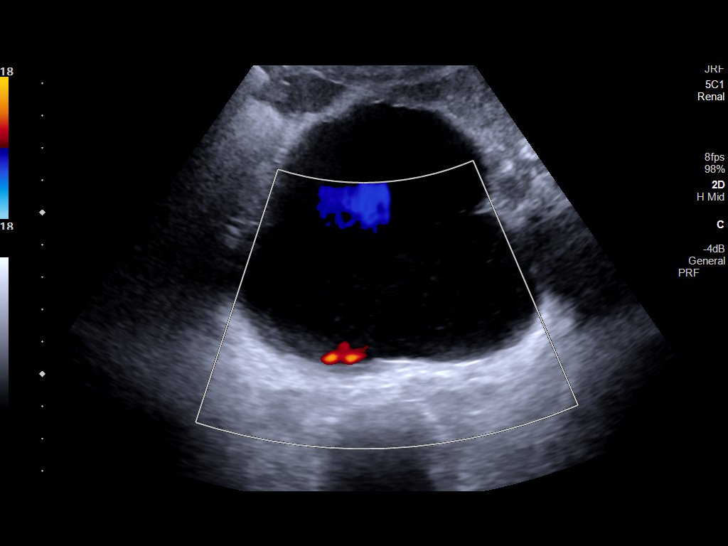
[im 45/50]
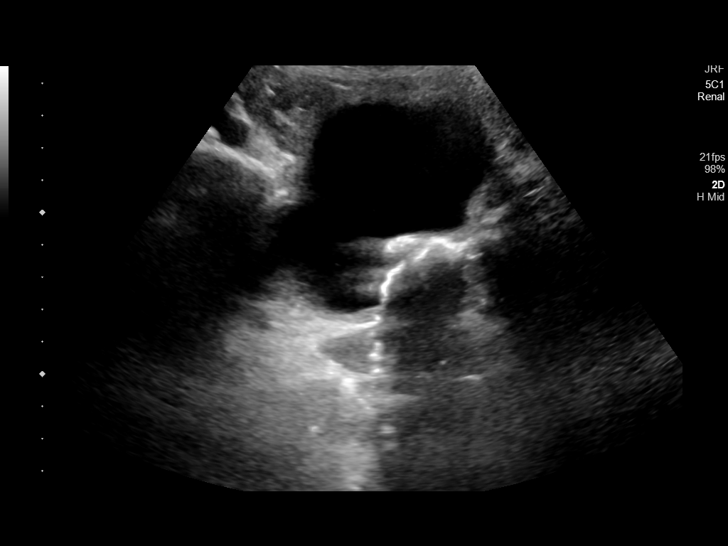
[im 50/50]
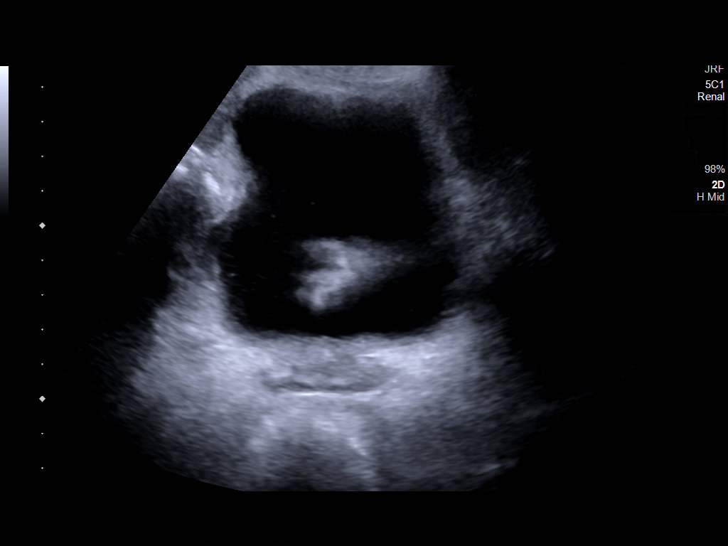

[13 of 25 positions shown; findings below may reference images not displayed]

FINDINGS: Right Kidney:

Renal measurements: 8.6 x 4.7 x 4.6 cm = volume: 96 mL. Small
circumscribed cysts are demonstrated in the upper pole measuring up
to 1.3 cm maximal diameter. No significant parenchymal thinning or
hydronephrosis.

Left Kidney:

Renal measurements: 9.3 x 5 x 5.1 cm = volume: 123 mL. 2.4 cm
diameter cyst in the midpole was also present on prior CT. No
significant parenchymal thinning or hydronephrosis.

Bladder:

There is heterogeneous bladder wall thickening with trabeculation
likely indicating chronic outflow obstruction. Echogenic structure
in the base of the bladder corresponds to abnormality seen at prior
CT. There are surgical clips in the area suggesting this could be
postoperative. Alternatively, a chronic calcified stone or polypoid
structure could have this appearance. Mild diffuse internal echoes
within the bladder suggesting debris. This could represent
concentrated urine, hemorrhage, or infection. Correlate with
urinalysis. A chronic finding of the echogenic process suggest
benign etiology but if stone is suspected, cystoscopy may be useful
in further evaluation.

Other:

None.
IMPRESSION: 1. No evidence of hydronephrosis in either kidney.
2. Small bilateral renal cysts.
3. Bladder wall thickening with trabeculation suggesting chronic
outflow obstruction. Nonspecific large echogenic structure
demonstrated in the base of the bladder which was present on prior
CT from 12/10/2016. Diffuse internal echoes in the urine. Correlate
with urinalysis. Consider urology consultation.
